# Patient Record
Sex: Male | Born: 1945 | Race: White | Hispanic: No | Marital: Single | Smoking: Former smoker
Health system: Southern US, Community
[De-identification: ages and names within clinical notes are randomized; demographics above are authoritative.]

## PROBLEM LIST (undated history)

## (undated) DIAGNOSIS — D649 Anemia, unspecified: Secondary | ICD-10-CM

## (undated) DIAGNOSIS — G629 Polyneuropathy, unspecified: Secondary | ICD-10-CM

## (undated) DIAGNOSIS — E785 Hyperlipidemia, unspecified: Secondary | ICD-10-CM

## (undated) DIAGNOSIS — C9000 Multiple myeloma not having achieved remission: Secondary | ICD-10-CM

## (undated) DIAGNOSIS — T07XXXA Unspecified multiple injuries, initial encounter: Secondary | ICD-10-CM

## (undated) DIAGNOSIS — K219 Gastro-esophageal reflux disease without esophagitis: Secondary | ICD-10-CM

## (undated) DIAGNOSIS — Z5189 Encounter for other specified aftercare: Secondary | ICD-10-CM

## (undated) DIAGNOSIS — Z9221 Personal history of antineoplastic chemotherapy: Secondary | ICD-10-CM

## (undated) DIAGNOSIS — IMO0002 Reserved for concepts with insufficient information to code with codable children: Secondary | ICD-10-CM

## (undated) DIAGNOSIS — C61 Malignant neoplasm of prostate: Secondary | ICD-10-CM

## (undated) DIAGNOSIS — I10 Essential (primary) hypertension: Secondary | ICD-10-CM

## (undated) DIAGNOSIS — C801 Malignant (primary) neoplasm, unspecified: Secondary | ICD-10-CM

## (undated) DIAGNOSIS — N08 Glomerular disorders in diseases classified elsewhere: Secondary | ICD-10-CM

## (undated) DIAGNOSIS — N289 Disorder of kidney and ureter, unspecified: Secondary | ICD-10-CM

## (undated) HISTORY — DX: Malignant (primary) neoplasm, unspecified: C80.1

## (undated) HISTORY — DX: Personal history of antineoplastic chemotherapy: Z92.21

## (undated) HISTORY — DX: Essential (primary) hypertension: I10

## (undated) HISTORY — DX: Polyneuropathy, unspecified: G62.9

## (undated) HISTORY — DX: Reserved for concepts with insufficient information to code with codable children: IMO0002

## (undated) HISTORY — DX: Anemia, unspecified: D64.9

## (undated) HISTORY — DX: Unspecified multiple injuries, initial encounter: T07.XXXA

## (undated) HISTORY — DX: Disorder of kidney and ureter, unspecified: N28.9

## (undated) HISTORY — DX: Encounter for other specified aftercare: Z51.89

## (undated) HISTORY — DX: Gastro-esophageal reflux disease without esophagitis: K21.9

## (undated) HISTORY — PX: CATARACT EXTRACTION W/ INTRAOCULAR LENS IMPLANT: SHX1309

## (undated) HISTORY — DX: Malignant neoplasm of prostate: C61

## (undated) HISTORY — PX: KIDNEY STONE SURGERY: SHX686

---

## 2002-03-27 DIAGNOSIS — C61 Malignant neoplasm of prostate: Secondary | ICD-10-CM

## 2002-03-27 HISTORY — DX: Malignant neoplasm of prostate: C61

## 2006-08-02 ENCOUNTER — Ambulatory Visit: Payer: Self-pay | Admitting: Family Medicine

## 2006-08-05 ENCOUNTER — Ambulatory Visit: Payer: Self-pay | Admitting: Family Medicine

## 2006-08-05 LAB — CONVERTED CEMR LAB
Chol/HDL Ratio, serum: 5.5
Cholesterol: 181 mg/dL (ref 0–200)
PSA: 4.62 ng/mL
PSA: 4.62 ng/mL — ABNORMAL HIGH (ref 0.10–4.00)
VLDL: 35 mg/dL (ref 0–40)
Vitamin B-12: 334 pg/mL (ref 211–911)

## 2006-08-19 ENCOUNTER — Ambulatory Visit: Payer: Self-pay | Admitting: Family Medicine

## 2006-09-07 ENCOUNTER — Ambulatory Visit: Payer: Self-pay | Admitting: Gastroenterology

## 2006-09-17 ENCOUNTER — Encounter (INDEPENDENT_AMBULATORY_CARE_PROVIDER_SITE_OTHER): Payer: Self-pay | Admitting: Specialist

## 2006-09-17 ENCOUNTER — Ambulatory Visit: Payer: Self-pay | Admitting: Gastroenterology

## 2006-11-18 ENCOUNTER — Encounter: Payer: Self-pay | Admitting: Family Medicine

## 2006-11-18 DIAGNOSIS — K219 Gastro-esophageal reflux disease without esophagitis: Secondary | ICD-10-CM | POA: Insufficient documentation

## 2006-11-18 DIAGNOSIS — I10 Essential (primary) hypertension: Secondary | ICD-10-CM | POA: Insufficient documentation

## 2006-11-18 DIAGNOSIS — E669 Obesity, unspecified: Secondary | ICD-10-CM

## 2006-11-22 ENCOUNTER — Ambulatory Visit: Payer: Self-pay | Admitting: Family Medicine

## 2006-11-24 ENCOUNTER — Encounter: Payer: Self-pay | Admitting: Family Medicine

## 2006-11-24 LAB — CONVERTED CEMR LAB: PSA, Free Pct: 16 — ABNORMAL LOW (ref >25)

## 2006-11-25 ENCOUNTER — Encounter: Payer: Self-pay | Admitting: Family Medicine

## 2006-11-26 ENCOUNTER — Encounter: Payer: Self-pay | Admitting: Family Medicine

## 2006-12-17 ENCOUNTER — Encounter: Payer: Self-pay | Admitting: Family Medicine

## 2007-01-04 ENCOUNTER — Encounter: Payer: Self-pay | Admitting: Family Medicine

## 2007-01-04 ENCOUNTER — Ambulatory Visit (HOSPITAL_COMMUNITY): Admission: RE | Admit: 2007-01-04 | Discharge: 2007-01-04 | Payer: Self-pay | Admitting: Urology

## 2007-01-10 ENCOUNTER — Encounter: Payer: Self-pay | Admitting: Family Medicine

## 2007-01-20 ENCOUNTER — Ambulatory Visit: Admission: RE | Admit: 2007-01-20 | Discharge: 2007-03-01 | Payer: Self-pay | Admitting: Radiation Oncology

## 2007-01-24 ENCOUNTER — Encounter: Payer: Self-pay | Admitting: Family Medicine

## 2007-02-11 ENCOUNTER — Encounter: Payer: Self-pay | Admitting: Family Medicine

## 2007-02-23 DIAGNOSIS — C61 Malignant neoplasm of prostate: Secondary | ICD-10-CM

## 2007-04-06 ENCOUNTER — Inpatient Hospital Stay (HOSPITAL_COMMUNITY): Admission: RE | Admit: 2007-04-06 | Discharge: 2007-04-07 | Payer: Self-pay | Admitting: Urology

## 2007-04-06 ENCOUNTER — Encounter (INDEPENDENT_AMBULATORY_CARE_PROVIDER_SITE_OTHER): Payer: Self-pay | Admitting: Urology

## 2007-04-06 HISTORY — PX: PROSTATECTOMY: SHX69

## 2007-04-12 ENCOUNTER — Encounter: Payer: Self-pay | Admitting: Family Medicine

## 2007-05-20 ENCOUNTER — Encounter: Payer: Self-pay | Admitting: Family Medicine

## 2007-05-26 ENCOUNTER — Ambulatory Visit: Admission: RE | Admit: 2007-05-26 | Discharge: 2007-07-04 | Payer: Self-pay | Admitting: Radiation Oncology

## 2007-05-30 ENCOUNTER — Encounter: Payer: Self-pay | Admitting: Family Medicine

## 2007-08-05 ENCOUNTER — Encounter: Payer: Self-pay | Admitting: Family Medicine

## 2007-08-15 ENCOUNTER — Ambulatory Visit: Payer: Self-pay | Admitting: Family Medicine

## 2007-08-16 ENCOUNTER — Ambulatory Visit: Payer: Self-pay | Admitting: Family Medicine

## 2007-08-18 DIAGNOSIS — E781 Pure hyperglyceridemia: Secondary | ICD-10-CM

## 2007-08-18 LAB — CONVERTED CEMR LAB
ALT: 30 units/L (ref 0–53)
AST: 28 units/L (ref 0–37)
BUN: 15 mg/dL (ref 6–23)
Bilirubin, Direct: 0.1 mg/dL (ref 0.0–0.3)
Cholesterol: 178 mg/dL (ref 0–200)
GFR calc non Af Amer: 104 mL/min
HDL: 29.5 mg/dL — ABNORMAL LOW (ref >39.0)
Potassium: 4.8 meq/L (ref 3.5–5.1)
Total Bilirubin: 1.1 mg/dL (ref 0.3–1.2)
Total CHOL/HDL Ratio: 6
Total Protein: 6.7 g/dL (ref 6.0–8.3)
Triglycerides: 240 mg/dL (ref 0–149)
VLDL: 48 mg/dL — ABNORMAL HIGH (ref 0–40)

## 2007-11-01 ENCOUNTER — Encounter: Payer: Self-pay | Admitting: Family Medicine

## 2007-11-16 ENCOUNTER — Ambulatory Visit: Payer: Self-pay | Admitting: Family Medicine

## 2007-11-18 LAB — CONVERTED CEMR LAB
Direct LDL: 124.3 mg/dL
Total CHOL/HDL Ratio: 6.2
VLDL: 44 mg/dL — ABNORMAL HIGH (ref 0–40)

## 2007-11-21 ENCOUNTER — Telehealth: Payer: Self-pay | Admitting: Family Medicine

## 2008-02-01 ENCOUNTER — Encounter: Payer: Self-pay | Admitting: Family Medicine

## 2008-02-15 ENCOUNTER — Ambulatory Visit: Payer: Self-pay | Admitting: Family Medicine

## 2008-02-16 LAB — CONVERTED CEMR LAB
HDL: 26.3 mg/dL — ABNORMAL LOW (ref >39.0)
LDL Cholesterol: 100 mg/dL — ABNORMAL HIGH (ref 0–99)
Triglycerides: 153 mg/dL — ABNORMAL HIGH (ref 0–149)
VLDL: 31 mg/dL (ref 0–40)

## 2008-03-23 ENCOUNTER — Encounter: Payer: Self-pay | Admitting: Family Medicine

## 2008-05-17 ENCOUNTER — Ambulatory Visit: Payer: Self-pay | Admitting: Family Medicine

## 2008-05-17 DIAGNOSIS — R209 Unspecified disturbances of skin sensation: Secondary | ICD-10-CM | POA: Insufficient documentation

## 2008-05-17 DIAGNOSIS — M79609 Pain in unspecified limb: Secondary | ICD-10-CM

## 2008-05-17 DIAGNOSIS — R109 Unspecified abdominal pain: Secondary | ICD-10-CM | POA: Insufficient documentation

## 2008-05-27 LAB — CONVERTED CEMR LAB
PSA: 0 ng/mL
PSA: 0 ng/mL

## 2008-06-08 ENCOUNTER — Encounter: Payer: Self-pay | Admitting: Family Medicine

## 2008-07-03 ENCOUNTER — Encounter: Payer: Self-pay | Admitting: Family Medicine

## 2008-07-03 ENCOUNTER — Encounter: Admission: RE | Admit: 2008-07-03 | Discharge: 2008-07-03 | Payer: Self-pay | Admitting: Neurology

## 2008-07-09 ENCOUNTER — Encounter: Payer: Self-pay | Admitting: Family Medicine

## 2008-08-10 ENCOUNTER — Ambulatory Visit: Payer: Self-pay | Admitting: Family Medicine

## 2008-08-14 LAB — CONVERTED CEMR LAB
ALT: 33 units/L (ref 0–53)
AST: 31 units/L (ref 0–37)
Cholesterol: 187 mg/dL (ref 0–200)
Total CHOL/HDL Ratio: 6.2

## 2008-08-15 ENCOUNTER — Ambulatory Visit: Payer: Self-pay | Admitting: Family Medicine

## 2008-09-11 ENCOUNTER — Encounter: Payer: Self-pay | Admitting: Family Medicine

## 2008-10-01 ENCOUNTER — Encounter: Payer: Self-pay | Admitting: Family Medicine

## 2008-10-18 ENCOUNTER — Encounter: Payer: Self-pay | Admitting: Family Medicine

## 2008-10-24 ENCOUNTER — Ambulatory Visit: Payer: Self-pay | Admitting: Cardiology

## 2008-12-14 ENCOUNTER — Encounter: Payer: Self-pay | Admitting: Family Medicine

## 2009-01-23 ENCOUNTER — Encounter: Payer: Self-pay | Admitting: Gastroenterology

## 2009-01-23 ENCOUNTER — Encounter: Payer: Self-pay | Admitting: Family Medicine

## 2009-01-29 ENCOUNTER — Encounter: Payer: Self-pay | Admitting: Gastroenterology

## 2009-01-29 DIAGNOSIS — R198 Other specified symptoms and signs involving the digestive system and abdomen: Secondary | ICD-10-CM

## 2009-01-31 ENCOUNTER — Ambulatory Visit: Payer: Self-pay | Admitting: Gastroenterology

## 2009-01-31 ENCOUNTER — Ambulatory Visit (HOSPITAL_COMMUNITY): Admission: RE | Admit: 2009-01-31 | Discharge: 2009-01-31 | Payer: Self-pay | Admitting: Gastroenterology

## 2009-03-04 ENCOUNTER — Encounter: Payer: Self-pay | Admitting: Family Medicine

## 2009-03-05 ENCOUNTER — Telehealth: Payer: Self-pay | Admitting: Family Medicine

## 2009-03-05 ENCOUNTER — Telehealth: Payer: Self-pay | Admitting: Gastroenterology

## 2009-03-07 ENCOUNTER — Encounter: Payer: Self-pay | Admitting: Family Medicine

## 2009-03-07 ENCOUNTER — Encounter: Admission: RE | Admit: 2009-03-07 | Discharge: 2009-03-07 | Payer: Self-pay | Admitting: General Surgery

## 2009-03-12 ENCOUNTER — Telehealth (INDEPENDENT_AMBULATORY_CARE_PROVIDER_SITE_OTHER): Payer: Self-pay | Admitting: *Deleted

## 2009-03-25 ENCOUNTER — Encounter: Payer: Self-pay | Admitting: Family Medicine

## 2009-05-08 ENCOUNTER — Encounter: Payer: Self-pay | Admitting: Family Medicine

## 2009-05-09 ENCOUNTER — Ambulatory Visit (HOSPITAL_COMMUNITY): Admission: RE | Admit: 2009-05-09 | Discharge: 2009-05-09 | Payer: Self-pay | Admitting: General Surgery

## 2009-05-13 ENCOUNTER — Telehealth: Payer: Self-pay | Admitting: Family Medicine

## 2009-05-22 ENCOUNTER — Ambulatory Visit: Payer: Self-pay | Admitting: Family Medicine

## 2009-05-22 DIAGNOSIS — J029 Acute pharyngitis, unspecified: Secondary | ICD-10-CM

## 2009-05-23 ENCOUNTER — Encounter: Payer: Self-pay | Admitting: Family Medicine

## 2009-06-24 ENCOUNTER — Encounter: Payer: Self-pay | Admitting: Family Medicine

## 2009-06-26 HISTORY — PX: KIDNEY STONE SURGERY: SHX686

## 2009-07-05 ENCOUNTER — Encounter: Payer: Self-pay | Admitting: Family Medicine

## 2009-11-18 ENCOUNTER — Encounter: Payer: Self-pay | Admitting: Family Medicine

## 2009-12-06 ENCOUNTER — Encounter: Payer: Self-pay | Admitting: Family Medicine

## 2009-12-19 ENCOUNTER — Encounter: Payer: Self-pay | Admitting: Family Medicine

## 2010-01-03 ENCOUNTER — Encounter: Payer: Self-pay | Admitting: Family Medicine

## 2010-02-02 ENCOUNTER — Encounter: Payer: Self-pay | Admitting: Family Medicine

## 2010-02-03 ENCOUNTER — Telehealth: Payer: Self-pay | Admitting: Family Medicine

## 2010-02-04 ENCOUNTER — Ambulatory Visit: Payer: Self-pay | Admitting: Family Medicine

## 2010-02-04 DIAGNOSIS — H8110 Benign paroxysmal vertigo, unspecified ear: Secondary | ICD-10-CM

## 2010-07-11 ENCOUNTER — Encounter: Payer: Self-pay | Admitting: Family Medicine

## 2010-07-15 ENCOUNTER — Ambulatory Visit: Payer: Self-pay | Admitting: Family Medicine

## 2010-07-15 DIAGNOSIS — J309 Allergic rhinitis, unspecified: Secondary | ICD-10-CM | POA: Insufficient documentation

## 2010-07-17 ENCOUNTER — Telehealth (INDEPENDENT_AMBULATORY_CARE_PROVIDER_SITE_OTHER): Payer: Self-pay | Admitting: *Deleted

## 2010-07-27 DIAGNOSIS — D649 Anemia, unspecified: Secondary | ICD-10-CM

## 2010-07-27 HISTORY — DX: Anemia, unspecified: D64.9

## 2010-08-26 NOTE — Assessment & Plan Note (Signed)
Summary: DIZZINESS/HMW   Vital Signs:  Patient profile:   65 year old male Height:      74 inches Weight:      269.25 pounds BMI:     34.69 Temp:     98.3 degrees F oral Pulse rate:   72 / minute Pulse rhythm:   regular BP sitting:   142 / 80  (left arm) Cuff size:   large  Vitals Entered By: Linde Gillis CMA Duncan Dull) (February 04, 2010 3:52 PM) CC: dizziness   History of Present Illness: In last week...surgery to remove foreign body in perineum.  Outpt surgery. Feels dramatically better since surgery. Surgeon was Dr. Pixie Casino, specialist in [place].  Given percocet for pain...took some...when woke up next day felt dizzy. Resolved after few hours. Returned again.. felt off balance when turning quickly.  Slight headache. Slight nausea , vomiting. No neuro symptoms. No vision change.  No falls. No hearing loss, no ringing in ear.           Problems Prior to Update: 1)  Exudative Pharyngitis  (ICD-462) 2)  Rectal Mass  (ICD-787.99) 3)  Well Adult  (ICD-V70.0) 4)  Paresthesia  (ICD-782.0) 5)  Foot Pain, Bilateral  (ICD-729.5) 6)  Inguinal Pain, Right  (ICD-789.09) 7)  Hypertriglyceridemia  (ICD-272.1) 8)  Screening For Lipoid Disorders  (ICD-V77.91) 9)  Neop, Malignant, Prostate  (ICD-185) 10)  Obesity  (ICD-278.00) 11)  Hypertension  (ICD-401.9) 12)  Gerd  (ICD-530.81)  Current Medications (verified): 1)  Aspirin Low Strength 81 Mg Chew (Aspirin) .... Take 1 Tablet By Mouth Once A Day 2)  Hydrochlorothiazide 12.5 Mg Tabs (Hydrochlorothiazide) .... Take 1 Tablet By Mouth Once A Day 3)  Multivitamins   Tabs (Multiple Vitamin) .... Take 1 Tablet By Mouth Once A Day 4)  Simvastatin 40 Mg Tabs (Simvastatin) .... ? Dosage   Takes 1/2 Tablet By Mouth At Bedtime 5)  Meclizine Hcl 25 Mg Tabs (Meclizine Hcl) .Marland Kitchen.. 1 Tab By Mouth Q6 Hours As Needed Vertigo  Allergies (verified): No Known Drug Allergies  Past History:  Past medical, surgical, family and social  histories (including risk factors) reviewed, and no changes noted (except as noted below).  Past Medical History: Reviewed history from 11/18/2006 and no changes required. GERD Hypertension  Past Surgical History: Reviewed history from 08/15/2007 and no changes required. Kidney stones Radical prostatectomy 04/06/07 Bone Scan neg  Family History: Reviewed history and no changes required.  Social History: Reviewed history from 08/15/2007 and no changes required. Regular exercise-yes, daily walking Diet: healthy  Review of Systems General:  Denies fatigue and fever. CV:  Denies chest pain or discomfort. Resp:  Denies shortness of breath. GI:  Denies abdominal pain. Neuro:  Complains of poor balance and sensation of room spinning; denies falling down, memory loss, and seizures.  Physical Exam  General:  Well-developed,well-nourished,in no acute distress; alert,appropriate and cooperative throughout examination Eyes:  No corneal or conjunctival inflammation noted. EOMI. Perrla. Funduscopic exam benign, without hemorrhages, exudates or papilledema. Vision grossly normal. Ears:  External ear exam shows no significant lesions or deformities.  Otoscopic examination reveals clear canals, tympanic membranes are intact bilaterally without bulging, retraction, inflammation or discharge. Hearing is grossly normal bilaterally. Nose:  External nasal examination shows no deformity or inflammation. Nasal mucosa are pink and moist without lesions or exudates. Mouth:  Oral mucosa and oropharynx without lesions or exudates.  Teeth in good repair. Neck:  no carotid bruit or thyromegaly no cervical or supraclavicular lymphadenopathy  Lungs:  Normal respiratory effort, chest expands symmetrically. Lungs are clear to auscultation, no crackles or wheezes. Heart:  Normal rate and regular rhythm. S1 and S2 normal without gallop, murmur, click, rub or other extra sounds. Neurologic:  No cranial nerve  deficits noted. Station and gait are normal. DTRs are symmetrical throughout. Sensory, motor and coordinative functions appear intact. Nystagmus on Dix-HAllpike exam.    Impression & Recommendations:  Problem # 1:  BENIGN POSITIONAL VERTIGO (ICD-386.11) Assessment Unchanged Nml neuro exam His updated medication list for this problem includes:    Meclizine Hcl 25 Mg Tabs (Meclizine hcl) .Marland Kitchen... 1 tab by mouth q6 hours as needed vertigo  Demonstrated maneuvers to self-treat vertigo. Patient to call to be seen if no improvement in 10-14 days, sooner if worse.   Problem # 2:  RECTAL MASS (ICD-787.99) Foreign body successfully removed.  Complete Medication List: 1)  Aspirin Low Strength 81 Mg Chew (Aspirin) .... Take 1 tablet by mouth once a day 2)  Hydrochlorothiazide 12.5 Mg Tabs (Hydrochlorothiazide) .... Take 1 tablet by mouth once a day 3)  Multivitamins Tabs (Multiple vitamin) .... Take 1 tablet by mouth once a day 4)  Simvastatin 40 Mg Tabs (Simvastatin) .... ? dosage   takes 1/2 tablet by mouth at bedtime 5)  Meclizine Hcl 25 Mg Tabs (Meclizine hcl) .Marland Kitchen.. 1 tab by mouth q6 hours as needed vertigo  Patient Instructions: 1)  [facility] [place] [facility]. 2)   Do home BPPV exercises.  3)   Use meclizine for severe vertigo. Call if not resolved in 2-3 weeks. Prescriptions: MECLIZINE HCL 25 MG TABS (MECLIZINE HCL) 1 tab by mouth q6 hours as needed vertigo  #20 x 0   Entered and Authorized by:   [name] MD   Signed by:   [name] MD on 02/04/2010   Method used:   Print then Give to Patient   RxID:   [id number]   Current Allergies (reviewed today): No known allergies   Hemoccult Result Date:  02/04/2010 Hemoccult Result:  normal at [facility] Hemoccult Next Due:  1 yr Last PSA Result:  0 (05/27/2008 3:30:38 PM) PSA Result Date:  11/24/2009 PSA Result:  per Dr. Laverle Patter PSA Next Due:  6 mo

## 2010-08-26 NOTE — Progress Notes (Signed)
Summary: Picture of Foreign Body Removed During Surgery  Picture of Foreign Body Removed During Surgery   Imported By: Lanelle Bal 02/14/2010 12:24:27  _____________________________________________________________________  External Attachment:    Type:   Image     Comment:   External Document

## 2010-08-26 NOTE — Progress Notes (Signed)
Summary: call a nurse  Phone Note Call from Patient   Caller: Patient Call For: [name] MD Summary of Call: Triage Record Num: [id number] Operator: Alphonsa Overall Patient Name: [name] Call Date & Time: 02/02/2010 2:22:34PM Patient Phone: [phone] PCP: [name] Patient Gender: Male PCP Fax : Patient DOB: [birth year] Practice Name: [facility] Reason for Call: S/P rectal surgery 01/30/10 to have staple removed from prostate surgery yrs ago. [name] calling about balance changes. Onset 01/31/10. Symptom worsens with quick movements or turning head quickly. No weaknesses or tingling. [name] will call MD office in am for assessment. Home care advice given. Protocol(s) Used: Dizziness / Vertigo Recommended Outcome per Protocol: See Provider within 24 hours Reason for Outcome: Symptoms worsen with movement of head or looking up AND no other neurologic symptoms Care Advice: Call EMS 911 if patient develops confusion, decreased level of consciousness, chest pain, shortness of breath, or focal neurologic abnormalities such as facial droop or weakness of one extremity.  ~  ~ DO NOT drive until condition evaluated.  ~ Avoid movements that cause symptoms.  ~ List, or take, all current prescription(s), OTC or alternative medication(s) to provider for evaluation. A temporary drop in blood pressure sometimes occurs with a quick change to an upright position (postural hypotension). Making a habit of rising slowly and sitting a minute before walking usually relieves the feeling of faintness. Quick position changes may cause lightheadedness.  ~ Avoid caffeine (coffee, tea, cola drinks, or chocolate), alcohol, and nicotine (use of tobacco), as use of these substances may worsen symptoms.  ~  ~ Call provider immediately if have difficulty walking, vision problems, or weakness. When feeling faint, find a place to lie down if possible, and elevate your legs 8 to 12 inches above the heart.  If unable to lie down, sit in a chair and lower head between the knees for 3 to 5 minutes. Slowly resume sitting position as faintness clears.  ~  ~ SYMPTOM / CONDITION MANAGEM Initial call taken by: Melody Comas,  February 03, 2010 9:37 AM  Follow-up for Phone Call        noted - needs to be addressed by surgeon. Follow-up by: Hannah Beat MD,  February 03, 2010 9:46 AM

## 2010-08-26 NOTE — Letter (Signed)
Summary: [facility]   Imported By: Lanelle Bal 02/17/2010 11:51:04  _____________________________________________________________________  External Attachment:    Type:   Image     Comment:   External Document

## 2010-08-26 NOTE — Letter (Signed)
Summary: [facility] Triage Call Report  [facility] Triage Call Report   Imported By: Beau Fanny 02/19/2010 10:07:00  _____________________________________________________________________  External Attachment:    Type:   Image     Comment:   External Document

## 2010-08-26 NOTE — Miscellaneous (Signed)
Summary: Health Care Power of [place] and Living Will  Health Care Power of Attorney and Living Will   Imported By: Beau Fanny 02/05/2010 09:19:29  _____________________________________________________________________  External Attachment:    Type:   Image     Comment:   External Document

## 2010-08-26 NOTE — Letter (Signed)
Summary: [facility]   Imported By: Lester Bethania 01/20/2010 07:39:46  _____________________________________________________________________  External Attachment:    Type:   Image     Comment:   External Document

## 2010-08-26 NOTE — Letter (Signed)
Summary: [facility]   Imported By: Lanelle Bal 01/01/2010 08:52:04  _____________________________________________________________________  External Attachment:    Type:   Image     Comment:   External Document

## 2010-08-28 NOTE — Progress Notes (Signed)
  Phone Note Call from Patient   Summary of Call: Symptoms were on the left side today but when he came in on Tuesday they were on the right side.  He is a little better but not alot.  He would like to know what to do now????  He felt better Tuesday night and is now feeling worse.  Phone:  [phone] Initial call taken by: Rosine Beat,  July 17, 2010 11:47 AM  Follow-up for Phone Call        Pt. was advised to continue the medication that he was prescribed and continue the instructions that Dr. Laural Benes had given him. Pt. was also advised that if he isn't any better in 4-5 days? He can return to the clinic or see his Primary Care for re-evaluation. Follow-up by: Levonne Spiller EMT-P,  July 17, 2010 12:22 PM

## 2010-08-28 NOTE — Letter (Signed)
Summary: [facility]   Imported By: Maryln Gottron 08/18/2010 13:47:11  _____________________________________________________________________  External Attachment:    Type:   Image     Comment:   External Document

## 2010-08-28 NOTE — Assessment & Plan Note (Signed)
Summary: COUGH AND CONGESTION/EVM   Vital Signs:  Patient Profile:   65 Years Old Male CC:      Cold & URI symptoms Height:     74.25 inches (187.96 cm) Weight:      273 pounds BMI:     34.94 O2 Sat:      97 % O2 treatment:    Room Air Temp:     97.0 degrees F oral Pulse rate:   87 / minute Pulse rhythm:   regular Resp:     18 per minute BP sitting:   150 / 92  (right arm)  Pt. in pain?   yes    Location:   head    Intensity:   6    Type:       aching  Vitals Entered By: Levonne Spiller EMT-P (July 15, 2010 1:40 PM)              Is Patient Diabetic? No  Does patient need assistance? Functional Status Self care Ambulation Normal      Current Allergies: No known allergies History of Present Illness History from: patient Reason for visit: see chief complaint Chief Complaint: Cold & URI symptoms History of Present Illness: The patient is presenting today because he has been experiencing pain in the sinuses and postnasal drainage and reporting that he is having some allergy symptoms of watery eyes and runny nose. He is having a clear runny nasal discharge and reports that he's having some postnasal sinus drainage and pressure in the maxillary and frontal sinuses. He reports that his symptoms have been present for the past week but has been worse in the past 2 days. The patient reports that he has been taking over-the-counter decongestants and reports that he believes that this is why his blood pressure is been elevated. He reports that he was told that he's not supposed to take decongestants but he did take some for the past 3 days. He is denying chest pain or shortness of breath and headache. He is having no fever or chills and no nausea vomiting or diarrhea. he reports no sneezing at this time. He denies cough.  Patient says that he did have his flu vaccine this season.  REVIEW OF SYSTEMS Constitutional Symptoms      Denies fever, chills, night sweats, weight loss, weight  gain, and fatigue.  Eyes       Complains of eye drainage.      Denies change in vision, eye pain, glasses, contact lenses, and eye surgery. Ear/Nose/Throat/Mouth       Complains of frequent runny nose and sinus problems.      Denies hearing loss/aids, change in hearing, ear pain, ear discharge, dizziness, frequent nose bleeds, sore throat, hoarseness, and tooth pain or bleeding.  Respiratory       Denies dry cough, productive cough, wheezing, shortness of breath, asthma, bronchitis, and emphysema/COPD.  Cardiovascular       Denies murmurs, chest pain, and tires easily with exhertion.    Gastrointestinal       Denies stomach pain, nausea/vomiting, diarrhea, constipation, blood in bowel movements, and indigestion. Genitourniary       Denies painful urination, kidney stones, and loss of urinary control. Neurological       Complains of headaches.      Denies paralysis, seizures, and fainting/blackouts. Musculoskeletal       Denies muscle pain, joint pain, joint stiffness, decreased range of motion, redness, swelling, muscle weakness, and gout.  Skin  Denies bruising, unusual mles/lumps or sores, and hair/skin or nail changes.  Psych       Denies mood changes, temper/anger issues, anxiety/stress, speech problems, depression, and sleep problems.  Past History:  Family History: Last updated: 08-13-2010 mother died of pneumonia Father suffered with a stroke died at the age of 37 Brother deceased from lung cancer at the age of 47 Sister had bladder cancer - alive at age 2  Social History: Last updated: 13-Aug-2010 Regular exercise-yes, daily walking Diet: healthy patient reports alcohol consumption of 5 beers per week. Patient denies tobacco and recreational drug use. 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[facility]  Risk Factors: Exercise: yes (08/15/2007)  Risk Factors: Smoking Status: quit (11/18/2006)  Past Medical  History: GERD Hypertension Allergic rhinitis  Past Surgical History: Kidney stones Radical prostatectomy 04/06/07 Bone Scan neg kidney stone removed from bladder 05-Aug-2009 Family History: mother died of pneumonia Father suffered with a stroke died at the age of 29 Brother deceased from lung cancer at the age of 34 Sister had bladder cancer - alive at age 64  Social History: Regular exercise-yes, daily walking Diet: healthy patient reports alcohol consumption of 5 beers per week. Patient denies tobacco and recreational drug use. 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[employer] Physical Exam General appearance: well developed, well nourished, no acute distress Head: normocephalic, atraumatic Eyes: conjunctivae and lids normal Pupils: equal, round, reactive to light Ears: normal, no lesions or deformities Nasal: swollen red turbinates with congestion, turbinates kissing Oral/Pharynx: tongue normal, posterior pharynx without erythema or exudate Neck: neck supple,  trachea midline, no masses Chest/Lungs: no rales, wheezes, or rhonchi bilateral, breath sounds equal without effort Heart: regular rate and  rhythm, no murmur Abdomen: soft, non-tender without obvious organomegaly Extremities: normal extremities Neurological: grossly intact and non-focal Skin: no obvious rashes or lesions MSE: oriented to time, place, and person Assessment New Problems: ? of ACUTE SINUSITIS, UNSPECIFIED (ICD-461.9) ALLERGIC RHINITIS (ICD-477.9)   Patient Education: Patient and/or caregiver instructed in the following: rest. The risks, benefits and possible side effects were clearly explained and discussed with the patient.  The patient verbalized clear understanding.  The patient was given instructions to return if symptoms don't improve, worsen or new changes develop.  If it is not during clinic hours and the patient cannot get back to this clinic then  the patient was told to seek medical care at an available urgent care or emergency department.  The patient verbalized understanding.   Demonstrates willingness to comply.  Plan New Medications/Changes: AZITHROMYCIN 250 MG TABS (AZITHROMYCIN) take 2 tabs by mouth on day 1 then 1 tab by mouth daily for 4 days  #6 x 0, Aug 13, 2010, Clanford Canning MD FLUTICASONE PROPIONATE 50 MCG/ACT SUSP (FLUTICASONE PROPIONATE) 2 sprays per nostril once daily  #1 x 1, 2010/08/13, Clanford Manolis MD FEXOFENADINE HCL 180 MG TABS (FEXOFENADINE HCL) take 1 by mouth daily as needed for allergies  #14 x 0, 2010-08-13, Clanford Wiebelhaus MD  Follow Up: Follow up in 2-3 days if no improvement, Follow up on an as needed basis, Follow up with Primary Physician  The patient and/or caregiver has been counseled thoroughly with regard to medications prescribed including dosage, schedule, interactions, rationale for use, and possible side effects and they verbalize understanding.  Diagnoses and expected course of recovery discussed and will return if not improved as expected or if the condition worsens. Patient and/or caregiver verbalized understanding.  Prescriptions: AZITHROMYCIN 250 MG TABS (AZITHROMYCIN) take 2 tabs by mouth on day 1 then 1 tab by mouth daily for 4 days  #6 x 0   Entered and Authorized by:   Standley Dakins MD   Signed by:   Standley Dakins MD on 07/15/2010   Method used:   Electronically to        [facility] [address] (retail)       [address], [address]       [place], [place]  [address]       Ph: [phone]       Fax: [phone]   RxID:   [id number] FLUTICASONE PROPIONATE 50 MCG/ACT SUSP (FLUTICASONE PROPIONATE) 2 sprays per nostril once daily  #1 x 1   Entered and Authorized by:   Standley Dakins MD   Signed by:   Standley Dakins MD on 07/15/2010   Method used:   Electronically to        [facility] [address] (retail)       [address], [address]       [place], [place]  [address]       Ph: [phone]       Fax: [phone]   RxID:   [id number] FEXOFENADINE HCL 180 MG TABS (FEXOFENADINE HCL) take 1 by mouth daily as needed for allergies  #14 x 0   Entered and Authorized by:   Standley Dakins MD   Signed by:   Standley Dakins MD on 07/15/2010   Method used:   Electronically to        [facility] [address] (retail)       [address], [address]       [place], [place]  [address]       Ph: [phone]       Fax: [phone]   RxID:   [id number]   Patient Instructions: 1)  As we discussed in clinic today, if he start developing a bad taste in her mouth or worsening sinus pressure or drainage and tooth pain please go ahead and start the Zithromax antibiotics that we prescribed today. 2)  Please take her medications as prescribed. Give the Flonase several days before it starts to work fully. Also I recommend that you discontinue the Afrin nasal spray because if you use it longer than 3-5 days you can experience rebound congestion. 3)  I recommend that she have your blood pressure checked again in one to 2 days. You can come up to have it checked in our office as we discussed at any time that were open. I recommend that your blood pressure be less than 130/80. If your blood pressure is persistently over one 140/90 please see her primary care Indigo Chaddock or come back to this office. Go ahead and double up on your blood pressure medication. Take the hydrochlorothiazide 12.5 mg capsules take 2 daily for the next week to get her blood pressure under better control because she had been taking the decongestants. I recommended she discontinue the decongestants at this time. 4)  The patient was informed that there is no on-call Esias Mory or services available at this clinic during off-hours (when the clinic is closed).  If the patient developed a problem or concern that required  immediate attention, the patient was advised to go the the nearest available  urgent care or emergency department for medical care.  The patient verbalized understanding.

## 2010-08-28 NOTE — Letter (Signed)
Summary: [facility]   Imported By: Lanelle Bal 07/23/2010 15:07:46  _____________________________________________________________________  External Attachment:    Type:   Image     Comment:   External Document

## 2010-08-28 NOTE — Letter (Signed)
Summary: [facility]   Imported By: Maryln Gottron 08/18/2010 13:45:47  _____________________________________________________________________  External Attachment:    Type:   Image     Comment:   External Document

## 2010-08-28 NOTE — Letter (Signed)
Summary: [facility]   Imported By: Maryln Gottron 08/18/2010 13:44:08  _____________________________________________________________________  External Attachment:    Type:   Image     Comment:   External Document

## 2010-09-08 ENCOUNTER — Other Ambulatory Visit: Payer: Self-pay | Admitting: Family Medicine

## 2010-09-12 ENCOUNTER — Encounter: Payer: Self-pay | Admitting: Family Medicine

## 2010-09-30 ENCOUNTER — Encounter: Payer: Self-pay | Admitting: Family Medicine

## 2010-10-30 LAB — COMPREHENSIVE METABOLIC PANEL
Albumin: 4.1 g/dL (ref 3.5–5.2)
Chloride: 104 mEq/L (ref 96–112)
GFR calc Af Amer: >60 mL/min (ref >60)
GFR calc non Af Amer: >60 mL/min (ref >60)
Sodium: 142 mEq/L (ref 135–145)
Total Bilirubin: 1 mg/dL (ref 0.3–1.2)
Total Protein: 7.4 g/dL (ref 6.0–8.3)

## 2010-10-30 LAB — DIFFERENTIAL
Eosinophils Relative: 3 % (ref 0–5)
Monocytes Absolute: 0.8 10*3/uL (ref 0.1–1.0)
Monocytes Relative: 9 % (ref 3–12)
Neutro Abs: 4.9 10*3/uL (ref 1.7–7.7)
Neutrophils Relative %: 60 % (ref 43–77)

## 2010-10-30 LAB — CBC: WBC: 8.2 10*3/uL (ref 4.0–10.5)

## 2010-11-07 ENCOUNTER — Other Ambulatory Visit: Payer: Self-pay

## 2010-11-18 ENCOUNTER — Encounter: Payer: Self-pay | Admitting: Family Medicine

## 2010-12-09 NOTE — Op Note (Signed)
NAMETOD, ABRAHAMSEN               ACCOUNT NO.:  [id number]   MEDICAL RECORD NO.:  [id number]          PATIENT TYPE:  INP   LOCATION:  1415                         FACILITY:  [facility]   PHYSICIAN:  [name], MD      DATE OF BIRTH:  1945/12/07   DATE OF PROCEDURE:  04/06/2007  DATE OF DISCHARGE:                               OPERATIVE REPORT   PREOPERATIVE DIAGNOSIS:  Clinically localized adenocarcinoma of  prostate.   POSTOPERATIVE DIAGNOSIS:  Clinically localized adenocarcinoma of  prostate.   PROCEDURE:  1. Robotic assisted laparoscopic radical prostatectomy (non nerve      sparing).  2. Bilateral laparoscopic pelvic lymphadenectomy.   SURGEON:  Dr. [name].   ASSISTANT:  Gaynelle Arabian, M.D.   ANESTHESIA:  General.   COMPLICATIONS:  None.   ESTIMATED BLOOD LOSS:  150 mL.   SPECIMENS:  1. Prostate and seminal vesicles.  2. Right pelvic lymph nodes.  3. Left pelvic lymph nodes.   DISPOSITION OF SPECIMEN:  To pathology.   DRAINS:  1. 20-French straight catheter.  2. #19 Blake pelvic drain.   INDICATION:  Mr. 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  He underwent a CT scan and bone scan preoperatively which  did not demonstrate any obvious metastatic disease.  After discussing  the various options for treatment, the patient elected to proceed with  surgical therapy and the above procedures.  Potential risks,  complications and alternative options were discussed with the patient  and informed consent was obtained.   DESCRIPTION OF PROCEDURE:  The patient was taken to the operating room  and a general anesthetic was administered.  He was given preoperative  antibiotics, placed in the dorsal lithotomy position, prepped and draped  in the usual sterile fashion.  Next a preoperative time-out was  performed.  A Foley catheter was inserted into the bladder.  A site  was  selected just to the left of the umbilicus for placement of the camera  port.  This was placed using a standard open Hasson technique.  This  allowed entry into the peritoneal cavity under direct vision without  difficulty.  A 12 mm port was then placed and a pneumoperitoneum was  established.  A 0 degrees lens was then used to inspect the abdomen and  there was no evidence of any intra-abdominal injuries or other  abnormalities.  The remaining ports were then placed.  Bilateral 8 mm  robotic ports were placed 10 cm lateral to the camera port and just  inferior to the camera port.  An additional 8 mm robotic port was placed  in the far left lateral abdominal wall.  A 5 mm port was placed between  the camera port and right robotic port.  An additional 12 mm port was  placed in the far right lateral abdominal wall for laparoscopic  assistance.  All ports placed under direct vision and without  difficulty.  The surgical cart was then docked.  With the aid of the  cautery scissors, the bladder was reflected posteriorly, allowing entry  in the space of Retzius and identification of the endopelvic fascia and  prostate.  The endopelvic fascia was then incised from the apex back to  the base of prostate bilaterally and the underlying levator muscle  fibers were swept laterally off the prostate.  This isolated the dorsal  venous complex which was then stapled and divided with a 45 mm flex ETS  stapler.  The bladder neck was then identified with the aid of Foley  catheter manipulation and divided anteriorly allowing the catheter to be  identified.  The catheter balloon was deflated and the catheter was  brought into the operative field and used to retract the prostate  anteriorly.  This exposed the posterior bladder neck which was divided  and dissection then continued between the bladder neck and prostate  until the vasa deferentia and seminal vesicles were identified.  The  vasa deferentia  were isolated, divided and lifted anteriorly.  The  seminal vesicles were then dissected down to their tips with care to  control seminal vesicle arterial blood supply.  The seminal vesicles  were then lifted anteriorly and the space between Denonvilliers fascia  and the anterior rectum was bluntly developed thereby isolating the  vascular pedicles of prostate.  The vascular pedicles of the prostate  were then ligated with Hem-o-lok clips and a wide non nerve sparing  dissection was performed allowing the periprostatic tissue to be removed  with the prostate.  The urethra was then identified and sharply divided  allowing the prostate specimen to be disarticulated.  The pelvis was  then copiously irrigated and hemostasis was ensured.  There was no  evidence of a rectal injury.  Attention then turned to the right pelvic  sidewall.  The fibrofatty tissue between the external iliac vein,  confluence of the iliac vessels, hypogastric artery, and Cooper's  ligament was dissected free from the pelvic sidewall with care to  preserve the obturator nerve.  Hemoclips were used for lymphostasis and  hemostasis.  This specimen was then passed off for permanent pathologic  analysis.  An identical procedure was then performed on the  contralateral side.  Attention then turned to the urethral anastomosis.  A 2-0 Vicryl suture was placed between the bladder neck and the urethra  to reapproximate these structures.  A double-armed 3-0 Monocryl suture  was then used to perform a 360 degrees running tension-free anastomosis  between the bladder neck and urethra.  A new 20-French coude catheter  was inserted into the bladder and irrigated.  There were no blood clots  within the bladder and the anastomosis appeared to be watertight.  A #19  Blake drain was then brought through the left robotic port and  appropriately positioned in the pelvis.  It was secured to skin with a  nylon suture.  The surgical cart was  then undocked.  The right lateral  12 mm port site was closed with a 0 Vicryl suture with the aid of suture  passer device.  The prostate specimen was removed intact within the  Endopouch retrieval bag via the periumbilical port site.  This fascial  opening was closed a running 0 Vicryl suture.  All remaining ports were  removed under direct vision.  All incision sites were then injected with  0.25% Marcaine and reapproximated at skin level with staples.  Sterile  dressings were applied.  The patient appeared to tolerate procedure well  without complications.  He was  able to be extubated and transferred to  recovery unit in satisfactory condition.      [name], MD  Electronically Signed     LB/MEDQ  D:  04/06/2007  T:  04/07/2007  Job:  [id number]

## 2010-12-09 NOTE — H&P (Signed)
[name], [name]               ACCOUNT NO.:  [id number]   MEDICAL RECORD NO.:  [id number]          PATIENT TYPE:  INP   LOCATION:  1415                         [facility]ACILITY:  [facility]   PHYSICIAN:  [name], MD      DATE OF BIRTH:  Dec 19, 1945   DATE OF ADMISSION:  04/06/2007  DATE OF DISCHARGE:                              HISTORY & PHYSICAL   CHIEF COMPLAINT:  Prostate cancer.   HISTORY:  [name] is a 65 year old gentleman with clinical stage T2C  versus T3 adenocarcinoma of the prostate with a PSA of 4.87 and Gleason  score of 4 + 3 equals 7.  He underwent a CT scan and bone scan and was  not noted to have any definite metastatic disease.  He underwent a  consultation with both myself and Dr. Kathrynn Running in radiation oncology and  after discussing options for his possibly locally advanced prostate  cancer, did elect to proceed with an aggressive surgical approach with  the understanding that he would be at high risk for needing adjuvant or  salvage therapy.   PAST MEDICAL HISTORY:  1. Hypertension.  2. Possible history of nephrolithiasis.   PAST SURGICAL HISTORY:  None.   MEDICATIONS:  1. Hydrochlorothiazide.  2. Aspirin which has been on hold.  3. Multivitamin which has been on hold.   ALLERGIES:  NO KNOWN DRUG ALLERGIES.   FAMILY HISTORY:  The patient's father does have a history of prostate  cancer, although died of other causes.   SOCIAL HISTORY:  The patient works as an Nature conservation officer.  He is  single but currently in a stable relationship.  He denies tobacco use  and drinks alcohol only occasionally.   REVIEW OF SYSTEMS:  A complete review of systems was performed and all  systems are reviewed and are negative.   PHYSICAL EXAMINATION:  CONSTITUTIONAL:  Well-nourished, well-developed,  age-appropriate male in no acute distress.  CARDIOVASCULAR:  Regular rate and rhythm without obvious murmurs.  LUNGS:  Clear bilaterally.  ABDOMEN:  Soft, nondistended,  nontender.  DRE:  The prostate does have significant nodularity along the lateral  aspect of the left side from the base to the apex with possible  extraprostatic extension.  He also has significant nodularity along the  lateral right aspect of the prostate, although this is significantly  less involved than the left-side of the prostate.   IMPRESSION:  High-risk clinically localized versus locally advanced  prostate cancer.   PLAN:  Mr. Keesling will undergo a wide non-nerve sparing robotic  prostatectomy and extended pelvic lymphadenectomy.      [name], MD  Electronically Signed     LB/MEDQ  D:  04/06/2007  T:  04/07/2007  Job:  [id number]

## 2010-12-12 NOTE — Assessment & Plan Note (Signed)
[facility]                           [place] OFFICE NOTE   NAME:JOHNSONChey, Cho                      MRN:          [id number]  DATE:08/02/2006                            DOB:          1946-05-25    New patient H&P.   CHIEF COMPLAINT:  A 65 year old white male here to establish new doctor.   HISTORY OF PRESENT ILLNESS:  Mr. Blasingame was seen recently at an Urgent  Care, approximately 1 week ago following a dizzy spell.  He was found to  have elevated blood pressure with diastolic in the 100s and systolic  around 409.  He was evaluated with an EKG and lab tests.  He was  initiated on hydrochlorothiazide.  He states that since that time, his  blood pressure has come down significantly and his symptoms have  improved.  He does also have the following concerns:  1. Rash:  There is an area that has been present next to his right eye      on the skin for the past 2 to 3 months.  It is a small lesion that      occasionally itches, appears red, sometimes flaky, but occasionally      has drained fluid.  He applies antibiotic ointment and it resolves.      His concern is because it continues to return off and on.  He does      have some history of severe sunburn in the past and sensitive skin.  2. Paresthesias, chronic:  Mr. Holness reports numbness and tingling      in his feet, on the bottom of his feet and in several of his toes.      He states that the sensation is as if his feet are very cold.  He      denies any chronic low back pain, numbness elsewhere, weakness in      any muscles.  He did have a diabetes evaluation at the Urgent Care      that showed no sign of diabetes.   REVIEW OF SYSTEMS:  Occasional shortness of breath with exertion, denies  chest pain, otherwise negative.   PAST MEDICAL HISTORY:  1. Hypertension.  2. GERD.   HOSPITALIZATIONS, SURGERIES, PROCEDURES:  Kidney stones passed on own.   ALLERGIES:  None.   MEDICATIONS:  1. Hydrochlorothiazide 12.5 mg daily.  2. Meclizine 25 mg 1 tablet p.o. q. 6 hr p.r.n. dizziness.  3. Omega-3 fatty acids 1 to 2 tablets daily.  4. Vitamin C 500 mg daily.  5. Multivitamin 1 tablet daily.  6. Aspirin 81 mg daily.  7. Heartburn relief medication 200 mg 1 p.o. q. a.m.   SOCIAL HISTORY:  A 50 pack year history, former smoker, quit 10 years  ago.  Light alcohol use with 2 to 4 drinks on weekends, but in past used  to drink 2 to 3 mixed drinks per day.  No past drug use.  He is an  Nature conservation officer, he is currently engaged to his girlfriend.  He has  not been previously married.  He has 1 son  and 1 daughter who are  healthy.  He gets daily exercise by walking.  He previously was eating  fatty foods, but since this high blood pressure diagnosis, he has tried  to eat fruits and vegetables more frequently.   FAMILY HISTORY:  Father deceased at age 63 with CVA and prostate cancer.  Mother deceased at age 16 with pneumonia.  No MI in the family before  age 41.  He has 1 brother who died at age 61 with stomach ulcer, 1  brother who died at age 38 with lung cancer.  He has a sister who is  living with kidney cancer and 1 of his brothers did also have diabetes.  There is no other family history of cancer.   PHYSICAL EXAMINATION:  VITAL SIGNS:  Height 74 inches, weight 253,  making BMI 30.  Blood pressure 142/78, pulse 84, temperature 97.9.  GENERAL:  A healthy appearing overweight male in no apparent distress.  HEENT:  PERRLA, extraocular muscles intact.  Oropharynx clear.  Tympanic  membranes clear.  Nares clear.  No thyromegaly, no lymphadenopathy.  Cervical or supraclavicular no  thyromegaly.  SKIN:  Dry, flaky, 0.25 mm erythematous dot on right side of face.  No  cyst palpated.  No increased warmth, no drainage.  CARDIOVASCULAR:  Regular rate and rhythm.  No murmurs, rubs or gallops.  Normal PMI.  2+ peripheral pulses, no peripheral edema.  PULMONARY:  Clear to  auscultation bilaterally.  No wheezes, rales or  rhonchi.  ABDOMEN:  Soft, nontender, normoactive bowel sounds.  No  hepatosplenomegaly.  MUSCULOSKELETAL:  Strength 5 out of 5 in upper and lower extremities.  NEURO:  Sensation intact in upper extremities.  Reflexes 2+ bilateral  patellar, decreased monofilament in bilateral second and third plantar  surface of toes on bilateral feet, otherwise sensation intact in  extremities.   ASSESSMENT AND PLAN:  1. Hypertension, moderate control:  Follow blood pressure at home and      purchase arm cuff.  Continue hydrochlorothiazide at 12.5 mg daily.      Contact me if blood pressure is above 140/80 more than 3      measurements in a row.  We will likely need to increase      hydrochlorothiazide.  Continue diet and exercise changes as well as      weight loss.  2. Rash, next to right eye:  This may be a small actinic keratosis      that heals frequently given the current appearance, but his      description does sound like a possible epidermoid cyst, but I do      not palpate anything right now.  He will return if the rash      returns.  3. Bilateral lower extremity paresthesias in second and third distal      digits.  Recommended stopping all alcohol use.  We will check a B12      and thyroid hormone.  He had a negative diabetes screen recently.      If his symptoms continue after stopping alcohol and evaluating with      these labs, he will return for an appointment.  4. Prevention:  He has not seen a doctor in many years, and is      therefore due for a cholesterol check as well as PSA screen.  We      will also set him up with a colonoscopy.  He was given a tetanus  vaccine today.  We did discuss evaluating his occasional shortness      of breath and      his 50 pack year history of smoking with pulmonary function test      and chest x-ray.  At this      point in time, we will hold off and will begin the other prevention     and then  we will follow up with these if his shortness of breath      does continue.     [name], MD  Electronically Signed    AB/MedQ  DD: 08/02/2006  DT: 08/03/2006  Job #: [id number]

## 2010-12-12 NOTE — Discharge Summary (Signed)
[name], [name]               ACCOUNT NO.:  [id number]   MEDICAL RECORD NO.:  [id number]          PATIENT TYPE:  INP   LOCATION:  1415                         FACILITY:  [facility]   PHYSICIAN:  [name], MD      DATE OF BIRTH:  05/10/1946   DATE OF ADMISSION:  04/06/2007  DATE OF DISCHARGE:  04/07/2007                               DISCHARGE SUMMARY   ADMISSION DIAGNOSIS:  Prostate cancer.   DISCHARGE DIAGNOSIS:  Prostate cancer.   HISTORY AND PHYSICAL:  For full details, please see admission history  and physical.   Briefly, [name] is a 65 year old gentleman with clinical stage T2c  versus T3 adenocarcinoma of the prostate with a PSA of 4.87 and a  Gleason score of 4+3 = 7.  His CT scan and bone scan were negative for  metastatic disease.  After discussing various management options of his  high risk disease, the patient elected to proceed with surgical therapy.   HOSPITAL COURSE:  On April 06, 2007, the patient was taken to the  operating room and underwent a robotic-assisted laparoscopic radical  prostatectomy and bilateral pelvic lymphadenectomy.  He tolerated this  procedure well, without complications.  Postoperatively, he was able to  be transferred to a regular hospital room following recovery from  anesthesia.  He was able to begin ambulating the night of surgery and  remained hemodynamically stable overnight.  He was evaluated on  postoperative day #1 and found to have excellent urine output with  minimal output from his pelvic drain.  His pelvic drain was, therefore,  removed.  He was advanced to a clear liquid diet and transitioned to  oral pain medication and was able to be discharged home in excellent  condition on the afternoon of postoperative day #1.   LABORATORY DATA:  Hematocrit postoperatively was 44.3 and on  postoperative day 1was 40.7.   DISPOSITION:  Home.   DISCHARGE MEDICATIONS:  The patient was instructed to resume his regular  home  medications except any aspirin, nonsteroidal anti-inflammatory  drugs, or herbal supplements.  He was given a prescription to take  Vicodin as needed for pain and to use Colace as a stool softener.  He  was also given a prescription to begin Cipro one day prior to his return  visit for Foley catheter removal.   DISCHARGE INSTRUCTIONS:  The patient was instructed to be ambulatory,  but specifically told to refrain from any heavy lifting, strenuous  activity, or driving.  He was instructed to gradually advance his diet  once passing flatus and instructed on routine Foley catheter care.   FOLLOWUP:  [name] will follow up in one week for removal of his  Foley catheter and to discuss his surgical pathology in detail.      [name], MD  Electronically Signed     LB/MEDQ  D:  04/08/2007  T:  04/10/2007  Job:  [id number]

## 2011-05-08 LAB — CBC
MCHC: 35.5
RDW: 12.5

## 2011-05-08 LAB — BASIC METABOLIC PANEL
CO2: 25
Calcium: 9.4
Creatinine, Ser: 0.77
Glucose, Bld: 133 — ABNORMAL HIGH

## 2011-05-08 LAB — TYPE AND SCREEN: ABO/RH(D): O POS

## 2011-05-08 LAB — HEMOGLOBIN AND HEMATOCRIT, BLOOD: Hemoglobin: 15.5

## 2011-05-08 LAB — ABO/RH: ABO/RH(D): O POS

## 2011-05-28 HISTORY — PX: OTHER SURGICAL HISTORY: SHX169

## 2011-06-08 ENCOUNTER — Ambulatory Visit (INDEPENDENT_AMBULATORY_CARE_PROVIDER_SITE_OTHER): Payer: BC Managed Care – PPO | Admitting: Family Medicine

## 2011-06-08 ENCOUNTER — Encounter: Payer: Self-pay | Admitting: Family Medicine

## 2011-06-08 VITALS — BP 148/80 | HR 88 | Temp 97.9°F | Wt 240.0 lb

## 2011-06-08 DIAGNOSIS — C61 Malignant neoplasm of prostate: Secondary | ICD-10-CM

## 2011-06-08 DIAGNOSIS — R109 Unspecified abdominal pain: Secondary | ICD-10-CM

## 2011-06-08 DIAGNOSIS — R1030 Lower abdominal pain, unspecified: Secondary | ICD-10-CM

## 2011-06-08 MED ORDER — CYCLOBENZAPRINE HCL 5 MG PO TABS: 5.0000 mg | ORAL_TABLET | Freq: Three times a day (TID) | ORAL | Status: DC | PRN

## 2011-06-08 NOTE — Patient Instructions (Signed)
I think this is a groin strain. Treat with flexeril as needed (may make you sleepy) and stretching exercises. PSA checked today to ensure not increasing. Update [place] if not improving as expected.

## 2011-06-08 NOTE — Assessment & Plan Note (Signed)
Given associated mild lumbar pain, and h/o prostatectomy, check PSA today.  Will forward to Dr. Laverle Patter. Anticipate groin strain, provided with stretching exercises from [facility] pt advisor.   Treat with flexeril for relaxant. Update [place] if not improving as expected.

## 2011-06-08 NOTE — Progress Notes (Signed)
  Subjective:    Patient ID: [name], male    DOB: June 11, 1946, 65 y.o.   MRN: [id number]  HPI CC: back and L leg pain  3wk h/o groin pain, now lower back is hurting as well as ribcage.  Has had cold, coughing bad.  Back pain started a few days after groin pain.  Relying on cane.  Denies inciting trauma, injury.  Progressively worsening.  Can't sleep in bed 2/2 back pain so sits in recliner.  Groin pain not present with sitting, standing, worse with steps.  Back pain stays present.  Used some bengay.  Recently active - played golf, built deck, cut trees in last few weekends.  More coughing recently as well.  Denies fevers/chills, radiculopathy, paresthesias, saddle anesthesia, bowel or bladder incontinence, LE weakness.  Seen by Dr. Venia Carbon pain specialist in [place], seen for bad knees, on pensad (topical).  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  Knees doing well.  Trial of celebrex last week, caused nausea.  Seen by [facility] as well.  Has CPE there.  Trying to lose weight.  UTD colonoscopy (2-22yrs ago).  S/p prostatectomy for prostate cancer, PSA has been climbing last checked 0.04.  Review of Systems Per HPI    Objective:   Physical Exam  Nursing note and vitals reviewed. Constitutional: He appears well-developed and well-nourished. No distress.  Musculoskeletal: He exhibits no edema.       Mild midline tenderness lumbar region. Minimal paraspinous mm tenderness. No SIJ or GTB pain. Neg SLR bilaterally. No pain with int/ext rotation at hip. No pain at sciatic notch. Pain with lifting knee when standing, pain with palpation of inner groin muscles  Neurological: He has normal strength. No sensory deficit. Coordination normal.  Reflex Scores:      Patellar reflexes are 2+ on the right side and 2+ on the left side.      Achilles reflexes are 2+ on the right side and 2+ on the left side.      Antalgic gait with cane  Skin: Skin is warm and dry. No rash noted.  Psychiatric: He has a normal mood and  affect.      Assessment & Plan:

## 2011-06-10 ENCOUNTER — Telehealth: Payer: Self-pay | Admitting: Family Medicine

## 2011-06-10 ENCOUNTER — Encounter (INDEPENDENT_AMBULATORY_CARE_PROVIDER_SITE_OTHER): Payer: BC Managed Care – PPO | Admitting: Family Medicine

## 2011-06-10 ENCOUNTER — Ambulatory Visit (INDEPENDENT_AMBULATORY_CARE_PROVIDER_SITE_OTHER)
Admission: RE | Admit: 2011-06-10 | Discharge: 2011-06-10 | Disposition: A | Discharge status: Home / Self Care | Payer: BC Managed Care – PPO | Source: Ambulatory Visit | Attending: Family Medicine | Admitting: Family Medicine

## 2011-06-10 DIAGNOSIS — R1030 Lower abdominal pain, unspecified: Secondary | ICD-10-CM

## 2011-06-10 DIAGNOSIS — S32000A Wedge compression fracture of unspecified lumbar vertebra, initial encounter for closed fracture: Secondary | ICD-10-CM | POA: Insufficient documentation

## 2011-06-10 DIAGNOSIS — R109 Unspecified abdominal pain: Secondary | ICD-10-CM

## 2011-06-10 NOTE — Telephone Encounter (Signed)
Patient notified. He said his pain is about the same. He will await to hear from [facility] in reference to DEXA. He prefers to go to [place]. Note forwarded to [facility] for appt.

## 2011-06-10 NOTE — Telephone Encounter (Signed)
Patient notified. Xray scheduled for today. Will fax OV, labs and xray results to Dr. Laverle Patter when xray is completed.

## 2011-06-10 NOTE — Telephone Encounter (Signed)
Please call for update on back and groin pain. Please notify PSA returned elevated at 0.19.  I would like him to return for back xray.  Placed order in chart. Notify I will fax results of blood work, xray and office visit to Dr. Laverle Patter as [facility] Childes.  Please fax to Dr. Laverle Patter [facility].

## 2011-06-10 NOTE — Telephone Encounter (Signed)
How is back and groin pain doing? Please notify xray returned showing mild lumbar compression fracture, new since 2010. Goal for this is pain control and preventing future fractures.  I would like to set him up for dexa scan to see if osteoporosis present and if would benefit from further medicine to help prevent this.  Order in chart. Forwarding note to urology. Will route to PCP as well to see if anything else she would like to do.

## 2011-06-11 NOTE — Telephone Encounter (Signed)
Agree wit Dr. Timoteo Expose plan of care.

## 2011-06-11 NOTE — Patient Instructions (Signed)
Patient was put on the schedule for an xray and arrived. X-rays should not be put on a schedule. Attempting to close this encounter.

## 2011-06-12 ENCOUNTER — Ambulatory Visit (INDEPENDENT_AMBULATORY_CARE_PROVIDER_SITE_OTHER)
Admission: RE | Admit: 2011-06-12 | Discharge: 2011-06-12 | Disposition: A | Discharge status: Home / Self Care | Payer: BC Managed Care – PPO | Source: Ambulatory Visit | Attending: Family Medicine | Admitting: Family Medicine

## 2011-06-12 ENCOUNTER — Other Ambulatory Visit: Payer: Self-pay | Admitting: Family Medicine

## 2011-06-12 ENCOUNTER — Telehealth: Payer: Self-pay | Admitting: *Deleted

## 2011-06-12 DIAGNOSIS — S32000A Wedge compression fracture of unspecified lumbar vertebra, initial encounter for closed fracture: Secondary | ICD-10-CM

## 2011-06-12 DIAGNOSIS — Z13820 Encounter for screening for osteoporosis: Secondary | ICD-10-CM

## 2011-06-12 DIAGNOSIS — S32009A Unspecified fracture of unspecified lumbar vertebra, initial encounter for closed fracture: Secondary | ICD-10-CM

## 2011-06-12 MED ORDER — POLYETHYLENE GLYCOL 3350 17 GM/SCOOP PO POWD: 17.0000 g | Freq: Every day | ORAL | Status: AC

## 2011-06-12 MED ORDER — HYDROCODONE-ACETAMINOPHEN 5-500 MG PO TABS: 1.0000 | ORAL_TABLET | Freq: Four times a day (QID) | ORAL | Status: DC | PRN

## 2011-06-12 NOTE — Telephone Encounter (Addendum)
nsaids cause gi upset (even celebrex). Where is pain more - groin or lower back?  Will do short course of vicodin please phone in (may make him itch like percocets).  please ensure we are allowed to prescribe narcotics and he is not under pain contract (has pain doctor). When was last stool?  Needs to push water and colace once or twice daily.  If no recent stool, let me know. May stop flexeril if didn't help groin strain. Awaiting DEXA results.

## 2011-06-12 NOTE — Telephone Encounter (Signed)
Spoke with patient. He said it's been 5-6 days with no BM. He said the back and groin pain were equal. He's not under a pain contract, but he has vicodin at home. I didn't call in anymore for that reason. I advised to increase water and take the miralax that you were sending in for him. I advised if he was still having problems on Monday to call me back. I also advised if things became painful to seek urgent care over the weekend. He verbalized understanding.

## 2011-06-12 NOTE — Telephone Encounter (Signed)
Patient called and said he has been constipated since before starting the flexeril. He wants to know what he should do about that. Advised OTC colace and increase water and fiber. He said he is in severe pain and needs something stronger. Advised that pain meds would cause more constipation but I would check with you. I explained that the flexeril is a muscle relaxer more than a pain med, so he probably could stop that for now unless you suggest that he continue it. He will await a return call.

## 2011-06-12 NOTE — Telephone Encounter (Signed)
Sent in.  Would want him to hold vicodin until has stool - as constipation can cause lower back pain.  Start miralax daily.

## 2011-06-12 NOTE — Telephone Encounter (Signed)
Patient notified

## 2011-06-15 ENCOUNTER — Telehealth: Payer: Self-pay | Admitting: *Deleted

## 2011-06-15 NOTE — Telephone Encounter (Signed)
Pt states he was told he has a cracked vertebrae but was not told what to do about it.  States he is in a lot of pain and needs the pain to go away so that he can go back to work.  Please advise on what he should do.  Uses [facility] [address].

## 2011-06-15 NOTE — Telephone Encounter (Signed)
Spoke with patient. He said he actually has had 2 BM's over the weekend. He only took the miralax on Saturday and hasn't had anymore since then, but hasn't had anymore BM's either. He says sitting still his pain is 1/10, when he stands it is 10/10. He wants to go back to work and needs to know what to do. He refuses to take the vicodin due to severe itching. He also said he has lost 7 pounds in 1 week according to his scale at home. I told him I would call him back with your suggestions.

## 2011-06-15 NOTE — Telephone Encounter (Signed)
Tried to call twice after 5pm, no answer. Left message.  will try again tomorrow.  Recommend daily miralax to prevent constipation as this can worsen back pain after compression fracture. If doesn't want to take vicodin, recommend scheduled tylenol ES 500mg  tid. Still awaiting dexa scan - can we check and see status of this? May also try nasal calcitonin (more expensive) for 3-4 wks for acute pain. If in pain like this - should not be going to work - will take time to heal.  I think groin pain and back pain are 2 separate issues.  Above treatment will help back pain, stretching should help groin pain.

## 2011-06-15 NOTE — Telephone Encounter (Signed)
Please call for update - how is constipation?  is he taking miralax regularly?   If has had a stool, has he tried vicodin for back pain and how much did it help? Still awaiting dexa scan results.

## 2011-06-16 NOTE — Telephone Encounter (Signed)
Spoke with pt. Recommend miralax daily as well as vicodin prn pain.  rec ES tylenol 500mg  tid. Pt will call back to schedule f/u appt tomorrow. Can we call and find out about dexa scan results?

## 2011-06-16 NOTE — Telephone Encounter (Signed)
I called [name]. They have not been signed off yet. I was advised to recheck for results in about 2 days.

## 2011-06-17 ENCOUNTER — Ambulatory Visit: Payer: BC Managed Care – PPO | Admitting: Family Medicine

## 2011-06-17 ENCOUNTER — Telehealth: Payer: Self-pay | Admitting: Family Medicine

## 2011-06-17 NOTE — Telephone Encounter (Signed)
Per patient he woke up and felt "decent" and voice is stronger, so he felt like he didn't need to be seen.

## 2011-06-17 NOTE — Telephone Encounter (Signed)
.  left message to have patient return my call.  

## 2011-06-17 NOTE — Telephone Encounter (Signed)
Patient called to cancel appt today and speech was slurred and hard to understand.  Please return patient's call.  According to scheduling, Dr. Sharen Hones had scheduled this appt yesterday and wanted to see patient today.  Call back needed as soon as possible.

## 2011-06-18 NOTE — Telephone Encounter (Signed)
Noted. Thanks.

## 2011-06-26 ENCOUNTER — Encounter: Payer: Self-pay | Admitting: Family Medicine

## 2011-06-26 ENCOUNTER — Ambulatory Visit (INDEPENDENT_AMBULATORY_CARE_PROVIDER_SITE_OTHER): Payer: BC Managed Care – PPO | Admitting: Family Medicine

## 2011-06-26 ENCOUNTER — Other Ambulatory Visit: Payer: Self-pay

## 2011-06-26 ENCOUNTER — Telehealth: Payer: Self-pay | Admitting: Radiology

## 2011-06-26 ENCOUNTER — Encounter (HOSPITAL_COMMUNITY): Payer: Self-pay | Admitting: Emergency Medicine

## 2011-06-26 ENCOUNTER — Inpatient Hospital Stay (HOSPITAL_COMMUNITY)
Admission: EM | Admit: 2011-06-26 | Discharge: 2011-06-29 | DRG: 566 | Disposition: A | Discharge status: Home / Self Care | Payer: BC Managed Care – PPO | Attending: Internal Medicine | Admitting: Internal Medicine

## 2011-06-26 DIAGNOSIS — E781 Pure hyperglyceridemia: Secondary | ICD-10-CM

## 2011-06-26 DIAGNOSIS — H8110 Benign paroxysmal vertigo, unspecified ear: Secondary | ICD-10-CM

## 2011-06-26 DIAGNOSIS — I10 Essential (primary) hypertension: Secondary | ICD-10-CM

## 2011-06-26 DIAGNOSIS — N039 Chronic nephritic syndrome with unspecified morphologic changes: Secondary | ICD-10-CM | POA: Diagnosis present

## 2011-06-26 DIAGNOSIS — C61 Malignant neoplasm of prostate: Secondary | ICD-10-CM | POA: Diagnosis present

## 2011-06-26 DIAGNOSIS — D631 Anemia in chronic kidney disease: Secondary | ICD-10-CM | POA: Diagnosis present

## 2011-06-26 DIAGNOSIS — E876 Hypokalemia: Secondary | ICD-10-CM | POA: Diagnosis present

## 2011-06-26 DIAGNOSIS — K219 Gastro-esophageal reflux disease without esophagitis: Secondary | ICD-10-CM | POA: Diagnosis present

## 2011-06-26 DIAGNOSIS — J309 Allergic rhinitis, unspecified: Secondary | ICD-10-CM

## 2011-06-26 DIAGNOSIS — R198 Other specified symptoms and signs involving the digestive system and abdomen: Secondary | ICD-10-CM

## 2011-06-26 DIAGNOSIS — D696 Thrombocytopenia, unspecified: Secondary | ICD-10-CM | POA: Diagnosis present

## 2011-06-26 DIAGNOSIS — E669 Obesity, unspecified: Secondary | ICD-10-CM

## 2011-06-26 DIAGNOSIS — S32000A Wedge compression fracture of unspecified lumbar vertebra, initial encounter for closed fracture: Secondary | ICD-10-CM

## 2011-06-26 DIAGNOSIS — D638 Anemia in other chronic diseases classified elsewhere: Secondary | ICD-10-CM

## 2011-06-26 DIAGNOSIS — C801 Malignant (primary) neoplasm, unspecified: Secondary | ICD-10-CM | POA: Diagnosis present

## 2011-06-26 DIAGNOSIS — D6959 Other secondary thrombocytopenia: Secondary | ICD-10-CM | POA: Diagnosis present

## 2011-06-26 DIAGNOSIS — M4850XA Collapsed vertebra, not elsewhere classified, site unspecified, initial encounter for fracture: Secondary | ICD-10-CM

## 2011-06-26 DIAGNOSIS — Z8546 Personal history of malignant neoplasm of prostate: Secondary | ICD-10-CM

## 2011-06-26 DIAGNOSIS — N179 Acute kidney failure, unspecified: Secondary | ICD-10-CM | POA: Diagnosis present

## 2011-06-26 DIAGNOSIS — S32009A Unspecified fracture of unspecified lumbar vertebra, initial encounter for closed fracture: Secondary | ICD-10-CM

## 2011-06-26 DIAGNOSIS — D649 Anemia, unspecified: Secondary | ICD-10-CM

## 2011-06-26 DIAGNOSIS — R634 Abnormal weight loss: Secondary | ICD-10-CM | POA: Diagnosis present

## 2011-06-26 DIAGNOSIS — R209 Unspecified disturbances of skin sensation: Secondary | ICD-10-CM

## 2011-06-26 DIAGNOSIS — R9431 Abnormal electrocardiogram [ECG] [EKG]: Secondary | ICD-10-CM

## 2011-06-26 DIAGNOSIS — M8440XA Pathological fracture, unspecified site, initial encounter for fracture: Secondary | ICD-10-CM | POA: Diagnosis present

## 2011-06-26 LAB — DIFFERENTIAL
Band Neutrophils: 0 % (ref 0–10)
Basophils Absolute: 0 K/uL (ref 0.0–0.1)
Basophils Relative: 0 % (ref 0–1)
Blasts: 0 %
Eosinophils Absolute: 0.4 K/uL (ref 0.0–0.7)
Eosinophils Relative: 2 % (ref 0–5)
Lymphocytes Relative: 42 % (ref 12–46)
Lymphs Abs: 8.1 10*3/uL — ABNORMAL HIGH (ref 0.7–4.0)
Metamyelocytes Relative: 0 %
Monocytes Absolute: 2.7 10*3/uL — ABNORMAL HIGH (ref 0.1–1.0)
Monocytes Relative: 14 % — ABNORMAL HIGH (ref 3–12)
Myelocytes: 0 %
Neutro Abs: 8.2 10*3/uL — ABNORMAL HIGH (ref 1.7–7.7)
Neutrophils Relative %: 42 % — ABNORMAL LOW (ref 43–77)
Promyelocytes Absolute: 0 %
nRBC: 4 /100 WBC — ABNORMAL HIGH

## 2011-06-26 LAB — POCT I-STAT, CHEM 8
BUN: 60 mg/dL — ABNORMAL HIGH (ref 6–23)
Calcium, Ion: 1.74 mmol/L (ref 1.12–1.32)
Chloride: 103 mEq/L (ref 96–112)
Creatinine, Ser: 5.1 mg/dL — ABNORMAL HIGH (ref 0.50–1.35)
Glucose, Bld: 102 mg/dL — ABNORMAL HIGH (ref 70–99)
HCT: 27 % — ABNORMAL LOW (ref 39.0–52.0)
Hemoglobin: 9.2 g/dL — ABNORMAL LOW (ref 13.0–17.0)
Potassium: 3.3 mEq/L — ABNORMAL LOW (ref 3.5–5.1)
Sodium: 137 meq/L (ref 135–145)
TCO2: 26 mmol/L (ref 0–100)

## 2011-06-26 LAB — CBC
HCT: 27 % — ABNORMAL LOW (ref 39.0–52.0)
Hemoglobin: 9.7 g/dL — ABNORMAL LOW (ref 13.0–17.0)
MCH: 31.9 pg (ref 26.0–34.0)
MCHC: 35.9 g/dL (ref 30.0–36.0)
MCV: 88.8 fL (ref 78.0–100.0)
Platelets: 22 10*3/uL — CL (ref 150–400)
RBC: 3.04 MIL/uL — ABNORMAL LOW (ref 4.22–5.81)
RDW: 12.6 % (ref 11.5–15.5)
WBC: 19.4 10*3/uL — ABNORMAL HIGH (ref 4.0–10.5)

## 2011-06-26 LAB — BASIC METABOLIC PANEL WITH GFR
BUN: 60 mg/dL — ABNORMAL HIGH (ref 6–23)
Calcium: 14.5 mg/dL (ref 8.4–10.5)
Chloride: 98 meq/L (ref 96–112)
Creatinine, Ser: 4.47 mg/dL — ABNORMAL HIGH (ref 0.50–1.35)
GFR calc Af Amer: 15 mL/min — ABNORMAL LOW (ref >90)

## 2011-06-26 LAB — URINALYSIS, ROUTINE W REFLEX MICROSCOPIC
Bilirubin Urine: NEGATIVE
Glucose, UA: NEGATIVE mg/dL
Ketones, ur: NEGATIVE mg/dL
Nitrite: NEGATIVE
Protein, ur: 100 mg/dL — AB
Specific Gravity, Urine: 1.02 (ref 1.005–1.030)
Urobilinogen, UA: 0.2 mg/dL (ref 0.0–1.0)
pH: 5.5 (ref 5.0–8.0)

## 2011-06-26 LAB — CK: Total CK: 81 U/L (ref 7–232)

## 2011-06-26 LAB — URINE MICROSCOPIC-ADD ON

## 2011-06-26 LAB — BASIC METABOLIC PANEL
CO2: 24 mEq/L (ref 19–32)
GFR calc non Af Amer: 13 mL/min — ABNORMAL LOW (ref >90)
Glucose, Bld: 104 mg/dL — ABNORMAL HIGH (ref 70–99)
Potassium: 3.2 mEq/L — ABNORMAL LOW (ref 3.5–5.1)
Sodium: 136 mEq/L (ref 135–145)

## 2011-06-26 LAB — BUN: BUN: 61 mg/dL — ABNORMAL HIGH (ref 6–23)

## 2011-06-26 MED ORDER — SIMVASTATIN 20 MG PO TABS: 20.0000 mg | ORAL_TABLET | Freq: Every day | ORAL | Status: DC

## 2011-06-26 MED ORDER — ASPIRIN 81 MG PO CHEW
81.0000 mg | CHEWABLE_TABLET | Freq: Every day | ORAL | Status: DC
  Administered 2011-06-27 – 2011-06-29 (×3): 81 mg via ORAL
  Filled 2011-06-26 (×3): qty 1

## 2011-06-26 MED ORDER — ONE-DAILY MULTI VITAMINS PO TABS: 1.0000 | ORAL_TABLET | Freq: Every day | ORAL | Status: DC

## 2011-06-26 MED ORDER — FENTANYL CITRATE 0.05 MG/ML IJ SOLN
50.0000 ug | Freq: Once | INTRAMUSCULAR | Status: AC
  Administered 2011-06-26: 50 ug via INTRAVENOUS
  Filled 2011-06-26: qty 2

## 2011-06-26 MED ORDER — ONDANSETRON HCL 4 MG/2ML IJ SOLN: 4.0000 mg | Freq: Four times a day (QID) | INTRAMUSCULAR | Status: DC | PRN

## 2011-06-26 MED ORDER — SODIUM CHLORIDE 0.9 % IV BOLUS (SEPSIS)
500.0000 mL | Freq: Once | INTRAVENOUS | Status: AC
  Administered 2011-06-26: 1000 mL via INTRAVENOUS

## 2011-06-26 MED ORDER — SODIUM CHLORIDE 0.9 % IV SOLN
90.0000 mg | Freq: Once | INTRAVENOUS | Status: AC
  Administered 2011-06-26: 90 mg via INTRAVENOUS
  Filled 2011-06-26: qty 500

## 2011-06-26 MED ORDER — ENOXAPARIN SODIUM 30 MG/0.3ML ~~LOC~~ SOLN: 30.0000 mg | SUBCUTANEOUS | Status: DC

## 2011-06-26 MED ORDER — ENOXAPARIN SODIUM 40 MG/0.4ML ~~LOC~~ SOLN: 40.0000 mg | SUBCUTANEOUS | Status: DC

## 2011-06-26 MED ORDER — SODIUM CHLORIDE 0.9 % IV SOLN
INTRAVENOUS | Status: DC
  Administered 2011-06-27: 09:00:00 via INTRAVENOUS

## 2011-06-26 MED ORDER — PANTOPRAZOLE SODIUM 40 MG IV SOLR
40.0000 mg | Freq: Two times a day (BID) | INTRAVENOUS | Status: DC
  Administered 2011-06-27 – 2011-06-28 (×3): 40 mg via INTRAVENOUS
  Filled 2011-06-26 (×4): qty 40

## 2011-06-26 MED ORDER — ACETAMINOPHEN 325 MG PO TABS: 650.0000 mg | ORAL_TABLET | Freq: Four times a day (QID) | ORAL | Status: DC | PRN

## 2011-06-26 MED ORDER — HYDROMORPHONE HCL PF 1 MG/ML IJ SOLN
0.5000 mg | INTRAMUSCULAR | Status: DC | PRN
  Administered 2011-06-28: 1 mg via INTRAVENOUS
  Filled 2011-06-26: qty 1

## 2011-06-26 MED ORDER — ACETAMINOPHEN 650 MG RE SUPP: 650.0000 mg | Freq: Four times a day (QID) | RECTAL | Status: DC | PRN

## 2011-06-26 MED ORDER — ONDANSETRON HCL 4 MG PO TABS: 4.0000 mg | ORAL_TABLET | Freq: Four times a day (QID) | ORAL | Status: DC | PRN

## 2011-06-26 MED ORDER — SODIUM CHLORIDE 0.9 % IV SOLN: Freq: Once | INTRAVENOUS | Status: DC

## 2011-06-26 NOTE — ED Notes (Signed)
Pt had imaging today and found out had 3 fractured vertebrae in back and then had preliminary blood work and had elevated creatinine and was told to come here for eval and rehydration; pt c/o lower back pain but denies obvious injury

## 2011-06-26 NOTE — H&P (Signed)
DATE OF ADMISSION:  06/26/2011  PCP:    [name], MD, MD   Chief Complaint: Abnormal labs  HPI: [name] is an 65 y.o. male with a history of prostate cancer diagnosed in 2008 and is S/P Prostatectomy who was sent to the Emergency Dpartment due to Abnormal labs which included an elevated BUN and Creatinine but also an elevated Calcium level.  Patient has had severe back pain for the past month and was found to have a compression fractures and was to have a kyphoplasty performed by Interventional Radiology.  As part of that workup he had labs performed and was to have an MRI, the labs returned as mentioned above.  Repeat of the laboratory studies in the Emergency Department revealed a Calcium level of 14.5, and a BUN = 60 and a Creatinine of 5.10.    Over the past month he reports having severe indigestion, and poor appetite and unintentional weight loss of 25 pounds. He has also had the back pain.  His significant other reports that he has also had increased confusion as well.   He denies having  fevers, or chills, or night sweats or cough or chest pain.    Past Medical History  Diagnosis Date  . GERD (gastroesophageal reflux disease)   . HTN (hypertension)   . Allergic rhinitis   . Prostate cancer 2008    s/p prostatectomy    Past Surgical History  Procedure Date  . Prostatectomy 04/06/07  . Kidney stone surgery   . Kidney stone surgery 06/2009    Stone removed from bladder    Medications:  HOME MEDS: Prior to Admission medications   Medication Sig Start Date End Date Taking? Authorizing Provider  aspirin 81 MG chewable tablet Chew 81 mg by mouth daily.     Yes Historical Provider, MD  Multiple Vitamin (MULTIVITAMIN) tablet Take 1 tablet by mouth daily.     Yes Historical Provider, MD  simvastatin (ZOCOR) 40 MG tablet Take 20 mg by mouth at bedtime.     Yes Historical Provider, MD    Allergies:  Allergies  Allergen Reactions  . Celebrex (Celecoxib) Nausea Only    . Meloxicam Itching  . Percocet (Oxycodone-Acetaminophen) Itching  . Vicodin (Hydrocodone-Acetaminophen)     Social History:   reports that he quit smoking about 14 years ago. He does not have any smokeless tobacco history on file. He reports that he drinks alcohol. He reports that he does not use illicit drugs.  Family History: Family History  Problem Relation Age of Onset  . Pneumonia Mother   . Stroke Father   . Cancer Brother     lung  . Cancer Sister     bladder    Review of Systems:  The patient denies anorexia, fever, weight loss, vision loss, decreased hearing, hoarseness, chest pain, syncope, dyspnea on exertion, peripheral edema, balance deficits, hemoptysis, abdominal pain, melena, hematochezia, hematuria, incontinence, genital sores, muscle weakness, suspicious skin lesions, transient blindness, difficulty walking, depression, unusual weight change, abnormal bleeding, enlarged lymph nodes, angioedema.   Physical Exam:  GEN:  Pleasant 65 year old male and in discomfort but no acute distress; cooperative with exam Filed Vitals:   06/26/11 1617 06/26/11 1823 06/26/11 2052 06/26/11 2120  BP: 166/78 154/84 173/72 171/74  Pulse: 108 92 100 97  Temp: 97.9 F (36.6 C)     TempSrc: Oral     Resp: 20  24 15   SpO2: 96% 96%  96%   Blood pressure 171/74,  pulse 97, temperature 97.9 F (36.6 C), temperature source Oral, resp. rate 15, SpO2 96.00%. PSYCH: He is alert and oriented x4; does not appear anxious does not appear depressed; affect is normal HEENT: Normocephalic and Atraumatic, Mucous membranes pink; PERRLA; EOM intact; Fundi:  Benign;  No scleral icterus, Nares: Patent, Oropharynx: Clear, Denture Upper palate, Partial on Lower palate, Neck:  FROM, no cervical lymphadenopathy nor thyromegaly or carotid bruit; no JVD; Breasts:: Not examined CHEST WALL: No tenderness CHEST: Normal respiration, clear to auscultation bilaterally HEART: Regular rate and rhythm; no murmurs  rubs or gallops BACK: No kyphosis or scoliosis; no CVA tenderness ABDOMEN: Positive Bowel Sounds, soft non-tender; no masses, no organomegaly. Rectal Exam: Not done EXTREMITIES: No bone or joint deformity; age-appropriate arthropathy of the hands and knees; no cyanosis, clubbing or edema; no ulcerations. Genitalia: not examined PULSES: 2+ and symmetric SKIN: Normal hydration no rash or ulceration CNS: Cranial nerves 2-12 grossly intact no focal neurologic deficit   Labs & Imaging Results for orders placed during the hospital encounter of 06/26/11 (from the past 48 hour(s))  BASIC METABOLIC PANEL     Status: Abnormal   Collection Time   06/26/11  6:58 PM      Component Value Range Comment   Sodium 136  135 - 145 (mEq/L)    Potassium 3.2 (*) 3.5 - 5.1 (mEq/L)    Chloride 98  96 - 112 (mEq/L)    CO2 24  19 - 32 (mEq/L)    Glucose, Bld 104 (*) 70 - 99 (mg/dL)    BUN 60 (*) 6 - 23 (mg/dL)    Creatinine, Ser 4.09 (*) 0.50 - 1.35 (mg/dL)    Calcium 81.1 (*) 8.4 - 10.5 (mg/dL)    GFR calc non Af Amer 13 (*) >90 (mL/min)    GFR calc Af Amer 15 (*) >90 (mL/min)   CK     Status: Normal   Collection Time   06/26/11  6:58 PM      Component Value Range Comment   Total CK 81  7 - 232 (U/L)   CBC     Status: Abnormal   Collection Time   06/26/11  7:41 PM      Component Value Range Comment   WBC 19.4 (*) 4.0 - 10.5 (K/uL) WHITE COUNT CONFIRMED ON SMEAR   RBC 3.04 (*) 4.22 - 5.81 (MIL/uL)    Hemoglobin 9.7 (*) 13.0 - 17.0 (g/dL)    HCT 91.4 (*) 78.2 - 52.0 (%)    MCV 88.8  78.0 - 100.0 (fL)    MCH 31.9  26.0 - 34.0 (pg)    MCHC 35.9  30.0 - 36.0 (g/dL)    RDW 95.6  21.3 - 08.6 (%)    Platelets 22 (*) 150 - 400 (K/uL)   DIFFERENTIAL     Status: Abnormal   Collection Time   06/26/11  7:41 PM      Component Value Range Comment   Neutrophils Relative 42 (*) 43 - 77 (%)    Lymphocytes Relative 42  12 - 46 (%)    Monocytes Relative 14 (*) 3 - 12 (%)    Eosinophils Relative 2  0 - 5 (%)     Basophils Relative 0  0 - 1 (%)    Band Neutrophils 0  0 - 10 (%)    Metamyelocytes Relative 0      Myelocytes 0      Promyelocytes Absolute 0      Blasts 0  nRBC 4 (*) 0 (/100 WBC)    Neutro Abs 8.2 (*) 1.7 - 7.7 (K/uL)    Lymphs Abs 8.1 (*) 0.7 - 4.0 (K/uL)    Monocytes Absolute 2.7 (*) 0.1 - 1.0 (K/uL)    Eosinophils Absolute 0.4  0.0 - 0.7 (K/uL)    Basophils Absolute 0.0  0.0 - 0.1 (K/uL)    RBC Morphology RARE NRBCs      WBC Morphology MILD LEFT SHIFT (1-5% METAS, OCC MYELO, OCC BANDS)   ATYPICAL LYMPHOCYTES  URINALYSIS, ROUTINE W REFLEX MICROSCOPIC     Status: Abnormal   Collection Time   06/26/11  7:59 PM      Component Value Range Comment   Color, Urine YELLOW  YELLOW     APPearance CLEAR  CLEAR     Specific Gravity, Urine 1.020  1.005 - 1.030     pH 5.5  5.0 - 8.0     Glucose, UA NEGATIVE  NEGATIVE (mg/dL)    Hgb urine dipstick MODERATE (*) NEGATIVE     Bilirubin Urine NEGATIVE  NEGATIVE     Ketones, ur NEGATIVE  NEGATIVE (mg/dL)    Protein, ur 161 (*) NEGATIVE (mg/dL)    Urobilinogen, UA 0.2  0.0 - 1.0 (mg/dL)    Nitrite NEGATIVE  NEGATIVE     Leukocytes, UA TRACE (*) NEGATIVE    URINE MICROSCOPIC-ADD ON     Status: Abnormal   Collection Time   06/26/11  7:59 PM      Component Value Range Comment   Squamous Epithelial / LPF RARE  RARE     WBC, UA 3-6  <3 (WBC/hpf)    RBC / HPF 0-2  <3 (RBC/hpf)    Bacteria, UA RARE  RARE     Casts GRANULAR CAST (*) NEGATIVE    POCT I-STAT, CHEM 8     Status: Abnormal   Collection Time   06/26/11  8:00 PM      Component Value Range Comment   Sodium 137  135 - 145 (mEq/L)    Potassium 3.3 (*) 3.5 - 5.1 (mEq/L)    Chloride 103  96 - 112 (mEq/L)    BUN 60 (*) 6 - 23 (mg/dL)    Creatinine, Ser 0.96 (*) 0.50 - 1.35 (mg/dL)    Glucose, Bld 045 (*) 70 - 99 (mg/dL)    Calcium, Ion 4.09 (*) 1.12 - 1.32 (mmol/L)    TCO2 26  0 - 100 (mmol/L)    Hemoglobin 9.2 (*) 13.0 - 17.0 (g/dL)    HCT 81.1 (*) 91.4 - 52.0 (%)    Comment  NOTIFIED PHYSICIAN      No results found.    Assessment/Plan: 1.  Hypercalcemia- Most likely due to Malignancy, has hx of Prostate Cancer.  IV Pamidronate has been ordered along with IVFs for Rehydration and Fluid Rescusitation.  His medications. 2.  ARF-IVFs. 3.  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 He is followed by Urologist  Dr. Laverle Patter.  Pain control therapy has been ordered.    4.  Medications reconciled. 5. DVT Prophylaxis ordered. 6.  Other plans as per orders.    CODE STATUS:      FULL CODE        Elai Vanwyk C 06/26/2011, 9:46 PM

## 2011-06-26 NOTE — Progress Notes (Signed)
  Subjective:    Patient ID: [name], male    DOB: [birth year], 65 y.o.   MRN: [id number]  HPI  Seen 11/16 for groin pain and new onset low back pain by Dr. Bonne Dolores returned showing mild lumbar compression fracture in L3, new since 2010.   Pain control and preventing future fractures was plan.Set him up for dexa scan to see if osteoporosis present and if would benefit from further medicine to help prevent this.  Pain very poorly controlled.  Percocet/Vicodin causes constipation...so stopped.  Muscle relaxant not helping much.  Pain started after severe coughing spells. Focal pain in vertrebrae around upper lumbar spine. No radiation of pain. No leg weakness, no  Celebrex cause SE. Has lost 25 lbs. Decreased appetite, nausea, not sleeping due to poor pain control.  DXA is pending.. Will try to obtain.  Has appt with URO (Dr. Laverle Patter) in 06/2011.Marland Kitchen PSA is starting to rise slightly. PSA at 0.19 last check. Has radiation oncologist.  Review of Systems  All other systems reviewed and are negative.       Objective:   Physical Exam  Constitutional: Vital signs are normal. He appears well-developed and well-nourished.  HENT:  Head: Normocephalic.  Right Ear: Hearing normal.  Left Ear: Hearing normal.  Nose: Nose normal.  Mouth/Throat: Oropharynx is clear and moist and mucous membranes are normal.  Neck: Trachea normal. Carotid bruit is not present. No mass and no thyromegaly present.  Cardiovascular: Normal rate, regular rhythm and normal pulses.  Exam reveals no gallop, no distant heart sounds and no friction rub.   No murmur heard.      No peripheral edema  Pulmonary/Chest: Effort normal and breath sounds normal. No respiratory distress.  Musculoskeletal:       Lumbar back: He exhibits decreased range of motion and bony tenderness.       Tenderness in lumbar spine No radiation  neg SLR, neg Faber's  Skin: Skin is warm, dry and intact. No rash noted.  Psychiatric: He has a  normal mood and affect. His speech is normal and behavior is normal. Thought content normal.          Assessment & Plan:

## 2011-06-26 NOTE — ED Notes (Signed)
Has stress fractures in back & is scheduled for MRI soon. Informed by PCP has elevated creatinine level. Sent here to receive IV fluids & hydration. No voiced complaints other than back pain.

## 2011-06-26 NOTE — Patient Instructions (Addendum)
Start ca and Vitr D supplement.  We will call with bone density results. Stop at front desk to schedule MRI of spine to rule out pathologic cause of fracture in spine... Follow with appt with interventional radiology.  Can try trial of vicodin/percocet for pain along with using mirilax OTC daily to prevent constipation.

## 2011-06-26 NOTE — ED Provider Notes (Addendum)
History     CSN: [id number] Arrival date & time: 06/26/2011  4:12 PM   First MD Initiated Contact with Patient 06/26/11 1838      Chief Complaint  Patient presents with  . Back Pain  . Dehydration    (Consider location/radiation/quality/duration/timing/severity/associated sxs/prior treatment) HPI Comments: Patient reports that he's had some intermittent back problems and has been seeing his regular physician Dr. Ermalene Searing with the [facility]. A couple of weeks ago he was found to have compression fractures of his lumbar spine by x-ray. He was referred to interventional radiology to obtain vertebral plasty procedure. As a routine he had blood test today in preparation for an MRI with contrast. He also has a significant history of prostate cancer in the past as well. Due to the low back pain he has been hesitant to drink a lot of water and has had difficulty walking around at home therefore his appetite has been down. He reports he had lost about 30 pounds on purpose for general health but in the last month he's lost probably an additional 20 pounds simply from not eating and drinking. He was called by the radiology staff that his blood tests were abnormal in that he was likely very dehydrated and instructed to come to the hospital for further testing. He denies any new numbness or weakness. He denies suffering from any injury or fall so as not sure where the compression fractures came from. He denies any fever or chills. No vomiting or diarrhea. Pain in his back is primarily across his low back it does not radiate. He has had some "indigestion". But he denies any specific chest pain, shortness of breath, nausea, sweating. He reports that his urine is dark in color, almost a greenish color but has not had hesitancy or dysuria.  Patient is a 65 y.o. male presenting with back pain. The history is provided by the patient and the spouse.  Back Pain  Pertinent negatives include no chest pain, no fever, no  numbness, no headaches, no abdominal pain, no dysuria and no weakness.    Past Medical History  Diagnosis Date  . GERD (gastroesophageal reflux disease)   . HTN (hypertension)   . Allergic rhinitis   . Prostate cancer 2008    s/p prostatectomy    Past Surgical History  Procedure Date  . Prostatectomy 04/06/07  . Kidney stone surgery   . Kidney stone surgery 06/2009    Stone removed from bladder    Family History  Problem Relation Age of Onset  . Pneumonia Mother   . Stroke Father   . Cancer Brother     lung  . Cancer Sister     bladder    History  Substance Use Topics  . Smoking status: Former Smoker    Quit date: 07/27/1996  . Smokeless tobacco: Not on file  . Alcohol Use: Yes     weekly      Review of Systems  Constitutional: Negative.  Negative for fever and chills.  HENT: Negative for congestion, rhinorrhea and postnasal drip.   Respiratory: Negative for chest tightness and shortness of breath.   Cardiovascular: Negative for chest pain and leg swelling.  Gastrointestinal: Negative for nausea, vomiting, abdominal pain, diarrhea and constipation.  Genitourinary: Positive for decreased urine volume. Negative for dysuria and urgency.  Musculoskeletal: Positive for back pain. Negative for joint swelling.  Skin: Negative for color change, rash and wound.  Neurological: Negative for weakness, numbness and headaches.  All other systems reviewed  and are negative.    Allergies  Celebrex; Meloxicam; Percocet; and Vicodin  Home Medications   Current Outpatient Rx  Name Route Sig Dispense Refill  . ASPIRIN 81 MG PO CHEW Oral Chew 81 mg by mouth daily.      . CELEBREX 200 MG PO CAPS Oral Take 1 capsule by mouth 2 (two) times daily.    . CYCLOBENZAPRINE HCL 5 MG PO TABS Oral Take 1 tablet by mouth Three times daily as needed. For muscle spasms.     Marland Kitchen HYDROCHLOROTHIAZIDE 12.5 MG PO CAPS Oral Take 12.5 mg by mouth daily.      Marland Kitchen HYDROCODONE-ACETAMINOPHEN 5-500 MG PO  TABS Oral Take 1 tablet by mouth every 6 (six) hours as needed for pain. 20 tablet 0  . ONE-DAILY MULTI VITAMINS PO TABS Oral Take 1 tablet by mouth daily.      Marland Kitchen SIMVASTATIN 40 MG PO TABS Oral Take 20 mg by mouth at bedtime.        BP 154/84  Pulse 92  Temp(Src) 97.9 F (36.6 C) (Oral)  Resp 20  SpO2 96%  Physical Exam  Nursing note and vitals reviewed. Constitutional: He appears well-developed and well-nourished. No distress.  HENT:  Head: Normocephalic and atraumatic.  Mouth/Throat: Uvula is midline. Mucous membranes are dry and not cyanotic.  Eyes: Pupils are equal, round, and reactive to light.  Neck: Neck supple.  Cardiovascular: Normal rate.   Abdominal: Soft.  Musculoskeletal:       Back:  Skin: No rash noted.    ED Course  Procedures (including critical care time)   Labs Reviewed  BASIC METABOLIC PANEL  URINALYSIS, ROUTINE W REFLEX MICROSCOPIC  CK  CBC  I-STAT, CHEM 8   No results found.   No diagnosis found.   Room air saturation is 96% which is interpreted as normal.    CRITICAL CARE Performed by: Lear Ng.   Total critical care time: 30 min  Critical care time was exclusive of separately billable procedures and treating other patients.  Critical care was necessary to treat or prevent imminent or life-threatening deterioration.  Critical care was time spent personally by me on the following activities: development of treatment plan with patient and/or surrogate as well as nursing, discussions with consultants, evaluation of patient's response to treatment, examination of patient, obtaining history from patient or surrogate, ordering and performing treatments and interventions, ordering and review of laboratory studies, ordering and review of radiographic studies, pulse oximetry and re-evaluation of patient's condition.   MDM    Will recheck basic metabolic panel to recheck creatinine and also look at his calcium level. We'll check  Urinalysis and Pl., Foley catheter. We'll also check a CK total. Will begin normal saline IV fluid rehydration. If indeed his BUN and creatinine are elevated significantly, he will need medical admission for IV fluid rehydration and monitoring for acute renal failure. Plan is to also give him some IV fentanyl for his low back pain. He has normal distal lower chimney strength and normal reflexes. I do not suspect any immediate acute spinal cord abnormality. I did review the patient's prior radiographs as well as primary care physician notes.       8:16 PM Pt's Cr is over 4, calcium is at 14, critically high, will need IVF's, and admission to [facility].  I am concerned for possibly bony destruction from recurrence of prostate cancer.  His family did note that PSA has been up recently per PCP. Also his weight loss  seems excessive compared to simply not eating and drinking.       Gavin Pound. Oletta Lamas, MD 06/26/11 2018  8:32 PM Dr. Lovell Sheehan will see and admit, likely will transfer to [facility].  Gavin Pound. Chairty Toman, MD 06/26/11 2032   ECG at 20:49, rate 99, NSR, LVH with QRS widening.  Non specific ST and T wave changes.  Prolonged QT.  No prior ECG's are immediately available.  Pt will be on cardiac monitor, IVF's being given.    Gavin Pound. Oletta Lamas, MD 06/26/11 2055

## 2011-06-26 NOTE — Telephone Encounter (Signed)
[name] Lab called a critical CRT - 5.04

## 2011-06-26 NOTE — Telephone Encounter (Signed)
Already noted

## 2011-06-27 DIAGNOSIS — E876 Hypokalemia: Secondary | ICD-10-CM | POA: Diagnosis present

## 2011-06-27 DIAGNOSIS — D696 Thrombocytopenia, unspecified: Secondary | ICD-10-CM

## 2011-06-27 DIAGNOSIS — D6959 Other secondary thrombocytopenia: Secondary | ICD-10-CM | POA: Diagnosis present

## 2011-06-27 DIAGNOSIS — D638 Anemia in other chronic diseases classified elsewhere: Secondary | ICD-10-CM | POA: Diagnosis present

## 2011-06-27 DIAGNOSIS — C61 Malignant neoplasm of prostate: Secondary | ICD-10-CM

## 2011-06-27 DIAGNOSIS — N179 Acute kidney failure, unspecified: Secondary | ICD-10-CM | POA: Diagnosis present

## 2011-06-27 DIAGNOSIS — D649 Anemia, unspecified: Secondary | ICD-10-CM

## 2011-06-27 LAB — LACTATE DEHYDROGENASE: LDH: 416 U/L — ABNORMAL HIGH (ref 94–250)

## 2011-06-27 LAB — DIC (DISSEMINATED INTRAVASCULAR COAGULATION)PANEL
Fibrinogen: 414 mg/dL (ref 204–475)
Platelets: 23 10*3/uL — CL (ref 150–400)
Smear Review: NONE SEEN
aPTT: 52 seconds — ABNORMAL HIGH (ref 24–37)

## 2011-06-27 LAB — CBC
HCT: 26.5 % — ABNORMAL LOW (ref 39.0–52.0)
MCH: 31.2 pg (ref 26.0–34.0)
MCV: 89.8 fL (ref 78.0–100.0)
RBC: 2.95 MIL/uL — ABNORMAL LOW (ref 4.22–5.81)
WBC: 17.7 10*3/uL — ABNORMAL HIGH (ref 4.0–10.5)

## 2011-06-27 LAB — RETICULOCYTES
Retic Count, Absolute: 23.2 10*3/uL (ref 19.0–186.0)
Retic Ct Pct: 0.8 % (ref 0.4–3.1)

## 2011-06-27 MED ORDER — ENOXAPARIN SODIUM 30 MG/0.3ML ~~LOC~~ SOLN
30.0000 mg | SUBCUTANEOUS | Status: DC
  Administered 2011-06-27 – 2011-06-29 (×3): 30 mg via SUBCUTANEOUS
  Filled 2011-06-27 (×3): qty 0.3

## 2011-06-27 MED ORDER — THERA M PLUS PO TABS
1.0000 | ORAL_TABLET | Freq: Every day | ORAL | Status: DC
  Administered 2011-06-27: 1 via ORAL
  Filled 2011-06-27: qty 1

## 2011-06-27 MED ORDER — POTASSIUM CHLORIDE CRYS ER 20 MEQ PO TBCR
40.0000 meq | EXTENDED_RELEASE_TABLET | Freq: Once | ORAL | Status: AC
  Administered 2011-06-28: 40 meq via ORAL
  Filled 2011-06-27: qty 2

## 2011-06-27 MED ORDER — THERA M PLUS PO TABS
1.0000 | ORAL_TABLET | Freq: Every day | ORAL | Status: DC
  Administered 2011-06-28: 1 via ORAL
  Filled 2011-06-27 (×2): qty 1

## 2011-06-27 MED ORDER — SIMVASTATIN 20 MG PO TABS
20.0000 mg | ORAL_TABLET | Freq: Every day | ORAL | Status: DC
  Administered 2011-06-27 – 2011-06-28 (×2): 20 mg via ORAL
  Filled 2011-06-27 (×3): qty 1

## 2011-06-27 NOTE — Progress Notes (Signed)
Patient ID: [name], male   DOB: 05/06/1946, 65 y.o.   MRN: [id number]  Subjective: No events overnight. Patient denies chest pain, shortness of breath, abdominal pain at this time.  Objective:  Vital signs in last 24 hours:  Filed Vitals:   06/27/11 1406 06/27/11 1653 06/27/11 1824 06/27/11 2000  BP: 140/69 159/70 154/72 143/73  Pulse:    90  Temp:   97.8 F (36.6 C) 98 F (36.7 C)  TempSrc:   Oral Oral  Resp: 20 20  21   Height:   6\' 3"  (1.905 m)   Weight:   99.8 kg (220 lb 0.3 oz)   SpO2: 94% 98% 95% 92%    Intake/Output from previous day:   Intake/Output Summary (Last 24 hours) at 06/27/11 2318 Last data filed at 06/27/11 2200  Gross per 24 hour  Intake    625 ml  Output    950 ml  Net   -325 ml    Physical Exam: GENERAL: He is alert and oriented x4; does not appear anxious does not appear depressed; affect is normal  HEENT: Normocephalic, atraumatic, mucous membranes pink; PERRLA; EOM intact, No scleral icterus,  CHEST: Normal respiration, clear to auscultation bilaterally  HEART: Regular rate and rhythm; no murmurs rubs or gallops  BACK: No kyphosis or scoliosis; no CVA tenderness  ABDOMEN: Positive Bowel Sounds, soft non-tender; no masses, no organomegaly.  Rectal Exam: Not done  EXTREMITIES: No bone or joint deformity; age-appropriate arthropathy of the hands and knees; no cyanosis, or edema; no ulcerations.  CNS: Cranial nerves 2-12 grossly intact no focal neurologic deficit  Lab Results:  Basic Metabolic Panel:    Component Value Date/Time   NA 137 06/26/2011 2000   K 3.3* 06/26/2011 2000   CL 103 06/26/2011 2000   CO2 24 06/26/2011 1858   BUN 60* 06/26/2011 2000   CREATININE 5.10* 06/26/2011 2000   GLUCOSE 102* 06/26/2011 2000   CALCIUM 14.5* 06/26/2011 1858   CBC:    Component Value Date/Time   WBC 17.7* 06/27/2011 0500   HGB 9.2* 06/27/2011 0500   HCT 26.5* 06/27/2011 0500   PLT 23* 06/27/2011 1627   MCV 89.8 06/27/2011 0500   NEUTROABS  8.2* 06/26/2011 1941   LYMPHSABS 8.1* 06/26/2011 1941   MONOABS 2.7* 06/26/2011 1941   EOSABS 0.4 06/26/2011 1941   BASOSABS 0.0 06/26/2011 1941      Lab 06/27/11 1627 06/27/11 0500 06/26/11 2000 06/26/11 1941  WBC -- 17.7* -- 19.4*  HGB -- 9.2* 9.2* 9.7*  HCT -- 26.5* 27.0* 27.0*  PLT 23* 21* -- 22*  MCV -- 89.8 -- 88.8  MCH -- 31.2 -- 31.9  MCHC -- 34.7 -- 35.9  RDW -- 12.7 -- 12.6  LYMPHSABS -- -- -- 8.1*  MONOABS -- -- -- 2.7*  EOSABS -- -- -- 0.4  BASOSABS -- -- -- 0.0  BANDABS -- -- -- --    Lab 06/26/11 2000 06/26/11 1858 06/26/11 1127  NA 137 136 --  K 3.3* 3.2* --  CL 103 98 --  CO2 -- 24 --  GLUCOSE 102* 104* --  BUN 60* 60* 61*  CREATININE 5.10* 4.47* 5.0*  CALCIUM -- 14.5* --  MG -- -- --    Lab 06/27/11 1627  INR 1.16  PROTIME --   Cardiac markers: No results found for this basename: CK:3,CKMB:3,TROPONINI:3,MYOGLOBIN:3 in the last 168 hours No results found for this basename: POCBNP:3 in the last 168 hours Recent Results (from the past 240  hour(s))  MRSA PCR SCREENING     Status: Normal   Collection Time   06/27/11  6:25 PM      Component Value Range Status Comment   MRSA by PCR NEGATIVE  NEGATIVE  Final     Studies/Results: No results found.  Medications: Scheduled Meds:   . aspirin  81 mg Oral Daily  . enoxaparin (LOVENOX) injection  30 mg Subcutaneous Q24H  . multivitamins ther. w/minerals  1 tablet Oral Daily  . pamidronate  90 mg Intravenous Once  . pantoprazole (PROTONIX) IV  40 mg Intravenous Q12H  . simvastatin  20 mg Oral q1800  . DISCONTD: enoxaparin  30 mg Subcutaneous Q24H  . DISCONTD: multivitamin  1 tablet Oral Daily  . DISCONTD: multivitamins ther. w/minerals  1 tablet Oral Daily  . DISCONTD: simvastatin  20 mg Oral QHS   Continuous Infusions:   . sodium chloride 125 mL/hr at 06/27/11 1800   PRN Meds:.acetaminophen, acetaminophen, HYDROmorphone, ondansetron (ZOFRAN) IV, ondansetron  Assessment/Plan:  Principal  Problem:  *Hypercalcemia - most likely secondary to malignancy - continue IVF and pamidronate for now - follow up on BMP in AM  Active Problems:  ARF (acute renal failure) - unclear what the exact etiology at this time - continue IVF - strict I's and O's - BMP in AM - renal US   Hx of NEOP, MALIGNANT, PROSTATE - will consult heme/onc and follow up on further recommendations   Lumbar compression fracture - continue pain control for now   Anemia due to chronic illness - Hg drop noted - follow up on heme/onc recommendations   Thrombocytopenia - no active signs of bleeding - follow up on CBC in Am   Hypokalemia - supplement and check Magnesium level   HYPERTENSION - well controlled    LOS: 1 day   [name] 06/27/2011, 11:18 PM

## 2011-06-27 NOTE — ED Notes (Signed)
Pt was sitting on side of bed urinating in a urinal.  He knelt down to put the urinal on the floor, got caught in the monitor wires and fell on the ground and called out.  Pt was sitting on floor when I arrived.  No loc and denies hitting head.  No bruising/deformities noted.  Pt denies pain.  Charge RN notified and Md to be notified.

## 2011-06-27 NOTE — Consult Note (Signed)
Reason for Referral: Thrombocytopenia, Prostate Cancer   Chief Complaint  Patient presents with  . Back Pain  . Dehydration    HPI: This is a 65 y.o. male with a history of prostate cancer diagnosed in 2008 and is S/P Prostatectomy. His tumor at that time showed Gleason score 4+3=7 adenocarcinoma. 6/6 lymph nodes are negative at that time.  Patient did  Not receive any adjuvant therapy at that time. Last PSA PSA have been rising mildly (0.19 on 11/12).   Patient was  Sent to the  Emergency Dpartment due to Abnormal labs which included an elevated BUN and Creatinine but also an elevated Calcium level. Patient has had severe back pain for the past month and was found to have a compression fractures and was to have a kyphoplasty performed by Interventional Radiology. As part of that workup he had labs performed and was to have an MRI, the labs returned as mentioned above. Repeat of the laboratory studies in the Emergency Department revealed a Calcium level of 14.5, and a BUN = 60 and a Creatinine of 5.10.  Over the past month he reports having severe indigestion, and poor appetite and unintentional weight loss of 25 pounds. He has also had the back pain. His significant other reports that he has also had increased confusion as well. He denies having fevers, or chills, or night sweats or cough or chest pain.   Upon my evaluation,  Patient report feeling better after hydration. He did not report LE weakness. No neuro deficits noted at this time.   Past Medical History  Diagnosis Date  . GERD (gastroesophageal reflux disease)   . HTN (hypertension)   . Allergic rhinitis   . Prostate cancer 2008    s/p prostatectomy  :  Past Surgical History  Procedure Date  . Prostatectomy 04/06/07  . Kidney stone surgery   . Kidney stone surgery 06/2009    Stone removed from bladder  :  Current facility-administered medications:0.9 %  sodium chloride infusion, , Intravenous, Continuous, Ron Parker, MD, Last Rate: 125 mL/hr at 06/27/11 0843;  acetaminophen (TYLENOL) suppository 650 mg, 650 mg, Rectal, Q6H PRN, Ron Parker, MD;  acetaminophen (TYLENOL) tablet 650 mg, 650 mg, Oral, Q6H PRN, Ron Parker, MD;  aspirin chewable tablet 81 mg, 81 mg, Oral, Daily, Ron Parker, MD, 81 mg at 06/27/11 1022 enoxaparin (LOVENOX) injection 30 mg, 30 mg, Subcutaneous, Q24H, Harvette C Jenkins, MD, 30 mg at 06/27/11 1018;  fentaNYL (SUBLIMAZE) injection 50 mcg, 50 mcg, Intravenous, Once, Gavin Pound. Ghim, MD, 50 mcg at 06/26/11 2033;  HYDROmorphone (DILAUDID) injection 0.5-1 mg, 0.5-1 mg, Intravenous, Q3H PRN, Ron Parker, MD;  multivitamins ther. w/minerals tablet 1 tablet, 1 tablet, Oral, Daily, Christian M Buettner, PHARMD ondansetron (ZOFRAN) injection 4 mg, 4 mg, Intravenous, Q6H PRN, Ron Parker, MD;  ondansetron (ZOFRAN) tablet 4 mg, 4 mg, Oral, Q6H PRN, Ron Parker, MD;  pamidronate (AREDIA) 90 mg in sodium chloride 0.9 % 500 mL IVPB, 90 mg, Intravenous, Once, Ron Parker, MD, 90 mg at 06/26/11 2225;  pantoprazole (PROTONIX) injection 40 mg, 40 mg, Intravenous, Q12H, Ron Parker, MD, 40 mg at 06/27/11 1027 simvastatin (ZOCOR) tablet 20 mg, 20 mg, Oral, q1800, Ron Parker, MD;  sodium chloride 0.9 % bolus 500 mL, 500 mL, Intravenous, Once, Gavin Pound. Ghim, MD, 1,000 mL at 06/26/11 2035;  DISCONTD: 0.9 %  sodium chloride infusion, , Intravenous, Once, Gavin Pound. Oletta Lamas, MD;  DISCONTD: enoxaparin (LOVENOX) injection 30 mg, 30 mg, Subcutaneous, Q24H, Harvette C Jenkins, MD DISCONTD: enoxaparin (LOVENOX) injection 40 mg, 40 mg, Subcutaneous, Q24H, Ron Parker, MD;  DISCONTD: multivitamin tablet 1 tablet, 1 tablet, Oral, Daily, Ron Parker, MD;  DISCONTD: multivitamins ther. w/minerals tablet 1 tablet, 1 tablet, Oral, Daily, Ron Parker, MD, 1 tablet at 06/27/11 1022;  DISCONTD: simvastatin (ZOCOR) tablet 20 mg, 20 mg, Oral, QHS,  Ron Parker, MD Current outpatient prescriptions:aspirin 81 MG chewable tablet, Chew 81 mg by mouth daily.  , Disp: , Rfl: ;  Multiple Vitamin (MULTIVITAMIN) tablet, Take 1 tablet by mouth daily.  , Disp: , Rfl: ;  simvastatin (ZOCOR) 40 MG tablet, Take 20 mg by mouth at bedtime.  , Disp: , Rfl: :     . aspirin  81 mg Oral Daily  . enoxaparin (LOVENOX) injection  30 mg Subcutaneous Q24H  . fentaNYL  50 mcg Intravenous Once  . multivitamins ther. w/minerals  1 tablet Oral Daily  . pamidronate  90 mg Intravenous Once  . pantoprazole (PROTONIX) IV  40 mg Intravenous Q12H  . simvastatin  20 mg Oral q1800  . sodium chloride  500 mL Intravenous Once  . DISCONTD: sodium chloride   Intravenous Once  . DISCONTD: enoxaparin  30 mg Subcutaneous Q24H  . DISCONTD: enoxaparin  40 mg Subcutaneous Q24H  . DISCONTD: multivitamin  1 tablet Oral Daily  . DISCONTD: multivitamins ther. w/minerals  1 tablet Oral Daily  . DISCONTD: simvastatin  20 mg Oral QHS  :  Allergies  Allergen Reactions  . Celebrex (Celecoxib) Nausea Only  . Meloxicam Itching  . Percocet (Oxycodone-Acetaminophen) Itching  . Vicodin (Hydrocodone-Acetaminophen)   :  Family History  Problem Relation Age of Onset  . Pneumonia Mother   . Stroke Father   . Cancer Brother     lung  . Cancer Sister     bladder  :  History   Social History  . Marital Status: Single    Spouse Name: N/A    Number of Children: N/A  . Years of Education: N/A   Occupational History  . 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 Smoking status: Former Smoker    Quit date: 07/27/1996  . Smokeless tobacco: Not on file  . Alcohol Use: Yes     weekly  . Drug Use: No  . Sexually Active: Not on file   Other Topics Concern  . Not on file   Social History Narrative  . No narrative on file  :  A comprehensive review of systems was negative.  Exam: Patient Vitals for the past 24 hrs:  BP Temp Temp src  Pulse Resp SpO2  06/27/11 1406 140/69 mmHg - - - 20  94 %  06/27/11 1141 152/71 mmHg - - 87  20  96 %  06/27/11 1032 155/70 mmHg - - 96  20  95 %  06/27/11 0837 147/67 mmHg - - 96  20  93 %  06/27/11 0531 136/94 mmHg 98.7 F (37.1 C) Oral 95  19  95 %  06/27/11 0100 153/67 mmHg - - 97  17  93 %  06/27/11 0030 156/71 mmHg - - 97  16  93 %  06/27/11 0000 - - - 96  18  91 %  06/26/11 2330 153/73 mmHg - - 103  18  93 %  06/26/11 2300 165/72 mmHg - - 98  17  94 %  06/26/11 2235 161/70 mmHg 98.1 F (36.7 C) Oral 90  17  95 %  06/26/11 2230 161/70 mmHg - - - 15  -  06/26/11 2200 157/72 mmHg - - - 14  -  06/26/11 2130 170/72 mmHg - - - 14  -  06/26/11 2120 171/74 mmHg - - 97  15  96 %  06/26/11 2100 156/69 mmHg - - - 21  -  06/26/11 2052 173/72 mmHg - - 100  24  -  06/26/11 1823 154/84 mmHg - - 92  - 96 %  06/26/11 1617 166/78 mmHg 97.9 F (36.6 C) Oral 108  20  96 %   General appearance: alert Head: Normocephalic, without obvious abnormality, atraumatic Eyes: conjunctivae/corneas clear. PERRL, EOM's intact. Fundi benign. Throat: lips, mucosa, and tongue normal; teeth and gums normal Neck: no adenopathy, no carotid bruit, no JVD, supple, symmetrical, trachea midline and thyroid not enlarged, symmetric, no tenderness/mass/nodules Resp: clear to auscultation bilaterally Cardio: regular rate and rhythm, S1, S2 normal, no murmur, click, rub or gallop GI: soft, non-tender; bowel sounds normal; no masses,  no organomegaly Extremities: extremities normal, atraumatic, no cyanosis or edema Skin: Skin color, texture, turgor normal. No rashes or lesions Lymph nodes: Cervical, supraclavicular, and axillary nodes normal.   Basename 06/27/11 0500 06/26/11 2000 06/26/11 1941  WBC 17.7* -- 19.4*  HGB 9.2* 9.2* --  HCT 26.5* 27.0* --  PLT 21* -- 22*    Basename 06/26/11 2000 06/26/11 1858  NA 137 136  K 3.3* 3.2*  CL 103 98  CO2 -- 24  GLUCOSE 102* 104*  BUN 60* 60*  CREATININE 5.10* 4.47*    CALCIUM -- 14.5*   Lab Results  Component Value Date   WBC 17.7* 06/27/2011   HGB 9.2* 06/27/2011   HCT 26.5* 06/27/2011   MCV 89.8 06/27/2011   PLT 21* 06/27/2011    Lab 06/26/11 2000 06/26/11 1858  NA 137 --  K 3.3* --  CL 103 --  CO2 -- 24  BUN 60* --  CREATININE 5.10* --  CALCIUM -- 14.5*  PROT -- --  BILITOT -- --  ALKPHOS -- --  ALT -- --  AST -- --  GLUCOSE 102* --     Blood smear review: nucleated RBC  Noted. No fragmentation or schistocytes. No platelet clumping noted.   Dg Lumbar Spine Complete  06/10/2011  *RADIOLOGY REPORT*  Clinical Data: Back pain and groin pain.  Elevated PSA.  LUMBAR SPINE - COMPLETE 4+ VIEW  Comparison: The scout image for CT scan of the abdomen dated 10/24/2008  Findings: There is a mild compression fracture of the superior endplate of L3, new since 10/24/2008.  The patient has moderate degenerative disc disease and facet arthritis at L4-5.  This has progressed slightly.  Benign bone island in the right ilium, unchanged.  IMPRESSION: New mild compression fracture of the superior plate of L3.  Progressive degenerative disc and joint disease at L4-5.  No evidence suggestive of blastic metastatic disease.  Original Report Authenticated By: Gwynn Burly, M.D.    Assessment and Plan:  65 year old with the following issues  1. Prostate cancer: diagnosed in 2008, S/P prostatectomy. No evidence to suggest relapse till this point. He does have a compression fracture which is unsual for a man his age without metastatic cancer:  I would repeat PSA I agree with obtaining MRI once feasible from renal stand point. If can't be done soon, bone scan then will appropriate.  2. Thrombocytopenia: unclear etiology. Nothing on the smear to suggest DIC, TTP, HUS. Likely due to infiltrative malignancy into the bone marrow (nucleated RBC suggest that)  I would obtain LDH, Haptoglobin, DIC panel, Retic count.   3. Hypercalcemia: I agree with Pamidronate and  IVF. The etiology unclear but likely related to metastatic prostate cancer (we need to prove that though)  4. Compression Fracture. Please see #1  5. Anemia: Likely due to acute illness and renal failure.  6. ARF: likely related to to hypercalcemia and ?  pre-renal etiology. Consider a renal consult.

## 2011-06-27 NOTE — ED Notes (Signed)
Pt lunch has been ordered.  ?

## 2011-06-27 NOTE — ED Notes (Signed)
Dr. Lelon Mast paged and answered call; will clarify admission orders and bed type needed.

## 2011-06-27 NOTE — ED Notes (Signed)
Pt was moved to a hospital bed. Pt is resting at this time

## 2011-06-27 NOTE — ED Notes (Signed)
Pt went to the bathroom, and is now back in bed and hooked to back up to the monitor, pulse ox and BP. Pt has family at bedside.

## 2011-06-27 NOTE — ED Notes (Signed)
Pt sleeping.  Called flo.  Stated that pt first on list to get a step-down bed.

## 2011-06-27 NOTE — ED Notes (Signed)
Pt is resting at this time. Hospital bed was ordered for pt

## 2011-06-27 NOTE — ED Notes (Signed)
Pt is resting at this time

## 2011-06-28 ENCOUNTER — Inpatient Hospital Stay (HOSPITAL_COMMUNITY): Payer: BC Managed Care – PPO

## 2011-06-28 LAB — BASIC METABOLIC PANEL
BUN: 47 mg/dL — ABNORMAL HIGH (ref 6–23)
CO2: 24 mEq/L (ref 19–32)
Calcium: 12.8 mg/dL — ABNORMAL HIGH (ref 8.4–10.5)
Chloride: 105 mEq/L (ref 96–112)
Creatinine, Ser: 4.48 mg/dL — ABNORMAL HIGH (ref 0.50–1.35)

## 2011-06-28 LAB — CBC
HCT: 22.9 % — ABNORMAL LOW (ref 39.0–52.0)
MCH: 31.6 pg (ref 26.0–34.0)
MCV: 90.5 fL (ref 78.0–100.0)
Platelets: 16 10*3/uL — CL (ref 150–400)
RBC: 2.53 MIL/uL — ABNORMAL LOW (ref 4.22–5.81)
RDW: 13 % (ref 11.5–15.5)

## 2011-06-28 LAB — MAGNESIUM: Magnesium: 2.7 mg/dL — ABNORMAL HIGH (ref 1.5–2.5)

## 2011-06-28 MED ORDER — POTASSIUM CHLORIDE CRYS ER 20 MEQ PO TBCR
40.0000 meq | EXTENDED_RELEASE_TABLET | Freq: Once | ORAL | Status: AC
  Administered 2011-06-28: 40 meq via ORAL
  Filled 2011-06-28: qty 2

## 2011-06-28 MED ORDER — PANTOPRAZOLE SODIUM 40 MG PO TBEC
40.0000 mg | DELAYED_RELEASE_TABLET | Freq: Every day | ORAL | Status: DC
  Administered 2011-06-29: 40 mg via ORAL
  Filled 2011-06-28: qty 1

## 2011-06-28 NOTE — Progress Notes (Addendum)
Patient ID: [name], male   DOB: Mar 25, 1946, 65 y.o.   MRN: [id number]  Subjective: No events overnight. Patient denies chest pain, shortness of breath, abdominal pain.   Objective:  Vital signs in last 24 hours:  Filed Vitals:   06/27/11 2000 06/28/11 0000 06/28/11 0500 06/28/11 0800  BP: 143/73 147/57 125/66 135/63  Pulse: 90 91  91  Temp: 98 F (36.7 C) 98.8 F (37.1 C) 98.3 F (36.8 C) 97.7 F (36.5 C)  TempSrc: Oral Oral Oral Oral  Resp: 21 15  19   Height:      Weight:      SpO2: 92% 98%  97%    Intake/Output from previous day:   Intake/Output Summary (Last 24 hours) at 06/28/11 9147 Last data filed at 06/28/11 0800  Gross per 24 hour  Intake   1875 ml  Output    350 ml  Net   1525 ml    Physical Exam: General: Alert, awake, oriented x3, in no acute distress. HEENT: No bruits, no goiter. Dry mucous membranes, no scleral icterus, no conjunctival pallor. Heart: Regular rate and rhythm, without murmurs, rubs, gallops. Lungs: Clear to auscultation bilaterally. No wheezing, no rhonchi, no rales. Non productive cough throughout the examination. Abdomen: Soft, nontender, nondistended, positive bowel sounds. Extremities: No clubbing cyanosis or edema,  positive pedal pulses. Neuro: Grossly intact, nonfocal.  Lab Results:  Basic Metabolic Panel:    Component Value Date/Time   NA 138 06/28/2011 0730   K 3.1* 06/28/2011 0730   CL 105 06/28/2011 0730   CO2 24 06/28/2011 0730   BUN 47* 06/28/2011 0730   CREATININE 4.48* 06/28/2011 0730   GLUCOSE 101* 06/28/2011 0730   CALCIUM 12.8* 06/28/2011 0730   CBC:    Component Value Date/Time   WBC 12.6* 06/28/2011 0730   HGB 8.0* 06/28/2011 0730   HCT 22.9* 06/28/2011 0730   PLT 16* 06/28/2011 0730   MCV 90.5 06/28/2011 0730   NEUTROABS 8.2* 06/26/2011 1941   LYMPHSABS 8.1* 06/26/2011 1941   MONOABS 2.7* 06/26/2011 1941   EOSABS 0.4 06/26/2011 1941   BASOSABS 0.0 06/26/2011 1941      Lab 06/28/11 0730 06/27/11 1627  06/27/11 0500 06/26/11 2000 06/26/11 1941  WBC 12.6* -- 17.7* -- 19.4*  HGB 8.0* -- 9.2* 9.2* 9.7*  HCT 22.9* -- 26.5* 27.0* 27.0*  PLT 16* 23* 21* -- 22*  MCV 90.5 -- 89.8 -- 88.8  MCH 31.6 -- 31.2 -- 31.9  MCHC 34.9 -- 34.7 -- 35.9  RDW 13.0 -- 12.7 -- 12.6  LYMPHSABS -- -- -- -- 8.1*  MONOABS -- -- -- -- 2.7*  EOSABS -- -- -- -- 0.4  BASOSABS -- -- -- -- 0.0  BANDABS -- -- -- -- --    Lab 06/28/11 0730 06/28/11 0016 06/26/11 2000 06/26/11 1858 06/26/11 1127  NA 138 -- 137 136 --  K 3.1* -- 3.3* 3.2* --  CL 105 -- 103 98 --  CO2 24 -- -- 24 --  GLUCOSE 101* -- 102* 104* --  BUN 47* -- 60* 60* 61*  CREATININE 4.48* -- 5.10* 4.47* 5.0*  CALCIUM 12.8* -- -- 14.5* --  MG -- 2.7* -- -- --    Lab 06/27/11 1627  INR 1.16  PROTIME --   Cardiac markers: No results found for this basename: CK:3,CKMB:3,TROPONINI:3,MYOGLOBIN:3 in the last 168 hours No results found for this basename: POCBNP:3 in the last 168 hours Recent Results (from the past 240 hour(s))  MRSA PCR  SCREENING     Status: Normal   Collection Time   06/27/11  6:25 PM      Component Value Range Status Comment   MRSA by PCR NEGATIVE  NEGATIVE  Final     Studies/Results: No results found.  Medications: Scheduled Meds:   . aspirin  81 mg Oral Daily  . enoxaparin (LOVENOX) injection  30 mg Subcutaneous Q24H  . multivitamins ther. w/minerals  1 tablet Oral Daily  . pantoprazole (PROTONIX) IV  40 mg Intravenous Q12H  . potassium chloride  40 mEq Oral Once  . simvastatin  20 mg Oral q1800  . DISCONTD: multivitamin  1 tablet Oral Daily  . DISCONTD: multivitamins ther. w/minerals  1 tablet Oral Daily  . DISCONTD: simvastatin  20 mg Oral QHS   Continuous Infusions:   . sodium chloride 125 mL/hr at 06/28/11 0800   PRN Meds:.acetaminophen, acetaminophen, HYDROmorphone, ondansetron (ZOFRAN) IV, ondansetron  Assessment/Plan:  Principal Problem:  *Hypercalcemia - calcium trending down - will continue IVF and  Pamidronate - obtain BMP in AM  Active Problems:  ARF (acute renal failure) - most likely secondary to prerenal etiology along with hypercalcemia - creatinine trending down - continue IVF and obtain BMP in AM, renal US pending - if creatinine worse in AM will consult nephrology - follow I's and O's, pt maintaining good urine output to this point   Lumbar compression fracture - continue analgesia for adequate pain control - per pt pain is well controlled   Anemia due to chronic illness - Hg dropping but may be also in the setting of IVF/dilutional - panel obtained yesterday not suggestive of hemolysis - follow up on CBC in AM and if Hg < 8.0 consider transfusion   Thrombocytopenia - platelet drop noted since yesterday - follow up on oncology recommendations, appreciate input   Hypokalemia - magnesium level is within normal limits - continue to supplement   HYPERTENSION - well controlled   GERD - continue protonix but switch to PO   Disposition - transfer to regular floor today   LOS: 2 days   MAGICK-Laketia Vicknair 06/28/2011, 9:29 AM

## 2011-06-28 NOTE — Progress Notes (Signed)
Pt. Transferred to 6700 per w/c accompanied by NT and wife. Alert and oriented . Oriented to unit set-up. Needs attended to. Konrad Dolores RN

## 2011-06-28 NOTE — Progress Notes (Signed)
Patient transferred to 6729. Report called to RN on 6700 and all questions answered and family aware of new room assignment. Patient transferred via wheelchair with NT.

## 2011-06-29 ENCOUNTER — Encounter: Payer: Self-pay | Admitting: Family Medicine

## 2011-06-29 ENCOUNTER — Inpatient Hospital Stay (HOSPITAL_COMMUNITY): Payer: BC Managed Care – PPO

## 2011-06-29 LAB — BASIC METABOLIC PANEL
BUN: 43 mg/dL — ABNORMAL HIGH (ref 6–23)
CO2: 21 mEq/L (ref 19–32)
Calcium: 12.2 mg/dL — ABNORMAL HIGH (ref 8.4–10.5)
Chloride: 108 mEq/L (ref 96–112)
Creatinine, Ser: 4.48 mg/dL — ABNORMAL HIGH (ref 0.50–1.35)
Glucose, Bld: 95 mg/dL (ref 70–99)

## 2011-06-29 LAB — CBC
HCT: 21.6 % — ABNORMAL LOW (ref 39.0–52.0)
Hemoglobin: 7.6 g/dL — ABNORMAL LOW (ref 13.0–17.0)
MCH: 31.9 pg (ref 26.0–34.0)
MCHC: 35.2 g/dL (ref 30.0–36.0)
MCV: 90.8 fL (ref 78.0–100.0)
Platelets: 15 10*3/uL — CL (ref 150–400)
RBC: 2.38 MIL/uL — ABNORMAL LOW (ref 4.22–5.81)
RDW: 13.1 % (ref 11.5–15.5)
WBC: 10.8 10*3/uL — ABNORMAL HIGH (ref 4.0–10.5)

## 2011-06-29 LAB — SODIUM, URINE, RANDOM: Sodium, Ur: 56 mEq/L

## 2011-06-29 LAB — CREATININE, URINE, RANDOM: Creatinine, Urine: 51.63 mg/dL

## 2011-06-29 MED ORDER — HYDROCODONE-ACETAMINOPHEN 5-500 MG PO TABS: 1.0000 | ORAL_TABLET | Freq: Four times a day (QID) | ORAL | Status: DC | PRN

## 2011-06-29 MED ORDER — POTASSIUM CHLORIDE CRYS ER 20 MEQ PO TBCR: 40.0000 meq | EXTENDED_RELEASE_TABLET | ORAL | Status: DC

## 2011-06-29 NOTE — Progress Notes (Signed)
Physical Therapy Evaluation Patient Details Name: [name] MRN: [id number] DOB: July 19, 1946 Today's Date: 06/29/2011  Problem List:  Patient Active Problem List  Diagnoses  . Hx of NEOP, MALIGNANT, PROSTATE  . HYPERTRIGLYCERIDEMIA  . OBESITY  . BENIGN POSITIONAL VERTIGO  . HYPERTENSION  . GERD  . PARESTHESIA  . RECTAL MASS  . ALLERGIC RHINITIS  . Lumbar compression fracture  . Hypercalcemia  . Anemia due to chronic illness  . ARF (acute renal failure)  . Thrombocytopenia  . Hypokalemia    Past Medical History:  Past Medical History  Diagnosis Date  . GERD (gastroesophageal reflux disease)   . HTN (hypertension)   . Allergic rhinitis   . Prostate cancer 2008    s/p prostatectomy   Past Surgical History:  Past Surgical History  Procedure Date  . Prostatectomy 04/06/07  . Kidney stone surgery   . Kidney stone surgery 06/2009    Stone removed from bladder    PT Assessment/Plan/Recommendation PT Assessment Clinical Impression Statement: Pt. with decreased mobility secondary to back pain.  Pt. independent with bed mobility and transfers requiring supervision for ambulation and navigation of stairs with straight cane.  Pt. noted to have mildly unsteady gait, and RW recommended to increase stability and indepedence at home, as well as a shower seat to be able to safely wash the lower extremities.  Instructed pt. on back precautions which he was able to follow when performing bed mobility, and also educated on proper adjustment of straight cane.  Pt. could benefit from  skilled PT in the acute setting to increase independence with mobility upon return to home.  PT Recommendation/Assessment: Patient will need skilled PT in the acute care venue PT Problem List: Decreased balance;Decreased knowledge of use of DME;Decreased knowledge of precautions Barriers to Discharge: None PT Therapy Diagnosis : Difficulty walking;Abnormality of gait PT Plan PT Frequency: Min 3X/week PT  Treatment/Interventions: DME instruction;Gait training;Stair training;Patient/family education PT Recommendation Follow Up Recommendations: 24 hour supervision/assistance Equipment Recommended: Rolling walker with 5" wheels;Tub/shower seat PT Goals  Acute Rehab PT Goals PT Goal Formulation: With patient Time For Goal Achievement: 7 days Pt will Ambulate: >150 feet;with modified independence;with rolling walker (500 feet for community ambulation) PT Goal: Ambulate - Progress: Other (comment) Pt will Go Up / Down Stairs: Flight;with modified independence;with rail(s);with cane PT Goal: Up/Down Stairs - Progress: Other (comment)  PT Evaluation Precautions/Restrictions  Precautions Precautions: Fall;Back Precaution Booklet Issued: No Prior Functioning  Home Living Lives With: Significant other Type of Home: House Home Layout: Two level Alternate Level Stairs-Rails: Right Alternate Level Stairs-Number of Steps: Flight Home Access: Stairs to enter Entrance Stairs-Rails: None Entrance Stairs-Number of Steps: 1 Bathroom Shower/Tub: Forensic scientist: Standard Bathroom Accessibility: Yes How Accessible: Accessible via walker Home Adaptive Equipment: Straight cane Prior Function Level of Independence: Independent with basic ADLs;Independent with homemaking with ambulation;Requires assistive device for independence;Independent with transfers Driving: Yes Vocation: Retired Producer, television/film/video: Awake/alert Overall Cognitive Status: No family/caregiver present to determine baseline cognitive functioning Memory: Appears impaired Memory Deficits: Appeared to be some minor short term memory deficits.  Orientation Level: Oriented X4 Sensation/Coordination   Extremity Assessment RLE Assessment RLE Assessment: Within Functional Limits LLE Assessment LLE Assessment: Within Functional Limits Mobility (including Balance) Bed Mobility Bed Mobility:  Yes Rolling Right: 7: Independent Right Sidelying to Sit: 7: Independent;HOB flat Sit to Supine - Right: 7: Independent;HOB flat Transfers Transfers: Yes Sit to Stand: 7: Independent;From bed Stand to Sit: 7: Independent;To bed Ambulation/Gait Ambulation/Gait: Yes  Ambulation/Gait Assistance: 5: Supervision Ambulation/Gait Assistance Details (indicate cue type and reason): Supervision for safety.  Ambulation Distance (Feet): 300 Feet Assistive device: Straight cane Gait Pattern: Step-through pattern (Mildly unsteady) Stairs: Yes Stairs Assistance: 5: Supervision Stairs Assistance Details (indicate cue type and reason): Supervision for safety.  Stair Management Technique: One rail Right;With cane;Forwards;Step to pattern Number of Stairs: 5  Height of Stairs: 8     Exercise    End of Session PT - End of Session Equipment Utilized During Treatment: Gait belt Activity Tolerance: Patient tolerated treatment well Patient left: in bed;with call bell in reach Nurse Communication: Mobility status for transfers;Mobility status for ambulation General Behavior During Session: [facility] for tasks performed Cognition: Impaired Cognitive Impairment: Short term memory deficits.  Laney Pastor, SPT  06/29/2011, 11:54 AM

## 2011-06-29 NOTE — Discharge Summary (Signed)
Patient ID: [name] MRN: [id number] DOB/AGE: 1946-02-15 65 y.o.  Admit date: 06/26/2011 Discharge date: 06/29/2011  Primary Care Physician:  [name], MD, MD  Discharge Diagnoses:    Present on Admission:  .Hx of NEOP, MALIGNANT, PROSTATE .HYPERTENSION .GERD .Lumbar compression fracture .Hypercalcemia .Anemia due to chronic illness .ARF (acute renal failure) .Thrombocytopenia .Hypokalemia  Principal Problem:  *Hypercalcemia Active Problems:  ARF (acute renal failure)  Hx of NEOP, MALIGNANT, PROSTATE  Lumbar compression fracture  Anemia due to chronic illness  Thrombocytopenia  Hypokalemia  HYPERTENSION  GERD   Current Discharge Medication List    CONTINUE these medications which have CHANGED   Details  HYDROcodone-acetaminophen (VICODIN) 5-500 MG per tablet Take 1 tablet by mouth every 6 (six) hours as needed for pain. Qty: 20 tablet, Refills: 0      CONTINUE these medications which have NOT CHANGED   Details  aspirin 81 MG chewable tablet Chew 81 mg by mouth daily.      Multiple Vitamin (MULTIVITAMIN) tablet Take 1 tablet by mouth daily.      simvastatin (ZOCOR) 40 MG tablet Take 20 mg by mouth at bedtime.        STOP taking these medications     CELEBREX 200 MG capsule      cyclobenzaprine (FLEXERIL) 5 MG tablet      hydrochlorothiazide (,MICROZIDE/HYDRODIURIL,) 12.5 MG capsule         Disposition and Follow-up: Pt will need to see PCP in approximately 2 - 4 weeks and have blood work obtained to follow up on calcium and creatinine as well as hemoglobin. I have discussed with pt that he also needs to see his urologist and if he does not see PCP he must see urologist so that these tests can be obtained for appropriate determination of further management and care. Pt did not want to stay in the hospital for further evaluation but Dr. Hyman Hopes was consulted (nephrology specialist) and has recommended that pt sees also urologist and follow up on  calcium and creatinine and if needed [facility] can be contacted for an appointment schedule and further evaluation. Appointment can be scheduled with Dr. Hyman Hopes or you can reach him over the phone for further questions.  Consults:  renal  Significant Diagnostic Studies:  Renal US - chronic medical renal disease  Brief H and P: [name] is an 65 y.o. male with a history of prostate cancer diagnosed in 2008 and is S/P Prostatectomy who was sent to the Emergency Dpartment due to Abnormal labs which included an elevated BUN and Creatinine but also an elevated Calcium level. Patient has had severe back pain for the past month and was found to have a compression fractures and was to have a kyphoplasty performed by Interventional Radiology. As part of that workup he had labs performed and was to have an MRI, the labs returned as mentioned above. Repeat of the laboratory studies in the Emergency Department revealed a Calcium level of 14.5, and a BUN = 60 and a Creatinine of 5.10.  Over the past month he reports having severe indigestion, and poor appetite and unintentional weight loss of 25 pounds. He has also had the back pain. His significant other reports that he has also had increased confusion as well. He denies having fevers, or chills, or night sweats or cough or chest pain.    Physical Exam on Discharge:  Filed Vitals:   06/28/11 1500 06/28/11 2119 06/29/11 0512 06/29/11 1019  BP: 138/62  158/68 134/66 162/61  Pulse: 86 88 90 91  Temp: 97.6 F (36.4 C) 97.2 F (36.2 C) 97.8 F (36.6 C) 97.9 F (36.6 C)  TempSrc: Oral Oral Oral Oral  Resp: 19 20 20 20   Height:  6\' 3"  (1.905 m)    Weight: 106.6 kg (235 lb 0.2 oz) 100.245 kg (221 lb)    SpO2: 94% 95% 94% 95%     Intake/Output Summary (Last 24 hours) at 06/29/11 1318 Last data filed at 06/29/11 0900  Gross per 24 hour  Intake   2600 ml  Output   1650 ml  Net    950 ml    General: Alert, awake, oriented x3, in  no acute distress. HEENT: No bruits, no goiter. Heart: Regular rate and rhythm, without murmurs, rubs, gallops. Lungs: Clear to auscultation bilaterally. Abdomen: Soft, nontender, nondistended, positive bowel sounds. Extremities: No clubbing cyanosis or edema with positive pedal pulses. Neuro: Grossly intact, nonfocal.  CBC:    Component Value Date/Time   WBC 10.8* 06/29/2011 0600   HGB 7.6* 06/29/2011 0600   HCT 21.6* 06/29/2011 0600   PLT 15* 06/29/2011 0600   MCV 90.8 06/29/2011 0600   NEUTROABS 8.2* 06/26/2011 1941   LYMPHSABS 8.1* 06/26/2011 1941   MONOABS 2.7* 06/26/2011 1941   EOSABS 0.4 06/26/2011 1941   BASOSABS 0.0 06/26/2011 1941    Basic Metabolic Panel:    Component Value Date/Time   NA 141 06/29/2011 0600   K 3.4* 06/29/2011 0600   CL 108 06/29/2011 0600   CO2 21 06/29/2011 0600   BUN 43* 06/29/2011 0600   CREATININE 4.48* 06/29/2011 0600   GLUCOSE 95 06/29/2011 0600   CALCIUM 12.2* 06/29/2011 0600    Hospital Course:  Principal Problem:  *Hypercalcemia - calcium level trending down - this needs to be followed up in an outpatient setting  Active Problems:  ARF (acute renal failure) - will need to have BMP in an outpatient setting for further evaluation since pt refused to stay longer in the hospital for full work up to be completed   Hx of NEOP, MALIGNANT, PROSTATE - will need to follow up with urologist   Anemia due to chronic illness - Hg/Hct remain stable and at pt's baseline   Thrombocytopenia - needs to be monitored closely with no transfusion recommended at this time   Hypokalemia - resolved   HYPERTENSION - well controlled    GERD - well controlled  Time spent on Discharge: Over 30 minutes  Signed: MAGICK-Brynnleigh Mcelwee 06/29/2011, 1:18 PM

## 2011-06-29 NOTE — Progress Notes (Signed)
Utilization Review Completed.[name] T12/09/2010   

## 2011-06-29 NOTE — Progress Notes (Signed)
Seen and agree with SPT note Enslee Bibbins Tabor, PT [phone]  

## 2011-06-29 NOTE — Progress Notes (Signed)
IP PROGRESS NOTE  Subjective:   Patient feels ok. No new complaints. No bleeding noted.  Objective:  Vital signs in last 24 hours: Temp:  [97.2 F (36.2 C)-98.4 F (36.9 C)] 97.9 F (36.6 C) (12/03 1019) Pulse Rate:  [86-95] 91  (12/03 1019) Resp:  [19-20] 20  (12/03 1019) BP: (134-162)/(61-68) 162/61 mmHg (12/03 1019) SpO2:  [94 %-96 %] 95 % (12/03 1019) Weight:  [221 lb (100.245 kg)-235 lb 0.2 oz (106.6 kg)] 221 lb (100.245 kg) (12/02 2119) Weight change: 14 lb 15.9 oz (6.8 kg) Last BM Date:  (06/27/11)  Intake/Output from previous day: 12/02 0701 - 12/03 0700 In: 3350 [P.O.:600; I.V.:2750] Out: 1650 [Urine:1650]  Mouth: mucous membranes moist, pharynx normal without lesions Resp: clear to auscultation bilaterally Cardio: regular rate and rhythm, S1, S2 normal, no murmur, click, rub or gallop GI: soft, non-tender; bowel sounds normal; no masses,  no organomegaly Extremities: extremities normal, atraumatic, no cyanosis or edema    Lab Results:  Basename 06/29/11 0600 06/28/11 0730  WBC 10.8* 12.6*  HGB 7.6* 8.0*  HCT 21.6* 22.9*  PLT 15* 16*    BMET  Basename 06/29/11 0600 06/28/11 0730  NA 141 138  K 3.4* 3.1*  CL 108 105  CO2 21 24  GLUCOSE 95 101*  BUN 43* 47*  CREATININE 4.48* 4.48*  CALCIUM 12.2* 12.8*    Studies/Results: Dg Chest 2 View  06/28/2011  *RADIOLOGY REPORT*  Clinical Data: Not prepped cough, fever.  CHEST - 2 VIEW  Comparison: 06/27/2009  Findings: Heart is normal size.  Mediastinal contours within normal size.  Diffuse interstitial prominence throughout the lungs.  Left base atelectasis.  No effusions.  No acute bony abnormality.  IMPRESSION: Mild peribronchial thickening and interstitial prominence, likely bronchitic changes.  Left base atelectasis.  Original Report Authenticated By: Cyndie Chime, M.D.   US Renal  06/29/2011  *RADIOLOGY REPORT*  Clinical Data: Acute renal failure.  RENAL/URINARY TRACT ULTRASOUND COMPLETE  Comparison:   CT abdomen dated 10/24/2008  Findings:  Right Kidney:  12.6 cm in length.  Small stone in the upper pole. No hydronephrosis.  Slight increase in the renal parenchyma echogenicity suggesting renal medical disease.  Left Kidney:  12.7 cm in length.  2.7 cm simple appearing cyst laterally in the midportion, slightly larger than on the prior study.  6 mm stone in the upper pole.  No hydronephrosis.  Slightly echogenic renal parenchyma suggesting renal medical disease.  Bladder:  Normal.  IMPRESSION: No obstruction.  Slight increased echogenicity of the renal parenchyma consistent with renal medical disease.  Bilateral renal stones.  Note is made of prominent sludge in the gallbladder.  Original Report Authenticated By: Gwynn Burly, M.D.    Medications: I have reviewed the patient's current medications.  Assessment/Plan:  65 year old with the following issues  1. Prostate cancer: diagnosed in 2008, S/P prostatectomy. No evidence to suggest relapse till this point. He does have a compression fracture which is unsual for a man his age without metastatic cancer:   PSA is very low at 0.22. This could mean no prostate cancer or a very poorly differentiated cancer that dose not produce PSA  I agree with obtaining MRI once feasible from renal stand point. If can't be done soon, bone scan then will appropriate.   2. Thrombocytopenia: unclear etiology. Nothing on the smear to suggest DIC, TTP, HUS. Likely due to infiltrative malignancy into the bone marrow (nucleated RBC suggest that)   No evidence to suggest  DIC to HUS (mild LDH elevation, no schistocytes)  He might need a bone marrow biopsy at some point.  I would Transfuse platelet only if he is bleeding or plat count <10,000.  3. Hypercalcemia: I agree with Pamidronate and IVF. The etiology unclear but likely related to metastatic prostate cancer (we need to prove that though)  4. Compression Fracture. Please see #1  5. Anemia: Likely due to acute  illness and renal failure.  6. ARF: likely related to to hypercalcemia and ? pre-renal etiology. Consider a renal consult. U/S shows no hydronephrosis.  Will follow with you.    LOS: 3 days   [name] 06/29/2011, 11:04 AM

## 2011-06-30 ENCOUNTER — Other Ambulatory Visit: Payer: BC Managed Care – PPO

## 2011-06-30 ENCOUNTER — Telehealth: Payer: Self-pay | Admitting: *Deleted

## 2011-06-30 LAB — HAPTOGLOBIN: Haptoglobin: 157 mg/dL (ref 30–200)

## 2011-06-30 NOTE — Telephone Encounter (Signed)
Herbert Seta, make sure this patient has hosp f/u with Dr. B set up this week to f/u on all these problems. Needs to have f/u check on kidney function and electrolytes.  Needs to make sure stable before kyphoplasty considered.  Cc: Dr. Ermalene Searing

## 2011-06-30 NOTE — Telephone Encounter (Signed)
[name] advised and patient has appt on Friday with dr Ermalene Searing

## 2011-06-30 NOTE — Telephone Encounter (Signed)
[name] [facility]) called stating that patient was discharged from [facility] yesterday. [name] wants to know when should patient be scheduled for his MRI and talk with radiologist about cryo-plasty procedure? [name] wants to know when patient's confusion should improve?   Patient is having a hard time operating the remote control and his telephone. [name] figures that they should hold off on the MRI until patient recovers from his hospital stay.  [name] is aware that Dr. Ermalene Searing is out of the office and is okay for this message to wait until she returns to the office.

## 2011-07-01 ENCOUNTER — Encounter: Payer: Self-pay | Admitting: Family Medicine

## 2011-07-02 ENCOUNTER — Telehealth: Payer: Self-pay | Admitting: Internal Medicine

## 2011-07-02 NOTE — Telephone Encounter (Signed)
Will check labs to eval nosebleeds at appt tommorow.. Please make sure it is a 30 min appt.   If any further nose bleeds that have bleeding lasting longer than 5 minutes.. Need to go to urgent care or ER for nose packing.  Other wise.. Do not blow nose, pcik rub etc. Do not apply nasal meds. Hold baby aspirin.

## 2011-07-02 NOTE — Telephone Encounter (Signed)
Patient advised.

## 2011-07-02 NOTE — Telephone Encounter (Signed)
[name] called and stated yesterday patient had intermittent nose bleeds sometimes heavy and sometimes light throughout the day.  Also stated she is having a hard time getting him to eat she has only been able to get him to eat one day this week and he is unbalanced.  He has an appointment tomorrow with you for a followup.

## 2011-07-03 ENCOUNTER — Ambulatory Visit (INDEPENDENT_AMBULATORY_CARE_PROVIDER_SITE_OTHER): Payer: BC Managed Care – PPO | Admitting: Family Medicine

## 2011-07-03 ENCOUNTER — Encounter (HOSPITAL_COMMUNITY): Payer: Self-pay | Admitting: Physical Medicine and Rehabilitation

## 2011-07-03 ENCOUNTER — Inpatient Hospital Stay (HOSPITAL_COMMUNITY)
Admission: EM | Admit: 2011-07-03 | Discharge: 2011-07-13 | DRG: 578 | Disposition: A | Discharge status: Home / Self Care | Payer: BC Managed Care – PPO | Attending: Internal Medicine | Admitting: Internal Medicine

## 2011-07-03 ENCOUNTER — Encounter: Payer: Self-pay | Admitting: Family Medicine

## 2011-07-03 DIAGNOSIS — N08 Glomerular disorders in diseases classified elsewhere: Secondary | ICD-10-CM

## 2011-07-03 DIAGNOSIS — E872 Acidosis, unspecified: Secondary | ICD-10-CM | POA: Diagnosis not present

## 2011-07-03 DIAGNOSIS — R198 Other specified symptoms and signs involving the digestive system and abdomen: Secondary | ICD-10-CM

## 2011-07-03 DIAGNOSIS — S32000A Wedge compression fracture of unspecified lumbar vertebra, initial encounter for closed fracture: Secondary | ICD-10-CM

## 2011-07-03 DIAGNOSIS — S32009A Unspecified fracture of unspecified lumbar vertebra, initial encounter for closed fracture: Secondary | ICD-10-CM

## 2011-07-03 DIAGNOSIS — D638 Anemia in other chronic diseases classified elsewhere: Secondary | ICD-10-CM | POA: Diagnosis present

## 2011-07-03 DIAGNOSIS — E781 Pure hyperglyceridemia: Secondary | ICD-10-CM

## 2011-07-03 DIAGNOSIS — D6959 Other secondary thrombocytopenia: Secondary | ICD-10-CM | POA: Diagnosis present

## 2011-07-03 DIAGNOSIS — K219 Gastro-esophageal reflux disease without esophagitis: Secondary | ICD-10-CM | POA: Diagnosis present

## 2011-07-03 DIAGNOSIS — I129 Hypertensive chronic kidney disease with stage 1 through stage 4 chronic kidney disease, or unspecified chronic kidney disease: Secondary | ICD-10-CM | POA: Diagnosis present

## 2011-07-03 DIAGNOSIS — E876 Hypokalemia: Secondary | ICD-10-CM

## 2011-07-03 DIAGNOSIS — D696 Thrombocytopenia, unspecified: Secondary | ICD-10-CM

## 2011-07-03 DIAGNOSIS — I10 Essential (primary) hypertension: Secondary | ICD-10-CM | POA: Diagnosis present

## 2011-07-03 DIAGNOSIS — N19 Unspecified kidney failure: Secondary | ICD-10-CM

## 2011-07-03 DIAGNOSIS — H8110 Benign paroxysmal vertigo, unspecified ear: Secondary | ICD-10-CM

## 2011-07-03 DIAGNOSIS — M8448XA Pathological fracture, other site, initial encounter for fracture: Secondary | ICD-10-CM | POA: Diagnosis present

## 2011-07-03 DIAGNOSIS — N179 Acute kidney failure, unspecified: Secondary | ICD-10-CM

## 2011-07-03 DIAGNOSIS — C61 Malignant neoplasm of prostate: Secondary | ICD-10-CM

## 2011-07-03 DIAGNOSIS — K921 Melena: Secondary | ICD-10-CM | POA: Diagnosis present

## 2011-07-03 DIAGNOSIS — E669 Obesity, unspecified: Secondary | ICD-10-CM

## 2011-07-03 DIAGNOSIS — D649 Anemia, unspecified: Secondary | ICD-10-CM

## 2011-07-03 DIAGNOSIS — C9000 Multiple myeloma not having achieved remission: Principal | ICD-10-CM | POA: Diagnosis present

## 2011-07-03 DIAGNOSIS — Z8546 Personal history of malignant neoplasm of prostate: Secondary | ICD-10-CM

## 2011-07-03 DIAGNOSIS — D62 Acute posthemorrhagic anemia: Secondary | ICD-10-CM | POA: Diagnosis present

## 2011-07-03 DIAGNOSIS — N189 Chronic kidney disease, unspecified: Secondary | ICD-10-CM | POA: Diagnosis present

## 2011-07-03 DIAGNOSIS — R040 Epistaxis: Secondary | ICD-10-CM | POA: Diagnosis present

## 2011-07-03 DIAGNOSIS — E785 Hyperlipidemia, unspecified: Secondary | ICD-10-CM | POA: Diagnosis present

## 2011-07-03 HISTORY — DX: Hyperlipidemia, unspecified: E78.5

## 2011-07-03 HISTORY — DX: Glomerular disorders in diseases classified elsewhere: C90.00

## 2011-07-03 HISTORY — DX: Glomerular disorders in diseases classified elsewhere: N08

## 2011-07-03 HISTORY — DX: Hypercalcemia: E83.52

## 2011-07-03 LAB — CBC
HCT: 18.3 % — ABNORMAL LOW (ref 39.0–52.0)
MCH: 32.1 pg (ref 26.0–34.0)
MCV: 87.6 fL (ref 78.0–100.0)
RDW: 13.3 % (ref 11.5–15.5)
WBC: 19.2 10*3/uL — ABNORMAL HIGH (ref 4.0–10.5)

## 2011-07-03 LAB — POCT I-STAT, CHEM 8
BUN: 58 mg/dL — ABNORMAL HIGH (ref 6–23)
Creatinine, Ser: 4.6 mg/dL — ABNORMAL HIGH (ref 0.50–1.35)
Potassium: 3.2 mEq/L — ABNORMAL LOW (ref 3.5–5.1)
Sodium: 139 mEq/L (ref 135–145)
TCO2: 21 mmol/L (ref 0–100)

## 2011-07-03 LAB — COMPREHENSIVE METABOLIC PANEL
AST: 29 U/L (ref 0–37)
Albumin: 3.5 g/dL (ref 3.5–5.2)
Alkaline Phosphatase: 201 U/L — ABNORMAL HIGH (ref 39–117)
BUN: 54 mg/dL — ABNORMAL HIGH (ref 6–23)
Potassium: 3.3 mEq/L — ABNORMAL LOW (ref 3.5–5.1)
Sodium: 138 mEq/L (ref 135–145)
Total Protein: 6.7 g/dL (ref 6.0–8.3)

## 2011-07-03 MED ORDER — SODIUM CHLORIDE 0.9 % IV SOLN
INTRAVENOUS | Status: DC
  Administered 2011-07-04: via INTRAVENOUS

## 2011-07-03 NOTE — ED Notes (Signed)
Pt presents to department for evaluation of lower back pain. Ongoing x2 months. Was recently admitted to hospital for hypercalcemia and renal failure. Pt went to PCP this morning and was told to come back to ED for further evaluation. Pt conscious alert and oriented x4. Walker to triage.

## 2011-07-03 NOTE — Patient Instructions (Signed)
Okay to stop HCTZ given may worsen calcium.

## 2011-07-03 NOTE — ED Provider Notes (Signed)
History     CSN: [id number] Arrival date & time: 07/03/2011  5:25 PM   First MD Initiated Contact with Patient 07/03/11 2331      No chief complaint on file. Chief complaint: Low platelets  (Consider location/radiation/quality/duration/timing/severity/associated sxs/prior treatment) HPI This is a 64 year old white male who was recently admitted for hypercalcemia and renal failure. He was discharged 4 days ago. He was seen in his PCPs office today for followup of back pain which he has had for several weeks. Lab work done in the office showed critically low platelets and he was told to come to the ED. His platelet count here is noted to be 20. He states he feels weak, had blood in his stool yesterday and has been having black stools, and has had nosebleeds earlier this week. He denies chest pain, dyspnea or abdominal pain. He does have a decreased appetite and decreased fluid intake. There are no known mitigating or exacerbating factors.  Past Medical History  Diagnosis Date  . HTN (hypertension)   . Allergic rhinitis   . Prostate cancer 2008    s/p prostatectomy  . Hyperlipemia     Past Surgical History  Procedure Date  . Kidney stone surgery   . Kidney stone surgery 06/2009    Stone removed from bladder  . Dexa 05/2011    spine 0.7, L femur 0.0, R femur -0.6, normal  . Prostatectomy 04/06/07    Family History  Problem Relation Age of Onset  . Pneumonia Mother   . Stroke Father   . Cancer Brother     lung  . Cancer Sister     bladder    History  Substance Use Topics  . Smoking status: Former Smoker    Quit date: 07/27/1996  . Smokeless tobacco: Not on file  . Alcohol Use: Yes     weekly      Review of Systems  All other systems reviewed and are negative.    Allergies  Celebrex; Meloxicam; Percocet; and Vicodin  Home Medications   Current Outpatient Rx  Name Route Sig Dispense Refill  . ONE-DAILY MULTI VITAMINS PO TABS Oral Take 1 tablet by mouth  daily.      Marland Kitchen SIMVASTATIN 40 MG PO TABS Oral Take 20 mg by mouth at bedtime.        BP 152/70  Pulse 88  Temp(Src) 98.5 F (36.9 C) (Oral)  Resp 18  SpO2 98%  Physical Exam General: Well-developed, well-nourished male in no acute distress; appearance consistent with age of record HENT: normocephalic, atraumatic; purpura of palate and under side of tongue Eyes: pupils equal round and reactive to light; extraocular muscles intact; pale conjunctivae; slight scleral icterus Neck: supple Heart: regular rate and rhythm Lungs: clear to auscultation bilaterally Abdomen: soft; nontender; nondistended Extremities: No deformity; full range of motion; pulses normal Neurologic: Awake, alert and oriented; motor function intact in all extremities and symmetric; no facial droop Skin: Warm and dry Psychiatric: Normal mood and affect    ED Course  Procedures (including critical care time)    MDM   Nursing notes and vitals signs, including pulse oximetry, reviewed.  Summary of this visit's results, reviewed by myself:  Labs:  Results for orders placed during the hospital encounter of 07/03/11  COMPREHENSIVE METABOLIC PANEL      Component Value Range   Sodium 138  135 - 145 (mEq/L)   Potassium 3.3 (*) 3.5 - 5.1 (mEq/L)   Chloride 101  96 - 112 (mEq/L)  CO2 22  19 - 32 (mEq/L)   Glucose, Bld 114 (*) 70 - 99 (mg/dL)   BUN 54 (*) 6 - 23 (mg/dL)   Creatinine, Ser 1.61 (*) 0.50 - 1.35 (mg/dL)   Calcium 09.6 (*) 8.4 - 10.5 (mg/dL)   Total Protein 6.7  6.0 - 8.3 (g/dL)   Albumin 3.5  3.5 - 5.2 (g/dL)   AST 29  0 - 37 (U/L)   ALT 16  0 - 53 (U/L)   Alkaline Phosphatase 201 (*) 39 - 117 (U/L)   Total Bilirubin 0.9  0.3 - 1.2 (mg/dL)   GFR calc non Af Amer 14 (*) >90 (mL/min)   GFR calc Af Amer 16 (*) >90 (mL/min)  CBC      Component Value Range   WBC 19.2 (*) 4.0 - 10.5 (K/uL)   RBC 2.09 (*) 4.22 - 5.81 (MIL/uL)   Hemoglobin 6.7 (*) 13.0 - 17.0 (g/dL)   HCT 04.5 (*) 40.9 - 52.0 (%)     MCV 87.6  78.0 - 100.0 (fL)   MCH 32.1  26.0 - 34.0 (pg)   MCHC 36.6 (*) 30.0 - 36.0 (g/dL)   RDW 81.1  91.4 - 78.2 (%)   Platelets 20 (*) 150 - 400 (K/uL)  PROTIME-INR      Component Value Range   Prothrombin Time 16.7 (*) 11.6 - 15.2 (seconds)   INR 1.33  0.00 - 1.49   POCT I-STAT, CHEM 8      Component Value Range   Sodium 139  135 - 145 (mEq/L)   Potassium 3.2 (*) 3.5 - 5.1 (mEq/L)   Chloride 107  96 - 112 (mEq/L)   BUN 58 (*) 6 - 23 (mg/dL)   Creatinine, Ser 9.56 (*) 0.50 - 1.35 (mg/dL)   Glucose, Bld 213 (*) 70 - 99 (mg/dL)   Calcium, Ion 0.86 (*) 1.12 - 1.32 (mmol/L)   TCO2 21  0 - 100 (mmol/L)   Hemoglobin 6.1 (*) 13.0 - 17.0 (g/dL)   HCT 57.8 (*) 46.9 - 52.0 (%)   Comment NOTIFIED PHYSICIAN    TYPE AND SCREEN      Component Value Range   ABO/RH(D) O POS     Antibody Screen NEG     Sample Expiration 07/06/2011     3:02 AM Hospitalist to admit.        Hanley Seamen, MD 07/04/11 [phone]

## 2011-07-03 NOTE — Progress Notes (Signed)
Subjective:    Patient ID: [name], male    DOB: 05-27-1946, 65 y.o.   MRN: [id number]  HPI  65 y.o. male with a history of prostate cancer diagnosed in 2008 and is S/P Prostatectomy who was sent to the Emergency Deartment  Last week after appt for follow up compression fracture and pain control due to Abnormal labs which included an elevated BUN and Creatinine but also an elevated Calcium level. Patient has had severe back pain for the past month and was found to have a compression fractures and  to have a kyphoplasty performed by Interventional Radiology. As part of that workup he had labs performed and was to have an MRI, the labs returned as mentioned above. Repeat of the laboratory studies in the Emergency Department revealed a Calcium level of 14.5, and a BUN = 60 and a Creatinine of 5.10.  Over the past month he reports having severe indigestion, and poor appetite and unintentional weight loss of 25 pounds. He has also had the back pain. His significant other reports that he has also had increased confusion as well. He denies having fevers, or chills, or night sweats or cough or chest pain.  In hospital multiple lab abnormalities found..See below. CXR NAD  Poor appetite, 25 lb weight loss in past few months. Nausea. Drinking liquids but eating minimally. Since last seen having confusion, intermittantly in last week..per girlfriend this is still ongoing (pt denies)  Thrombocytopenia: 15 last check 12/3.Marland Kitchen Nosebleeds since home but none in last day. ASA stopped. Pt does report has had dark stool in last 48 hours... Stool heme positive today in office.  In hospital nothing on the smear to suggest DIC, TTP, HUS. Likely due to infiltrative malignancy into the bone marrow (nucleated RBC suggest that)  No evidence to suggest DIC to HUS (mild LDH elevation, no schistocytes)  Hospitalist felt might need a bone marrow biopsy at some point.  Anemia: Hg 7.8 on 12/3.Marland Kitchen Down from baseline of  9.7  1 week ago.  ARF: Cr on 12/3 4.48 FENA: 3.38... Renal source Renal US: IMPRESSION: No obstruction. Slight increased echogenicity of the renal parenchyma consistent with renal medical disease. Bilateral renal stones. Note is made of prominent sludge in the gallbladder.    High calcium: 12/3  12.2   Back pain from lumbar compression fracture in setting of past prostate cancer (s/p prostatectomy):  Recent PSA trending up slightly .Marland Kitchen Last check 12/1 .psa 0.22 Concerning for pathologic fracture from cancer source.   Had planned MRi but unable to do so given renal status.     Review of Systems  Constitutional: Positive for fatigue and unexpected weight change. Negative for fever.  HENT: Negative for ear pain.   Eyes: Negative for pain.  Respiratory: Positive for shortness of breath. Negative for wheezing.   Cardiovascular: Negative for chest pain, palpitations and leg swelling.  Gastrointestinal: Positive for nausea, diarrhea and blood in stool. Negative for vomiting, abdominal pain, abdominal distention and rectal pain.  Genitourinary: Negative for hematuria and flank pain.  Musculoskeletal: Positive for back pain and gait problem.       Using walker  Neurological: Positive for weakness. Negative for syncope and light-headedness.       Objective:   Physical Exam  Constitutional: Vital signs are normal.  Non-toxic appearance. He has a sickly appearance. He appears ill.       Ill appearing     HENT:  Head: Normocephalic.  Eyes: Conjunctivae and EOM are normal.  Pupils are equal, round, and reactive to light.  Neck: Normal range of motion. Neck supple. No thyromegaly present.  Cardiovascular: Normal rate, regular rhythm, normal heart sounds and intact distal pulses.  Exam reveals no gallop and no friction rub.   No murmur heard. Pulmonary/Chest: Effort normal and breath sounds normal. No respiratory distress. He has no wheezes. He has no rales. He exhibits no tenderness.    Abdominal: Soft. Bowel sounds are normal. He exhibits no distension and no mass. There is no tenderness. There is no rebound and no guarding.  Lymphadenopathy:    He has no cervical adenopathy.  Neurological: He is alert. He has normal strength. He is disoriented.       Oriented to self and place not time and date  Skin: Skin is warm and dry.       Skin yellowish in appearance  multiple areas of bruising on arms and abdomen  Psychiatric: He has a normal mood and affect. His speech is normal and behavior is normal. Thought content is paranoid. He exhibits abnormal recent memory.       Confused about day of week, medicaitons          Assessment & Plan:  Very pleasant 65 year old male with history of prostate cancer s/p prostatectomy found recently to have ARF, thrombocytopenia, hypercalcemia, anemia as well as possible pathologic compression  fracture... Worrisome for malignant disease affecting bone marrow.  I recommended return to hospital today given development of nosebleeds and new blood in stool in setting of platelets of 15, Hg 7.8 at last check 12/3. Attempted direct admission to [facility].. Given lack of beds and back up of pts. It was recommended to send him to ER for immediate lab eval, and stabilization.  Pt agreeable and understands importance of evaluation and stabalization.   Expect reeval of creatinine, Hg, platelets, ca.. Likely platelet transfusion. Likely oncology, nephrology consult.  Discussed pt case with Dr. Laverle Patter, his longtime urologist... PSA only slightly increase since 02/2011.. Prior to that 0. Given only very low PSA... Issues may be from other source or possibly very poorly differentiated prosate cancer.  He will plan on seeing pt in hospital on Monday.

## 2011-07-03 NOTE — ED Notes (Signed)
Patient states was just released from the hosp on Monday with low platelets as well as elavated calcium. States today saw pcp and was sent to ER due to no bed available for direct admit. Patient denies pain but reports feeling tired

## 2011-07-04 ENCOUNTER — Encounter (HOSPITAL_COMMUNITY): Payer: Self-pay | Admitting: Internal Medicine

## 2011-07-04 ENCOUNTER — Inpatient Hospital Stay (HOSPITAL_COMMUNITY): Payer: BC Managed Care – PPO

## 2011-07-04 DIAGNOSIS — R634 Abnormal weight loss: Secondary | ICD-10-CM

## 2011-07-04 DIAGNOSIS — T148XXA Other injury of unspecified body region, initial encounter: Secondary | ICD-10-CM

## 2011-07-04 DIAGNOSIS — R040 Epistaxis: Secondary | ICD-10-CM | POA: Diagnosis present

## 2011-07-04 DIAGNOSIS — M549 Dorsalgia, unspecified: Secondary | ICD-10-CM

## 2011-07-04 DIAGNOSIS — D62 Acute posthemorrhagic anemia: Secondary | ICD-10-CM | POA: Diagnosis present

## 2011-07-04 LAB — HEMOGLOBIN AND HEMATOCRIT, BLOOD: Hemoglobin: 7.6 g/dL — ABNORMAL LOW (ref 13.0–17.0)

## 2011-07-04 LAB — DIC (DISSEMINATED INTRAVASCULAR COAGULATION)PANEL
D-Dimer, Quant: 2.81 ug/mL-FEU — ABNORMAL HIGH (ref 0.00–0.48)
Fibrinogen: 446 mg/dL (ref 204–475)
Prothrombin Time: 16.5 seconds — ABNORMAL HIGH (ref 11.6–15.2)

## 2011-07-04 LAB — HEPATIC FUNCTION PANEL
AST: 27 U/L (ref 0–37)
Albumin: 3.1 g/dL — ABNORMAL LOW (ref 3.5–5.2)
Total Bilirubin: 1 mg/dL (ref 0.3–1.2)

## 2011-07-04 LAB — RETICULOCYTES
RBC.: 2.48 MIL/uL — ABNORMAL LOW (ref 4.22–5.81)
Retic Count, Absolute: 34.7 10*3/uL (ref 19.0–186.0)

## 2011-07-04 LAB — DIRECT ANTIGLOBULIN TEST (NOT AT ARMC): DAT, complement: NEGATIVE

## 2011-07-04 MED ORDER — SODIUM CHLORIDE 0.9 % IV SOLN
INTRAVENOUS | Status: DC
  Administered 2011-07-04 – 2011-07-08 (×8): via INTRAVENOUS

## 2011-07-04 MED ORDER — ALUM & MAG HYDROXIDE-SIMETH 200-200-20 MG/5ML PO SUSP
30.0000 mL | Freq: Four times a day (QID) | ORAL | Status: DC | PRN
  Administered 2011-07-05: 30 mL via ORAL
  Filled 2011-07-04 (×2): qty 30

## 2011-07-04 MED ORDER — PHYTONADIONE 5 MG PO TABS
5.0000 mg | ORAL_TABLET | Freq: Every day | ORAL | Status: AC
  Administered 2011-07-04 – 2011-07-06 (×3): 5 mg via ORAL
  Filled 2011-07-04 (×4): qty 1

## 2011-07-04 MED ORDER — ZOLPIDEM TARTRATE 5 MG PO TABS
5.0000 mg | ORAL_TABLET | Freq: Every evening | ORAL | Status: DC | PRN
  Administered 2011-07-13: 5 mg via ORAL
  Filled 2011-07-04 (×2): qty 1

## 2011-07-04 MED ORDER — ONDANSETRON HCL 4 MG PO TABS: 4.0000 mg | ORAL_TABLET | Freq: Four times a day (QID) | ORAL | Status: DC | PRN

## 2011-07-04 MED ORDER — PANTOPRAZOLE SODIUM 40 MG IV SOLR
40.0000 mg | INTRAVENOUS | Status: DC
  Administered 2011-07-06: 40 mg via INTRAVENOUS
  Filled 2011-07-04 (×3): qty 40

## 2011-07-04 MED ORDER — ONDANSETRON HCL 4 MG/2ML IJ SOLN
4.0000 mg | Freq: Four times a day (QID) | INTRAMUSCULAR | Status: DC | PRN
  Administered 2011-07-10: 4 mg via INTRAVENOUS
  Filled 2011-07-04: qty 2

## 2011-07-04 MED ORDER — WHITE PETROLATUM GEL
Status: AC
  Filled 2011-07-04: qty 5

## 2011-07-04 MED ORDER — ACETAMINOPHEN 650 MG RE SUPP: 650.0000 mg | Freq: Four times a day (QID) | RECTAL | Status: DC | PRN

## 2011-07-04 MED ORDER — BIOTENE DRY MOUTH MT LIQD
15.0000 mL | Freq: Two times a day (BID) | OROMUCOSAL | Status: DC
  Administered 2011-07-04 – 2011-07-13 (×16): 15 mL via OROMUCOSAL

## 2011-07-04 MED ORDER — ACETAMINOPHEN 325 MG PO TABS: 650.0000 mg | ORAL_TABLET | Freq: Four times a day (QID) | ORAL | Status: DC | PRN

## 2011-07-04 NOTE — H&P (Signed)
DATE OF ADMISSION:  07/04/2011  PCP:    [name], MD, MD   Chief Complaint: Nosebleeds, black stools and weakness   HPI: [name] is an 65 y.o. male.who presents to Ed due to recurring nosebleeds, black stools , and increased weakness over the past 3 days since discharge from the hospiial on 12/03.  Marland Kitchen  He was admitted at that time for Hypercalcemia and ARF, and was also found to have anemia, and on his ED evaluation this evening he was found to have a hemoglobin level of 6.7, and was referred for medical admission.  He was seen by his PCP on 12/07 for follow up post hospitalization, and was sent to the ED due to his new symptoms.    Past Medical History  Diagnosis Date  . HTN (hypertension)   . Allergic rhinitis   . Prostate cancer 2008    s/p prostatectomy  . Hyperlipemia   . Hypercalcemia of malignancy     Past Surgical History  Procedure Date  . Kidney stone surgery   . Kidney stone surgery 06/2009    Stone removed from bladder  . Dexa 05/2011    spine 0.7, L femur 0.0, R femur -0.6, normal  . Prostatectomy 04/06/07    Medications:  HOME MEDS: Prior to Admission medications   Medication Sig Start Date End Date Taking? Authorizing Provider  Multiple Vitamin (MULTIVITAMIN) tablet Take 1 tablet by mouth daily.     Yes Historical Provider, MD  simvastatin (ZOCOR) 40 MG tablet Take 20 mg by mouth at bedtime.     Yes Historical Provider, MD    Allergies:  Allergies  Allergen Reactions  . Celebrex (Celecoxib) Nausea Only  . Meloxicam Itching  . Percocet (Oxycodone-Acetaminophen) Itching  . Vicodin (Hydrocodone-Acetaminophen) Itching    Social History:   reports that he quit smoking about 14 years ago. He does not have any smokeless tobacco history on file. He reports that he drinks alcohol. He reports that he does not use illicit drugs.  Family History: Family History  Problem Relation Age of Onset  . Pneumonia Mother   . Stroke Father   . Cancer Brother      lung  . Cancer Sister     bladder    Review of Systems:  The patient denies anorexia, fever, vision loss, decreased hearing, hoarseness, chest pain, syncope, peripheral edema, balance deficits, abdominal pain, severe indigestion/heartburn, hematuria, incontinence, genital sores, muscle weakness, suspicious skin lesions, transient blindness, difficulty walking, depression, unusual weight change, enlarged lymph nodes, angioedema.   Physical Exam:  GEN:  Pleasant 65 year old well nourished and well developed Caucasian male examined  and in no acute distress; cooperative with exam Filed Vitals:   07/04/11 0515 07/04/11 0613 07/04/11 0645 07/04/11 0700  BP: 164/73 148/67 156/74 155/72  Pulse: 89 83 84 84  Temp: 98.1 F (36.7 C) 98.4 F (36.9 C) 97.9 F (36.6 C) 98.4 F (36.9 C)  TempSrc: Oral Oral Oral Oral  Resp: 20 18 20 20   Weight:      SpO2:       Blood pressure 155/72, pulse 84, temperature 98.4 F (36.9 C), temperature source Oral, resp. rate 20, weight 96 kg (211 lb 10.3 oz), SpO2 97.00%. PSYCH: He is alert and oriented x4; does not appear anxious does not appear depressed; affect is normal HEENT: Normocephalic and Atraumatic, Mucous membranes pink; PERRLA; EOM intact; Fundi:  Benign;  No scleral icterus, Nares: Patent, Oropharynx: Clear,  Fair Dentition, Neck:  FROM, no cervical lymphadenopathy nor thyromegaly or carotid bruit; no JVD; Breasts:: Not examined CHEST WALL: No tenderness CHEST: Normal respiration, clear to auscultation bilaterally HEART: Regular rate and rhythm; no murmurs rubs or gallops BACK: No kyphosis or scoliosis; no CVA tenderness ABDOMEN: Positive Bowel Sounds, soft non-tender; no masses, no organomegaly. Rectal Exam: Not done EXTREMITIES: No bone or joint deformity; age-appropriate arthropathy of the hands and knees; no cyanosis, clubbing or edema; no ulcerations. Genitalia: not examined PULSES: 2+ and symmetric SKIN: Normal hydration no rash or  ulceration CNS: Cranial nerves 2-12 grossly intact no focal neurologic deficit   Labs & Imaging Results for orders placed during the hospital encounter of 07/03/11 (from the past 48 hour(s))  COMPREHENSIVE METABOLIC PANEL     Status: Abnormal   Collection Time   07/03/11  9:49 PM      Component Value Range Comment   Sodium 138  135 - 145 (mEq/L)    Potassium 3.3 (*) 3.5 - 5.1 (mEq/L)    Chloride 101  96 - 112 (mEq/L)    CO2 22  19 - 32 (mEq/L)    Glucose, Bld 114 (*) 70 - 99 (mg/dL)    BUN 54 (*) 6 - 23 (mg/dL)    Creatinine, Ser 0.45 (*) 0.50 - 1.35 (mg/dL)    Calcium 40.9 (*) 8.4 - 10.5 (mg/dL)    Total Protein 6.7  6.0 - 8.3 (g/dL)    Albumin 3.5  3.5 - 5.2 (g/dL)    AST 29  0 - 37 (U/L)    ALT 16  0 - 53 (U/L)    Alkaline Phosphatase 201 (*) 39 - 117 (U/L)    Total Bilirubin 0.9  0.3 - 1.2 (mg/dL)    GFR calc non Af Amer 14 (*) >90 (mL/min)    GFR calc Af Amer 16 (*) >90 (mL/min)   CBC     Status: Abnormal   Collection Time   07/03/11  9:49 PM      Component Value Range Comment   WBC 19.2 (*) 4.0 - 10.5 (K/uL)    RBC 2.09 (*) 4.22 - 5.81 (MIL/uL)    Hemoglobin 6.7 (*) 13.0 - 17.0 (g/dL)    HCT 81.1 (*) 91.4 - 52.0 (%)    MCV 87.6  78.0 - 100.0 (fL)    MCH 32.1  26.0 - 34.0 (pg)    MCHC 36.6 (*) 30.0 - 36.0 (g/dL)    RDW 78.2  95.6 - 21.3 (%)    Platelets 20 (*) 150 - 400 (K/uL)   PROTIME-INR     Status: Abnormal   Collection Time   07/03/11  9:49 PM      Component Value Range Comment   Prothrombin Time 16.7 (*) 11.6 - 15.2 (seconds)    INR 1.33  0.00 - 1.49    POCT I-STAT, CHEM 8     Status: Abnormal   Collection Time   07/03/11 10:10 PM      Component Value Range Comment   Sodium 139  135 - 145 (mEq/L)    Potassium 3.2 (*) 3.5 - 5.1 (mEq/L)    Chloride 107  96 - 112 (mEq/L)    BUN 58 (*) 6 - 23 (mg/dL)    Creatinine, Ser 0.86 (*) 0.50 - 1.35 (mg/dL)    Glucose, Bld 578 (*) 70 - 99 (mg/dL)    Calcium, Ion 4.69 (*) 1.12 - 1.32 (mmol/L)    TCO2 21  0 - 100  (mmol/L)    Hemoglobin  6.1 (*) 13.0 - 17.0 (g/dL)    HCT 16.1 (*) 09.6 - 52.0 (%)    Comment NOTIFIED PHYSICIAN     TYPE AND SCREEN     Status: Normal (Preliminary result)   Collection Time   07/03/11 10:35 PM      Component Value Range Comment   ABO/RH(D) O POS      Antibody Screen NEG      Sample Expiration 07/06/2011      Unit Number [id number]      Blood Component Type RED CELLS,LR      Unit division 00      Status of Unit ISSUED      Transfusion Status OK TO TRANSFUSE      Crossmatch Result Compatible      Unit Number [id number]      Blood Component Type RED CELLS,LR      Unit division 00      Status of Unit ISSUED      Transfusion Status OK TO TRANSFUSE      Crossmatch Result Compatible      Unit Number [id number]      Blood Component Type RED CELLS,LR      Unit division 00      Status of Unit ALLOCATED      Transfusion Status OK TO TRANSFUSE      Crossmatch Result Compatible      Unit Number [id number]      Blood Component Type RBC LR PHER1      Unit division 00      Status of Unit ALLOCATED      Transfusion Status OK TO TRANSFUSE      Crossmatch Result Compatible      Unit Number [id number]      Blood Component Type RBC LR PHER1      Unit division 00      Status of Unit ALLOCATED      Transfusion Status OK TO TRANSFUSE      Crossmatch Result Compatible      Unit Number [id number]      Blood Component Type RBC LR PHER1      Unit division 00      Status of Unit ALLOCATED      Transfusion Status OK TO TRANSFUSE      Crossmatch Result Compatible     ABO/RH     Status: Normal   Collection Time   07/03/11 10:35 PM      Component Value Range Comment   ABO/RH(D) O POS     PREPARE RBC (CROSSMATCH)     Status: Normal   Collection Time   07/04/11 12:00 AM      Component Value Range Comment   Order Confirmation ORDER PROCESSED BY BLOOD BANK     PREPARE PLATELET PHERESIS     Status: Normal (Preliminary result)   Collection Time   07/04/11  4:00 AM      Component Value  Range Comment   Unit Number [id number]      Blood Component Type PLTPHER LR2      Unit division 00      Status of Unit ISSUED      Transfusion Status OK TO TRANSFUSE     PREPARE RBC (CROSSMATCH)     Status: Normal   Collection Time   07/04/11  4:00 AM      Component Value Range Comment   Order Confirmation ORDER PROCESSED BY BLOOD BANK      No results found.  Assessment/Plan: Present on Admission:  .Anemia associated with acute blood loss .Epistaxis .Thrombocytopenia   Hypercalcemia   ARF with CKD   Hypokalemia   Compression Ffracture    Back Pain due to Compression Fracture  Plan:    Admission for further evaluation and treatment primarily for his symptomatic anemia.with bleeding , most likely his melena is from his epistaxis, and his epistaxis is from his thrombocytopenia.  Serial Hemoglobin checks have been ordered, and , he will be transfused a total of 4 units of packed red blood cells.  2 unit of platelets will also be transfused.  His transfusions will be given as 2 units now then in 8 hours the next 2 units will be transfused.  IVFs have also been ordered for maintenance therapy. His calcium level is mildly high but not as high as one previous admission.  IVFs should help improve his current calcium level.  His elevated BUN and Creatinine may also be due to the metabolism of blood from his Epistaxis, +/- a GI source which may be possible. Further consultations will be left up to the primary rounding team.  His regular medications will be further reconciled.  SCDs have been ordered for DVT prophylaxis. His electrolytes and BUN/cr will be monitored and corrected as needed.   Other plans as per orders.    CODE STATUS:      FULL CODE       Evertt Chouinard C 07/04/2011, 8:04 AM

## 2011-07-04 NOTE — Progress Notes (Signed)
Subjective: Awake following command . No more nose bleed. No melena today. Had nose bleed and melena 2 days prior to admission. Denies chest pain.  Objective: Filed Vitals:   07/04/11 0645 07/04/11 0700 07/04/11 0800 07/04/11 0845  BP: 156/74 155/72 153/69 170/76  Pulse: 84 84 86 84  Temp: 97.9 F (36.6 C) 98.4 F (36.9 C) 97.8 F (36.6 C) 98.1 F (36.7 C)  TempSrc: Oral Oral Oral Oral  Resp: 20 20 18 18   Weight:      SpO2:       Weight change:   Intake/Output Summary (Last 24 hours) at 07/04/11 0913 Last data filed at 07/04/11 0645  Gross per 24 hour  Intake   12.5 ml  Output      0 ml  Net   12.5 ml    General: Alert, awake, oriented x3, in no acute distress.  HEENT: No bruits, no goiter.  Heart: Regular rate and rhythm, without murmurs, rubs, gallops.  Lungs: Crackles left side, bilateral air movement.  Abdomen: Soft, nontender, nondistended, positive bowel sounds.  Neuro: Grossly intact, nonfocal.   Lab Results:  [facility] 07/03/11 2210 07/03/11 2149  NA 139 138  K 3.2* 3.3*  CL 107 101  CO2 -- 22  GLUCOSE 111* 114*  BUN 58* 54*  CREATININE 4.60* 4.12*  CALCIUM -- 11.5*  MG -- --  PHOS -- --    Basename 07/03/11 2149  AST 29  ALT 16  ALKPHOS 201*  BILITOT 0.9  PROT 6.7  ALBUMIN 3.5    Basename 07/03/11 2210 07/03/11 2149  WBC -- 19.2*  NEUTROABS -- --  HGB 6.1* 6.7*  HCT 18.0* 18.3*  MCV -- 87.6  PLT -- 20*    Micro Results: Recent Results (from the past 240 hour(s))  MRSA PCR SCREENING     Status: Normal   Collection Time   06/27/11  6:25 PM      Component Value Range Status Comment   MRSA by PCR NEGATIVE  NEGATIVE  Final     Studies/Results: No results found.  Medications: I have reviewed the patient's current medications.   Patient Active Hospital Problem List:  Anemia associated with acute blood loss (07/04/2011):` Likely secondary to epistaxis, melena in setting thrombocytopenia, plus bone marrow suppression.  Patient  received 2 units PRBC. HB after transfusion pending. Will continue to monitor HB trend. Will consider GI consult. Hematology consulted.   Thrombocytopenia (06/27/2011)/Anemia" Concern for BM involvement. Bilirubin NL 12-07. Less likely hemolysis, TTP. ITP possible, also differential multiple myeloma.  Workup during prior admission is as follow: PSA 0.22, reticulocyte count 0.8, haptoglobin: 157, D. Dimer 2.4, fibrinogen 414, INR 1.16, PT 15, PTT 52, no Schistocyte.   Epistaxis (07/04/2011)  Renal failure: Unclear etiology, I don't found a note from renal from last admission. [place] negative for hydronephrosis.  I will get UPE, SPEP. Check UA. I will order Bone survey. Will consult renal. Strict I&O .   Leukocytosis: WBC was increase during last admission. Probably related to hemaology process.                            Will check chest x ray. Monitor for fever.   LOS: 1 day   Rocky Gladden M.D.  [facility] 07/04/2011, 9:13 AM

## 2011-07-04 NOTE — Progress Notes (Signed)
This hospital consult note has been dictated.  #[id number]   Malignancy, especially light chain multiple myeloma, would be a unifying diagnosis in this patient who has been fairly healthy until recently.  He needs to have a bone marrow with flow studies, cytogenetics and FISH studies ASAP.  To have complete serum and urine protein studies.  Will order bone scan.  May need CT scans of abdomen and pelvis to look for carcinoma if above studies negative.  Continue to transfuse with packed RBCs and platelets as indicated.  Will give vitamin K to see if coags improve.  Renal consultation pending.  Thanks for this interesting consult.  Will follow.  I can be reached on pager [phone].   Samul Dada

## 2011-07-04 NOTE — Consult Note (Signed)
NAME:  NARADA, UZZLE NO.:  [id number]  MEDICAL RECORD NO.:  [id number]  LOCATION:  6731                         FACILITY:  [facility]  PHYSICIAN:  Samul Dada, M.D.DATE OF BIRTH:  17-Jul-1946  DATE OF CONSULTATION:  07/04/2011 DATE OF DISCHARGE:                                CONSULTATION   HISTORY:  [name] is a 65 year old white male whom I am asked to see in consultation by  Dr. Sunnie Nielsen, from [facility] for evaluation of marked thrombocytopenia and anemia in association with an abnormal peripheral blood smear.  [name] appears to have been in fairly good health up until about 4 weeks ago.  Part of this history was obtained from his significant other and health power-of-attorney, [name].  The patient was admitted to the hospital from June 26, 2011, through June 29, 2011, because of abnormal renal function and hyperkalemia that was found prior to a planned kyphoplasty.  In fact, his calcium was 14.5, BUN 60, creatinine 5.10 at the time of admission. There was also a history of weight loss approximately 30 pounds over the past 6 months.  At one point, the patient weighed 280 pounds and currently his weight is running about 210 pounds.  During that admission, the patient's hemoglobin was 7.6, hematocrit 21.6, and platelets were 15,000 at the time of discharge.  At the time of admission on June 26, 2011, his hemoglobin was 9.7, hematocrit 27.0, and platelets were 22,000.  By contrast if we go back to May 07, 2009, hemoglobin was 17.1, hematocrit 49.4, and platelets were 243,000. His white count was 8.2 at that time.  The patient apparently insisted on being discharged from the hospital even though his workup was incomplete.  He did have renal ultrasound that showed essentially normal size kidneys without evidence of obstruction.  I believe the plan was to continue his workup as an outpatient.  At the time of discharge,  his calcium had come down to 12.2, BUN was 43, creatinine 4.48.  An LDH on June 27, 2011, was 416.  The patient required readmission to the hospital.  Yesterday, July 03, 2011, when he presented to the emergency room with severe epistaxis that had been ongoing for a couple of days prior to admission.  The patient was also having melanotic stools.  He was extremely weak.  His current history is punctuated by weight loss, some confusion, difficulty using his cell phone, and the remote of the TV, foamy urine, bruising and bleeding.  In addition to the symptoms which started approximately a month ago, the patient started having back pain.  At that time, there was found to have a mild compression fracture of L3 along with degenerative disease.  Interestingly, his bone density scan was normal without osteopenia or osteoporosis, and there were no other significant findings on the x-ray.  He has had chest x-ray on June 28, 2011, that was unremarkable.  The plans I believe were for further investigation specifically an MRI and kyphoplasty when it was found that his calcium and renal function were markedly abnormal.  CURRENT LABORATORY DATA:  White count 19.2, hemoglobin 6.7, hematocrit 18.3, and platelets 20,000.  BUN  54, creatinine 4.12, calcium of 11.5. Alkaline phosphatase is 201.  Total protein is 6.7, albumin 3.5, and immunoglobulins are 3.2, essentially unremarkable.  As stated, we are asked to evaluate this patient from the standpoint of his abnormal hematologic picture,  PAST MEDICAL HISTORY:  Notable for history of prostate cancer dating back to 2008.  The patient underwent robotic assisted laparoscopic radical prostatectomy by Dr. [name] and was found to have pathologic T3a prostate cancer involving both lobes with capsular extension and positive margin focally.  I do not believe the patient received any postoperative radiation and has been followed with PSA. 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 Terrace Arabia.  He has undergone cystoscopy to remove stone from his bladder.  ALLERGIES:  No true allergies to any medicine.  According to the patient's chart, he has nausea from CELEBREX, pruritus from MELOXICAM, PERCOCET and VICODIN.  MEDICINES PRIOR TO ADMISSION:  Multivitamins and Zocor 20 mg at bedtime. The patient was also on hydrochlorothiazide at a time when his calcium was high, and this was recently discontinued.  FAMILY HISTORY:  Positive for lung cancer in a brother, bladder cancer in a sister.  He has a son and daughter who live in [place] and 2 grandchildren who are healthy.  SOCIAL HISTORY:  The patient stopped smoking cigarettes 14 years ago. No other use in tobacco products.  He has not had any alcohol in several months.  No use of drugs.  The patient was born in [place] state and currently lives in [place] in a townhouse.  He is not being able to drive in the past few weeks.  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  REVIEW OF SYSTEMS:  The patient has not felt well for the last several weeks, poor energy.  He has been fairly inactive, sleeping a lot.  No history of stroke.  Apparently, his balance has been poor, and he has had a couple of falls.  Vision and hearing are good.  He wears glasses. Appetite has been poor.  There has been significant degree of weight loss about 30 pounds in the last 6 months.  He has had some constipation secondary to pain medicines, the black stools as described above.  No cardiac or pulmonary problems.  He denies any urinary difficulties, swelling of the legs, blood clots, intermittent claudication.  There has been easy bruising and bleeding for the last several weeks.  No fever or night sweats.  Skin is dry.  He has some hay fever symptoms.  No  history of depression.  PHYSICAL EXAMINATION:  VITAL SIGNS:  The patient is a large gentleman weighing approximately 210 pounds,  height 63 inches.  He is afebrile. There has been no history of any fever.  Pulse 84 and regular, respirations regular and unlabored.  Blood pressure 170/76. HEENT:  He wears glasses.  No scleral icterus.  Pupillary and extraocular movements are normal.  Mouth and pharynx palate is dry. There are just a few purpuric lesions.  There were several missing lower teeth. NECK:  Without adenopathy, thyroid enlargement or bruit. HEART:  Normal. LUNGS:  Normal. BACK:  Tenderness in the low back region around L3-L5 with some tenderness. ABDOMEN:  Generally soft, nontender with no organomegaly or masses palpable.  No axillary or inguinal adenopathy. EXTREMITIES:  No peripheral edema, clubbing, palmar erythema, petechiae or purpura.  SKIN:  Generally dry without any significant lesions. NEUROLOGIC:  Grossly normal.  LABORATORY DATA:  As stated above, CBC from July 03, 2011 at 2149 hours white count 19.2, hemoglobin 6.7, hematocrit 18.3, platelets 20,000.  We do not have a formal differential, however, inspection of the peripheral smear did not show schistocytes.  There was marked myeloid and erythroid precursors particularly erythroid precursors that looked perhaps somewhat megaloblastic and also dysplastic.  Platelets were small to normal size certainly not large.  There was an early forms perhaps 1 or 2 blast.  Chemistries notable for sodium 138, potassium 3.3, BUN 54, creatinine 4.12, calcium 11.5.  Alkaline phosphatase 201, albumin 3.5, total protein 6.7, bilirubin 0.9, LDH was 641.  Fibrinogen 446.  Protime 16.5 with an INR of 1.31, PTT 51.  D-dimer 281 essentially stable as compared with similar studies on June 27, 2011.  PSA on June 27, 2011, was 0.22 as compared with 0.19 on June 08, 2011. Urine shows significant protein 100 mg/dL.  Chest x-ray  today shows no active pulmonary disease.  Metastatic bone survey also was reviewed with the radiologist shows no suspicious lytic lesions and no significant osteopenia.  There is mild superior endplate compression deformities of T12 and L3.  IMPRESSION AND PLAN:  At the present time, [name] is a diagnostic mystery although certainly the possibility of malignancy is highly suspect on the basis of his hypercalcemia, elevated LDH, and the abnormal CBC and peripheral blood findings, which would suggest a myelophthisic process.  Problems enumerated as follows, anemia, thrombocytopenia and abnormal peripheral smear certainly raise the concern about multiple myeloma or other hematologic malignancy such as lymphoma, leukemia or severe myelodysplastic syndrome.  Clearly, the patient is going to need a bone marrow aspirate biopsy with flow study, cytogenetics and FISH studies for myeloma, MDS and leukemia.  Hopefully, we can set this up for Monday, July 06, 2011.  Vitamin B12 level also needs to be checked. 1. Marked renal insufficiency.  Highly suspect here is the possibility     of multiple myeloma  specifically light chain disease since the     patient does not appear to have elevated protein levels in the     peripheral blood.  I believe he is going to be seen by a renal     specialist shortly. 2. Hypercalcemia again malignancy is suspect here.  The patient was on     thiazide, which I do not think adequately explain his     hypercalcemia.  We will order a bone scan and consider MRI of the     lumbar spine in view of the compression fractures at T12 and L11 in     the setting of a normal bone density scan. 3. Back pain and compression fractures, again concern for multiple     myeloma or other malignant process. 4. Markedly elevated LDH raising concern again for malignancy,     however, elevated LDHs can be seen in vitamin B12 deficiency and     hemolysis.  I do not think this  patient has thrombotic     thrombocytopenic purpura.  He does not appear to have a hemolytic     anemia in view of the fact that his retic count is not elevated     with an absolute retic count of 23.2, and haptoglobin is normal at     157. 5. Weight loss again raising concern about malignancy.  We will proceed with the above workup as described, and we will follow  with you.  If the above studies were inconclusive, then this patient may require CT scans of abdomen and pelvis.     Samul Dada, M.D.     DSM/MEDQ  D:  07/04/2011  T:  07/04/2011  Job:  [id number]  cc:   [name], MD [name], MD Maree Krabbe, M.D.

## 2011-07-05 LAB — CBC
MCH: 30.4 pg (ref 26.0–34.0)
MCHC: 36.1 g/dL — ABNORMAL HIGH (ref 30.0–36.0)
Platelets: 35 10*3/uL — ABNORMAL LOW (ref 150–400)
RDW: 14.8 % (ref 11.5–15.5)

## 2011-07-05 LAB — HEMOGLOBIN AND HEMATOCRIT, BLOOD
HCT: 26.9 % — ABNORMAL LOW (ref 39.0–52.0)
Hemoglobin: 9.7 g/dL — ABNORMAL LOW (ref 13.0–17.0)

## 2011-07-05 LAB — RENAL FUNCTION PANEL
Albumin: 3.3 g/dL — ABNORMAL LOW (ref 3.5–5.2)
CO2: 21 mEq/L (ref 19–32)
Chloride: 109 mEq/L (ref 96–112)
Creatinine, Ser: 3.59 mg/dL — ABNORMAL HIGH (ref 0.50–1.35)
GFR calc non Af Amer: 16 mL/min — ABNORMAL LOW (ref >90)
Potassium: 2.7 mEq/L — CL (ref 3.5–5.1)

## 2011-07-05 LAB — DIFFERENTIAL
Basophils Absolute: 1 K/uL — ABNORMAL HIGH (ref 0.0–0.1)
Basophils Relative: 7 % — ABNORMAL HIGH (ref 0–1)
Eosinophils Absolute: 0.3 K/uL (ref 0.0–0.7)
Eosinophils Relative: 2 % (ref 0–5)
Lymphocytes Relative: 31 % (ref 12–46)
Lymphs Abs: 4.5 K/uL — ABNORMAL HIGH (ref 0.7–4.0)
Monocytes Absolute: 4.7 K/uL — ABNORMAL HIGH (ref 0.1–1.0)
Monocytes Relative: 32 % — ABNORMAL HIGH (ref 3–12)
Neutro Abs: 4.1 K/uL (ref 1.7–7.7)
Neutrophils Relative %: 28 % — ABNORMAL LOW (ref 43–77)

## 2011-07-05 LAB — VITAMIN B12: Vitamin B-12: 332 pg/mL (ref 211–911)

## 2011-07-05 LAB — PREPARE PLATELET PHERESIS: Unit division: 0

## 2011-07-05 MED ORDER — POTASSIUM CHLORIDE CRYS ER 20 MEQ PO TBCR
40.0000 meq | EXTENDED_RELEASE_TABLET | Freq: Two times a day (BID) | ORAL | Status: AC
  Administered 2011-07-05 (×2): 40 meq via ORAL
  Filled 2011-07-05 (×2): qty 2

## 2011-07-05 MED ORDER — POTASSIUM CHLORIDE 10 MEQ/100ML IV SOLN
10.0000 meq | INTRAVENOUS | Status: AC
  Administered 2011-07-05 (×4): 10 meq via INTRAVENOUS
  Filled 2011-07-05 (×8): qty 100

## 2011-07-05 MED ORDER — MAGNESIUM SULFATE 50 % IJ SOLN
1.0000 g | Freq: Once | INTRAMUSCULAR | Status: DC
  Filled 2011-07-05: qty 2

## 2011-07-05 MED ORDER — MAGNESIUM SULFATE IN D5W 10-5 MG/ML-% IV SOLN
1.0000 g | Freq: Once | INTRAVENOUS | Status: AC
  Administered 2011-07-05: 1 g via INTRAVENOUS
  Filled 2011-07-05: qty 100

## 2011-07-05 NOTE — Consults (Signed)
[name] 07/05/2011 Javyon Fontan D Requesting Physician:  Dr. Della Goo  Reason for Consult:  New onset renal failure and hypercalcemia HPI: The patient is a 65 y.o. year-old WM. He has a 3 year hx of HTN, had prostate Ca treated in 2008 with prostatectomy, and hx of kidney stones treated within the past year by Dr. Laverle Patter.  He has had problems with back pain, poor appetite and wt loss for about a month.  Recently dx'd with lumbar compression fx and was to have vertebroplasty when lab work revealed hypercalcemia and renal failure.  Was admitted about a week ago, but woulnd't stay to complete the workup and was discharge with creat around 4 and Ca of 12.  Readmitted now with epistaxis, creat 5.10 and Ca 14.5.  Creat down to 4.12 with IVF's on 12/7,  Today's labs pending. Good UOP , over a liter/day   Past Medical History:  Past Medical History  Diagnosis Date  . HTN (hypertension)   . Allergic rhinitis   . Prostate cancer 2008    s/p prostatectomy  . Hyperlipemia   . Hypercalcemia of malignancy     Past Surgical History:  Past Surgical History  Procedure Date  . Kidney stone surgery   . Kidney stone surgery 06/2009    Stone removed from bladder  . Dexa 05/2011    spine 0.7, L femur 0.0, R femur -0.6, normal  . Prostatectomy 04/06/07    Family History:  Family History  Problem Relation Age of Onset  . Pneumonia Mother   . Stroke Father   . Cancer Brother     lung  . Cancer Sister     bladder   Social History:  reports that he quit smoking about 14 years ago. He does not have any smokeless tobacco history on file. He reports that he drinks alcohol. He reports that he does not use illicit drugs.  Allergies:  Allergies  Allergen Reactions  . Celebrex (Celecoxib) Nausea Only  . Meloxicam Itching  . Percocet (Oxycodone-Acetaminophen) Itching  . Vicodin (Hydrocodone-Acetaminophen) Itching    Home medications: Prior to Admission medications   Medication Sig  Start Date End Date Taking? Authorizing Provider  Multiple Vitamin (MULTIVITAMIN) tablet Take 1 tablet by mouth daily.     Yes Historical Provider, MD  simvastatin (ZOCOR) 40 MG tablet Take 20 mg by mouth at bedtime.     Yes Historical Provider, MD    Inpatient medications:    . antiseptic oral rinse  15 mL Mouth Rinse BID  . pantoprazole (PROTONIX) IV  40 mg Intravenous Q24H  . phytonadione  5 mg Oral Daily  . white petrolatum        Review of Systems Gen:  Denies headache, fever, chills, sweats.  No weight loss. HEENT:  No visual change, sore throat, difficulty swallowing. Resp:  No difficulty breathing, DOE.  No cough or hemoptysis. Cardiac:  No chest pain, orthopnea, PND.  Denies edema. GI:   Denies abdominal pain.   No nausea, vomiting, diarrhea.  No constipation. GU:  Denies difficulty or change in voiding.  No change in urine color.     MS:  Denies joint pain or swelling.   Derm:  Denies skin rash or itching.  No chronic skin conditions.  Neuro:   Denies focal weakness, memory problems, hx stroke or TIA.   Psych:  Denies symptoms of depression of anxiety.  No hallucination.    Physical Exam:  Blood pressure 153/75, pulse 84, temperature 97.6 F (  36.4 C), temperature source Oral, resp. rate 19, weight 96 kg (211 lb 10.3 oz), SpO2 95.00%.  Gen: alert, no distress, 0x3 Skin: no rash, no edema Neck: no jvd or bruits Chest: clear bilat Heart: regular without rub, gallop or murmur Abdomen: soft, NT no mass no HSM Ext: good pulses in LE's , no edema Neuro: alert and 0x3, nonfocal Heme/Lymph: no LAN or bruising Psych: calm and alert  Labs: Basic Metabolic Panel:  Lab 07/03/11 5621 07/03/11 2149 06/29/11 0600  NA 139 138 141  K 3.2* 3.3* 3.4*  CL 107 101 108  CO2 -- 22 21  GLUCOSE 111* 114* 95  BUN 58* 54* 43*  CREATININE 4.60* 4.12* 4.48*  ALB -- -- --  CALCIUM -- 11.5* 12.2*  PHOS -- -- --   Liver Function Tests:  Lab 07/04/11 0901 07/03/11 2149  AST 27 29    ALT 15 16  ALKPHOS 194* 201*  BILITOT 1.0 0.9  PROT 6.1 6.7  ALBUMIN 3.1* 3.5   No results found for this basename: LIPASE:3,AMYLASE:3 in the last 168 hours No results found for this basename: AMMONIA:3 in the last 168 hours CBC:  Lab 07/05/11 0924 07/05/11 0700 07/04/11 1757 07/04/11 0945 07/04/11 0901 07/03/11 2149 06/29/11 0600  WBC -- 14.6* -- -- -- 19.2* 10.8*  NEUTROABS -- 4.1 -- -- -- -- --  HGB 9.7* 8.8* 9.0* -- 7.6* -- --  HCT 26.9* 24.4* 25.2* -- 21.1* -- --  MCV -- 84.4 -- -- -- 87.6 90.8  PLT -- 35* -- 44* -- 20* 15*   PT/INR: @labrcntip (inr:5) Cardiac Enzymes: No results found for this basename: CKTOTAL:5,CKMB:5,CKMBINDEX:5,TROPONINI:5 in the last 168 hours CBG: No results found for this basename: GLUCAP:5 in the last 168 hours  Iron Studies: No results found for this basename: IRON:30,TIBC:30,TRANSFERRIN:30,FERRITIN:30 in the last 168 hours  Xrays/Other Studies: Dg Chest 2 View  07/04/2011  *RADIOLOGY REPORT*  Clinical Data: Leukocytosis, concern for multiple myeloma  CHEST - 2 VIEW  Comparison: 06/28/2011  Findings: Chronic interstitial markings. No pleural effusion or pneumothorax.  Cardiomediastinal silhouette is within normal limits.  Mild degenerative changes of the visualized thoracolumbar spine. Right posterolateral 8th rib fracture deformity, unchanged.  IMPRESSION: No evidence of acute cardiopulmonary disease.  Right posterolateral 8th rib fracture deformity.  Original Report Authenticated By: Charline Bills, M.D.   Dg Bone Survey Met  07/04/2011  *RADIOLOGY REPORT*  Clinical Data: Pain, history of prostate cancer, concern for multiple myeloma  METASTATIC BONE SURVEY  Comparison: None.  Findings: No lytic lesion is seen in the calvarium.  Moderate multilevel degenerative changes of the cervical spine. No fracture or dislocation is seen.  Mild multilevel degenerative changes of the visualized thoracic spine.  Mild superior endplate compression deformity at  T12.  Mild multilevel degenerative changes of the lumbar spine.  Mild superior endplate compression deformity at L3.  No lytic lesions are seen in the visualized appendicular skeleton and pelvis.  The visualized bony pelvis appears intact.  IMPRESSION: No lytic lesions are seen in the visualized axial or appendicular skeleton.  Mild superior endplate compression deformities at T12 and L3.  Per CMS PQRS reporting requirements (PQRS Measure 24): Given the patient's age of greater than 50 and the fracture site (spine), the patient should be tested for osteoporosis using DXA, and the appropriate treatment considered based on the DXA results.  Original Report Authenticated By: Charline Bills, M.D.    Assessment/Plan 1)  Renal failure associated with hypercalcemia, thrombocytopenia and compression fracture of the  spine. No old creatinines.  Worrisome for multiple myeloma.  Agree with management including SPEP, UPEP, IVF's, bone scan, BM bx as per oncology rec's.  No volume excess and nonoliguric at this time.  No indication for acute HD.  Will follow. 2)  HTN, 2-3 years duration 3)  Hx prostate Ca 2008 with prostatectomy 4)  Thrombocytopenia, severe 5)  Hypercalcemia -- s/p pamidronate.  Increase NS 150 cc/hr.  Avoid lasix, can worsen myeloma kidney.   Elaysha Bevard D 07/05/2011, 10:48 AM Cell #  [phone]

## 2011-07-05 NOTE — Progress Notes (Signed)
[name] [id number] is a 65 y.o. male,  Outpatient Primary MD for the patient is [name], MD, MD  Chief Complaint  Patient presents with  . Abnormal Lab        Subjective:   [name] today has, No headache, No chest pain, No abdominal pain - No Nausea, No new weakness tingling or numbness, No Cough - SOB.  No nose bleed.  Objective:   Filed Vitals:   07/04/11 2130 07/04/11 2215 07/05/11 0545 07/05/11 1013  BP: 153/74 142/71 125/63 153/75  Pulse: 79 85 75 84  Temp: 98 F (36.7 C) 97.9 F (36.6 C) 98.2 F (36.8 C) 97.6 F (36.4 C)  TempSrc: Oral Oral Oral Oral  Resp: 18 18 18 19   Weight:      SpO2:   97% 95%    Wt Readings from Last 3 Encounters:  07/04/11 96 kg (211 lb 10.3 oz)  07/03/11 94.856 kg (209 lb 1.9 oz)  06/28/11 100.245 kg (221 lb)     Intake/Output Summary (Last 24 hours) at 07/05/11 1115 Last data filed at 07/05/11 0900  Gross per 24 hour  Intake    891 ml  Output   1125 ml  Net   -234 ml    Exam Awake Alert, Oriented *3, No new F.N deficits, Normal affect [place].AT,PERRAL Supple Neck,No JVD, No cervical lymphadenopathy appriciated.  Symmetrical Chest wall movement, Good air movement bilaterally, CTAB RRR,No Gallops,Rubs or new Murmurs, No Parasternal Heave +ve B.Sounds, Abd Soft, Non tender, No organomegaly appriciated, No rebound -guarding or rigidity. No Cyanosis, Clubbing or edema, No new Rash or bruise     Data Review  CBC  Lab 07/05/11 0924 07/05/11 0700 07/04/11 1757 07/04/11 0945 07/04/11 0901 07/03/11 2210 07/03/11 2149 06/29/11 0600  WBC -- 14.6* -- -- -- -- 19.2* 10.8*  HGB 9.7* 8.8* 9.0* -- 7.6* 6.1* -- --  HCT 26.9* 24.4* 25.2* -- 21.1* 18.0* -- --  PLT -- 35* -- 44* -- -- 20* 15*  MCV -- 84.4 -- -- -- -- 87.6 90.8  MCH -- 30.4 -- -- -- -- 32.1 31.9  MCHC -- 36.1* -- -- -- -- 36.6* 35.2  RDW -- 14.8 -- -- -- -- 13.3 13.1  LYMPHSABS -- 4.5* -- -- -- -- -- --  MONOABS -- 4.7* -- -- -- -- -- --  EOSABS  -- 0.3 -- -- -- -- -- --  BASOSABS -- 1.0* -- -- -- -- -- --  BANDABS -- -- -- -- -- -- -- --    Chemistries   Lab 07/04/11 0901 07/03/11 2210 07/03/11 2149 06/29/11 0600  NA -- 139 138 141  K -- 3.2* 3.3* 3.4*  CL -- 107 101 108  CO2 -- -- 22 21  GLUCOSE -- 111* 114* 95  BUN -- 58* 54* 43*  CREATININE -- 4.60* 4.12* 4.48*  CALCIUM -- -- 11.5* 12.2*  MG -- -- -- --  AST 27 -- 29 --  ALT 15 -- 16 --  ALKPHOS 194* -- 201* --  BILITOT 1.0 -- 0.9 --   ------------------------------------------------------------------------------------------------------------------  Basename 07/05/11 0700 07/04/11 0901  VITAMINB12 332 --  FOLATE -- --  FERRITIN -- --  TIBC -- --  IRON -- --  RETICCTPCT -- 1.4    Coagulation profile  Lab 07/04/11 0945 07/03/11 2149  INR 1.31 1.33  PROTIME -- --     Basename 07/04/11 0945  DDIMER 2.81*    Micro Results Recent Results (from the past 240  hour(s))  MRSA PCR SCREENING     Status: Normal   Collection Time   06/27/11  6:25 PM      Component Value Range Status Comment   MRSA by PCR NEGATIVE  NEGATIVE  Final     Radiology Reports Dg Chest 2 View  07/04/2011  *RADIOLOGY REPORT*  Clinical Data: Leukocytosis, concern for multiple myeloma  CHEST - 2 VIEW  Comparison: 06/28/2011  Findings: Chronic interstitial markings. No pleural effusion or pneumothorax.  Cardiomediastinal silhouette is within normal limits.  Mild degenerative changes of the visualized thoracolumbar spine. Right posterolateral 8th rib fracture deformity, unchanged.  IMPRESSION: No evidence of acute cardiopulmonary disease.  Right posterolateral 8th rib fracture deformity.  Original Report Authenticated By: Charline Bills, M.D.    US Renal  06/29/2011  *RADIOLOGY REPORT*  Clinical Data: Acute renal failure.  RENAL/URINARY TRACT ULTRASOUND COMPLETE  Comparison:  CT abdomen dated 10/24/2008  Findings:  Right Kidney:  12.6 cm in length.  Small stone in the upper pole. No  hydronephrosis.  Slight increase in the renal parenchyma echogenicity suggesting renal medical disease.  Left Kidney:  12.7 cm in length.  2.7 cm simple appearing cyst laterally in the midportion, slightly larger than on the prior study.  6 mm stone in the upper pole.  No hydronephrosis.  Slightly echogenic renal parenchyma suggesting renal medical disease.  Bladder:  Normal.  IMPRESSION: No obstruction.  Slight increased echogenicity of the renal parenchyma consistent with renal medical disease.  Bilateral renal stones.  Note is made of prominent sludge in the gallbladder.  Original Report Authenticated By: Gwynn Burly, M.D.   Dg Bone Survey Met  07/04/2011  *RADIOLOGY REPORT*  Clinical Data: Pain, history of prostate cancer, concern for multiple myeloma  METASTATIC BONE SURVEY  Comparison: None.  Findings: No lytic lesion is seen in the calvarium.  Moderate multilevel degenerative changes of the cervical spine. No fracture or dislocation is seen.  Mild multilevel degenerative changes of the visualized thoracic spine.  Mild superior endplate compression deformity at T12.  Mild multilevel degenerative changes of the lumbar spine.  Mild superior endplate compression deformity at L3.  No lytic lesions are seen in the visualized appendicular skeleton and pelvis.  The visualized bony pelvis appears intact.  IMPRESSION: No lytic lesions are seen in the visualized axial or appendicular skeleton.  Mild superior endplate compression deformities at T12 and L3.  Per CMS PQRS reporting requirements (PQRS Measure 24): Given the patient's age of greater than 50 and the fracture site (spine), the patient should be tested for osteoporosis using DXA, and the appropriate treatment considered based on the DXA results.  Original Report Authenticated By: Charline Bills, M.D.    Scheduled Meds:   . antiseptic oral rinse  15 mL Mouth Rinse BID  . pantoprazole (PROTONIX) IV  40 mg Intravenous Q24H  . phytonadione  5 mg  Oral Daily  . white petrolatum       Continuous Infusions:   . sodium chloride 75 mL/hr at 07/05/11 0934   PRN Meds:.acetaminophen, acetaminophen, alum & mag hydroxide-simeth, ondansetron (ZOFRAN) IV, ondansetron, zolpidem  Assessment & Plan   Anemia associated with acute blood loss (07/04/2011):` Likely secondary to epistaxis, melena due to epistaxis(swallowed blood) in setting thrombocytopenia, plus bone marrow suppression.  Patient received 2 units PRBC. HB after transfusion stable. Will continue to monitor HB trend. Haem following, IV PPI continue for now.   Thrombocytopenia (06/27/2011)/Anemia"  Concern for BM involvement with multiple myeloma. Workup during prior admission  is as follow: PSA 0.22, reticulocyte count 0.8, haptoglobin: 157NML, D. Dimer 2.4, fibrinogen 414 NML, INR 1.16, PT 15, PTT 52, no Schistocytes. Haem following will transfuse if needed. Check CBC in am.   Epistaxis (07/04/2011) Resolved- monitor H&H and plts.   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 [place] stable.   Leukocytosis: Afebrile, CXR stable, no cough, Afebrile, check UA.   DVT Prophylaxis   SCDs  See all Orders from today for further details     Leroy Sea M.D 07/05/2011, 11:15 AM  [facility] Office  [phone]

## 2011-07-06 ENCOUNTER — Encounter (HOSPITAL_COMMUNITY): Payer: BC Managed Care – PPO

## 2011-07-06 ENCOUNTER — Inpatient Hospital Stay (HOSPITAL_COMMUNITY): Payer: BC Managed Care – PPO

## 2011-07-06 LAB — RENAL FUNCTION PANEL
Albumin: 3.1 g/dL — ABNORMAL LOW (ref 3.5–5.2)
Chloride: 113 mEq/L — ABNORMAL HIGH (ref 96–112)
GFR calc Af Amer: 19 mL/min — ABNORMAL LOW (ref >90)
GFR calc non Af Amer: 16 mL/min — ABNORMAL LOW (ref >90)
Phosphorus: 3.1 mg/dL (ref 2.3–4.6)
Potassium: 3.3 mEq/L — ABNORMAL LOW (ref 3.5–5.1)
Sodium: 143 mEq/L (ref 135–145)

## 2011-07-06 LAB — CBC
HCT: 24.7 % — ABNORMAL LOW (ref 39.0–52.0)
MCHC: 35.6 g/dL (ref 30.0–36.0)
Platelets: 31 10*3/uL — ABNORMAL LOW (ref 150–400)
RDW: 15.2 % (ref 11.5–15.5)
WBC: 13.2 10*3/uL — ABNORMAL HIGH (ref 4.0–10.5)

## 2011-07-06 LAB — URINALYSIS, ROUTINE W REFLEX MICROSCOPIC
Leukocytes, UA: NEGATIVE
Nitrite: NEGATIVE
Protein, ur: 30 mg/dL — AB
Specific Gravity, Urine: 1.011 (ref 1.005–1.030)
Urobilinogen, UA: 0.2 mg/dL (ref 0.0–1.0)

## 2011-07-06 LAB — HEPATITIS B SURFACE ANTIGEN: Hepatitis B Surface Ag: NEGATIVE

## 2011-07-06 LAB — URINE MICROSCOPIC-ADD ON

## 2011-07-06 MED ORDER — FUROSEMIDE 80 MG PO TABS
80.0000 mg | ORAL_TABLET | Freq: Two times a day (BID) | ORAL | Status: DC
  Administered 2011-07-06 – 2011-07-11 (×9): 80 mg via ORAL
  Filled 2011-07-06 (×12): qty 1

## 2011-07-06 MED ORDER — TECHNETIUM TC 99M MEDRONATE IV KIT
25.0000 | PACK | Freq: Once | INTRAVENOUS | Status: AC | PRN
  Administered 2011-07-06: 25 via INTRAVENOUS

## 2011-07-06 MED ORDER — PANTOPRAZOLE SODIUM 40 MG PO TBEC
40.0000 mg | DELAYED_RELEASE_TABLET | Freq: Every day | ORAL | Status: DC
  Administered 2011-07-07 – 2011-07-13 (×7): 40 mg via ORAL
  Filled 2011-07-06 (×6): qty 1

## 2011-07-06 MED ORDER — POTASSIUM CHLORIDE CRYS ER 20 MEQ PO TBCR
40.0000 meq | EXTENDED_RELEASE_TABLET | Freq: Once | ORAL | Status: AC
  Administered 2011-07-06: 40 meq via ORAL
  Filled 2011-07-06: qty 2

## 2011-07-06 NOTE — Progress Notes (Signed)
S:appetite improving  No sob O:BP 140/72  Pulse 85  Temp(Src) 98 F (36.7 C) (Oral)  Resp 18  Wt 97.9 kg (215 lb 13.3 oz)  SpO2 95%  Intake/Output Summary (Last 24 hours) at 07/06/11 1333 Last data filed at 07/06/11 0900  Gross per 24 hour  Intake 7544.17 ml  Output   1600 ml  Net 5944.17 ml   Weight change:  ZOX:WRUEA and alert CVS:rrr Resp:clear Abd:+BS NTND Ext:no edema NEURO:answers questions and follows commands  Ox3 but unable to tell me who the president is      . antiseptic oral rinse  15 mL Mouth Rinse BID  . magnesium sulfate IVPB  1 g Intravenous Once  . pantoprazole (PROTONIX) IV  40 mg Intravenous Q24H  . phytonadione  5 mg Oral Daily  . potassium chloride  10 mEq Intravenous Q1 Hr x 4  . potassium chloride  40 mEq Oral BID  . potassium chloride  40 mEq Oral Once  . DISCONTD: magnesium sulfate  1 g Intravenous Once   Dg Chest 2 View  07/04/2011  *RADIOLOGY REPORT*  Clinical Data: Leukocytosis, concern for multiple myeloma  CHEST - 2 VIEW  Comparison: 06/28/2011  Findings: Chronic interstitial markings. No pleural effusion or pneumothorax.  Cardiomediastinal silhouette is within normal limits.  Mild degenerative changes of the visualized thoracolumbar spine. Right posterolateral 8th rib fracture deformity, unchanged.  IMPRESSION: No evidence of acute cardiopulmonary disease.  Right posterolateral 8th rib fracture deformity.  Original Report Authenticated By: Charline Bills, M.D.   Dg Bone Survey Met  07/04/2011  *RADIOLOGY REPORT*  Clinical Data: Pain, history of prostate cancer, concern for multiple myeloma  METASTATIC BONE SURVEY  Comparison: None.  Findings: No lytic lesion is seen in the calvarium.  Moderate multilevel degenerative changes of the cervical spine. No fracture or dislocation is seen.  Mild multilevel degenerative changes of the visualized thoracic spine.  Mild superior endplate compression deformity at T12.  Mild multilevel degenerative changes  of the lumbar spine.  Mild superior endplate compression deformity at L3.  No lytic lesions are seen in the visualized appendicular skeleton and pelvis.  The visualized bony pelvis appears intact.  IMPRESSION: No lytic lesions are seen in the visualized axial or appendicular skeleton.  Mild superior endplate compression deformities at T12 and L3.  Per CMS PQRS reporting requirements (PQRS Measure 24): Given the patient's age of greater than 50 and the fracture site (spine), the patient should be tested for osteoporosis using DXA, and the appropriate treatment considered based on the DXA results.  Original Report Authenticated By: Charline Bills, M.D.   BMET    Component Value Date/Time   NA 143 07/06/2011 0640   K 3.3* 07/06/2011 0640   CL 113* 07/06/2011 0640   CO2 20 07/06/2011 0640   GLUCOSE 91 07/06/2011 0640   BUN 36* 07/06/2011 0640   CREATININE 3.61* 07/06/2011 0640   CALCIUM 9.8 07/06/2011 0640   GFRNONAA 16* 07/06/2011 0640   GFRAA 19* 07/06/2011 0640   CBC    Component Value Date/Time   WBC 13.2* 07/06/2011 0640   RBC 2.89* 07/06/2011 0640   HGB 8.8* 07/06/2011 0640   HCT 24.7* 07/06/2011 0640   PLT 31* 07/06/2011 0640   MCV 85.5 07/06/2011 0640   MCH 30.4 07/06/2011 0640   MCHC 35.6 07/06/2011 0640   RDW 15.2 07/06/2011 0640   LYMPHSABS 4.5* 07/05/2011 0700   MONOABS 4.7* 07/05/2011 0700   EOSABS 0.3 07/05/2011 0700   BASOSABS 1.0* 07/05/2011  0700     Assessment:  1. 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 Hypercalcemia, improved   Plan: Await studies Renal fx relatively stable and will cont to follow   [name] T

## 2011-07-06 NOTE — Progress Notes (Signed)
[name] [id number] is a 65 y.o. male,  Outpatient Primary MD for the patient is Jeremiah Nora, MD, MD  Chief Complaint  Patient presents with  . Abnormal Lab        Subjective:   [place] today has, No headache, No chest pain, No abdominal pain - No Nausea, No new weakness tingling or numbness, No Cough - SOB.  No nose bleed stool almost brown now.  Objective:   Filed Vitals:   07/05/11 1800 07/05/11 2149 07/06/11 0546 07/06/11 1000  BP: 147/73 160/67 136/67 140/72  Pulse: 81 82 76 85  Temp: 97.6 F (36.4 C) 98 F (36.7 C) 98.2 F (36.8 C) 98 F (36.7 C)  TempSrc: Oral Oral Oral Oral  Resp: 20 18 18 18   Weight:  97.9 kg (215 lb 13.3 oz)    SpO2: 96% 96% 96% 95%    Wt Readings from Last 3 Encounters:  07/05/11 97.9 kg (215 lb 13.3 oz)  07/03/11 94.856 kg (209 lb 1.9 oz)  06/28/11 100.245 kg (221 lb)     Intake/Output Summary (Last 24 hours) at 07/06/11 1432 Last data filed at 07/06/11 0900  Gross per 24 hour  Intake 7544.17 ml  Output   1600 ml  Net 5944.17 ml    Exam Awake Alert, Oriented *3, No new F.N deficits, Normal affect [place].AT,PERRAL Supple Neck,No JVD, No cervical lymphadenopathy appriciated.  Symmetrical Chest wall movement, Good air movement bilaterally, CTAB RRR,No Gallops,Rubs or new Murmurs, No Parasternal Heave +ve B.Sounds, Abd Soft, Non tender, No organomegaly appriciated, No rebound -guarding or rigidity. No Cyanosis, Clubbing or edema, No new Rash or bruise     Data Review  CBC  Lab 07/06/11 0640 07/05/11 0924 07/05/11 0700 07/04/11 1757 07/04/11 0945 07/04/11 0901 07/03/11 2149  WBC 13.2* -- 14.6* -- -- -- 19.2*  HGB 8.8* 9.7* 8.8* 9.0* -- 7.6* --  HCT 24.7* 26.9* 24.4* 25.2* -- 21.1* --  PLT 31* -- 35* -- 44* -- 20*  MCV 85.5 -- 84.4 -- -- -- 87.6  MCH 30.4 -- 30.4 -- -- -- 32.1  MCHC 35.6 -- 36.1* -- -- -- 36.6*  RDW 15.2 -- 14.8 -- -- -- 13.3  LYMPHSABS -- -- 4.5* -- -- -- --  MONOABS -- -- 4.7* -- -- --  --  EOSABS -- -- 0.3 -- -- -- --  BASOSABS -- -- 1.0* -- -- -- --  BANDABS -- -- -- -- -- -- --    Chemistries   Lab 07/06/11 0640 07/05/11 1255 07/04/11 0901 07/03/11 2210 07/03/11 2149  NA 143 142 -- 139 138  K 3.3* 2.7* -- 3.2* 3.3*  CL 113* 109 -- 107 101  CO2 20 21 -- -- 22  GLUCOSE 91 104* -- 111* 114*  BUN 36* 43* -- 58* 54*  CREATININE 3.61* 3.59* -- 4.60* 4.12*  CALCIUM 9.8 10.3 -- -- 11.5*  MG -- -- -- -- --  AST -- -- 27 -- 29  ALT -- -- 15 -- 16  ALKPHOS -- -- 194* -- 201*  BILITOT -- -- 1.0 -- 0.9   ------------------------------------------------------------------------------------------------------------------  Basename 07/05/11 0700 07/04/11 0901  VITAMINB12 332 --  FOLATE -- --  FERRITIN -- --  TIBC -- --  IRON -- --  RETICCTPCT -- 1.4    Coagulation profile  Lab 07/04/11 0945 07/03/11 2149  INR 1.31 1.33  PROTIME -- --     Basename 07/04/11 0945  DDIMER 2.81*    Micro Results Recent Results (  from the past 240 hour(s))  MRSA PCR SCREENING     Status: Normal   Collection Time   06/27/11  6:25 PM      Component Value Range Status Comment   MRSA by PCR NEGATIVE  NEGATIVE  Final   PATHOLOGIST SMEAR REVIEW     Status: Normal   Collection Time   07/05/11  7:00 AM      Component Value Range Status Comment   Tech Review MODERATE ANEMIA WITH NORMAL INDICES   Final     Radiology Reports Dg Chest 2 View  07/04/2011  *RADIOLOGY REPORT*  Clinical Data: Leukocytosis, concern for multiple myeloma  CHEST - 2 VIEW  Comparison: 06/28/2011  Findings: Chronic interstitial markings. No pleural effusion or pneumothorax.  Cardiomediastinal silhouette is within normal limits.  Mild degenerative changes of the visualized thoracolumbar spine. Right posterolateral 8th rib fracture deformity, unchanged.  IMPRESSION: No evidence of acute cardiopulmonary disease.  Right posterolateral 8th rib fracture deformity.  Original Report Authenticated By: Charline Bills, M.D.      US Renal  06/29/2011  *RADIOLOGY REPORT*  Clinical Data: Acute renal failure.  RENAL/URINARY TRACT ULTRASOUND COMPLETE  Comparison:  CT abdomen dated 10/24/2008  Findings:  Right Kidney:  12.6 cm in length.  Small stone in the upper pole. No hydronephrosis.  Slight increase in the renal parenchyma echogenicity suggesting renal medical disease.  Left Kidney:  12.7 cm in length.  2.7 cm simple appearing cyst laterally in the midportion, slightly larger than on the prior study.  6 mm stone in the upper pole.  No hydronephrosis.  Slightly echogenic renal parenchyma suggesting renal medical disease.  Bladder:  Normal.  IMPRESSION: No obstruction.  Slight increased echogenicity of the renal parenchyma consistent with renal medical disease.  Bilateral renal stones.  Note is made of prominent sludge in the gallbladder.  Original Report Authenticated By: Gwynn Burly, M.D.   Dg Bone Survey Met  07/04/2011  *RADIOLOGY REPORT*  Clinical Data: Pain, history of prostate cancer, concern for multiple myeloma  METASTATIC BONE SURVEY  Comparison: None.  Findings: No lytic lesion is seen in the calvarium.  Moderate multilevel degenerative changes of the cervical spine. No fracture or dislocation is seen.  Mild multilevel degenerative changes of the visualized thoracic spine.  Mild superior endplate compression deformity at T12.  Mild multilevel degenerative changes of the lumbar spine.  Mild superior endplate compression deformity at L3.  No lytic lesions are seen in the visualized appendicular skeleton and pelvis.  The visualized bony pelvis appears intact.  IMPRESSION: No lytic lesions are seen in the visualized axial or appendicular skeleton.  Mild superior endplate compression deformities at T12 and L3.  Per CMS PQRS reporting requirements (PQRS Measure 24): Given the patient's age of greater than 50 and the fracture site (spine), the patient should be tested for osteoporosis using DXA, and the appropriate treatment  considered based on the DXA results.  Original Report Authenticated By: Charline Bills, M.D.    Scheduled Meds:    . antiseptic oral rinse  15 mL Mouth Rinse BID  . magnesium sulfate IVPB  1 g Intravenous Once  . pantoprazole (PROTONIX) IV  40 mg Intravenous Q24H  . phytonadione  5 mg Oral Daily  . potassium chloride  10 mEq Intravenous Q1 Hr x 4  . potassium chloride  40 mEq Oral BID  . potassium chloride  40 mEq Oral Once  . DISCONTD: magnesium sulfate  1 g Intravenous Once   Continuous Infusions:    .  sodium chloride 100 mL/hr at 07/05/11 2300   PRN Meds:.acetaminophen, acetaminophen, alum & mag hydroxide-simeth, ondansetron (ZOFRAN) IV, ondansetron, technetium medronate, zolpidem  Assessment & Plan   Anemia associated with acute blood loss (07/04/2011):` Likely secondary to epistaxis, melena due to epistaxis(swallowed blood) in setting thrombocytopenia, plus bone marrow suppression.  Patient received 2 units PRBC. HB after transfusion stable. Will continue to monitor HB trend. Haem following,switch to PO  PPI   now.   Thrombocytopenia (06/27/2011)/Anemia"  Concern for BM involvement with multiple myeloma. Workup during prior admission is as follow: PSA 0.22, reticulocyte count 0.8, haptoglobin: 157NML, D. Dimer 2.4, fibrinogen 414 NML, INR 1.16, PT 15, PTT 52, no Schistocytes. Haem following will transfuse if needed. Check CBC again in am.   Epistaxis (07/04/2011) Resolved- monitor H&H and plts.   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 [place] stable. Renal on board.   Leukocytosis: Afebrile, CXR stable, no cough, Afebrile, stable UA. WBC trending lower.  Hypokalemia - replaced IV yesterday, PO today, monitor.   DVT Prophylaxis   SCDs  See all Orders from today for further details     Leroy Sea M.D 07/06/2011, 2:32 PM  [facility] Office  [phone]

## 2011-07-06 NOTE — Assessment & Plan Note (Signed)
Well controlled. Continue current medication.  

## 2011-07-06 NOTE — Progress Notes (Signed)
Utilization Review Completed.  Latoyna Hird, [place] T  07/06/2011

## 2011-07-06 NOTE — Assessment & Plan Note (Signed)
DEXA results nml. Get MRI spine to eval for pathologic cause of fracture.  Not tolerating oral pain meds... Refer to IR for consideration of kyphoplasty.  ? Pain causing weight loss or other ongoing issue.

## 2011-07-06 NOTE — Progress Notes (Signed)
Patient ID: [name], male   DOB: Mar 22, 1946, 65 y.o.   MRN: [id number]   Subjective: Mr. [name] is a 65 year old patient well known to me with a history of prostate cancer s/p surgical treatment with a radical prostatectomy in 2008. He was noted to have a rising PSA in September and a more recent PSA rise to 0.22 indicating likely biochemically recurrent disease.  He did have a stone removed at his urethral anastomosis last year but has no history of nephrolithiasis.  I was notified of his admission by Dr. [name] last Friday and have reviewed the recent events including his acute renal failure, new onset back pain with detected lumbar vertebral fracture, and weight loss of 30 lbs/anorexia.  Objective: Vital signs in last 24 hours: Temp:  [97.6 F (36.4 C)-98.2 F (36.8 C)] 97.8 F (36.6 C) (12/10 2123) Pulse Rate:  [76-90] 80  (12/10 2123) Resp:  [18-20] 18  (12/10 2123) BP: (123-159)/(67-78) 123/74 mmHg (12/10 2123) SpO2:  [95 %-97 %] 95 % (12/10 2123) Weight:  [96.5 kg (212 lb 11.9 oz)] 212 lb 11.9 oz (96.5 kg) (12/10 2123)  Intake/Output from previous day: 12/09 0701 - 12/10 0700 In: 7905.2 [P.O.:1200; I.V.:6304.2; IV Piggyback:400] Out: 2750 [Urine:2750] Intake/Output this shift: Total I/O In: -  Out: 1425 [Urine:1425]  Physical Exam:  General: Alert and oriented CV: RRR Abdomen: Soft, ND  Lab Results:  Basename 07/06/11 0640 07/05/11 0924 07/05/11 0700  HGB 8.8* 9.7* 8.8*  HCT 24.7* 26.9* 24.4*   BMET  Basename 07/06/11 0640 07/05/11 1255  NA 143 142  K 3.3* 2.7*  CL 113* 109  CO2 20 21  GLUCOSE 91 104*  BUN 36* 43*  CREATININE 3.61* 3.59*  CALCIUM 9.8 10.3     Studies/Results: Nm Bone Scan Whole Body  07/06/2011  *RADIOLOGY REPORT*  Clinical Data: History of prostate cancer.  Back pain and hypercalcemia.  Compression fractures of T12 and L3.  NUCLEAR MEDICINE WHOLE BODY BONE SCINTIGRAPHY  Technique:  Whole body anterior and posterior images  were obtained approximately 3 hours after intravenous injection of radiopharmaceutical.  Radiopharmaceutical: 25.3 mCi of technetium MDP  Comparison: Bone survey dated 07/04/2011  Findings: The scan demonstrates increased activity at the posterior lateral aspect of the right eighth rib which correlates with a healing or healed fracture visible on the chest x-ray of 07/04/2011.  There are no areas of abnormal activity in the spine. Specifically, no evidence of abnormal activity in the T12 L3 vertebra.  No findings suggestive of metastatic prostate cancer.  Free technetium is seen in the stomach due to imperfect tag of the agent.  IMPRESSION: Healing fracture of the posterior lateral aspect of the right eighth rib.  No other significant abnormalities.   Specifically, no abnormal activity in T12 or L3. Possibility of myeloma should be considered since the fractures are not visible on 10/24/2008.  Original Report Authenticated By: Gwynn Burly, M.D.   Renal ultrasound: No hydronephrosis.  Assessment/Plan: 1. Acute renal failure/back pain/weight loss: It would be extremely unlikely that his symptoms are related to his prostate cancer. While his PSA has risen, it remains very low and it would therefore be very unlikely that he would develop measurable metastatic disease much less symptomatic disease in this setting. I would strongly favor alternative explanations including myeloma as possibilities.  2. Biochemically recurrent prostate cancer: He will continue surveillance monitoring with a repeat PSA in 3-4 months.   LOS: 3 days   Kaneesha Constantino,LES 07/06/2011, 10:05  PM

## 2011-07-06 NOTE — Progress Notes (Signed)
   CARE MANAGEMENT NOTE 07/06/2011  Patient:  [name], [name]   Account Number:  [id number]  Date Initiated:  07/06/2011  Documentation initiated by:  Junius Creamer  Subjective/Objective Assessment:   adm w anemia,blood loss     Action/Plan:   lives alone, pcp dr [name], has supp friend that will help after disch   Anticipated DC Date:  07/08/2011   Anticipated DC Plan:  HOME/SELF CARE      DC Planning Services  CM consult      Choice offered to / List presented to:             Status of service:   Medicare Important Message given?   (If response is "NO", the following Medicare IM given date fields will be blank) Date Medicare IM given:   Date Additional Medicare IM given:    Discharge Disposition:    Per UR Regulation:  Reviewed for med. necessity/level of care/duration of stay  Comments:  12/10 for pt eval, may need hhc, debbie Ellwood Steidle rn,bsn [phone]

## 2011-07-07 ENCOUNTER — Inpatient Hospital Stay (HOSPITAL_COMMUNITY): Payer: BC Managed Care – PPO

## 2011-07-07 ENCOUNTER — Other Ambulatory Visit: Payer: Self-pay | Admitting: Oncology

## 2011-07-07 LAB — TYPE AND SCREEN
ABO/RH(D): O POS
Antibody Screen: NEGATIVE
Unit division: 0
Unit division: 0

## 2011-07-07 LAB — CBC
HCT: 25.6 % — ABNORMAL LOW (ref 39.0–52.0)
Hemoglobin: 9 g/dL — ABNORMAL LOW (ref 13.0–17.0)
MCH: 30.5 pg (ref 26.0–34.0)
MCHC: 35.2 g/dL (ref 30.0–36.0)
MCV: 86.8 fL (ref 78.0–100.0)
RBC: 2.95 MIL/uL — ABNORMAL LOW (ref 4.22–5.81)

## 2011-07-07 LAB — GLUCOSE, CAPILLARY: Glucose-Capillary: 107 mg/dL — ABNORMAL HIGH (ref 70–99)

## 2011-07-07 LAB — BASIC METABOLIC PANEL
BUN: 30 mg/dL — ABNORMAL HIGH (ref 6–23)
CO2: 20 mEq/L (ref 19–32)
Calcium: 10 mg/dL (ref 8.4–10.5)
Creatinine, Ser: 3.64 mg/dL — ABNORMAL HIGH (ref 0.50–1.35)
GFR calc non Af Amer: 16 mL/min — ABNORMAL LOW (ref >90)
Glucose, Bld: 101 mg/dL — ABNORMAL HIGH (ref 70–99)

## 2011-07-07 LAB — PROTEIN ELECTROPHORESIS, SERUM
Albumin ELP: 56.4 % (ref 55.8–66.1)
Alpha-1-Globulin: 7.6 % — ABNORMAL HIGH (ref 2.9–4.9)
Alpha-2-Globulin: 14 % — ABNORMAL HIGH (ref 7.1–11.8)
Beta 2: 3.5 % (ref 3.2–6.5)
Beta Globulin: 5.2 % (ref 4.7–7.2)

## 2011-07-07 LAB — PROTIME-INR: INR: 1.24 (ref 0.00–1.49)

## 2011-07-07 LAB — IGG, IGA, IGM
IgA: 35 mg/dL — ABNORMAL LOW (ref 68–379)
IgG (Immunoglobin G), Serum: 539 mg/dL — ABNORMAL LOW (ref 650–1600)

## 2011-07-07 MED ORDER — POTASSIUM CHLORIDE CRYS ER 20 MEQ PO TBCR
40.0000 meq | EXTENDED_RELEASE_TABLET | Freq: Once | ORAL | Status: AC
  Administered 2011-07-07: 40 meq via ORAL
  Filled 2011-07-07: qty 2

## 2011-07-07 NOTE — H&P (Signed)
[name] is an 65 y.o. male.   Chief Complaint: Leukocytosis; Thrombocytopenia; poss MM HPI: Hx prostate CA Scheduled now for Bone Marrow Biopsy 12/12 in Radiology  Past Medical History  Diagnosis Date  . HTN (hypertension)   . Allergic rhinitis   . Prostate cancer 2008    s/p prostatectomy  . Hyperlipemia   . Hypercalcemia of malignancy     Past Surgical History  Procedure Date  . Kidney stone surgery   . Kidney stone surgery 06/2009    Stone removed from bladder  . Dexa 05/2011    spine 0.7, L femur 0.0, R femur -0.6, normal  . Prostatectomy 04/06/07    Family History  Problem Relation Age of Onset  . Pneumonia Mother   . Stroke Father   . Cancer Brother     lung  . Cancer Sister     bladder   Social History:  reports that he quit smoking about 14 years ago. He does not have any smokeless tobacco history on file. He reports that he drinks alcohol. He reports that he does not use illicit drugs.  Allergies:  Allergies  Allergen Reactions  . Celebrex (Celecoxib) Nausea Only  . Meloxicam Itching  . Percocet (Oxycodone-Acetaminophen) Itching  . Vicodin (Hydrocodone-Acetaminophen) Itching    Medications Prior to Admission  Medication Dose Route Frequency Provider Last Rate Last Dose  . 0.9 %  sodium chloride infusion   Intravenous Continuous Barbette Hair Schertz 100 mL/hr at 07/06/11 1750    . acetaminophen (TYLENOL) tablet 650 mg  650 mg Oral Q6H PRN Ron Parker, MD       Or  . acetaminophen (TYLENOL) suppository 650 mg  650 mg Rectal Q6H PRN Ron Parker, MD      . alum & mag hydroxide-simeth (MAALOX/MYLANTA) 200-200-20 MG/5ML suspension 30 mL  30 mL Oral Q6H PRN Ron Parker, MD   30 mL at 07/05/11 1821  . antiseptic oral rinse (BIOTENE) solution 15 mL  15 mL Mouth Rinse BID Belkys Regalado, MD   15 mL at 07/06/11 2005  . furosemide (LASIX) tablet 80 mg  80 mg Oral BID [name], MD   80 mg at 07/07/11 0855  . magnesium sulfate IVPB  1 g 100 mL  1 g Intravenous Once Belkys Regalado, MD   1 g at 07/05/11 1534  . ondansetron (ZOFRAN) tablet 4 mg  4 mg Oral Q6H PRN Ron Parker, MD       Or  . ondansetron (ZOFRAN) injection 4 mg  4 mg Intravenous Q6H PRN Ron Parker, MD      . pantoprazole (PROTONIX) EC tablet 40 mg  40 mg Oral Q1200 Leroy Sea, MD      . phytonadione (VITAMIN K) tablet 5 mg  5 mg Oral Daily Samul Dada, MD   5 mg at 07/06/11 1027  . potassium chloride 10 mEq in 100 mL IVPB  10 mEq Intravenous Q1 Hr x 4 Leroy Sea, MD   10 mEq at 07/05/11 1933  . potassium chloride SA (K-DUR,KLOR-CON) CR tablet 40 mEq  40 mEq Oral BID Leroy Sea, MD   40 mEq at 07/05/11 2209  . potassium chloride SA (K-DUR,KLOR-CON) CR tablet 40 mEq  40 mEq Oral Once Leroy Sea, MD   40 mEq at 07/06/11 1417  . technetium medronate (TC-MDP) injection 25 milli Curie  25 milli Curie Intravenous Once PRN Medication Radiologist   25 [employer] at  07/06/11 0915  . white petrolatum (VASELINE) gel           . zolpidem (AMBIEN) tablet 5 mg  5 mg Oral QHS PRN Ron Parker, MD      . DISCONTD: 0.9 %  sodium chloride infusion   Intravenous Continuous Carlisle Beers Molpus, MD 125 mL/hr at 07/04/11 0015    . DISCONTD: magnesium sulfate 50 % injection 1 g  1 g Intravenous Once Leroy Sea, MD      . DISCONTD: pantoprazole (PROTONIX) injection 40 mg  40 mg Intravenous Q24H Ron Parker, MD   40 mg at 07/06/11 0335   Medications Prior to Admission  Medication Sig Dispense Refill  . Multiple Vitamin (MULTIVITAMIN) tablet Take 1 tablet by mouth daily.        . simvastatin (ZOCOR) 40 MG tablet Take 20 mg by mouth at bedtime.          Results for orders placed during the hospital encounter of 07/03/11 (from the past 48 hour(s))  RENAL FUNCTION PANEL     Status: Abnormal   Collection Time   07/05/11 12:55 PM      Component Value Range Comment   Sodium 142  135 - 145 (mEq/L)    Potassium 2.7 (*) 3.5 - 5.1  (mEq/L)    Chloride 109  96 - 112 (mEq/L)    CO2 21  19 - 32 (mEq/L)    Glucose, Bld 104 (*) 70 - 99 (mg/dL)    BUN 43 (*) 6 - 23 (mg/dL)    Creatinine, Ser 6.21 (*) 0.50 - 1.35 (mg/dL)    Calcium 30.8  8.4 - 10.5 (mg/dL)    Phosphorus 3.4  2.3 - 4.6 (mg/dL)    Albumin 3.3 (*) 3.5 - 5.2 (g/dL)    GFR calc non Af Amer 16 (*) >90 (mL/min)    GFR calc Af Amer 19 (*) >90 (mL/min)   HEPATITIS B SURFACE ANTIGEN     Status: Normal   Collection Time   07/06/11  6:40 AM      Component Value Range Comment   Hepatitis B Surface Ag NEGATIVE  NEGATIVE    CBC     Status: Abnormal   Collection Time   07/06/11  6:40 AM      Component Value Range Comment   WBC 13.2 (*) 4.0 - 10.5 (K/uL) ADJUSTED FOR NUCLEATED RBC'S   RBC 2.89 (*) 4.22 - 5.81 (MIL/uL)    Hemoglobin 8.8 (*) 13.0 - 17.0 (g/dL)    HCT 65.7 (*) 84.6 - 52.0 (%)    MCV 85.5  78.0 - 100.0 (fL)    MCH 30.4  26.0 - 34.0 (pg)    MCHC 35.6  30.0 - 36.0 (g/dL)    RDW 96.2  95.2 - 84.1 (%)    Platelets 31 (*) 150 - 400 (K/uL) CONSISTENT WITH PREVIOUS RESULT  RENAL FUNCTION PANEL     Status: Abnormal   Collection Time   07/06/11  6:40 AM      Component Value Range Comment   Sodium 143  135 - 145 (mEq/L)    Potassium 3.3 (*) 3.5 - 5.1 (mEq/L)    Chloride 113 (*) 96 - 112 (mEq/L)    CO2 20  19 - 32 (mEq/L)    Glucose, Bld 91  70 - 99 (mg/dL)    BUN 36 (*) 6 - 23 (mg/dL)    Creatinine, Ser 3.24 (*) 0.50 - 1.35 (mg/dL)    Calcium 9.8  8.4 - 10.5 (mg/dL)    Phosphorus 3.1  2.3 - 4.6 (mg/dL)    Albumin 3.1 (*) 3.5 - 5.2 (g/dL)    GFR calc non Af Amer 16 (*) >90 (mL/min)    GFR calc Af Amer 19 (*) >90 (mL/min)   URINALYSIS, ROUTINE W REFLEX MICROSCOPIC     Status: Abnormal   Collection Time   07/06/11  7:25 AM      Component Value Range Comment   Color, Urine YELLOW  YELLOW     APPearance CLEAR  CLEAR     Specific Gravity, Urine 1.011  1.005 - 1.030     pH 6.0  5.0 - 8.0     Glucose, UA NEGATIVE  NEGATIVE (mg/dL)    Hgb urine dipstick  MODERATE (*) NEGATIVE     Bilirubin Urine NEGATIVE  NEGATIVE     Ketones, ur NEGATIVE  NEGATIVE (mg/dL)    Protein, ur 30 (*) NEGATIVE (mg/dL)    Urobilinogen, UA 0.2  0.0 - 1.0 (mg/dL)    Nitrite NEGATIVE  NEGATIVE     Leukocytes, UA NEGATIVE  NEGATIVE    URINE MICROSCOPIC-ADD ON     Status: Normal   Collection Time   07/06/11  7:25 AM      Component Value Range Comment   WBC, UA 0-2  <3 (WBC/hpf)    RBC / HPF 0-2  <3 (RBC/hpf)   TYPE AND SCREEN     Status: Normal   Collection Time   07/06/11  9:12 PM      Component Value Range Comment   ABO/RH(D) O POS      Antibody Screen NEG      Sample Expiration 07/09/2011     CBC     Status: Abnormal   Collection Time   07/07/11  6:34 AM      Component Value Range Comment   WBC 15.3 (*) 4.0 - 10.5 (K/uL) ADJUSTED FOR NUCLEATED RBC'S   RBC 2.95 (*) 4.22 - 5.81 (MIL/uL)    Hemoglobin 9.0 (*) 13.0 - 17.0 (g/dL)    HCT 40.9 (*) 81.1 - 52.0 (%)    MCV 86.8  78.0 - 100.0 (fL)    MCH 30.5  26.0 - 34.0 (pg)    MCHC 35.2  30.0 - 36.0 (g/dL)    RDW 91.4  78.2 - 95.6 (%)    Platelets 27 (*) 150 - 400 (K/uL)   BASIC METABOLIC PANEL     Status: Abnormal   Collection Time   07/07/11  6:34 AM      Component Value Range Comment   Sodium 144  135 - 145 (mEq/L)    Potassium 3.1 (*) 3.5 - 5.1 (mEq/L)    Chloride 112  96 - 112 (mEq/L)    CO2 20  19 - 32 (mEq/L)    Glucose, Bld 101 (*) 70 - 99 (mg/dL)    BUN 30 (*) 6 - 23 (mg/dL)    Creatinine, Ser 2.13 (*) 0.50 - 1.35 (mg/dL)    Calcium 08.6  8.4 - 10.5 (mg/dL)    GFR calc non Af Amer 16 (*) >90 (mL/min)    GFR calc Af Amer 19 (*) >90 (mL/min)    Nm Bone Scan Whole Body  07/06/2011  *RADIOLOGY REPORT*  Clinical Data: History of prostate cancer.  Back pain and hypercalcemia.  Compression fractures of T12 and L3.  NUCLEAR MEDICINE WHOLE BODY BONE SCINTIGRAPHY  Technique:  Whole body anterior and posterior images were obtained approximately 3 hours  after intravenous injection of radiopharmaceutical.   Radiopharmaceutical: 25.3 mCi of technetium MDP  Comparison: Bone survey dated 07/04/2011  Findings: The scan demonstrates increased activity at the posterior lateral aspect of the right eighth rib which correlates with a healing or healed fracture visible on the chest x-ray of 07/04/2011.  There are no areas of abnormal activity in the spine. Specifically, no evidence of abnormal activity in the T12 L3 vertebra.  No findings suggestive of metastatic prostate cancer.  Free technetium is seen in the stomach due to imperfect tag of the agent.  IMPRESSION: Healing fracture of the posterior lateral aspect of the right eighth rib.  No other significant abnormalities.   Specifically, no abnormal activity in T12 or L3. Possibility of myeloma should be considered since the fractures are not visible on 10/24/2008.  Original Report Authenticated By: Gwynn Burly, M.D.    ROS  Blood pressure 157/73, pulse 79, temperature 98.2 F (36.8 C), temperature source Oral, resp. rate 20, height 6\' 4"  (1.93 m), weight 212 lb 11.9 oz (96.5 kg), SpO2 98.00%. Physical Exam   Assessment/Plan Hx prostate ca; leukocytosis and thrombocytopenia; scheduled now for Bone marrow biopsy. Pt aware of procedure benefits and risks and agreeable to proceed. Consent signed.  Vondell Babers A 07/07/2011, 9:24 AM

## 2011-07-07 NOTE — Progress Notes (Signed)
S:appetite good No sob O:BP 157/73  Pulse 79  Temp(Src) 98.2 F (36.8 C) (Oral)  Resp 20  Ht 6\' 4"  (1.93 m)  Wt 96.5 kg (212 lb 11.9 oz)  BMI 25.90 kg/m2  SpO2 98%  Intake/Output Summary (Last 24 hours) at 07/07/11 0952 Last data filed at 07/07/11 0857  Gross per 24 hour  Intake 3378.33 ml  Output   4775 ml  Net -1396.67 ml   Weight change: -1.4 kg (-3 lb 1.4 oz) WUJ:WJXBJ and alert CVS:rrr Resp:clear Abd:+BS NTND Ext:no edema NEURO:answers questions and follows commands  Ox3   . antiseptic oral rinse  15 mL Mouth Rinse BID  . furosemide  80 mg Oral BID  . pantoprazole  40 mg Oral Q1200  . phytonadione  5 mg Oral Daily  . potassium chloride  40 mEq Oral Once  . DISCONTD: pantoprazole (PROTONIX) IV  40 mg Intravenous Q24H   Nm Bone Scan Whole Body  07/06/2011  *RADIOLOGY REPORT*  Clinical Data: History of prostate cancer.  Back pain and hypercalcemia.  Compression fractures of T12 and L3.  NUCLEAR MEDICINE WHOLE BODY BONE SCINTIGRAPHY  Technique:  Whole body anterior and posterior images were obtained approximately 3 hours after intravenous injection of radiopharmaceutical.  Radiopharmaceutical: 25.3 mCi of technetium MDP  Comparison: Bone survey dated 07/04/2011  Findings: The scan demonstrates increased activity at the posterior lateral aspect of the right eighth rib which correlates with a healing or healed fracture visible on the chest x-ray of 07/04/2011.  There are no areas of abnormal activity in the spine. Specifically, no evidence of abnormal activity in the T12 L3 vertebra.  No findings suggestive of metastatic prostate cancer.  Free technetium is seen in the stomach due to imperfect tag of the agent.  IMPRESSION: Healing fracture of the posterior lateral aspect of the right eighth rib.  No other significant abnormalities.   Specifically, no abnormal activity in T12 or L3. Possibility of myeloma should be considered since the fractures are not visible on 10/24/2008.   Original Report Authenticated By: Gwynn Burly, M.D.   BMET    Component Value Date/Time   NA 144 07/07/2011 0634   K 3.1* 07/07/2011 0634   CL 112 07/07/2011 0634   CO2 20 07/07/2011 0634   GLUCOSE 101* 07/07/2011 0634   BUN 30* 07/07/2011 0634   CREATININE 3.64* 07/07/2011 0634   CALCIUM 10.0 07/07/2011 0634   GFRNONAA 16* 07/07/2011 0634   GFRAA 19* 07/07/2011 0634   CBC    Component Value Date/Time   WBC 15.3* 07/07/2011 0634   RBC 2.95* 07/07/2011 0634   HGB 9.0* 07/07/2011 0634   HCT 25.6* 07/07/2011 0634   PLT 27* 07/07/2011 0634   MCV 86.8 07/07/2011 0634   MCH 30.5 07/07/2011 0634   MCHC 35.2 07/07/2011 0634   RDW 15.2 07/07/2011 0634   LYMPHSABS 4.5* 07/05/2011 0700   MONOABS 4.7* 07/05/2011 0700   EOSABS 0.3 07/05/2011 0700   BASOSABS 1.0* 07/05/2011 0700     Assessment:  1. 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 Hypercalcemia, improved 3. Hypokalemia   Plan: Await studies Renal fx relatively stable and will cont to follow For BM BX Replace K   Diamantina Edinger T

## 2011-07-07 NOTE — Progress Notes (Signed)
[name] ZOX:[id number] is a 65 y.o. male,  Outpatient Primary MD for the patient is [name], MD, MD  Chief Complaint  Patient presents with  . Abnormal Lab        Subjective:   [name] today has, No headache, No chest pain, No abdominal pain - No Nausea, No new weakness tingling or numbness, No Cough - SOB.  No nose bleed stool almost brown now.  Objective:   Filed Vitals:   07/06/11 1400 07/06/11 1800 07/06/11 2123 07/07/11 0431  BP: 159/72 145/78 123/74 157/73  Pulse: 90 81 80 79  Temp: 97.6 F (36.4 C) 97.8 F (36.6 C) 97.8 F (36.6 C) 98.2 F (36.8 C)  TempSrc: Oral Oral Oral Oral  Resp: 20 20 18 20   Height:   6\' 4"  (1.93 m)   Weight:   96.5 kg (212 lb 11.9 oz)   SpO2: 97% 97% 95% 98%    Wt Readings from Last 3 Encounters:  07/06/11 96.5 kg (212 lb 11.9 oz)  07/03/11 94.856 kg (209 lb 1.9 oz)  06/28/11 100.245 kg (221 lb)     Intake/Output Summary (Last 24 hours) at 07/07/11 1152 Last data filed at 07/07/11 0857  Gross per 24 hour  Intake 3378.33 ml  Output   4775 ml  Net -1396.67 ml    Exam Awake Alert, Oriented *3, No new F.N deficits, Normal affect [place].AT,PERRAL Supple Neck,No JVD, No cervical lymphadenopathy appriciated.  Symmetrical Chest wall movement, Good air movement bilaterally, CTAB RRR,No Gallops,Rubs or new Murmurs, No Parasternal Heave +ve B.Sounds, Abd Soft, Non tender, No organomegaly appriciated, No rebound -guarding or rigidity. No Cyanosis, Clubbing or edema, No new Rash or bruise     Data Review  CBC  Lab 07/07/11 0634 07/06/11 0640 07/05/11 0924 07/05/11 0700 07/04/11 1757 07/04/11 0945 07/03/11 2149  WBC 15.3* 13.2* -- 14.6* -- -- 19.2*  HGB 9.0* 8.8* 9.7* 8.8* 9.0* -- --  HCT 25.6* 24.7* 26.9* 24.4* 25.2* -- --  PLT 27* 31* -- 35* -- 44* 20*  MCV 86.8 85.5 -- 84.4 -- -- 87.6  MCH 30.5 30.4 -- 30.4 -- -- 32.1  MCHC 35.2 35.6 -- 36.1* -- -- 36.6*  RDW 15.2 15.2 -- 14.8 -- -- 13.3  LYMPHSABS -- -- --  4.5* -- -- --  MONOABS -- -- -- 4.7* -- -- --  EOSABS -- -- -- 0.3 -- -- --  BASOSABS -- -- -- 1.0* -- -- --  BANDABS -- -- -- -- -- -- --    Chemistries   Lab 07/07/11 0634 07/06/11 0640 07/05/11 1255 07/04/11 0901 07/03/11 2210 07/03/11 2149  NA 144 143 142 -- 139 138  K 3.1* 3.3* 2.7* -- 3.2* 3.3*  CL 112 113* 109 -- 107 101  CO2 20 20 21  -- -- 22  GLUCOSE 101* 91 104* -- 111* 114*  BUN 30* 36* 43* -- 58* 54*  CREATININE 3.64* 3.61* 3.59* -- 4.60* 4.12*  CALCIUM 10.0 9.8 10.3 -- -- 11.5*  MG -- -- -- -- -- --  AST -- -- -- 27 -- 29  ALT -- -- -- 15 -- 16  ALKPHOS -- -- -- 194* -- 201*  BILITOT -- -- -- 1.0 -- 0.9   ------------------------------------------------------------------------------------------------------------------  Basename 07/05/11 0700  VITAMINB12 332  FOLATE --  FERRITIN --  TIBC --  IRON --  RETICCTPCT --    Coagulation profile  Lab 07/07/11 0904 07/04/11 0945 07/03/11 2149  INR 1.24 1.31 1.33  PROTIME -- -- --  No results found for this basename: DDIMER:2 in the last 72 hours  Micro Results Recent Results (from the past 240 hour(s))  MRSA PCR SCREENING     Status: Normal   Collection Time   06/27/11  6:25 PM      Component Value Range Status Comment   MRSA by PCR NEGATIVE  NEGATIVE  Final   PATHOLOGIST SMEAR REVIEW     Status: Normal   Collection Time   07/05/11  7:00 AM      Component Value Range Status Comment   Tech Review MODERATE ANEMIA WITH NORMAL INDICES   Final     Radiology Reports Dg Chest 2 View  07/04/2011  *RADIOLOGY REPORT*  Clinical Data: Leukocytosis, concern for multiple myeloma  CHEST - 2 VIEW  Comparison: 06/28/2011  Findings: Chronic interstitial markings. No pleural effusion or pneumothorax.  Cardiomediastinal silhouette is within normal limits.  Mild degenerative changes of the visualized thoracolumbar spine. Right posterolateral 8th rib fracture deformity, unchanged.  IMPRESSION: No evidence of acute  cardiopulmonary disease.  Right posterolateral 8th rib fracture deformity.  Original Report Authenticated By: Charline Bills, M.D.    US Renal  06/29/2011  *RADIOLOGY REPORT*  Clinical Data: Acute renal failure.  RENAL/URINARY TRACT ULTRASOUND COMPLETE  Comparison:  CT abdomen dated 10/24/2008  Findings:  Right Kidney:  12.6 cm in length.  Small stone in the upper pole. No hydronephrosis.  Slight increase in the renal parenchyma echogenicity suggesting renal medical disease.  Left Kidney:  12.7 cm in length.  2.7 cm simple appearing cyst laterally in the midportion, slightly larger than on the prior study.  6 mm stone in the upper pole.  No hydronephrosis.  Slightly echogenic renal parenchyma suggesting renal medical disease.  Bladder:  Normal.  IMPRESSION: No obstruction.  Slight increased echogenicity of the renal parenchyma consistent with renal medical disease.  Bilateral renal stones.  Note is made of prominent sludge in the gallbladder.  Original Report Authenticated By: Gwynn Burly, M.D.   Dg Bone Survey Met  07/04/2011  *RADIOLOGY REPORT*  Clinical Data: Pain, history of prostate cancer, concern for multiple myeloma  METASTATIC BONE SURVEY  Comparison: None.  Findings: No lytic lesion is seen in the calvarium.  Moderate multilevel degenerative changes of the cervical spine. No fracture or dislocation is seen.  Mild multilevel degenerative changes of the visualized thoracic spine.  Mild superior endplate compression deformity at T12.  Mild multilevel degenerative changes of the lumbar spine.  Mild superior endplate compression deformity at L3.  No lytic lesions are seen in the visualized appendicular skeleton and pelvis.  The visualized bony pelvis appears intact.  IMPRESSION: No lytic lesions are seen in the visualized axial or appendicular skeleton.  Mild superior endplate compression deformities at T12 and L3.  Per CMS PQRS reporting requirements (PQRS Measure 24): Given the patient's age of  greater than 50 and the fracture site (spine), the patient should be tested for osteoporosis using DXA, and the appropriate treatment considered based on the DXA results.  Original Report Authenticated By: Charline Bills, M.D.    Scheduled Meds:    . antiseptic oral rinse  15 mL Mouth Rinse BID  . furosemide  80 mg Oral BID  . pantoprazole  40 mg Oral Q1200  . potassium chloride  40 mEq Oral Once  . potassium chloride  40 mEq Oral Once  . DISCONTD: pantoprazole (PROTONIX) IV  40 mg Intravenous Q24H   Continuous Infusions:    . sodium chloride 100 mL/hr  at 07/06/11 1750   PRN Meds:.acetaminophen, acetaminophen, alum & mag hydroxide-simeth, ondansetron (ZOFRAN) IV, ondansetron, zolpidem  Assessment & Plan   Anemia associated with acute blood loss (07/04/2011): thrombocytopenia - ? MM -  Acute H&H drop Likely secondary to epistaxis, melena due to epistaxis (swallowed blood) in setting thrombocytopenia, plus bone marrow suppression.  Patient had received 2 units PRBC. HB after transfusion stable. Will continue to monitor HB trend. Switched to PO  PPI, stable H&H.   Concern for BM involvement with multiple myeloma. Workup during prior admission is as follow: PSA 0.22, reticulocyte count 0.8, haptoglobin: 157NML, D. Dimer 2.4, fibrinogen 414 NML, INR 1.16, PT 15, PTT 52, no Schistocytes. Haem following , D/W Dr Arline Asp he will see after BM Biopsy- I called IR this am, apparently order placed by [facility] for biopsy was wrong, I replaced the order BM Biopsy in am, monitor platelets, no bleed,  will transfuse if plts<20 or if bleeds, Dr Arline Asp agrees . Check CBC again in am.   Epistaxis (07/04/2011) Resolved- monitor H&H and plts.   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 [place] stable. Renal on board.IVF per renal.   Leukocytosis: Afebrile, CXR stable, no cough, stable UA. WBC little high, recheck 2 view CXR, afebrile.Likely from #1.  Hypokalemia -  replaced PO today, monitor K and Mag in am.   DVT Prophylaxis   SCDs  See all Orders from today for further details     [name] M.D 07/07/2011, 11:52 AM  [facility] Office  [phone]

## 2011-07-08 ENCOUNTER — Other Ambulatory Visit (HOSPITAL_COMMUNITY): Payer: BC Managed Care – PPO

## 2011-07-08 ENCOUNTER — Other Ambulatory Visit: Payer: Self-pay | Admitting: Interventional Radiology

## 2011-07-08 ENCOUNTER — Inpatient Hospital Stay (HOSPITAL_COMMUNITY): Payer: BC Managed Care – PPO

## 2011-07-08 LAB — CBC
HCT: 27.2 % — ABNORMAL LOW (ref 39.0–52.0)
Hemoglobin: 9.4 g/dL — ABNORMAL LOW (ref 13.0–17.0)
MCH: 30.1 pg (ref 26.0–34.0)
MCHC: 34.6 g/dL (ref 30.0–36.0)
MCV: 87.2 fL (ref 78.0–100.0)
RBC: 3.12 MIL/uL — ABNORMAL LOW (ref 4.22–5.81)

## 2011-07-08 LAB — BASIC METABOLIC PANEL
BUN: 28 mg/dL — ABNORMAL HIGH (ref 6–23)
CO2: 24 mEq/L (ref 19–32)
Calcium: 9.9 mg/dL (ref 8.4–10.5)
Glucose, Bld: 100 mg/dL — ABNORMAL HIGH (ref 70–99)
Sodium: 144 mEq/L (ref 135–145)

## 2011-07-08 LAB — IMMUNOFIXATION ELECTROPHORESIS
IgA: 38 mg/dL — ABNORMAL LOW (ref 68–379)
IgG (Immunoglobin G), Serum: 519 mg/dL — ABNORMAL LOW (ref 650–1600)
IgM, Serum: 12 mg/dL — ABNORMAL LOW (ref 41–251)

## 2011-07-08 MED ORDER — FENTANYL CITRATE 0.05 MG/ML IJ SOLN
INTRAMUSCULAR | Status: AC
  Filled 2011-07-08: qty 4

## 2011-07-08 MED ORDER — FENTANYL CITRATE 0.05 MG/ML IJ SOLN
INTRAMUSCULAR | Status: AC | PRN
  Administered 2011-07-08: 50 ug via INTRAVENOUS
  Administered 2011-07-08: 25 ug via INTRAVENOUS

## 2011-07-08 MED ORDER — MIDAZOLAM HCL 2 MG/2ML IJ SOLN
INTRAMUSCULAR | Status: AC
  Filled 2011-07-08: qty 4

## 2011-07-08 MED ORDER — MIDAZOLAM HCL 5 MG/5ML IJ SOLN
INTRAMUSCULAR | Status: AC | PRN
  Administered 2011-07-08: 1 mg via INTRAVENOUS

## 2011-07-08 NOTE — Progress Notes (Signed)
Patient ID: [name], male   DOB: Jun 17, 1946, 65 y.o.   MRN: [id number] Subjective: No events overnight. Patient denies chest pain, shortness of breath, abdominal pain. Had bowel movement and reports ambulating.  Objective:  Vital signs in last 24 hours:  Filed Vitals:   07/08/11 1058 07/08/11 1115 07/08/11 1145 07/08/11 2031  BP: 133/74 132/72 131/78 123/61  Pulse: 80 81 85 78  Temp: 97 F (36.1 C) 97 F (36.1 C) 97.7 F (36.5 C) 98.1 F (36.7 C)  TempSrc: Oral Oral Oral Oral  Resp: 20 18 18 18   Height:    6\' 4"  (1.93 m)  Weight:    92.352 kg (203 lb 9.6 oz)  SpO2: 97% 97% 97% 94%    Intake/Output from previous day:   Intake/Output Summary (Last 24 hours) at 07/08/11 2038 Last data filed at 07/08/11 0645  Gross per 24 hour  Intake 1843.33 ml  Output   2150 ml  Net -306.67 ml    Physical Exam: General: Alert, awake, oriented x3, in no acute distress. HEENT: No bruits, no goiter. Moist mucous membranes, no scleral icterus, no conjunctival pallor. Heart: Regular rate and rhythm, without murmurs, rubs, gallops. Lungs: Clear to auscultation bilaterally. No wheezing, no rhonchi, no rales.  Abdomen: Soft, nontender, nondistended, positive bowel sounds. Extremities: No clubbing cyanosis or edema,  positive pedal pulses. Neuro: Grossly intact, nonfocal.    Lab Results:  Basic Metabolic Panel:    Component Value Date/Time   NA 144 07/08/2011 0700   K 3.7 07/08/2011 0700   CL 109 07/08/2011 0700   CO2 24 07/08/2011 0700   BUN 28* 07/08/2011 0700   CREATININE 3.88* 07/08/2011 0700   GLUCOSE 100* 07/08/2011 0700   CALCIUM 9.9 07/08/2011 0700   CBC:    Component Value Date/Time   WBC 14.6* 07/08/2011 0700   HGB 9.4* 07/08/2011 0700   HCT 27.2* 07/08/2011 0700   PLT 24* 07/08/2011 0700   MCV 87.2 07/08/2011 0700   NEUTROABS 4.1 07/05/2011 0700   LYMPHSABS 4.5* 07/05/2011 0700   MONOABS 4.7* 07/05/2011 0700   EOSABS 0.3 07/05/2011 0700   BASOSABS 1.0*  07/05/2011 0700      Lab 07/08/11 0700 07/07/11 0634 07/06/11 0640 07/05/11 0924 07/05/11 0700 07/04/11 0945 07/03/11 2149  WBC 14.6* 15.3* 13.2* -- 14.6* -- 19.2*  HGB 9.4* 9.0* 8.8* 9.7* 8.8* -- --  HCT 27.2* 25.6* 24.7* 26.9* 24.4* -- --  PLT 24* 27* 31* -- 35* 44* --  MCV 87.2 86.8 85.5 -- 84.4 -- 87.6  MCH 30.1 30.5 30.4 -- 30.4 -- 32.1  MCHC 34.6 35.2 35.6 -- 36.1* -- 36.6*  RDW 15.3 15.2 15.2 -- 14.8 -- 13.3  LYMPHSABS -- -- -- -- 4.5* -- --  MONOABS -- -- -- -- 4.7* -- --  EOSABS -- -- -- -- 0.3 -- --  BASOSABS -- -- -- -- 1.0* -- --  BANDABS -- -- -- -- -- -- --    Lab 07/08/11 0700 07/07/11 0634 07/06/11 0640 07/05/11 1255 07/03/11 2210 07/03/11 2149  NA 144 144 143 142 139 --  K 3.7 3.1* 3.3* 2.7* 3.2* --  CL 109 112 113* 109 107 --  CO2 24 20 20 21  -- 22  GLUCOSE 100* 101* 91 104* 111* --  BUN 28* 30* 36* 43* 58* --  CREATININE 3.88* 3.64* 3.61* 3.59* 4.60* --  CALCIUM 9.9 10.0 9.8 10.3 -- 11.5*  MG 2.4 -- -- -- -- --    Lab 07/07/11  4098 07/04/11 0945 07/03/11 2149  INR 1.24 1.31 1.33  PROTIME -- -- --   Cardiac markers: No results found for this basename: CK:3,CKMB:3,TROPONINI:3,MYOGLOBIN:3 in the last 168 hours No components found with this basename: POCBNP:3 Recent Results (from the past 240 hour(s))  PATHOLOGIST SMEAR REVIEW     Status: Normal   Collection Time   07/05/11  7:00 AM      Component Value Range Status Comment   Tech Review MODERATE ANEMIA WITH NORMAL INDICES   Final     Studies/Results: Dg Chest 2 View  07/07/2011 IMPRESSION: Stable chronic lung disease.   Ct Biopsy  07/08/2011  .  IMPRESSION: CT guided left iliac bone marrow aspiration and core biopsy.   Medications: Scheduled Meds:   . antiseptic oral rinse  15 mL Mouth Rinse BID  . fentaNYL      . furosemide  80 mg Oral BID  . midazolam      . pantoprazole  40 mg Oral Q1200   Continuous Infusions:   . DISCONTD: sodium chloride 100 mL/hr at 07/08/11 0143   PRN  Meds:.acetaminophen, acetaminophen, alum & mag hydroxide-simeth, fentaNYL, midazolam, ondansetron (ZOFRAN) IV, ondansetron, zolpidem  Assessment/Plan:  Principal Problem:  *Anemia associated with acute blood loss - continue to monitor Hgb/Hct  Active Problems:  HYPERTENSION  Renal failure - likely secondary to multiple myeloma, awaiting results of bone marrow biopsy   GERD - continue protonix  Thrombocytopenia - likely due to bone marrow suppression; slowly trending down but no active bleed so we will defer transfusion for now   Epistaxis - resolved    LOS: 5 days   Tyleek Smick 07/08/2011, 8:38 PM

## 2011-07-08 NOTE — Procedures (Signed)
Successful lt iliac bm asp and core bx No comp Stable Path pending

## 2011-07-08 NOTE — Progress Notes (Signed)
S:appetite good No sob still with back pain O:BP 151/80  Pulse 92  Temp(Src) 98 F (36.7 C) (Oral)  Resp 15  Ht 6\' 4"  (1.93 m)  Wt 52.254 kg (115 lb 3.2 oz)  BMI 14.02 kg/m2  SpO2 100%  Intake/Output Summary (Last 24 hours) at 07/08/11 1032 Last data filed at 07/08/11 0645  Gross per 24 hour  Intake 3123.33 ml  Output   3175 ml  Net -51.67 ml   Weight change: -44.246 kg (-97 lb 8.7 oz) ZOX:WRUEA and alert [facility]S:rrr Resp:clear Abd:+BS NTND Ext:no edema NEURO:Ox3 no asterixis    . antiseptic oral rinse  15 mL Mouth Rinse BID  . fentaNYL      . furosemide  80 mg Oral BID  . midazolam      . pantoprazole  40 mg Oral Q1200  . potassium chloride  40 mEq Oral Once   Dg Chest 2 View  07/07/2011  *RADIOLOGY REPORT*  Clinical Data: Cough.  CHEST - 2 VIEW  Comparison: 07/04/2011  Findings: Stable chronic lung disease.  No infiltrate, edema or pleural fluid identified.  Stable appearance of the bony thorax. The heart size is normal.  IMPRESSION: Stable chronic lung disease.  Original Report Authenticated By: Reola Calkins, M.D.   Nm Bone Scan Whole Body  07/06/2011  *RADIOLOGY REPORT*  Clinical Data: History of prostate cancer.  Back pain and hypercalcemia.  Compression fractures of T12 and L3.  NUCLEAR MEDICINE WHOLE BODY BONE SCINTIGRAPHY  Technique:  Whole body anterior and posterior images were obtained approximately 3 hours after intravenous injection of radiopharmaceutical.  Radiopharmaceutical: 25.3 mCi of technetium MDP  Comparison: Bone survey dated 07/04/2011  Findings: The scan demonstrates increased activity at the posterior lateral aspect of the right eighth rib which correlates with a healing or healed fracture visible on the chest x-ray of 07/04/2011.  There are no areas of abnormal activity in the spine. Specifically, no evidence of abnormal activity in the T12 L3 vertebra.  No findings suggestive of metastatic prostate cancer.  Free technetium is seen in the stomach due  to imperfect tag of the agent.  IMPRESSION: Healing fracture of the posterior lateral aspect of the right eighth rib.  No other significant abnormalities.   Specifically, no abnormal activity in T12 or L3. Possibility of myeloma should be considered since the fractures are not visible on 10/24/2008.  Original Report Authenticated By: Gwynn Burly, M.D.   BMET    Component Value Date/Time   NA 144 07/08/2011 0700   K 3.7 07/08/2011 0700   CL 109 07/08/2011 0700   CO2 24 07/08/2011 0700   GLUCOSE 100* 07/08/2011 0700   BUN 28* 07/08/2011 0700   CREATININE 3.88* 07/08/2011 0700   CALCIUM 9.9 07/08/2011 0700   GFRNONAA 15* 07/08/2011 0700   GFRAA 17* 07/08/2011 0700   CBC    Component Value Date/Time   WBC 14.6* 07/08/2011 0700   RBC 3.12* 07/08/2011 0700   HGB 9.4* 07/08/2011 0700   HCT 27.2* 07/08/2011 0700   PLT 24* 07/08/2011 0700   MCV 87.2 07/08/2011 0700   MCH 30.1 07/08/2011 0700   MCHC 34.6 07/08/2011 0700   RDW 15.3 07/08/2011 0700   LYMPHSABS 4.5* 07/05/2011 0700   MONOABS 4.7* 07/05/2011 0700   EOSABS 0.3 07/05/2011 0700   BASOSABS 1.0* 07/05/2011 0700     Assessment:  1. CKD prob sec to MM, Spep shows two Mspikes 2. Hypercalcemia, improved 3. thrombocytopenia   Plan Await BM results, if  has MM the sooner treatment can start the better from the renal standpoint DC IV fluids and follow calcium Cont with lasix   Rosaleen Mazer T

## 2011-07-09 ENCOUNTER — Encounter (HOSPITAL_COMMUNITY): Payer: Self-pay | Admitting: Oncology

## 2011-07-09 DIAGNOSIS — C9001 Multiple myeloma in remission: Secondary | ICD-10-CM | POA: Insufficient documentation

## 2011-07-09 DIAGNOSIS — D649 Anemia, unspecified: Secondary | ICD-10-CM

## 2011-07-09 DIAGNOSIS — C61 Malignant neoplasm of prostate: Secondary | ICD-10-CM

## 2011-07-09 DIAGNOSIS — N179 Acute kidney failure, unspecified: Secondary | ICD-10-CM

## 2011-07-09 DIAGNOSIS — D696 Thrombocytopenia, unspecified: Secondary | ICD-10-CM

## 2011-07-09 DIAGNOSIS — C9002 Multiple myeloma in relapse: Secondary | ICD-10-CM | POA: Insufficient documentation

## 2011-07-09 LAB — RENAL FUNCTION PANEL
Albumin: 3.5 g/dL (ref 3.5–5.2)
BUN: 27 mg/dL — ABNORMAL HIGH (ref 6–23)
Calcium: 9.8 mg/dL (ref 8.4–10.5)
Creatinine, Ser: 3.7 mg/dL — ABNORMAL HIGH (ref 0.50–1.35)
Phosphorus: 4.2 mg/dL (ref 2.3–4.6)
Potassium: 3.1 mEq/L — ABNORMAL LOW (ref 3.5–5.1)

## 2011-07-09 LAB — CBC
HCT: 27.6 % — ABNORMAL LOW (ref 39.0–52.0)
MCHC: 34.4 g/dL (ref 30.0–36.0)
RDW: 15.1 % (ref 11.5–15.5)

## 2011-07-09 LAB — PARATHYROID HORMONE, INTACT (NO CA): PTH: 34.2 pg/mL (ref 14.0–72.0)

## 2011-07-09 MED ORDER — DEXAMETHASONE SODIUM PHOSPHATE 4 MG/ML IJ SOLN
40.0000 mg | INTRAMUSCULAR | Status: AC
  Administered 2011-07-10: 40 mg via INTRAVENOUS
  Filled 2011-07-09 (×3): qty 10

## 2011-07-09 MED ORDER — BORTEZOMIB CHEMO SQ INJECTION 3.5 MG (2.5MG/ML)
1.3000 mg/m2 | Freq: Once | INTRAMUSCULAR | Status: AC
  Administered 2011-07-10: 2.75 mg via SUBCUTANEOUS
  Filled 2011-07-09: qty 1.1

## 2011-07-09 MED ORDER — POTASSIUM CHLORIDE CRYS ER 20 MEQ PO TBCR
20.0000 meq | EXTENDED_RELEASE_TABLET | Freq: Two times a day (BID) | ORAL | Status: DC
  Administered 2011-07-09 – 2011-07-11 (×5): 20 meq via ORAL
  Filled 2011-07-09 (×6): qty 1

## 2011-07-09 MED ORDER — ACYCLOVIR 200 MG PO CAPS
800.0000 mg | ORAL_CAPSULE | Freq: Two times a day (BID) | ORAL | Status: DC
  Filled 2011-07-09: qty 4

## 2011-07-09 MED ORDER — ALLOPURINOL 100 MG PO TABS
100.0000 mg | ORAL_TABLET | Freq: Every day | ORAL | Status: DC
  Administered 2011-07-09 – 2011-07-13 (×5): 100 mg via ORAL
  Filled 2011-07-09 (×5): qty 1

## 2011-07-09 MED ORDER — SODIUM CHLORIDE 0.9 % IV SOLN
100.0000 mg/m2 | Freq: Once | INTRAVENOUS | Status: AC
  Administered 2011-07-10: 220 mg via INTRAVENOUS
  Filled 2011-07-09: qty 11

## 2011-07-09 MED ORDER — ACYCLOVIR 800 MG PO TABS
800.0000 mg | ORAL_TABLET | Freq: Two times a day (BID) | ORAL | Status: DC
  Administered 2011-07-09 – 2011-07-13 (×8): 800 mg via ORAL
  Filled 2011-07-09 (×9): qty 1

## 2011-07-09 MED ORDER — POTASSIUM CHLORIDE 20 MEQ PO PACK: 20.0000 meq | PACK | Freq: Two times a day (BID) | ORAL | Status: DC

## 2011-07-09 NOTE — Progress Notes (Signed)
S:appetite good No sob still with back pain  Wants to go home O:BP 156/83  Pulse 91  Temp(Src) 98 F (36.7 C) (Oral)  Resp 20  Ht 6\' 4"  (1.93 m)  Wt 92.352 kg (203 lb 9.6 oz)  BMI 24.78 kg/m2  SpO2 98%  Intake/Output Summary (Last 24 hours) at 07/09/11 0930 Last data filed at 07/09/11 0700  Gross per 24 hour  Intake    360 ml  Output   1100 ml  Net   -740 ml   Weight change: 40.098 kg (88 lb 6.4 oz) ZOX:WRUEA and alert [facility]S:rrr Resp:clear Abd:+BS NTND Ext:no edema NEURO:Ox3 no asterixis    . antiseptic oral rinse  15 mL Mouth Rinse BID  . fentaNYL      . furosemide  80 mg Oral BID  . midazolam      . pantoprazole  40 mg Oral Q1200   Dg Chest 2 View  07/07/2011  *RADIOLOGY REPORT*  Clinical Data: Cough.  CHEST - 2 VIEW  Comparison: 07/04/2011  Findings: Stable chronic lung disease.  No infiltrate, edema or pleural fluid identified.  Stable appearance of the bony thorax. The heart size is normal.  IMPRESSION: Stable chronic lung disease.  Original Report Authenticated By: Reola Calkins, M.D.   Ct Biopsy  07/08/2011  *RADIOLOGY REPORT*  Clinical Data:  Thrombocytopenia, anemia, history of prostate cancer, possible multiple myeloma  CT GUIDED LEFT ILIAC BONE MARROW ASPIRATION AND CORE BIOPSY  Date:  07/08/2011 09:19:00  Radiologist:  M. Ruel Favors, M.D.  Medications:  75 micrograms Fentanyl, 1 mg Versed  Guidance:  CT  Sedation time:  15 minutes  Contrast volume:   none.  Complications:  No immediate.  PROCEDURE/FINDINGS:  Informed consent was obtained from the patient following explanation of the procedure, risks, benefits and alternatives. The patient understands, agrees and consents for the procedure. All questions were addressed.  A time out was performed.  The patient was positioned prone and noncontrast localization CT was performed of the pelvis to demonstrate the iliac marrow spaces.  Maximal barrier sterile technique utilized including caps, mask, sterile gowns,  sterile gloves, large sterile drape, hand hygiene, and betadine prep.  Under sterile conditions and local anesthesia, an 11 gauge coaxial bone biopsy needle was advanced into the left iliac marrow space. Needle position was confirmed with CT imaging. Initially, bone marrow aspiration was performed. Next, the 11 gauge outer cannula was utilized to obtain a left iliac bone marrow core biopsy. Needle was removed. Hemostasis was obtained with compression. The patient tolerated the procedure well. Samples were prepared with the cytotechnologist. No immediate complications.  IMPRESSION: CT guided left iliac bone marrow aspiration and core biopsy.  Original Report Authenticated By: Judie Petit. Ruel Favors, M.D.   BMET    Component Value Date/Time   NA 140 07/09/2011 0608   K 3.1* 07/09/2011 0608   CL 104 07/09/2011 0608   CO2 22 07/09/2011 0608   GLUCOSE 105* 07/09/2011 0608   BUN 27* 07/09/2011 0608   CREATININE 3.70* 07/09/2011 0608   CALCIUM 9.8 07/09/2011 0608   GFRNONAA 16* 07/09/2011 0608   GFRAA 18* 07/09/2011 0608   CBC    Component Value Date/Time   WBC 13.3* 07/09/2011 0608   RBC 3.18* 07/09/2011 0608   HGB 9.5* 07/09/2011 0608   HCT 27.6* 07/09/2011 0608   PLT 20* 07/09/2011 0608   MCV 86.8 07/09/2011 0608   MCH 29.9 07/09/2011 0608   MCHC 34.4 07/09/2011 0608   RDW 15.1 07/09/2011  1610   LYMPHSABS 4.5* 07/05/2011 0700   MONOABS 4.7* 07/05/2011 0700   EOSABS 0.3 07/05/2011 0700   BASOSABS 1.0* 07/05/2011 0700     Assessment:  1. CKD prob sec to MM, Spep shows two Mspikes, renal fx stable 2. Hypercalcemia, improved 3. Thrombocytopenia 4. hypokalemia   Plan Await BM results, if has MM the sooner treatment can start the better from the renal standpoint Replace K Cont with lasix   [name] T

## 2011-07-09 NOTE — Progress Notes (Signed)
Utilization Review Completed.[name] T12/13/2012   

## 2011-07-09 NOTE — Progress Notes (Signed)
Subjective: Events since 12/9 noted. Pt s/p BM biopsy 12/11, results pending, including flow cytometry and FISH studies. Bone scan shows healing fracture of the posterior lateral aspect of the right 8th rib. No other significant abnormalities. Platelets continue to trend down despite no acute bleeding. No platelet transfusion to date. Anemia stable post transfusion 12/8. Objective: Vital signs in last 24 hours: BP 156/83  Pulse 91  Temp(Src) 98 F (36.7 C) (Oral)  Resp 20  Ht 6\' 4"  (1.93 m)  Wt 203 lb 9.6 oz (92.352 kg)  BMI 24.78 kg/m2  SpO2 98%  Physical Exam: 65 y.o.  in no acute distress  A. and O. x3 HEENT: Sclera anicteric. Oral cavity without thrush or lesions. Neck supple.no cervical or supraclavicular adenopathy  Lungs: CTA. No wheezing, rhonchi or rales. No axillary masses. CV regular rate and rhythm normal S1-S2, no murmur , rubs or gallops Abdomen soft nontender , bowel sounds x4  no hepatosplenomegaly GU/rectal: deferred. Extremities: no clubbing cyanosis . No edema. No petechial rash Neurologic: non focal    Lab Results: Labs:  CBC   Lab 07/09/11 0608 07/08/11 0700 07/07/11 0634 07/06/11 0640 07/05/11 0924 07/05/11 0700  WBC 13.3* 14.6* 15.3* 13.2* -- 14.6*  HGB 9.5* 9.4* 9.0* 8.8* 9.7* --  HCT 27.6* 27.2* 25.6* 24.7* 26.9* --  PLT 20* 24* 27* 31* -- 35*  MCV 86.8 87.2 86.8 85.5 -- 84.4  MCH 29.9 30.1 30.5 30.4 -- 30.4  MCHC 34.4 34.6 35.2 35.6 -- 36.1*  RDW 15.1 15.3 15.2 15.2 -- 14.8  LYMPHSABS -- -- -- -- -- 4.5*  MONOABS -- -- -- -- -- 4.7*  EOSABS -- -- -- -- -- 0.3  BASOSABS -- -- -- -- -- 1.0*  BANDABS -- -- -- -- -- --   Recent Results (from the past 240 hour(s))   PATHOLOGIST SMEAR REVIEW Status: Normal    Collection Time    07/05/11 7:00 AM   Component  Value  Range  Status  Comment    Tech Review  MODERATE ANEMIA WITH NORMAL INDICES   Final     Component Value Tech Review MODERATE ANEMIA WITH NORMAL INDICES Comments: AND OCCASIONAL NRBC's.  MILD ABSOLUTE LYMPHOCYTOSIS AND MONOCYTOSIS. MARKEDTHROMBOCYTOPENIA. Reviewed by Coralyn Pear, M.D. 07/06/11   QIg: G 539, A 35, M not available  CMP    Lab 07/08/11 0700 07/07/11 0634 07/06/11 0640 07/05/11 1255 07/04/11 0901 07/03/11 2210 07/03/11 2149  NA 144 144 143 142 -- 139 --  K 3.7 3.1* 3.3* 2.7* -- 3.2* --  CL 109 112 113* 109 -- 107 --  CO2 24 20 20 21  -- -- 22  GLUCOSE 100* 101* 91 104* -- 111* --  BUN 28* 30* 36* 43* -- 58* --  CREATININE 3.88* 3.64* 3.61* 3.59* -- 4.60* --  CALCIUM 9.9 10.0 9.8 10.3 -- -- 11.5*  MG 2.4 -- -- -- -- -- --  AST -- -- -- -- 27 -- 29  ALT -- -- -- -- 15 -- 16  ALKPHOS -- -- -- -- 194* -- 201*  BILITOT -- -- -- -- 1.0 -- 0.9        Component Value Date/Time   BILITOT 1.0 07/04/2011 0901   BILIDIR 0.2 07/04/2011 0901   IBILI 0.8 07/04/2011 0901     Anemia panel: No results found for this basename: VITAMINB12:2,FOLATE:2,FERRITIN:2,TIBC:2,IRON:2,RETICCTPCT:2 in the last 72 hours  No results found for this basename: TSH,T4TOTAL,FREET3,T3FREE,THYROIDAB in the last 72 hours   No results found for  this basename: esrsedrate      Lab 07/07/11 0904 07/04/11 0945 07/03/11 2149  INR 1.24 1.31 1.33  PROTIME -- -- --    No results found for this basename: DDIMER:2 in the last 72 hours  Imaging Studies: Study Result     *RADIOLOGY REPORT*  Clinical Data: History of prostate cancer. Back pain and hypercalcemia. Compression fractures of T12 and L3.  NUCLEAR MEDICINE WHOLE BODY BONE SCINTIGRAPHY  Technique: Whole body anterior and posterior images were obtained approximately 3 hours after intravenous injection of radiopharmaceutical. Radiopharmaceutical: 25.3 mCi of technetium MDP Comparison: Bone survey dated 07/04/2011  Findings: The scan demonstrates increased activity at the posterior lateral aspect of the right eighth rib which correlates with a healing or healed fracture visible on the chest x-ray of 07/04/2011. There are no  areas of abnormal activity in the spine. Specifically, no evidence of abnormal activity in the T12 L3 vertebra. No findings suggestive of metastatic prostate cancer. Free technetium is seen in the stomach due to imperfect tag of the agent.  IMPRESSION:  Healing fracture of the posterior lateral aspect of the right eighth rib. No other significant abnormalities. Specifically, no abnormal activity in T12 or L3. Possibility of myeloma should be considered since the fractures are not visible on 10/24/2008. Original Report Authenticated By: Gwynn Burly, M.D.      Dg Chest 2 View  07/07/2011  *RADIOLOGY REPORT*  Clinical Data: Cough.  CHEST - 2 VIEW  Comparison: 07/04/2011  Findings: Stable chronic lung disease.  No infiltrate, edema or pleural fluid identified.  Stable appearance of the bony thorax. The heart size is normal.  IMPRESSION: Stable chronic lung disease.  Original Report Authenticated By: Reola Calkins, M.D.   Ct Biopsy  07/08/2011  *RADIOLOGY REPORT*  Clinical Data:  Thrombocytopenia, anemia, history of prostate cancer, possible multiple myeloma  CT GUIDED LEFT ILIAC BONE MARROW ASPIRATION AND CORE BIOPSY  Date:  07/08/2011 09:19:00  Radiologist:  M. Ruel Favors, M.D.  Medications:  75 micrograms Fentanyl, 1 mg Versed  Guidance:  CT  Sedation time:  15 minutes  Contrast volume:   none.  Complications:  No immediate.  PROCEDURE/FINDINGS:  Informed consent was obtained from the patient following explanation of the procedure, risks, benefits and alternatives. The patient understands, agrees and consents for the procedure. All questions were addressed.  A time out was performed.  The patient was positioned prone and noncontrast localization CT was performed of the pelvis to demonstrate the iliac marrow spaces.  Maximal barrier sterile technique utilized including caps, mask, sterile gowns, sterile gloves, large sterile drape, hand hygiene, and betadine prep.  Under sterile conditions and  local anesthesia, an 11 gauge coaxial bone biopsy needle was advanced into the left iliac marrow space. Needle position was confirmed with CT imaging. Initially, bone marrow aspiration was performed. Next, the 11 gauge outer cannula was utilized to obtain a left iliac bone marrow core biopsy. Needle was removed. Hemostasis was obtained with compression. The patient tolerated the procedure well. Samples were prepared with the cytotechnologist. No immediate complications.  IMPRESSION: CT guided left iliac bone marrow aspiration and core biopsy.  Original Report Authenticated By: Judie Petit. Ruel Favors, M.D.   Assessment/Plan:  Principal Problem:  *Anemia associated with acute blood loss/Thrombocytopenia: Pt s/p Tx 2 units 12/8 Continue to transfuse with packed RBCs and platelets as indicated (if less than 20k or if active bleeding). vitamin K to see if coags do not improve (last dose 12/11) Dr. Arline Asp to follow  once results from [facility] Bx become available Pager # [phone] Prostate Cancer: as per Dr. Laverle Patter Epistaxis:resolved Renal Failure: as per Nephrology service. Other IM problems as per admitting team.   LOS: 6 days   [facility] E 07/09/2011, 6:46 AM   Events of the past several days reviewed.  Case discussed with Dr. Elisabeth Pigeon, hospitalist.   I have spoken to Dr. Guerry Bruin from pathology.  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  Special studies are pending and should be out by tomorrow.  Pt seems to be clinically stable and OOB with walker.  He has not required any additional transfusions since admission but may need platelets soon, certainly prior to discharge.  Renal function seems to have leveled off at a Cr of 3.7.  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  Serum immunoglobulins are depressed.  SIFE is still pending.  Lengthy discussion with pt and his significant other, Patty Simmons.  They understand the side  effects, toxicities and potential complications of chemotherapy in this setting but are in agreement will proceeding with treatment.  To check uric acid in am.  Will need to monitor CBCs and lytes daily after Rx.  Transfuse as indicated.  Have started pt on allopurinol and acyclovir prophylactically.  To proceed cautiously with modified doses of decadron x 3d IV, cytoxan IV and velcade subq tomorrow.  If pt does well and clinically stable over the weekend, he may be ready for d/c Monday.  He will need to be followed very closely, perhaps CBCs MWF, by me as an outpt with transfusions as indicated.  Call if any questions or concerns.  I can be reached on pager [phone].   Samul Dada

## 2011-07-09 NOTE — Progress Notes (Signed)
Patient ID: [name], male   DOB: 09-May-1946, 65 y.o.   MRN: [id number] Subjective: No events overnight. Patient denies chest pain, shortness of breath, abdominal pain. Patient reports ambulating.  Objective:  Vital signs in last 24 hours:  Filed Vitals:   07/08/11 2031 07/09/11 0215 07/09/11 0447 07/09/11 1000  BP: 123/61 124/67 156/83 147/76  Pulse: 78 79 91 87  Temp: 98.1 F (36.7 C) 98.7 F (37.1 C) 98 F (36.7 C) 98.7 F (37.1 C)  TempSrc: Oral Oral Oral Oral  Resp: 18 18 20 20   Height: 6\' 4"  (1.93 m)     Weight: 92.352 kg (203 lb 9.6 oz)     SpO2: 94% 97% 98% 96%    Intake/Output from previous day:   Intake/Output Summary (Last 24 hours) at 07/09/11 1554 Last data filed at 07/09/11 0900  Gross per 24 hour  Intake    600 ml  Output   1100 ml  Net   -500 ml    Physical Exam: General: Alert, awake, oriented x3, in no acute distress. HEENT: No bruits, no goiter. Moist mucous membranes, no scleral icterus, no conjunctival pallor. Heart: Regular rate and rhythm, without murmurs, rubs, gallops. Lungs: Clear to auscultation bilaterally. No wheezing, no rhonchi, no rales.  Abdomen: Soft, nontender, nondistended, positive bowel sounds. Extremities: No clubbing cyanosis or edema,  positive pedal pulses. Neuro: Grossly intact, nonfocal.    Lab Results:  Basic Metabolic Panel:    Component Value Date/Time   NA 140 07/09/2011 0608   K 3.1* 07/09/2011 0608   CL 104 07/09/2011 0608   CO2 22 07/09/2011 0608   BUN 27* 07/09/2011 0608   CREATININE 3.70* 07/09/2011 0608   GLUCOSE 105* 07/09/2011 0608   CALCIUM 9.8 07/09/2011 0608   CBC:    Component Value Date/Time   WBC 13.3* 07/09/2011 0608   HGB 9.5* 07/09/2011 0608   HCT 27.6* 07/09/2011 0608   PLT 20* 07/09/2011 0608   MCV 86.8 07/09/2011 0608   NEUTROABS 4.1 07/05/2011 0700   LYMPHSABS 4.5* 07/05/2011 0700   MONOABS 4.7* 07/05/2011 0700   EOSABS 0.3 07/05/2011 0700   BASOSABS 1.0* 07/05/2011 0700       Lab 07/09/11 0608 07/08/11 0700 07/07/11 0634 07/06/11 0640 07/05/11 0924 07/05/11 0700  WBC 13.3* 14.6* 15.3* 13.2* -- 14.6*  HGB 9.5* 9.4* 9.0* 8.8* 9.7* --  HCT 27.6* 27.2* 25.6* 24.7* 26.9* --  PLT 20* 24* 27* 31* -- 35*  MCV 86.8 87.2 86.8 85.5 -- 84.4  MCH 29.9 30.1 30.5 30.4 -- 30.4  MCHC 34.4 34.6 35.2 35.6 -- 36.1*  RDW 15.1 15.3 15.2 15.2 -- 14.8  LYMPHSABS -- -- -- -- -- 4.5*  MONOABS -- -- -- -- -- 4.7*  EOSABS -- -- -- -- -- 0.3  BASOSABS -- -- -- -- -- 1.0*  BANDABS -- -- -- -- -- --    Lab 07/09/11 0608 07/08/11 0700 07/07/11 0634 07/06/11 0640 07/05/11 1255  NA 140 144 144 143 142  K 3.1* 3.7 3.1* 3.3* 2.7*  CL 104 109 112 113* 109  CO2 22 24 20 20 21   GLUCOSE 105* 100* 101* 91 104*  BUN 27* 28* 30* 36* 43*  CREATININE 3.70* 3.88* 3.64* 3.61* 3.59*  CALCIUM 9.8 9.9 10.0 9.8 10.3  MG -- 2.4 -- -- --    Lab 07/07/11 0904 07/04/11 0945 07/03/11 2149  INR 1.24 1.31 1.33  PROTIME -- -- --   Cardiac markers: No results found for this  basename: CK:3,CKMB:3,TROPONINI:3,MYOGLOBIN:3 in the last 168 hours No components found with this basename: POCBNP:3 Recent Results (from the past 240 hour(s))  PATHOLOGIST SMEAR REVIEW     Status: Normal   Collection Time   07/05/11  7:00 AM      Component Value Range Status Comment   Tech Review MODERATE ANEMIA WITH NORMAL INDICES   Final     Studies/Results: Ct Biopsy  07/08/2011 IMPRESSION: CT guided left iliac bone marrow aspiration and core biopsy.     Medications: Scheduled Meds:   . antiseptic oral rinse  15 mL Mouth Rinse BID  . fentaNYL      . furosemide  80 mg Oral BID  . midazolam      . pantoprazole  40 mg Oral Q1200  . potassium chloride  20 mEq Oral BID  . DISCONTD: potassium chloride  20 mEq Oral BID   Continuous Infusions:  PRN Meds:.acetaminophen, acetaminophen, alum & mag hydroxide-simeth, ondansetron (ZOFRAN) IV, ondansetron, zolpidem  Assessment/Plan:  Principal Problem:  *Anemia  associated with acute blood loss - hemoglobin and hematocrit are stable; at present no need for transfusion  Active Problems: Renal failure - possible etiology includes multiple myeloma; awaiting result of bone marrow biopsy; as per renal continue lasix and metolazone for now    Thrombocytopenia - slowly trending down, 20 a at present; as per oncology we will transfuse if platelets<20 or if active bleed.   Epistaxis - resolved at present.  Hypokalemia - replete potassium   LOS: 6 days   DEVINE, ALMA 07/09/2011, 3:54 PM

## 2011-07-10 LAB — RENAL FUNCTION PANEL
BUN: 30 mg/dL — ABNORMAL HIGH (ref 6–23)
Calcium: 9.7 mg/dL (ref 8.4–10.5)
Creatinine, Ser: 4.03 mg/dL — ABNORMAL HIGH (ref 0.50–1.35)
Glucose, Bld: 106 mg/dL — ABNORMAL HIGH (ref 70–99)
Phosphorus: 4.5 mg/dL (ref 2.3–4.6)
Sodium: 141 mEq/L (ref 135–145)

## 2011-07-10 LAB — CBC
HCT: 28.7 % — ABNORMAL LOW (ref 39.0–52.0)
MCH: 30.2 pg (ref 26.0–34.0)
MCHC: 34.8 g/dL (ref 30.0–36.0)
MCV: 86.7 fL (ref 78.0–100.0)
RDW: 14.9 % (ref 11.5–15.5)

## 2011-07-10 MED ORDER — SODIUM CHLORIDE 0.9 % IV SOLN
16.0000 mg | Freq: Once | INTRAVENOUS | Status: AC
  Administered 2011-07-10: 16 mg via INTRAVENOUS
  Filled 2011-07-10 (×2): qty 8

## 2011-07-10 MED ORDER — DEXAMETHASONE SODIUM PHOSPHATE 4 MG/ML IJ SOLN
40.0000 mg | INTRAMUSCULAR | Status: AC
  Administered 2011-07-10 – 2011-07-12 (×3): 40 mg via INTRAVENOUS
  Filled 2011-07-10 (×3): qty 10

## 2011-07-10 MED ORDER — BORTEZOMIB CHEMO SQ INJECTION 3.5 MG (2.5MG/ML): 1.3000 mg/m2 | Freq: Once | INTRAMUSCULAR | Status: DC

## 2011-07-10 NOTE — Progress Notes (Signed)
S:appetite good No sob still with back pain  Wants to go home O:BP 151/77  Pulse 88  Temp(Src) 98.2 F (36.8 C) (Oral)  Resp 18  Ht 6\' 4"  (1.93 m)  Wt 89.9 kg (198 lb 3.1 oz)  BMI 24.12 kg/m2  SpO2 95%  Intake/Output Summary (Last 24 hours) at 07/10/11 1038 Last data filed at 07/10/11 0650  Gross per 24 hour  Intake    480 ml  Output    300 ml  Net    180 ml   Weight change: -2.452 kg (-5 lb 6.5 oz) ZOX:WRUEA and alert [facility]:rrr Resp:clear Abd:+BS NTND Ext:no edema NEURO:Ox3 no asterixis    . acyclovir  800 mg Oral BID  . allopurinol  100 mg Oral Daily  . antiseptic oral rinse  15 mL Mouth Rinse BID  . bortezomib SQ  1.3 mg/m2 (Ideal) Subcutaneous Once  . cyclophosphamide  100 mg/m2 Intravenous Once  . dexamethasone (DECADRON) IVPB  40 mg Intravenous Q24H  . dexamethasone  40 mg Intravenous Q24H  . furosemide  80 mg Oral BID  . ondansetron (ZOFRAN) IV  16 mg Intravenous Once  . pantoprazole  40 mg Oral Q1200  . potassium chloride  20 mEq Oral BID  . DISCONTD: acyclovir  800 mg Oral BID  . DISCONTD: bortezomib SQ  1.3 mg/m2 Subcutaneous Once   No results found. BMET    Component Value Date/Time   NA 141 07/10/2011 0617   K 3.3* 07/10/2011 0617   CL 104 07/10/2011 0617   CO2 22 07/10/2011 0617   GLUCOSE 106* 07/10/2011 0617   BUN 30* 07/10/2011 0617   CREATININE 4.03* 07/10/2011 0617   CALCIUM 9.7 07/10/2011 0617   GFRNONAA 14* 07/10/2011 0617   GFRAA 17* 07/10/2011 0617   CBC    Component Value Date/Time   WBC 15.5* 07/10/2011 0617   RBC 3.31* 07/10/2011 0617   HGB 10.0* 07/10/2011 0617   HCT 28.7* 07/10/2011 0617   PLT 23* 07/10/2011 0617   MCV 86.7 07/10/2011 0617   MCH 30.2 07/10/2011 0617   MCHC 34.8 07/10/2011 0617   RDW 14.9 07/10/2011 0617   LYMPHSABS 4.5* 07/05/2011 0700   MONOABS 4.7* 07/05/2011 0700   EOSABS 0.3 07/05/2011 0700   BASOSABS 1.0* 07/05/2011 0700     Assessment:  1. 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 Hypercalcemia,  improved 3. Thrombocytopenia 4. hypokalemia   Plan To get started on chemo today Discussed with pt that treatment of his MM is the treatment for his kidneys but kidney fx could still worsen and dialysis might be needed.  He understands.  Will show him the dialysis videos just to get some education for now   Ester Mabe T

## 2011-07-10 NOTE — Progress Notes (Signed)
Patient ID: [name], male   DOB: 07/09/1946, 65 y.o.   MRN: [id number] Subjective: No events overnight. Patient denies chest pain, shortness of breath, abdominal pain. Had bowel movement and reports ambulating.  Objective:  Vital signs in last 24 hours:  Filed Vitals:   07/09/11 2140 07/10/11 0500 07/10/11 1000 07/10/11 1400  BP: 123/80 151/77 125/79 145/70  Pulse: 90 88 105 92  Temp: 97.5 F (36.4 C) 98.2 F (36.8 C) 97.6 F (36.4 C) 97.2 F (36.2 C)  TempSrc: Oral Oral Oral Oral  Resp: 20 18 18 16   Height:      Weight: 89.9 kg (198 lb 3.1 oz)     SpO2: 96% 95% 94% 94%    Intake/Output from previous day:   Intake/Output Summary (Last 24 hours) at 07/10/11 1739 Last data filed at 07/10/11 1300  Gross per 24 hour  Intake    480 ml  Output      0 ml  Net    480 ml    Physical Exam: General: Alert, awake, oriented x3, in no acute distress. HEENT: No bruits, no goiter. Moist mucous membranes, no scleral icterus, no conjunctival pallor. Heart: Regular rate and rhythm, without murmurs, rubs, gallops. Lungs: Clear to auscultation bilaterally. No wheezing, no rhonchi, no rales.  Abdomen: Soft, nontender, nondistended, positive bowel sounds. Extremities: No clubbing cyanosis or edema,  positive pedal pulses. Neuro: Grossly intact, nonfocal.    Lab Results:  Basic Metabolic Panel:    Component Value Date/Time   NA 141 07/10/2011 0617   K 3.3* 07/10/2011 0617   CL 104 07/10/2011 0617   CO2 22 07/10/2011 0617   BUN 30* 07/10/2011 0617   CREATININE 4.03* 07/10/2011 0617   GLUCOSE 106* 07/10/2011 0617   CALCIUM 9.7 07/10/2011 0617   CBC:    Component Value Date/Time   WBC 15.5* 07/10/2011 0617   HGB 10.0* 07/10/2011 0617   HCT 28.7* 07/10/2011 0617   PLT 23* 07/10/2011 0617   MCV 86.7 07/10/2011 0617   NEUTROABS 4.1 07/05/2011 0700   LYMPHSABS 4.5* 07/05/2011 0700   MONOABS 4.7* 07/05/2011 0700   EOSABS 0.3 07/05/2011 0700   BASOSABS 1.0* 07/05/2011 0700       Lab 07/10/11 0617 07/09/11 0608 07/08/11 0700 07/07/11 0634 07/06/11 0640 07/05/11 0700  WBC 15.5* 13.3* 14.6* 15.3* 13.2* --  HGB 10.0* 9.5* 9.4* 9.0* 8.8* --  HCT 28.7* 27.6* 27.2* 25.6* 24.7* --  PLT 23* 20* 24* 27* 31* --  MCV 86.7 86.8 87.2 86.8 85.5 --  MCH 30.2 29.9 30.1 30.5 30.4 --  MCHC 34.8 34.4 34.6 35.2 35.6 --  RDW 14.9 15.1 15.3 15.2 15.2 --  LYMPHSABS -- -- -- -- -- 4.5*  MONOABS -- -- -- -- -- 4.7*  EOSABS -- -- -- -- -- 0.3  BASOSABS -- -- -- -- -- 1.0*  BANDABS -- -- -- -- -- --    Lab 07/10/11 0617 07/09/11 0608 07/08/11 0700 07/07/11 0634 07/06/11 0640  NA 141 140 144 144 143  K 3.3* 3.1* 3.7 3.1* 3.3*  CL 104 104 109 112 113*  CO2 22 22 24 20 20   GLUCOSE 106* 105* 100* 101* 91  BUN 30* 27* 28* 30* 36*  CREATININE 4.03* 3.70* 3.88* 3.64* 3.61*  CALCIUM 9.7 9.8 9.9 10.0 9.8  MG -- -- 2.4 -- --    Lab 07/07/11 0904 07/04/11 0945 07/03/11 2149  INR 1.24 1.31 1.33  PROTIME -- -- --   Cardiac markers: No  results found for this basename: CK:3,CKMB:3,TROPONINI:3,MYOGLOBIN:3 in the last 168 hours No components found with this basename: POCBNP:3 Recent Results (from the past 240 hour(s))  PATHOLOGIST SMEAR REVIEW     Status: Normal   Collection Time   07/05/11  7:00 AM      Component Value Range Status Comment   Tech Review MODERATE ANEMIA WITH NORMAL INDICES   Final     Studies/Results: No results found.  Medications: Scheduled Meds:   . acyclovir  800 mg Oral BID  . allopurinol  100 mg Oral Daily  . antiseptic oral rinse  15 mL Mouth Rinse BID  . bortezomib SQ  1.3 mg/m2 (Ideal) Subcutaneous Once  . cyclophosphamide  100 mg/m2 Intravenous Once  . dexamethasone (DECADRON) IVPB  40 mg Intravenous Q24H  . dexamethasone  40 mg Intravenous Q24H  . furosemide  80 mg Oral BID  . ondansetron (ZOFRAN) IV  16 mg Intravenous Once  . pantoprazole  40 mg Oral Q1200  . potassium chloride  20 mEq Oral BID  . DISCONTD: acyclovir  800 mg Oral BID  .  DISCONTD: bortezomib SQ  1.3 mg/m2 Subcutaneous Once   Continuous Infusions:  PRN Meds:.acetaminophen, acetaminophen, alum & mag hydroxide-simeth, ondansetron (ZOFRAN) IV, ondansetron, zolpidem  Assessment/Plan:  Principal Problem:  *Anemia associated with acute blood loss - hemoglobin and hematocrit are stable  Active Problems:  Acute renal failure - likely secondary to multiple myeloma, chemotherapy will be started today 07/10/2011; hematology and renal is following; monitor CBC, BMP  Thrombocytopenia - platelets stable around 10-23; as per oncology we will transfuse if platelets<20 or if active bleed.   Epistaxis - resolved at present.   Hypokalemia - replete potassium     LOS: 7 days   Korby Ratay 07/10/2011, 5:39 PM

## 2011-07-10 NOTE — Progress Notes (Signed)
Notified Netta Corrigan RN, IV team of chemotherapy orders written for the patient to begin at 10 am.

## 2011-07-10 NOTE — Progress Notes (Signed)
I saw this patient in consultation on Saturday, 07/04/2011.  It is my clinical impression at that time that the patient most likely had multiple myeloma.  Unfortunately, that has been confirmed by bone marrow aspirate and biopsy.  The patient has very immature plasma cells, which have replaced his marrow almost at 100% level.  He may even have some circulating plasma blasts in the periphery.  The orders for chemotherapy were written last night, 07/09/2011.  The patient continues to have renal dysfunction with a BUN of 30 and a creatinine today of 4.03.  He also remains with thrombocytopenia with platelets of 23,000, hemoglobin of 10, hematocrit 28.7, and white count of 15.5.  We wrote orders for allopurinol 100 mg daily.  We also checked a uric acid today, which came back 15.4.  The patient was also started on acyclovir 800 mg twice a day.  Chemotherapy will consist of the following:  Cytoxan 100 mg/m2 (equal to 220 mg) IV, Velcade 1.3 mg/m2 (equal to 2.75 mg) given subcutaneously, and Decadron 40 mg IV for 3 days.  The patient does have coverage with Protonix.  We are checking lytes on a daily basis.  His glucose today was 106.  Height is 64 inches.  Weight is 198 pounds.  The body surface area is 2.2 m2.  The patient will be in over the weekend and, hopefully, go home Monday or Tuesday of next week, which will be 07/13/2011 or 07/14/2011.  He will need close followup through the office, perhaps CBCs on Mondays, Wednesdays, and Fridays with transfusions as needed.  It is my intent to treat him with Velcade and Decadron at least weekly in the hopes of clearing out his marrow.  We will also give him Decadron, too.    ______________________________ Samul Dada, M.D. DSM/MEDQ  D:  07/10/2011  T:  07/10/2011  Job:  [id number]

## 2011-07-11 DIAGNOSIS — D638 Anemia in other chronic diseases classified elsewhere: Secondary | ICD-10-CM | POA: Diagnosis present

## 2011-07-11 DIAGNOSIS — N08 Glomerular disorders in diseases classified elsewhere: Secondary | ICD-10-CM | POA: Diagnosis present

## 2011-07-11 LAB — CBC
MCH: 30.2 pg (ref 26.0–34.0)
MCV: 86.6 fL (ref 78.0–100.0)
MCV: 86.7 fL (ref 78.0–100.0)
Platelets: 18 10*3/uL — CL (ref 150–400)
Platelets: 35 10*3/uL — ABNORMAL LOW (ref 150–400)
RDW: 14.9 % (ref 11.5–15.5)
RDW: 14.8 % (ref 11.5–15.5)
WBC: 12.1 10*3/uL — ABNORMAL HIGH (ref 4.0–10.5)

## 2011-07-11 LAB — RENAL FUNCTION PANEL
Albumin: 3.5 g/dL (ref 3.5–5.2)
BUN: 50 mg/dL — ABNORMAL HIGH (ref 6–23)
Calcium: 8.6 mg/dL (ref 8.4–10.5)
Creatinine, Ser: 4.67 mg/dL — ABNORMAL HIGH (ref 0.50–1.35)
GFR calc non Af Amer: 12 mL/min — ABNORMAL LOW (ref >90)

## 2011-07-11 MED ORDER — SEVELAMER CARBONATE 800 MG PO TABS
800.0000 mg | ORAL_TABLET | Freq: Three times a day (TID) | ORAL | Status: DC
  Administered 2011-07-11 – 2011-07-13 (×8): 800 mg via ORAL
  Filled 2011-07-11 (×9): qty 1

## 2011-07-11 NOTE — Progress Notes (Signed)
S:appetite good NO NV feels well O:BP 143/82  Pulse 96  Temp(Src) 97.3 F (36.3 C) (Oral)  Resp 18  Ht 6\' 4"  (1.93 m)  Wt 89.8 kg (197 lb 15.6 oz)  BMI 24.10 kg/m2  SpO2 96%  Intake/Output Summary (Last 24 hours) at 07/11/11 1042 Last data filed at 07/11/11 0900  Gross per 24 hour  Intake    480 ml  Output      0 ml  Net    480 ml   Weight change: -0.1 kg (-3.5 oz) GNF:AOZHY and alert [facility]:rrr Resp:clear Abd:+BS NTND Ext:no edema NEURO:Ox3 no asterixis    . acyclovir  800 mg Oral BID  . allopurinol  100 mg Oral Daily  . antiseptic oral rinse  15 mL Mouth Rinse BID  . bortezomib SQ  1.3 mg/m2 (Ideal) Subcutaneous Once  . cyclophosphamide  100 mg/m2 Intravenous Once  . dexamethasone (DECADRON) IVPB  40 mg Intravenous Q24H  . dexamethasone  40 mg Intravenous Q24H  . furosemide  80 mg Oral BID  . pantoprazole  40 mg Oral Q1200  . sevelamer  800 mg Oral TID WC  . DISCONTD: potassium chloride  20 mEq Oral BID   No results found. BMET    Component Value Date/Time   NA 136 07/11/2011 0630   K 4.1 07/11/2011 0630   CL 100 07/11/2011 0630   CO2 19 07/11/2011 0630   GLUCOSE 153* 07/11/2011 0630   BUN 50* 07/11/2011 0630   CREATININE 4.67* 07/11/2011 0630   CALCIUM 8.6 07/11/2011 0630   GFRNONAA 12* 07/11/2011 0630   GFRAA 14* 07/11/2011 0630   CBC    Component Value Date/Time   WBC 9.7 07/11/2011 0630   RBC 2.91* 07/11/2011 0630   HGB 8.8* 07/11/2011 0630   HCT 25.2* 07/11/2011 0630   PLT 18* 07/11/2011 0630   MCV 86.6 07/11/2011 0630   MCH 30.2 07/11/2011 0630   MCHC 34.9 07/11/2011 0630   RDW 14.9 07/11/2011 0630   LYMPHSABS 4.5* 07/05/2011 0700   MONOABS 4.7* 07/05/2011 0700   EOSABS 0.3 07/05/2011 0700   BASOSABS 1.0* 07/05/2011 0700     Assessment:  1. 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 If volume depletion playing a role so will stop lasix 2. Hypercalcemia, improved 3. Thrombocytopenia    Plan Dc lasix Dc KCL Daily Scr   Mazi Schuff T

## 2011-07-11 NOTE — Progress Notes (Signed)
Patient ID: [name], male   DOB: 22-Jun-1946, 65 y.o.   MRN: [id number] Subjective: No events overnight. Patient denies chest pain, shortness of breath, abdominal pain. Patient had first cycle of chemotherapy and has tolerated it well. No reports of nausea or vomiting.   Objective:  Vital signs in last 24 hours:  Filed Vitals:   07/10/11 1800 07/10/11 2110 07/11/11 0532 07/11/11 0900  BP: 122/67 132/77 113/67 143/82  Pulse: 97 86 77 96  Temp: 97.6 F (36.4 C) 98 F (36.7 C) 98.5 F (36.9 C) 97.3 F (36.3 C)  TempSrc: Oral Oral Oral Oral  Resp: 20 18 20 18   Height:      Weight:  89.8 kg (197 lb 15.6 oz)    SpO2: 97% 94% 97% 96%    Intake/Output from previous day:   Intake/Output Summary (Last 24 hours) at 07/11/11 1211 Last data filed at 07/11/11 0900  Gross per 24 hour  Intake    480 ml  Output      0 ml  Net    480 ml    Physical Exam: General: Alert, awake, oriented x3, in no acute distress. HEENT: No bruits, no goiter. Moist mucous membranes, no scleral icterus, no conjunctival pallor. Heart: Regular rate and rhythm, without murmurs, rubs, gallops. Lungs: Clear to auscultation bilaterally. No wheezing, no rhonchi, no rales.  Abdomen: Soft, nontender, nondistended, positive bowel sounds. Extremities: No clubbing cyanosis or edema,  positive pedal pulses. Neuro: Grossly intact, nonfocal.    Lab Results:  Basic Metabolic Panel:    Component Value Date/Time   NA 136 07/11/2011 0630   K 4.1 07/11/2011 0630   CL 100 07/11/2011 0630   CO2 19 07/11/2011 0630   BUN 50* 07/11/2011 0630   CREATININE 4.67* 07/11/2011 0630   GLUCOSE 153* 07/11/2011 0630   CALCIUM 8.6 07/11/2011 0630   CBC:    Component Value Date/Time   WBC 9.7 07/11/2011 0630   HGB 8.8* 07/11/2011 0630   HCT 25.2* 07/11/2011 0630   PLT 18* 07/11/2011 0630   MCV 86.6 07/11/2011 0630   NEUTROABS 4.1 07/05/2011 0700   LYMPHSABS 4.5* 07/05/2011 0700   MONOABS 4.7* 07/05/2011 0700   EOSABS  0.3 07/05/2011 0700   BASOSABS 1.0* 07/05/2011 0700      Lab 07/11/11 0630 07/10/11 0617 07/09/11 0608 07/08/11 0700 07/07/11 0634 07/05/11 0700  WBC 9.7 15.5* 13.3* 14.6* 15.3* --  HGB 8.8* 10.0* 9.5* 9.4* 9.0* --  HCT 25.2* 28.7* 27.6* 27.2* 25.6* --  PLT 18* 23* 20* 24* 27* --  MCV 86.6 86.7 86.8 87.2 86.8 --  MCH 30.2 30.2 29.9 30.1 30.5 --  MCHC 34.9 34.8 34.4 34.6 35.2 --  RDW 14.9 14.9 15.1 15.3 15.2 --  LYMPHSABS -- -- -- -- -- 4.5*  MONOABS -- -- -- -- -- 4.7*  EOSABS -- -- -- -- -- 0.3  BASOSABS -- -- -- -- -- 1.0*  BANDABS -- -- -- -- -- --    Lab 07/11/11 0630 07/10/11 0617 07/09/11 0608 07/08/11 0700 07/07/11 0634  NA 136 141 140 144 144  K 4.1 3.3* 3.1* 3.7 3.1*  CL 100 104 104 109 112  CO2 19 22 22 24 20   GLUCOSE 153* 106* 105* 100* 101*  BUN 50* 30* 27* 28* 30*  CREATININE 4.67* 4.03* 3.70* 3.88* 3.64*  CALCIUM 8.6 9.7 9.8 9.9 10.0  MG -- -- -- 2.4 --    Lab 07/07/11 0904  INR 1.24  PROTIME --  Cardiac markers: No results found for this basename: CK:3,CKMB:3,TROPONINI:3,MYOGLOBIN:3 in the last 168 hours No components found with this basename: POCBNP:3 Recent Results (from the past 240 hour(s))  PATHOLOGIST SMEAR REVIEW     Status: Normal   Collection Time   07/05/11  7:00 AM      Component Value Range Status Comment   Tech Review MODERATE ANEMIA WITH NORMAL INDICES   Final     Studies/Results: No results found.  Medications: Scheduled Meds:   . acyclovir  800 mg Oral BID  . allopurinol  100 mg Oral Daily  . antiseptic oral rinse  15 mL Mouth Rinse BID  . bortezomib SQ  1.3 mg/m2 (Ideal) Subcutaneous Once  . cyclophosphamide  100 mg/m2 Intravenous Once  . dexamethasone (DECADRON) IVPB  40 mg Intravenous Q24H  . dexamethasone  40 mg Intravenous Q24H  . pantoprazole  40 mg Oral Q1200  . sevelamer  800 mg Oral TID WC  . DISCONTD: furosemide  80 mg Oral BID  . DISCONTD: potassium chloride  20 mEq Oral BID   Continuous Infusions:  PRN  Meds:.acetaminophen, acetaminophen, alum & mag hydroxide-simeth, ondansetron (ZOFRAN) IV, ondansetron, zolpidem  Assessment/Plan:  Principal Problem:  *Myeloma kidney disease - patient has started chemotherapy and has reported tolerating it well; hematology is following  Active Problems:  Anemia of chronic disease - hemoglobin dropped from 10-8.8, and we'll continue to monitor for now and transfuse if there is a drop below 8   HYPERTENSION - BP 143/82; this is the first blood pressure management slightly above the goal, we'll continue to monitor for now   Hypercalcemia - likely secondary to a probable diagnosis of multiple myeloma; hypercalcemia resolved at present   Thrombocytopenia - patient's platelets drop to below 20 so will transfuse one unit of platelets today and followup platelet count in the morning.  Education - the patient is aware of plan of care and treatment  Disposition - the dissipated date of discharge is in 2-3 days    LOS: 8 days   Jasline Buskirk 07/11/2011, 12:11 PM

## 2011-07-12 ENCOUNTER — Encounter (HOSPITAL_COMMUNITY): Payer: Self-pay | Admitting: Internal Medicine

## 2011-07-12 LAB — RENAL FUNCTION PANEL
Albumin: 3.7 g/dL (ref 3.5–5.2)
BUN: 74 mg/dL — ABNORMAL HIGH (ref 6–23)
GFR calc non Af Amer: 12 mL/min — ABNORMAL LOW (ref >90)
Phosphorus: 8.3 mg/dL — ABNORMAL HIGH (ref 2.3–4.6)
Potassium: 4.1 mEq/L (ref 3.5–5.1)
Sodium: 136 mEq/L (ref 135–145)

## 2011-07-12 LAB — CBC
Hemoglobin: 8.2 g/dL — ABNORMAL LOW (ref 13.0–17.0)
MCHC: 34.9 g/dL (ref 30.0–36.0)
RDW: 14.9 % (ref 11.5–15.5)

## 2011-07-12 LAB — PREPARE PLATELET PHERESIS: Unit division: 0

## 2011-07-12 NOTE — Progress Notes (Signed)
Md paged with results of critical platelets of 18. Awaiting response.

## 2011-07-12 NOTE — Progress Notes (Signed)
Patient using urinal and patient girlfriend attempted to help him and got urine on her shirt and pants. Patient girlfriend stated she did not have gloves on. Instructed her to wash her hands twice and to change her clothing. Also alerted her to sign with precautions regarding chemotherapy patients. Instructed her to notify staff to help with patient, especially dealing with bodily fluids and disposal. Protective gloves were present in room and were visible. Patient and his girlfriend verbalized understanding.

## 2011-07-12 NOTE — Progress Notes (Signed)
S:appetite good NO NV feels well  Says he was told possibly home in am O:BP 127/73  Pulse 97  Temp(Src) 97.6 F (36.4 C) (Oral)  Resp 20  Ht 6\' 4"  (1.93 m)  Wt 91.4 kg (201 lb 8 oz)  BMI 24.53 kg/m2  SpO2 98%  Intake/Output Summary (Last 24 hours) at 07/12/11 1052 Last data filed at 07/12/11 0900  Gross per 24 hour  Intake  999.5 ml  Output      0 ml  Net  999.5 ml   Weight change: 1.6 kg (3 lb 8.4 oz) AVW:[id number] and alert [facility]:rrr Resp:clear Abd:+BS NTND Ext:no edema NEURO:Ox3 no asterixis    . acyclovir  800 mg Oral BID  . allopurinol  100 mg Oral Daily  . antiseptic oral rinse  15 mL Mouth Rinse BID  . dexamethasone (DECADRON) IVPB  40 mg Intravenous Q24H  . dexamethasone  40 mg Intravenous Q24H  . pantoprazole  40 mg Oral Q1200  . sevelamer  800 mg Oral TID WC   No results found. BMET    Component Value Date/Time   NA 136 07/12/2011 0540   K 4.1 07/12/2011 0540   CL 100 07/12/2011 0540   CO2 17* 07/12/2011 0540   GLUCOSE 143* 07/12/2011 0540   BUN 74* 07/12/2011 0540   CREATININE 4.69* 07/12/2011 0540   CALCIUM 7.6* 07/12/2011 0540   GFRNONAA 12* 07/12/2011 0540   GFRAA 14* 07/12/2011 0540   CBC    Component Value Date/Time   WBC 9.4 07/12/2011 0540   RBC 2.70* 07/12/2011 0540   HGB 8.2* 07/12/2011 0540   HCT 23.5* 07/12/2011 0540   PLT 18* 07/12/2011 0540   MCV 87.0 07/12/2011 0540   MCH 30.4 07/12/2011 0540   MCHC 34.9 07/12/2011 0540   RDW 14.9 07/12/2011 0540   LYMPHSABS 4.5* 07/05/2011 0700   MONOABS 4.7* 07/05/2011 0700   EOSABS 0.3 07/05/2011 0700   BASOSABS 1.0* 07/05/2011 0700     Assessment:  1. 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 Hypercalcemia, improved 3. Thrombocytopenia    Plan Recheck labs in am.  If renal fx stable then could go from my standpt   [name]

## 2011-07-12 NOTE — Progress Notes (Signed)
Patient nose bleeding drying up. States that he believes he thinks it started bleeding because he was emotional.

## 2011-07-12 NOTE — Progress Notes (Signed)
Patient wiping nose, small amount of bleeding noted. Patient very emotional/tearful at present due to visits from Rachel and friends throughout the day. Will monitor.

## 2011-07-12 NOTE — Progress Notes (Signed)
Patient ID: [name], male   DOB: August 03, 1945, 65 y.o.   MRN: [id number] Subjective: No events overnight. Patient denies chest pain, shortness of breath, abdominal pain. Patient reports feeling well today and ambulating.  Objective:  Vital signs in last 24 hours:  Filed Vitals:   07/11/11 1741 07/11/11 2053 07/12/11 0535 07/12/11 1000  BP: 146/75 145/82 130/74 127/73  Pulse: 88 73 82 97  Temp: 97.4 F (36.3 C) 97.8 F (36.6 C) 97.8 F (36.6 C) 97.6 F (36.4 C)  TempSrc: Oral Oral Oral Oral  Resp: 19 20 20 20   Height:      Weight:  91.4 kg (201 lb 8 oz)    SpO2:  98% 96% 98%    Intake/Output from previous day:   Intake/Output Summary (Last 24 hours) at 07/12/11 1159 Last data filed at 07/12/11 0900  Gross per 24 hour  Intake  999.5 ml  Output      0 ml  Net  999.5 ml    Physical Exam: General: Alert, awake, oriented x3, in no acute distress. HEENT: No bruits, no goiter. Moist mucous membranes, no scleral icterus, no conjunctival pallor. Heart: Regular rate and rhythm, without murmurs, rubs, gallops. Lungs: Clear to auscultation bilaterally. No wheezing, no rhonchi, no rales.  Abdomen: Soft, nontender, nondistended, positive bowel sounds. Extremities: No clubbing cyanosis or edema,  positive pedal pulses. Neuro: Grossly intact, nonfocal.    Lab Results:  Basic Metabolic Panel:    Component Value Date/Time   NA 136 07/12/2011 0540   K 4.1 07/12/2011 0540   CL 100 07/12/2011 0540   CO2 17* 07/12/2011 0540   BUN 74* 07/12/2011 0540   CREATININE 4.69* 07/12/2011 0540   GLUCOSE 143* 07/12/2011 0540   CALCIUM 7.6* 07/12/2011 0540   CBC:    Component Value Date/Time   WBC 9.4 07/12/2011 0540   HGB 8.2* 07/12/2011 0540   HCT 23.5* 07/12/2011 0540   PLT 18* 07/12/2011 0540   MCV 87.0 07/12/2011 0540   NEUTROABS 4.1 07/05/2011 0700   LYMPHSABS 4.5* 07/05/2011 0700   MONOABS 4.7* 07/05/2011 0700   EOSABS 0.3 07/05/2011 0700   BASOSABS 1.0* 07/05/2011 0700       Lab 07/12/11 0540 07/11/11 1834 07/11/11 0630 07/10/11 0617 07/09/11 0608  WBC 9.4 12.1* 9.7 15.5* 13.3*  HGB 8.2* 8.6* 8.8* 10.0* 9.5*  HCT 23.5* 24.7* 25.2* 28.7* 27.6*  PLT 18* 35* 18* 23* 20*  MCV 87.0 86.7 86.6 86.7 86.8  MCH 30.4 30.2 30.2 30.2 29.9  MCHC 34.9 34.8 34.9 34.8 34.4  RDW 14.9 14.8 14.9 14.9 15.1  LYMPHSABS -- -- -- -- --  MONOABS -- -- -- -- --  EOSABS -- -- -- -- --  BASOSABS -- -- -- -- --  BANDABS -- -- -- -- --    Lab 07/12/11 0540 07/11/11 0630 07/10/11 0617 07/09/11 0608 07/08/11 0700  NA 136 136 141 140 144  K 4.1 4.1 3.3* 3.1* 3.7  CL 100 100 104 104 109  CO2 17* 19 22 22 24   GLUCOSE 143* 153* 106* 105* 100*  BUN 74* 50* 30* 27* 28*  CREATININE 4.69* 4.67* 4.03* 3.70* 3.88*  CALCIUM 7.6* 8.6 9.7 9.8 9.9  MG -- -- -- -- 2.4    Lab 07/07/11 0904  INR 1.24  PROTIME --   Cardiac markers: No results found for this basename: CK:3,CKMB:3,TROPONINI:3,MYOGLOBIN:3 in the last 168 hours No components found with this basename: POCBNP:3 Recent Results (from the past 240 hour(s))  PATHOLOGIST SMEAR REVIEW     Status: Normal   Collection Time   07/05/11  7:00 AM      Component Value Range Status Comment   Tech Review MODERATE ANEMIA WITH NORMAL INDICES   Final     Studies/Results: No results found.  Medications: Scheduled Meds:   . acyclovir  800 mg Oral BID  . allopurinol  100 mg Oral Daily  . antiseptic oral rinse  15 mL Mouth Rinse BID  . dexamethasone (DECADRON) IVPB  40 mg Intravenous Q24H  . dexamethasone  40 mg Intravenous Q24H  . pantoprazole  40 mg Oral Q1200  . 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  Active Problems:   Anemia of chronic  disease - hemoglobin dropped from 10-8.2, we will transfuse 2 Units of packed RBCs   HYPERTENSION - BP 127/73   Hypercalcemia - likely secondary to a probable diagnosis of multiple myeloma; hypercalcemia resolved at present    Thrombocytopenia - patient is status post one unit of platelet transfusion, platelets trended upwards from 18-35 however the most recent lab from today shows a pacifier again to 18. We will transfuse one more unit of platelets and followup CBC afterwards.   Education - the patient is aware of plan of care and treatment    Disposition - the dissipated date of discharge Monday am.     LOS: 9 days   Keyontae Huckeby 07/12/2011, 11:59 AM

## 2011-07-13 DIAGNOSIS — N179 Acute kidney failure, unspecified: Secondary | ICD-10-CM | POA: Diagnosis present

## 2011-07-13 DIAGNOSIS — C9000 Multiple myeloma not having achieved remission: Principal | ICD-10-CM

## 2011-07-13 LAB — RENAL FUNCTION PANEL
CO2: 19 mEq/L (ref 19–32)
Chloride: 99 mEq/L (ref 96–112)
GFR calc Af Amer: 16 mL/min — ABNORMAL LOW (ref >90)
GFR calc non Af Amer: 14 mL/min — ABNORMAL LOW (ref >90)
Sodium: 134 mEq/L — ABNORMAL LOW (ref 135–145)

## 2011-07-13 LAB — UIFE/LIGHT CHAINS/TP QN, 24-HR UR
Albumin, U: DETECTED
Free Lambda Excretion/Day: 28.8 mg/d
Free Lambda Lt Chains,Ur: 0.72 mg/dL — ABNORMAL HIGH (ref 0.02–0.67)
Time: 24 hours
Total Protein, Urine-Ur/day: 7800 mg/d — ABNORMAL HIGH (ref 10–140)
Total Protein, Urine: 195 mg/dL
Volume, Urine: 4000 mL

## 2011-07-13 LAB — TYPE AND SCREEN: Unit division: 0

## 2011-07-13 LAB — CBC
HCT: 26.1 % — ABNORMAL LOW (ref 39.0–52.0)
HCT: 27 % — ABNORMAL LOW (ref 39.0–52.0)
Hemoglobin: 9.1 g/dL — ABNORMAL LOW (ref 13.0–17.0)
Hemoglobin: 9.5 g/dL — ABNORMAL LOW (ref 13.0–17.0)
MCH: 30 pg (ref 26.0–34.0)
MCH: 30.5 pg (ref 26.0–34.0)
MCHC: 34.9 g/dL (ref 30.0–36.0)
MCHC: 35.2 g/dL (ref 30.0–36.0)
MCV: 86.8 fL (ref 78.0–100.0)
Platelets: 52 10*3/uL — ABNORMAL LOW (ref 150–400)
RBC: 3.11 MIL/uL — ABNORMAL LOW (ref 4.22–5.81)
RDW: 14.8 % (ref 11.5–15.5)
WBC: 8.7 10*3/uL (ref 4.0–10.5)

## 2011-07-13 LAB — PREPARE PLATELET PHERESIS

## 2011-07-13 LAB — FERRITIN: Ferritin: 4730 ng/mL — ABNORMAL HIGH (ref 22–322)

## 2011-07-13 MED ORDER — SEVELAMER CARBONATE 800 MG PO TABS: 800.0000 mg | ORAL_TABLET | Freq: Three times a day (TID) | ORAL | Status: DC

## 2011-07-13 MED ORDER — ACYCLOVIR 800 MG PO TABS: 800.0000 mg | ORAL_TABLET | Freq: Two times a day (BID) | ORAL | Status: AC

## 2011-07-13 MED ORDER — ONE-DAILY MULTI VITAMINS PO TABS: 1.0000 | ORAL_TABLET | Freq: Every day | ORAL | Status: DC

## 2011-07-13 MED ORDER — ONDANSETRON HCL 4 MG PO TABS: 4.0000 mg | ORAL_TABLET | Freq: Four times a day (QID) | ORAL | Status: AC | PRN

## 2011-07-13 MED ORDER — ALLOPURINOL 100 MG PO TABS: 100.0000 mg | ORAL_TABLET | Freq: Every day | ORAL | Status: DC

## 2011-07-13 MED ORDER — PANTOPRAZOLE SODIUM 40 MG PO TBEC: 40.0000 mg | DELAYED_RELEASE_TABLET | Freq: Every day | ORAL | Status: DC

## 2011-07-13 MED ORDER — ZOLPIDEM TARTRATE 5 MG PO TABS: 5.0000 mg | ORAL_TABLET | Freq: Every evening | ORAL | Status: DC | PRN

## 2011-07-13 MED ORDER — SIMVASTATIN 40 MG PO TABS: 20.0000 mg | ORAL_TABLET | Freq: Every day | ORAL | Status: DC

## 2011-07-13 NOTE — Progress Notes (Signed)
Full assessment placed in chart. CSW provided patient with resources for transportation with [employer] and [employer]. Pt reported no further concerns. CSW is signing off but can be consulted if further needs arise. [place], [place] [phone]

## 2011-07-13 NOTE — Discharge Summary (Signed)
Patient ID: [name] MRN: [id number] DOB/AGE: 65-Sep-1947 65 y.o.  Admit date: 07/03/2011 Discharge date: 07/13/2011  Primary Care Physician:  [name], MD, MD  Discharge Diagnoses:    Present on Admission:  .Anemia associated with acute blood loss .Epistaxis .Thrombocytopenia .GERD .Hypercalcemia .HYPERTENSION .Myeloma kidney disease .Anemia of chronic disease .Acute renal failure  Principal Problem:  *Myeloma kidney disease Active Problems:  Anemia of chronic disease  HYPERTENSION  Hypercalcemia  Thrombocytopenia  Acute renal failure   Current Discharge Medication List    START taking these medications   Details  acyclovir (ZOVIRAX) 800 MG tablet Take 1 tablet (800 mg total) by mouth 2 (two) times daily. Qty: 30 tablet, Refills: 0    allopurinol (ZYLOPRIM) 100 MG tablet Take 1 tablet (100 mg total) by mouth daily. Qty: 30 tablet, Refills: 0   Associated Diagnoses: Renal failure    ondansetron (ZOFRAN) 4 MG tablet Take 1 tablet (4 mg total) by mouth every 6 (six) hours as needed for nausea. Qty: 20 tablet, Refills: 0    pantoprazole (PROTONIX) 40 MG tablet Take 1 tablet (40 mg total) by mouth daily at 12 noon. Qty: 30 tablet, Refills: 0    sevelamer (RENVELA) 800 MG tablet Take 1 tablet (800 mg total) by mouth 3 (three) times daily with meals. Qty: 90 tablet, Refills: 0    zolpidem (AMBIEN) 5 MG tablet Take 1 tablet (5 mg total) by mouth at bedtime as needed for sleep (insomnia). Qty: 30 tablet, Refills: 0      CONTINUE these medications which have CHANGED   Details  Multiple Vitamin (MULTIVITAMIN) tablet Take 1 tablet by mouth daily. Qty: 30 tablet, Refills: 0    simvastatin (ZOCOR) 40 MG tablet Take 0.5 tablets (20 mg total) by mouth at bedtime. Qty: 30 tablet, Refills: 0        Disposition and Follow-up: follow up with renal and hematology/oncology   Consults:   1. Renal 2. Hematology/Oncology  Significant Diagnostic Studies:  Dg  Chest 2 View  07/04/2011    IMPRESSION: No evidence of acute cardiopulmonary disease.  Right posterolateral 8th rib fracture deformity.   Dg Bone Survey Met  07/04/2011   IMPRESSION: No lytic lesions are seen in the visualized axial or appendicular skeleton.  Mild superior endplate compression deformities at T12 and L3.  Per CMS PQRS reporting requirements (PQRS Measure 24): Given the patient's age of greater than 50 and the fracture site (spine), the patient should be tested for osteoporosis using DXA, and the appropriate treatment considered based on the DXA results.     Brief H and P: 65 y.o. male with a history of prostate cancer diagnosed in 2008 and is S/P Prostatectomy who was sent to the Emergency Dpartment due to Abnormal labs which included an elevated BUN and Creatinine but also an elevated Calcium level. Patient has had severe back pain for the past month and was found to have a compression fractures and was to have a kyphoplasty performed by Interventional Radiology. As part of that workup he had labs performed and was to have an MRI, the labs returned as mentioned above. Repeat of the laboratory studies in the Emergency Department revealed a Calcium level of 14.5, and a BUN = 60 and a Creatinine of 5.10.  Over the past month prior to admission patient reported having severe indigestion, and poor appetite and unintentional weight loss of 25 pounds. He has also had the back pain. His significant other reports that he has also had  increased confusion as well. He denies having fevers, or chills, or night sweats or cough or chest pain.    Physical Exam on Discharge:  Filed Vitals:   07/13/11 1435 07/13/11 1535 07/13/11 1615 07/13/11 1630  BP: 137/79 135/80 128/81 128/81  Pulse: 81 83 84 84  Temp: 97.7 F (36.5 C) 97.7 F (36.5 C) 98 F (36.7 C) 98 F (36.7 C)  TempSrc: Oral Oral Oral Oral  Resp: 18 18 20 20   Height:      Weight:      SpO2:         Intake/Output Summary (Last 24  hours) at 07/13/11 1638 Last data filed at 07/13/11 1630  Gross per 24 hour  Intake 2225.5 ml  Output      0 ml  Net 2225.5 ml    General: Alert, awake, oriented x3, in no acute distress. HEENT: No bruits, no goiter. Heart: Regular rate and rhythm, without murmurs, rubs, gallops. Lungs: Clear to auscultation bilaterally. Abdomen: Soft, nontender, nondistended, positive bowel sounds. Extremities: No clubbing cyanosis or edema with positive pedal pulses. Neuro: Grossly intact, nonfocal.  CBC:    Component Value Date/Time   WBC 6.7 07/13/2011 0408   HGB 9.1* 07/13/2011 0408   HCT 26.1* 07/13/2011 0408   PLT 16* 07/13/2011 0408   MCV 86.1 07/13/2011 0408   NEUTROABS 4.1 07/05/2011 0700   LYMPHSABS 4.5* 07/05/2011 0700   MONOABS 4.7* 07/05/2011 0700   EOSABS 0.3 07/05/2011 0700   BASOSABS 1.0* 07/05/2011 0700    Basic Metabolic Panel:    Component Value Date/Time   NA 134* 07/13/2011 0400   K 4.8 07/13/2011 0400   CL 99 07/13/2011 0400   CO2 19 07/13/2011 0400   BUN 79* 07/13/2011 0400   CREATININE 4.17* 07/13/2011 0400   GLUCOSE 152* 07/13/2011 0400   CALCIUM 7.3* 07/13/2011 0400    Assessment/Plan:  Principal Problem:  *Myeloma kidney disease - patient started chemotherapy and has reported tolerating it well; pt needs to follow up with an outpatient oncology for further treatment   Active Problems:  Anemia of chronic disease -Patient is status post 2 units of packed RBC's transfusion; patient needs to follow up in outpatient clinic CBC   HYPERTENSION - at goal  Hypercalcemia - likely secondary to a probable diagnosis of multiple myeloma; hypercalcemia resolved at present ; monitor electrolytes closely in outpatient clinic.  Thrombocytopenia - patient is status post 5 unit of platelet transfusion; patient needs to follow up on an outpatient basis and closely monitor CBC  Education - the patient is aware of plan of care and treatment   Disposition -patient is  clinically well to be discharged home today   Time spent on Discharge: Greater than 30 minutes  Signed: Bell Carbo 07/13/2011, 4:38 PM

## 2011-07-13 NOTE — Progress Notes (Signed)
Subjective:   Events since 12/13 observed. Receiving platelets transfusion. Pt wishes to go home. Tolerated chemo well, no significant nausea. No vomiting. No diarrhea or constipation. No fever.   Objective: Vital signs in last 24 hours: BP 128/74  Pulse 88  Temp(Src) 98 F (36.7 C) (Oral)  Resp 20  Ht 6\' 4"  (1.93 m)  Wt 201 lb 8 oz (91.4 kg)  BMI 24.53 kg/m2  SpO2 98%   Physical Exam: 65 y.o.  in no acute distress  A. and O. x3 HEENT: Sclera anicteric. Oral cavity without thrush or lesions. Neck supple.no cervical or supraclavicular adenopathy  Lungs: CTA. No wheezing, rhonchi or rales. No axillary masses. CV regular rate and rhythm normal S1-S2, no murmur , rubs or gallops Abdomen soft nontender , bowel sounds x4  no hepatosplenomegaly GU/rectal: deferred. Extremities: no clubbing cyanosis . No edema. Some areas of echymmosis at the area of biopsy Neurologic: non focal    Lab Results:  CBC   Lab 07/13/11 0408 07/12/11 0540 07/11/11 1834 07/11/11 0630 07/10/11 0617  WBC 6.7 9.4 12.1* 9.7 15.5*  HGB 9.1* 8.2* 8.6* 8.8* 10.0*  HCT 26.1* 23.5* 24.7* 25.2* 28.7*  PLT 16* 18* 35* 18* 23*  MCV 86.1 87.0 86.7 86.6 86.7  MCH 30.0 30.4 30.2 30.2 30.2  MCHC 34.9 34.9 34.8 34.9 34.8  RDW 14.6 14.9 14.8 14.9 14.9  LYMPHSABS -- -- -- -- --  MONOABS -- -- -- -- --  EOSABS -- -- -- -- --  BASOSABS -- -- -- -- --  BANDABS -- -- -- -- --    CMP    Lab 07/13/11 0400 07/12/11 0540 07/11/11 0630 07/10/11 0617 07/09/11 0608 07/08/11 0700  NA 134* 136 136 141 140 --  K 4.8 4.1 4.1 3.3* 3.1* --  CL 99 100 100 104 104 --  CO2 19 17* 19 22 22  --  GLUCOSE 152* 143* 153* 106* 105* --  BUN 79* 74* 50* 30* 27* --  CREATININE 4.17* 4.69* 4.67* 4.03* 3.70* --  CALCIUM 7.3* 7.6* 8.6 9.7 9.8 --  MG -- -- -- -- -- 2.4  AST -- -- -- -- -- --  ALT -- -- -- -- -- --  ALKPHOS -- -- -- -- -- --  BILITOT -- -- -- -- -- --        Component Value Date/Time   BILITOT 1.0 07/04/2011 0901     BILIDIR 0.2 07/04/2011 0901   IBILI 0.8 07/04/2011 0901     Anemia panel:  Basename 07/13/11 0408  VITAMINB12 --  FOLATE --  FERRITIN 4730*  TIBC NOT CALC  IRON 228*  RETICCTPCT --    No results found for this basename: TSH,T4TOTAL,FREET3,T3FREE,THYROIDAB in the last 72 hours   No results found for this basename: esrsedrate     Total Protein, Urine-Ur/day 7800 (H) 10 - 140 mg/day Comments: (NOTE) Total urinary protein is determined by adding the albumin and Kappa and/or Lambda light chains. This value may not agree with the total protein as determined by chemical methods, which characteristically underestimate urinary light chains . Albumin, U DETECTED DETECTED  Alpha 1, Urine DETECTED (A) NONE DETECTED  Alpha 2, Urine DETECTED (A) NONE DETECTED  Beta, Urine DETECTED (A) NONE DETECTED  Gamma Globulin, Urine DETECTED (A) NONE DETECTED  Free Kappa Lt Chains,ur PENDING mg/dL  Free Lt Chn Excr Rate PENDING mg/d  Free Lambda Lt Chains,Ur 0.72 (H) 0.02 - 0.67 mg/dL  Free Lambda Excretion/Day 28.80 mg/d  Free Kappa/Lambda Ratio  PENDING ratio Immunofixation,Urine (NOTE) Comments: Positive for monoclonal free Kappa light chains (Bence Jones protein). Reviewed by Dallas Breeding, MD, PhD, FCAP (Electronic Signature on File)  SPEP Mspike 0.19  Uric acid 15.4 (12/14)   Lab 07/07/11 0904  INR 1.24  PROTIME --    No results found for this basename: DDIMER:2 in the last 72 hours  Imaging Studies:  No results found.  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 - SEE COMMENT. PERIPHERAL BLOOD: - NORMOCYTIC-NORMOCHROMIC ANEMIA. - LEUKOCYTOSIS WITH LEUKOERYTHROBLASTIC REACTION. - ATYPICAL CIRCULATING PLASMACYTOID CELLS - THROMBOCYTOPENIA.  Diagnosis Note The bone marrow is extensively involved by an atypical mononuclear cell infiltrate with  plasmacytoid features. The cells display immature features with vesicular/fine chromatin and prominent nucleoli.Immunohistochemical stains show that the cells are strongly positive for CD138 and cytoplasmic kappa light chain. Flow cytometric analysis also confirms the immunohistochemical findings with positivity for CD38 and cytoplasmic kappa light chain. There is no expression of T, B or myeloid antigens in the atypical cells. The findings are most consistent with plasma cell myeloma which appears to have plasmablastic features. The  peripheral blood shows similar circulating cells but the level is less than pathologically necessary for the designation of plasma cell leukemia. Clinical correlation is recommended. (BNS:kh 07-10-11) Guerry Bruin MD Pathologist, Electronic Signature (Case signed 07/10/2011)  GROSS AND MICROSCOPIC INFORMATION Specimen Clinical Information Anemia; ? Multiple myeloma, ALS (isv) Source Bone Marrow, Aspirate,Biopsy, and Clot, left iliac 1 of 3 FINAL for LENIX, KIDD ([id number]) Microscopic LAB DATA: CBC performed on 07/08/2011 shows: WBC 14.6 K/ul Neutrophils 33% HB 9.4 g/dl Lymphocytes 81% HCT 19.1 % Monocytes 3% MCV 87.2 fL Eosinophils 2% RDW 15.3 % Basophils 1% PLT 24 K/ul PERIPHERAL BLOOD SMEAR: The red blood cells display mild anisopoikilocytosis with minimal  polychromasia. Obvious rouleaux formation is not identified. Occasional nucleated red blood cells are seen  on scan The neutrophilic cells display left shifted maturation with scattered myelocytes. There are many  atypical mononuclar cells some of which show plasmacytoid features. The latter are estimated at 15% of all  cells. Occasional blastic cells are seen on scan. The platelets are decreased in number. BONE MARROW ASPIRATE: Erythroid precursors: Markedly decreased in number Granulocytic precursors: Markedly decreased in number Megakaryocytes: Decreased in number with scattered forms present.  Lymphocytes/plasma cells: There is extensive involvement by an atypical mononuclear cell infiltrate characterized by medium to large sized cells with fine chromatin, prominent nucleoli and scanty to moderately abundant agranular cytoplasm. Many of the cells display plasmacytoid features. TOUCH PREPARATIONS: Abundance of atypical mononuclar cells with features similar to aspirate. CLOT and BIOPSY: The sections show 90-100% cellularity. There is extensive replacement of the medullary space by a relatively monomorphic atypical mononuclear cell infiltrate characterized by cells with vesicular chromatin, prominent nucleoli and relatively abundant cytoplasm. Many of the cells display plasmacytoid features. A battery of immunohistochemical stains were performed including CD10, CD117, CD138, CD20, CD3, CD34, CD43, LCA, CD45RO, CD56, CD79a, kappa, lambda and myeloperoxidase. The atypical mononuclear cells are positive for CD138 (plasma cell marker), and cytoplasmic kappa light chain. There is patchy weak positivity for LCA and CD43. The atypical mononuclear cells are negative for all other stains.  IRON STAIN: Iron stains are performed on a bone marrow aspirate smear and section of clot. The controls stained appropriately. Storage Iron: Present Ringed Sideroblasts: Absent. ADDITIONAL DATA / TESTING: Flow cytometric analysis ([id number]) show a population of plasma cells  displaying positivity for CD38 and cytoplasmic kappa. The specimen was sent for cytogenetic analysis and a separate report will follow.  Assessment/Plan:  Principal Problem:  * Multiple Myeloma: s/p Chemotherapy  12/13 with  Cytoxan 100 mg/m2 (equal to 220 mg) IV, Velcade 1.3 mg/m2 (equal to 2.75 mg) given subcutaneously, and Decadron 40 mg IV for 3 days. The patient does have coverage with Protonix.  FISH studies pending. Thrombocytopenia: receiving  Transfusion now and  as needed  Anemia of chronic disease: pt to receive transfusion  today Renal disease  HYPERTENSION  Hypercalcemia  Elevated uric acid: on allopurinol since 12/13 Disposition: likely home today. Pt needs to be f/u as OP  with labs. Office to call patient for apt.   LOS: 10 days   [facility] E 07/13/2011, 2:27 PM    Pt did well with chemotherapy on 07/10/11.  No apparent side effects.  He needed platelet Tx yesterday and today.  He apparently will be going home this evening.  I have spoken to pt about the importance of close f/u.  We will need to check CBCs every day or 2 and transfuse as indicated.  We will also follow BMET and uric acid. Renal function remains precarious and unfortunately, he is at high risk to go into complete renal failure and to need hemodialysis.  It is important that the renal doctors follow him closely as well.  We will continue to treat him with weekly chemo as tolerated.    Samul Dada, MD 07/13/11

## 2011-07-13 NOTE — Progress Notes (Signed)
Subjective: Interval History: none.  Objective: Vital signs in last 24 hours: Temp:  [97.5 F (36.4 C)-98.2 F (36.8 C)] 98.2 F (36.8 C) (12/17 1000) Pulse Rate:  [77-99] 99  (12/17 1000) Resp:  [16-20] 20  (12/17 1000) BP: (106-147)/(67-84) 147/78 mmHg (12/17 1000) SpO2:  [95 %-99 %] 98 % (12/17 1000) Weight change:   Intake/Output from previous day: 12/16 0701 - 12/17 0700 In: 1524 [P.O.:720; Blood:804] Out: 0  Intake/Output this shift: Total I/O In: 240 [P.O.:240] Out: -   General appearance: alert, cooperative, mildly obese and pale Resp: wheezes bibasilar Cardio: S1, S2 normal and systolic murmur: holosystolic 2/6, blowing at apex GI: normal findings: liver down 4 cm Skin: dry,scaling  Lab Results:  Basename 07/13/11 0408 07/12/11 0540  WBC 6.7 9.4  HGB 9.1* 8.2*  HCT 26.1* 23.5*  PLT 16* 18*   BMET:  Basename 07/13/11 0400 07/12/11 0540  NA 134* 136  K 4.8 4.1  CL 99 100  CO2 19 17*  GLUCOSE 152* 143*  BUN 79* 74*  CREATININE 4.17* 4.69*  CALCIUM 7.3* 7.6*   No results found for this basename: PTH:2 in the last 72 hours Iron Studies:  Basename 07/13/11 0408  IRON 228*  TIBC NOT CALC  TRANSFERRIN --  FERRITIN 4730*    Studies/Results: No results found.  I have reviewed the patient's current medications.  Assessment/Plan: 1 Arf, ? CKD.   Cr lower, but ? Where it will plateau.Marland KitchenMarland KitchenMild acidemia,getting better. Vol ok. 2 MM per Heme 3 Anemia follow  p can d/c and f/u with Dr Briant Cedar.    LOS: 10 days   [name] L 07/13/2011,1:35 PM

## 2011-07-14 ENCOUNTER — Other Ambulatory Visit: Payer: Self-pay

## 2011-07-14 ENCOUNTER — Telehealth: Payer: Self-pay | Admitting: Oncology

## 2011-07-14 LAB — PREPARE PLATELET PHERESIS: Unit division: 0

## 2011-07-14 NOTE — Telephone Encounter (Signed)
lmonvm adviisng the pt of his lab appt on 07/15/2011@12 :30pm

## 2011-07-15 ENCOUNTER — Other Ambulatory Visit: Payer: Self-pay | Admitting: *Deleted

## 2011-07-15 ENCOUNTER — Telehealth: Payer: Self-pay | Admitting: Oncology

## 2011-07-15 ENCOUNTER — Other Ambulatory Visit (HOSPITAL_COMMUNITY): Payer: Self-pay | Admitting: Oncology

## 2011-07-15 ENCOUNTER — Other Ambulatory Visit (HOSPITAL_BASED_OUTPATIENT_CLINIC_OR_DEPARTMENT_OTHER): Payer: BC Managed Care – PPO | Admitting: Lab

## 2011-07-15 ENCOUNTER — Other Ambulatory Visit: Payer: Self-pay | Admitting: Oncology

## 2011-07-15 DIAGNOSIS — N179 Acute kidney failure, unspecified: Secondary | ICD-10-CM

## 2011-07-15 DIAGNOSIS — C9000 Multiple myeloma not having achieved remission: Secondary | ICD-10-CM

## 2011-07-15 LAB — CBC WITH DIFFERENTIAL/PLATELET
MCHC: 34.1 g/dL (ref 32.0–36.0)
MCV: 87.9 fL (ref 79.3–98.0)
Platelets: 26 10*3/uL — ABNORMAL LOW (ref 140–400)
RBC: 3.3 10*6/uL — ABNORMAL LOW (ref 4.20–5.82)
RDW: 14.7 % — ABNORMAL HIGH (ref 11.0–14.6)
WBC: 10.4 10*3/uL — ABNORMAL HIGH (ref 4.0–10.3)

## 2011-07-15 LAB — BASIC METABOLIC PANEL
BUN: 58 mg/dL — ABNORMAL HIGH (ref 6–23)
Chloride: 101 mEq/L (ref 96–112)
Creatinine, Ser: 3.2 mg/dL — ABNORMAL HIGH (ref 0.50–1.35)

## 2011-07-15 LAB — MANUAL DIFFERENTIAL
ALC: 3.4 10*3/uL — ABNORMAL HIGH (ref 0.9–3.3)
ANC (CHCC manual diff): 5.6 10*3/uL (ref 1.5–6.5)
Basophil: 1 % (ref 0–2)
Blasts: 0 % (ref 0–0)
Metamyelocytes: 6 % — ABNORMAL HIGH (ref 0–0)
Myelocytes: 5 % — ABNORMAL HIGH (ref 0–0)
PROMYELO: 0 % (ref 0–0)
Variant Lymph: 9 % — ABNORMAL HIGH (ref 0–0)

## 2011-07-15 LAB — URIC ACID: Uric Acid, Serum: 11 mg/dL — ABNORMAL HIGH (ref 4.0–7.8)

## 2011-07-15 NOTE — Progress Notes (Signed)
Spoke with pt in the lobby today.  Per pt, he has been taking Allopurinol 100 mg daily as instructed by md. Pt to obtain new appt calendar with a scheduler.

## 2011-07-15 NOTE — Telephone Encounter (Signed)
gve the pt's caregiver the dec 2012 appt calendar

## 2011-07-16 ENCOUNTER — Other Ambulatory Visit: Payer: BC Managed Care – PPO

## 2011-07-16 ENCOUNTER — Encounter: Payer: Self-pay | Admitting: Oncology

## 2011-07-16 ENCOUNTER — Encounter: Payer: Self-pay | Admitting: *Deleted

## 2011-07-16 ENCOUNTER — Other Ambulatory Visit: Payer: Self-pay | Admitting: Oncology

## 2011-07-17 ENCOUNTER — Ambulatory Visit (HOSPITAL_BASED_OUTPATIENT_CLINIC_OR_DEPARTMENT_OTHER): Payer: BC Managed Care – PPO | Admitting: Oncology

## 2011-07-17 ENCOUNTER — Ambulatory Visit (HOSPITAL_BASED_OUTPATIENT_CLINIC_OR_DEPARTMENT_OTHER): Payer: BC Managed Care – PPO

## 2011-07-17 ENCOUNTER — Other Ambulatory Visit: Payer: Self-pay | Admitting: Oncology

## 2011-07-17 ENCOUNTER — Telehealth: Payer: Self-pay | Admitting: Medical Oncology

## 2011-07-17 ENCOUNTER — Other Ambulatory Visit (HOSPITAL_BASED_OUTPATIENT_CLINIC_OR_DEPARTMENT_OTHER): Payer: BC Managed Care – PPO | Admitting: Lab

## 2011-07-17 ENCOUNTER — Other Ambulatory Visit (HOSPITAL_COMMUNITY): Payer: Self-pay | Admitting: Oncology

## 2011-07-17 ENCOUNTER — Encounter (HOSPITAL_COMMUNITY)
Admission: RE | Admit: 2011-07-17 | Discharge: 2011-07-17 | Disposition: A | Discharge status: Home / Self Care | Payer: BC Managed Care – PPO | Source: Ambulatory Visit | Attending: Oncology | Admitting: Oncology

## 2011-07-17 DIAGNOSIS — G609 Hereditary and idiopathic neuropathy, unspecified: Secondary | ICD-10-CM | POA: Insufficient documentation

## 2011-07-17 DIAGNOSIS — C9000 Multiple myeloma not having achieved remission: Secondary | ICD-10-CM

## 2011-07-17 DIAGNOSIS — D649 Anemia, unspecified: Secondary | ICD-10-CM | POA: Insufficient documentation

## 2011-07-17 DIAGNOSIS — Z8546 Personal history of malignant neoplasm of prostate: Secondary | ICD-10-CM | POA: Insufficient documentation

## 2011-07-17 DIAGNOSIS — N179 Acute kidney failure, unspecified: Secondary | ICD-10-CM

## 2011-07-17 DIAGNOSIS — D696 Thrombocytopenia, unspecified: Secondary | ICD-10-CM

## 2011-07-17 DIAGNOSIS — E785 Hyperlipidemia, unspecified: Secondary | ICD-10-CM | POA: Insufficient documentation

## 2011-07-17 DIAGNOSIS — N289 Disorder of kidney and ureter, unspecified: Secondary | ICD-10-CM | POA: Insufficient documentation

## 2011-07-17 DIAGNOSIS — I10 Essential (primary) hypertension: Secondary | ICD-10-CM | POA: Insufficient documentation

## 2011-07-17 LAB — CBC WITH DIFFERENTIAL/PLATELET
HCT: 26.3 % — ABNORMAL LOW (ref 38.4–49.9)
HGB: 8.8 g/dL — ABNORMAL LOW (ref 13.0–17.1)
MCH: 29.7 pg (ref 27.2–33.4)
MCHC: 33.5 g/dL (ref 32.0–36.0)
MCV: 88.9 fL (ref 79.3–98.0)
Platelets: 22 10*3/uL — ABNORMAL LOW (ref 140–400)

## 2011-07-17 LAB — MANUAL DIFFERENTIAL
Basophil: 0 % (ref 0–2)
EOS: 3 % (ref 0–7)
MONO: 17 % — ABNORMAL HIGH (ref 0–14)
Myelocytes: 2 % — ABNORMAL HIGH (ref 0–0)
Other Cell: 13 % — ABNORMAL HIGH (ref 0–0)
PLT EST: DECREASED
SEG: 35 % — ABNORMAL LOW (ref 38–77)
nRBC: 2 % — ABNORMAL HIGH (ref 0–0)

## 2011-07-17 LAB — BASIC METABOLIC PANEL
BUN: 38 mg/dL — ABNORMAL HIGH (ref 6–23)
CO2: 23 mEq/L (ref 19–32)
Calcium: 9.6 mg/dL (ref 8.4–10.5)
Creatinine, Ser: 2.94 mg/dL — ABNORMAL HIGH (ref 0.50–1.35)
Glucose, Bld: 126 mg/dL — ABNORMAL HIGH (ref 70–99)
Sodium: 136 mEq/L (ref 135–145)

## 2011-07-17 MED ORDER — DEXAMETHASONE SODIUM PHOSPHATE 10 MG/ML IJ SOLN
40.0000 mg | Freq: Once | INTRAMUSCULAR | Status: AC
  Administered 2011-07-17: 40 mg via INTRAVENOUS

## 2011-07-17 MED ORDER — SODIUM CHLORIDE 0.9 % IV SOLN: Freq: Once | INTRAVENOUS | Status: DC

## 2011-07-17 MED ORDER — SODIUM CHLORIDE 0.9 % IV SOLN
100.0000 mg/m2 | Freq: Once | INTRAVENOUS | Status: AC
  Administered 2011-07-17: 220 mg via INTRAVENOUS
  Filled 2011-07-17: qty 11

## 2011-07-17 MED ORDER — SODIUM CHLORIDE 0.9 % IV SOLN: 8.0000 mg | Freq: Once | INTRAVENOUS | Status: DC

## 2011-07-17 MED ORDER — BORTEZOMIB CHEMO IV INJECTION 3.5 MG
1.3000 mg/m2 | Freq: Once | INTRAMUSCULAR | Status: AC
  Administered 2011-07-17: 2.9 mg via INTRAVENOUS
  Filled 2011-07-17: qty 2.9

## 2011-07-17 MED ORDER — ONDANSETRON 8 MG/50ML IVPB (CHCC)
8.0000 mg | Freq: Once | INTRAVENOUS | Status: AC
  Administered 2011-07-17: 8 mg via INTRAVENOUS

## 2011-07-17 NOTE — Telephone Encounter (Signed)
I called pt and left a message concerning his missed appointments. I asked him to call us to discuss. He missed his chemo class yesterday and lab/MD appts today.

## 2011-07-17 NOTE — Progress Notes (Signed)
CC:   Jeremiah Nora, MD [name], M.D. Jeremiah Purpura, MD  HISTORY:  I am seeing [name] today at the [facility] for the 1st time since his recent admission to the hospital from 07/03/2011 through 07/13/2011, during which he was diagnosed with kappa light chain multiple myeloma.  That admission was precipitated by a rather massive epistaxis and melena.  The patient was found to have severe anemia, thrombocytopenia, hypercalcemia and renal insufficiency. The patient had been in excellent health up until about 2 months ago. He started having back pain and was found to have a couple of mild compression fractures.  Toward the end of November in preparation for kyphoplasty, he was found to have abnormal labs.  The patient was admitted briefly to the hospital for primarily hypercalcemia and renal insufficiency, but requested an early discharge with workup to be continued as an outpatient.  In any event, he was readmitted to the hospital on 07/03/2011, and I saw him on 07/04/2011.  The patient was also seen by renal consultants.  It was our opinion that the patient most likely had light chain disease, and that turned out to be the case. A bone marrow exam carried out on 07/08/2011 showed an extensively replaced bone marrow with atypical immature mononuclear cells with plasmacytoid features.  Immunohistochemical stains showed that the cells were strongly positive for CD38 and cytoplasmic kappa light chains. Flow cytometry analysis confirmed the immunohistochemical findings with positivity for CD38 and cytoplasmic kappa light chains.  The plasma cells had plasma blastic features.  Circulating immature plasma cells were seen on the peripheral smear.  The bone marrow showed 90% to 100% cellularity.  Storage iron was present.  Additional features showed heavy kappa light chain excretion.  By one assay, there was 7800 mg of protein.  By another assay, free kappa  light chains were measured at 32 g per 24 hours.  There was a very small M spike in the serum 0.19%.  IgG level was 519, which is low, IgA 38, which is low, and IgM 12, which is low.  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  Additional findings were an LDH that was 641 on December 8th, a normal vitamin B12 level of 332.  Chest x-ray from 07/07/2011 showed stable chronic lung disease.  Metastatic bone survey from 07/04/2011 showed no lytic lesions.  The patient had mild superior endplate compression deformity of T12 and a mild superior endplate compression deformity of L3.  The patient was having moderately severe back pain for the few weeks leading up to admission.  A nuclear bone scan carried out on 07/06/2011 showed increased activity at the posterolateral aspect of the right 8th rib correlating with a healed or healing fracture that was seen on the chest x-ray of 07/04/2011.  There were no areas of abnormal activity in the spine, specifically, no abnormal activity at T12 or L3.  A renal ultrasound on 06/29/2011 showed no evidence of obstruction.  There was increased echogenicity of the renal parenchyma consistent with renal medical disease and bilateral renal stones.  There was prominent sludge in the gallbladder.  [name] received his 1st course of chemotherapy in the hospital on Friday, December 14th, consisting of Velcade 1.3 mg/m squared equal to the 2.75 mg subcutaneously, Cytoxan 100 mg/m squared equal to 220 mg IV and Decadron 40 mg given IV for 3 days.  The patient has been having blood work since his discharge from the hospital on Monday the 17th. His  CBC on 12/19 showed a white count of 10.4, hemoglobin 9.9, hematocrit 29.0, platelets 26,000.  I might add that the patient received multiple transfusions of platelets and red cells during his hospitalization.  Chemistries on 12/19 were notable for a BUN of 58, creatinine 3.20.  These have improved.  Calcium also  had improved and was 8.8 after being approximately 15 or 16 on admission.  I should also mention that uric acid was 11.0 on the 19th whereas it had been 15.4 on 12/14.  The patient is here with his significant other with whom he is now residing, [name].  The patient is here today for reassessment and chemotherapy.  We are hoping to treat him on a weekly basis.  Over the past several days since his discharge from the hospital on Monday the 17th, [name] seems to be doing fairly well.  His back pain is improved.  He is not taking any narcotics for this.  Although he is here today in a wheelchair,  I think he is getting around without any aids.  He walks slowly.  He has not been falling.  He is eating well without any GI problems.  He does have some mild constipation.  We talked about that.  The patient will use laxatives.  He denies any respiratory problems or swelling of his legs.  No history of confusion. He feels generally well.  There have been no bleeding or bruising problems.  No fever.  He has had some shaking, but these have not been febrile episodes.  The patient may need a Port-A-Cath because there have been some problems with venous access.  PROBLEM LIST: 1. Kappa light chain multiple myeloma, ISS III with diagnosis made in     December 2012.  The patient presents with poor prognosis disease,     specifically, anemia, thrombocytopenia, extensive bone marrow     replacement, immature plasma cells/plasma blasts and circulating     tumor cells, severe renal insufficiency, hypercalcemia and     apparently rather limited bone disease although he did have mild     compression fractures at T12 and L3.  I believe the patient's bone     density scan was normal.  Treatment with modified doses of Velcade,     Cytoxan and Decadron were started on 12/14.  It is our intention to     treat weekly and transfuse as needed. 2. History of prostate cancer dating back to September 2003  for which     he underwent robotic-assisted laparoscopic radical prostatectomy by     Dr. Heloise Boyer.  Tumor was found to be pathologic T3a involving     both lobes with capsular extension and positive margin focally.     The patient did not receive postoperative radiation. 3. Hypertension. 4. Dyslipidemia. 5. Peripheral sensory neuropathy. 6. Poor venous access.  MEDICINES:  Reviewed and recorded. 1. Acyclovir 800 mg twice a day. 2. Allopurinol 100 mg daily 3. Multivitamin. 4. Zofran as needed. 5. Protonix 40 mg daily. 6. Renvela 800 mg 3 times a day with meals. 7. Zocor 20 mg at bedtime. 8. Ambien 5 mg at bedtime.  ALLERGIES:  The patient does not appear to have true allergies, however, he has itching from Meloxicam, Percocet and Vicodin and nausea or Celebrex.  PHYSICAL EXAM:  General:  The patient is in good spirits in a wheelchair.  He is in no acute distress at the present time.  His weight is 201 pounds, height 6  feet, 4 inches, body surface area 2.21 m squared.  Vital Signs:  Blood pressure 143/69.  Temperature is 97.1, pulse 95 and regular, respirations regular and unlabored.  HEENT:  There is no scalp alopecia.  No scleral icterus.  Mouth and pharynx are benign.  He has an upper plate and a partial lower plate.  Lymphatics: No adenopathy.  Lungs:  Clear to percussion auscultation.  Cardiac: Regular rhythm without murmur or rub.  Back:  No skeletal tenderness. Abdomen:  With the patient sitting, it is benign with no obvious organomegaly or masses palpable.  There is some resistance in the right upper quadrant.  Extremities:  No peripheral edema, clubbing, palmar erythema, petechiae, or Boyer.  Skin:  Dry.  Neurologic:  Exam is grossly normal.  LABORATORY DATA:  Today white count 9.5, ANC 8.8, hemoglobin 26.3, platelets 22,000.  ANC is 3.7.  Differential is quite abnormal with  35% segs, 28% lymphocytes 17% monocytes, 2% metamyelocytes, 2% myelocytes, 13%  abnormal mononuclear cells, some of which looked plasma blastoid and 2% nucleated red cells.  Chemistries today:  Sodium 136, potassium 3.7, BUN 38, creatinine 2.94, calcium 9.6, glucose 126 and uric acid 9.6.  In general, the BUN, creatinine and uric acid have all improved.  The calcium, after being initially elevated at about 15-16, is now in the normal range.  IMPRESSION AND PLAN:  Clearly Mr. Ingham has poor prognosis multiple myeloma, [employer] III.  As stated above, he has extensive replacement of the bone marrow with immature plasma cells, some of which are circulating, transfusion dependent anemia and thrombocytopenia, and severe renal insufficiency.  Fortunately, his renal function seems to be improving.  He also has considerable protein excretion in the urine.  Today the patient will receive a 2nd dose of chemotherapy as follows Velcade 2.9 mg subcutaneously, Cytoxan 220 mg IV and Decadron 40 mg IV.  In addition, tomorrow, given the weekend and Christmas on Tuesday, we are going to go ahead and give the patient 1 unit of packed red cells and 1 bag of pheresed platelets.  We are hoping that at some point, with improvement of his myeloma, that the patient may not require transfusions, but for now, we are supporting him.  It is our plan to continue with the current treatment program.  If the patient shows some response, we will probably try to refer him to either [facility] or [facility] for evaluation and consideration of possible high-dose therapy and stem cell rescue.  The patient is 65 years old.  We gave Mr. Vigo a prescription for Ultram 50 mg to take 3-4 times a day as needed.  He is having some pain, but his pain seems to be markedly improved.  The patient was cautioned about bleeding, bruising and infection.  The patient and his significant other, [name], understand that should any questions or concerns arise, especially if any symptoms that suggest  infection or bleeding problems arise, that the patient needs to notify us immediately or proceed directly to the emergency room.  We will continue to monitor him very carefully Mondays, Wednesdays and Fridays with a CBC, chemistries and a uric acid.  We will transfuse as Necessary.  We will check him for IgD and IgE.  He has been set up for chemotherapy every Friday,specifically December 28th, January 4th, and on January 11th.  I wouldlike to see the patient again on January 11th.  We     will be checking the urine protein to creatinine ratio and  probably another 24-hour urine protein in the weeks ahead.    ______________________________ Samul Dada, M.D. DSM/MEDQ  D:  07/17/2011  T:  07/17/2011  Job:  [id number]

## 2011-07-17 NOTE — Progress Notes (Signed)
This office note has been dictated.  #[id number]

## 2011-07-17 NOTE — Patient Instructions (Signed)
Pt out of dept with significant other via wheelchair.  Pt has appt for tomorrow for blood transfusion. dph

## 2011-07-18 ENCOUNTER — Encounter (HOSPITAL_COMMUNITY): Payer: BC Managed Care – PPO

## 2011-07-18 ENCOUNTER — Ambulatory Visit (HOSPITAL_BASED_OUTPATIENT_CLINIC_OR_DEPARTMENT_OTHER): Payer: BC Managed Care – PPO

## 2011-07-18 DIAGNOSIS — D696 Thrombocytopenia, unspecified: Secondary | ICD-10-CM

## 2011-07-18 DIAGNOSIS — C9000 Multiple myeloma not having achieved remission: Secondary | ICD-10-CM

## 2011-07-18 MED ORDER — ACETAMINOPHEN 325 MG PO TABS
650.0000 mg | ORAL_TABLET | Freq: Once | ORAL | Status: AC
  Administered 2011-07-18: 650 mg via ORAL

## 2011-07-18 MED ORDER — DIPHENHYDRAMINE HCL 50 MG/ML IJ SOLN
25.0000 mg | Freq: Once | INTRAMUSCULAR | Status: AC
  Administered 2011-07-18: 25 mg via INTRAVENOUS

## 2011-07-18 NOTE — Progress Notes (Signed)
Platelets donor number [id number]  0 positive double checked by Raliegh Ip, RN and started at [phone] at 50 ml/hr .vol 12.5 x 15 minutes VSS  1013-VSS rate increased to 700 ml hr for duration .  1040 -platelets completed without problems -Tolerated platelets well.VSS  1 UPRBC donor number -[id number] 0 positive and pt 0 positive& Leucocyte reduced double checked by Raliegh Ip, RN   1055 blood started  at rate 50 ml and vol 12.5 ml x 15 minutes via patent iv in r hand.  1110-vss rate increased to 185 ml/hr ,vol 185 l x 1 hour 1210-VSS rate continues at 185 ml/hr for duration  1240 VSS blood completed without problems Pt instructed to call for any concerns and voices understanding

## 2011-07-19 LAB — PREPARE PLATELET PHERESIS

## 2011-07-20 ENCOUNTER — Other Ambulatory Visit (HOSPITAL_COMMUNITY): Payer: Self-pay | Admitting: Oncology

## 2011-07-20 ENCOUNTER — Telehealth: Payer: Self-pay | Admitting: Oncology

## 2011-07-20 ENCOUNTER — Other Ambulatory Visit (HOSPITAL_BASED_OUTPATIENT_CLINIC_OR_DEPARTMENT_OTHER): Payer: BC Managed Care – PPO | Admitting: Lab

## 2011-07-20 ENCOUNTER — Telehealth: Payer: Self-pay | Admitting: *Deleted

## 2011-07-20 DIAGNOSIS — N179 Acute kidney failure, unspecified: Secondary | ICD-10-CM

## 2011-07-20 DIAGNOSIS — C9000 Multiple myeloma not having achieved remission: Secondary | ICD-10-CM

## 2011-07-20 LAB — COMPREHENSIVE METABOLIC PANEL
ALT: 46 U/L (ref 0–53)
BUN: 52 mg/dL — ABNORMAL HIGH (ref 6–23)
CO2: 23 mEq/L (ref 19–32)
Calcium: 9.1 mg/dL (ref 8.4–10.5)
Chloride: 103 mEq/L (ref 96–112)
Creatinine, Ser: 2.67 mg/dL — ABNORMAL HIGH (ref 0.50–1.35)
Glucose, Bld: 85 mg/dL (ref 70–99)
Total Bilirubin: 0.4 mg/dL (ref 0.3–1.2)

## 2011-07-20 LAB — PROTEIN / CREATININE RATIO, URINE
Creatinine, Urine: 62 mg/dL
Protein Creatinine Ratio: 1.18 — ABNORMAL HIGH (ref <0.15)
Total Protein, Urine: 73 mg/dL

## 2011-07-20 LAB — CBC WITH DIFFERENTIAL/PLATELET
BASO%: 0.4 % (ref 0.0–2.0)
EOS%: 0.8 % (ref 0.0–7.0)
HGB: 9.7 g/dL — ABNORMAL LOW (ref 13.0–17.1)
MCH: 31.3 pg (ref 27.2–33.4)
MCHC: 34.9 g/dL (ref 32.0–36.0)
MONO#: 0.7 10*3/uL (ref 0.1–0.9)
RDW: 16 % — ABNORMAL HIGH (ref 11.0–14.6)
WBC: 6.1 10*3/uL (ref 4.0–10.3)
lymph#: 1 10*3/uL (ref 0.9–3.3)

## 2011-07-20 NOTE — Telephone Encounter (Signed)
Was working on pt from Friday.  Marcelino Duster completed task by setting up [place] appt and rx for 1/11                          aom

## 2011-07-20 NOTE — Telephone Encounter (Signed)
Message left requesting a return call from patient.  Encouraged to drink lots of water.

## 2011-07-20 NOTE — Telephone Encounter (Signed)
Message copied by Augusto Garbe on Mon Jul 20, 2011 11:02 AM ------      Message from: [place], [place] P      Created: Fri Jul 17, 2011  6:03 PM      Regarding: chemo f/u       Dr Arline Asp - 1st IV Velcade & Cytoxan on Friday dph

## 2011-07-21 LAB — TYPE AND SCREEN
Antibody Screen: NEGATIVE
Unit division: 0

## 2011-07-22 ENCOUNTER — Other Ambulatory Visit: Payer: Self-pay | Admitting: Oncology

## 2011-07-22 ENCOUNTER — Telehealth: Payer: Self-pay | Admitting: Medical Oncology

## 2011-07-22 ENCOUNTER — Other Ambulatory Visit (HOSPITAL_BASED_OUTPATIENT_CLINIC_OR_DEPARTMENT_OTHER): Payer: BC Managed Care – PPO | Admitting: Lab

## 2011-07-22 DIAGNOSIS — N179 Acute kidney failure, unspecified: Secondary | ICD-10-CM

## 2011-07-22 DIAGNOSIS — C9000 Multiple myeloma not having achieved remission: Secondary | ICD-10-CM

## 2011-07-22 LAB — CBC WITH DIFFERENTIAL/PLATELET
BASO%: 0.3 % (ref 0.0–2.0)
Eosinophils Absolute: 0.2 10*3/uL (ref 0.0–0.5)
HCT: 27.1 % — ABNORMAL LOW (ref 38.4–49.9)
LYMPH%: 15.8 % (ref 14.0–49.0)
MCHC: 34.6 g/dL (ref 32.0–36.0)
MONO#: 0.7 10*3/uL (ref 0.1–0.9)
NEUT#: 3.3 10*3/uL (ref 1.5–6.5)
NEUT%: 66.1 % (ref 39.0–75.0)
Platelets: 57 10*3/uL — ABNORMAL LOW (ref 140–400)
WBC: 4.9 10*3/uL (ref 4.0–10.3)
lymph#: 0.8 10*3/uL — ABNORMAL LOW (ref 0.9–3.3)

## 2011-07-22 LAB — BASIC METABOLIC PANEL
CO2: 25 mEq/L (ref 19–32)
Chloride: 101 mEq/L (ref 96–112)
Creatinine, Ser: 2.64 mg/dL — ABNORMAL HIGH (ref 0.50–1.35)
Glucose, Bld: 100 mg/dL — ABNORMAL HIGH (ref 70–99)
Sodium: 134 mEq/L — ABNORMAL LOW (ref 135–145)

## 2011-07-22 LAB — URIC ACID: Uric Acid, Serum: 7.6 mg/dL (ref 4.0–7.8)

## 2011-07-22 NOTE — Telephone Encounter (Signed)
I called and left pt a message per Dr. Arline Asp that his uric acid was 9.4 07/20/11. He would like for him to increase his allopurinol from 100mg  daily to 150mg  daily. I asked him to call me back to verify he did receive this message.

## 2011-07-24 ENCOUNTER — Ambulatory Visit (HOSPITAL_BASED_OUTPATIENT_CLINIC_OR_DEPARTMENT_OTHER): Payer: BC Managed Care – PPO

## 2011-07-24 ENCOUNTER — Other Ambulatory Visit (HOSPITAL_BASED_OUTPATIENT_CLINIC_OR_DEPARTMENT_OTHER): Payer: BC Managed Care – PPO | Admitting: Lab

## 2011-07-24 DIAGNOSIS — C9000 Multiple myeloma not having achieved remission: Secondary | ICD-10-CM

## 2011-07-24 DIAGNOSIS — N179 Acute kidney failure, unspecified: Secondary | ICD-10-CM

## 2011-07-24 LAB — CBC WITH DIFFERENTIAL/PLATELET
BASO%: 1.5 % (ref 0.0–2.0)
Basophils Absolute: 0.1 10*3/uL (ref 0.0–0.1)
EOS%: 4.4 % (ref 0.0–7.0)
HCT: 27.3 % — ABNORMAL LOW (ref 38.4–49.9)
HGB: 8.9 g/dL — ABNORMAL LOW (ref 13.0–17.1)
LYMPH%: 16.2 % (ref 14.0–49.0)
MCH: 29.3 pg (ref 27.2–33.4)
MCHC: 32.6 g/dL (ref 32.0–36.0)
MCV: 89.8 fL (ref 79.3–98.0)
NEUT%: 64.2 % (ref 39.0–75.0)
Platelets: 77 10*3/uL — ABNORMAL LOW (ref 140–400)
lymph#: 0.8 10*3/uL — ABNORMAL LOW (ref 0.9–3.3)

## 2011-07-24 LAB — BASIC METABOLIC PANEL
BUN: 35 mg/dL — ABNORMAL HIGH (ref 6–23)
Calcium: 9.8 mg/dL (ref 8.4–10.5)
Chloride: 103 mEq/L (ref 96–112)
Creatinine, Ser: 2.39 mg/dL — ABNORMAL HIGH (ref 0.50–1.35)

## 2011-07-24 LAB — KAPPA/LAMBDA LIGHT CHAINS
Kappa free light chain: 397 mg/dL — ABNORMAL HIGH (ref 0.33–1.94)
Lambda Free Lght Chn: 0.21 mg/dL — ABNORMAL LOW (ref 0.57–2.63)

## 2011-07-24 LAB — IMMUNOFIXATION ELECTROPHORESIS
IgA: 43 mg/dL — ABNORMAL LOW (ref 68–379)
IgG (Immunoglobin G), Serum: 508 mg/dL — ABNORMAL LOW (ref 650–1600)
IgM, Serum: 25 mg/dL — ABNORMAL LOW (ref 41–251)

## 2011-07-24 LAB — TECHNOLOGIST REVIEW

## 2011-07-24 MED ORDER — ONDANSETRON 8 MG/50ML IVPB (CHCC)
8.0000 mg | Freq: Once | INTRAVENOUS | Status: AC
  Administered 2011-07-24: 8 mg via INTRAVENOUS

## 2011-07-24 MED ORDER — SODIUM CHLORIDE 0.9 % IV SOLN
Freq: Once | INTRAVENOUS | Status: AC
  Administered 2011-07-24: 11:00:00 via INTRAVENOUS

## 2011-07-24 MED ORDER — CYCLOPHOSPHAMIDE CHEMO INJECTION 1 GM
100.0000 mg/m2 | Freq: Once | INTRAMUSCULAR | Status: AC
  Administered 2011-07-24: 220 mg via INTRAVENOUS
  Filled 2011-07-24: qty 11

## 2011-07-24 MED ORDER — BORTEZOMIB CHEMO IV INJECTION 3.5 MG
1.3000 mg/m2 | Freq: Once | INTRAMUSCULAR | Status: AC
  Administered 2011-07-24: 2.9 mg via INTRAVENOUS
  Filled 2011-07-24: qty 2.9

## 2011-07-24 MED ORDER — SODIUM CHLORIDE 0.9 % IV SOLN: 8.0000 mg | Freq: Once | INTRAVENOUS | Status: DC

## 2011-07-24 MED ORDER — DEXAMETHASONE SODIUM PHOSPHATE 10 MG/ML IJ SOLN
40.0000 mg | Freq: Once | INTRAMUSCULAR | Status: AC
  Administered 2011-07-24: 40 mg via INTRAVENOUS

## 2011-07-24 NOTE — Patient Instructions (Signed)
Pt out of dept via w/c with friend. Aware of appts for next week. dph

## 2011-07-27 ENCOUNTER — Other Ambulatory Visit (HOSPITAL_BASED_OUTPATIENT_CLINIC_OR_DEPARTMENT_OTHER): Payer: BC Managed Care – PPO

## 2011-07-27 ENCOUNTER — Other Ambulatory Visit: Payer: Self-pay

## 2011-07-27 DIAGNOSIS — C9000 Multiple myeloma not having achieved remission: Secondary | ICD-10-CM

## 2011-07-27 DIAGNOSIS — N179 Acute kidney failure, unspecified: Secondary | ICD-10-CM

## 2011-07-27 LAB — UIFE/LIGHT CHAINS/TP QN, 24-HR UR
Alpha 1, Urine: DETECTED — AB
Alpha 2, Urine: DETECTED — AB
Beta, Urine: DETECTED — AB
Free Kappa Lt Chains,Ur: 2240 mg/dL — ABNORMAL HIGH (ref 0.14–2.42)
Free Kappa/Lambda Ratio: 11200 ratio — ABNORMAL HIGH (ref 2.04–10.37)
Free Lt Chn Excr Rate: 56000 mg/d
Gamma Globulin, Urine: DETECTED — AB
Total Protein, Urine: 2241 mg/dL

## 2011-07-27 LAB — CBC WITH DIFFERENTIAL/PLATELET
BASO%: 1.4 % (ref 0.0–2.0)
Basophils Absolute: 0.1 10*3/uL (ref 0.0–0.1)
EOS%: 2.3 % (ref 0.0–7.0)
HGB: 9.2 g/dL — ABNORMAL LOW (ref 13.0–17.1)
MCH: 31.3 pg (ref 27.2–33.4)
MCHC: 34.8 g/dL (ref 32.0–36.0)
MCV: 89.7 fL (ref 79.3–98.0)
MONO%: 11 % (ref 0.0–14.0)
NEUT%: 71.1 % (ref 39.0–75.0)
RDW: 16.1 % — ABNORMAL HIGH (ref 11.0–14.6)

## 2011-07-27 LAB — BASIC METABOLIC PANEL
BUN: 42 mg/dL — ABNORMAL HIGH (ref 6–23)
Creatinine, Ser: 2.31 mg/dL — ABNORMAL HIGH (ref 0.50–1.35)
Potassium: 3.8 mEq/L (ref 3.5–5.3)

## 2011-07-27 LAB — URIC ACID: Uric Acid, Serum: 7.1 mg/dL (ref 4.0–7.8)

## 2011-07-29 ENCOUNTER — Telehealth: Payer: Self-pay

## 2011-07-29 ENCOUNTER — Other Ambulatory Visit: Payer: BC Managed Care – PPO | Admitting: Lab

## 2011-07-29 ENCOUNTER — Other Ambulatory Visit: Payer: Self-pay

## 2011-07-29 ENCOUNTER — Other Ambulatory Visit: Payer: Self-pay | Admitting: Oncology

## 2011-07-29 DIAGNOSIS — C61 Malignant neoplasm of prostate: Secondary | ICD-10-CM

## 2011-07-29 MED ORDER — TRAMADOL HCL 50 MG PO TABS: 50.0000 mg | ORAL_TABLET | ORAL | Status: DC | PRN

## 2011-07-29 NOTE — Telephone Encounter (Signed)
Called pt to get info about his headaches. He states he feels they are sinus HA. Pressure on his eyeballs and forehead, no fever, minimal drainage. He wakes up w/headache and it eases off during the day. No HA at time of this call.

## 2011-07-29 NOTE — Telephone Encounter (Signed)
Replying to earlier call from [facility]: per DSM it is OK for pt to take OTC sinus meds and if the sinus headaches do not clear up for pt to see his PCP. Confirmed that to prior call was from IR for port placement. Penny expressed understanding.

## 2011-07-30 ENCOUNTER — Telehealth: Payer: Self-pay | Admitting: Oncology

## 2011-07-30 NOTE — Telephone Encounter (Signed)
S/w the pt regarding his cancelled laB  appt on 08/03/2011

## 2011-07-31 ENCOUNTER — Ambulatory Visit (HOSPITAL_BASED_OUTPATIENT_CLINIC_OR_DEPARTMENT_OTHER): Payer: Medicare Other

## 2011-07-31 ENCOUNTER — Ambulatory Visit: Payer: Medicare Other | Admitting: Nutrition

## 2011-07-31 ENCOUNTER — Other Ambulatory Visit: Payer: Self-pay | Admitting: Medical Oncology

## 2011-07-31 ENCOUNTER — Encounter: Payer: Self-pay | Admitting: Medical Oncology

## 2011-07-31 ENCOUNTER — Other Ambulatory Visit (HOSPITAL_BASED_OUTPATIENT_CLINIC_OR_DEPARTMENT_OTHER): Payer: Medicare Other | Admitting: Lab

## 2011-07-31 DIAGNOSIS — C9000 Multiple myeloma not having achieved remission: Secondary | ICD-10-CM

## 2011-07-31 DIAGNOSIS — Z5111 Encounter for antineoplastic chemotherapy: Secondary | ICD-10-CM

## 2011-07-31 DIAGNOSIS — N19 Unspecified kidney failure: Secondary | ICD-10-CM

## 2011-07-31 LAB — CBC WITH DIFFERENTIAL/PLATELET
BASO%: 1.6 % (ref 0.0–2.0)
MCHC: 33.2 g/dL (ref 32.0–36.0)
MONO#: 1 10*3/uL — ABNORMAL HIGH (ref 0.1–0.9)
RBC: 3.06 10*6/uL — ABNORMAL LOW (ref 4.20–5.82)
WBC: 6.2 10*3/uL (ref 4.0–10.3)
lymph#: 0.7 10*3/uL — ABNORMAL LOW (ref 0.9–3.3)
nRBC: 0 % (ref 0–0)

## 2011-07-31 LAB — BASIC METABOLIC PANEL
Chloride: 103 mEq/L (ref 96–112)
Potassium: 4.2 mEq/L (ref 3.5–5.3)
Sodium: 138 mEq/L (ref 135–145)

## 2011-07-31 MED ORDER — HEPARIN SOD (PORK) LOCK FLUSH 100 UNIT/ML IV SOLN
500.0000 [IU] | Freq: Once | INTRAVENOUS | Status: DC | PRN
  Filled 2011-07-31: qty 5

## 2011-07-31 MED ORDER — ALTEPLASE 2 MG IJ SOLR
2.0000 mg | Freq: Once | INTRAMUSCULAR | Status: DC | PRN
  Filled 2011-07-31: qty 2

## 2011-07-31 MED ORDER — SODIUM CHLORIDE 0.9 % IJ SOLN
3.0000 mL | INTRAMUSCULAR | Status: DC | PRN
  Filled 2011-07-31: qty 10

## 2011-07-31 MED ORDER — HEPARIN SOD (PORK) LOCK FLUSH 100 UNIT/ML IV SOLN
250.0000 [IU] | Freq: Once | INTRAVENOUS | Status: DC | PRN
  Filled 2011-07-31: qty 5

## 2011-07-31 MED ORDER — SODIUM CHLORIDE 0.9 % IJ SOLN
10.0000 mL | INTRAMUSCULAR | Status: DC | PRN
  Filled 2011-07-31: qty 10

## 2011-07-31 MED ORDER — SODIUM CHLORIDE 0.9 % IV SOLN
Freq: Once | INTRAVENOUS | Status: AC
  Administered 2011-07-31: 12:00:00 via INTRAVENOUS

## 2011-07-31 MED ORDER — ONDANSETRON 8 MG/50ML IVPB (CHCC): 8.0000 mg | Freq: Once | INTRAVENOUS | Status: DC

## 2011-07-31 MED ORDER — DEXAMETHASONE SODIUM PHOSPHATE 10 MG/ML IJ SOLN
40.0000 mg | Freq: Once | INTRAMUSCULAR | Status: AC
  Administered 2011-07-31: 40 mg via INTRAVENOUS

## 2011-07-31 MED ORDER — BORTEZOMIB CHEMO IV INJECTION 3.5 MG
1.3000 mg/m2 | Freq: Once | INTRAMUSCULAR | Status: AC
  Administered 2011-07-31: 2.9 mg via INTRAVENOUS
  Filled 2011-07-31: qty 2.9

## 2011-07-31 MED ORDER — ALLOPURINOL 100 MG PO TABS: 100.0000 mg | ORAL_TABLET | Freq: Every day | ORAL | Status: DC

## 2011-07-31 MED ORDER — SODIUM CHLORIDE 0.9 % IV SOLN
8.0000 mg | Freq: Once | INTRAVENOUS | Status: DC
  Administered 2011-07-31: 8 mg via INTRAVENOUS

## 2011-07-31 MED ORDER — SODIUM CHLORIDE 0.9 % IV SOLN
100.0000 mg/m2 | Freq: Once | INTRAVENOUS | Status: AC
  Administered 2011-07-31: 220 mg via INTRAVENOUS
  Filled 2011-07-31: qty 11

## 2011-07-31 NOTE — Assessment & Plan Note (Signed)
[name] stopped me while I was in the chemotherapy area and had some questions.  This is a 66 year old male patient of Dr. Arline Asp who has been diagnosed with kappa light chain multiple myeloma.  HISTORY:  Includes hypertriglyceridemia, obesity, vertigo, hypertension, rectal mass, anemia, acute renal failure, and malignant neoplasm of the prostate.  MEDICATIONS:  Include multivitamin, Protonix, and Zocor.  LABORATORIES:  Include glucose of 109, BUN of 42, creatinine of 2.31.  HEIGHT:  64 inches. WEIGHT:  201.4 pounds, documented December 21st. BMI:  24.5.  The patient reports weight loss over time.  He reports he had a hospital admission where he had high blood levels of calcium.  He was concerned that he should be avoiding calcium foods.  He also complains of constipation.  NUTRITION DIAGNOSIS:  Food and nutrition-related knowledge deficit related to diagnosis of multiple myeloma and associated treatments as evidenced by no prior need for nutrition-related information.  INTERVENTION:  I have educated the patient on strategies for dealing with constipation as well as small frequent meals with high-calorie, high-protein foods to avoid further weight loss.  We have discussed calcium-rich foods.  I have educated him that it is not imperative that he consume a diet without calcium foods.  I have recommended that he does not consume calcium supplements unless advised by his physician, however, it would be fine for him to include dairy products in his diet.  He confirms that his physician has not told him to avoid dairy products for any reason, but he was interested in information.  I have provided him with fact sheets and my contact information for further questions.  MONITORING/EVALUATION (GOALS):  That patient will tolerate oral intake to minimize weight loss throughout treatment.  NEXT VISIT:  I have not scheduled a followup.  The patient will call me with questions or  concerns.    ______________________________ Zenovia Jarred, RD, LDN Clinical Nutrition Specialist BN/MEDQ  D:  07/31/2011  T:  07/31/2011  Job:  [id number]

## 2011-08-03 ENCOUNTER — Other Ambulatory Visit: Payer: BC Managed Care – PPO | Admitting: Lab

## 2011-08-03 ENCOUNTER — Other Ambulatory Visit: Payer: Self-pay | Admitting: Radiology

## 2011-08-04 ENCOUNTER — Other Ambulatory Visit: Payer: Self-pay | Admitting: Medical Oncology

## 2011-08-04 ENCOUNTER — Telehealth: Payer: Self-pay | Admitting: Medical Oncology

## 2011-08-04 NOTE — Telephone Encounter (Signed)
[name] called stating that they would like to get his medicine refills sent to [facility]. She had to set up an account with them and she wanted me to know it is done. Will check on the refills and call these in.

## 2011-08-05 ENCOUNTER — Other Ambulatory Visit (HOSPITAL_COMMUNITY): Payer: Self-pay | Admitting: Oncology

## 2011-08-05 ENCOUNTER — Other Ambulatory Visit: Payer: Self-pay | Admitting: Medical Oncology

## 2011-08-05 ENCOUNTER — Ambulatory Visit (HOSPITAL_COMMUNITY)
Admission: RE | Admit: 2011-08-05 | Discharge: 2011-08-05 | Disposition: A | Discharge status: Home / Self Care | Payer: Medicare Other | Source: Ambulatory Visit | Attending: Oncology | Admitting: Oncology

## 2011-08-05 ENCOUNTER — Ambulatory Visit (HOSPITAL_BASED_OUTPATIENT_CLINIC_OR_DEPARTMENT_OTHER): Payer: Medicare Other | Admitting: Oncology

## 2011-08-05 ENCOUNTER — Other Ambulatory Visit (HOSPITAL_BASED_OUTPATIENT_CLINIC_OR_DEPARTMENT_OTHER): Payer: Medicare Other | Admitting: Lab

## 2011-08-05 ENCOUNTER — Other Ambulatory Visit: Payer: Self-pay | Admitting: Oncology

## 2011-08-05 ENCOUNTER — Inpatient Hospital Stay (HOSPITAL_COMMUNITY): Admission: RE | Admit: 2011-08-05 | Payer: BC Managed Care – PPO | Source: Ambulatory Visit

## 2011-08-05 ENCOUNTER — Encounter: Payer: Self-pay | Admitting: Oncology

## 2011-08-05 DIAGNOSIS — Z79899 Other long term (current) drug therapy: Secondary | ICD-10-CM | POA: Insufficient documentation

## 2011-08-05 DIAGNOSIS — C9000 Multiple myeloma not having achieved remission: Secondary | ICD-10-CM

## 2011-08-05 DIAGNOSIS — I10 Essential (primary) hypertension: Secondary | ICD-10-CM | POA: Insufficient documentation

## 2011-08-05 DIAGNOSIS — Z95828 Presence of other vascular implants and grafts: Secondary | ICD-10-CM

## 2011-08-05 DIAGNOSIS — G608 Other hereditary and idiopathic neuropathies: Secondary | ICD-10-CM | POA: Insufficient documentation

## 2011-08-05 DIAGNOSIS — E785 Hyperlipidemia, unspecified: Secondary | ICD-10-CM | POA: Insufficient documentation

## 2011-08-05 HISTORY — PX: PORTACATH PLACEMENT: SHX2246

## 2011-08-05 LAB — CBC WITH DIFFERENTIAL/PLATELET
BASO%: 0.6 % (ref 0.0–2.0)
Basophils Absolute: 0 10*3/uL (ref 0.0–0.1)
EOS%: 4.1 % (ref 0.0–7.0)
HCT: 27.4 % — ABNORMAL LOW (ref 38.4–49.9)
HGB: 9.4 g/dL — ABNORMAL LOW (ref 13.0–17.1)
MCH: 31.3 pg (ref 27.2–33.4)
MCHC: 34.3 g/dL (ref 32.0–36.0)
MCV: 91.4 fL (ref 79.3–98.0)
MONO%: 7.4 % (ref 0.0–14.0)
NEUT%: 76.5 % — ABNORMAL HIGH (ref 39.0–75.0)
RDW: 16.4 % — ABNORMAL HIGH (ref 11.0–14.6)

## 2011-08-05 LAB — COMPREHENSIVE METABOLIC PANEL
ALT: 20 U/L (ref 0–53)
AST: 16 U/L (ref 0–37)
Alkaline Phosphatase: 222 U/L — ABNORMAL HIGH (ref 39–117)
BUN: 22 mg/dL (ref 6–23)
Creatinine, Ser: 1.77 mg/dL — ABNORMAL HIGH (ref 0.50–1.35)

## 2011-08-05 LAB — CBC
MCH: 30.1 pg (ref 26.0–34.0)
MCHC: 32.9 g/dL (ref 30.0–36.0)
Platelets: 200 10*3/uL (ref 150–400)
RDW: 16.4 % — ABNORMAL HIGH (ref 11.5–15.5)

## 2011-08-05 LAB — LACTATE DEHYDROGENASE: LDH: 157 U/L (ref 94–250)

## 2011-08-05 LAB — HOLD TUBE, BLOOD BANK

## 2011-08-05 MED ORDER — HEPARIN (PORCINE) LOCK FLUSH 10 UNIT/ML IV SOLN
5.0000 [IU] | Freq: Once | INTRAVENOUS | Status: AC
  Administered 2011-08-05: 5 [IU]

## 2011-08-05 MED ORDER — MIDAZOLAM HCL 5 MG/5ML IJ SOLN
INTRAMUSCULAR | Status: AC | PRN
  Administered 2011-08-05: 2 mg via INTRAVENOUS

## 2011-08-05 MED ORDER — SODIUM CHLORIDE 0.9 % IV SOLN
INTRAVENOUS | Status: DC
  Administered 2011-08-05: 12:00:00 via INTRAVENOUS

## 2011-08-05 MED ORDER — CEFAZOLIN SODIUM 1-5 GM-% IV SOLN
1.0000 g | INTRAVENOUS | Status: AC
  Administered 2011-08-05: 1 g via INTRAVENOUS
  Filled 2011-08-05: qty 50

## 2011-08-05 MED ORDER — LIDOCAINE HCL 1 % IJ SOLN
INTRAMUSCULAR | Status: AC
  Filled 2011-08-05: qty 20

## 2011-08-05 MED ORDER — FENTANYL CITRATE 0.05 MG/ML IJ SOLN
INTRAMUSCULAR | Status: AC | PRN
  Administered 2011-08-05 (×2): 100 ug via INTRAVENOUS

## 2011-08-05 MED ORDER — LIDOCAINE-PRILOCAINE 2.5-2.5 % EX CREA: TOPICAL_CREAM | CUTANEOUS | Status: DC | PRN

## 2011-08-05 NOTE — Progress Notes (Signed)
This office note has been dictated.  #[id number]

## 2011-08-05 NOTE — Procedures (Signed)
[place] and Fluoro RT IJ power PAC Tip svc/ra No comp Ready to use Full rad report in PACS

## 2011-08-05 NOTE — Progress Notes (Signed)
CC:   [name], MD [name], M.D. [name], MD  HISTORY:  [name] was seen today for followup of his multiple myeloma, previous pancytopenia, renal insufficiency, and hypercalcemia. The patient was last seen by [place] on 07/17/2011.  He has light chain multiple myeloma.  His bone marrow exam carried out on 07/08/2011 showed extensive replacement of his bone marrow with plasma blasts.  He had circulating plasma cells.  [name] is here today with his significant other, Romeo Rabon. Since his last visit here, [name] has done quite well.  It will be recalled that we had started his chemotherapy consisting of Velcade, Cytoxan, and Decadron while he was hospitalized.  The first treatment was given on 07/10/2011.  He tolerated his treatment well.  Since then, he has received weekly doses of chemotherapy, most recently on 07/31/2011, 07/24/2011, and 07/17/2011 at the [facility]. [name] had poor venous access and required placement of a Port-A- Cath, which was carried out today.  That Port-A-Cath is on the right side.  The patient has tolerated his chemotherapy well.  We have seen a gradual but significant improvement in his blood counts and also his renal function.  Clinically, the patient is feeling well with good appetite.  His only real complaint is low back discomfort and also some pain in his groin areas when he attempts to walk.  This is more prominent first thing in the morning.  He uses a walker without  wheels.  He is getting by with Ultram and Tylenol.  We have not had any bleeding or bruising problems.  We have not had any problems with infection or fever.  The patient has not required any blood transfusions since his hospitalization.  As stated, he has had a dramatic improvement, particularly in his platelet count, which today is 200,000 (up from below 20,000 when he was hospitalized with epistaxis and melena).  PROBLEM  LIST: 1. Kappa light chain multiple myeloma, ISS III, with diagnosis made in     December 2012.  The patient presented with a poor prognosis,     anemia, severe thrombocytopenia, and extensive bone marrow (07/08/11)     replacement with immature plasma cells/plasma blasts and     circulating plasma cells in the peripheral blood.  In addition, he     had severe renal insufficiency and hypercalcemia and was requiring     transfusions of platelets and red cells.  He has limited bone     disease, specifically mild compression fractures at T12 and L3.  A     bone density scan done in the past couple months was normal.  The     is receiving Velcade, Cytoxan, and Decadron on a weekly basis.     These treatments were started on 07/10/2011. 2. Pancytopenia. 3. Renal insufficiency. 4. Hypercalcemia. 5. History of prostate cancer dating back to September 2003 treated     with robotic-assisted laparoscopic radical prostatectomy by Dr.     [name].  The tumor was a pathologic T3a, involving both     lobes of the prostate with capsular extension and positive margin     focally.  The patient did not receive postoperative radiation. 6. Hypertension. 7. Dyslipidemia. 8. Peripheral sensory neuropathy. 9. Right-sided Port-A-Cath placement on 08/05/2011.  MEDICATIONS: 1. Acyclovir 800 mg twice a day. 2. Allopurinol 150 mg daily. 3. EMLA cream. 4. Multivitamins. 5. Protonix. 6. Renvela 800 mg three times a day with meals. 7. Zocor 20  mg at bedtime. 8. Ultram 50 mg every 4 hours as needed for pain. 9. Ambien 5 mg at bedtime as needed. 10.Tylenol 650 mg twice a day.  PHYSICAL EXAMINATION:  General Appearance:  [name] looks well.  He is in good spirits.  Vital Signs:  Weight is stable at 101 pounds and 6.4 ounces.  Height is 6 feet and 3 inches.  Body surface area 2.2 sq m. Blood pressure 139/80.  Pulse 110 and regular.  Respirations are regular and unlabored.  He is afebrile.  On his  previous visit, O2 saturation on room air was 100%.  HEENT:  There is no scleral icterus.  Mouth and pharynx are benign.  There is no peripheral adenopathy palpable.  Heart: Normal.  Lungs:  Normal.  Chest:  There is a right-sided Port-A-Cath that was placed today.  Abdomen:  With the patient sitting is benign. Musculoskeletal:  No skeletal tenderness to palpation.  Extremities: Trace right ankle edema, otherwise benign.  Neurologic Exam:  Grossly normal.  LABORATORY DATA:  Today, white count 6.4, ANC 4.9, hemoglobin 9.4, hematocrit 27.4, platelets 200,000.  Chemistries, LDH, uric acid, and urine protein-to-creatinine ratio are pending.  The patient is also going to have a 24-hour urine collection.  It will be recalled that when he was hospitalized one urine determination gave [place] 7,800 mg of protein per 24 hours.  In another assay, the free kappa light chain excretion was estimated to be 32 g per 24 hours.  The patient has hypogammaglobulinemia.  Chemistries from 07/31/2011 are notable for a BUN of 24 and creatinine of 2.09.  This has dramatically improved from previous values.  Calcium was 8.8 and uric acid was 5.3, also dramatically improved.  On 07/11/2011, the BUN was 50 and creatinine was 4.67.  The calcium at one point had been 14.5 on 06/26/2011.  On 06/26/2011, the BUN had been 60 and creatinine 5.1.  On 07/10/2011, uric acid had been 15.4.  IMPRESSION AND PLAN:  [name], at this point, seems to be doing quite well in terms of his blood counts and improving renal function. His calcium and uric acid are now normal.  He has had a dramatic improvement in his platelet count, which was below 20,000 at one point back 4-6 weeks ago.  Our plan is to continue with the same treatment with one modification.  I am going to reduce the Decadron dose from 40 mg down to 20 mg weekly.  The Velcade and Cytoxan doses will remain unchanged.  If the patient's renal function continues to  improve, we may want to consider increasing the Cytoxan.  The dose was reduced initially because of renal dysfunction and the fact that metabolites are excreted via the kidney.  We are going to continue with weekly treatments scheduled for 08/07/2011, 08/14/2011, 08/21/2011, 08/28/2011, and 09/04/2011.  On 09/04/2011, I will plan to see [name].  We will check CBC, chemistries, LDH, uric acid, and a urine protein-to- creatinine ratio.  [name] has an appointment to see Dr. Briant Cedar around 08/25/2011.  I am delighted with the patient's response to treatment.  At some point in the months ahead, we may want to repeat the bone marrow and consider referring the patient to one of our regional medical centers for consideration of high-dose chemotherapy and stem cell rescue.  We will look into whether the patient had cytogenetics and FISH studies on his bone marrow.  We also need to establish his immunization record. He may need Pneumovax and  flu shot.    ______________________________ Samul Dada, M.D. DSM/MEDQ  D:  08/05/2011  T:  08/05/2011  Job:  [id number]

## 2011-08-05 NOTE — H&P (Signed)
[name] is an 66 y.o. male.    Samul Dada, MD  07/19/2011  8:26 PM  Signed  CC:  [name], MD [name], M.D. [name], MD  HISTORY:  I am seeing [name] today at the [facility] for the 1st time since his recent admission to the hospital from 07/03/2011 through 07/13/2011, during which he was diagnosed with kappa light chain multiple myeloma.  That admission was precipitated by a rather massive epistaxis and melena.  The patient was found to have severe anemia, thrombocytopenia, hypercalcemia and renal insufficiency. The patient had been in excellent health up until about 2 months ago. He started having back pain and was found to have a couple of mild compression fractures.  Toward the end of November in preparation for kyphoplasty, he was found to have abnormal labs.  The patient was admitted briefly to the hospital for primarily hypercalcemia and renal insufficiency, but requested an early discharge with workup to be continued as an outpatient.  In any event, he was readmitted to the hospital on 07/03/2011, and I saw him on 07/04/2011.  The patient was also seen by renal consultants.  It was our opinion that the patient most likely had light chain disease, and that turned out to be the case. A bone marrow exam carried out on 07/08/2011 showed an extensively replaced bone marrow with atypical immature mononuclear cells with plasmacytoid features.  Immunohistochemical stains showed that the cells were strongly positive for CD38 and cytoplasmic kappa light chains. Flow cytometry analysis confirmed the immunohistochemical findings with positivity for CD38 and cytoplasmic kappa light chains.  The plasma cells had plasma blastic features.  Circulating immature plasma cells were seen on the peripheral smear.  The bone marrow showed 90% to 100% cellularity.  Storage iron was present.  Additional features showed heavy kappa light  chain excretion.  By one assay, there was 7800 mg of protein.  By another assay, free kappa light chains were measured at 32 g per 24 hours.  There was a very small M spike in the serum 0.19%.  IgG level was 519, which is low, IgA 38, which is low, and IgM 12, which is low.  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 Additional findings were an LDH that was 641 on December 8th, a normal vitamin B12 level of 332.  Chest x-ray from 07/07/2011 showed stable chronic lung disease.  Metastatic bone survey from 07/04/2011 showed no lytic lesions.  The patient had mild superior endplate compression deformity of T12 and a mild superior endplate compression deformity of L3.  The patient was having moderately severe back pain for the few weeks leading up to admission.  A nuclear bone scan carried out on 07/06/2011 showed increased activity at the posterolateral aspect of the right 8th rib correlating with a healed or healing fracture that was seen on the chest x-ray of 07/04/2011.  There were no areas of abnormal activity in the spine, specifically, no abnormal activity at T12 or L3.  A renal ultrasound on 06/29/2011 showed no evidence of obstruction.  There was increased echogenicity of the renal parenchyma consistent with renal medical disease and bilateral renal stones.  There was prominent sludge in the gallbladder.  [name] received his 1st course of chemotherapy in the hospital on Friday, December 14th, consisting of Velcade 1.3 mg/m squared equal to the 2.75 mg subcutaneously, Cytoxan 100 mg/m squared equal to 220 mg IV and Decadron 40 mg given IV for 3  days.  The patient has been having blood work since his discharge from the hospital on Monday the 17th. His CBC on 12/19 showed a white count of 10.4, hemoglobin 9.9, hematocrit 29.0, platelets 26,000.  I might add that the patient received multiple transfusions of platelets and red cells during his hospitalization.  Chemistries  on 12/19 were notable for a BUN of 58, creatinine 3.20.  These have improved.  Calcium also had improved and was 8.8 after being approximately 15 or 16 on admission. I should also mention that uric acid was 11.0 on the 19th whereas it had been 15.4 on 12/14.  The patient is here with his significant other with whom he is now residing, [employer].  The patient is here today for reassessment and chemotherapy.  We are hoping to treat him on a weekly basis.  Over the past several days since his discharge from the hospital on Monday the 17th, [name] seems to be doing fairly well.  His back pain is improved.  He is not taking any narcotics for this.  Although he is here today in a wheelchair,  I think he is getting around without any aids.  He walks slowly.  He has not been falling.  He is eating well without any GI problems.  He does have some mild constipation.  We talked about that.  The patient will use laxatives.  He denies any respiratory problems or swelling of his legs.  No history of confusion. He feels generally well.  There have been no bleeding or bruising problems.  No fever.  He has had some shaking, but these have not been febrile episodes.  The patient may need a Port-A-Cath because there have been some problems with venous access.  PROBLEM LIST: 1. Kappa light chain multiple myeloma, ISS III with diagnosis made in     December 2012.  The patient presents with poor prognosis disease,     specifically, anemia, thrombocytopenia, extensive bone marrow     replacement, immature plasma cells/plasma blasts and circulating     tumor cells, severe renal insufficiency, hypercalcemia and     apparently rather limited bone disease although he did have mild     compression fractures at T12 and L3.  I believe the patient's bone     density scan was normal.  Treatment with modified doses of Velcade,     Cytoxan and Decadron were started on 12/14.  It is our intention to     treat  weekly and transfuse as needed. 2. History of prostate cancer dating back to September 2003 for which     he underwent robotic-assisted laparoscopic radical prostatectomy by     Dr. [name].  Tumor was found to be pathologic T3a involving     both lobes with capsular extension and positive margin focally.     The patient did not receive postoperative radiation. 3. Hypertension. 4. Dyslipidemia. 5. Peripheral sensory neuropathy. 6. Poor venous access.  MEDICINES:  Reviewed and recorded. 1. Acyclovir 800 mg twice a day. 2. Allopurinol 100 mg daily 3. Multivitamin. 4. Zofran as needed. 5. Protonix 40 mg daily. 6. Renvela 800 mg 3 times a day with meals. 7. Zocor 20 mg at bedtime. 8. Ambien 5 mg at bedtime.  ALLERGIES:  The patient does not appear to have true allergies, however, he has itching from Meloxicam, Percocet and Vicodin and nausea or Celebrex.  PHYSICAL EXAM:  General:  The patient is in good spirits in a wheelchair.  He is in no acute distress at the present time.  His weight is 201 pounds, height 6 feet, 4 inches, body surface area 2.21 m squared.  Vital Signs:  Blood pressure 143/69.  Temperature is 97.1, pulse 95 and regular, respirations regular and unlabored.  HEENT:  There is no scalp alopecia.  No scleral icterus.  Mouth and pharynx are benign.  He has an upper plate and a partial lower plate.  Lymphatics: No adenopathy.  Lungs:  Clear to percussion auscultation.  Cardiac: Regular rhythm without murmur or rub.  Back:  No skeletal tenderness. Abdomen:  With the patient sitting, it is benign with no obvious organomegaly or masses palpable.  There is some resistance in the right upper quadrant.  Extremities:  No peripheral edema, clubbing, palmar erythema, petechiae, or purpura.  Skin:  Dry.  Neurologic:  Exam is grossly normal.  LABORATORY DATA:  Today white count 9.5, ANC 8.8, hemoglobin 26.3, platelets 22,000.  ANC is 3.7.  Differential is quite abnormal  with  35% segs, 28% lymphocytes 17% monocytes, 2% metamyelocytes, 2% myelocytes, 13% abnormal mononuclear cells, some of which looked plasma blastoid and 2% nucleated red cells.  Chemistries today:  Sodium 136, potassium 3.7, BUN 38, creatinine 2.94, calcium 9.6, glucose 126 and uric acid 9.6.  In general, the BUN, creatinine and uric acid have all improved.  The calcium, after being initially elevated at about 15-16, is now in the normal range.  IMPRESSION AND PLAN:  Clearly [name] has poor prognosis multiple myeloma, [employer] III.  As stated above, he has extensive replacement of the bone marrow with immature plasma cells, some of which are circulating, transfusion dependent anemia and thrombocytopenia, and severe renal insufficiency.  Fortunately, his renal function seems to be improving.  He also has considerable protein excretion in the urine.  Today the patient will receive a 2nd dose of chemotherapy as follows Velcade 2.9 mg subcutaneously, Cytoxan 220 mg IV and Decadron 40 mg IV.  In addition, tomorrow, given the weekend and Christmas on Tuesday, we are going to go ahead and give the patient 1 unit of packed red cells and 1 bag of pheresed platelets.  We are hoping that at some point, with improvement of his myeloma, that the patient may not require transfusions, but for now, we are supporting him.  It is our plan to continue with the current treatment program. If the patient shows some response, we will probably try to refer him to either [facility] or [facility] for evaluation and consideration of possible high-dose therapy and stem cell rescue.  The patient is 66 years old.  We gave [name] a prescription for Ultram 50 mg to take 3-4 times a day as needed.  He is having some pain, but his pain seems to be markedly improved.  The patient was cautioned about bleeding, bruising and infection.  The patient and his significant other, Romeo Rabon, understand that  should any questions or concerns arise, especially if any symptoms that suggest infection or bleeding problems arise, that the patient needs to notify us immediately or proceed directly to the emergency room.  We will continue to monitor him very carefully Mondays, Wednesdays and Fridays with a CBC, chemistries and a uric acid.  We will transfuse as Necessary.  We will check him for IgD and IgE.  He has been set up for chemotherapy every Friday,specifically December 28th, January 4th, and on January 11th.  I wouldlike to see the patient again on January  11th.  We     will be checking the urine protein to creatinine ratio and probably another 24-hour urine protein in the weeks ahead.    ______________________________ Samul Dada, M.D. DSM/MEDQ  D:  07/17/2011  T:  07/17/2011  Job:  [id number]    08/05/2011 Chief Complaint: multiple myeloma with poor IV access.  Presents today for port placement in IR. HPI: See H&P from oncology note.  Past Medical History  Diagnosis Date  . HTN (hypertension)   . Allergic rhinitis   . Prostate cancer 2008    s/p prostatectomy  . Hyperlipemia   . Hypercalcemia of malignancy   . Multiple myeloma 07/09/2011  . Myeloma kidney   . Peripheral neuropathy     toes, sees Dr Terrace Arabia    Past Surgical History  Procedure Date  . Kidney stone surgery   . Kidney stone surgery 06/2009    Stone removed from bladder  . Dexa 05/2011    spine 0.7, L femur 0.0, R femur -0.6, normal  . Prostatectomy 04/06/07    Family History  Problem Relation Age of Onset  . Pneumonia Mother   . Stroke Father   . Cancer Brother     lung  . Cancer Sister     bladder   Social History:  reports that he quit smoking about 15 years ago. He does not have any smokeless tobacco history on file. He reports that he drinks alcohol. He reports that he does not use illicit drugs.  Allergies:  Allergies  Allergen Reactions  . Celebrex (Celecoxib) Nausea Only  . Meloxicam Itching    . Percocet (Oxycodone-Acetaminophen) Itching  . Vicodin (Hydrocodone-Acetaminophen) Itching    Medications Prior to Admission  Medication Sig Dispense Refill  . acyclovir (ZOVIRAX) 800 MG tablet Take 800 mg by mouth 2 (two) times daily.        Marland Kitchen allopurinol (ZYLOPRIM) 100 MG tablet Take 1 tablet (100 mg total) by mouth daily. Take  1 and 1/2 tab = 150 mg daily or as directed  60 tablet  3  . Multiple Vitamin (MULTIVITAMIN) tablet Take 1 tablet by mouth daily.  30 tablet  0  . pantoprazole (PROTONIX) 40 MG tablet Take 1 tablet (40 mg total) by mouth daily at 12 noon.  30 tablet  0  . sevelamer (RENVELA) 800 MG tablet Take 1 tablet (800 mg total) by mouth 3 (three) times daily with meals.  90 tablet  0  . simvastatin (ZOCOR) 40 MG tablet Take 0.5 tablets (20 mg total) by mouth at bedtime.  30 tablet  0  . traMADol (ULTRAM) 50 MG tablet Take 1 tablet (50 mg total) by mouth every 4 (four) hours as needed for pain.  30 tablet  3  . zolpidem (AMBIEN) 5 MG tablet Take 1 tablet (5 mg total) by mouth at bedtime as needed for sleep (insomnia).  30 tablet  0   No current facility-administered medications on file as of 08/05/2011.    Review of Systems  Constitutional: Positive for chills and malaise/fatigue. Negative for fever.  HENT: Negative.   Respiratory: Negative.  Negative for cough, hemoptysis and shortness of breath.   Cardiovascular: Negative.  Negative for chest pain and leg swelling.  Gastrointestinal: Positive for heartburn and constipation. Negative for nausea, abdominal pain and melena.  Genitourinary: Negative.   Musculoskeletal: Positive for back pain. Negative for falls.  Skin: Negative.   Neurological: Positive for sensory change and weakness.  Psychiatric/Behavioral: The patient has insomnia.  Physical Exam  Constitutional: He is oriented to person, place, and time. He appears well-developed and well-nourished. No distress.  HENT:  Head: Normocephalic and atraumatic.   Neck: Normal range of motion.  Cardiovascular: Normal rate and regular rhythm.  Exam reveals no gallop and no friction rub.   Murmur heard. Respiratory: Effort normal and breath sounds normal. No respiratory distress. He has no wheezes. He exhibits no tenderness.  GI: Soft. Bowel sounds are normal.  Musculoskeletal: Normal range of motion. He exhibits no edema.  Neurological: He is alert and oriented to person, place, and time.  Skin: Skin is warm and dry.  Psychiatric: Judgment and thought content normal.     Assessment/Plan Patient with multiple myeloma with poor IV access in need of Port placement.  Procedure details discussed with patient in detail.  Potential complications including but not limited to infection, bleeding, vessel damage, malpositioned or malfunctioning port and possible complications with moderate sedation.  Patient verbalized his understanding of this information and written consent obtained.  Plan to proceed with port placement if labs drawn today are WNL.   Kalissa Grays D 08/05/2011, 9:51 AM

## 2011-08-05 NOTE — H&P (Signed)
agree

## 2011-08-06 ENCOUNTER — Telehealth: Payer: Self-pay | Admitting: Medical Oncology

## 2011-08-06 ENCOUNTER — Encounter (HOSPITAL_COMMUNITY): Payer: Self-pay | Admitting: Oncology

## 2011-08-06 ENCOUNTER — Other Ambulatory Visit: Payer: Self-pay | Admitting: Medical Oncology

## 2011-08-06 DIAGNOSIS — C9000 Multiple myeloma not having achieved remission: Secondary | ICD-10-CM

## 2011-08-06 MED ORDER — ACYCLOVIR 800 MG PO TABS: 800.0000 mg | ORAL_TABLET | Freq: Two times a day (BID) | ORAL | Status: DC

## 2011-08-06 MED ORDER — PANTOPRAZOLE SODIUM 40 MG PO TBEC: 40.0000 mg | DELAYED_RELEASE_TABLET | Freq: Every day | ORAL | Status: DC

## 2011-08-06 MED ORDER — ALLOPURINOL 100 MG PO TABS: 100.0000 mg | ORAL_TABLET | Freq: Every day | ORAL | Status: DC

## 2011-08-06 MED FILL — Ondansetron HCl Inj 40 MG/20ML (2 MG/ML): INTRAVENOUS | Qty: 8 | Status: AC

## 2011-08-06 NOTE — Telephone Encounter (Signed)
I spoke with pt regarding flu vaccine and pneumonia vaccine. He states that he got the flu vaccine in Oct at [facility] and the pneumonia vaccine at the [place] in April. I also let him know that I called in the protonix, allopurinol, and acyclovir to [facility]. Since Dr. Arline Asp did not prescribe the renvela he will need to call his kidney specialist Dr. Briant Cedar and discuss. Per Dr. Arline Asp since his renal function has improved they may discontinue. He states he call Dr. Briant Cedar regarding his renvela.

## 2011-08-06 NOTE — Telephone Encounter (Signed)
I called pt and left a message to ask if he had the flu shot this year and if he has ever had the pneumonia vaccine. Per hospital records he did not receive but not sure if he had at another facility. I asked him to call me back with the above information.

## 2011-08-07 ENCOUNTER — Encounter (HOSPITAL_COMMUNITY): Payer: Self-pay | Admitting: Oncology

## 2011-08-07 ENCOUNTER — Other Ambulatory Visit (HOSPITAL_BASED_OUTPATIENT_CLINIC_OR_DEPARTMENT_OTHER): Payer: Medicare Other | Admitting: Lab

## 2011-08-07 ENCOUNTER — Ambulatory Visit (HOSPITAL_BASED_OUTPATIENT_CLINIC_OR_DEPARTMENT_OTHER): Payer: Medicare Other

## 2011-08-07 DIAGNOSIS — Z5111 Encounter for antineoplastic chemotherapy: Secondary | ICD-10-CM

## 2011-08-07 DIAGNOSIS — C9000 Multiple myeloma not having achieved remission: Secondary | ICD-10-CM

## 2011-08-07 LAB — CBC WITH DIFFERENTIAL/PLATELET
BASO%: 1.7 % (ref 0.0–2.0)
EOS%: 4.6 % (ref 0.0–7.0)
HCT: 26.9 % — ABNORMAL LOW (ref 38.4–49.9)
LYMPH%: 12.7 % — ABNORMAL LOW (ref 14.0–49.0)
MCH: 29.9 pg (ref 27.2–33.4)
MCHC: 32.3 g/dL (ref 32.0–36.0)
MCV: 92.4 fL (ref 79.3–98.0)
MONO#: 0.8 10*3/uL (ref 0.1–0.9)
MONO%: 12.6 % (ref 0.0–14.0)
NEUT%: 68.4 % (ref 39.0–75.0)
Platelets: 169 10*3/uL (ref 140–400)

## 2011-08-07 LAB — BASIC METABOLIC PANEL
BUN: 25 mg/dL — ABNORMAL HIGH (ref 6–23)
Calcium: 8.6 mg/dL (ref 8.4–10.5)
Chloride: 104 mEq/L (ref 96–112)
Creatinine, Ser: 1.72 mg/dL — ABNORMAL HIGH (ref 0.50–1.35)

## 2011-08-07 LAB — PROTEIN, URINE, 24 HOUR
Collection Interval-UPROT: 24 hours
Protein, ur: <3 mg/dL

## 2011-08-07 MED ORDER — DEXAMETHASONE SODIUM PHOSPHATE 4 MG/ML IJ SOLN: 20.0000 mg | Freq: Once | INTRAMUSCULAR | Status: DC

## 2011-08-07 MED ORDER — SODIUM CHLORIDE 0.9 % IV SOLN
100.0000 mg/m2 | Freq: Once | INTRAVENOUS | Status: AC
  Administered 2011-08-07: 220 mg via INTRAVENOUS
  Filled 2011-08-07: qty 11

## 2011-08-07 MED ORDER — ONDANSETRON 8 MG/50ML IVPB (CHCC): 8.0000 mg | Freq: Once | INTRAVENOUS | Status: DC

## 2011-08-07 MED ORDER — SODIUM CHLORIDE 0.9 % IV SOLN: Freq: Once | INTRAVENOUS | Status: DC

## 2011-08-07 MED ORDER — SODIUM CHLORIDE 0.9 % IJ SOLN
10.0000 mL | INTRAMUSCULAR | Status: DC | PRN
  Administered 2011-08-07: 10 mL
  Filled 2011-08-07: qty 10

## 2011-08-07 MED ORDER — HEPARIN SOD (PORK) LOCK FLUSH 100 UNIT/ML IV SOLN
500.0000 [IU] | Freq: Once | INTRAVENOUS | Status: AC | PRN
  Administered 2011-08-07: 500 [IU]
  Filled 2011-08-07: qty 5

## 2011-08-07 MED ORDER — BORTEZOMIB CHEMO IV INJECTION 3.5 MG
1.3000 mg/m2 | Freq: Once | INTRAMUSCULAR | Status: AC
  Administered 2011-08-07: 2.9 mg via INTRAVENOUS
  Filled 2011-08-07: qty 2.9

## 2011-08-07 NOTE — Patient Instructions (Signed)
Call md for problems.  D Lachrista Heslin rn 

## 2011-08-07 NOTE — Progress Notes (Signed)
Pneumonia vaccine received at [facility] in April 2012.   Flu shot received in October 2012.

## 2011-08-13 ENCOUNTER — Other Ambulatory Visit: Payer: Self-pay | Admitting: Oncology

## 2011-08-14 ENCOUNTER — Other Ambulatory Visit: Payer: Self-pay

## 2011-08-14 ENCOUNTER — Encounter: Payer: Self-pay | Admitting: Medical Oncology

## 2011-08-14 ENCOUNTER — Encounter: Payer: Self-pay | Admitting: Oncology

## 2011-08-14 ENCOUNTER — Ambulatory Visit (HOSPITAL_BASED_OUTPATIENT_CLINIC_OR_DEPARTMENT_OTHER): Payer: Medicare Other

## 2011-08-14 ENCOUNTER — Other Ambulatory Visit: Payer: Self-pay | Admitting: Oncology

## 2011-08-14 ENCOUNTER — Other Ambulatory Visit (HOSPITAL_BASED_OUTPATIENT_CLINIC_OR_DEPARTMENT_OTHER): Payer: Medicare Other | Admitting: Lab

## 2011-08-14 DIAGNOSIS — C9000 Multiple myeloma not having achieved remission: Secondary | ICD-10-CM

## 2011-08-14 DIAGNOSIS — D649 Anemia, unspecified: Secondary | ICD-10-CM

## 2011-08-14 DIAGNOSIS — D631 Anemia in chronic kidney disease: Secondary | ICD-10-CM

## 2011-08-14 DIAGNOSIS — N189 Chronic kidney disease, unspecified: Secondary | ICD-10-CM | POA: Insufficient documentation

## 2011-08-14 DIAGNOSIS — Z5111 Encounter for antineoplastic chemotherapy: Secondary | ICD-10-CM

## 2011-08-14 DIAGNOSIS — N289 Disorder of kidney and ureter, unspecified: Secondary | ICD-10-CM

## 2011-08-14 LAB — CBC WITH DIFFERENTIAL/PLATELET
BASO%: 0.1 % (ref 0.0–2.0)
EOS%: 2.6 % (ref 0.0–7.0)
HCT: 24.2 % — ABNORMAL LOW (ref 38.4–49.9)
LYMPH%: 14 % (ref 14.0–49.0)
MCH: 32 pg (ref 27.2–33.4)
MCHC: 34.5 g/dL (ref 32.0–36.0)
MONO%: 10.7 % (ref 0.0–14.0)
NEUT%: 72.6 % (ref 39.0–75.0)
Platelets: 175 10*3/uL (ref 140–400)
lymph#: 0.8 10*3/uL — ABNORMAL LOW (ref 0.9–3.3)

## 2011-08-14 LAB — BASIC METABOLIC PANEL
Calcium: 8.7 mg/dL (ref 8.4–10.5)
Creatinine, Ser: 1.82 mg/dL — ABNORMAL HIGH (ref 0.50–1.35)

## 2011-08-14 MED ORDER — DEXAMETHASONE SODIUM PHOSPHATE 4 MG/ML IJ SOLN
20.0000 mg | Freq: Once | INTRAMUSCULAR | Status: AC
  Administered 2011-08-14: 20 mg via INTRAVENOUS

## 2011-08-14 MED ORDER — DARBEPOETIN ALFA-POLYSORBATE 300 MCG/0.6ML IJ SOLN
300.0000 ug | Freq: Once | INTRAMUSCULAR | Status: AC
  Administered 2011-08-14: 300 ug via SUBCUTANEOUS
  Filled 2011-08-14: qty 0.6

## 2011-08-14 MED ORDER — ONDANSETRON 8 MG/50ML IVPB (CHCC)
8.0000 mg | Freq: Once | INTRAVENOUS | Status: AC
  Administered 2011-08-14: 8 mg via INTRAVENOUS

## 2011-08-14 MED ORDER — SODIUM CHLORIDE 0.9 % IV SOLN
Freq: Once | INTRAVENOUS | Status: AC
  Administered 2011-08-14: 13:00:00 via INTRAVENOUS

## 2011-08-14 MED ORDER — SODIUM CHLORIDE 0.9 % IJ SOLN
10.0000 mL | INTRAMUSCULAR | Status: DC | PRN
  Administered 2011-08-14: 10 mL
  Filled 2011-08-14: qty 10

## 2011-08-14 MED ORDER — BORTEZOMIB CHEMO IV INJECTION 3.5 MG
1.3000 mg/m2 | Freq: Once | INTRAMUSCULAR | Status: AC
  Administered 2011-08-14: 2.9 mg via INTRAVENOUS
  Filled 2011-08-14: qty 2.9

## 2011-08-14 MED ORDER — HEPARIN SOD (PORK) LOCK FLUSH 100 UNIT/ML IV SOLN
500.0000 [IU] | Freq: Once | INTRAVENOUS | Status: AC | PRN
  Administered 2011-08-14: 500 [IU]
  Filled 2011-08-14: qty 5

## 2011-08-14 MED ORDER — SODIUM CHLORIDE 0.9 % IV SOLN
100.0000 mg/m2 | Freq: Once | INTRAVENOUS | Status: AC
  Administered 2011-08-14: 220 mg via INTRAVENOUS
  Filled 2011-08-14: qty 11

## 2011-08-14 NOTE — Progress Notes (Signed)
Patient started on Aranesp q 2 weeks for HGB </= 11.0.  Signed informed consent obtained.  To check CBC on 1/22.

## 2011-08-21 ENCOUNTER — Ambulatory Visit: Payer: Medicare Other

## 2011-08-21 ENCOUNTER — Telehealth: Payer: Self-pay | Admitting: Oncology

## 2011-08-21 ENCOUNTER — Other Ambulatory Visit: Payer: Medicare Other

## 2011-08-21 ENCOUNTER — Telehealth: Payer: Self-pay

## 2011-08-21 NOTE — Telephone Encounter (Signed)
Received call from pt wishing to cancel his appt for lab/infusion today d/t bad weather. Pt questions if Dr Arline Asp would agree.  After speaking with Dr Arline Asp, notified pt that MD is comfortable with pt missing today's appt.  Pt is scheduled for lab and infusion next week and understands that he is to keep these appts as scheduled. dph

## 2011-08-21 NOTE — Telephone Encounter (Signed)
pt called to cx 1/25 appt due to weather but was concerned about missing tx,l/vm for donnica aom

## 2011-08-24 ENCOUNTER — Other Ambulatory Visit: Payer: Self-pay | Admitting: Oncology

## 2011-08-26 ENCOUNTER — Other Ambulatory Visit: Payer: Self-pay | Admitting: Certified Registered Nurse Anesthetist

## 2011-08-28 ENCOUNTER — Telehealth: Payer: Self-pay | Admitting: Family Medicine

## 2011-08-28 ENCOUNTER — Other Ambulatory Visit (HOSPITAL_BASED_OUTPATIENT_CLINIC_OR_DEPARTMENT_OTHER): Payer: Medicare Other | Admitting: Lab

## 2011-08-28 ENCOUNTER — Other Ambulatory Visit: Payer: Self-pay | Admitting: Medical Oncology

## 2011-08-28 ENCOUNTER — Ambulatory Visit (HOSPITAL_BASED_OUTPATIENT_CLINIC_OR_DEPARTMENT_OTHER): Payer: Medicare Other

## 2011-08-28 DIAGNOSIS — C9000 Multiple myeloma not having achieved remission: Secondary | ICD-10-CM

## 2011-08-28 DIAGNOSIS — Z5112 Encounter for antineoplastic immunotherapy: Secondary | ICD-10-CM

## 2011-08-28 LAB — BASIC METABOLIC PANEL
Calcium: 8.8 mg/dL (ref 8.4–10.5)
Creatinine, Ser: 1.2 mg/dL (ref 0.50–1.35)
Sodium: 140 mEq/L (ref 135–145)

## 2011-08-28 LAB — CBC WITH DIFFERENTIAL/PLATELET
BASO%: 1.1 % (ref 0.0–2.0)
EOS%: 3.1 % (ref 0.0–7.0)
HCT: 33.2 % — ABNORMAL LOW (ref 38.4–49.9)
LYMPH%: 14.4 % (ref 14.0–49.0)
MCH: 32 pg (ref 27.2–33.4)
MCHC: 32.8 g/dL (ref 32.0–36.0)
MCV: 97.4 fL (ref 79.3–98.0)
MONO#: 0.7 10*3/uL (ref 0.1–0.9)
MONO%: 10.4 % (ref 0.0–14.0)
NEUT%: 71 % (ref 39.0–75.0)
Platelets: 268 10*3/uL (ref 140–400)
RBC: 3.41 10*6/uL — ABNORMAL LOW (ref 4.20–5.82)
WBC: 6.6 10*3/uL (ref 4.0–10.3)

## 2011-08-28 MED ORDER — HEPARIN SOD (PORK) LOCK FLUSH 100 UNIT/ML IV SOLN
500.0000 [IU] | Freq: Once | INTRAVENOUS | Status: AC | PRN
  Administered 2011-08-28: 500 [IU]
  Filled 2011-08-28: qty 5

## 2011-08-28 MED ORDER — DEXAMETHASONE SODIUM PHOSPHATE 10 MG/ML IJ SOLN
20.0000 mg | Freq: Once | INTRAMUSCULAR | Status: AC
  Administered 2011-08-28: 20 mg via INTRAVENOUS

## 2011-08-28 MED ORDER — SODIUM CHLORIDE 0.9 % IV SOLN
100.0000 mg/m2 | Freq: Once | INTRAVENOUS | Status: AC
  Administered 2011-08-28: 220 mg via INTRAVENOUS
  Filled 2011-08-28: qty 11

## 2011-08-28 MED ORDER — ONDANSETRON 8 MG/50ML IVPB (CHCC)
8.0000 mg | Freq: Once | INTRAVENOUS | Status: AC
  Administered 2011-08-28: 8 mg via INTRAVENOUS

## 2011-08-28 MED ORDER — SIMVASTATIN 40 MG PO TABS: 20.0000 mg | ORAL_TABLET | Freq: Every day | ORAL | Status: DC

## 2011-08-28 MED ORDER — BORTEZOMIB CHEMO IV INJECTION 3.5 MG
1.3000 mg/m2 | Freq: Once | INTRAMUSCULAR | Status: AC
  Administered 2011-08-28: 2.9 mg via INTRAVENOUS
  Filled 2011-08-28: qty 2.9

## 2011-08-28 MED ORDER — SODIUM CHLORIDE 0.9 % IV SOLN
Freq: Once | INTRAVENOUS | Status: AC
  Administered 2011-08-28: 12:00:00 via INTRAVENOUS

## 2011-08-28 MED ORDER — SODIUM CHLORIDE 0.9 % IJ SOLN
10.0000 mL | INTRAMUSCULAR | Status: DC | PRN
  Administered 2011-08-28: 10 mL
  Filled 2011-08-28: qty 10

## 2011-08-28 NOTE — Telephone Encounter (Signed)
rx sent to pharmacy and patient advised. 

## 2011-08-28 NOTE — Telephone Encounter (Signed)
Pt called and asked for a 90 day supply of tramadol thru his mail order pharmacy. I called him to let him know we can not do 90 days worth of any  pain meds thru regular pharmacy or the mail order. He does have refills at the [facility] so he will get it refilled there.

## 2011-08-28 NOTE — Telephone Encounter (Signed)
Pt's girlfriend Romeo Rabon called the office in regards to Mr. Schueler prescription. She stated that Mr. Tracz is running out of Simbastain (20mg  a day) and only has medication left for 9 days. Since he is a member of [employer] his prescription is no longer mailed in, therefore it now has to be called in to [facility] at [phone]. Best number to reach them is [phone].

## 2011-09-02 ENCOUNTER — Ambulatory Visit (HOSPITAL_BASED_OUTPATIENT_CLINIC_OR_DEPARTMENT_OTHER): Payer: Medicare Other | Admitting: Oncology

## 2011-09-02 ENCOUNTER — Other Ambulatory Visit: Payer: Medicare Other | Admitting: Lab

## 2011-09-02 ENCOUNTER — Telehealth: Payer: Self-pay | Admitting: Oncology

## 2011-09-02 DIAGNOSIS — C9000 Multiple myeloma not having achieved remission: Secondary | ICD-10-CM

## 2011-09-02 LAB — CBC WITH DIFFERENTIAL/PLATELET
Basophils Absolute: 0 10*3/uL (ref 0.0–0.1)
Eosinophils Absolute: 0.1 10*3/uL (ref 0.0–0.5)
HCT: 33.1 % — ABNORMAL LOW (ref 38.4–49.9)
HGB: 11.3 g/dL — ABNORMAL LOW (ref 13.0–17.1)
LYMPH%: 16 % (ref 14.0–49.0)
MCV: 98.2 fL — ABNORMAL HIGH (ref 79.3–98.0)
MONO#: 0.8 10*3/uL (ref 0.1–0.9)
MONO%: 14.9 % — ABNORMAL HIGH (ref 0.0–14.0)
NEUT#: 3.4 10*3/uL (ref 1.5–6.5)
NEUT%: 65.8 % (ref 39.0–75.0)
Platelets: 205 10*3/uL (ref 140–400)
RBC: 3.37 10*6/uL — ABNORMAL LOW (ref 4.20–5.82)
WBC: 5.2 10*3/uL (ref 4.0–10.3)

## 2011-09-02 LAB — LACTATE DEHYDROGENASE: LDH: 122 U/L (ref 94–250)

## 2011-09-02 LAB — COMPREHENSIVE METABOLIC PANEL
Alkaline Phosphatase: 153 U/L — ABNORMAL HIGH (ref 39–117)
BUN: 22 mg/dL (ref 6–23)
CO2: 26 mEq/L (ref 19–32)
Creatinine, Ser: 1.39 mg/dL — ABNORMAL HIGH (ref 0.50–1.35)
Glucose, Bld: 83 mg/dL (ref 70–99)
Total Bilirubin: 0.6 mg/dL (ref 0.3–1.2)

## 2011-09-02 LAB — PROTEIN / CREATININE RATIO, URINE
Creatinine, Urine: 118.6 mg/dL
Protein Creatinine Ratio: 0.08 (ref <0.15)

## 2011-09-02 LAB — URIC ACID: Uric Acid, Serum: 6.8 mg/dL (ref 4.0–7.8)

## 2011-09-02 NOTE — Progress Notes (Signed)
This office note has been dictated.  #[id number]
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
## 2011-09-02 NOTE — Telephone Encounter (Signed)
appts made and printed for 2/15 2/22 aware i will get w/ them on 3/1 appt aom

## 2011-09-03 ENCOUNTER — Telehealth: Payer: Self-pay | Admitting: Oncology

## 2011-09-03 NOTE — Telephone Encounter (Signed)
called pt with 3/1 appts   aom

## 2011-09-04 ENCOUNTER — Other Ambulatory Visit: Payer: Medicare Other | Admitting: Lab

## 2011-09-04 ENCOUNTER — Ambulatory Visit (HOSPITAL_BASED_OUTPATIENT_CLINIC_OR_DEPARTMENT_OTHER): Payer: Medicare Other

## 2011-09-04 DIAGNOSIS — C9000 Multiple myeloma not having achieved remission: Secondary | ICD-10-CM

## 2011-09-04 DIAGNOSIS — Z5111 Encounter for antineoplastic chemotherapy: Secondary | ICD-10-CM

## 2011-09-04 MED ORDER — ONDANSETRON 8 MG/50ML IVPB (CHCC)
8.0000 mg | Freq: Once | INTRAVENOUS | Status: AC
  Administered 2011-09-04: 8 mg via INTRAVENOUS

## 2011-09-04 MED ORDER — DEXAMETHASONE SODIUM PHOSPHATE 10 MG/ML IJ SOLN
20.0000 mg | Freq: Once | INTRAMUSCULAR | Status: AC
  Administered 2011-09-04: 20 mg via INTRAVENOUS

## 2011-09-04 MED ORDER — SODIUM CHLORIDE 0.9 % IJ SOLN
10.0000 mL | INTRAMUSCULAR | Status: DC | PRN
  Administered 2011-09-04: 10 mL
  Filled 2011-09-04: qty 10

## 2011-09-04 MED ORDER — HEPARIN SOD (PORK) LOCK FLUSH 100 UNIT/ML IV SOLN
500.0000 [IU] | Freq: Once | INTRAVENOUS | Status: AC | PRN
  Administered 2011-09-04: 500 [IU]
  Filled 2011-09-04: qty 5

## 2011-09-04 MED ORDER — SODIUM CHLORIDE 0.9 % IV SOLN
140.0000 mg/m2 | Freq: Once | INTRAVENOUS | Status: AC
  Administered 2011-09-04: 300 mg via INTRAVENOUS
  Filled 2011-09-04: qty 15

## 2011-09-04 MED ORDER — BORTEZOMIB CHEMO IV INJECTION 3.5 MG
1.3000 mg/m2 | Freq: Once | INTRAMUSCULAR | Status: AC
  Administered 2011-09-04: 2.9 mg via INTRAVENOUS
  Filled 2011-09-04: qty 2.9

## 2011-09-04 MED ORDER — SODIUM CHLORIDE 0.9 % IV SOLN
Freq: Once | INTRAVENOUS | Status: AC
  Administered 2011-09-04: 11:00:00 via INTRAVENOUS

## 2011-09-11 ENCOUNTER — Ambulatory Visit (HOSPITAL_BASED_OUTPATIENT_CLINIC_OR_DEPARTMENT_OTHER): Payer: Medicare Other

## 2011-09-11 ENCOUNTER — Other Ambulatory Visit: Payer: Medicare Other | Admitting: Lab

## 2011-09-11 DIAGNOSIS — C9000 Multiple myeloma not having achieved remission: Secondary | ICD-10-CM

## 2011-09-11 DIAGNOSIS — Z5111 Encounter for antineoplastic chemotherapy: Secondary | ICD-10-CM

## 2011-09-11 DIAGNOSIS — Z5112 Encounter for antineoplastic immunotherapy: Secondary | ICD-10-CM

## 2011-09-11 LAB — CBC WITH DIFFERENTIAL/PLATELET
BASO%: 0.5 % (ref 0.0–2.0)
EOS%: 2.8 % (ref 0.0–7.0)
HCT: 33.9 % — ABNORMAL LOW (ref 38.4–49.9)
LYMPH%: 16.7 % (ref 14.0–49.0)
MCH: 32.5 pg (ref 27.2–33.4)
MCHC: 33.9 g/dL (ref 32.0–36.0)
MCV: 95.8 fL (ref 79.3–98.0)
NEUT%: 67.3 % (ref 39.0–75.0)
Platelets: 225 10*3/uL (ref 140–400)

## 2011-09-11 MED ORDER — SODIUM CHLORIDE 0.9 % IJ SOLN
10.0000 mL | INTRAMUSCULAR | Status: DC | PRN
  Administered 2011-09-11: 10 mL
  Filled 2011-09-11: qty 10

## 2011-09-11 MED ORDER — SODIUM CHLORIDE 0.9 % IV SOLN
Freq: Once | INTRAVENOUS | Status: AC
  Administered 2011-09-11: 09:00:00 via INTRAVENOUS

## 2011-09-11 MED ORDER — ONDANSETRON 8 MG/50ML IVPB (CHCC)
8.0000 mg | Freq: Once | INTRAVENOUS | Status: AC
  Administered 2011-09-11: 8 mg via INTRAVENOUS

## 2011-09-11 MED ORDER — BORTEZOMIB CHEMO IV INJECTION 3.5 MG
1.3000 mg/m2 | Freq: Once | INTRAMUSCULAR | Status: AC
  Administered 2011-09-11: 2.9 mg via INTRAVENOUS
  Filled 2011-09-11: qty 2.9

## 2011-09-11 MED ORDER — DEXAMETHASONE SODIUM PHOSPHATE 10 MG/ML IJ SOLN
20.0000 mg | Freq: Once | INTRAMUSCULAR | Status: AC
  Administered 2011-09-11: 20 mg via INTRAVENOUS

## 2011-09-11 MED ORDER — HEPARIN SOD (PORK) LOCK FLUSH 100 UNIT/ML IV SOLN
500.0000 [IU] | Freq: Once | INTRAVENOUS | Status: AC | PRN
  Administered 2011-09-11: 500 [IU]
  Filled 2011-09-11: qty 5

## 2011-09-11 MED ORDER — SODIUM CHLORIDE 0.9 % IV SOLN
140.0000 mg/m2 | Freq: Once | INTRAVENOUS | Status: AC
  Administered 2011-09-11: 300 mg via INTRAVENOUS
  Filled 2011-09-11: qty 15

## 2011-09-11 NOTE — Patient Instructions (Signed)
Call if any problems.  Pt aware of next appt. 

## 2011-09-15 ENCOUNTER — Other Ambulatory Visit: Payer: Self-pay | Admitting: Certified Registered Nurse Anesthetist

## 2011-09-18 ENCOUNTER — Other Ambulatory Visit: Payer: Medicare Other | Admitting: Lab

## 2011-09-18 ENCOUNTER — Ambulatory Visit (HOSPITAL_BASED_OUTPATIENT_CLINIC_OR_DEPARTMENT_OTHER): Payer: Medicare Other

## 2011-09-18 ENCOUNTER — Other Ambulatory Visit: Payer: Self-pay

## 2011-09-18 DIAGNOSIS — C61 Malignant neoplasm of prostate: Secondary | ICD-10-CM

## 2011-09-18 DIAGNOSIS — M549 Dorsalgia, unspecified: Secondary | ICD-10-CM

## 2011-09-18 DIAGNOSIS — Z5111 Encounter for antineoplastic chemotherapy: Secondary | ICD-10-CM

## 2011-09-18 DIAGNOSIS — C9000 Multiple myeloma not having achieved remission: Secondary | ICD-10-CM

## 2011-09-18 DIAGNOSIS — D631 Anemia in chronic kidney disease: Secondary | ICD-10-CM

## 2011-09-18 DIAGNOSIS — Z5112 Encounter for antineoplastic immunotherapy: Secondary | ICD-10-CM

## 2011-09-18 DIAGNOSIS — N189 Chronic kidney disease, unspecified: Secondary | ICD-10-CM

## 2011-09-18 DIAGNOSIS — N039 Chronic nephritic syndrome with unspecified morphologic changes: Secondary | ICD-10-CM

## 2011-09-18 DIAGNOSIS — G8929 Other chronic pain: Secondary | ICD-10-CM

## 2011-09-18 LAB — CBC WITH DIFFERENTIAL/PLATELET
Eosinophils Absolute: 0.2 10*3/uL (ref 0.0–0.5)
HGB: 12.3 g/dL — ABNORMAL LOW (ref 13.0–17.1)
MONO#: 0.8 10*3/uL (ref 0.1–0.9)
NEUT#: 5.1 10*3/uL (ref 1.5–6.5)
RBC: 3.75 10*6/uL — ABNORMAL LOW (ref 4.20–5.82)
RDW: 15.5 % — ABNORMAL HIGH (ref 11.0–14.6)
WBC: 7.2 10*3/uL (ref 4.0–10.3)
lymph#: 1.1 10*3/uL (ref 0.9–3.3)
nRBC: 0 % (ref 0–0)

## 2011-09-18 MED ORDER — SODIUM CHLORIDE 0.9 % IV SOLN
Freq: Once | INTRAVENOUS | Status: AC
  Administered 2011-09-18: 10:00:00 via INTRAVENOUS

## 2011-09-18 MED ORDER — TRAMADOL HCL 50 MG PO TABS: 50.0000 mg | ORAL_TABLET | ORAL | Status: DC | PRN

## 2011-09-18 MED ORDER — DEXAMETHASONE SODIUM PHOSPHATE 10 MG/ML IJ SOLN
20.0000 mg | Freq: Once | INTRAMUSCULAR | Status: AC
  Administered 2011-09-18: 20 mg via INTRAVENOUS

## 2011-09-18 MED ORDER — ONDANSETRON 8 MG/50ML IVPB (CHCC)
8.0000 mg | Freq: Once | INTRAVENOUS | Status: AC
  Administered 2011-09-18: 8 mg via INTRAVENOUS

## 2011-09-18 MED ORDER — BORTEZOMIB CHEMO IV INJECTION 3.5 MG
1.3000 mg/m2 | Freq: Once | INTRAMUSCULAR | Status: AC
  Administered 2011-09-18: 2.9 mg via INTRAVENOUS
  Filled 2011-09-18: qty 2.9

## 2011-09-18 MED ORDER — SODIUM CHLORIDE 0.9 % IV SOLN
140.0000 mg/m2 | Freq: Once | INTRAVENOUS | Status: AC
  Administered 2011-09-18: 300 mg via INTRAVENOUS
  Filled 2011-09-18: qty 15

## 2011-09-24 ENCOUNTER — Other Ambulatory Visit: Payer: Self-pay | Admitting: Oncology

## 2011-09-24 NOTE — Progress Notes (Signed)
This encounter was created in error - please disregard.

## 2011-09-25 ENCOUNTER — Telehealth: Payer: Self-pay | Admitting: *Deleted

## 2011-09-25 ENCOUNTER — Encounter: Payer: Self-pay | Admitting: Oncology

## 2011-09-25 ENCOUNTER — Ambulatory Visit: Payer: Medicare Other | Admitting: Oncology

## 2011-09-25 ENCOUNTER — Other Ambulatory Visit: Payer: Medicare Other | Admitting: Lab

## 2011-09-25 ENCOUNTER — Ambulatory Visit (HOSPITAL_BASED_OUTPATIENT_CLINIC_OR_DEPARTMENT_OTHER): Payer: Medicare Other

## 2011-09-25 DIAGNOSIS — Z5112 Encounter for antineoplastic immunotherapy: Secondary | ICD-10-CM

## 2011-09-25 DIAGNOSIS — Z5111 Encounter for antineoplastic chemotherapy: Secondary | ICD-10-CM

## 2011-09-25 DIAGNOSIS — C9000 Multiple myeloma not having achieved remission: Secondary | ICD-10-CM

## 2011-09-25 LAB — COMPREHENSIVE METABOLIC PANEL
ALT: 12 U/L (ref 0–53)
CO2: 26 mEq/L (ref 19–32)
Calcium: 9.2 mg/dL (ref 8.4–10.5)
Chloride: 103 mEq/L (ref 96–112)
Creatinine, Ser: 1.18 mg/dL (ref 0.50–1.35)
Glucose, Bld: 82 mg/dL (ref 70–99)
Total Bilirubin: 0.4 mg/dL (ref 0.3–1.2)

## 2011-09-25 LAB — CBC WITH DIFFERENTIAL/PLATELET
Basophils Absolute: 0 10*3/uL (ref 0.0–0.1)
Eosinophils Absolute: 0.3 10*3/uL (ref 0.0–0.5)
HCT: 34.2 % — ABNORMAL LOW (ref 38.4–49.9)
HGB: 11.7 g/dL — ABNORMAL LOW (ref 13.0–17.1)
LYMPH%: 12.4 % — ABNORMAL LOW (ref 14.0–49.0)
MCV: 95.3 fL (ref 79.3–98.0)
MONO#: 1 10*3/uL — ABNORMAL HIGH (ref 0.1–0.9)
MONO%: 14.8 % — ABNORMAL HIGH (ref 0.0–14.0)
NEUT#: 4.5 10*3/uL (ref 1.5–6.5)
NEUT%: 68.4 % (ref 39.0–75.0)
Platelets: 185 10*3/uL (ref 140–400)
RBC: 3.59 10*6/uL — ABNORMAL LOW (ref 4.20–5.82)
WBC: 6.6 10*3/uL (ref 4.0–10.3)

## 2011-09-25 LAB — PROTEIN / CREATININE RATIO, URINE
Creatinine, Urine: 117 mg/dL
Protein Creatinine Ratio: 0.03 (ref <0.15)

## 2011-09-25 LAB — URIC ACID: Uric Acid, Serum: 5.3 mg/dL (ref 4.0–7.8)

## 2011-09-25 LAB — LACTATE DEHYDROGENASE: LDH: 130 U/L (ref 94–250)

## 2011-09-25 MED ORDER — DEXAMETHASONE SODIUM PHOSPHATE 10 MG/ML IJ SOLN
20.0000 mg | Freq: Once | INTRAMUSCULAR | Status: AC
  Administered 2011-09-25: 20 mg via INTRAVENOUS

## 2011-09-25 MED ORDER — HEPARIN SOD (PORK) LOCK FLUSH 100 UNIT/ML IV SOLN
500.0000 [IU] | Freq: Once | INTRAVENOUS | Status: AC | PRN
  Administered 2011-09-25: 500 [IU]
  Filled 2011-09-25: qty 5

## 2011-09-25 MED ORDER — ONDANSETRON 8 MG/50ML IVPB (CHCC)
8.0000 mg | Freq: Once | INTRAVENOUS | Status: AC
  Administered 2011-09-25: 8 mg via INTRAVENOUS

## 2011-09-25 MED ORDER — SODIUM CHLORIDE 0.9 % IV SOLN
Freq: Once | INTRAVENOUS | Status: AC
  Administered 2011-09-25: 11:00:00 via INTRAVENOUS

## 2011-09-25 MED ORDER — SODIUM CHLORIDE 0.9 % IJ SOLN
10.0000 mL | INTRAMUSCULAR | Status: DC | PRN
  Administered 2011-09-25: 10 mL
  Filled 2011-09-25: qty 10

## 2011-09-25 MED ORDER — BORTEZOMIB CHEMO IV INJECTION 3.5 MG
1.3000 mg/m2 | Freq: Once | INTRAMUSCULAR | Status: AC
  Administered 2011-09-25: 2.9 mg via INTRAVENOUS
  Filled 2011-09-25: qty 2.9

## 2011-09-25 MED ORDER — SODIUM CHLORIDE 0.9 % IV SOLN
140.0000 mg/m2 | Freq: Once | INTRAVENOUS | Status: AC
  Administered 2011-09-25: 300 mg via INTRAVENOUS
  Filled 2011-09-25: qty 15

## 2011-09-25 NOTE — Progress Notes (Signed)
This office note has been dictated.  #[id number]

## 2011-09-25 NOTE — Progress Notes (Signed)
CC:   [name], MD [name], M.D. [name], MD  PROBLEM LIST:  1. Kappa light chain multiple myeloma, ISS III with diagnosis  established in December 2012 when the patient presented with marked  anemia, thrombocytopenia and extensive bone marrow replacement with  immature plasma cells/plasma blasts and circulating plasma cells in  the peripheral blood. Bone marrow was carried out on 07/08/2011.  In addition, the patient had severe renal insufficiency and  hypercalcemia. He was requiring transfusions of platelets and red  cells. He appeared to have limited bone disease characterized by  mild compression fractures of T12 and L3. A bone density scan done  a couple months preceding the diagnosis had been normal. Results  of cytogenetic analysis revealed the presence of 2 clonal cell  lines. The 1st cell line was chromosomally normal (20% of cells).  The 2nd cell line was cytogenetically abnormal and was hypodiploid  in nature with loss of chromosomes 4, 13, 14, 16, 17, 20 and 22.  There was a gain of chromosome 7 and a marker chromosome.  Chromosome 2 appeared to be in a rearrangement involving both its  short and long arms. FISH studies showed loss of chromosome 4, loss  of chromosome 14, loss of chromosome 13, and loss of chromosome 17,  i.e. 4 P-, 14 Q-, 13 Q- and 17 P-. These findings would be  consistent with abnormalities associated with multiple myeloma.  The patient has been receiving Velcade, Cytoxan and Decadron on a  weekly basis since these treatments were started on 07/10/2011.  2. Pancytopenia at diagnosis secondary to extensive marrow replacement  by multiple myeloma.  3. Renal insufficiency, markedly improved since diagnosis.  4. Hypercalcemia at presentation, now resolved following treatment.  5. Anemia which developed in mid January 2012 secondary to treatment  responsive to Aranesp.  6. History of prostate cancer dating back to September 2003  treated  with robotic assisted laparoscopic radical prostatectomy by Dr.  [name]. The tumor was pathologic T3a, involving both lobes  of the prostate with capsular extension and positive margin  focally. The patient did not receive postoperative radiation.  7. Hypertension.  8. Dyslipidemia.  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  10.Right-sided Port-A-Cath placement on 08/05/2011.    MEDICATIONS:  1. Acyclovir 800 mg twice a day.  2. Allopurinol 150 mg daily.  3. EMLA cream.  4. Multivitamins.  5. Protonix.  6. Zocor 20 mg at bedtime.  7. Ultram 50 mg every 4 hours as needed for pain.  8. Ambien 5 mg at bedtime as needed.  9. Tylenol 650 mg twice a day.   Pneumovax was administered in April 2012 at the [facility].  Flu shot was administered October 2012.    HISTORY:  I saw Yianni Skilling today for followup of his of his kappa light chain multiple myeloma, ISS III, with diagnosis established in December 2012.  Mr. Holst is here with his significant other.  He was last seen by [place] on 09/02/2011.  He has continued to receive weekly chemotherapy with Velcade, Cytoxan and Decadron without any problems. He denies any worsening of the peripheral neuropathy that he had prior to chemotherapy in his feet.  He has not had any nausea or vomiting, diarrhea, mucositis.  He has had no significant hair loss.  His major problem has been persistent low back pain, which actually seems to have improved from a few months ago but is still bothersome.  He  takes about 3 Ultram for benefit.  He is certainly more active and is thinking about going back to work.  Previously he did work at the [employer]. He is driving.  He also notes some discomfort in the region of both temporomandibular joints.  If this continues, he may need to see his dentist or an ENT specialist.  PHYSICAL EXAMINATION:  Mr. Brockmann looks well.  Weight today is  202 pounds, which is stable.  Height 6 feet 3 inches.  Body surface area 2.2 sq. m.  Blood pressure today 151/88.  Other vital signs are normal.  Mr. Goodlin is currently not on any antihypertensives medicine.  In the past he did have a history of hypertension.   HEENT: There is no scleral icterus. Mouth and  pharynx are benign. There is no peripheral adenopathy palpable. Heart:  Normal. Lungs: Normal. Chest: There is a right-sided Port-A-Cath  that was placed on 09/05/11. Abdomen: With the patient sitting is benign.  Musculoskeletal: No skeletal tenderness to palpation. Extremities:  No edema. Neurologic Exam: Grossly normal.   LABORATORY DATA:  Today, white count 6.6, ANC 4.5, hemoglobin 11.7, hematocrit 34.2, platelets 185,000.  Blood counts over the past several weeks have been stable without any cytopenias.  Chemistries from 09/02/2011 notable for a BUN of 22, creatinine 1.39.  Quantitative immunoglobulin on 07/20/2011:  IgG was low at 508, IgA was low at 43, IgM low at 25, IgD was less than 2 and IgE was 4.9, which is normal. Urine protein to creatinine ratio on 09/02/2011 was 0.08 with normal being less than 0.15.  IMAGING STUDIES:  1. Two-view chest x-ray on 06/28/2011 shows some mild bronchitic  changes.  2. Metastatic bone survey on 07/04/2011 showed no lytic lesions in  the visualized axial or appendicular skeleton. There was mild  superior end-plate compression deformities at T12 and L3.  3. CT-guided left iliac bone marrow aspirate and core biopsy was  carried out on 07/08/2011.   IMPRESSION AND PLAN:  In general, Mr. Denomme is doing extremely well. He has had an outstanding response as evidenced by dramatic improvement in his renal function, blood counts and normalization of his calcium. His urine protein to creatinine ratio was normal.  The urinary protein determination is confusing, as was noted in my note of 09/22/2011.  Mr. Uher has been receiving weekly  chemotherapy as described above.  We will continue with weekly chemotherapy as follows:  Velcade 2.90 mg, Cytoxan 300 mg, and Decadron 20 mg IV, continued on a weekly basis, preceded by a CBC.  Because of his ongoing back pain requiring Ultram, we are going to go ahead with an MRI of the lumbar spine, where he localizes his pain. Because of his renal dysfunction, we will omit IV contrast.  Depending on the results of that study, we can decide whether Mr. Ranganathan would benefit from some localized radiation to the lumbar spine versus consideration of kyphoplasty since he did have some mild compression fractures a few months ago at the time of diagnosis.  We had tried to arrange for Mr. Bathgate to see Dr. Barbaraann Boys at [facility]; however, that appointment did not get arranged for some reason.  We will persist.  Mr. Jent and his significant other know to call us if they do not he obtain a date for that appointment within the next week.  Will plan to see Mr. Marsalis again in about 5 weeks, which will be a Friday, April 5th.  At that time we will check  CBC, chemistries, LDH, uric acid and a urine protein to creatinine ratio.  The CBCs were ordered under the [employer] ordering option.  All in all, I am very pleased with how Mr. Maland has done.    ______________________________ Samul Dada, M.D. DSM/MEDQ  D:  09/25/2011  T:  09/25/2011  Job:  [id number]

## 2011-09-25 NOTE — Progress Notes (Unsigned)
Put aflac forms on nurse's desk. °

## 2011-09-25 NOTE — Patient Instructions (Signed)
[facility] Discharge Instructions for Patients Receiving Chemotherapy  Today you received the following chemotherapy agents Cytoxan, Velcade.  To help prevent nausea and vomiting after your treatment, we encourage you to take your nausea medication as prescribed by your physician.  If you develop nausea and vomiting that is not controlled by your nausea medication, call the clinic. If it is after clinic hours your family physician or the after hours number for the clinic or go to the Emergency Department.   BELOW ARE SYMPTOMS THAT SHOULD BE REPORTED IMMEDIATELY:  *FEVER GREATER THAN 100.5 F  *CHILLS WITH OR WITHOUT FEVER  NAUSEA AND VOMITING THAT IS NOT CONTROLLED WITH YOUR NAUSEA MEDICATION  *UNUSUAL SHORTNESS OF BREATH  *UNUSUAL BRUISING OR BLEEDING  TENDERNESS IN MOUTH AND THROAT WITH OR WITHOUT PRESENCE OF ULCERS  *URINARY PROBLEMS  *BOWEL PROBLEMS  UNUSUAL RASH Items with * indicate a potential emergency and should be followed up as soon as possible.  Feel free to call the clinic you have any questions or concerns. The clinic phone number is [phone].   I have been informed and understand all the instructions given to me. I know to contact the clinic, my physician, or go to the Emergency Department if any problems should occur. I do not have any questions at this time, but understand that I may call the clinic during office hours   should I have any questions or need assistance in obtaining follow up care.    __________________________________________  _____________  __________ Signature of Patient or Authorized Representative            Date                   Time    __________________________________________ Nurse's Signature

## 2011-09-28 ENCOUNTER — Encounter: Payer: Self-pay | Admitting: Oncology

## 2011-09-28 NOTE — Progress Notes (Signed)
Put patient's aflac forms in registration desk.

## 2011-09-30 ENCOUNTER — Ambulatory Visit (HOSPITAL_COMMUNITY)
Admission: RE | Admit: 2011-09-30 | Discharge: 2011-09-30 | Disposition: A | Discharge status: Home / Self Care | Payer: Medicare Other | Source: Ambulatory Visit | Attending: Oncology | Admitting: Oncology

## 2011-09-30 ENCOUNTER — Telehealth: Payer: Self-pay | Admitting: Oncology

## 2011-09-30 DIAGNOSIS — C9000 Multiple myeloma not having achieved remission: Secondary | ICD-10-CM | POA: Insufficient documentation

## 2011-09-30 DIAGNOSIS — R209 Unspecified disturbances of skin sensation: Secondary | ICD-10-CM | POA: Insufficient documentation

## 2011-09-30 DIAGNOSIS — M51379 Other intervertebral disc degeneration, lumbosacral region without mention of lumbar back pain or lower extremity pain: Secondary | ICD-10-CM | POA: Insufficient documentation

## 2011-09-30 DIAGNOSIS — Z8546 Personal history of malignant neoplasm of prostate: Secondary | ICD-10-CM | POA: Insufficient documentation

## 2011-09-30 DIAGNOSIS — M129 Arthropathy, unspecified: Secondary | ICD-10-CM | POA: Insufficient documentation

## 2011-09-30 DIAGNOSIS — D1809 Hemangioma of other sites: Secondary | ICD-10-CM | POA: Insufficient documentation

## 2011-09-30 DIAGNOSIS — N289 Disorder of kidney and ureter, unspecified: Secondary | ICD-10-CM | POA: Insufficient documentation

## 2011-09-30 DIAGNOSIS — M545 Low back pain, unspecified: Secondary | ICD-10-CM | POA: Insufficient documentation

## 2011-09-30 DIAGNOSIS — M47817 Spondylosis without myelopathy or radiculopathy, lumbosacral region: Secondary | ICD-10-CM | POA: Insufficient documentation

## 2011-09-30 DIAGNOSIS — S32009A Unspecified fracture of unspecified lumbar vertebra, initial encounter for closed fracture: Secondary | ICD-10-CM | POA: Insufficient documentation

## 2011-09-30 DIAGNOSIS — M5137 Other intervertebral disc degeneration, lumbosacral region: Secondary | ICD-10-CM | POA: Insufficient documentation

## 2011-09-30 DIAGNOSIS — X58XXXA Exposure to other specified factors, initial encounter: Secondary | ICD-10-CM | POA: Insufficient documentation

## 2011-09-30 NOTE — Telephone Encounter (Signed)
Pt. Appt. With Dr. Barbaraann Boys @ [facility] is 10/08/11 @ 10:00. Medical records faxed. Pt is aware.

## 2011-10-01 ENCOUNTER — Telehealth: Payer: Self-pay | Admitting: Medical Oncology

## 2011-10-01 ENCOUNTER — Other Ambulatory Visit: Payer: Self-pay | Admitting: Oncology

## 2011-10-01 NOTE — Telephone Encounter (Signed)
I called pt per Dr. Arline Asp to inform his MIR shows progression of compressions fractures. He has made referrals to be evaluated for kyphoplasty and radiation oncology. He is  aware that our office should call him with these appointments.

## 2011-10-02 ENCOUNTER — Encounter: Payer: Self-pay | Admitting: *Deleted

## 2011-10-02 ENCOUNTER — Other Ambulatory Visit (HOSPITAL_BASED_OUTPATIENT_CLINIC_OR_DEPARTMENT_OTHER): Payer: Medicare Other

## 2011-10-02 ENCOUNTER — Ambulatory Visit (HOSPITAL_BASED_OUTPATIENT_CLINIC_OR_DEPARTMENT_OTHER): Payer: Medicare Other

## 2011-10-02 DIAGNOSIS — Z5111 Encounter for antineoplastic chemotherapy: Secondary | ICD-10-CM

## 2011-10-02 DIAGNOSIS — C9000 Multiple myeloma not having achieved remission: Secondary | ICD-10-CM

## 2011-10-02 DIAGNOSIS — Z5112 Encounter for antineoplastic immunotherapy: Secondary | ICD-10-CM

## 2011-10-02 LAB — CBC WITH DIFFERENTIAL/PLATELET
BASO%: 0.3 % (ref 0.0–2.0)
Basophils Absolute: 0 10*3/uL (ref 0.0–0.1)
EOS%: 3.7 % (ref 0.0–7.0)
HCT: 33.1 % — ABNORMAL LOW (ref 38.4–49.9)
HGB: 11.4 g/dL — ABNORMAL LOW (ref 13.0–17.1)
MCH: 33.5 pg — ABNORMAL HIGH (ref 27.2–33.4)
MCHC: 34.3 g/dL (ref 32.0–36.0)
MCV: 97.6 fL (ref 79.3–98.0)
MONO%: 11.1 % (ref 0.0–14.0)
NEUT%: 69.8 % (ref 39.0–75.0)

## 2011-10-02 LAB — BASIC METABOLIC PANEL
BUN: 19 mg/dL (ref 6–23)
Calcium: 9.1 mg/dL (ref 8.4–10.5)
Creatinine, Ser: 1.3 mg/dL (ref 0.50–1.35)

## 2011-10-02 MED ORDER — SODIUM CHLORIDE 0.9 % IJ SOLN
10.0000 mL | INTRAMUSCULAR | Status: DC | PRN
  Administered 2011-10-02: 10 mL
  Filled 2011-10-02: qty 10

## 2011-10-02 MED ORDER — ONDANSETRON 8 MG/50ML IVPB (CHCC)
8.0000 mg | Freq: Once | INTRAVENOUS | Status: AC
  Administered 2011-10-02: 8 mg via INTRAVENOUS

## 2011-10-02 MED ORDER — HEPARIN SOD (PORK) LOCK FLUSH 100 UNIT/ML IV SOLN
500.0000 [IU] | Freq: Once | INTRAVENOUS | Status: AC | PRN
  Administered 2011-10-02: 500 [IU]
  Filled 2011-10-02: qty 5

## 2011-10-02 MED ORDER — SODIUM CHLORIDE 0.9 % IV SOLN
140.0000 mg/m2 | Freq: Once | INTRAVENOUS | Status: AC
  Administered 2011-10-02: 300 mg via INTRAVENOUS
  Filled 2011-10-02: qty 15

## 2011-10-02 MED ORDER — DEXAMETHASONE SODIUM PHOSPHATE 10 MG/ML IJ SOLN
20.0000 mg | Freq: Once | INTRAMUSCULAR | Status: AC
  Administered 2011-10-02: 20 mg via INTRAVENOUS

## 2011-10-02 MED ORDER — BORTEZOMIB CHEMO IV INJECTION 3.5 MG
1.3000 mg/m2 | Freq: Once | INTRAMUSCULAR | Status: AC
  Administered 2011-10-02: 2.9 mg via INTRAVENOUS
  Filled 2011-10-02: qty 2.9

## 2011-10-02 MED ORDER — SODIUM CHLORIDE 0.9 % IV SOLN
Freq: Once | INTRAVENOUS | Status: AC
  Administered 2011-10-02: 12:00:00 via INTRAVENOUS

## 2011-10-02 NOTE — Patient Instructions (Signed)
[facility] Discharge Instructions for Patients Receiving Chemotherapy  Today you received the following chemotherapy agents  CYTOXAN,VELCADE To help prevent nausea and vomiting after your treatment, we encourage you to take your nausea medication  Begin taking it at  as often as prescribed    If you develop nausea and vomiting that is not controlled by your nausea medication, call the clinic. If it is after clinic hours your family physician or the after hours number for the clinic or go to the Emergency Department.   BELOW ARE SYMPTOMS THAT SHOULD BE REPORTED IMMEDIATELY:  *FEVER GREATER THAN 100.5 F  *CHILLS WITH OR WITHOUT FEVER  NAUSEA AND VOMITING THAT IS NOT CONTROLLED WITH YOUR NAUSEA MEDICATION  *UNUSUAL SHORTNESS OF BREATH  *UNUSUAL BRUISING OR BLEEDING  TENDERNESS IN MOUTH AND THROAT WITH OR WITHOUT PRESENCE OF ULCERS  *URINARY PROBLEMS  *BOWEL PROBLEMS  UNUSUAL RASH Items with * indicate a potential emergency and should be followed up as soon as possible.  One of the nurses will contact you 24 hours after your treatment. Please let the nurse know about any problems that you may have experienced. Feel free to call the clinic you have any questions or concerns. The clinic phone number is [phone].   I have been informed and understand all the instructions given to me. I know to contact the clinic, my physician, or go to the Emergency Department if any problems should occur. I do not have any questions at this time, but understand that I may call the clinic during office hours   should I have any questions or need assistance in obtaining follow up care.    __________________________________________  _____________  __________ Signature of Patient or Authorized Representative            Date                   Time    __________________________________________ Nurse's Signature

## 2011-10-05 ENCOUNTER — Encounter: Payer: Self-pay | Admitting: Medical Oncology

## 2011-10-05 ENCOUNTER — Encounter: Payer: Self-pay | Admitting: *Deleted

## 2011-10-05 ENCOUNTER — Ambulatory Visit
Admission: RE | Admit: 2011-10-05 | Discharge: 2011-10-05 | Disposition: A | Discharge status: Home / Self Care | Payer: Medicare Other | Source: Ambulatory Visit | Attending: Radiation Oncology | Admitting: Radiation Oncology

## 2011-10-05 ENCOUNTER — Encounter: Payer: Self-pay | Admitting: Radiation Oncology

## 2011-10-05 VITALS — BP 132/85 | HR 91 | Temp 98.0°F | Resp 20 | Ht 73.5 in | Wt 201.4 lb

## 2011-10-05 DIAGNOSIS — E785 Hyperlipidemia, unspecified: Secondary | ICD-10-CM | POA: Insufficient documentation

## 2011-10-05 DIAGNOSIS — C61 Malignant neoplasm of prostate: Secondary | ICD-10-CM

## 2011-10-05 DIAGNOSIS — I10 Essential (primary) hypertension: Secondary | ICD-10-CM | POA: Insufficient documentation

## 2011-10-05 DIAGNOSIS — S22009A Unspecified fracture of unspecified thoracic vertebra, initial encounter for closed fracture: Secondary | ICD-10-CM | POA: Insufficient documentation

## 2011-10-05 DIAGNOSIS — C801 Malignant (primary) neoplasm, unspecified: Secondary | ICD-10-CM

## 2011-10-05 DIAGNOSIS — Z8546 Personal history of malignant neoplasm of prostate: Secondary | ICD-10-CM | POA: Insufficient documentation

## 2011-10-05 DIAGNOSIS — Z87891 Personal history of nicotine dependence: Secondary | ICD-10-CM | POA: Insufficient documentation

## 2011-10-05 DIAGNOSIS — S32000A Wedge compression fracture of unspecified lumbar vertebra, initial encounter for closed fracture: Secondary | ICD-10-CM

## 2011-10-05 DIAGNOSIS — Z9079 Acquired absence of other genital organ(s): Secondary | ICD-10-CM | POA: Insufficient documentation

## 2011-10-05 DIAGNOSIS — S32009A Unspecified fracture of unspecified lumbar vertebra, initial encounter for closed fracture: Secondary | ICD-10-CM | POA: Insufficient documentation

## 2011-10-05 DIAGNOSIS — X58XXXA Exposure to other specified factors, initial encounter: Secondary | ICD-10-CM | POA: Insufficient documentation

## 2011-10-05 DIAGNOSIS — Z79899 Other long term (current) drug therapy: Secondary | ICD-10-CM | POA: Insufficient documentation

## 2011-10-05 DIAGNOSIS — Z8051 Family history of malignant neoplasm of kidney: Secondary | ICD-10-CM | POA: Insufficient documentation

## 2011-10-05 DIAGNOSIS — C9000 Multiple myeloma not having achieved remission: Secondary | ICD-10-CM | POA: Insufficient documentation

## 2011-10-05 NOTE — Progress Notes (Signed)
Radiation Oncology         [phone]) [phone] ________________________________  Initial outpatient Consultation  Name: [name] MRN: [id number]  Date: 10/12/2011  DOB: 05/30/1946  CC:[name], MD, MD  Murinson, Gerarda Fraction, MD   REFERRING PHYSICIAN: Murinson, Gerarda Fraction, MD  DIAGNOSIS: 66 year old gentleman with lumbar compression fractures in the setting of multiple myeloma  HISTORY OF PRESENT ILLNESS::[name] is a 66 y.o. male with whom I met in 2008 prior to and following prostatectomy for prostate cancer which was notable for a positive surgical margin. We talked about the possibility of radiation treatment that time and the patient elected to pursue close surveillance. He has maintained biochemical control and never shown any evidence of biochemical or clinical prostate cancer recurrence. However, he did develop multiple myeloma and has been under the care of Dr. Kimberlee Nearing for this.   Recently he presented with increased low back pain and lumbar MRI performed on March 6 demonstrated progressive collapse of the L3 vertebral body with 75% loss of height and 6 mm of bony retropulsion into the spinal canal. There is also progressive collapse of the L2 vertebral body with a 50% loss of height in 2-3 mm of bony retropulsion. At the T12 level, patient was noted to have a 50-60% loss of vertebral height with minimal 2 mm bony retropulsion. Patient is currently been referred today for discussion of possible radiation treatment for palliation of pain and prevent cord compression area however, is not clear whether his compression fractures are directly caused by multiple myeloma or rather related to osteoporotic fractures. In addition today's visit for consultation, the patient plans to meet with the [facility] transplant team later this week to discuss marrow transplant in the treatment of his myeloma. He is also scheduled to meet with interventional radiology for possible treatment approaches using  verteboplasty as well as biopsy.  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    PAST SURGICAL HISTORY: Past Surgical History  Procedure Date  . Kidney stone surgery   . Kidney stone surgery 06/2009    Stone removed from bladder  . Dexa 05/2011    spine 0.7, L femur 0.0, R femur -0.6, normal  . Prostatectomy 04/06/07  . Portacath placement 08/05/11    right sided portacath    FAMILY HISTORY: family history includes Cancer in his brother; Kidney cancer in his sister; Pneumonia in his mother; and Stroke in his father.  SOCIAL HISTORY:  reports that he quit smoking about 15 years ago. He does not have any smokeless tobacco history on file. He reports that he drinks alcohol. He reports that he does not use illicit drugs.  ALLERGIES: Celebrex; Meloxicam; Percocet; and Vicodin  MEDICATIONS:  Current Outpatient Prescriptions  Medication Sig Dispense Refill  . acyclovir (ZOVIRAX) 800 MG tablet Take 1 tablet (800 mg total) by mouth 2 (two) times daily.  180 tablet  3  . allopurinol (ZYLOPRIM) 100 MG tablet Take 1 tablet (100 mg total) by mouth daily. Take  1 and 1/2 tab = 150 mg daily or as directed  135 tablet  3  . lidocaine-prilocaine (EMLA) cream Apply topically as needed.  30 g  3  . Multiple Vitamin (MULTIVITAMIN)  tablet Take 1 tablet by mouth daily.  30 tablet  0  . pantoprazole (PROTONIX) 40 MG tablet Take 1 tablet (40 mg total) by mouth daily at 12 noon.  90 tablet  3  . Probiotic Product (PROBIOTIC PO) Take by mouth.      . simvastatin (ZOCOR) 40 MG tablet Take 0.5 tablets (20 mg total)  by mouth at bedtime.  90 tablet  3  . traMADol (ULTRAM) 50 MG tablet Take 1 tablet (50 mg total) by mouth every 4 (four) hours as needed for pain.  90 tablet  3  . zolpidem (AMBIEN) 5 MG tablet Take 1 tablet (5 mg total) by mouth at bedtime as needed for sleep (insomnia).  30 tablet  0  . acetaminophen (TYLENOL) 650 MG CR tablet Take 650 mg by mouth 2 (two) times daily.        REVIEW OF SYSTEMS:  A 15 point review of systems is documented in the electronic medical record. This was obtained by the nursing staff. However, I reviewed this with the patient to discuss relevant findings and make appropriate changes.  A comprehensive review of systems was negative. except for progressive back pain as well as tingling involving the thighs.   PHYSICAL EXAM:  height is 6' 1.5" (1.867 m) and weight is 201 lb 6.4 oz (91.354 kg). His oral temperature is 98 F (36.7 C). His blood pressure is 132/85 and his pulse is 91. His respiration is 20.   Motor strength and light touch sensation are intact in the lower extremities. Musculoskeletal exam reveals no areas of focal bony tenderness. Patient does localize back pain to the mid lumbar spine.  LABORATORY DATA:  Lab Results  Component Value Date   WBC 5.6 10/09/2011   HGB 11.7* 10/09/2011   HCT 33.1* 10/09/2011   MCV 93.8 10/09/2011   PLT 208 10/09/2011   Lab Results  Component Value Date   NA 140 10/02/2011   K 4.1 10/02/2011   CL 105 10/02/2011   CO2 28 10/02/2011   Lab Results  Component Value Date   ALT 12 09/25/2011   AST 17 09/25/2011   ALKPHOS 108 09/25/2011   BILITOT 0.4 09/25/2011     RADIOGRAPHY: Mr Lumbar Spine Wo Contrast  10/01/2011  *RADIOLOGY REPORT*  Clinical Data: Low back pain with tingling in both legs.  History prostate cancer in multiple myeloma.  History of compression fractures.  Renal insufficiency.  MRI LUMBAR SPINE WITHOUT CONTRAST  Technique:  Multiplanar and multiecho pulse sequences of the lumbar spine were obtained without intravenous  contrast.  Comparison: Multiple exams, including 07/06/2011 and 07/04/2011  Findings: The lowest full intervertebral disk space is labeled L5- S1.  If procedural intervention is to be performed, careful correlation with this numbering strategy is recommended.  The conus medullaris appears unremarkable.  Conus level:  L1.  Progressive collapse of the L3 vertebral body noted, with 75% loss of vertebral body height (previously approximately 20% loss of height on 07/04/2011).  There is 6 mm of bony retropulsion.  There also progressive collapse of the L2 vertebral body noted, with 50% loss of height (formerly approximately 20% loss).  There is 2-3 mm of bony retropulsion.  50-60% loss of vertebral body heights noted at T12, minimally increased from prior, with 2 mm of bony retropulsion.  A 2.3 cm hemangioma is present in the L1 vertebral body.  Scattered heterogeneous lesions of varying size are scanned are present throughout the lumbosacral spine, compatible with malignancy.  Several  T2 hyperintense, T1 hypointense renal lesions noted; the findings are statistically likely to represent cysts but are not fully evaluated on today's lumbar spine MRI.  Additional findings at individual levels are as follows:  L1-2:  Mild facet arthropathy.  Minimal disc bulge.  No impingement.  L2-3:  The posterior retropulsion from L3 narrows the AP diameter of the thecal sac to 6 mm compatible with prominent central stenosis.  Facet arthropathy causes mild bilateral foraminal stenosis.  L3-4:  Facet arthropathy is observed, causing mild bilateral foraminal stenosis.  L4-5:  Bilateral facet arthropathy and posterior osseous ridging causing mild right and borderline left foraminal stenosis in addition to mild to moderate bilateral subarticular lateral recess stenosis. The AP diameter of the thecal sac is 10 mm, borderline for central stenosis.  L5-S1:  Bilateral facet arthropathy noted with mild disc bulge. There is borderline bilateral  foraminal stenosis and borderline bilateral subarticular lateral recess stenosis. The AP diameter of the thecal sac is 9 mm, compatible with mild central stenosis.  IMPRESSION:  1.  Progressive compression fractures at L2, L3, and T12.  Diffuse scattered osseous metastatic disease. Although these fractures may at least be partially due to malignancy, CMS compliance mandates the following statement:  Per CMS PQRS reporting requirements (PQRS Measure 24): Given the patient's age of greater than 50 and the fracture site (hip, distal radius, or spine), the patient should be tested for osteoporosis using DXA, and the appropriate treatment considered based on the DXA results.  2. Lumbar spondylosis and degenerative disc disease, causing varying degrees of impingement at all levels between L2 and S1.  Original Report Authenticated By: Dellia Cloud, M.D.      IMPRESSION: Mr. Corvin is a very nice 66 year-old gentleman with a history of multiple myeloma. He has progressive compression fractures involving the lumbar spine at L2, and L3. He also has involvement of the T12 vertebral level. His scans do demonstrate compression fractures but no overt tumor involvement per se. The patient may benefit from biopsy of the vertebral bodies with possible vertebroplasty. We will await the consultation with interventional radiology prior to providing treatment recommendations. It is not clear to me whether the patient is a candidate for vertebroplasty given the extent of retropulsion of bone into the spinal canal. The patient is not felt to be an ideal candidate for interventional stabilization, we could certainly consider a brief course of radiotherapy to the involved levels of disease following tissue confirmation.  PLAN: Today, I spent time talking with the patient about his findings and workup thus far. Talked about the role of radiation treatment management of multiple myeloma. We talked about the indications for  spinal radiotherapy as well as the anticipated goals. Talked about the possibility of interventional radiology procedures and compared and contrasted vertebroplasty with radiation therapy. We talked briefly about his upcoming bone marrow transplant consult. At this time, patient will proceed to visits described above. We'll follow up on his progress and potentially offer radiation treatment if clinically indicated. I spent 60 minutes minutes face to face with the patient and more than 50% of that time was spent in counseling and/or coordination of care.   ------------------------------------------------  Artist Pais. Kathrynn Running, M.D.

## 2011-10-05 NOTE — Progress Notes (Signed)
Multiple Myeloma ISS  III  dx  06/2011  Weekly Chemotherapy Velcade,Cytoxan,Decadron Hx Prostatectomy  Dx 2008 Progressive compression fractures L@,L#,T12 ,diffuse scattered osseous metastatic disease   Retired, 2 children, Penny,significant other Allergies  Vicodin   Percocet     ;Melixicam,= Itching,  celberex, =nausea  Pain a 2 lower  Lumbar/sacrum

## 2011-10-05 NOTE — Progress Notes (Signed)
Please see the Nurse Progress Note in the MD Initial Consult Encounter for this patient. 

## 2011-10-08 ENCOUNTER — Other Ambulatory Visit (HOSPITAL_COMMUNITY): Payer: Medicare Other

## 2011-10-09 ENCOUNTER — Ambulatory Visit (HOSPITAL_COMMUNITY): Payer: Medicare Other

## 2011-10-09 ENCOUNTER — Ambulatory Visit (HOSPITAL_BASED_OUTPATIENT_CLINIC_OR_DEPARTMENT_OTHER): Payer: Medicare Other

## 2011-10-09 ENCOUNTER — Other Ambulatory Visit (HOSPITAL_COMMUNITY): Payer: Self-pay | Admitting: Interventional Radiology

## 2011-10-09 ENCOUNTER — Other Ambulatory Visit (HOSPITAL_BASED_OUTPATIENT_CLINIC_OR_DEPARTMENT_OTHER): Payer: Medicare Other | Admitting: Lab

## 2011-10-09 ENCOUNTER — Ambulatory Visit (HOSPITAL_COMMUNITY)
Admission: RE | Admit: 2011-10-09 | Discharge: 2011-10-09 | Disposition: A | Discharge status: Home / Self Care | Payer: Medicare Other | Source: Ambulatory Visit | Attending: Oncology | Admitting: Oncology

## 2011-10-09 ENCOUNTER — Encounter: Payer: Self-pay | Admitting: Oncology

## 2011-10-09 DIAGNOSIS — Z5112 Encounter for antineoplastic immunotherapy: Secondary | ICD-10-CM

## 2011-10-09 DIAGNOSIS — C9000 Multiple myeloma not having achieved remission: Secondary | ICD-10-CM

## 2011-10-09 DIAGNOSIS — IMO0002 Reserved for concepts with insufficient information to code with codable children: Secondary | ICD-10-CM

## 2011-10-09 LAB — CBC WITH DIFFERENTIAL/PLATELET
BASO%: 1.1 % (ref 0.0–2.0)
LYMPH%: 23.8 % (ref 14.0–49.0)
MCHC: 35.3 g/dL (ref 32.0–36.0)
MONO#: 0.6 10*3/uL (ref 0.1–0.9)
RBC: 3.53 10*6/uL — ABNORMAL LOW (ref 4.20–5.82)
RDW: 14.6 % (ref 11.0–14.6)
WBC: 5.6 10*3/uL (ref 4.0–10.3)
lymph#: 1.3 10*3/uL (ref 0.9–3.3)
nRBC: 0 % (ref 0–0)

## 2011-10-09 MED ORDER — DEXAMETHASONE SODIUM PHOSPHATE 10 MG/ML IJ SOLN
20.0000 mg | Freq: Once | INTRAMUSCULAR | Status: AC
  Administered 2011-10-09: 20 mg via INTRAVENOUS

## 2011-10-09 MED ORDER — HEPARIN SOD (PORK) LOCK FLUSH 100 UNIT/ML IV SOLN
500.0000 [IU] | Freq: Once | INTRAVENOUS | Status: AC | PRN
  Administered 2011-10-09: 500 [IU]
  Filled 2011-10-09: qty 5

## 2011-10-09 MED ORDER — SODIUM CHLORIDE 0.9 % IV SOLN
Freq: Once | INTRAVENOUS | Status: AC
  Administered 2011-10-09: 10:00:00 via INTRAVENOUS

## 2011-10-09 MED ORDER — ONDANSETRON 8 MG/50ML IVPB (CHCC)
8.0000 mg | Freq: Once | INTRAVENOUS | Status: AC
  Administered 2011-10-09: 8 mg via INTRAVENOUS

## 2011-10-09 MED ORDER — BORTEZOMIB CHEMO IV INJECTION 3.5 MG
1.3000 mg/m2 | Freq: Once | INTRAMUSCULAR | Status: AC
  Administered 2011-10-09: 2.9 mg via INTRAVENOUS
  Filled 2011-10-09: qty 2.9

## 2011-10-09 MED ORDER — SODIUM CHLORIDE 0.9 % IJ SOLN
10.0000 mL | INTRAMUSCULAR | Status: DC | PRN
Start: 2011-10-09 — End: 2011-10-09
  Administered 2011-10-09: 10 mL
  Filled 2011-10-09: qty 10

## 2011-10-09 MED ORDER — SODIUM CHLORIDE 0.9 % IV SOLN
140.0000 mg/m2 | Freq: Once | INTRAVENOUS | Status: AC
  Administered 2011-10-09: 300 mg via INTRAVENOUS
  Filled 2011-10-09: qty 15

## 2011-10-12 ENCOUNTER — Encounter (HOSPITAL_COMMUNITY): Payer: Self-pay | Admitting: Pharmacy Technician

## 2011-10-12 ENCOUNTER — Other Ambulatory Visit: Payer: Self-pay | Admitting: Radiology

## 2011-10-12 ENCOUNTER — Encounter: Payer: Self-pay | Admitting: Radiation Oncology

## 2011-10-13 ENCOUNTER — Encounter: Payer: Self-pay | Admitting: *Deleted

## 2011-10-13 ENCOUNTER — Other Ambulatory Visit (HOSPITAL_COMMUNITY): Payer: Self-pay | Admitting: Interventional Radiology

## 2011-10-13 ENCOUNTER — Ambulatory Visit (HOSPITAL_COMMUNITY)
Admission: RE | Admit: 2011-10-13 | Discharge: 2011-10-13 | Disposition: A | Discharge status: Home / Self Care | Payer: Medicare Other | Source: Ambulatory Visit | Attending: Interventional Radiology | Admitting: Interventional Radiology

## 2011-10-13 ENCOUNTER — Telehealth (HOSPITAL_COMMUNITY): Payer: Self-pay

## 2011-10-13 DIAGNOSIS — IMO0002 Reserved for concepts with insufficient information to code with codable children: Secondary | ICD-10-CM

## 2011-10-13 DIAGNOSIS — I10 Essential (primary) hypertension: Secondary | ICD-10-CM | POA: Insufficient documentation

## 2011-10-13 DIAGNOSIS — M8448XA Pathological fracture, other site, initial encounter for fracture: Secondary | ICD-10-CM | POA: Insufficient documentation

## 2011-10-13 DIAGNOSIS — C9000 Multiple myeloma not having achieved remission: Secondary | ICD-10-CM | POA: Insufficient documentation

## 2011-10-13 DIAGNOSIS — Z8546 Personal history of malignant neoplasm of prostate: Secondary | ICD-10-CM | POA: Insufficient documentation

## 2011-10-13 LAB — COMPREHENSIVE METABOLIC PANEL
ALT: 18 U/L (ref 0–53)
Alkaline Phosphatase: 94 U/L (ref 39–117)
CO2: 29 mEq/L (ref 19–32)
GFR calc Af Amer: 64 mL/min — ABNORMAL LOW (ref >90)
GFR calc non Af Amer: 55 mL/min — ABNORMAL LOW (ref >90)
Glucose, Bld: 87 mg/dL (ref 70–99)
Potassium: 3.6 mEq/L (ref 3.5–5.1)
Sodium: 141 mEq/L (ref 135–145)

## 2011-10-13 LAB — CBC
Hemoglobin: 12.4 g/dL — ABNORMAL LOW (ref 13.0–17.0)
RBC: 3.74 MIL/uL — ABNORMAL LOW (ref 4.22–5.81)
WBC: 6.5 10*3/uL (ref 4.0–10.5)

## 2011-10-13 LAB — APTT: aPTT: 35 seconds (ref 24–37)

## 2011-10-13 MED ORDER — SODIUM CHLORIDE 0.9 % IV SOLN: INTRAVENOUS | Status: AC

## 2011-10-13 MED ORDER — SODIUM CHLORIDE 0.9 % IV SOLN: Freq: Once | INTRAVENOUS | Status: DC

## 2011-10-13 MED ORDER — CEFAZOLIN SODIUM 1-5 GM-% IV SOLN
1.0000 g | Freq: Once | INTRAVENOUS | Status: AC
  Administered 2011-10-13: 1 g via INTRAVENOUS

## 2011-10-13 MED ORDER — TOBRAMYCIN SULFATE 1.2 G IJ SOLR
INTRAMUSCULAR | Status: AC
  Filled 2011-10-13: qty 1.2

## 2011-10-13 MED ORDER — FENTANYL CITRATE 0.05 MG/ML IJ SOLN
INTRAMUSCULAR | Status: DC | PRN
  Administered 2011-10-13 (×3): 25 ug via INTRAVENOUS

## 2011-10-13 MED ORDER — HYDROMORPHONE HCL PF 1 MG/ML IJ SOLN
INTRAMUSCULAR | Status: DC | PRN
  Administered 2011-10-13: 1 mg

## 2011-10-13 MED ORDER — MIDAZOLAM HCL 2 MG/2ML IJ SOLN
INTRAMUSCULAR | Status: AC
  Filled 2011-10-13: qty 6

## 2011-10-13 MED ORDER — HYDROMORPHONE HCL PF 1 MG/ML IJ SOLN
INTRAMUSCULAR | Status: AC
  Filled 2011-10-13: qty 3

## 2011-10-13 MED ORDER — FENTANYL CITRATE 0.05 MG/ML IJ SOLN
INTRAMUSCULAR | Status: AC
  Filled 2011-10-13: qty 4

## 2011-10-13 MED ORDER — CEFAZOLIN SODIUM 1-5 GM-% IV SOLN
INTRAVENOUS | Status: AC
  Filled 2011-10-13: qty 50

## 2011-10-13 MED ORDER — MIDAZOLAM HCL 5 MG/5ML IJ SOLN
INTRAMUSCULAR | Status: DC | PRN
  Administered 2011-10-13 (×3): 1 mg via INTRAVENOUS

## 2011-10-13 NOTE — H&P (Signed)
[name] is an 66 y.o. male.   Chief Complaint: Multiple Myeloma; fractures of T12; L2; L3 Scheduled today for T12/L2/L3 Vertebroplasty vs Kyphoplasty with ablation at each level HPI: prostate ca; MM; htn  Past Medical History  Diagnosis Date  . HTN (hypertension)   . Allergic rhinitis   . Hyperlipemia   . Hypercalcemia of malignancy   . Multiple myeloma 07/09/2011  . Myeloma kidney   . Peripheral neuropathy     toes, sees Dr Terrace Arabia  . Multiple myeloma   . Fractures     compression T12,L3  . Anemia 07/2010    secondary to tx rsponsive to [facility]  . Prostate cancer 2008    s/p prostatectomy  . Prostate cancer 03/2002  . Renal insufficiency   . History of chemotherapy     weekly Velcade,Cytoxan  . 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 DDD (degenerative disc disease)     Past Surgical History  Procedure Date  . Kidney stone surgery   . Kidney stone surgery 06/2009    Stone removed from bladder  . Dexa 05/2011    spine 0.7, L femur 0.0, R femur -0.6, normal  . Prostatectomy 04/06/07  . Portacath placement 08/05/11    right sided portacath    Family History  Problem Relation Age of Onset  . Pneumonia Mother   . Stroke Father   . Cancer Brother     lung  . Kidney cancer Sister    Social History:  reports that he quit smoking about 15 years ago. He does not have any smokeless tobacco history on file. He reports that he drinks alcohol. He reports that he does not use illicit drugs.  Allergies:  Allergies  Allergen Reactions  . Celebrex (Celecoxib) Nausea Only  . Meloxicam Itching  . Percocet (Oxycodone-Acetaminophen) Itching  . Vicodin (Hydrocodone-Acetaminophen) Itching    Medications Prior to Admission  Medication Sig Dispense Refill  . acyclovir (ZOVIRAX) 800 MG tablet Take 800 mg by mouth 2 (two) times daily.      Marland Kitchen allopurinol (ZYLOPRIM) 100 MG tablet Take 150 mg by mouth daily.      . Lactobacillus (ACIDOPHILUS  PO) Take 1 tablet by mouth daily.      . Multiple Vitamin (MULITIVITAMIN WITH MINERALS) TABS Take 1 tablet by mouth daily.      . pantoprazole (PROTONIX) 40 MG tablet Take 40 mg by mouth daily at 12 noon.      . simvastatin (ZOCOR) 40 MG tablet Take 20 mg by mouth at bedtime.      . traMADol (ULTRAM) 50 MG tablet Take 50 mg by mouth every 4 (four) hours as needed. For pain      . acetaminophen (TYLENOL) 325 MG tablet Take 650 mg by mouth every 6 (six) hours as needed. For pain      . zolpidem (AMBIEN) 5 MG tablet Take 5 mg by mouth at bedtime as needed. For sleep       Medications Prior to Admission  Medication Dose Route Frequency Provider Last Rate Last Dose  . 0.9 %  sodium chloride infusion   Intravenous Once Robet Leu, PA      . ceFAZolin (ANCEF) 1-5 GM-% IVPB           . ceFAZolin (ANCEF) IVPB 1 g/50 mL premix  1 g Intravenous Once Robet Leu, PA        Results for orders placed during the  hospital encounter of 10/13/11 (from the past 48 hour(s))  CBC     Status: Abnormal   Collection Time   10/13/11  8:17 AM      Component Value Range Comment   WBC 6.5  4.0 - 10.5 (K/uL)    RBC 3.74 (*) 4.22 - 5.81 (MIL/uL)    Hemoglobin 12.4 (*) 13.0 - 17.0 (g/dL)    HCT 40.9 (*) 81.1 - 52.0 (%)    MCV 95.2  78.0 - 100.0 (fL)    MCH 33.2  26.0 - 34.0 (pg)    MCHC 34.8  30.0 - 36.0 (g/dL)    RDW 91.4  78.2 - 95.6 (%)    Platelets 203  150 - 400 (K/uL)    No results found.  Review of Systems  Constitutional: Negative for fever.  Respiratory: Negative for cough.   Cardiovascular: Negative for chest pain.  Gastrointestinal: Negative for nausea and vomiting.  Musculoskeletal: Positive for back pain.  Neurological: Negative for headaches.    Blood pressure 151/93, pulse 92, temperature 96.7 F (35.9 C), temperature source Oral, resp. rate 18, height 6' (1.829 m), weight 200 lb (90.719 kg), SpO2 95.00%. Physical Exam  Constitutional: He is oriented to person, place, and time. He  appears well-developed and well-nourished.  HENT:  Head: Normocephalic.  Eyes: EOM are normal.  Neck: Normal range of motion.  Cardiovascular: Normal rate, regular rhythm and normal heart sounds.   No murmur heard. Respiratory: Effort normal and breath sounds normal. He has no wheezes.  GI: Soft. Bowel sounds are normal. There is no tenderness.  Musculoskeletal: Normal range of motion.       Uses cane  Neurological: He is alert and oriented to person, place, and time.  Skin: Skin is warm.     Assessment/Plan Fractures at Thoracic 12; Lumbar 2 and lumbar 3 Scheduled for VP vs KP and ablation at each level with Dr Corliss [name] Pt aware of procedure benefits and risks and agreeable to proceed. Consent signed.  Kalima Saylor A 10/13/2011, 8:57 AM

## 2011-10-13 NOTE — Procedures (Signed)
S/P ablation with KP augmentation at T 12 ,L2 and L3

## 2011-10-13 NOTE — Discharge Instructions (Signed)
Vertebroplasty Vertebroplasty is a procedure used to treat collapsed bones (compression fractures) of the spine. Spine (vertebral) fractures can be painful and limit movement. Vertebroplasty stabilizes the fracture by injecting bone cement into the collapsed bone. This restores the vertebra and helps prevent further collapse.  LET YOUR CAREGIVER KNOW ABOUT:  Any allergies you have to medicines, tape, or other products.   All prescription medicines you are taking, as well as:   Herbs.   Over-the-counter medicines and creams.   Eyedrops.   Steroid medicine.   Blood thinners (anticoagulants), aspirin, or other drugs that affect blood clotting.   Problems with anesthesia, including local anesthetics.   Any history of bleeding problems or blood clots.   Breathing problems and smoking history.   Previous surgery.   Any recent colds or infections.   RISKS AND COMPLICATIONS All procedures have risks. General complications of any procedure can include bleeding, infections, blood clots, and reactions to anesthesia. Problems with vertebroplasty do not occur often, but they can include:  Bone cement leakage.   Nerve damage.   Infection.   Need for another surgery.    Paralysis. This is very rare. Some people are at higher risk than others for this complication to occur. Your particular risks should be discussed with your caregiver.  BEFORE THE PROCEDURE  Discuss your risk for more compression fractures in the future with your caregiver.   Ask your caregiver about the difference between vertebroplasty and kyphoplasty and which is more appropriate for you.   You may need to take medicine to help make your bones stronger. This will help prepare you for the procedure.   Ask your caregiver about changing or stopping any medicines you are on before the procedure.   If you take blood thinners, ask your caregiver when you should stop taking them.   Do not eat or drink for 8 hours  before your procedure or as told by your caregiver.   You might be asked to shower or wash at home with a soap that kills skin bacteria.   Arrive at least 1 hour before the procedure or as directed.   Vertebroplasty is often an outpatient procedure. This means you will be able to go home the same day.Make arrangements in advance for someone to drive you home and stay with you for 24 hours.  PROCEDURE  You will lie face down for the procedure.   You will be given an intravenous (IV) line. Medicine to help you relax will be given through the IV.   Medicine that numbs the area (local anesthetic) will be injected into the skin where the fractured vertebra is. A small cut (incision) is then made where the fractured vertebra is.   A hollow needle is inserted through the incision. An X-ray machine (fluoroscope) is used to guide the needle to the fractured vertebrae.   Bone cement is put through the hollow needle into the fractured vertebra. It hardens in about 20 minutes.   A bandage (dressing) is put over the incision site.  AFTER THE PROCEDURE  You will stay in a recovery area until you are awake enough to eat and drink.   Most people go home the same day.  Document Released: 03/11/2011 Document Revised: 07/02/2011 Document Reviewed: 03/11/2011 [facility] Patient Information 2012 [employer], [place]. 1. Use walker for 2 weeks  2. No stooping ,bending or lifting more than 10 lbs for 2 weeks  3. Avoid driving for 2 weeks . 4. RTC in 2 weeks

## 2011-10-15 ENCOUNTER — Other Ambulatory Visit (HOSPITAL_COMMUNITY): Payer: Self-pay | Admitting: Interventional Radiology

## 2011-10-15 ENCOUNTER — Telehealth (HOSPITAL_COMMUNITY): Payer: Self-pay

## 2011-10-15 DIAGNOSIS — IMO0002 Reserved for concepts with insufficient information to code with codable children: Secondary | ICD-10-CM

## 2011-10-15 NOTE — Telephone Encounter (Signed)
Spoke w/ pt.  His f/u appointment is set for 10-27-11 @ 1pm.

## 2011-10-16 ENCOUNTER — Ambulatory Visit (HOSPITAL_BASED_OUTPATIENT_CLINIC_OR_DEPARTMENT_OTHER): Payer: Medicare Other

## 2011-10-16 ENCOUNTER — Other Ambulatory Visit (HOSPITAL_BASED_OUTPATIENT_CLINIC_OR_DEPARTMENT_OTHER): Payer: Medicare Other | Admitting: Lab

## 2011-10-16 DIAGNOSIS — C9000 Multiple myeloma not having achieved remission: Secondary | ICD-10-CM

## 2011-10-16 DIAGNOSIS — Z5112 Encounter for antineoplastic immunotherapy: Secondary | ICD-10-CM

## 2011-10-16 LAB — CBC WITH DIFFERENTIAL/PLATELET
Basophils Absolute: 0 10*3/uL (ref 0.0–0.1)
Eosinophils Absolute: 0.2 10*3/uL (ref 0.0–0.5)
HCT: 34.3 % — ABNORMAL LOW (ref 38.4–49.9)
HGB: 11.7 g/dL — ABNORMAL LOW (ref 13.0–17.1)
LYMPH%: 12.3 % — ABNORMAL LOW (ref 14.0–49.0)
MCV: 96.1 fL (ref 79.3–98.0)
MONO#: 1.3 10*3/uL — ABNORMAL HIGH (ref 0.1–0.9)
MONO%: 18.1 % — ABNORMAL HIGH (ref 0.0–14.0)
NEUT#: 4.5 10*3/uL (ref 1.5–6.5)
Platelets: 199 10*3/uL (ref 140–400)
RBC: 3.57 10*6/uL — ABNORMAL LOW (ref 4.20–5.82)
WBC: 6.9 10*3/uL (ref 4.0–10.3)
nRBC: 0 % (ref 0–0)

## 2011-10-16 LAB — BASIC METABOLIC PANEL
CO2: 25 mEq/L (ref 19–32)
Calcium: 9.3 mg/dL (ref 8.4–10.5)
Chloride: 102 mEq/L (ref 96–112)
Creatinine, Ser: 1.22 mg/dL (ref 0.50–1.35)
Glucose, Bld: 133 mg/dL — ABNORMAL HIGH (ref 70–99)

## 2011-10-16 MED ORDER — SODIUM CHLORIDE 0.9 % IV SOLN
140.0000 mg/m2 | Freq: Once | INTRAVENOUS | Status: AC
  Administered 2011-10-16: 300 mg via INTRAVENOUS
  Filled 2011-10-16: qty 15

## 2011-10-16 MED ORDER — HEPARIN SOD (PORK) LOCK FLUSH 100 UNIT/ML IV SOLN
500.0000 [IU] | Freq: Once | INTRAVENOUS | Status: AC | PRN
  Administered 2011-10-16: 500 [IU]
  Filled 2011-10-16: qty 5

## 2011-10-16 MED ORDER — BORTEZOMIB CHEMO IV INJECTION 3.5 MG
1.3000 mg/m2 | Freq: Once | INTRAMUSCULAR | Status: AC
  Administered 2011-10-16: 2.9 mg via INTRAVENOUS
  Filled 2011-10-16: qty 2.9

## 2011-10-16 MED ORDER — DEXAMETHASONE SODIUM PHOSPHATE 10 MG/ML IJ SOLN
20.0000 mg | Freq: Once | INTRAMUSCULAR | Status: AC
  Administered 2011-10-16: 20 mg via INTRAVENOUS

## 2011-10-16 MED ORDER — ONDANSETRON 8 MG/50ML IVPB (CHCC)
8.0000 mg | Freq: Once | INTRAVENOUS | Status: AC
  Administered 2011-10-16: 8 mg via INTRAVENOUS

## 2011-10-16 MED ORDER — SODIUM CHLORIDE 0.9 % IV SOLN
Freq: Once | INTRAVENOUS | Status: AC
  Administered 2011-10-16: 09:00:00 via INTRAVENOUS

## 2011-10-16 MED ORDER — SODIUM CHLORIDE 0.9 % IJ SOLN
10.0000 mL | INTRAMUSCULAR | Status: DC | PRN
  Administered 2011-10-16: 10 mL
  Filled 2011-10-16: qty 10

## 2011-10-16 NOTE — Patient Instructions (Signed)
[facility] Discharge Instructions for Patients Receiving Chemotherapy  Today you received the following chemotherapy agents Cytoxan and Velcade.  To help prevent nausea and vomiting after your treatment, we encourage you to take your nausea medication as ordered per MD.    If you develop nausea and vomiting that is not controlled by your nausea medication, call the clinic. If it is after clinic hours your family physician or the after hours number for the clinic or go to the Emergency Department.   BELOW ARE SYMPTOMS THAT SHOULD BE REPORTED IMMEDIATELY:  *FEVER GREATER THAN 100.5 F  *CHILLS WITH OR WITHOUT FEVER  NAUSEA AND VOMITING THAT IS NOT CONTROLLED WITH YOUR NAUSEA MEDICATION  *UNUSUAL SHORTNESS OF BREATH  *UNUSUAL BRUISING OR BLEEDING  TENDERNESS IN MOUTH AND THROAT WITH OR WITHOUT PRESENCE OF ULCERS  *URINARY PROBLEMS  *BOWEL PROBLEMS  UNUSUAL RASH Items with * indicate a potential emergency and should be followed up as soon as possible.  . Please let the nurse know about any problems that you may have experienced. Feel free to call the clinic you have any questions or concerns. The clinic phone number is [phone].   I have been informed and understand all the instructions given to me. I know to contact the clinic, my physician, or go to the Emergency Department if any problems should occur. I do not have any questions at this time, but understand that I may call the clinic during office hours   should I have any questions or need assistance in obtaining follow up care.    __________________________________________  _____________  __________ Signature of Patient or Authorized Representative            Date                   Time    __________________________________________ Nurse's Signature

## 2011-10-23 ENCOUNTER — Ambulatory Visit (HOSPITAL_BASED_OUTPATIENT_CLINIC_OR_DEPARTMENT_OTHER): Payer: Medicare Other

## 2011-10-23 ENCOUNTER — Other Ambulatory Visit: Payer: Self-pay | Admitting: Medical Oncology

## 2011-10-23 ENCOUNTER — Encounter: Payer: Self-pay | Admitting: *Deleted

## 2011-10-23 ENCOUNTER — Telehealth: Payer: Self-pay | Admitting: Oncology

## 2011-10-23 ENCOUNTER — Other Ambulatory Visit (HOSPITAL_BASED_OUTPATIENT_CLINIC_OR_DEPARTMENT_OTHER): Payer: Medicare Other | Admitting: Lab

## 2011-10-23 ENCOUNTER — Encounter: Payer: Self-pay | Admitting: Oncology

## 2011-10-23 ENCOUNTER — Ambulatory Visit: Payer: Medicare Other | Admitting: Oncology

## 2011-10-23 VITALS — BP 148/82 | HR 93 | Temp 97.2°F | Ht 72.0 in | Wt 199.7 lb

## 2011-10-23 DIAGNOSIS — D631 Anemia in chronic kidney disease: Secondary | ICD-10-CM

## 2011-10-23 DIAGNOSIS — C801 Malignant (primary) neoplasm, unspecified: Secondary | ICD-10-CM

## 2011-10-23 DIAGNOSIS — G609 Hereditary and idiopathic neuropathy, unspecified: Secondary | ICD-10-CM

## 2011-10-23 DIAGNOSIS — C9000 Multiple myeloma not having achieved remission: Secondary | ICD-10-CM

## 2011-10-23 DIAGNOSIS — C61 Malignant neoplasm of prostate: Secondary | ICD-10-CM

## 2011-10-23 DIAGNOSIS — Z5111 Encounter for antineoplastic chemotherapy: Secondary | ICD-10-CM

## 2011-10-23 DIAGNOSIS — Z8546 Personal history of malignant neoplasm of prostate: Secondary | ICD-10-CM

## 2011-10-23 DIAGNOSIS — N189 Chronic kidney disease, unspecified: Secondary | ICD-10-CM

## 2011-10-23 DIAGNOSIS — N039 Chronic nephritic syndrome with unspecified morphologic changes: Secondary | ICD-10-CM

## 2011-10-23 LAB — PSA: PSA: 0.27 ng/mL (ref <=4.00)

## 2011-10-23 LAB — CBC WITH DIFFERENTIAL/PLATELET
Basophils Absolute: 0 10*3/uL (ref 0.0–0.1)
Eosinophils Absolute: 0.2 10*3/uL (ref 0.0–0.5)
HCT: 34.6 % — ABNORMAL LOW (ref 38.4–49.9)
HGB: 12 g/dL — ABNORMAL LOW (ref 13.0–17.1)
MCV: 94.3 fL (ref 79.3–98.0)
MONO%: 16.5 % — ABNORMAL HIGH (ref 0.0–14.0)
NEUT#: 5.3 10*3/uL (ref 1.5–6.5)
NEUT%: 68.3 % (ref 39.0–75.0)
RDW: 13.9 % (ref 11.0–14.6)

## 2011-10-23 LAB — COMPREHENSIVE METABOLIC PANEL
ALT: 10 U/L (ref 0–53)
AST: 16 U/L (ref 0–37)
Alkaline Phosphatase: 70 U/L (ref 39–117)
Calcium: 9.1 mg/dL (ref 8.4–10.5)
Chloride: 102 mEq/L (ref 96–112)
Creatinine, Ser: 1.29 mg/dL (ref 0.50–1.35)

## 2011-10-23 LAB — URIC ACID: Uric Acid, Serum: 6.1 mg/dL (ref 4.0–7.8)

## 2011-10-23 LAB — PROTEIN / CREATININE RATIO, URINE

## 2011-10-23 MED ORDER — BORTEZOMIB CHEMO SQ INJECTION 3.5 MG (2.5MG/ML)
1.3000 mg/m2 | Freq: Once | INTRAMUSCULAR | Status: AC
  Administered 2011-10-23: 2.75 mg via SUBCUTANEOUS
  Filled 2011-10-23: qty 2.75

## 2011-10-23 MED ORDER — SODIUM CHLORIDE 0.9 % IV SOLN
Freq: Once | INTRAVENOUS | Status: AC
  Administered 2011-10-23: 11:00:00 via INTRAVENOUS

## 2011-10-23 MED ORDER — HEPARIN SOD (PORK) LOCK FLUSH 100 UNIT/ML IV SOLN
500.0000 [IU] | Freq: Once | INTRAVENOUS | Status: AC | PRN
  Administered 2011-10-23: 500 [IU]
  Filled 2011-10-23: qty 5

## 2011-10-23 MED ORDER — DEXAMETHASONE SODIUM PHOSPHATE 10 MG/ML IJ SOLN
20.0000 mg | Freq: Once | INTRAMUSCULAR | Status: AC
  Administered 2011-10-23: 20 mg via INTRAVENOUS

## 2011-10-23 MED ORDER — SODIUM CHLORIDE 0.9 % IJ SOLN
10.0000 mL | INTRAMUSCULAR | Status: DC | PRN
  Administered 2011-10-23: 10 mL
  Filled 2011-10-23: qty 10

## 2011-10-23 MED ORDER — SODIUM CHLORIDE 0.9 % IV SOLN
140.0000 mg/m2 | Freq: Once | INTRAVENOUS | Status: AC
  Administered 2011-10-23: 300 mg via INTRAVENOUS
  Filled 2011-10-23: qty 15

## 2011-10-23 MED ORDER — ONDANSETRON 8 MG/50ML IVPB (CHCC)
8.0000 mg | Freq: Once | INTRAVENOUS | Status: AC
  Administered 2011-10-23: 8 mg via INTRAVENOUS

## 2011-10-23 NOTE — Telephone Encounter (Signed)
Gv pts mother appts for [phone]

## 2011-10-23 NOTE — Progress Notes (Signed)
[facility] Psychosocial Distress Screening Clinical Social Work  Clinical Social Work was referred by distress screening protocol.  The patient scored a 7 on the Psychosocial Distress Thermometer which indicates moderate distress. Clinical Social Worker attempted to contact patient to assess for distress and other psychosocial needs. CSW left patient a VM to return call at earliest convenience.  Clinical Social Worker follow up needed: no  If yes, follow up plan:  Kathrin Penner, MSW, [facility] Clinical Social Worker [place] [phone]

## 2011-10-23 NOTE — Progress Notes (Signed)
This office note has been dictated.  #[id number]

## 2011-10-23 NOTE — Progress Notes (Signed)
CC:   [name], MD [name], M.D. [name], MD [name], M.D. [name], M.D. [name], MD    PROBLEM LIST:  1. Kappa light chain multiple myeloma, ISS III with diagnosis  established in December 2012 when the patient presented with marked  anemia, thrombocytopenia and extensive bone marrow replacement with  immature plasma cells/plasma blasts and circulating plasma cells in  the peripheral blood. Bone marrow was carried out on 07/08/2011.  In addition, the patient had severe renal insufficiency and  hypercalcemia. He was requiring transfusions of platelets and red  cells. He appeared to have limited bone disease characterized by  mild compression fractures of T12 and L3. A bone density scan done  a couple months preceding the diagnosis had been normal. Results  of cytogenetic analysis revealed the presence of 2 clonal cell  lines. The 1st cell line was chromosomally normal (20% of cells).  The 2nd cell line was cytogenetically abnormal and was hypodiploid  in nature with loss of chromosomes 4, 13, 14, 16, 17, 20 and 22.  There was a gain of chromosome 7 and a marker chromosome.  Chromosome 2 appeared to be in a rearrangement involving both its  short and long arms. FISH studies showed loss of chromosome 4, loss  of chromosome 14, loss of chromosome 13, and loss of chromosome 17,  i.e. 4 P-, 14 Q-, 13 Q- and 17 P-. These findings would be  consistent with abnormalities associated with multiple myeloma.  The patient has been receiving Velcade, Cytoxan and Decadron on a  weekly basis since these treatments were started on 07/10/2011.  2. Pancytopenia at diagnosis secondary to extensive marrow replacement  by multiple myeloma.  3. Renal insufficiency, markedly improved since diagnosis.  4. Hypercalcemia at presentation, now resolved following treatment.  5. Anemia which developed in mid January 2012 secondary to treatment    responsive to Aranesp.  6. History of prostate cancer dating back to September 2003 treated  with robotic assisted laparoscopic radical prostatectomy by Dr.  [name]. The tumor was pathologic T3a, involving both lobes  of the prostate with capsular extension and positive margin  focally. The patient did not receive postoperative radiation.  7. Hypertension.  8. Dyslipidemia.  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  10.Right-sided Port-A-Cath placement on 08/05/2011.  11. Kyphoplasty carried out by Dr. [name] on 10/13/2011     involving vertebral bodies  T12, L2 and L3.  MEDICATIONS:  1. Acyclovir 800 mg twice a day.  2. Allopurinol 150 mg daily.  3. EMLA cream.  4. Multivitamins.  5. Protonix.  6. Zocor 20 mg at bedtime.  7. Ultram 50 mg every 4 hours as needed for pain.  8. Ambien 5 mg at bedtime as needed.  9. Tylenol 650 mg twice a day.   Pneumovax was administered in April 2012 at the [facility].  Flu shot was administered October 2012.    HISTORY:  [name] was seen today for followup of his kappa light chain multiple myeloma, ISS III with diagnosis established in December 2012.  Royce is accompanied by Romeo Rabon.  He was last seen by [place] on 09/25/2011.  He has been quite busy over the past several weeks.  He underwent an MRI of the lumbar spine without IV contrast on 09/30/2011. This showed progressive compression fractures at L2, L3 and T12 along with diffuse scattered bone disease from his myeloma.  Bode had a  consultation with Dr. Margaretmary Bayley who decided to defer any radiation treatment.  He also saw Dr. [name] who ultimately carried out kyphoplasty on 10/13/2011 involving vertebral bodies T12, L2 and L3. The patient thinks that his pain may be a little better but he is still taking Ultram every 4-6 hours.  Levon has also seen Dr. Barbaraann Boys who is making arrangements for  high- dose chemotherapy and autologous stem cell rescue.  Arvind will be collecting a 24 hour urine.  He has an appointment with Dr. Barbaraann Boys on April 4.  Also April 17th and April 29.  He has an appointment with Dr. Fidela Salisbury on April 9.  Yostin has been tolerating his chemotherapy extremely well.  He has an underlying peripheral neuropathy and may be having a little more symptoms in the toes of his left foot.  For that reason, we will be making some modifications in his treatment program.  Aside from this, Monica is doing extremely well, increasingly active and without any new problems.  PHYSICAL EXAM:  He certainly looks well.  Weight is 199.7 pounds, height 6 feet even, body surface area 2.15 sq/m.  Blood pressure today 148/82. Other vital signs are normal.  Physical exam is really unchanged from prior exams.  No scleral icterus.  Mouth and pharynx benign.  No adenopathy.  Heart and lungs are normal.  There is a right-sided Port-A- Cath that was placed on 08/05/2011.  Abdomen with the patient sitting is benign.  No musculoskeletal tenderness.  No peripheral edema or clubbing.  Neurologic exam is grossly normal without obvious focal findings.  LABORATORY DATA:  Today white count 7.7, ANC.  5.3, hemoglobin 12.0, hematocrit 34.6, platelets 208,000.  Chemistries and urine protein to creatinine ratio are pending.  Chemistries from 10/13/2011 were notable for a BUN of 20, creatinine 1.32.  Uric acid on 03/01 was 5.3, albumin 3.8.  Liver function tests including LDH were normal.  The urine protein to creatinine ratio on 09/25/2011 was 0.03.   IMAGING STUDIES:  1. Two-view chest x-ray on 06/28/2011 shows some mild bronchitic  changes.  2. Metastatic bone survey on 07/04/2011 showed no lytic lesions in  the visualized axial or appendicular skeleton. There was mild  superior end-plate compression deformities at T12 and L3.  3. CT-guided left iliac bone marrow aspirate and core  biopsy was  carried out on 07/08/2011. 4. MRI of the lumbar spine without IV contrast on 09/30/2011 showed     progressive compression fractures at L2, L3 and T12.  There is     diffuse scattered osseous metastatic disease.  There was lumbar     spondylosis and degenerative disk disease causing various degrees     of impingement at all levels between L2 and S1.  Progressive     collapse of L3 was noted with 75% loss of vertebral body height,     whereas on the prior study of 07/04/2011 there was 20% loss of     height.  There was 6 mm of bony retropulsion.  There was     progressive loss of L2 vertebral body noted with 50% loss of height     whereas formerly it was at 20% loss.  There is a 50-60% loss of     vertebral body height noted at T12 minimally increased from the     prior study with 2 mm of bony retropulsion.  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  IMPRESSION AND PLAN:  Mr. Mathey continues to do well.  Hopefully he will derive some decrease in pain from the kyphoplasties that he underwent on 10/13/2011.  Will go ahead with the chemotherapy that has been scheduled today and scheduled weekly.  In fact, Mr. Doucet has continued with weekly treatments that were most recently given on 03/0, 03/08, 03/15 and 03/22.  I had thought that Jaleal was getting his Velcade subcu. Apparently he has been getting it IV.  We are making that change so that today he will receive Velcade 2.75 mg subcu, Cytoxan 300 mg IV, Decadron 20 mg IV.  Because of the multitude of appointments, the fact that he is doing so well, and the fact that he may be having some increase in neuropathy we are going to change treatments from every week to every 2 weeks.  The next scheduled chemotherapy will be on April 12th with a preceding CBC.  We will plan to see Skylan again on April 26 at which time we will check CBC, chemistries including a uric acid and urine protein to creatinine  ratio.  Ediel will be scheduled for chemotherapy at that time.  It will also give [place] a chance to update him and see where he is with regard to the treatments that are planned at [facility].  Initially he will need peripheral stem cell mobilization probably with high-dose Cytoxan and then ultimately high-dose melphalan with stem cell rescue.    ______________________________ Samul Dada, M.D. DSM/MEDQ  D:  10/23/2011  T:  10/23/2011  Job:  [id number]

## 2011-10-23 NOTE — Patient Instructions (Signed)
Cancer Center Discharge Instructions for Patients Receiving Chemotherapy  Today you received the following chemotherapy agents cytoxan/velcade  To help prevent nausea and vomiting after your treatment, we encourage you to take your nausea medication  and take it as often as prescribed   If you develop nausea and vomiting that is not controlled by your nausea medication, call the clinic. If it is after clinic hours your family physician or the after hours number for the clinic or go to the Emergency Department.   BELOW ARE SYMPTOMS THAT SHOULD BE REPORTED IMMEDIATELY:  *FEVER GREATER THAN 100.5 F  *CHILLS WITH OR WITHOUT FEVER  NAUSEA AND VOMITING THAT IS NOT CONTROLLED WITH YOUR NAUSEA MEDICATION  *UNUSUAL SHORTNESS OF BREATH  *UNUSUAL BRUISING OR BLEEDING  TENDERNESS IN MOUTH AND THROAT WITH OR WITHOUT PRESENCE OF ULCERS  *URINARY PROBLEMS  *BOWEL PROBLEMS  UNUSUAL RASH Items with * indicate a potential emergency and should be followed up as soon as possible.  One of the nurses will contact you 24 hours after your treatment. Please let the nurse know about any problems that you may have experienced. Feel free to call the clinic you have any questions or concerns. The clinic phone number is [phone].   I have been informed and understand all the instructions given to me. I know to contact the clinic, my physician, or go to the Emergency Department if any problems should occur. I do not have any questions at this time, but understand that I may call the clinic during office hours   should I have any questions or need assistance in obtaining follow up care.    __________________________________________  _____________  __________ Signature of Patient or Authorized Representative            Date                   Time    __________________________________________ Nurse's Signature    

## 2011-10-26 ENCOUNTER — Telehealth: Payer: Self-pay | Admitting: Medical Oncology

## 2011-10-26 NOTE — Telephone Encounter (Signed)
Pt called stating that his thighs and his shins are sensitive. He addressed his neuropathy in his feet with Dr. Arline Asp 10/23/11. He states that he got his velcade injection 10/23/11 and this week-end had some pain and fullness in his right lower quadrant where he got his velcade injection.  He has had issues with constipation and he did take medications for this and the pain in his right lower abdomen has gotten better.  He also states that his back pain is not much better following his kyphoplasty and he follow up tomorrow with Dr. Corliss [name]. Pt also states he has some tenderness in his testicles when he sits. I will discuss with Dr. Arline Asp and call him back.

## 2011-10-27 ENCOUNTER — Ambulatory Visit (HOSPITAL_COMMUNITY)
Admission: RE | Admit: 2011-10-27 | Discharge: 2011-10-27 | Disposition: A | Discharge status: Home / Self Care | Payer: Medicare Other | Source: Ambulatory Visit | Attending: Interventional Radiology | Admitting: Interventional Radiology

## 2011-10-27 DIAGNOSIS — IMO0002 Reserved for concepts with insufficient information to code with codable children: Secondary | ICD-10-CM

## 2011-10-28 ENCOUNTER — Encounter: Payer: Self-pay | Admitting: Oncology

## 2011-10-28 NOTE — Progress Notes (Signed)
Review of bone biopsy pathology from kyphoplasties done on 10/13/11 showed scattered plasma cells with no kappa or lambda light chain restriction.

## 2011-10-30 ENCOUNTER — Other Ambulatory Visit: Payer: Medicare Other

## 2011-10-30 ENCOUNTER — Ambulatory Visit: Payer: Medicare Other

## 2011-11-05 ENCOUNTER — Encounter: Payer: Self-pay | Admitting: Medical Oncology

## 2011-11-06 ENCOUNTER — Telehealth: Payer: Self-pay | Admitting: Medical Oncology

## 2011-11-06 ENCOUNTER — Telehealth: Payer: Self-pay | Admitting: Oncology

## 2011-11-06 ENCOUNTER — Other Ambulatory Visit: Payer: Medicare Other | Admitting: Lab

## 2011-11-06 ENCOUNTER — Ambulatory Visit: Payer: Medicare Other

## 2011-11-06 ENCOUNTER — Other Ambulatory Visit: Payer: Self-pay | Admitting: Medical Oncology

## 2011-11-06 NOTE — Telephone Encounter (Signed)
Pt called to let us know he will having his hickman placed at [facility] 11/11/11. They asked that we make him an appointment to have it checked on Thurs. They are aware that the schedulers will call them with appointment.

## 2011-11-06 NOTE — Telephone Encounter (Signed)
S/w pt re flush appt 4/18.

## 2011-11-10 ENCOUNTER — Other Ambulatory Visit: Payer: Self-pay | Admitting: Oncology

## 2011-11-11 ENCOUNTER — Telehealth: Payer: Self-pay | Admitting: Oncology

## 2011-11-11 NOTE — Telephone Encounter (Signed)
l/m with injk appts     aom

## 2011-11-12 ENCOUNTER — Telehealth: Payer: Self-pay | Admitting: Medical Oncology

## 2011-11-12 ENCOUNTER — Ambulatory Visit (HOSPITAL_BASED_OUTPATIENT_CLINIC_OR_DEPARTMENT_OTHER): Payer: Medicare Other

## 2011-11-12 DIAGNOSIS — C9000 Multiple myeloma not having achieved remission: Secondary | ICD-10-CM

## 2011-11-12 MED ORDER — FILGRASTIM 480 MCG/0.8ML IJ SOLN
960.0000 ug | Freq: Once | INTRAMUSCULAR | Status: AC
  Administered 2011-11-12: 960 ug via SUBCUTANEOUS
  Filled 2011-11-12: qty 1.6

## 2011-11-12 NOTE — Telephone Encounter (Signed)
I spoke with [name] to clarify Hickman flush. Pt had double lumen hickman placed at [facility] yesterday. Pt here today for dressing change and neupogen. On dressing there is a sticker that says" 5000 "units of heparin and do not flush. I called to clarify heparin flushes. She states they withdraw 10 ml of blood, flush with 10 ml of saline and then 250 units of heparin flush to each lumen. They do not use the 5000 units of heparin. I told her that  Is our policy also but just needed to clarify. Marylene Land 's Pager [phone]

## 2011-11-13 ENCOUNTER — Other Ambulatory Visit: Payer: Self-pay | Admitting: *Deleted

## 2011-11-13 ENCOUNTER — Ambulatory Visit (HOSPITAL_BASED_OUTPATIENT_CLINIC_OR_DEPARTMENT_OTHER): Payer: Medicare Other

## 2011-11-13 ENCOUNTER — Other Ambulatory Visit: Payer: Medicare Other

## 2011-11-13 ENCOUNTER — Ambulatory Visit: Payer: Medicare Other

## 2011-11-13 DIAGNOSIS — C9000 Multiple myeloma not having achieved remission: Secondary | ICD-10-CM

## 2011-11-13 MED ORDER — FILGRASTIM 480 MCG/0.8ML IJ SOLN
960.0000 ug | Freq: Once | INTRAMUSCULAR | Status: AC
  Administered 2011-11-13: 960 ug via SUBCUTANEOUS
  Filled 2011-11-13: qty 1.6

## 2011-11-13 MED ORDER — HEPARIN SOD (PORK) LOCK FLUSH 100 UNIT/ML IV SOLN
500.0000 [IU] | Freq: Once | INTRAVENOUS | Status: AC
  Administered 2011-11-13: 500 [IU] via INTRAVENOUS
  Filled 2011-11-13: qty 5

## 2011-11-13 MED ORDER — SODIUM CHLORIDE 0.9 % IJ SOLN
10.0000 mL | INTRAMUSCULAR | Status: DC | PRN
  Administered 2011-11-13: 10 mL via INTRAVENOUS
  Filled 2011-11-13: qty 10

## 2011-11-13 NOTE — Progress Notes (Signed)
Hickman dressing changes, old drainage noted at insertion site. withdrawn from each lumen, flushed with NS anf 250 units Heparin per protocol.

## 2011-11-14 ENCOUNTER — Ambulatory Visit (HOSPITAL_BASED_OUTPATIENT_CLINIC_OR_DEPARTMENT_OTHER): Payer: Medicare Other

## 2011-11-14 DIAGNOSIS — C9000 Multiple myeloma not having achieved remission: Secondary | ICD-10-CM

## 2011-11-14 MED ORDER — FILGRASTIM 480 MCG/0.8ML IJ SOLN
960.0000 ug | Freq: Once | INTRAMUSCULAR | Status: AC
  Administered 2011-11-14: 960 ug via SUBCUTANEOUS

## 2011-11-18 ENCOUNTER — Encounter: Payer: Self-pay | Admitting: Oncology

## 2011-11-18 NOTE — Progress Notes (Signed)
Put aflac forms on nurse's desk. °

## 2011-11-19 ENCOUNTER — Telehealth: Payer: Self-pay | Admitting: Medical Oncology

## 2011-11-19 ENCOUNTER — Encounter: Payer: Self-pay | Admitting: Oncology

## 2011-11-19 ENCOUNTER — Telehealth: Payer: Self-pay | Admitting: Oncology

## 2011-11-19 NOTE — Telephone Encounter (Signed)
Pt called and states that he needs to cancel his appointment tomorrow. He has already had his stem cells harvested. He is going to [facility] today for a MRI of his hip. He has developed terrible pain and they are not sure what his is from. He will be going back Tuesday to be admitted for his chemo then to receive his stem cells I will cancel his appointment for tomorrow. I asked the pt to keep [place] informed and he states that he will.Marland Kitchen

## 2011-11-19 NOTE — Progress Notes (Signed)
Faxed aflac forms to [phone]; put originals in the registration desk.

## 2011-11-19 NOTE — Telephone Encounter (Signed)
per robin cx the  4/26 appts as pt is as [facility] for a transplant.

## 2011-11-20 ENCOUNTER — Other Ambulatory Visit: Payer: Medicare Other | Admitting: Lab

## 2011-11-20 ENCOUNTER — Other Ambulatory Visit: Payer: Medicare Other

## 2011-11-20 ENCOUNTER — Ambulatory Visit: Payer: Medicare Other

## 2011-11-20 ENCOUNTER — Ambulatory Visit: Payer: Medicare Other | Admitting: Oncology

## 2011-11-27 ENCOUNTER — Other Ambulatory Visit: Payer: Medicare Other

## 2011-11-27 ENCOUNTER — Ambulatory Visit: Payer: Medicare Other

## 2011-11-30 ENCOUNTER — Telehealth: Payer: Self-pay

## 2011-11-30 NOTE — Telephone Encounter (Signed)
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 Pt is receiving 8 XRT tx to hip started 5/2. F/U appt w/Dr Barbaraann Boys is 5/23

## 2011-11-30 NOTE — Telephone Encounter (Signed)
lvm for pt notes from dr Ferol Luz concerning the plasma cytoma and radiation therapy that pt's girlfriend [name] informed us about

## 2011-12-01 ENCOUNTER — Telehealth: Payer: Self-pay

## 2011-12-01 NOTE — Telephone Encounter (Signed)
Rad onc consult note from [facility] received and forwarded to DSM. Dot Lanes RN said transplant is on hold until 2 weeks after pt completes XRT so approximately May 23rd. Krista's phone [phone]

## 2011-12-07 ENCOUNTER — Other Ambulatory Visit: Payer: Self-pay | Admitting: Nurse Practitioner

## 2011-12-07 ENCOUNTER — Encounter: Payer: Self-pay | Admitting: Oncology

## 2011-12-07 ENCOUNTER — Telehealth: Payer: Self-pay | Admitting: Oncology

## 2011-12-07 NOTE — Progress Notes (Signed)
We received copies of MRI reports carried out on this patient at [facility]. An MRI of the right hip on 11/19/2011 showed a destructive lesion of the right pubic body and adjacent rami with associated large incompletely imaged soft tissue component. There was also markedly heterogeneous abnormal marrow signal throughout the visualized marrow.  Also on 11/19/2011 patient underwent an MRI of the lumbar spine with and without IV contrast. There were multiple compression deformities noted, most severely L3 where there was slight retropulsion resulting in moderate canal stenosis.  An MRI of the right knee on 12/01/2011 because the patient was having pain showed no evidence of multiple myeloma. There was a horizontal medial meniscus tear involving the body and posterior horn. There was possible degenerative tearing/fraying of the medial aspect of the posterior horn lateral meniscus. There was mild to moderate severity tricarpartmentall cartilage loss.

## 2011-12-07 NOTE — Telephone Encounter (Signed)
l/m with all appts and will print for pt  at 5/15 appt

## 2011-12-08 NOTE — Telephone Encounter (Signed)
Pt called to let us know that he will be getting his hickman flushed at [facility] tomorrow and we need to cancel his appointment. Michelle notified.

## 2011-12-15 NOTE — Progress Notes (Signed)
Encounter addended by: Lowella Petties, RN on: 12/15/2011 10:23 AM<BR>     Documentation filed: Charges VN

## 2011-12-16 ENCOUNTER — Ambulatory Visit (HOSPITAL_BASED_OUTPATIENT_CLINIC_OR_DEPARTMENT_OTHER): Payer: Medicare Other

## 2011-12-16 DIAGNOSIS — Z452 Encounter for adjustment and management of vascular access device: Secondary | ICD-10-CM

## 2011-12-16 DIAGNOSIS — C9000 Multiple myeloma not having achieved remission: Secondary | ICD-10-CM

## 2011-12-16 MED ORDER — HEPARIN SOD (PORK) LOCK FLUSH 100 UNIT/ML IV SOLN
500.0000 [IU] | Freq: Once | INTRAVENOUS | Status: AC
  Administered 2011-12-16: 250 [IU] via INTRAVENOUS
  Filled 2011-12-16: qty 5

## 2011-12-16 MED ORDER — SODIUM CHLORIDE 0.9 % IJ SOLN
10.0000 mL | INTRAMUSCULAR | Status: DC | PRN
  Administered 2011-12-16: 10 mL via INTRAVENOUS
  Filled 2011-12-16: qty 10

## 2011-12-16 NOTE — Patient Instructions (Signed)
Call MD for problems 

## 2011-12-16 NOTE — Progress Notes (Signed)
12/16/11 CVC line double lumen- 10ml blood withdrawn, flushed with 10ml saline and heparin

## 2011-12-18 ENCOUNTER — Encounter: Payer: Self-pay | Admitting: Medical Oncology

## 2011-12-18 NOTE — Progress Notes (Signed)
Received a call from [facility] Transplant team to let us know they saw pt yesterday. They have ordered x-rays for pt's legs due to pain and if there are no fractures he will be admitted Wed for chemo. Dr. Arline Asp notified. They will fax office notes when ready.

## 2011-12-31 ENCOUNTER — Encounter: Payer: Self-pay | Admitting: Oncology

## 2011-12-31 NOTE — Progress Notes (Signed)
We received notes from [facility] regarding this patient's high-dose chemotherapy. His treatment was as follows:  Carmustine 600 mg IV on 12/23/2011. Cytarabine 400 mg IV on 5/30, 5/31, 6/1, and 12/27/2011. Etoposide 300 mg IV on 5/30, 5/31, 6/1 and 12/27/2011. Melphalan 280 mg IV on 12/28/2011.    The patient is on the following antimicrobial agents:  Acyclovir 400 mg twice a day. Fluconazole 400 mg daily starting 6/4. Cipro 500 mg twice daily starting 6/4.    The patient was scheduled to receive his autologous stem cell reinfusion on 12/29/2011.  The patient will be receiving daily Neupogen beginning on day +5.    Recommendations for transfusions are as follows:   Hemoglobin less than or equal to 9 g per deciliter; Platelets less than or equal to 10,000.

## 2012-01-07 ENCOUNTER — Telehealth: Payer: Self-pay

## 2012-01-07 NOTE — Telephone Encounter (Signed)
[name] called to let us know that [name] had stem cell transplant Tues 6/4. He was readmitted on 6/12 with fever that is down with antibiotics, weakness, vomiting and diarrhea, low blood counts. This is FYI.

## 2012-01-11 ENCOUNTER — Encounter: Payer: Self-pay | Admitting: Oncology

## 2012-01-11 NOTE — Progress Notes (Signed)
This patient received high-dose chemotherapy with BEAM followed by autologous stem cell reinfusion on 12/29/2011. He required intravenous fluids as an outpatient for nausea or vomiting following these treatments. On 01/06/2012 patient became febrile to 102 with shaking chills, nonstop diarrhea and weakness. He was lethargic and slightly hypotensive and was admitted to do from 01/06/2012 through 01/10/2012. Blood cultures were negative. The patient was treated empirically with ceftazidime and vancomycin. He improved and was discharged home on 01/10/2012 on the following medicines: Acyclovir 400 mg twice a day Fluconazole 400 mg daily Vancomycin 2 g IV every 24 hours, to be completed on 01/15/2012.  Ciprofloxacin was discontinued.  The plan was for the patient to continue with daily BMT clinic visits for labs/assessments and IV vancomycin through 01/15/2012.

## 2012-01-13 ENCOUNTER — Encounter: Payer: Self-pay | Admitting: *Deleted

## 2012-01-14 ENCOUNTER — Encounter: Payer: Self-pay | Admitting: Oncology

## 2012-01-14 NOTE — Progress Notes (Signed)
It was stated in an earlier documentation note that the blood cultures were negative. Apparently one of 2 blood cultures was positive for gram-positive cocci resembling Staphylococcus. The plan is for vancomycin to be continued for a 10 day course, to end on 01/15/2012.

## 2012-01-15 ENCOUNTER — Telehealth: Payer: Self-pay | Admitting: Oncology

## 2012-01-15 ENCOUNTER — Other Ambulatory Visit: Payer: Self-pay

## 2012-01-15 ENCOUNTER — Encounter: Payer: Self-pay | Admitting: Oncology

## 2012-01-15 DIAGNOSIS — N08 Glomerular disorders in diseases classified elsewhere: Secondary | ICD-10-CM

## 2012-01-15 DIAGNOSIS — E878 Other disorders of electrolyte and fluid balance, not elsewhere classified: Secondary | ICD-10-CM

## 2012-01-15 DIAGNOSIS — Z9481 Bone marrow transplant status: Secondary | ICD-10-CM

## 2012-01-15 NOTE — Telephone Encounter (Signed)
Talked to [place] PA from [facility] requesting an appt within a week of D/C which is today. Orders are being faxed to nurse

## 2012-01-15 NOTE — Progress Notes (Signed)
The patient was discharged from daily BMT clinic on 01/15/2012, Friday. His Hickman catheter was removed. He is seeing me on 01/22/2012. He has an appointment at [facility] on 02/17/2012.  Today the white count was 2.8 with the patient off Neupogen. Platelets were 44,000.  The patient is to be transfused with red cells for hemoglobin less than or equal to 9 and with platelets for platelet count less than or equal to 10,000.

## 2012-01-15 NOTE — Telephone Encounter (Signed)
called pts home s/w penny and scheduled appts for 06/28-07/19

## 2012-01-21 ENCOUNTER — Telehealth: Payer: Self-pay

## 2012-01-21 ENCOUNTER — Other Ambulatory Visit: Payer: Self-pay

## 2012-01-21 DIAGNOSIS — E669 Obesity, unspecified: Secondary | ICD-10-CM

## 2012-01-21 DIAGNOSIS — E781 Pure hyperglyceridemia: Secondary | ICD-10-CM

## 2012-01-21 NOTE — Telephone Encounter (Signed)
[name] called requesting cholesterol labs, discussed w/DSM and added liver profile to labs. LVM w/Penny that pt should be fasting for this test, take am meds and OK for black coffee.

## 2012-01-22 ENCOUNTER — Encounter: Payer: Self-pay | Admitting: Oncology

## 2012-01-22 ENCOUNTER — Other Ambulatory Visit (HOSPITAL_BASED_OUTPATIENT_CLINIC_OR_DEPARTMENT_OTHER): Payer: Medicare Other | Admitting: Lab

## 2012-01-22 ENCOUNTER — Telehealth: Payer: Self-pay

## 2012-01-22 ENCOUNTER — Other Ambulatory Visit: Payer: Self-pay

## 2012-01-22 ENCOUNTER — Telehealth: Payer: Self-pay | Admitting: Oncology

## 2012-01-22 ENCOUNTER — Ambulatory Visit (HOSPITAL_BASED_OUTPATIENT_CLINIC_OR_DEPARTMENT_OTHER): Payer: Medicare Other | Admitting: Oncology

## 2012-01-22 ENCOUNTER — Other Ambulatory Visit: Payer: Self-pay | Admitting: Oncology

## 2012-01-22 VITALS — BP 117/73 | HR 107 | Temp 97.2°F | Ht 72.0 in | Wt 165.7 lb

## 2012-01-22 DIAGNOSIS — C9000 Multiple myeloma not having achieved remission: Secondary | ICD-10-CM

## 2012-01-22 DIAGNOSIS — E669 Obesity, unspecified: Secondary | ICD-10-CM

## 2012-01-22 DIAGNOSIS — R634 Abnormal weight loss: Secondary | ICD-10-CM

## 2012-01-22 DIAGNOSIS — Z9481 Bone marrow transplant status: Secondary | ICD-10-CM

## 2012-01-22 DIAGNOSIS — E781 Pure hyperglyceridemia: Secondary | ICD-10-CM

## 2012-01-22 DIAGNOSIS — E878 Other disorders of electrolyte and fluid balance, not elsewhere classified: Secondary | ICD-10-CM

## 2012-01-22 LAB — CMP AND LIVER
AST: 20 U/L (ref 0–37)
Albumin: 3.3 g/dL — ABNORMAL LOW (ref 3.5–5.2)
BUN: 10 mg/dL (ref 6–23)
Calcium: 8.5 mg/dL (ref 8.4–10.5)
Chloride: 103 mEq/L (ref 96–112)
Creatinine, Ser: 0.98 mg/dL (ref 0.50–1.35)
Glucose, Bld: 89 mg/dL (ref 70–99)
Indirect Bilirubin: 0.4 mg/dL (ref 0.0–0.9)

## 2012-01-22 LAB — CBC WITH DIFFERENTIAL/PLATELET
Basophils Absolute: 0 10*3/uL (ref 0.0–0.1)
Eosinophils Absolute: 0 10*3/uL (ref 0.0–0.5)
HGB: 11.3 g/dL — ABNORMAL LOW (ref 13.0–17.1)
MCV: 97 fL (ref 79.3–98.0)
MONO#: 0.4 10*3/uL (ref 0.1–0.9)
MONO%: 19 % — ABNORMAL HIGH (ref 0.0–14.0)
NEUT#: 0.5 10*3/uL — ABNORMAL LOW (ref 1.5–6.5)
RDW: 15.7 % — ABNORMAL HIGH (ref 11.0–14.6)
WBC: 2.2 10*3/uL — ABNORMAL LOW (ref 4.0–10.3)

## 2012-01-22 MED ORDER — FILGRASTIM 300 MCG/0.5ML IJ SOLN: 300.0000 ug | Freq: Once | INTRAMUSCULAR | Status: DC

## 2012-01-22 NOTE — Progress Notes (Signed)
CC:   [name], MD [name], M.D. [name], MD [name], M.D. [name], M.D. [name], MD  PROBLEM LIST:  1. Kappa light chain multiple myeloma, ISS III with diagnosis  established in December 2012 when the patient presented with marked  anemia, thrombocytopenia and extensive bone marrow replacement with  immature plasma cells/plasma blasts and circulating plasma cells in  the peripheral blood. Bone marrow was carried out on 07/08/2011.  In addition, the patient had severe renal insufficiency and  hypercalcemia. He was requiring transfusions of platelets and red  cells. He appeared to have limited bone disease characterized by  mild compression fractures of T12 and L3. A bone density scan done  a couple months preceding the diagnosis had been normal. Results  of cytogenetic analysis revealed the presence of 2 clonal cell  lines. The 1st cell line was chromosomally normal (20% of cells).  The 2nd cell line was cytogenetically abnormal and was hypodiploid  in nature with loss of chromosomes 4, 13, 14, 16, 17, 20 and 22.  There was a gain of chromosome 7 and a marker chromosome.  Chromosome 2 appeared to be in a rearrangement involving both its  short and long arms. FISH studies showed loss of chromosome 4, loss  of chromosome 14, loss of chromosome 13, and loss of chromosome 17,  i.e. 4 P-, 14 Q-, 13 Q- and 17 P-. These findings would be  consistent with abnormalities associated with multiple myeloma.  The patient received Velcade, Cytoxan, and Decadron on a weekly basis from 07/10/2011 through 10/23/2011.    Care since early April 2013 has been carried out at [facility]. The patient developed pain in the region of his right hip during the month of April.  An MRI of the right hip on 11/19/2011 showed a destructive lesion of the right pubic body and adjacent rami with associated soft tissue component.  There was marked  heterogeneous abnormal marrow signal throughout the visualized marrow.  The patient also underwent an MRI of the lumbar spine with and without IV contrast on 11/19/2011.  Multiple compression deformities were noted, most severely at L3, where there was slight retropulsion resulting in moderate canal stenosis.  An MRI of the right knee on 12/01/2011 showed no evidence of multiple myeloma; however, there was a horizontal medial meniscus tear involving the body and posterior horn and degenerative changes.  These studies were carried out at [facility].  The patient underwent radiation treatments to the right hemipelvis.  He received apparently 10 treatments with some improvement in pain.  Mr. Siefker then underwent high-dose chemotherapy on 11/23/2011, consisting of carmustine 600 mg IV.  He received cytarabine 400 mg IV on 12/24/2011, 12/25/2011, 12/26/2011, and 12/27/2011.  He received etoposide 300 mg IV daily from 12/24/2011 through 12/27/2011, four doses.  He received melphalan 280 mg IV on 12/28/2011.  He was scheduled to receive autologous stem cell reinfusion on 12/29/2011.  The patient's initial hospitalization was from 12/23/2011 through 12/28/2011.  He required readmission at [facility] from 01/06/2012 through 01/10/2012.  He had developed fever, shaking chills, diarrhea, weakness, and hypotension. One out of 2 blood cultures grew out gram-positive cocci, felt to be staphylococcus from his Hickman catheter.  He was treated with ceftazidime and vancomycin.  Vancomycin was discontinued after a 10-day course on 01/15/2012.  Instructions at this time are to transfuse with red cells for a hemoglobin less than or equal to 9 and with platelet transfusions for platelet count less than or  equal to 10,000 or if the patient is bleeding.  2. Pancytopenia at diagnosis secondary to extensive marrow replacement  by multiple myeloma.  3. Renal insufficiency, markedly improved since diagnosis.  4.  Hypercalcemia at presentation, now resolved following treatment.  5. Anemia which developed in mid January 2012 secondary to treatment  responsive to Aranesp.  6. History of prostate cancer dating back to September 2003 treated  with robotic assisted laparoscopic radical prostatectomy by Dr.  [name]. The tumor was pathologic T3a, involving both lobes  of the prostate with capsular extension and positive margin  focally. The patient did not receive postoperative radiation.  7. Hypertension.  8. Dyslipidemia.  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  10.Right-sided Port-A-Cath placement on 08/05/2011.  11.Kyphoplasty carried out by Dr. [name] on 10/13/2011  involving vertebral bodies T12, L2 and L3. Review of bone biopsy pathology from kyphoplasties done on 10/13/2011 showed scattered plasma cells with no kappa or lambda light chain restriction. 12. Destructive lesion of the right pubic body and adjacent rami with associated soft tissue component detected on MRI of the right hip on 11/19/2011 at [facility].  The patient received 10 radiation treatments at [facility] with improvement in pain. 13.Weight loss of approximately 35 pounds from May through June 2013.   MEDICATIONS: 1. Acyclovir 400 mg twice daily. 2. Allopurinol 100 mg daily. 3. Flovent inhaled 2 puffs twice daily. 4. Neurontin 300 mg daily at present.  The patient is planning to     discontinue this medicine since it does not seem to be helping with     his neuropathy. 5. Ativan 1 mg every 4 hours as needed. 6. OxyIR 5 mg every 6 hours as needed. 7. OxyContin 10 mg every 12 hours as needed. 8. Protonix 40 mg daily. 9. K-Dur 20 mEq daily. 10.Compazine 10 mg every 6 hours as needed for nausea and vomiting. 11.Actigall 300 mg twice daily, to be discontinued within the next     couple of weeks. 12.Ambien 5 mg as needed.  HISTORY:  I saw Corneilus Heggie today for the 1st  time since his last appointment here on 10/23/2011.  He was accompanied by Romeo Rabon. Mr. Katich has had quite a rough time of it over the past couple of months as outlined above.  He had been doing well on 03/29.  However, in April he developed pain in the region of his right hip and was found to have a destructive lesion in the right pubic body, requiring radiation treatments.  He is still having some discomfort, especially when he does weightbearing and when he flexes his hip.  He is getting around with a walker.  Denies any falls.  He is really not taking any pain medicine nor is he having any problems with sleeping.  The patient was having nausea and vomiting up until earlier this week.  Appetite is depressed and his taste for food is altered.  He has lost approximately 35 pounds over the past few months.  He denies any diarrhea or constipation.  He has not had any significant fever or chills, although he did have a staph infection related to his Hickman catheter back a few weeks ago as described above.  The patient has had some low-grade fever.  He has some postnasal drip with some cough, but denies any respiratory problems, shortness of breath, chills, or high spiking fever.  No bleeding or bruising problems.  He was having some swelling of his  legs, but that seems to have resolved.  The patient was discharged from [facility] day supervision on Friday, June 21st and he has been back in [place] since then.  He did have some red cell transfusions within the past couple of weeks, but apparently no platelet transfusions.  He is no longer on Neupogen.  PHYSICAL EXAMINATION:  Vital Signs:  The patient has had a major weight loss of about 35 pounds, which is quite evident.  Weight is 165.7 pounds, as compared with 200 pounds back on 03/29.  Height 6 feet even, body surface area 1.95 sq m.  Blood pressure 117/73.  Other vital signs are normal except for a pulse of 107.  Pulse is  regular.  Temperature 97.2.  HEENT:  There is no scleral icterus.  Mouth and pharynx are benign.  No peripheral adenopathy palpable.  Heart/Lungs:  Normal. Back:  No skeletal tenderness.  No tenderness to palpation around the hip area, although there is pain on flexing the hip and weightbearing. Pain seems to be under relatively good control.  Abdomen:  Benign with no organomegaly or masses palpable.  There is no Hickman catheter or port.  Extremities:  No peripheral edema, clubbing, petechiae, or purpura.  Skin:  Dry and flaking.  Neurologic Exam:  Grossly normal.  LABORATORY DATA:  Today white count 2.2, ANC 0.5, hemoglobin 11.3, hematocrit 33.5, platelets were 68,000.  On 12/27/2011 at [facility], the white count was 2.8 with the patient off of Neupogen.  Platelets were 44,000.  We do not have an ANC from that date.  That apparently were his last labs.  Chemistries, magnesium, and lipid studies are pending.  From 10/23/2011, chemistries were normal except for glucose of 132 and a total protein of 5.5.  Uric acid was 6.1, albumin 3.6, and LDH 123 on 10/23/2011.  IMAGING STUDIES:  1. Two-view chest x-ray on 06/28/2011 shows some mild bronchitic  changes.  2. Metastatic bone survey on 07/04/2011 showed no lytic lesions in  the visualized axial or appendicular skeleton. There was mild  superior end-plate compression deformities at T12 and L3.  3. CT-guided left iliac bone marrow aspirate and core biopsy was  carried out on 07/08/2011.  4. MRI of the lumbar spine without IV contrast on 09/30/2011 showed  progressive compression fractures at L2, L3 and T12. There is  diffuse scattered osseous metastatic disease. There was lumbar  spondylosis and degenerative disk disease causing various degrees  of impingement at all levels between L2 and S1. Progressive  collapse of L3 was noted with 75% loss of vertebral body height,  whereas on the prior study of 07/04/2011 there was 20% loss of  height.  There was 6 mm of bony retropulsion. There was  progressive loss of L2 vertebral body noted with 50% loss of height  whereas formerly it was at 20% loss. There is a 50-60% loss of  vertebral body height noted at T12 minimally increased from the  prior study with 2 mm of bony retropulsion. 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   IMPRESSION AND PLAN:  Clearly Mr. Batrez has had a rough time of it, but seems to be on the mend.  We focused a lot on his weight loss and I stressed nutritional supplementation.  The patient knows to call us should there be any significant change in his condition and we reviewed all of the changes which would warrant an immediate phone call.  I think the patient and  [name] understand these issues quite well.  Starting on July 3rd and weekly, we will be checking CBC and diff, CMET, LDH, and magnesium.  Mr. Budai has an appointment to be seen at [facility] by Dr. Barbaraann Boys on July 24th.  We will plan to see the patient again around August 1st, at which time we will check CBC and chemistries.  At some point, the patient will probably be a candidate for maintenance therapy with Revlimid.  Today's session was rather extended, well over an hour.  [name] had some questions about prognosis and we talked about them in general terms. The patient and [name] understand that the patient has very high-risk cytogenetics with many mutations and other adverse factors, such as his extensive marrow involvement and renal insufficiency at the time of presentation.    ______________________________ Samul Dada, M.D. DSM/MEDQ  D:  01/22/2012  T:  01/22/2012  Job:  [id number]

## 2012-01-22 NOTE — Telephone Encounter (Signed)
called pt with 7/1 appts.they were already aware  aom  aom

## 2012-01-22 NOTE — Progress Notes (Signed)
This office note has been dictated.  #[id number]

## 2012-01-22 NOTE — Telephone Encounter (Signed)
Gave pt appt for July 2013 labs and see MD in August 2013 with labs

## 2012-01-22 NOTE — Telephone Encounter (Signed)
Labs faxed to [facility] w/Dr Barbaraann Boys, DSM is requesting instructions on neupogen/neulasta.

## 2012-01-23 ENCOUNTER — Ambulatory Visit (HOSPITAL_BASED_OUTPATIENT_CLINIC_OR_DEPARTMENT_OTHER): Payer: Medicare Other

## 2012-01-23 VITALS — BP 112/74 | HR 106 | Temp 97.4°F

## 2012-01-23 DIAGNOSIS — D709 Neutropenia, unspecified: Secondary | ICD-10-CM

## 2012-01-23 DIAGNOSIS — C9000 Multiple myeloma not having achieved remission: Secondary | ICD-10-CM

## 2012-01-23 MED ORDER — FILGRASTIM 480 MCG/0.8ML IJ SOLN
300.0000 ug | Freq: Once | INTRAMUSCULAR | Status: AC
  Administered 2012-01-23: 300 ug via SUBCUTANEOUS

## 2012-01-25 ENCOUNTER — Telehealth: Payer: Self-pay

## 2012-01-25 ENCOUNTER — Other Ambulatory Visit: Payer: Medicare Other

## 2012-01-25 ENCOUNTER — Encounter: Payer: Self-pay | Admitting: Oncology

## 2012-01-25 ENCOUNTER — Other Ambulatory Visit: Payer: Self-pay

## 2012-01-25 DIAGNOSIS — C9000 Multiple myeloma not having achieved remission: Secondary | ICD-10-CM

## 2012-01-25 LAB — COMPREHENSIVE METABOLIC PANEL
AST: 22 U/L (ref 0–37)
Albumin: 3.2 g/dL — ABNORMAL LOW (ref 3.5–5.2)
Alkaline Phosphatase: 67 U/L (ref 39–117)
BUN: 11 mg/dL (ref 6–23)
Creatinine, Ser: 0.91 mg/dL (ref 0.50–1.35)
Glucose, Bld: 101 mg/dL — ABNORMAL HIGH (ref 70–99)
Potassium: 4.2 mEq/L (ref 3.5–5.3)

## 2012-01-25 LAB — CBC WITH DIFFERENTIAL/PLATELET
Basophils Absolute: 0 10*3/uL (ref 0.0–0.1)
EOS%: 0.7 % (ref 0.0–7.0)
Eosinophils Absolute: 0.1 10*3/uL (ref 0.0–0.5)
HCT: 32.5 % — ABNORMAL LOW (ref 38.4–49.9)
HGB: 11 g/dL — ABNORMAL LOW (ref 13.0–17.1)
LYMPH%: 16.4 % (ref 14.0–49.0)
MCH: 33.1 pg (ref 27.2–33.4)
MCV: 98 fL (ref 79.3–98.0)
MONO%: 11.5 % (ref 0.0–14.0)
NEUT#: 6.3 10*3/uL (ref 1.5–6.5)
NEUT%: 70.9 % (ref 39.0–75.0)
Platelets: 67 10*3/uL — ABNORMAL LOW (ref 140–400)
RDW: 16.5 % — ABNORMAL HIGH (ref 11.0–14.6)

## 2012-01-25 LAB — MAGNESIUM: Magnesium: 1.4 mg/dL — ABNORMAL LOW (ref 1.5–2.5)

## 2012-01-25 MED ORDER — MAGNESIUM OXIDE 400 (241.3 MG) MG PO TABS: 400.0000 mg | ORAL_TABLET | Freq: Every day | ORAL | Status: DC

## 2012-01-25 NOTE — Telephone Encounter (Signed)
Pt asked that we send records from [facility] to Dr Briant Cedar and the PA [place] at [facility]. I relayed this message to Dr Dietrich Pates business office

## 2012-01-25 NOTE — Telephone Encounter (Signed)
Labs faxed to [facility] at [place]. [phone]

## 2012-01-25 NOTE — Telephone Encounter (Signed)
S/w pt that DSM wants him to take magnesium oxide 400 mg daily. Sent Rx to pharmacy but this is an OTC med.

## 2012-01-25 NOTE — Progress Notes (Signed)
At the recommendation of [facility], the patient received Neupogen 300 mcg subcutaneous on Saturday, 01/23/2012.  Today the white count was 8.9. ANC 6.3 as compared with 2.2 and 0.5 respectively on 01/22/2012.  Patient is scheduled for weekly CBCs beginning July 3.

## 2012-01-26 NOTE — Telephone Encounter (Signed)
Opened by error.

## 2012-01-27 ENCOUNTER — Telehealth: Payer: Self-pay

## 2012-01-27 ENCOUNTER — Other Ambulatory Visit (HOSPITAL_BASED_OUTPATIENT_CLINIC_OR_DEPARTMENT_OTHER): Payer: Medicare Other | Admitting: Lab

## 2012-01-27 DIAGNOSIS — C9000 Multiple myeloma not having achieved remission: Secondary | ICD-10-CM

## 2012-01-27 DIAGNOSIS — Z9481 Bone marrow transplant status: Secondary | ICD-10-CM

## 2012-01-27 DIAGNOSIS — E878 Other disorders of electrolyte and fluid balance, not elsewhere classified: Secondary | ICD-10-CM

## 2012-01-27 LAB — COMPREHENSIVE METABOLIC PANEL
ALT: 14 U/L (ref 0–53)
AST: 19 U/L (ref 0–37)
CO2: 21 mEq/L (ref 19–32)
Creatinine, Ser: 0.96 mg/dL (ref 0.50–1.35)
Total Bilirubin: 0.4 mg/dL (ref 0.3–1.2)

## 2012-01-27 LAB — CBC WITH DIFFERENTIAL/PLATELET
BASO%: 0.5 % (ref 0.0–2.0)
EOS%: 0.9 % (ref 0.0–7.0)
HCT: 33.2 % — ABNORMAL LOW (ref 38.4–49.9)
LYMPH%: 22 % (ref 14.0–49.0)
MCH: 33 pg (ref 27.2–33.4)
MCHC: 33.6 g/dL (ref 32.0–36.0)
MCV: 98.1 fL — ABNORMAL HIGH (ref 79.3–98.0)
NEUT%: 57.5 % (ref 39.0–75.0)
Platelets: 104 10*3/uL — ABNORMAL LOW (ref 140–400)

## 2012-01-27 LAB — MAGNESIUM: Magnesium: 1.4 mg/dL — ABNORMAL LOW (ref 1.5–2.5)

## 2012-01-27 NOTE — Telephone Encounter (Signed)
Faxed labs from 01/27/12 to Peter Minium PA

## 2012-01-29 ENCOUNTER — Other Ambulatory Visit: Payer: Medicare Other | Admitting: Lab

## 2012-02-01 ENCOUNTER — Telehealth: Payer: Self-pay | Admitting: Nurse Practitioner

## 2012-02-01 NOTE — Telephone Encounter (Signed)
Spoke with patient.  Ensured he started Magnesium last week.  He verified that he had.

## 2012-02-03 ENCOUNTER — Other Ambulatory Visit (HOSPITAL_BASED_OUTPATIENT_CLINIC_OR_DEPARTMENT_OTHER): Payer: Medicare Other | Admitting: Lab

## 2012-02-03 DIAGNOSIS — C9000 Multiple myeloma not having achieved remission: Secondary | ICD-10-CM

## 2012-02-03 LAB — MAGNESIUM: Magnesium: 1.5 mg/dL (ref 1.5–2.5)

## 2012-02-03 LAB — LACTATE DEHYDROGENASE: LDH: 126 U/L (ref 94–250)

## 2012-02-03 LAB — COMPREHENSIVE METABOLIC PANEL
AST: 13 U/L (ref 0–37)
BUN: 15 mg/dL (ref 6–23)
CO2: 26 mEq/L (ref 19–32)
Calcium: 9 mg/dL (ref 8.4–10.5)
Chloride: 103 mEq/L (ref 96–112)
Creatinine, Ser: 1.12 mg/dL (ref 0.50–1.35)
Glucose, Bld: 88 mg/dL (ref 70–99)

## 2012-02-03 LAB — CBC WITH DIFFERENTIAL/PLATELET
Basophils Absolute: 0.1 10*3/uL (ref 0.0–0.1)
EOS%: 3.4 % (ref 0.0–7.0)
Eosinophils Absolute: 0.2 10*3/uL (ref 0.0–0.5)
HCT: 33.5 % — ABNORMAL LOW (ref 38.4–49.9)
HGB: 11.4 g/dL — ABNORMAL LOW (ref 13.0–17.1)
MCH: 33.8 pg — ABNORMAL HIGH (ref 27.2–33.4)
NEUT#: 4.3 10*3/uL (ref 1.5–6.5)
NEUT%: 62.6 % (ref 39.0–75.0)
lymph#: 1.4 10*3/uL (ref 0.9–3.3)

## 2012-02-04 ENCOUNTER — Other Ambulatory Visit: Payer: Self-pay | Admitting: Nurse Practitioner

## 2012-02-04 MED ORDER — ACYCLOVIR 800 MG PO TABS: 800.0000 mg | ORAL_TABLET | Freq: Two times a day (BID) | ORAL | Status: DC

## 2012-02-05 ENCOUNTER — Other Ambulatory Visit: Payer: Medicare Other | Admitting: Lab

## 2012-02-10 ENCOUNTER — Other Ambulatory Visit (HOSPITAL_BASED_OUTPATIENT_CLINIC_OR_DEPARTMENT_OTHER): Payer: Medicare Other | Admitting: Lab

## 2012-02-10 DIAGNOSIS — C9000 Multiple myeloma not having achieved remission: Secondary | ICD-10-CM

## 2012-02-10 LAB — CBC WITH DIFFERENTIAL/PLATELET
BASO%: 1.1 % (ref 0.0–2.0)
Eosinophils Absolute: 0.2 10*3/uL (ref 0.0–0.5)
MCHC: 34 g/dL (ref 32.0–36.0)
MONO#: 0.7 10*3/uL (ref 0.1–0.9)
NEUT#: 3.5 10*3/uL (ref 1.5–6.5)
RBC: 3.09 10*6/uL — ABNORMAL LOW (ref 4.20–5.82)
RDW: 17.3 % — ABNORMAL HIGH (ref 11.0–14.6)
WBC: 5.4 10*3/uL (ref 4.0–10.3)
lymph#: 1 10*3/uL (ref 0.9–3.3)

## 2012-02-10 LAB — COMPREHENSIVE METABOLIC PANEL
ALT: 11 U/L (ref 0–53)
Albumin: 3.5 g/dL (ref 3.5–5.2)
CO2: 27 mEq/L (ref 19–32)
Glucose, Bld: 92 mg/dL (ref 70–99)
Potassium: 3.8 mEq/L (ref 3.5–5.3)
Sodium: 139 mEq/L (ref 135–145)
Total Protein: 6.1 g/dL (ref 6.0–8.3)

## 2012-02-12 ENCOUNTER — Other Ambulatory Visit: Payer: Medicare Other | Admitting: Lab

## 2012-02-25 ENCOUNTER — Telehealth: Payer: Self-pay | Admitting: Oncology

## 2012-02-25 ENCOUNTER — Encounter: Payer: Self-pay | Admitting: Medical Oncology

## 2012-02-25 ENCOUNTER — Other Ambulatory Visit (HOSPITAL_BASED_OUTPATIENT_CLINIC_OR_DEPARTMENT_OTHER): Payer: Medicare Other | Admitting: Lab

## 2012-02-25 ENCOUNTER — Encounter: Payer: Self-pay | Admitting: Oncology

## 2012-02-25 ENCOUNTER — Ambulatory Visit (HOSPITAL_BASED_OUTPATIENT_CLINIC_OR_DEPARTMENT_OTHER): Payer: Medicare Other | Admitting: Oncology

## 2012-02-25 VITALS — BP 128/79 | HR 95 | Temp 97.4°F | Ht 72.0 in | Wt 170.8 lb

## 2012-02-25 DIAGNOSIS — C9000 Multiple myeloma not having achieved remission: Secondary | ICD-10-CM

## 2012-02-25 DIAGNOSIS — Z9481 Bone marrow transplant status: Secondary | ICD-10-CM

## 2012-02-25 DIAGNOSIS — E878 Other disorders of electrolyte and fluid balance, not elsewhere classified: Secondary | ICD-10-CM

## 2012-02-25 LAB — CBC WITH DIFFERENTIAL/PLATELET
BASO%: 1 % (ref 0.0–2.0)
EOS%: 6.6 % (ref 0.0–7.0)
HCT: 31.5 % — ABNORMAL LOW (ref 38.4–49.9)
LYMPH%: 20 % (ref 14.0–49.0)
MCH: 34 pg — ABNORMAL HIGH (ref 27.2–33.4)
MCHC: 33 g/dL (ref 32.0–36.0)
MCV: 103 fL — ABNORMAL HIGH (ref 79.3–98.0)
MONO#: 0.7 10*3/uL (ref 0.1–0.9)
MONO%: 12.2 % (ref 0.0–14.0)
NEUT%: 60.2 % (ref 39.0–75.0)
Platelets: 142 10*3/uL (ref 140–400)
RBC: 3.05 10*6/uL — ABNORMAL LOW (ref 4.20–5.82)
WBC: 6 10*3/uL (ref 4.0–10.3)

## 2012-02-25 LAB — COMPREHENSIVE METABOLIC PANEL
ALT: 13 U/L (ref 0–53)
AST: 18 U/L (ref 0–37)
Alkaline Phosphatase: 78 U/L (ref 39–117)
Creatinine, Ser: 1.1 mg/dL (ref 0.50–1.35)
Sodium: 136 mEq/L (ref 135–145)
Total Bilirubin: 0.4 mg/dL (ref 0.3–1.2)
Total Protein: 7.4 g/dL (ref 6.0–8.3)

## 2012-02-25 LAB — LACTATE DEHYDROGENASE: LDH: 153 U/L (ref 94–250)

## 2012-02-25 NOTE — Telephone Encounter (Signed)
Gave pt appt for 03/23/12 lab and MD

## 2012-02-25 NOTE — Progress Notes (Signed)
This office note has been dictated.  #[id number]

## 2012-02-25 NOTE — Progress Notes (Signed)
CC:   Jeremiah Nora, MD [name], M.D. Jeremiah Purpura, MD [name], M.D. [name], M.D. Jeremiah Candle, MD  PROBLEM LIST:  1. Kappa light chain multiple myeloma, ISS III with diagnosis  established in December 2012 when the patient presented with marked  anemia, thrombocytopenia and extensive bone marrow replacement with  immature plasma cells/plasma blasts and circulating plasma cells in  the peripheral blood. Bone marrow was carried out on 07/08/2011.  In addition, the patient had severe renal insufficiency and  hypercalcemia. He was requiring transfusions of platelets and red  cells. He appeared to have limited bone disease characterized by  mild compression fractures of T12 and L3. A bone density scan done  a couple months preceding the diagnosis had been normal. Results  of cytogenetic analysis revealed the presence of 2 clonal cell  lines. The 1st cell line was chromosomally normal (20% of cells).  The 2nd cell line was cytogenetically abnormal and was hypodiploid  in nature with loss of chromosomes 4, 13, 14, 16, 17, 20 and 22.  There was a gain of chromosome 7 and a marker chromosome.  Chromosome 2 appeared to be in a rearrangement involving both its  short and long arms. FISH studies showed loss of chromosome 4, loss  of chromosome 14, loss of chromosome 13, and loss of chromosome 17,  i.e. 4 P-, 14 Q-, 13 Q- and 17 P-. These findings would be  consistent with abnormalities associated with multiple myeloma.  The patient received Velcade, Cytoxan, and Decadron on a weekly basis from 07/10/2011 through 10/23/2011.  Care since early April 2013 has been carried out at [facility].  The patient developed pain in the region of his right hip during the  month of April. An MRI of the right hip on 11/19/2011 showed a  destructive lesion of the right pubic body and adjacent rami with  associated soft tissue component. There was marked heterogeneous    abnormal marrow signal throughout the visualized marrow. The patient  also underwent an MRI of the lumbar spine with and without IV contrast  on 11/19/2011. Multiple compression deformities were noted, most  severely at L3, where there was slight retropulsion resulting in  moderate canal stenosis. An MRI of the right knee on 12/01/2011 showed  no evidence of multiple myeloma; however, there was a horizontal medial  meniscus tear involving the body and posterior horn and degenerative  changes. These studies were carried out at [facility]. The patient underwent  radiation treatments to the right hemipelvis. He received apparently 10  treatments with some improvement in pain.  [name] then underwent high-dose chemotherapy on 11/23/2011,  consisting of carmustine 600 mg IV. He received cytarabine 400 mg IV on  12/24/2011, 12/25/2011, 12/26/2011, and 12/27/2011. He received  etoposide 300 mg IV daily from 12/24/2011 through 12/27/2011, four  doses. He received melphalan 280 mg IV on 12/28/2011. He was scheduled  to receive autologous stem cell reinfusion on 12/29/2011. The patient's  initial hospitalization was from 12/23/2011 through 12/28/2011. He  required readmission at [facility] from 01/06/2012 through 01/10/2012. He had  developed fever, shaking chills, diarrhea, weakness, and hypotension.  One out of 2 blood cultures grew out gram-positive cocci, felt to be  staphylococcus from his Hickman catheter. He was treated with  ceftazidime and vancomycin. Vancomycin was discontinued after a 10-day  course on 01/15/2012. Instructions at this time are to transfuse with  red cells for a hemoglobin less than or equal to 9 and with  platelet  transfusions for platelet count less than or equal to 10,000 or if the  patient is bleeding.  2. Pancytopenia at diagnosis secondary to extensive marrow replacement  by multiple myeloma.  3. Renal insufficiency, markedly improved since diagnosis.  4. Hypercalcemia  at presentation, now resolved following treatment.  5. Anemia which developed in mid January 2012 secondary to treatment  responsive to Aranesp.  6. History of prostate cancer dating back to September 2003 treated  with robotic assisted laparoscopic radical prostatectomy by Dr.  Heloise Boyer. The tumor was pathologic T3a, involving both lobes  of the prostate with capsular extension and positive margin  focally. The patient did not receive postoperative radiation.  7. Hypertension.  8. Dyslipidemia.  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  10.Right-sided Port-A-Cath placement on 08/05/2011.  11.Kyphoplasty carried out by Dr. [name] on 10/13/2011  involving vertebral bodies T12, L2 and L3. Review of bone biopsy pathology from  kyphoplasties done on 10/13/2011 showed scattered plasma cells with no  kappa or lambda light chain restriction.  12. Destructive lesion of the right pubic body and adjacent rami with associated soft tissue component detected on MRI of the right hip on 11/19/2011 at [facility]. The patient received 10 radiation treatments at [facility] with improvement in pain.  13.Weight loss of approximately 35 pounds from May through June 2013.   MEDICATIONS: 1. Acyclovir 800 mg twice daily. 2. Allopurinol 100 mg daily. 3. Ativan 1 mg every 4 hours as needed. 4. Oxy IR 5 mg every 6 hours as needed.  Currently the patient is     taking 1-2 a day. 5. OxyContin 10 mg every 12 hours.  Currently the patient is taking 10     mg a day. 6. Protonix 40 mg daily. 7. Compazine 10 mg as needed for nausea every 6 hours. 8. Ambien 5 mg as needed for sleep.   SMOKING HISTORY:  The patient has smoked 2 packs of cigarettes a day for approximately 25 years but stopped smoking in 1998.   HISTORY:  [name] was seen today for followup of his kappa light chain multiple myeloma diagnosed in December 2012.  The patient is accompanied by  his significant other, [name].  He was last seen here on 01/22/2012.  Since that time, his condition has been slowly improving.  He continues to have some discomfort in his right hip.  He says this is at 2-3 level out of 10.  He has noticed a bulge in his right lower quadrant were he had a hernia repair.  This could be possibly due to his weight loss.  He denies any fever, chills, night sweats, any swelling of his legs, nausea, vomiting, diarrhea.  He denies any bleeding problems but he does bruise easily.  He is eating better. He gained a few pounds.  Energy is slowly improving.  He uses a walker some of the time.  He was seen at [facility] by Dr. Barbaraann Boys on July 24th and has another appointment to see her on September 12th.  He will be having apparently an MRI of the right hip at that time and possibly starting maintenance therapy with Revlimid.  PHYSICAL EXAM:  Mr. Waas still looks thin and chronically ill.  He is in no acute distress.  Weight is 170.8 pounds, height 6 feet even.  Body surface area 1.98 meters squared.  Blood pressure 128/79.  Other vital signs are normal.  There is no scleral icterus.  Mouth and pharynx are benign.  There is no peripheral adenopathy palpable.  Heart and lungs: Normal.  No areas of skeletal tenderness.  No Port-A-Cath or central catheter.  Abdomen:  Benign.  I tried to feel for a hernia in his right lower quadrant.  He has had surgery there, probably has a mash, but I could not determine whether a hernia was present.  Extremities:  No peripheral edema or clubbing.  He has petechial lesions over the extensor surfaces of both arms.  Neurologic:  Exam was normal.  LABORATORY DATA:  Today, white count 6.0, ANC 3.6, hemoglobin 10.4, hematocrit 31.5, platelets 142,000.  Chemistries from today were normal except for an albumin of 3.4.  BUN was 20, creatinine 1.10.  Liver function tests were normal including an LDH of 153.  Magnesium was  1.7.  IMAGING STUDIES:  1. Two-view chest x-ray on 06/28/2011 shows some mild bronchitic  changes.  2. Metastatic bone survey on 07/04/2011 showed no lytic lesions in  the visualized axial or appendicular skeleton. There was mild  superior end-plate compression deformities at T12 and L3.  3. CT-guided left iliac bone marrow aspirate and core biopsy was  carried out on 07/08/2011.  4. MRI of the lumbar spine without IV contrast on 09/30/2011 showed  progressive compression fractures at L2, L3 and T12. There is  diffuse scattered osseous metastatic disease. There was lumbar  spondylosis and degenerative disk disease causing various degrees  of impingement at all levels between L2 and S1. Progressive  collapse of L3 was noted with 75% loss of vertebral body height,  whereas on the prior study of 07/04/2011 there was 20% loss of  height. There was 6 mm of bony retropulsion. There was  progressive loss of L2 vertebral body noted with 50% loss of height  whereas formerly it was at 20% loss. There is a 50-60% loss of  vertebral body height noted at T12 minimally increased from the  prior study with 2 mm of bony retropulsion. 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 5. Chest x-ray, 2 view, from 01/06/2012 carried out at [facility] showed increased interstitial opacities, possibly representing edema or infection.   IMPRESSION AND PLAN:  Mr. Gang condition over the past several weeks has been more or less stable with some slight improvement in his performance status.  He has gained about 5 pounds during that time.  We had been watching his blood counts on a weekly basis.  He has not needed any transfusions during that time.  I would like to go ahead and get a 24-hour urine collection with a urine immunofixation electrophoresis and a urine protein to creatinine ratio. Hopefully, the urine collection can be done sometime next week.  I have asked Mr. Fullen to return  in 4 weeks which will be August 29th at which time he will have CBC and chemistries.  As stated, he will be seeing Dr. Barbaraann Boys on September 12.  I have not ordered any interim labs between now and the next visit.    ______________________________ Samul Dada, M.D. DSM/MEDQ  D:  02/25/2012  T:  02/25/2012  Job:  [id number]

## 2012-03-04 ENCOUNTER — Encounter: Payer: Self-pay | Admitting: Oncology

## 2012-03-04 LAB — UIFE/LIGHT CHAINS/TP QN, 24-HR UR
Albumin, U: DETECTED
Free Lambda Excretion/Day: 2.66 mg/d
Free Lambda Lt Chains,Ur: 0.19 mg/dL (ref 0.02–0.67)
Free Lt Chn Excr Rate: 333.2 mg/d
Time: 24 hours
Total Protein, Urine-Ur/day: 343 mg/d — ABNORMAL HIGH (ref 10–140)
Volume, Urine: 1400 mL

## 2012-03-04 NOTE — Progress Notes (Signed)
24-hour urine protein from 03/02/2012 was 343 mg as compared with 56 g on 07/22/2011. Free Kappa:Lambda ratio was 125.26 as compared with 11,200 on 07/22/2011. Urine immunofixation electrophoresis was positive for monoclonal free kappa light chains.

## 2012-03-24 ENCOUNTER — Ambulatory Visit (HOSPITAL_BASED_OUTPATIENT_CLINIC_OR_DEPARTMENT_OTHER): Payer: Medicare Other | Admitting: Oncology

## 2012-03-24 ENCOUNTER — Other Ambulatory Visit: Payer: Self-pay

## 2012-03-24 ENCOUNTER — Encounter: Payer: Self-pay | Admitting: Oncology

## 2012-03-24 ENCOUNTER — Other Ambulatory Visit (HOSPITAL_BASED_OUTPATIENT_CLINIC_OR_DEPARTMENT_OTHER): Payer: Medicare Other

## 2012-03-24 VITALS — BP 133/79 | HR 94 | Temp 97.6°F | Resp 18 | Ht 72.0 in | Wt 172.4 lb

## 2012-03-24 DIAGNOSIS — C9000 Multiple myeloma not having achieved remission: Secondary | ICD-10-CM

## 2012-03-24 DIAGNOSIS — D61818 Other pancytopenia: Secondary | ICD-10-CM

## 2012-03-24 DIAGNOSIS — N289 Disorder of kidney and ureter, unspecified: Secondary | ICD-10-CM

## 2012-03-24 DIAGNOSIS — D649 Anemia, unspecified: Secondary | ICD-10-CM

## 2012-03-24 DIAGNOSIS — E878 Other disorders of electrolyte and fluid balance, not elsewhere classified: Secondary | ICD-10-CM

## 2012-03-24 DIAGNOSIS — Z9481 Bone marrow transplant status: Secondary | ICD-10-CM

## 2012-03-24 LAB — CBC WITH DIFFERENTIAL/PLATELET
BASO%: 0.6 % (ref 0.0–2.0)
EOS%: 5 % (ref 0.0–7.0)
HCT: 29.3 % — ABNORMAL LOW (ref 38.4–49.9)
LYMPH%: 25.3 % (ref 14.0–49.0)
MCH: 34.9 pg — ABNORMAL HIGH (ref 27.2–33.4)
MCHC: 35.2 g/dL (ref 32.0–36.0)
MONO#: 0.7 10*3/uL (ref 0.1–0.9)
NEUT%: 53.3 % (ref 39.0–75.0)
Platelets: 148 10*3/uL (ref 140–400)
RBC: 2.95 10*6/uL — ABNORMAL LOW (ref 4.20–5.82)
WBC: 4.6 10*3/uL (ref 4.0–10.3)
lymph#: 1.2 10*3/uL (ref 0.9–3.3)
nRBC: 0 % (ref 0–0)

## 2012-03-24 LAB — MAGNESIUM (CC13): Magnesium: 2.1 mg/dl (ref 1.5–2.5)

## 2012-03-24 LAB — COMPREHENSIVE METABOLIC PANEL (CC13)
ALT: 14 U/L (ref 0–55)
AST: 20 U/L (ref 5–34)
Alkaline Phosphatase: 73 U/L (ref 40–150)
BUN: 22 mg/dL (ref 7.0–26.0)
Chloride: 102 mEq/L (ref 98–107)
Creatinine: 1.4 mg/dL — ABNORMAL HIGH (ref 0.7–1.3)

## 2012-03-24 MED ORDER — OXYCODONE HCL 10 MG PO TB12: 10.0000 mg | ORAL_TABLET | Freq: Two times a day (BID) | ORAL | Status: DC | PRN

## 2012-03-24 MED ORDER — OXYCODONE HCL 5 MG PO CAPS: 5.0000 mg | ORAL_CAPSULE | Freq: Four times a day (QID) | ORAL | Status: DC | PRN

## 2012-03-24 NOTE — Progress Notes (Signed)
CC:   Jeremiah Nora, MD [name], M.D. Jeremiah Purpura, MD [name], M.D. [name], M.D. Jeremiah Candle, MD  PROBLEM LIST:  1. Kappa light chain multiple myeloma, ISS III with diagnosis established in December 2012 when the patient presented with marked anemia, thrombocytopenia and extensive bone marrow replacement with immature plasma cells/plasma blasts and circulating plasma cells in  the peripheral blood. Bone marrow was carried out on 07/08/2011. In addition, the patient had severe renal insufficiency and hypercalcemia. He was requiring transfusions of platelets and red  cells. He appeared to have limited bone disease characterized by mild compression fractures of T12 and L3. A bone density scan done a couple months preceding the diagnosis had been normal. Results  of cytogenetic analysis revealed the presence of 2 clonal cell lines. The 1st cell line was chromosomally normal (20% of cells).  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 There was a gain of chromosome 7 and a marker chromosome. 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 FISH studies showed loss of chromosome 4, loss of chromosome 14, loss of chromosome 13, and loss of chromosome 17,  i.e. 4 P-, 14 Q-, 13 Q- and 17 P-. These findings would be consistent with abnormalities associated with multiple myeloma. The patient received Velcade, Cytoxan, and Decadron on a weekly basis from 07/10/2011 through 10/23/2011.    The patient developed pain in the region of his right hip during the  month of April. An MRI of the right hip at [facility] on 11/19/2011 showed a destructive lesion of the right pubic body and adjacent rami with associated soft tissue component. There was marked heterogeneous  abnormal marrow signal throughout the visualized marrow. The patient also underwent an  MRI of the lumbar spine with and without IV contrast  on 11/19/2011. Multiple compression deformities were noted, most severely at L3, where there was slight retropulsion resulting in moderate canal stenosis. An MRI of the right knee on 12/01/2011 showed no evidence of multiple myeloma; however, there was a horizontal medial meniscus tear involving the body and posterior horn and degenerative changes. These studies were carried out at [facility]. The patient underwent radiation treatments to the right hemipelvis. He received apparently 10 treatments at [facility] with some improvement in pain.   [name] then underwent high-dose chemotherapy on 11/23/2011, consisting of carmustine 600 mg IV. He received cytarabine 400 mg IV on  12/24/2011, 12/25/2011, 12/26/2011, and 12/27/2011. He received etoposide 300 mg IV daily from 12/24/2011 through 12/27/2011, four  doses. He received melphalan 280 mg IV on 12/28/2011. He was scheduled to receive autologous stem cell reinfusion on 12/29/2011. The patient's  initial hospitalization was from 12/23/2011 through 12/28/2011. He required readmission at [facility] from 01/06/2012 through 01/10/2012. He had  developed fever, shaking chills, diarrhea, weakness, and hypotension. One out of 2 blood cultures grew out gram-positive cocci, felt to be  staphylococcus from his Hickman catheter. He was treated with ceftazidime and vancomycin. Vancomycin was discontinued after a 10-day  course on 01/15/2012. Instructions at this time are to transfuse with red cells for a hemoglobin less than or equal to 9 and with platelet  transfusions for platelet count less than or equal to 10,000 or if the patient is bleeding.   2. Pancytopenia at diagnosis secondary to extensive marrow replacement by multiple myeloma.   3. Renal insufficiency, markedly  improved since diagnosis.  4. Hypercalcemia at presentation, now resolved following treatment.  5. Anemia which developed in mid January 2012 secondary  to treatment  responsive to Aranesp.  6. History of prostate cancer dating back to September 2003 treated  with robotic assisted laparoscopic radical prostatectomy by Dr.  Heloise Boyer. The tumor was pathologic T3a, involving both lobes  of the prostate with capsular extension and positive margin  focally. The patient did not receive postoperative radiation.  7. Hypertension.  8. Dyslipidemia.  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  10.Right-sided Port-A-Cath placement on 08/05/2011.  11.Kyphoplasty carried out by Dr. Julieanne Boyer on 10/13/2011  involving vertebral bodies T12, L2 and L3. Review of bone biopsy pathology from  kyphoplasties done on 10/13/2011 showed scattered plasma cells with no  kappa or lambda light chain restriction.  12. Destructive lesion of the right pubic body and adjacent rami with associated soft tissue component detected on MRI of the right hip on 11/19/2011 at [facility]. The patient received 10 radiation treatments at [facility] with improvement in pain.  13.Weight loss of approximately 35 pounds from May through June 2013.   MEDICATIONS: 1. Acyclovir 800 mg twice daily. 2. Allopurinol 100 mg daily. 3. Ativan 1 mg every 4 hours as needed. 4. Oxy IR 5 mg every 6 hours as needed.  Currently the patient is taking 10 mg once daily. 5. OxyContin 10 mg every 12 hours.  6. Protonix 40 mg daily. 7. Compazine 10 mg as needed for nausea every 6 hours. 8. Ambien 5 mg as needed for sleep.   SMOKING HISTORY:  The patient has smoked 2 packs of cigarettes a day for approximately 25 years but stopped smoking in 1998.   HISTORY:  [name] was seen today for followup of his kappa light chain multiple myeloma diagnosed in December 2012. [name] was last seen by [place] on 02/25/2012. Over the past month he has done quite well. His energy and appetite have improved and he is getting around better. He is here today by himself  and is driving. He still has pain in the region of his right groin and low back. This is stable and being well managed with OxyContin and a single dose of OxyIR daily. His hair is regrowing with some scalp discomfort. There were no new pains or complaints.   He has an appointment at [facility] with Dr. Barbaraann Boys  on September 12th.  He will also be having  an MRI of the right hip at that time and possibly starting maintenance therapy with Revlimid.  PHYSICAL EXAM:  [name] looks stronger today. Weight is 172.4 pounds as compared with 170.8 pounds on 02/25/2012. Height 6 feet even, body surface area 1.99 m, blood pressure 133/79.  Other vital signs are normal.  There is no scleral icterus.  Mouth and pharynx are benign.  There is no peripheral adenopathy palpable.  Heart and lungs: Normal. No Port-A-Cath or central catheter.  Abdomen:  Benign.  Extremities:  No peripheral edema or clubbing.  There is tenderness to palpation in the right groin area  Neurologic:  Exam was normal.  LABORATORY DATA:  Today, white count 4.6, ANC 2.5, hemoglobin 10.3, hematocrit 29.3, and platelet count 148,000. Chemistries notable for a BUN of 22, creatinine 1.4, albumin 3.4 and calcium of 10.7. Magnesium was 2.1. LDH is pending. A 24-hour urine from 03/02/2012 yielded 343 mg of protein. Urine immunofixation electrophoresis was positive for monoclonal free kappa light chains.  IMAGING STUDIES:  1. Two-view chest x-ray on 06/28/2011 shows some mild bronchitic  changes.  2. Metastatic bone survey on 07/04/2011 showed no lytic lesions in  the visualized axial or appendicular skeleton. There was mild  superior end-plate compression deformities at T12 and L3.  3. CT-guided left iliac bone marrow aspirate and core biopsy was  carried out on 07/08/2011.  4. MRI of the lumbar spine without IV contrast on 09/30/2011 showed  progressive compression fractures at L2, L3 and T12. There is  diffuse scattered osseous metastatic  disease. There was lumbar  spondylosis and degenerative disk disease causing various degrees  of impingement at all levels between L2 and S1. Progressive  collapse of L3 was noted with 75% loss of vertebral body height,  whereas on the prior study of 07/04/2011 there was 20% loss of  height. There was 6 mm of bony retropulsion. There was  progressive loss of L2 vertebral body noted with 50% loss of height  whereas formerly it was at 20% loss. There is a 50-60% loss of  vertebral body height noted at T12 minimally increased from the  prior study with 2 mm of bony retropulsion. 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 5. Chest x-ray, 2 view, from 01/06/2012 carried out at [facility] showed increased interstitial opacities, possibly representing edema or infection.   IMPRESSION AND PLAN:  [name] condition over the past month continues to show improvement. He looks stronger and is slowly gaining some weight. He continues to have discomfort in his right groin and low back. As noted above he will be seeing Dr. Barbaraann Boys at [facility] on September 12 and will be having an MRI at that time. I suspect that he will be placed on maintenance Revlimid as well.  I have asked [name] to return in about 4 weeks, at which time he will have a CBC, chemistries, LDH and a spot urine for protein to creatinine ratio.  I have also asked him to bring me a copy of the MRI report.    ______________________________ Samul Dada, M.D. DSM/MEDQ  D:  02/25/2012  T:  02/25/2012  Job:  [id number]

## 2012-03-25 ENCOUNTER — Other Ambulatory Visit: Payer: Self-pay

## 2012-03-26 ENCOUNTER — Telehealth: Payer: Self-pay | Admitting: Oncology

## 2012-03-26 NOTE — Telephone Encounter (Signed)
lmonvm adviisng the pt of his oct appts

## 2012-03-30 ENCOUNTER — Encounter: Payer: Self-pay | Admitting: Dietician

## 2012-03-30 NOTE — Progress Notes (Signed)
Brief Out-patient Oncology Nutrition Note  Reason: Patient Screened Positive For Nutrition Risk For Unintentional Weight Loss and Decreased Appetite.  Mr. Kaluzny is a 66 year old male patient of Dr. Arline Asp, diagnosed with multiple myeloma.  Contacted patient via telephone for positive nutrition risk. Patient reported his appetite and intake are very well. He is eating 75% of his meals. He reported he gained 4 lb at his last MD appointment. He dislikes nutrition supplements.   Wt Readings from Last 10 Encounters:  03/24/12 172 lb 6.4 oz (78.2 kg)  02/25/12 170 lb 12.8 oz (77.474 kg)  01/22/12 165 lb 11.2 oz (75.161 kg)  10/23/11 199 lb 11.2 oz (90.583 kg)  10/13/11 200 lb (90.719 kg)  10/05/11 201 lb 6.4 oz (91.354 kg)  09/25/11 202 lb (91.627 kg)  09/02/11 203 lb 11.2 oz (92.398 kg)  08/05/11 201 lb 6.4 oz (91.354 kg)  07/17/11 201 lb 6.4 oz (91.354 kg)     I have encouraged the patient to continue to eat regular meals daily. I have suggested the patient try carnation instant breakfast or protein powder if intake at meals becomes poor.  The patient was without any further nutrition related questions. Provided RD contact information and instructed patient to contact RD for future questions.   RD available for nutrition needs.   Iven Finn, MS, RD, LDN [phone]

## 2012-04-26 ENCOUNTER — Ambulatory Visit (HOSPITAL_BASED_OUTPATIENT_CLINIC_OR_DEPARTMENT_OTHER): Payer: Medicare Other | Admitting: Oncology

## 2012-04-26 ENCOUNTER — Telehealth: Payer: Self-pay | Admitting: Medical Oncology

## 2012-04-26 ENCOUNTER — Other Ambulatory Visit (HOSPITAL_BASED_OUTPATIENT_CLINIC_OR_DEPARTMENT_OTHER): Payer: Medicare Other

## 2012-04-26 ENCOUNTER — Telehealth: Payer: Self-pay | Admitting: Oncology

## 2012-04-26 ENCOUNTER — Encounter: Payer: Self-pay | Admitting: Oncology

## 2012-04-26 ENCOUNTER — Other Ambulatory Visit: Payer: Medicare Other | Admitting: Lab

## 2012-04-26 VITALS — BP 137/77 | HR 96 | Temp 97.4°F | Resp 20 | Ht 72.0 in | Wt 181.1 lb

## 2012-04-26 DIAGNOSIS — C9000 Multiple myeloma not having achieved remission: Secondary | ICD-10-CM

## 2012-04-26 DIAGNOSIS — Z9481 Bone marrow transplant status: Secondary | ICD-10-CM

## 2012-04-26 DIAGNOSIS — D61818 Other pancytopenia: Secondary | ICD-10-CM

## 2012-04-26 DIAGNOSIS — E878 Other disorders of electrolyte and fluid balance, not elsewhere classified: Secondary | ICD-10-CM

## 2012-04-26 DIAGNOSIS — N289 Disorder of kidney and ureter, unspecified: Secondary | ICD-10-CM

## 2012-04-26 LAB — COMPREHENSIVE METABOLIC PANEL (CC13)
AST: 15 U/L (ref 5–34)
Alkaline Phosphatase: 94 U/L (ref 40–150)
BUN: 23 mg/dL (ref 7.0–26.0)
Glucose: 85 mg/dl (ref 70–99)
Sodium: 139 mEq/L (ref 136–145)
Total Bilirubin: 0.4 mg/dL (ref 0.20–1.20)
Total Protein: 6.8 g/dL (ref 6.4–8.3)

## 2012-04-26 LAB — CBC WITH DIFFERENTIAL/PLATELET
BASO%: 0.6 % (ref 0.0–2.0)
Eosinophils Absolute: 0.4 10*3/uL (ref 0.0–0.5)
MONO#: 0.6 10*3/uL (ref 0.1–0.9)
MONO%: 12.8 % (ref 0.0–14.0)
NEUT#: 3.3 10*3/uL (ref 1.5–6.5)
RBC: 3.05 10*6/uL — ABNORMAL LOW (ref 4.20–5.82)
RDW: 13 % (ref 11.0–14.6)
WBC: 5 10*3/uL (ref 4.0–10.3)

## 2012-04-26 LAB — PROTEIN / CREATININE RATIO, URINE
Creatinine, Urine: 95.9 mg/dL
Protein Creatinine Ratio: 0.1 (ref <0.15)
Total Protein, Urine: 10 mg/dL

## 2012-04-26 NOTE — Telephone Encounter (Signed)
Gave patient appt for November 2013 lab and MD

## 2012-04-26 NOTE — Progress Notes (Signed)
CC:   [name], MD [name], M.D. [name], MD [name], M.D. [name], M.D. [name], MD  PROBLEM LIST:  1. Kappa light chain multiple myeloma, ISS III with diagnosis established in December 2012 when the patient presented with marked anemia, thrombocytopenia and extensive bone marrow replacement with immature plasma cells/plasma blasts and circulating plasma cells in  the peripheral blood. Bone marrow was carried out on 07/08/2011. In addition, the patient had severe renal insufficiency and hypercalcemia. He was requiring transfusions of platelets and red  cells. He appeared to have limited bone disease characterized by mild compression fractures of T12 and L3. A bone density scan done a couple months preceding the diagnosis had been normal. Results  of cytogenetic analysis revealed the presence of 2 clonal cell lines. The 1st cell line was chromosomally normal (20% of cells).  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 There was a gain of chromosome 7 and a marker chromosome. 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 FISH studies showed loss of chromosome 4, loss of chromosome 14, loss of chromosome 13, and loss of chromosome 17,  i.e. 4 P-, 14 Q-, 13 Q- and 17 P-. These findings would be consistent with abnormalities associated with multiple myeloma. The patient received Velcade, Cytoxan, and Decadron on a weekly basis from 07/10/2011 through 10/23/2011.  The patient developed pain in the region of his right hip during the  month of April. An MRI of the right hip at [facility] on 11/19/2011 showed a destructive lesion of the right pubic body and adjacent rami with associated soft tissue component. There was marked heterogeneous  abnormal marrow signal throughout the visualized marrow. The patient also underwent an MRI  of the lumbar spine with and without IV contrast  on 11/19/2011. Multiple compression deformities were noted, most severely at L3, where there was slight retropulsion resulting in moderate canal stenosis. An MRI of the right knee on 12/01/2011 showed no evidence of multiple myeloma; however, there was a horizontal medial meniscus tear involving the body and posterior horn and degenerative changes. These studies were carried out at [facility]. The patient underwent radiation treatments to the right hemipelvis. He received apparently 10 treatments at [facility] with some improvement in pain.  Mr. Otting then underwent high-dose chemotherapy on 11/23/2011, consisting of carmustine 600 mg IV. He received cytarabine 400 mg IV on  12/24/2011, 12/25/2011, 12/26/2011, and 12/27/2011. He received etoposide 300 mg IV daily from 12/24/2011 through 12/27/2011, four  doses. He received melphalan 280 mg IV on 12/28/2011. He was scheduled to receive autologous stem cell reinfusion on 12/29/2011. The patient's  initial hospitalization was from 12/23/2011 through 12/28/2011. He required readmission at [facility] from 01/06/2012 through 01/10/2012. He had  developed fever, shaking chills, diarrhea, weakness, and hypotension. One out of 2 blood cultures grew out gram-positive cocci, felt to be  staphylococcus from his Hickman catheter. He was treated with ceftazidime and vancomycin. Vancomycin was discontinued after a 10-day  course on 01/15/2012. Instructions at this time are to transfuse with red cells for a hemoglobin less than or equal to 9 and with platelet  transfusions for platelet count less than or equal to 10,000 or if the patient is bleeding.  2. Pancytopenia at diagnosis secondary to extensive marrow replacement by multiple myeloma.  3. Renal insufficiency, markedly improved since diagnosis.  4.  Hypercalcemia at presentation, now resolved following treatment.  5. Anemia which developed in mid January 2012 secondary to  treatment  responsive to Aranesp.  6. History of prostate cancer dating back to September 2003 treated  with robotic assisted laparoscopic radical prostatectomy by Dr.  [name]. The tumor was pathologic T3a, involving both lobes  of the prostate with capsular extension and positive margin  focally. The patient did not receive postoperative radiation.  7. Hypertension.  8. Dyslipidemia.  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  10.Right-sided Port-A-Cath placement on 08/05/2011. This was subsequently removed when the patient was at [facility] in June 2013. 11.Kyphoplasty carried out by Dr. [name] on 10/13/2011  involving vertebral bodies T12, L2 and L3. Review of bone biopsy pathology from  kyphoplasties done on 10/13/2011 showed scattered plasma cells with no  kappa or lambda light chain restriction.  12. Destructive lesion of the right pubic body and adjacent rami with associated soft tissue component detected on MRI of the right hip on 11/19/2011 at [facility]. The patient received 10 radiation treatments at [facility] with improvement in pain.  13.Weight loss of approximately 35 pounds from May through June 2013.    MEDICATIONS:  1. Acyclovir 800 mg twice daily.  2. Allopurinol 100 mg daily.  3. Ativan 1 mg every 4 hours as needed.  4. Oxy IR 5 mg every 6 hours as needed. Currently the patient is taking 10 mg once daily.  5. OxyContin 10 mg every 12 hours.  6. Protonix 40 mg daily.  7. Compazine 10 mg as needed for nausea every 6 hours.  8. Ambien 5 mg as needed for sleep.  9. Neurontin 300 mg 3 times daily started in mid September 2013 for numbness in the patient's feet.  The patient will check with Dr. Barbaraann Boys at [facility] regarding Pneumovax. He had a flu shot on 04/13/2012.   SMOKING HISTORY: The patient had smoked 2 packs of cigarettes a day for  approximately 25 years but stopped smoking in 1998.      HISTORY:  I saw  [name] today for followup of his kappa light chain multiple myeloma diagnosed in December 2012.  Mr. Forton was last seen by [place] on 03/24/2012.  He is here today by himself.  His clinical status seems to be improving in general.  He is stronger and gaining weight.  He continues to have pain in his right hip.  He developed some worsening of neuropathy in his feet, left greater than right, and when he was seen at [facility] on 04/07/2012, he was started on Neurontin.  The patient has been seen at [facility] on 04/07/2012 and again on 04/21/2012. He said he had an MRI at [facility] that showed possible fracture of his right hip area.  He is going to be seeing an orthopedic surgeon there.  In addition, apparently his urinary protein is increasing or there are other signs that his myeloma is still present or possibly progressing. He is being considered for a phase I-II study sponsored by Dr. Barbaraann Boys at [facility] utilizing bendamustine IV day 1, pomalidomide orally days 1 through 21, and Decadron either orally or IV days 1, 8, 15, and 22, all on a 28 day cycle.  More than likely, he will receive treatment at [facility].  The patient otherwise feels well.  He does use a cane.  PHYSICAL EXAMINATION:  General:  He certainly looks well and stronger.  Vital Signs:  Weight is 181.1 pounds, height  6 feet even, body surface area 2.04 sq m.  Blood pressure 137/77.  Other vital signs are normal.  HEENT:  There is no scleral icterus.  Mouth and pharynx are benign.  No thrush.  No adenopathy.  Heart and Lungs:  Normal.  No Port- A-Cath or central catheter is present.  Abdomen:  Benign.  I thought I could feel the liver descend with inspiration at the right costal margin.  The patient may have a hernia in the right lower abdomen or groin.  I could not be sure about this.  The abdomen was otherwise unremarkable.  Neurologic Exam:  Normal.  LABORATORY DATA:  Today white count 5.0, ANC 3.3, hemoglobin 11.1, hematocrit 32.1,  platelets 163,000.  Chemistries today are normal except for an albumin of 3.4.  Liver function tests were normal.  BUN 23.0, creatinine 1.3, magnesium 2.0.  A urine protein to creatinine ratio is pending.  It will be recalled that a 24-hour urine from 03/02/2012 yielded 343 mg of protein.  Urine immunofixation electrophoresis was positive for monoclonal free kappa light chains.  IMAGING STUDIES:  1. Two-view chest x-ray on 06/28/2011 shows some mild bronchitic  changes.  2. Metastatic bone survey on 07/04/2011 showed no lytic lesions in  the visualized axial or appendicular skeleton. There was mild  superior end-plate compression deformities at T12 and L3.  3. CT-guided left iliac bone marrow aspirate and core biopsy was  carried out on 07/08/2011.  4. MRI of the lumbar spine without IV contrast on 09/30/2011 showed  progressive compression fractures at L2, L3 and T12. There is  diffuse scattered osseous metastatic disease. There was lumbar  spondylosis and degenerative disk disease causing various degrees  of impingement at all levels between L2 and S1. Progressive  collapse of L3 was noted with 75% loss of vertebral body height,  whereas on the prior study of 07/04/2011 there was 20% loss of  height. There was 6 mm of bony retropulsion. There was  progressive loss of L2 vertebral body noted with 50% loss of height  whereas formerly it was at 20% loss. There is a 50-60% loss of  vertebral body height noted at T12 minimally increased from the  prior study with 2 mm of bony retropulsion. 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  5. Chest x-ray, 2 view, from 01/06/2012 carried out at [facility] showed  increased interstitial opacities, possibly representing edema or  infection.   IMPRESSION AND PLAN:  Mr. Umstead condition continues to improve.  He looks stronger and is gaining weight.  As stated above, he will probably be enrolling in a study utilizing  bendamustine, pomalidomide, and Decadron to be carried out at [facility].  He had an MRI of his right hip and will be seeing an orthopedic surgeon there.  I should mention that we do not have any of the notes from [facility] nor do we have a report of the MRI. I have asked the patient to try to obtain that information for [place].  I asked the patient to inquire about Pneumovax.  As stated, he did receive a flu shot on 04/13/2012.  We will plan to see Mr. Nylen again around November 5th, five weeks from now, at which time we will check CBC, chemistries, and a urine protein to creatinine ratio.    ______________________________ Samul Dada, M.D. DSM/MEDQ  D:  04/26/2012  T:  04/26/2012  Job:  [id number]

## 2012-04-26 NOTE — Telephone Encounter (Signed)
I called pt and left him a message to inquire if he still has his port. When he was in [place] in June he had fever,chills and had positive blood cultures and we are not sure if they took it out or just the hickman. We just need to verify.

## 2012-04-26 NOTE — Progress Notes (Signed)
This office note has been dictated.  #[id number]

## 2012-04-27 ENCOUNTER — Telehealth: Payer: Self-pay | Admitting: Medical Oncology

## 2012-04-27 ENCOUNTER — Encounter: Payer: Self-pay | Admitting: Oncology

## 2012-04-27 NOTE — Progress Notes (Signed)
Today, 04/27/2012, I received a call from the PA who works with Dr. Barbaraann Boys at [facility]. [name] is showing signs of progression with an increase in his serum kappa light chains and I believe his M spike that they are measuring on serum protein electrophoresis. An MRI that was carried out at [facility] showed a pathologic fracture of the right pubic ramus. An orthopedic surgeon had no specific recommendations other than limited weight-bearing.  It is recommended that we get the patient started on Revlimid 15 mg daily, 3 weeks on, 1 week off. In addition, patient will need to take Decadron 40 mg per week by mouth. He needs to be on aspirin 81 mg daily. It is recommended that we decrease the dose of acyclovir to 400 mg twice a day. I believe the patient is currently on 800 mg twice a day.  We should also check serum light chains, serum protein electrophoresis and a 24-hour urine protein as well as the usual labs before we start the patient on treatment. We will need to get him in for an appointment probably within the next couple weeks.  He should have an appointment at [facility] in about 6 weeks.

## 2012-04-27 NOTE — Telephone Encounter (Signed)
Pt called back to inform us that he had his port removed when he was hospitalized at [facility] in June. He had fever and positive blood cultures so they removed it. Dr. Arline Asp updated.

## 2012-04-27 NOTE — Telephone Encounter (Signed)
I left a message with pt to let him know that Dr. Arline Asp spoke with Dr. Lillia Abed PA this am regarding the study he is going to do. He is going to need the drug Revlimid. I explained there is paper work that he will need to read and sign in order for [place] to register him. I asked him to call me and left me know when he can come by and sign. Once the forms are signed and he is enrolled we can prescribe the drug. Per Dr. Arline Asp he is going set him up an appt to come in so he can discuss the revlimid before he starts. We will call him with this appt.

## 2012-04-28 ENCOUNTER — Other Ambulatory Visit: Payer: Self-pay | Admitting: Oncology

## 2012-04-28 ENCOUNTER — Telehealth: Payer: Self-pay | Admitting: Oncology

## 2012-04-28 DIAGNOSIS — C9000 Multiple myeloma not having achieved remission: Secondary | ICD-10-CM

## 2012-04-28 NOTE — Telephone Encounter (Signed)
s/w pt and he is aware of his 10/15 appt  aom

## 2012-05-04 ENCOUNTER — Telehealth: Payer: Self-pay | Admitting: Medical Oncology

## 2012-05-04 ENCOUNTER — Encounter: Payer: Self-pay | Admitting: Medical Oncology

## 2012-05-04 MED ORDER — ACYCLOVIR 400 MG PO TABS: 400.0000 mg | ORAL_TABLET | Freq: Two times a day (BID) | ORAL | Status: DC

## 2012-05-04 MED ORDER — DEXAMETHASONE 4 MG PO TABS: ORAL_TABLET | ORAL | Status: DC

## 2012-05-04 NOTE — Telephone Encounter (Signed)
I spoke with pt to let him know to call [employer] to do his survey for Revlimid. I informed him that per Dr. Barbaraann Boys he needs to decrease his Acyclovir to 400 mg BID. He also will be starting Decadron 40 mg weekly when he starts his Revlimid. He has an appt with Dr. Arline Asp Tues 10/15 to discuss new treatment. Prescription faxed to [facility] for Revlimid.

## 2012-05-05 ENCOUNTER — Encounter: Payer: Self-pay | Admitting: Oncology

## 2012-05-05 NOTE — Progress Notes (Signed)
[facility] [id number] approved revlimid 15mg  until 11/03/12.

## 2012-05-06 ENCOUNTER — Telehealth: Payer: Self-pay

## 2012-05-06 NOTE — Telephone Encounter (Signed)
[name] called for pt. He is having scratchy throat and some post nasal drip. No chest congestion, no fever. He is asking what OTC to take. He has fexofenadine at home, which I said was OK to take. Also tylenol for pain. Pt has appt 10/15 w/Dr Murinson. He is getting his revlimid and decadron, but will not start until after his appt at [facility]. Pt encouraged to call if we can assist further.

## 2012-05-09 ENCOUNTER — Encounter: Payer: Self-pay | Admitting: *Deleted

## 2012-05-09 NOTE — Progress Notes (Signed)
RECEIVED A FAX FROM [facility] CONCERNING A CONFIRMATION OF PRESCRIPTION SHIPMENT FOR REVLIMID ON 05/06/12.

## 2012-05-10 ENCOUNTER — Encounter: Payer: Self-pay | Admitting: Oncology

## 2012-05-10 ENCOUNTER — Ambulatory Visit (HOSPITAL_BASED_OUTPATIENT_CLINIC_OR_DEPARTMENT_OTHER): Payer: Medicare Other | Admitting: Oncology

## 2012-05-10 ENCOUNTER — Encounter: Payer: Self-pay | Admitting: Medical Oncology

## 2012-05-10 ENCOUNTER — Telehealth: Payer: Self-pay | Admitting: Oncology

## 2012-05-10 ENCOUNTER — Other Ambulatory Visit (HOSPITAL_BASED_OUTPATIENT_CLINIC_OR_DEPARTMENT_OTHER): Payer: Medicare Other | Admitting: Lab

## 2012-05-10 VITALS — BP 124/72 | HR 85 | Temp 97.6°F | Resp 20 | Ht 72.0 in | Wt 179.5 lb

## 2012-05-10 DIAGNOSIS — C9000 Multiple myeloma not having achieved remission: Secondary | ICD-10-CM

## 2012-05-10 LAB — CBC WITH DIFFERENTIAL/PLATELET
Basophils Absolute: 0 10*3/uL (ref 0.0–0.1)
EOS%: 5.2 % (ref 0.0–7.0)
HCT: 34.1 % — ABNORMAL LOW (ref 38.4–49.9)
HGB: 11.9 g/dL — ABNORMAL LOW (ref 13.0–17.1)
MCH: 36.3 pg — ABNORMAL HIGH (ref 27.2–33.4)
MCHC: 34.9 g/dL (ref 32.0–36.0)
MCV: 104 fL — ABNORMAL HIGH (ref 79.3–98.0)
MONO%: 13.9 % (ref 0.0–14.0)
NEUT%: 60.4 % (ref 39.0–75.0)

## 2012-05-10 LAB — COMPREHENSIVE METABOLIC PANEL (CC13)
AST: 17 U/L (ref 5–34)
Alkaline Phosphatase: 93 U/L (ref 40–150)
BUN: 24 mg/dL (ref 7.0–26.0)
Creatinine: 1.6 mg/dL — ABNORMAL HIGH (ref 0.7–1.3)
Total Bilirubin: 0.4 mg/dL (ref 0.20–1.20)

## 2012-05-10 NOTE — Progress Notes (Signed)
This office note has been dictated.  #[id number]

## 2012-05-10 NOTE — Patient Instructions (Addendum)
Revlimid 15 mg daily for 3 weeks, off 1 week. Decadron 40 mg weekly. Aspirin 81 mg daily. We will check CBCs weekly.

## 2012-05-10 NOTE — Telephone Encounter (Signed)
Gave pt appt for October and November 2013 lab and MD, appt for 11/5 with MD cancelled per Felicita Gage

## 2012-05-11 NOTE — Progress Notes (Signed)
CC:   Jeremiah Nora, MD [name], M.D. Jeremiah Purpura, MD [name], M.D. [name], M.D. Jeremiah Candle, MD    PROBLEM LIST:  1. Kappa light chain multiple myeloma, ISS III with diagnosis established in December 2012 when the patient presented with marked anemia, thrombocytopenia and extensive bone marrow replacement with immature plasma cells/plasma blasts and circulating plasma cells in  the peripheral blood. Bone marrow was carried out on 07/08/2011. In addition, the patient had severe renal insufficiency and hypercalcemia. He was requiring transfusions of platelets and red  cells. He appeared to have limited bone disease characterized by mild compression fractures of T12 and L3. A bone density scan done a couple months preceding the diagnosis had been normal. Results  of cytogenetic analysis revealed the presence of 2 clonal cell lines. The 1st cell line was chromosomally normal (20% of cells).  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 There was a gain of chromosome 7 and a marker chromosome. 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 FISH studies showed loss of chromosome 4, loss of chromosome 14, loss of chromosome 13, and loss of chromosome 17,  i.e. 4 P-, 14 Q-, 13 Q- and 17 P-. These findings would be consistent with abnormalities associated with multiple myeloma. The patient received Velcade, Cytoxan, and Decadron on a weekly basis from 07/10/2011 through 10/23/2011.  The patient developed pain in the region of his right hip during the  month of April. An MRI of the right hip at [facility] on 11/19/2011 showed a destructive lesion of the right pubic body and adjacent rami with associated soft tissue component. There was marked heterogeneous  abnormal marrow signal throughout the visualized marrow. The patient also underwent an  MRI of the lumbar spine with and without IV contrast  on 11/19/2011. Multiple compression deformities were noted, most severely at L3, where there was slight retropulsion resulting in moderate canal stenosis. An MRI of the right knee on 12/01/2011 showed no evidence of multiple myeloma; however, there was a horizontal medial meniscus tear involving the body and posterior horn and degenerative changes. These studies were carried out at [facility]. The patient underwent radiation treatments to the right hemipelvis. He received apparently 10 treatments at [facility] with some improvement in pain.  [name] then underwent high-dose chemotherapy on 11/23/2011, consisting of carmustine 600 mg IV. He received cytarabine 400 mg IV on  12/24/2011, 12/25/2011, 12/26/2011, and 12/27/2011. He received etoposide 300 mg IV daily from 12/24/2011 through 12/27/2011, four  doses. He received melphalan 280 mg IV on 12/28/2011. He was scheduled to receive autologous stem cell reinfusion on 12/29/2011. The patient's  initial hospitalization was from 12/23/2011 through 12/28/2011. He required readmission at [facility] from 01/06/2012 through 01/10/2012. He had  developed fever, shaking chills, diarrhea, weakness, and hypotension. One out of 2 blood cultures grew out gram-positive cocci, felt to be  staphylococcus from his Hickman catheter. He was treated with ceftazidime and vancomycin. Vancomycin was discontinued after a 10-day  course on 01/15/2012.  The patient was found to have progressive disease in late September 2013 as evidenced by an increase in his serum kappa light chains and serum protein M spike.  It was recommended that the patient start Revlimid 15 mg daily, 3 weeks on, 1 week off, and Decadron 40 mg weekly, in conjunction with aspirin 81 mg daily.  Those treatments will start on 05/10/2012.  2. Pancytopenia at diagnosis secondary to extensive marrow replacement by multiple myeloma.  3. Renal insufficiency, markedly  improved since diagnosis.  4. Hypercalcemia at presentation, now resolved following treatment.  5. Anemia which developed in mid January 2012 secondary to treatment  responsive to Aranesp.  6. History of prostate cancer dating back to September 2003 treated  with robotic assisted laparoscopic radical prostatectomy by Dr.  Heloise Boyer. The tumor was pathologic T3a, involving both lobes  of the prostate with capsular extension and positive margin  focally. The patient did not receive postoperative radiation.  7. Hypertension.  8. Dyslipidemia.  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  10.Right-sided Port-A-Cath placement on 08/05/2011. This was subsequently removed when the patient was at [facility] in June 2013.  11.Kyphoplasty carried out by Dr. [name] on 10/13/2011  involving vertebral bodies T12, L2 and L3. Review of bone biopsy pathology from  kyphoplasties done on 10/13/2011 showed scattered plasma cells with no  kappa or lambda light chain restriction.  12. Destructive lesion of the right pubic body and adjacent rami with associated soft tissue component detected on MRI of the right hip on 11/19/2011 at [facility]. The patient received 10 radiation treatments at [facility] with improvement in pain.  13.Weight loss of approximately 35 pounds from May through June 2013.   MEDICATIONS:  1. Acyclovir 400 mg twice daily.  2. Allopurinol 100 mg daily.  3. Ativan 1 mg every 4 hours as needed.  4. Oxy IR 5 mg every 6 hours as needed. Currently the patient is taking 10 mg once daily.  5. OxyContin 10 mg every 12 hours.  6. Protonix 40 mg daily.  7. Compazine 10 mg as needed for nausea every 6 hours.  8. Ambien 5 mg as needed for sleep.  9. Neurontin 300 mg 3 times daily started in mid September 2013 for numbness in the patient's feet.  10. Aspirin 81 mg daily. 11. Revlimid 15 mg daily, 3 weeks on, 1 week off, starting 05/10/2012. 12. Decadron  40 mg weekly, starting 05/10/2012.  Pneumovax was given on 04/28/2011 at the [facility] He had a flu shot on 04/13/2012.    SMOKING HISTORY: The patient had smoked 2 packs of cigarettes a day for  approximately 25 years but stopped smoking in 1998.    HISTORY:  Cartrell Filardi was seen today for followup of his kappa light chain multiple myeloma diagnosed in December 2012.  Mr. Lavery was accompanied by Romeo Rabon.  Mr. Siegert was last seen by [place] on 04/26/2012.  It will be recalled that he was seen at [facility] in mid September.  Apparently he was noted to have progressive disease with a rising kappa light chain and M spike in his serum.  I received a call from Dr. Lillia Abed PA, [place].  He advised [place] to start the patient on Revlimid and Decadron, as noted above, in conjunction with aspirin as prophylaxis for venous thrombosis, and to decrease the acyclovir from 800 down to 400 twice daily.  The patient is here today to begin those treatments.  As stated, he was last seen by [place] on 04/26/2012.  He denies any changes in his condition. He still has some pain in his right hip that is being well controlled with the current program of OxyContin and OxyIR.  The patient says his neuropathy involving his feet may be somewhat better.  He is without any new pains.  Energy and appetite are good.  PHYSICAL EXAM:  He looks well.  Weight is basically stable at 179.5 pounds.  Height 6 feet even.  Body surface area 2.03 meters squared. Blood pressure 124/72.  Other vital signs are normal.  There is no scleral icterus.  Mouth and pharynx are benign.  There is no peripheral adenopathy palpable.  No central catheter or port.  Heart and lungs are normal.  Abdomen is benign.  I could not appreciate his liver today. Extremities without pitting edema.  Neurologic exam was normal.  LABORATORY DATA:  Today white count is 4.3, ANC 2.6, hemoglobin 11.9, hematocrit 34.1, platelets 174,000.   Chemistries today notable for a BUN of 24, creatinine 1.6, albumin 3.5, LDH 173.  Pending are serum light chains, serum protein electrophoresis, serum immunofixation electrophoresis, quantitative immunoglobulins.  24-hour urine collection for protein, and a urine protein-to-creatinine ratio.  IMAGING STUDIES:  1. Two-view chest x-ray on 06/28/2011 shows some mild bronchitic  changes.  2. Metastatic bone survey on 07/04/2011 showed no lytic lesions in  the visualized axial or appendicular skeleton. There was mild  superior end-plate compression deformities at T12 and L3.  3. CT-guided left iliac bone marrow aspirate and core biopsy was  carried out on 07/08/2011.  4. MRI of the lumbar spine without IV contrast on 09/30/2011 showed  progressive compression fractures at L2, L3 and T12. There is  diffuse scattered osseous metastatic disease. There was lumbar  spondylosis and degenerative disk disease causing various degrees  of impingement at all levels between L2 and S1. Progressive  collapse of L3 was noted with 75% loss of vertebral body height,  whereas on the prior study of 07/04/2011 there was 20% loss of  height. There was 6 mm of bony retropulsion. There was  progressive loss of L2 vertebral body noted with 50% loss of height  whereas formerly it was at 20% loss. There is a 50-60% loss of  vertebral body height noted at T12 minimally increased from the  prior study with 2 mm of bony retropulsion. 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  5. Chest x-ray, 2 view, from 01/06/2012 carried out at [facility] showed  increased interstitial opacities, possibly representing edema or  infection.   IMPRESSION AND PLAN:  Apparently Mr. Clarkin is showing evidence for progression of disease occurring a very short time after he underwent high-dose therapy on 11/23/2011.  We are going ahead with recommendations, specifically, Revlimid 15 mg daily, 3 weeks on, 1 week off,  and Decadron 40 mg weekly.  Those treatments are starting today. Mr. Fogelman has an appointment to be seen at [facility] on 05/26/2012.  We will check CBCs weekly, specifically, 05/17/2012, 05/24/2012, and 05/31/2012.  I will plan to see Mr. Nazareno again in 4 weeks, which will be around 06/07/2012, at which time we will check CBC, chemistries, LDH, serum light chains, and urine protein-to-creatinine ratio.    ______________________________ Samul Dada, M.D. DSM/MEDQ  D:  05/10/2012  T:  05/11/2012  Job:  [id number]

## 2012-05-12 LAB — KAPPA/LAMBDA LIGHT CHAINS
Kappa free light chain: 11.2 mg/dL — ABNORMAL HIGH (ref 0.33–1.94)
Lambda Free Lght Chn: 4.83 mg/dL — ABNORMAL HIGH (ref 0.57–2.63)

## 2012-05-16 LAB — SPEP & IFE WITH QIG
Albumin ELP: 55.1 % — ABNORMAL LOW (ref 55.8–66.1)
Alpha-1-Globulin: 4.2 % (ref 2.9–4.9)
Beta 2: 4.5 % (ref 3.2–6.5)
IgM, Serum: 108 mg/dL (ref 41–251)

## 2012-05-17 ENCOUNTER — Encounter: Payer: Self-pay | Admitting: Oncology

## 2012-05-17 NOTE — Progress Notes (Signed)
Lab work from 05/10/2012 was as follows:  Serum protein electrophoresis showed an M spike of 0.28. Immunofixation electrophoresis showed a monoclonal IgG lambda protein and a monoclonal IgM kappa protein.  IgG level was 1770, whereas on 07/20/2011, the IgG level was 508. IgA level was 41 which is low and IgM level was 108 which is normal.

## 2012-05-20 ENCOUNTER — Other Ambulatory Visit (HOSPITAL_BASED_OUTPATIENT_CLINIC_OR_DEPARTMENT_OTHER): Payer: Medicare Other | Admitting: Lab

## 2012-05-20 DIAGNOSIS — Z9481 Bone marrow transplant status: Secondary | ICD-10-CM

## 2012-05-20 DIAGNOSIS — C9000 Multiple myeloma not having achieved remission: Secondary | ICD-10-CM

## 2012-05-20 DIAGNOSIS — E878 Other disorders of electrolyte and fluid balance, not elsewhere classified: Secondary | ICD-10-CM

## 2012-05-20 LAB — COMPREHENSIVE METABOLIC PANEL (CC13)
CO2: 25 mEq/L (ref 22–29)
Calcium: 9.6 mg/dL (ref 8.4–10.4)
Chloride: 109 mEq/L — ABNORMAL HIGH (ref 98–107)
Creatinine: 1.1 mg/dL (ref 0.7–1.3)
Glucose: 100 mg/dl — ABNORMAL HIGH (ref 70–99)
Total Bilirubin: 0.4 mg/dL (ref 0.20–1.20)
Total Protein: 6.8 g/dL (ref 6.4–8.3)

## 2012-05-20 LAB — CBC WITH DIFFERENTIAL/PLATELET
Eosinophils Absolute: 0 10*3/uL (ref 0.0–0.5)
HCT: 35.6 % — ABNORMAL LOW (ref 38.4–49.9)
HGB: 11.8 g/dL — ABNORMAL LOW (ref 13.0–17.1)
LYMPH%: 6 % — ABNORMAL LOW (ref 14.0–49.0)
MONO#: 1.4 10*3/uL — ABNORMAL HIGH (ref 0.1–0.9)
NEUT#: 10.8 10*3/uL — ABNORMAL HIGH (ref 1.5–6.5)
NEUT%: 82.8 % — ABNORMAL HIGH (ref 39.0–75.0)
Platelets: 169 10*3/uL (ref 140–400)
WBC: 13 10*3/uL — ABNORMAL HIGH (ref 4.0–10.3)
lymph#: 0.8 10*3/uL — ABNORMAL LOW (ref 0.9–3.3)

## 2012-05-25 ENCOUNTER — Telehealth: Payer: Self-pay | Admitting: Oncology

## 2012-05-25 NOTE — Telephone Encounter (Signed)
Changed time of 11/15 appt time per desk nurse. Also adjusted lb time. lmonvm re new time for 11/15 appt @ 1:45 pm. Also added note to 11/1 appt to send pt for new schedule.

## 2012-05-26 ENCOUNTER — Telehealth: Payer: Self-pay | Admitting: *Deleted

## 2012-05-26 NOTE — Telephone Encounter (Signed)
Patient calling in to inform us that he has appt today at [facility] with Dr Donnie Coffin and will be having labs done there too. Would like to cancel weekly CBC tomorrow to prevent duplication. Called and left voicemail for Vinnie Langton, assistant to Dr Donnie Coffin to please fax [place] the results and call back with any questions.

## 2012-05-27 ENCOUNTER — Other Ambulatory Visit: Payer: Medicare Other | Admitting: Lab

## 2012-05-30 ENCOUNTER — Telehealth: Payer: Self-pay | Admitting: Medical Oncology

## 2012-05-30 ENCOUNTER — Other Ambulatory Visit: Payer: Self-pay | Admitting: *Deleted

## 2012-05-30 ENCOUNTER — Other Ambulatory Visit: Payer: Self-pay | Admitting: Medical Oncology

## 2012-05-30 DIAGNOSIS — C9000 Multiple myeloma not having achieved remission: Secondary | ICD-10-CM

## 2012-05-30 MED ORDER — OXYCODONE HCL 10 MG PO TB12: 10.0000 mg | ORAL_TABLET | Freq: Two times a day (BID) | ORAL | Status: DC | PRN

## 2012-05-30 MED ORDER — OXYCODONE HCL 5 MG PO CAPS: 5.0000 mg | ORAL_CAPSULE | Freq: Four times a day (QID) | ORAL | Status: DC | PRN

## 2012-05-30 MED ORDER — LENALIDOMIDE 15 MG PO CAPS: 15.0000 mg | ORAL_CAPSULE | Freq: Every day | ORAL | Status: DC

## 2012-05-30 NOTE — Telephone Encounter (Signed)
I called Dr. Lillia Abed PA [place]et to see if pt will be continuing the Revlimid. He states the pt 's labs have really improved so he will continue with Revlimid and decadron. Dr. Barbaraann Boys is out of the office today but he will discuss the pt and then call us to let us know if she is going to add Velcade. I called the pt to let him know. He will complete his revlimid tomorrow then go on a 7 day break. He voiced understanding.

## 2012-05-30 NOTE — Addendum Note (Signed)
Addended by: Arvilla Meres on: 05/30/2012 04:10 PM   Modules accepted: Orders

## 2012-05-30 NOTE — Telephone Encounter (Signed)
THIS REFILL REQUEST FOR REVLIMID WAS GIVEN TO DR.MURINSON'S NURSE, ROBIN BASS,RN. 

## 2012-05-30 NOTE — Telephone Encounter (Signed)
Pt called this am asking for a refill on his pain medication. He states that he can come to [place] to pickup. He asked if Dr. Arline Asp has spoken with Dr. Barbaraann Boys. He was suppose to get a call today to let him know if he is to continue Revlimid or to start a new therapy. Per Dr. Arline Asp he has not spoken with Dr. Barbaraann Boys. I told pt that I can mail his prescriptions so he will not have to drive to [place].

## 2012-05-31 ENCOUNTER — Other Ambulatory Visit: Payer: Medicare Other | Admitting: Lab

## 2012-05-31 ENCOUNTER — Ambulatory Visit: Payer: Medicare Other | Admitting: Oncology

## 2012-06-02 NOTE — Telephone Encounter (Signed)
RECEIVED A FAX FROM [facility] CONCERNING A CONFIRMATION OF PRESCRIPTION SHIPMENT FOR REVLIMID ON 06/01/12.

## 2012-06-03 ENCOUNTER — Other Ambulatory Visit (HOSPITAL_BASED_OUTPATIENT_CLINIC_OR_DEPARTMENT_OTHER): Payer: Medicare Other

## 2012-06-03 DIAGNOSIS — C9000 Multiple myeloma not having achieved remission: Secondary | ICD-10-CM

## 2012-06-03 LAB — CBC WITH DIFFERENTIAL/PLATELET
Basophils Absolute: 0 10*3/uL (ref 0.0–0.1)
EOS%: 0.3 % (ref 0.0–7.0)
Eosinophils Absolute: 0 10*3/uL (ref 0.0–0.5)
HGB: 11.5 g/dL — ABNORMAL LOW (ref 13.0–17.1)
LYMPH%: 6.6 % — ABNORMAL LOW (ref 14.0–49.0)
MCH: 36.1 pg — ABNORMAL HIGH (ref 27.2–33.4)
MCV: 103.6 fL — ABNORMAL HIGH (ref 79.3–98.0)
MONO%: 20.1 % — ABNORMAL HIGH (ref 0.0–14.0)
NEUT#: 7.1 10*3/uL — ABNORMAL HIGH (ref 1.5–6.5)
Platelets: 181 10*3/uL (ref 140–400)
RBC: 3.2 10*6/uL — ABNORMAL LOW (ref 4.20–5.82)
RDW: 13.3 % (ref 11.0–14.6)

## 2012-06-03 LAB — COMPREHENSIVE METABOLIC PANEL (CC13)
AST: 13 U/L (ref 5–34)
Albumin: 3.2 g/dL — ABNORMAL LOW (ref 3.5–5.0)
Alkaline Phosphatase: 112 U/L (ref 40–150)
BUN: 29 mg/dL — ABNORMAL HIGH (ref 7.0–26.0)
Glucose: 106 mg/dl — ABNORMAL HIGH (ref 70–99)
Potassium: 4.2 mEq/L (ref 3.5–5.1)
Total Bilirubin: 0.3 mg/dL (ref 0.20–1.20)

## 2012-06-04 LAB — PROTEIN / CREATININE RATIO, URINE
Creatinine, Urine: 136.4 mg/dL
Protein Creatinine Ratio: 0.04 (ref <0.15)
Total Protein, Urine: 5 mg/dL

## 2012-06-06 ENCOUNTER — Other Ambulatory Visit: Payer: Self-pay | Admitting: Medical Oncology

## 2012-06-06 MED ORDER — ACYCLOVIR 400 MG PO TABS: 400.0000 mg | ORAL_TABLET | Freq: Two times a day (BID) | ORAL | Status: DC

## 2012-06-06 NOTE — Telephone Encounter (Signed)
Pt called and asked if we could send a new prescription for a 90 day supply for his acylovir. New prescription faxed and pt notified.Marland Kitchen

## 2012-06-10 ENCOUNTER — Other Ambulatory Visit (HOSPITAL_BASED_OUTPATIENT_CLINIC_OR_DEPARTMENT_OTHER): Payer: Medicare Other | Admitting: Lab

## 2012-06-10 ENCOUNTER — Telehealth: Payer: Self-pay | Admitting: Oncology

## 2012-06-10 ENCOUNTER — Encounter: Payer: Self-pay | Admitting: Oncology

## 2012-06-10 ENCOUNTER — Ambulatory Visit (HOSPITAL_BASED_OUTPATIENT_CLINIC_OR_DEPARTMENT_OTHER): Payer: Medicare Other | Admitting: Oncology

## 2012-06-10 VITALS — BP 121/66 | HR 70 | Temp 97.0°F | Resp 20 | Ht 72.0 in | Wt 186.7 lb

## 2012-06-10 DIAGNOSIS — C9000 Multiple myeloma not having achieved remission: Secondary | ICD-10-CM

## 2012-06-10 LAB — CBC WITH DIFFERENTIAL/PLATELET
Basophils Absolute: 0.1 10*3/uL (ref 0.0–0.1)
Eosinophils Absolute: 0 10*3/uL (ref 0.0–0.5)
HGB: 11.3 g/dL — ABNORMAL LOW (ref 13.0–17.1)
MONO#: 1.6 10*3/uL — ABNORMAL HIGH (ref 0.1–0.9)
MONO%: 13.5 % (ref 0.0–14.0)
NEUT#: 9.3 10*3/uL — ABNORMAL HIGH (ref 1.5–6.5)
RBC: 3.14 10*6/uL — ABNORMAL LOW (ref 4.20–5.82)
RDW: 13.7 % (ref 11.0–14.6)
WBC: 11.7 10*3/uL — ABNORMAL HIGH (ref 4.0–10.3)
lymph#: 0.8 10*3/uL — ABNORMAL LOW (ref 0.9–3.3)

## 2012-06-10 LAB — COMPREHENSIVE METABOLIC PANEL (CC13)
Albumin: 3.2 g/dL — ABNORMAL LOW (ref 3.5–5.0)
Alkaline Phosphatase: 121 U/L (ref 40–150)
BUN: 35 mg/dL — ABNORMAL HIGH (ref 7.0–26.0)
CO2: 24 mEq/L (ref 22–29)
Calcium: 8.8 mg/dL (ref 8.4–10.4)
Chloride: 109 mEq/L — ABNORMAL HIGH (ref 98–107)
Glucose: 136 mg/dl — ABNORMAL HIGH (ref 70–99)
Potassium: 3.7 mEq/L (ref 3.5–5.1)
Sodium: 140 mEq/L (ref 136–145)
Total Protein: 6.1 g/dL — ABNORMAL LOW (ref 6.4–8.3)

## 2012-06-10 LAB — PROTEIN / CREATININE RATIO, URINE: Total Protein, Urine: 4 mg/dL

## 2012-06-10 NOTE — Progress Notes (Signed)
CC:   [name], MD [name], M.D. [name], MD [name], MD [name], M.D. [name], M.D.  PROBLEM LIST:  1. Kappa light chain multiple myeloma, ISS III with diagnosis established in December 2012 when the patient presented with marked anemia, thrombocytopenia and extensive bone marrow replacement with immature plasma cells/plasma blasts and circulating plasma cells in the peripheral blood. Bone marrow was carried out on 07/08/2011. In addition, the patient had severe renal insufficiency and hypercalcemia. He was requiring transfusions of platelets and red  cells. He appeared to have limited bone disease characterized by mild compression fractures of T12 and L3. A bone density scan done a couple months preceding the diagnosis had been normal. Results of cytogenetic analysis revealed the presence of 2 clonal cell lines. The 1st cell line was chromosomally normal (20% of cells). 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 There was a gain of chromosome 7 and a marker chromosome. 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 FISH studies showed loss of chromosome 4, loss of chromosome 14, loss of chromosome 13, and loss of chromosome 17, i.e. 4 P-, 14 Q-, 13 Q- and 17 P-. These findings would be consistent with abnormalities associated with multiple myeloma. The patient received Velcade, Cytoxan, and Decadron on a weekly basis from 07/10/2011 through 10/23/2011. The patient developed pain in the region of his right hip during the month of April. An MRI of the right hip at [facility] on 11/19/2011 showed a destructive lesion of the right pubic body and adjacent rami with associated soft tissue component. There was marked heterogeneous abnormal marrow signal throughout the visualized marrow. The patient also underwent an MRI of the lumbar  spine with and without IV contrast on 11/19/2011. Multiple compression deformities were noted, most severely at L3, where there was slight retropulsion resulting in moderate canal stenosis. An MRI of the right knee on 12/01/2011 showed no evidence of multiple myeloma; however, there was a horizontal medial meniscus tear involving the body and posterior horn and degenerative changes. These studies were carried out at [facility]. The patient underwent radiation treatments to the right hemipelvis. He received apparently 10 treatments at [facility] with some improvement in pain.  Mr. Brix then underwent high-dose chemotherapy on 11/23/2011, consisting of carmustine 600 mg IV. He received cytarabine 400 mg IV on  12/24/2011, 12/25/2011, 12/26/2011, and 12/27/2011. He received etoposide 300 mg IV daily from 12/24/2011 through 12/27/2011, four doses. He received melphalan 280 mg IV on 12/28/2011. He was scheduled to receive autologous stem cell reinfusion on 12/29/2011. The patient's initial hospitalization was from 12/23/2011 through 12/28/2011. He required readmission at [facility] from 01/06/2012 through 01/10/2012. He had developed fever, shaking chills, diarrhea, weakness, and hypotension. One out of 2 blood cultures grew out gram-positive cocci, felt to be staphylococcus from his Hickman catheter. He was treated with ceftazidime and vancomycin. Vancomycin was discontinued after a 10-day course on 01/15/2012.  The patient was found to have progressive disease in late September 2013 as  evidenced by an increase in his serum kappa light chains and serum  protein M spike. It was recommended that the patient start Revlimid 15  mg daily, 3 weeks on, 1 week off, and Decadron 40 mg weekly, in  conjunction with aspirin 81 mg daily. Those treatments started on  05/11/2012.    2. Pancytopenia at diagnosis  secondary to extensive marrow replacement by multiple myeloma.  3. Renal insufficiency, markedly improved since diagnosis.  4.  Hypercalcemia at presentation, now resolved following treatment.  5. Anemia which developed in mid January 2012 secondary to treatment  responsive to Aranesp.  6. History of prostate cancer dating back to September 2003 treated  with robotic assisted laparoscopic radical prostatectomy by Dr.  [name]. The tumor was pathologic T3a, involving both lobes  of the prostate with capsular extension and positive margin  focally. The patient did not receive postoperative radiation.  7. Hypertension.  8. Dyslipidemia.  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  10.Right-sided Port-A-Cath placement on 08/05/2011. This was subsequently removed when the patient was at [facility] in June 2013.  11.Kyphoplasty carried out by Dr. [name] on 10/13/2011  involving vertebral bodies T12, L2 and L3. Review of bone biopsy pathology from  kyphoplasties done on 10/13/2011 showed scattered plasma cells with no  kappa or lambda light chain restriction.  12. Destructive lesion of the right pubic body and adjacent rami with associated soft tissue component detected on MRI of the right hip on 11/19/2011 at [facility]. The patient received 10 radiation treatments at [facility] with improvement in pain.  13.Weight loss of approximately 35 pounds from May through June 2013.    MEDICATIONS:  1. Acyclovir 400 mg twice daily.  2. Allopurinol 100 mg daily.  3. Ativan 1 mg every 4 hours as needed.  4. OxyIR 5 mg every 6 hours as needed. Currently the patient is taking 10 mg once daily.  5. OxyContin 10 mg every 12 hours.  6. Protonix 40 mg daily.  7. Compazine 10 mg as needed for nausea every 6 hours.  8. Ambien 5 mg as needed for sleep.  9. Neurontin 300 mg 3 times daily started in mid September 2013 for numbness in the patient's feet.  10. Aspirin 81 mg daily.  11. Revlimid 15 mg daily, 3 weeks on, 1 week off, starting 05/11/2012.  12. Decadron 40 mg weekly, starting  05/11/2012.   Pneumovax was given on 04/28/2011 at the [facility].  He had a flu shot on 04/13/2012.   SMOKING HISTORY: The patient had smoked 2 packs of cigarettes a day for  approximately 25 years but stopped smoking in 1998.     HISTORY:  I saw Haiden Rawlinson today for followup of his kappa light chain multiple myeloma diagnosed in December 2012.  Mr. Clausing was accompanied by Romeo Rabon.  He was last seen by [place] on 05/10/2012. Mr. Ishida was seen at [facility] on 10/31.  Apparently he got a good report saying that his serum protein levels had decreased after he had been started on Revlimid and Decadron on 10/16.  It will be recalled that the patient takes 15 mg of Revlimid daily.  He took this for 3 weeks through 11/05 and just started taking Revlimid on 11/13.  It is a 3 week on 1 week off schedule.  In addition, he takes Decadron 40 mg weekly every Wednesday.  The patient does notice some shaking for a few days, also hiccoughs after he has taken the Decadron.  He also noted that the pain he was having in his right groin area went away after he had taken the Revlimid.  He also noted some slight recurrence of the pain by the end of the week that he was off the Revlimid.  The patient has some discomfort in his legs which he  says started about a year ago when he started the chemotherapy.  This almost sounds like a neuropathy.  In addition, he is having some problems with his veins. The lab is having to use veins in his hands.  It will be recalled that he previously did have a Port-A-Cath which was removed at [facility] when he underwent the high dose therapy and stem cell transplant.  The patient's pain seems to be under reasonable control.  Other issues that he raised have to do with Zometa, immunizations and perhaps getting a Port-A-Cath after the holidays have passed.  All things considered Mr. Murdaugh seems to be doing quite well.  PHYSICAL EXAMINATION:  He looks well.  He  has gained approximately 7 or 8 pounds.  His weight now is 186.7 pounds, height 6 feet even.  Body surface area 2.07 m2.  Blood pressure 121/66.  Other vital signs are normal.  There is no scleral icterus.  Mouth and pharynx benign.  No thrush.  There is no peripheral adenopathy palpable.  Heart and lungs are normal.  There is no central catheter or port.  Abdomen is benign. Extremities:  No peripheral edema.  Neurologic exam is normal.  No areas of musculoskeletal tenderness.  LABORATORY DATA:  White count 11.7, ANC 9.3, hemoglobin 11.3, hematocrit 32.2, platelets 173,000.  Chemistries today were notable for a BUN of 35.0, creatinine 1.2, albumin 3.2.  Total protein 6.1, LDH 140.  On 05/10/2012 IgG level was 1770 which is slightly elevated with normal being 650 to 1600.  IgA was 41 which is low, IgM 108.  Serum protein electrophoresis showed an M spike of 0.28.  Gammaglobulin was 21.2%. Kappa free light chains were 11.2 and the kappa lambda ratio was 2.32 with normal being 0.26 to 1.65.  On 06/03/2012 urine protein to creatinine ratio was 0.04 which is normal.  On 04/26/2012 urine protein to creatinine ratio was 0.10 which also is normal.  A 24 hour urine collection on 03/02/2012 yielded 343 mg of protein with almost all of it being kappa light chain, specifically 333 mg being kappa light chain.  IMAGING STUDIES:  1. Two-view chest x-ray on 06/28/2011 shows some mild bronchitic  changes.  2. Metastatic bone survey on 07/04/2011 showed no lytic lesions in  the visualized axial or appendicular skeleton. There was mild  superior end-plate compression deformities at T12 and L3.  3. CT-guided left iliac bone marrow aspirate and core biopsy was  carried out on 07/08/2011.  4. MRI of the lumbar spine without IV contrast on 09/30/2011 showed  progressive compression fractures at L2, L3 and T12. There is  diffuse scattered osseous metastatic disease. There was lumbar  spondylosis and  degenerative disk disease causing various degrees  of impingement at all levels between L2 and S1. Progressive  collapse of L3 was noted with 75% loss of vertebral body height,  whereas on the prior study of 07/04/2011 there was 20% loss of  height. There was 6 mm of bony retropulsion. There was  progressive loss of L2 vertebral body noted with 50% loss of height  whereas formerly it was at 20% loss. There is a 50-60% loss of  vertebral body height noted at T12 minimally increased from the  prior study with 2 mm of bony retropulsion. 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  5. Chest x-ray, 2 view, from 01/06/2012 carried out at [facility] showed  increased interstitial opacities, possibly representing edema or  infection. 6. MRI  of the right hip carried out at [facility] on 04/07/2012 and compared with the prior MRI of the right hip on 11/19/2011 showed interval development of a linear area of abnormal signal seen through the site of previous soft tissue mass within the right superior pubic rami.  Findings were felt to represent a pathologic fracture most likely due to the underlying tumor involvement and/or an element of underlying radiation change.  There was also re-demonstration of myelomatous metastatic lesions seen within the right hemipelvis and right proximal femur. Findings appear to be similar to the previous study.  There was interval improvement of the previously visualized soft tissue mass involving the superior and inferior pubic rami on the right.  There continued to be mild cortical expansion and destruction within this region.   IMPRESSION AND PLAN:  Mr. Torok seems to be doing fairly well.  He has just started his second course of Revlimid.  As stated, he takes Decadron 40 mg weekly. He had seen Dr. Barbaraann Boys on 10/31.  She wanted him to continue with the current Decadron dose perhaps even consider increasing it.  The patient will continue as we are  doing.  We had been checking weekly CBCs.  We will check CBCs approximately every 2 weeks.  These are scheduled for 11/27, 12/11 and I will plan to see Mr. Tolson again somewhere around 12/26.  Mr. Vicuna tells me that the lab is having trouble finding veins.  He may need a Port-A-Cath for blood drawings.  We will also need to check with Dr. Barbaraann Boys about immunizations and Zometa.  Apparently the issue of Zometa was mentioned to the patient.  In the past we have held off the Zometa because of the previous severe renal insufficiency that the patient had at the time of presentation.  In looking over his chart Mr. Villwock underwent high-dose chemotherapy around 11/23/2011 thus he is approaching 7 months from the time of that treatment.  As stated, we will see him again around 12/26.  At that time we will check CBC, chemistries, serum protein electrophoresis and serum for light chains.    ______________________________ Samul Dada, M.D. DSM/MEDQ  D:  06/10/2012  T:  06/10/2012  Job:  [id number]

## 2012-06-10 NOTE — Telephone Encounter (Signed)
Gave pt appts for 11/21, 12/11, + 07/21/12.

## 2012-06-10 NOTE — Progress Notes (Signed)
This office note has been dictated.  #[id number]

## 2012-06-13 ENCOUNTER — Telehealth: Payer: Self-pay

## 2012-06-13 NOTE — Telephone Encounter (Signed)
[name] returned my call from Dr Lillia Abed office. She stated that immunizations would not be considered for at least a year and that blood works would probably be done before any immunizations. She did not answer the question about if Dr Arline Asp needs to treat the pt with Zometa. A voice mail was left with Vinnie Langton to see if pt needs zometa.

## 2012-06-14 ENCOUNTER — Telehealth: Payer: Self-pay

## 2012-06-14 LAB — PROTEIN ELECTROPHORESIS, SERUM
Alpha-1-Globulin: 5 % — ABNORMAL HIGH (ref 2.9–4.9)
Alpha-2-Globulin: 12 % — ABNORMAL HIGH (ref 7.1–11.8)
Beta Globulin: 4.9 % (ref 4.7–7.2)
Gamma Globulin: 16.2 % (ref 11.1–18.8)

## 2012-06-14 NOTE — Telephone Encounter (Signed)
Micah Flesher coordinator with Dr Barbaraann Boys states that Dr Barbaraann Boys said it was safe to restart Zometa

## 2012-06-16 ENCOUNTER — Other Ambulatory Visit (HOSPITAL_BASED_OUTPATIENT_CLINIC_OR_DEPARTMENT_OTHER): Payer: Medicare Other | Admitting: Lab

## 2012-06-16 DIAGNOSIS — C9000 Multiple myeloma not having achieved remission: Secondary | ICD-10-CM

## 2012-06-16 LAB — CBC WITH DIFFERENTIAL/PLATELET
Basophils Absolute: 0 10*3/uL (ref 0.0–0.1)
Eosinophils Absolute: 0 10*3/uL (ref 0.0–0.5)
HGB: 12.2 g/dL — ABNORMAL LOW (ref 13.0–17.1)
MCV: 103.4 fL — ABNORMAL HIGH (ref 79.3–98.0)
MONO#: 0.3 10*3/uL (ref 0.1–0.9)
MONO%: 2.6 % (ref 0.0–14.0)
NEUT#: 10 10*3/uL — ABNORMAL HIGH (ref 1.5–6.5)
Platelets: 147 10*3/uL (ref 140–400)
RBC: 3.4 10*6/uL — ABNORMAL LOW (ref 4.20–5.82)
RDW: 14.4 % (ref 11.0–14.6)
WBC: 10.9 10*3/uL — ABNORMAL HIGH (ref 4.0–10.3)

## 2012-06-17 ENCOUNTER — Other Ambulatory Visit: Payer: Medicare Other | Admitting: Lab

## 2012-06-20 ENCOUNTER — Other Ambulatory Visit: Payer: Self-pay | Admitting: Medical Oncology

## 2012-06-20 ENCOUNTER — Telehealth: Payer: Self-pay | Admitting: Medical Oncology

## 2012-06-20 ENCOUNTER — Encounter: Payer: Self-pay | Admitting: Medical Oncology

## 2012-06-20 MED ORDER — GABAPENTIN 300 MG PO CAPS: 300.0000 mg | ORAL_CAPSULE | Freq: Four times a day (QID) | ORAL | Status: DC

## 2012-06-20 NOTE — Telephone Encounter (Signed)
Pt called stating that when he was here to see Dr. Arline Asp at his last visit he had mentioned that he could increase his gabapentin for his neuropathy. He is currently taking 300 mg three times per day. He states he has 3 refills on his prescription. I informed him that Dr. Arline Asp is not in the office today but I will ask him when he returns tomorrow. I also informed him we will need to call in a new prescription if he increases the dose. He voiced understanding.

## 2012-06-20 NOTE — Telephone Encounter (Signed)
I called pt to let him know per Dr. Arline Asp he can increase his gabapentin 300 mg from three times a day to four times a day. He voiced understanding. New prescription sent to his pharmacy.

## 2012-06-22 ENCOUNTER — Encounter: Payer: Self-pay | Admitting: Oncology

## 2012-06-22 NOTE — Progress Notes (Signed)
Patient needs dental clearance before we start Zometa. I am considering treating him every 2 months for approximately 2 years.

## 2012-06-24 ENCOUNTER — Other Ambulatory Visit: Payer: Medicare Other | Admitting: Lab

## 2012-06-27 ENCOUNTER — Telehealth: Payer: Self-pay | Admitting: Medical Oncology

## 2012-06-27 ENCOUNTER — Other Ambulatory Visit: Payer: Self-pay | Admitting: Medical Oncology

## 2012-06-27 ENCOUNTER — Encounter: Payer: Self-pay | Admitting: Medical Oncology

## 2012-06-27 NOTE — Telephone Encounter (Signed)
Pt called and states that he did not have a lab last week. I looked at the schedule and it was not scheduled. I spoke with Melissa and she will schedule for the am. POF sent and pt is aware he will be called with a time for the am.

## 2012-06-28 ENCOUNTER — Other Ambulatory Visit: Payer: Self-pay | Admitting: *Deleted

## 2012-06-28 ENCOUNTER — Other Ambulatory Visit (HOSPITAL_BASED_OUTPATIENT_CLINIC_OR_DEPARTMENT_OTHER): Payer: Medicare Other | Admitting: Lab

## 2012-06-28 ENCOUNTER — Telehealth: Payer: Self-pay | Admitting: *Deleted

## 2012-06-28 DIAGNOSIS — E878 Other disorders of electrolyte and fluid balance, not elsewhere classified: Secondary | ICD-10-CM

## 2012-06-28 DIAGNOSIS — Z9481 Bone marrow transplant status: Secondary | ICD-10-CM

## 2012-06-28 DIAGNOSIS — C9000 Multiple myeloma not having achieved remission: Secondary | ICD-10-CM

## 2012-06-28 LAB — CBC WITH DIFFERENTIAL/PLATELET
BASO%: 0.3 % (ref 0.0–2.0)
EOS%: 38.5 % — ABNORMAL HIGH (ref 0.0–7.0)
HCT: 32.4 % — ABNORMAL LOW (ref 38.4–49.9)
LYMPH%: 9 % — ABNORMAL LOW (ref 14.0–49.0)
MCH: 35.5 pg — ABNORMAL HIGH (ref 27.2–33.4)
MCHC: 34.1 g/dL (ref 32.0–36.0)
MONO#: 1 10*3/uL — ABNORMAL HIGH (ref 0.1–0.9)
NEUT%: 36 % — ABNORMAL LOW (ref 39.0–75.0)
Platelets: 99 10*3/uL — ABNORMAL LOW (ref 140–400)

## 2012-06-28 NOTE — Telephone Encounter (Signed)
THIS REFILL REQUEST FOR REVLIMID WAS GIVEN TO DR.MURINSON'S NURSE, ROBIN BASS,RN. 

## 2012-06-28 NOTE — Telephone Encounter (Signed)
Patient confirmed over the phone the new date and time for lab only for 06-28-2012

## 2012-06-29 ENCOUNTER — Other Ambulatory Visit: Payer: Self-pay | Admitting: *Deleted

## 2012-06-29 DIAGNOSIS — C9000 Multiple myeloma not having achieved remission: Secondary | ICD-10-CM

## 2012-06-29 MED ORDER — LENALIDOMIDE 15 MG PO CAPS: 15.0000 mg | ORAL_CAPSULE | Freq: Every day | ORAL | Status: DC

## 2012-07-01 NOTE — Telephone Encounter (Signed)
RECEIVED A FAX FROM [employer] CONCERNING A CONFIRMATION OF PRESCRIPTION SHIPMENT FOR REVLIMID ON 12 05/13. 

## 2012-07-04 ENCOUNTER — Telehealth: Payer: Self-pay | Admitting: Medical Oncology

## 2012-07-04 NOTE — Telephone Encounter (Signed)
Pt called stating that he has a cold. He just wanted to make sure he can take his decadron on Wed. He is on his week break off revlimid. Per Dr. Arline Asp he can take it. Pt voiced understanding.

## 2012-07-06 ENCOUNTER — Ambulatory Visit (INDEPENDENT_AMBULATORY_CARE_PROVIDER_SITE_OTHER): Payer: Self-pay | Admitting: General Surgery

## 2012-07-06 ENCOUNTER — Other Ambulatory Visit (HOSPITAL_BASED_OUTPATIENT_CLINIC_OR_DEPARTMENT_OTHER): Payer: Medicare Other

## 2012-07-06 DIAGNOSIS — C9000 Multiple myeloma not having achieved remission: Secondary | ICD-10-CM

## 2012-07-06 LAB — CBC WITH DIFFERENTIAL/PLATELET
BASO%: 1.2 % (ref 0.0–2.0)
EOS%: 10.7 % — ABNORMAL HIGH (ref 0.0–7.0)
HCT: 33.4 % — ABNORMAL LOW (ref 38.4–49.9)
MCH: 34.2 pg — ABNORMAL HIGH (ref 27.2–33.4)
MCHC: 34.1 g/dL (ref 32.0–36.0)
NEUT%: 51.7 % (ref 39.0–75.0)
lymph#: 0.7 10*3/uL — ABNORMAL LOW (ref 0.9–3.3)

## 2012-07-20 ENCOUNTER — Other Ambulatory Visit: Payer: Self-pay | Admitting: Oncology

## 2012-07-20 DIAGNOSIS — C9000 Multiple myeloma not having achieved remission: Secondary | ICD-10-CM

## 2012-07-21 ENCOUNTER — Telehealth: Payer: Self-pay | Admitting: Oncology

## 2012-07-21 ENCOUNTER — Ambulatory Visit (HOSPITAL_BASED_OUTPATIENT_CLINIC_OR_DEPARTMENT_OTHER): Payer: Medicare Other | Admitting: Oncology

## 2012-07-21 ENCOUNTER — Other Ambulatory Visit: Payer: Self-pay

## 2012-07-21 ENCOUNTER — Encounter: Payer: Self-pay | Admitting: Oncology

## 2012-07-21 ENCOUNTER — Other Ambulatory Visit (HOSPITAL_BASED_OUTPATIENT_CLINIC_OR_DEPARTMENT_OTHER): Payer: Medicare Other | Admitting: Lab

## 2012-07-21 VITALS — BP 138/74 | HR 98 | Temp 97.1°F | Resp 20 | Ht 72.0 in | Wt 199.7 lb

## 2012-07-21 DIAGNOSIS — C9000 Multiple myeloma not having achieved remission: Secondary | ICD-10-CM

## 2012-07-21 DIAGNOSIS — C61 Malignant neoplasm of prostate: Secondary | ICD-10-CM

## 2012-07-21 DIAGNOSIS — Z9484 Stem cells transplant status: Secondary | ICD-10-CM

## 2012-07-21 DIAGNOSIS — K219 Gastro-esophageal reflux disease without esophagitis: Secondary | ICD-10-CM

## 2012-07-21 DIAGNOSIS — C801 Malignant (primary) neoplasm, unspecified: Secondary | ICD-10-CM

## 2012-07-21 LAB — COMPREHENSIVE METABOLIC PANEL (CC13)
ALT: 23 U/L (ref 0–55)
Albumin: 3.4 g/dL — ABNORMAL LOW (ref 3.5–5.0)
CO2: 22 mEq/L (ref 22–29)
Glucose: 141 mg/dl — ABNORMAL HIGH (ref 70–99)
Potassium: 3.7 mEq/L (ref 3.5–5.1)
Sodium: 140 mEq/L (ref 136–145)
Total Protein: 6.1 g/dL — ABNORMAL LOW (ref 6.4–8.3)

## 2012-07-21 LAB — LACTATE DEHYDROGENASE (CC13): LDH: 148 U/L (ref 125–245)

## 2012-07-21 LAB — CBC WITH DIFFERENTIAL/PLATELET
BASO%: 0.5 % (ref 0.0–2.0)
EOS%: 0 % (ref 0.0–7.0)
MCH: 36.2 pg — ABNORMAL HIGH (ref 27.2–33.4)
MCHC: 34.6 g/dL (ref 32.0–36.0)
MCV: 104.6 fL — ABNORMAL HIGH (ref 79.3–98.0)
MONO%: 7.6 % (ref 0.0–14.0)
NEUT#: 6.2 10*3/uL (ref 1.5–6.5)
RBC: 3.32 10*6/uL — ABNORMAL LOW (ref 4.20–5.82)
RDW: 17 % — ABNORMAL HIGH (ref 11.0–14.6)

## 2012-07-21 MED ORDER — OXYCODONE HCL 5 MG PO CAPS: 5.0000 mg | ORAL_CAPSULE | Freq: Four times a day (QID) | ORAL | Status: DC | PRN

## 2012-07-21 MED ORDER — OXYCODONE HCL 10 MG PO TB12: 10.0000 mg | ORAL_TABLET | Freq: Two times a day (BID) | ORAL | Status: DC | PRN

## 2012-07-21 MED ORDER — PANTOPRAZOLE SODIUM 40 MG PO TBEC: 40.0000 mg | DELAYED_RELEASE_TABLET | Freq: Every day | ORAL | Status: DC

## 2012-07-21 MED ORDER — OXYCODONE HCL 5 MG PO TABS: 5.0000 mg | ORAL_TABLET | Freq: Four times a day (QID) | ORAL | Status: DC | PRN

## 2012-07-21 NOTE — Progress Notes (Signed)
This office note has been dictated.  #[id number]

## 2012-07-21 NOTE — Patient Instructions (Signed)
Please check with your dentist about Zometa to strengthen your bones.

## 2012-07-21 NOTE — Telephone Encounter (Signed)
appts made and printed for pt,pt aware that i will get back to him with the 2/6 appt time

## 2012-07-22 ENCOUNTER — Other Ambulatory Visit: Payer: Self-pay | Admitting: *Deleted

## 2012-07-22 ENCOUNTER — Telehealth: Payer: Self-pay | Admitting: Oncology

## 2012-07-22 NOTE — Telephone Encounter (Signed)
THIS REFILL REQUEST FOR REVLIMID WAS PLACED IN DR.MURINSON'S NURSE'S BOX.

## 2012-07-22 NOTE — Telephone Encounter (Signed)
lm on cell nu,ber with appt as the home phone has a vm that has not been set up       [employer]

## 2012-07-22 NOTE — Progress Notes (Signed)
CC:   Jeremiah Candle, MD [name], M.D. Jeremiah Nora, MD Jeremiah Purpura, MD [name], M.D. [name], M.D.   PROBLEM LIST:  1. Kappa light chain multiple myeloma, ISS III with diagnosis established in December 2012 when the patient presented with marked anemia, thrombocytopenia and extensive bone marrow replacement with immature plasma cells/plasma blasts and circulating plasma cells in the peripheral blood. Bone marrow was carried out on 07/08/2011. In addition, the patient had severe renal insufficiency and hypercalcemia. He was requiring transfusions of platelets and red  cells. He appeared to have limited bone disease characterized by mild compression fractures of T12 and L3. A bone density scan done a couple months preceding the diagnosis had been normal. Results of cytogenetic analysis revealed the presence of 2 clonal cell lines. The 1st cell line was chromosomally normal (20% of cells). 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 There was a gain of chromosome 7 and a marker chromosome. 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 FISH studies showed loss of chromosome 4, loss of chromosome 14, loss of chromosome 13, and loss of chromosome 17, i.e. 4 P-, 14 Q-, 13 Q- and 17 P-. These findings would be consistent with abnormalities associated with multiple myeloma. The patient received Velcade, Cytoxan, and Decadron on a weekly basis from 07/10/2011 through 10/23/2011. The patient developed pain in the region of his right hip during the month of Nov 10, 2022. An MRI of the right hip at [facility] on 11/19/2011 showed a destructive lesion of the right pubic body and adjacent rami with associated soft tissue component. There was marked heterogeneous abnormal marrow signal throughout the visualized marrow. The patient also underwent an MRI of the lumbar  spine with and without IV contrast on 11/19/2011. Multiple compression deformities were noted, most severely at L3, where there was slight retropulsion resulting in moderate canal stenosis. An MRI of the right knee on 12/01/2011 showed no evidence of multiple myeloma; however, there was a horizontal medial meniscus tear involving the body and posterior horn and degenerative changes. These studies were carried out at [facility]. The patient underwent radiation treatments to the right hemipelvis. He received apparently 10 treatments at [facility] with some improvement in pain.  Mr. Somera then underwent high-dose chemotherapy on 11/23/2011, consisting of carmustine 600 mg IV. He received cytarabine 400 mg IV on  12/24/2011, 12/25/2011, 12/26/2011, and 12/27/2011. He received etoposide 300 mg IV daily from 12/24/2011 through 12/27/2011, four doses. He received melphalan 280 mg IV on 12/28/2011. He was scheduled to receive autologous stem cell reinfusion on 12/29/2011. The patient's initial hospitalization was from 12/23/2011 through 12/28/2011. He required readmission at [facility] from 01/06/2012 through 01/10/2012. He had developed fever, shaking chills, diarrhea, weakness, and hypotension. One out of 2 blood cultures grew out gram-positive cocci, felt to be staphylococcus from his Hickman catheter. He was treated with ceftazidime and vancomycin. Vancomycin was discontinued after a 10-day course on 01/15/2012.  The patient was found to have progressive disease in late September 2013 as  evidenced by an increase in his serum kappa light chains and serum  protein M spike. It was recommended that the patient start Revlimid 15  mg daily, 3 weeks on, 1 week off, and Decadron 40 mg weekly, in  conjunction with aspirin 81 mg daily. Those treatments started on  05/11/2012.  2. Pancytopenia at diagnosis secondary  to extensive marrow replacement by multiple myeloma.  3. Renal insufficiency, markedly improved since diagnosis.  4.  Hypercalcemia at presentation, now resolved following treatment.  5. Anemia which developed in mid January 2012 secondary to treatment  responsive to Aranesp.  6. History of prostate cancer dating back to September 2003 treated  with robotic assisted laparoscopic radical prostatectomy by Dr.  Heloise Boyer. The tumor was pathologic T3a, involving both lobes  of the prostate with capsular extension and positive margin  focally. The patient did not receive postoperative radiation.  7. Hypertension.  8. Dyslipidemia.  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  10.Right-sided Port-A-Cath placement on 08/05/2011. This was subsequently removed when the patient was at [facility] in June 2013.  11.Kyphoplasty carried out by Dr. [name] on 10/13/2011  involving vertebral bodies T12, L2 and L3. Review of bone biopsy pathology from  kyphoplasties done on 10/13/2011 showed scattered plasma cells with no  kappa or lambda light chain restriction.  12. Destructive lesion of the right pubic body and adjacent rami with associated soft tissue component detected on MRI of the right hip on 11/19/2011 at [facility]. The patient received 10 radiation treatments at [facility] with improvement in pain.  13.Weight loss of approximately 35 pounds from May through June 2013.      MEDICATIONS: Were reviewed and recorded.  Current Outpatient Prescriptions  Medication Sig Dispense Refill  . acyclovir (ZOVIRAX) 400 MG tablet Take 1 tablet (400 mg total) by mouth 2 (two) times daily.  180 tablet  3  . allopurinol (ZYLOPRIM) 100 MG tablet Take 100 mg by mouth daily.       Marland Kitchen aspirin 81 MG tablet Take 81 mg by mouth daily.      Marland Kitchen dexamethasone (DECADRON) 4 MG tablet Take 10 tablets = 40 mg once a week  40 tablet  3  . gabapentin (NEURONTIN) 300 MG capsule Take 1 capsule (300 mg total) by mouth 4 (four) times daily.  360 capsule  2  . lenalidomide (REVLIMID) 15 MG capsule Take 1  capsule (15 mg total) by mouth daily. Take for 21 days then rest for 7 days  21 capsule  0  . prochlorperazine (COMPAZINE) 10 MG tablet Take 10 mg by mouth every 6 (six) hours as needed.      Marland Kitchen oxyCODONE (OXY IR/ROXICODONE) 5 MG immediate release tablet Take 1 tablet (5 mg total) by mouth every 6 (six) hours as needed for pain.  90 tablet  0  . oxyCODONE (OXYCONTIN) 10 MG 12 hr tablet Take 1 tablet (10 mg total) by mouth every 12 (twelve) hours as needed.  120 tablet  0  . pantoprazole (PROTONIX) 40 MG tablet Take 1 tablet (40 mg total) by mouth daily at 12 noon.  90 tablet  3  . zolpidem (AMBIEN) 5 MG tablet Take 5 mg by mouth at bedtime as needed. For sleep      Revlimid 15 mg daily, 3 weeks on, 1 week off.  Revlimid was started     on 05/11/2012. Decadron 40 mg weekly.  This was started on 05/11/2012. OxyIR 10 mg once daily.     Pneumovax was given on 04/28/2011 at the [facility].  He had a flu shot on 04/13/2012.   SMOKING HISTORY: The patient had smoked 2 packs of cigarettes a day for  approximately 25 years but stopped smoking in 1998.   HISTORY:  Elster Corbello was seen today for followup of his kappa  light chain multiple myeloma diagnosed in December 2012.  The patient was accompanied by his partner Romeo Rabon.  He was last seen by [place] on 06/10/2012.  Over the past 6 weeks Mr. Mclees has done fairly well.  He continues on Revlimid 15 mg daily, 3 weeks on, 1 week off.  He also continues on Decadron 40 mg weekly.  Mr. Chatmon tells me that he started the Revlimid 2 weeks ago and will be done with this course on December 31st. He has had an excellent appetite, probably helped by the Decadron.  Over the past 6 weeks he has gained approximately 13 pounds and is now back to his same weight of approximately a year ago.  At one point, the patient's weight had gotten down to about 166 pounds back in late June 2013 following his high-dose therapy and autologous stem cell  transplant in late April 2013.  The patient has noted some hesitancy over the past month or so.  I have suggested he see his urologist.  It will be recalled that he did undergo a robotic-assisted laparoscopic radical prostatectomy back in September 2003 for prostate cancer.  The patient denies any other urinary symptoms.  He continues to have pain in the region of his right hip. This occurs intermittently, it is worse with activity.  Overall there is little change in this.  The patient's symptoms seem to be fairly well controlled on OxyContin 10 mg twice daily, also OxyIR 10 mg which he takes in mid day.  The peripheral neuropathy that he was experiencing seems to have improved with a slightly higher dose of Neurontin.  He is now taking 300 mg 4 times daily.  Aside from this, he feels quite well. He was telling me that he belongs to a support group for multiple myeloma patient's both here in [place] and through [place].  He has attended some meetings.  He tells me that he and [name] will be going to [place] in late February for a national meeting for myeloma patients. Several prominent physicians in the field of multiple myeloma will be participating in that symposium.  The patient denies any apparent problems from his treatment.  He no longer has hiccups with the Decadron.  He does have some shaking for a couple of days but does not seem to be causing any major problems.  PHYSICAL EXAMINATION:  General:  Mr. Petropoulos certainly looks well. There is evidence of weight gain approximately 13 pounds since his last visit here 6 weeks ago.  Weight is 199.7 pounds, height 6 feet even. Body surface area 2.15 square meters.  Blood pressure 138/74.  Other vital signs are normal.  HEENT:  There is no scleral icterus.  Mouth and pharynx are benign.  The patient has an upper plate.  He apparently has 6 or 7 lower teeth.  I have suggested he see a dentist to get clearance for Zometa.  There was no  peripheral adenopathy palpable.  Heart and lungs:  Are normal.  Abdomen:  Benign with no organomegaly or masses palpable.  The patient does not have a central catheter or port but would like a Port-A-Cath placed because of some trouble finding his veins.  The patient does have some apparent tenderness more in the right lower quadrant than actually in the groin.  Neurologic exam:  Normal. No musculoskeletal tenderness.  LABORATORY DATA:  White count 7.2, ANC 6.2, hemoglobin 12.0, hematocrit 34.7, platelets 107,000.  We had been checking CBCs every 2 weeks. Blood counts  have been acceptable.  Chemistries from today are notable for a glucose of 141, albumin 3.4.  LDH 148, BUN 25.0, creatinine 1.2. Serum protein electrophoresis on 06/10/2012 showed a possible faint restricted band.  The serum light chains were normal.  Kappa free light chain was 1.48 and kappa lambda ratio was 0.74, both of which are normal.  The urine protein to creatinine ratio was 0.04 which is normal. That was on 06/10/2012.  IMAGING STUDIES:  1. Two-view chest x-ray on 06/28/2011 shows some mild bronchitic  changes.  2. Metastatic bone survey on 07/04/2011 showed no lytic lesions in  the visualized axial or appendicular skeleton. There was mild  superior end-plate compression deformities at T12 and L3.  3. CT-guided left iliac bone marrow aspirate and core biopsy was  carried out on 07/08/2011.  4. MRI of the lumbar spine without IV contrast on 09/30/2011 showed  progressive compression fractures at L2, L3 and T12. There is  diffuse scattered osseous metastatic disease. There was lumbar  spondylosis and degenerative disk disease causing various degrees  of impingement at all levels between L2 and S1. Progressive  collapse of L3 was noted with 75% loss of vertebral body height,  whereas on the prior study of 07/04/2011 there was 20% loss of  height. There was 6 mm of bony retropulsion. There was  progressive loss of  L2 vertebral body noted with 50% loss of height  whereas formerly it was at 20% loss. There is a 50-60% loss of  vertebral body height noted at T12 minimally increased from the  prior study with 2 mm of bony retropulsion. 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  5. Chest x-ray, 2 view, from 01/06/2012 carried out at [facility] showed  increased interstitial opacities, possibly representing edema or  infection.  6. MRI of the right hip carried out at [facility] on 04/07/2012 and compared with  the prior MRI of the right hip on 11/19/2011 showed interval development  of a linear area of abnormal signal seen through the site of previous  soft tissue mass within the right superior pubic rami. Findings were  felt to represent a pathologic fracture most likely due to the  underlying tumor involvement and/or an element of underlying radiation  change. There was also re-demonstration of myelomatous metastatic  lesions seen within the right hemipelvis and right proximal femur.  Findings appear to be similar to the previous study. There was interval  improvement of the previously visualized soft tissue mass involving the  superior and inferior pubic rami on the right. There continued to be  mild cortical expansion and destruction within this region.    IMPRESSION AND PLAN:  Mr. Schwandt seems to be doing well.  I believe that he previously had some elevation of his kappa light chains.  On 06/10/2012 the light chains seemed to be normal.  The serum protein electrophoresis did not detect a monoclonal protein.  We are checking serum immunofixation electrophoresis today.  This should be more sensitive.  Clinically, the patient is doing well.  He continues to have some discomfort in his right lower abdomen, possibly related to his hip disease.  The patient tells me that he will be seeing Dr. Barbaraann Boys on 08/04/2012.  I have asked him to see a dentist and get dental clearance for Zometa.   It would be my plan to treat him with Zometa 4 mg IV about every 2 months for the next 2 years.  We will  need some direction regarding immunizations.  So far, the patient is out from his transplant 8 months. I believe he will need immunizations at around the 1 year mark.  He did have a flu shot in mid September 2013.  Mr. Michael will continue on the same program of Revlimid 15 mg daily, 3 weeks out of every 4.  He has 1 more week of treatment on the current cycle.  He will continue with Decadron 40 mg p.o. weekly.  We will plan to check a CBC in about 3 weeks which should be around January 16th.  We will plan to see Mr. Marrs again in 6 weeks which should be around February 6th at which time we will check CBC, chemistries, including LDH, another serum protein electrophoresis, quantitative immunoglobulins and serum immunofixation electrophoresis along with serum light chains.  At some point, we may want to repeat a 24-hour urine collection.  It looks like we had done a 24-hour urine collection back on 03/02/2012.  There were 343 mg of protein at that time.  Mr. Mayabb wishes to have a Port-A-Cath because of difficulties drawing his blood. This will be arranged with interventional radiology.  ______________________________ Samul Dada, M.D. DSM/MEDQ  D:  07/21/2012  T:  07/22/2012  Job:  [id number]

## 2012-07-25 ENCOUNTER — Other Ambulatory Visit: Payer: Self-pay | Admitting: Oncology

## 2012-07-25 ENCOUNTER — Other Ambulatory Visit: Payer: Self-pay

## 2012-07-25 DIAGNOSIS — C9000 Multiple myeloma not having achieved remission: Secondary | ICD-10-CM

## 2012-07-25 LAB — SPEP & IFE WITH QIG
Albumin ELP: 61.3 % (ref 55.8–66.1)
Alpha-2-Globulin: 11.8 % (ref 7.1–11.8)
Beta 2: 4.3 % (ref 3.2–6.5)
Beta Globulin: 6.4 % (ref 4.7–7.2)
IgA: 68 mg/dL (ref 68–379)
IgG (Immunoglobin G), Serum: 863 mg/dL (ref 650–1600)

## 2012-07-25 MED ORDER — LENALIDOMIDE 15 MG PO CAPS: 15.0000 mg | ORAL_CAPSULE | Freq: Every day | ORAL | Status: DC

## 2012-07-25 NOTE — Telephone Encounter (Signed)
Pt called that [facility] did not receive the protonix order, noted Amy Rn in triage is working on the Rx. Told amy to make sure it goes through since I tried at last pt visit. Informed pt of the same

## 2012-07-26 ENCOUNTER — Other Ambulatory Visit: Payer: Self-pay | Admitting: Radiology

## 2012-07-29 ENCOUNTER — Other Ambulatory Visit: Payer: Self-pay | Admitting: Oncology

## 2012-07-29 ENCOUNTER — Ambulatory Visit (HOSPITAL_COMMUNITY)
Admission: RE | Admit: 2012-07-29 | Discharge: 2012-07-29 | Disposition: A | Discharge status: Home / Self Care | Payer: Medicare Other | Source: Ambulatory Visit | Attending: Oncology | Admitting: Oncology

## 2012-07-29 DIAGNOSIS — C9000 Multiple myeloma not having achieved remission: Secondary | ICD-10-CM | POA: Insufficient documentation

## 2012-07-29 DIAGNOSIS — C61 Malignant neoplasm of prostate: Secondary | ICD-10-CM

## 2012-07-29 LAB — CBC
Hemoglobin: 11.8 g/dL — ABNORMAL LOW (ref 13.0–17.0)
MCV: 100.3 fL — ABNORMAL HIGH (ref 78.0–100.0)
Platelets: 166 10*3/uL (ref 150–400)
RBC: 3.43 MIL/uL — ABNORMAL LOW (ref 4.22–5.81)
WBC: 7.6 10*3/uL (ref 4.0–10.5)

## 2012-07-29 LAB — PROTIME-INR: Prothrombin Time: 12.4 seconds (ref 11.6–15.2)

## 2012-07-29 MED ORDER — MIDAZOLAM HCL 2 MG/2ML IJ SOLN
INTRAMUSCULAR | Status: AC | PRN
  Administered 2012-07-29: 2 mg via INTRAVENOUS

## 2012-07-29 MED ORDER — FENTANYL CITRATE 0.05 MG/ML IJ SOLN
INTRAMUSCULAR | Status: AC | PRN
  Administered 2012-07-29: 50 ug via INTRAVENOUS
  Administered 2012-07-29: 100 ug via INTRAVENOUS

## 2012-07-29 MED ORDER — HEPARIN SOD (PORK) LOCK FLUSH 100 UNIT/ML IV SOLN
500.0000 [IU] | Freq: Once | INTRAVENOUS | Status: AC
  Administered 2012-07-29: 500 [IU] via INTRAVENOUS

## 2012-07-29 MED ORDER — LIDOCAINE HCL 1 % IJ SOLN
INTRAMUSCULAR | Status: AC
  Filled 2012-07-29: qty 20

## 2012-07-29 MED ORDER — CEFAZOLIN SODIUM-DEXTROSE 2-3 GM-% IV SOLR
2.0000 g | INTRAVENOUS | Status: AC
  Administered 2012-07-29: 2 g via INTRAVENOUS

## 2012-07-29 MED ORDER — SODIUM CHLORIDE 0.9 % IV SOLN: INTRAVENOUS | Status: DC

## 2012-07-29 MED ORDER — CEFAZOLIN SODIUM-DEXTROSE 2-3 GM-% IV SOLR
INTRAVENOUS | Status: AC
  Filled 2012-07-29: qty 50

## 2012-07-29 MED ORDER — MIDAZOLAM HCL 2 MG/2ML IJ SOLN
INTRAMUSCULAR | Status: AC
  Filled 2012-07-29: qty 4

## 2012-07-29 MED ORDER — FENTANYL CITRATE 0.05 MG/ML IJ SOLN
INTRAMUSCULAR | Status: AC
  Filled 2012-07-29: qty 4

## 2012-07-29 NOTE — H&P (Signed)
Agree 

## 2012-07-29 NOTE — H&P (Signed)
[name] is an 67 y.o. male.   Chief Complaint:" I'm here for a port a cath" HPI: Patient with multiple myeloma and remote history of prostate cancer presents today for port a cath placement for chemotherapy/poor venous access.  Past Medical History  Diagnosis Date  . HTN (hypertension)   . Allergic rhinitis   . Hyperlipemia   . Hypercalcemia of malignancy   . Multiple myeloma(203.0) 07/09/2011  . Myeloma kidney   . Peripheral neuropathy     toes, sees Dr Terrace Arabia  . Multiple myeloma(203.0)   . Fractures     compression T12,L3  . Anemia 07/2010    secondary to tx rsponsive to Aranesp  . Prostate cancer 2008    s/p prostatectomy  . Prostate cancer 03/2002  . Renal insufficiency   . History of chemotherapy     weekly Velcade,Cytoxan  . 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 DDD (degenerative disc disease)     Past Surgical History  Procedure Date  . Kidney stone surgery   . Kidney stone surgery 06/2009    Stone removed from bladder  . Dexa 05/2011    spine 0.7, L femur 0.0, R femur -0.6, normal  . Prostatectomy 04/06/07  . Portacath placement 08/05/11    right sided portacath    Family History  Problem Relation Age of Onset  . Pneumonia Mother   . Stroke Father   . Cancer Brother     lung  . Kidney cancer Sister    Social History:  reports that he quit smoking about 16 years ago. He does not have any smokeless tobacco history on file. He reports that he drinks alcohol. He reports that he does not use illicit drugs.  Allergies:  Allergies  Allergen Reactions  . Celebrex (Celecoxib) Nausea Only  . Meloxicam Itching  . Percocet (Oxycodone-Acetaminophen) Itching  . Vicodin (Hydrocodone-Acetaminophen) Itching   Current outpatient prescriptions:acyclovir (ZOVIRAX) 400 MG tablet, Take 1 tablet (400 mg total) by mouth 2 (two) times daily., Disp: 180 tablet, Rfl: 3;  allopurinol (ZYLOPRIM) 100 MG tablet, Take 100 mg by mouth  daily. , Disp: , Rfl: ;  aspirin 81 MG tablet, Take 81 mg by mouth daily., Disp: , Rfl: ;  dexamethasone (DECADRON) 4 MG tablet, Take 10 tablets = 40 mg once a week, Disp: 40 tablet, Rfl: 3 gabapentin (NEURONTIN) 300 MG capsule, Take 1 capsule (300 mg total) by mouth 4 (four) times daily., Disp: 360 capsule, Rfl: 2;  lenalidomide (REVLIMID) 15 MG capsule, Take 1 capsule (15 mg total) by mouth daily. Take for 21 days then rest for 7 days, Disp: 21 capsule, Rfl: 0;  oxyCODONE (OXY IR/ROXICODONE) 5 MG immediate release tablet, Take 1 tablet (5 mg total) by mouth every 6 (six) hours as needed for pain., Disp: 90 tablet, Rfl: 0 oxyCODONE (OXYCONTIN) 10 MG 12 hr tablet, Take 1 tablet (10 mg total) by mouth every 12 (twelve) hours as needed., Disp: 120 tablet, Rfl: 0;  pantoprazole (PROTONIX) 40 MG tablet, Take 1 tablet by mouth  daily, Disp: 90 tablet, Rfl: 1;  prochlorperazine (COMPAZINE) 10 MG tablet, Take 10 mg by mouth every 6 (six) hours as needed., Disp: , Rfl: ;  zolpidem (AMBIEN) 5 MG tablet, Take 5 mg by mouth at bedtime as needed. For sleep, Disp: , Rfl:  Current facility-administered medications:0.9 %  sodium chloride infusion, , Intravenous, Continuous, D Kevin Kingslee Dowse, PA;  ceFAZolin (ANCEF) 2-3 GM-% IVPB SOLR, , , , ;  ceFAZolin (ANCEF) IVPB 2 g/50 mL premix, 2 g, Intravenous, On Call, D Kevin Mercedes Valeriano, PA;  fentaNYL (SUBLIMAZE) 0.05 MG/ML injection, , , , ;  midazolam (VERSED) 2 MG/2ML injection, , , ,   Results for orders placed in visit on 07/21/12  CBC WITH DIFFERENTIAL      Component Value Range   WBC 7.2  4.0 - 10.3 10e3/uL   NEUT# 6.2  1.5 - 6.5 10e3/uL   HGB 12.0 (*) 13.0 - 17.1 g/dL   HCT 16.1 (*) 09.6 - 04.5 %   Platelets 107 (*) 140 - 400 10e3/uL   MCV 104.6 (*) 79.3 - 98.0 fL   MCH 36.2 (*) 27.2 - 33.4 pg   MCHC 34.6  32.0 - 36.0 g/dL   RBC 4.09 (*) 8.11 - 9.14 10e6/uL   RDW 17.0 (*) 11.0 - 14.6 %   lymph# 0.4 (*) 0.9 - 3.3 10e3/uL   MONO# 0.5  0.1 - 0.9 10e3/uL   Eosinophils  Absolute 0.0  0.0 - 0.5 10e3/uL   Basophils Absolute 0.0  0.0 - 0.1 10e3/uL   NEUT% 86.1 (*) 39.0 - 75.0 %   LYMPH% 5.8 (*) 14.0 - 49.0 %   MONO% 7.6  0.0 - 14.0 %   EOS% 0.0  0.0 - 7.0 %   BASO% 0.5  0.0 - 2.0 %  KAPPA/LAMBDA LIGHT CHAINS      Component Value Range   Kappa free light chain 1.51  0.33 - 1.94 mg/dL   Lambda Free Lght Chn 2.06  0.57 - 2.63 mg/dL   Kappa:Lambda Ratio 7.82  0.26 - 1.65  COMPREHENSIVE METABOLIC PANEL (CC13)      Component Value Range   Sodium 140  136 - 145 mEq/L   Potassium 3.7  3.5 - 5.1 mEq/L   Chloride 106  98 - 107 mEq/L   CO2 22  22 - 29 mEq/L   Glucose 141 (*) 70 - 99 mg/dl   BUN 95.6  7.0 - 21.3 mg/dL   Creatinine 1.2  0.7 - 1.3 mg/dL   Total Bilirubin 0.86  0.20 - 1.20 mg/dL   Alkaline Phosphatase 127  40 - 150 U/L   AST 13  5 - 34 U/L   ALT 23  0 - 55 U/L   Total Protein 6.1 (*) 6.4 - 8.3 g/dL   Albumin 3.4 (*) 3.5 - 5.0 g/dL   Calcium 9.2  8.4 - 57.8 mg/dL  SPEP & IFE WITH QIG      Component Value Range   IgG (Immunoglobin G), Serum 863  650 - 1600 mg/dL   IgA 68  68 - 469 mg/dL   IgM, Serum 37 (*) 41 - 251 mg/dL   Immunofix Electr Int .     Total Protein, serum electrophor 6.1  6.0 - 8.3 g/dL   Albumin ELP 62.9  52.8 - 66.1 %   Alpha-1-Globulin 4.8  2.9 - 4.9 %   Alpha-2-Globulin 11.8  7.1 - 11.8 %   Beta Globulin 6.4  4.7 - 7.2 %   Beta 2 4.3  3.2 - 6.5 %   Gamma Globulin 11.4  11.1 - 18.8 %   M-Spike, % NOT DET     SPE Interp. *     COMMENT (PROTEIN ELECTROPHOR) *    LACTATE DEHYDROGENASE (CC13)      Component Value Range   LDH 148  125 - 245 U/L   07/29/12 labs pending Review of Systems  Constitutional: Negative for fever and chills.  Respiratory: Negative for cough and shortness of breath.   Cardiovascular: Negative for chest pain.  Gastrointestinal: Negative for nausea, vomiting and abdominal pain.  Musculoskeletal: Positive for back pain.  Neurological: Negative for headaches.  Endo/Heme/Allergies: Does not  bruise/bleed easily.   Filed Vitals:   07/29/12 0757  BP: 134/80  Pulse: 69  Temp: 97.5 F (36.4 C)  TempSrc: Oral  Resp: 21  SpO2: 95%    Physical Exam  Constitutional: He is oriented to person, place, and time. He appears well-developed and well-nourished.  Cardiovascular: Normal rate and regular rhythm.   Respiratory: Effort normal and breath sounds normal.  GI: Soft. Bowel sounds are normal.  Musculoskeletal: Normal range of motion. He exhibits no edema.  Neurological: He is alert and oriented to person, place, and time.     Assessment/Plan Pt with hx multiple myeloma. Plan is for port a cath placement for chemotherapy/poor venous access. Details/risks of procedure d/w pt with his understanding and consent.  Azlee Monforte,D KEVIN 07/29/2012, 7:56 AM

## 2012-07-29 NOTE — Procedures (Signed)
Procedure:  Left IJ port Access:  Left IJ vein SL PAC placed with cath tip at cavoatrial junction.  No PTX.  OK to use.

## 2012-08-08 ENCOUNTER — Telehealth: Payer: Self-pay

## 2012-08-08 NOTE — Telephone Encounter (Signed)
Pt called stating he is getting assistance to pay for his revlimid. He wants to get a part time job. He is wondering if there is a ceiling of how much money he can make and the assistance will be reduced or eliminated. I s/w Lenise in financial and she said to call Lauren SW. I left a voice message for lauren.

## 2012-08-09 ENCOUNTER — Encounter: Payer: Self-pay | Admitting: Oncology

## 2012-08-09 NOTE — Progress Notes (Signed)
The patient was seen at [facility] on 08/04/2012. Recommendations were for monthly Zometa and to decrease the Decadron from 40 mg to 20 mg weekly.  Return appointment will be for 3 months.

## 2012-08-10 ENCOUNTER — Encounter: Payer: Self-pay | Admitting: Oncology

## 2012-08-11 ENCOUNTER — Ambulatory Visit (HOSPITAL_BASED_OUTPATIENT_CLINIC_OR_DEPARTMENT_OTHER): Payer: Medicare Other

## 2012-08-11 ENCOUNTER — Other Ambulatory Visit (HOSPITAL_BASED_OUTPATIENT_CLINIC_OR_DEPARTMENT_OTHER): Payer: Medicare Other | Admitting: Lab

## 2012-08-11 VITALS — BP 133/76 | HR 84 | Temp 96.9°F

## 2012-08-11 DIAGNOSIS — D631 Anemia in chronic kidney disease: Secondary | ICD-10-CM

## 2012-08-11 DIAGNOSIS — C9000 Multiple myeloma not having achieved remission: Secondary | ICD-10-CM

## 2012-08-11 LAB — CBC WITH DIFFERENTIAL/PLATELET
Eosinophils Absolute: 0 10*3/uL (ref 0.0–0.5)
HGB: 11.5 g/dL — ABNORMAL LOW (ref 13.0–17.1)
MONO#: 0.3 10*3/uL (ref 0.1–0.9)
NEUT#: 7.1 10*3/uL — ABNORMAL HIGH (ref 1.5–6.5)
Platelets: 119 10*3/uL — ABNORMAL LOW (ref 140–400)
RBC: 3.13 10*6/uL — ABNORMAL LOW (ref 4.20–5.82)
RDW: 18.1 % — ABNORMAL HIGH (ref 11.0–14.6)
WBC: 8 10*3/uL (ref 4.0–10.3)

## 2012-08-11 MED ORDER — HEPARIN SOD (PORK) LOCK FLUSH 100 UNIT/ML IV SOLN
500.0000 [IU] | Freq: Once | INTRAVENOUS | Status: AC
  Administered 2012-08-11: 500 [IU] via INTRAVENOUS
  Filled 2012-08-11: qty 5

## 2012-08-11 MED ORDER — SODIUM CHLORIDE 0.9 % IJ SOLN
10.0000 mL | INTRAMUSCULAR | Status: DC | PRN
  Administered 2012-08-11: 10 mL via INTRAVENOUS
  Filled 2012-08-11: qty 10

## 2012-08-11 NOTE — Patient Instructions (Signed)
Call MD for problems 

## 2012-08-15 ENCOUNTER — Other Ambulatory Visit: Payer: Self-pay

## 2012-08-15 DIAGNOSIS — C9000 Multiple myeloma not having achieved remission: Secondary | ICD-10-CM

## 2012-08-15 MED ORDER — ALLOPURINOL 100 MG PO TABS: 100.0000 mg | ORAL_TABLET | Freq: Every day | ORAL | Status: DC

## 2012-08-23 ENCOUNTER — Other Ambulatory Visit: Payer: Self-pay | Admitting: *Deleted

## 2012-08-23 DIAGNOSIS — C9000 Multiple myeloma not having achieved remission: Secondary | ICD-10-CM

## 2012-08-23 NOTE — Telephone Encounter (Signed)
THIS REFILL REQUEST FOR REVLIMID WAS GIVEN TO DR.MURINSON'S NURSE, ROBIN BASS,RN. 

## 2012-08-26 IMAGING — CT CT BIOPSY
1 of 6 series · 13 of 32 positions shown, 19 images · non-contrast
Comparison: none

CLINICAL DATA: Thrombocytopenia, anemia, history of prostate
cancer, possible multiple myeloma

[Series 2: pelvis 5.0 b31f · axial · 0.88mm/px · z∈[-188,-68]mm · 13 of 29 slices shown, 19 images]
[im 3/29  soft-tissue]
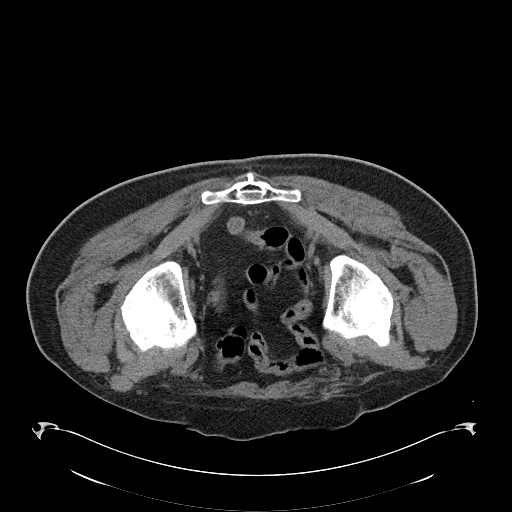
[im 3/29  bone]
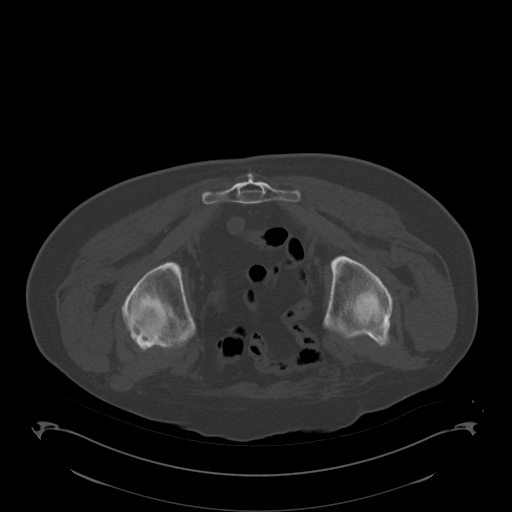
[im 5/29  soft-tissue]
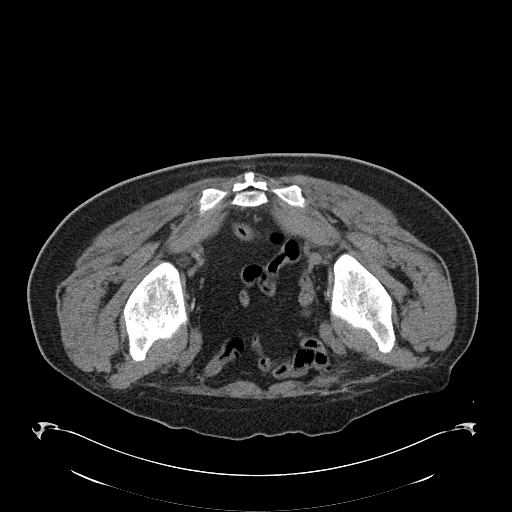
[im 7/29  soft-tissue]
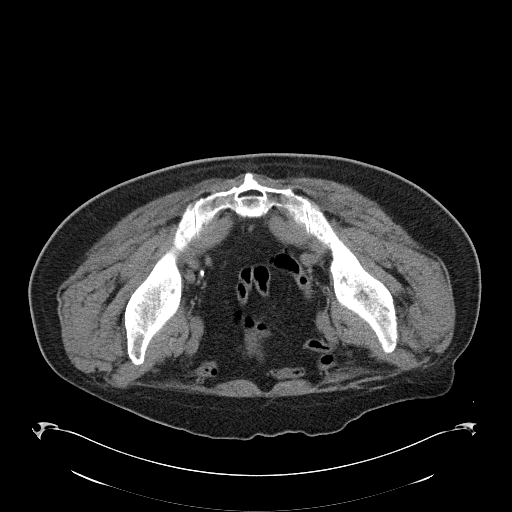
[im 9/29  soft-tissue]
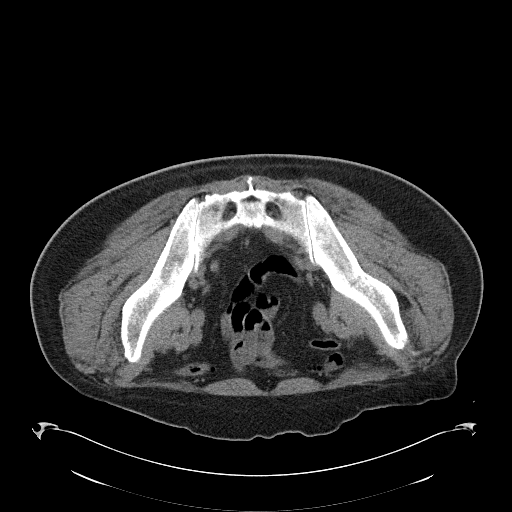
[im 11/29  soft-tissue]
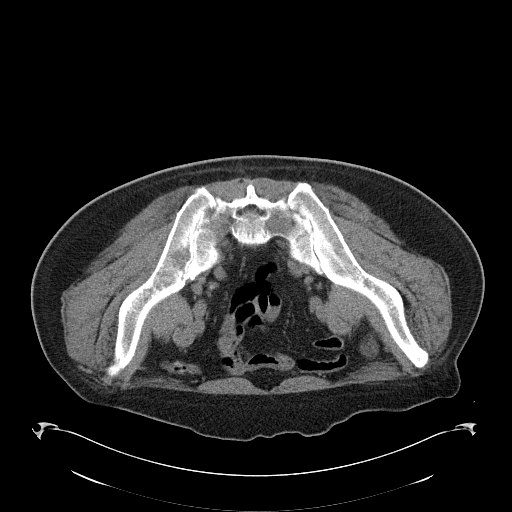
[im 13/29  soft-tissue]
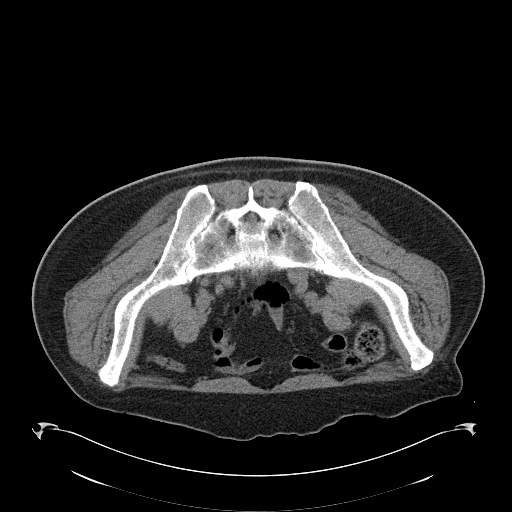
[im 15/29  soft-tissue]
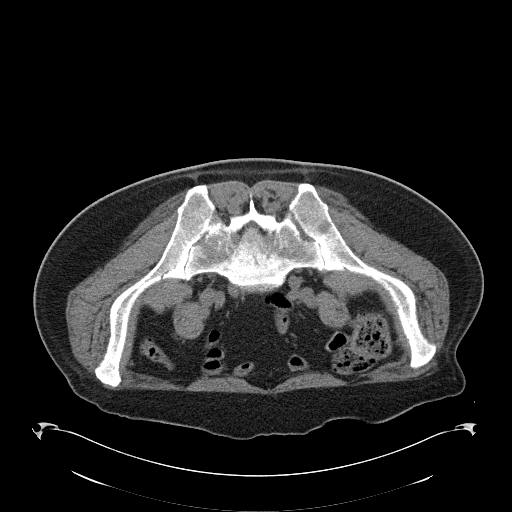
[im 17/29  soft-tissue]
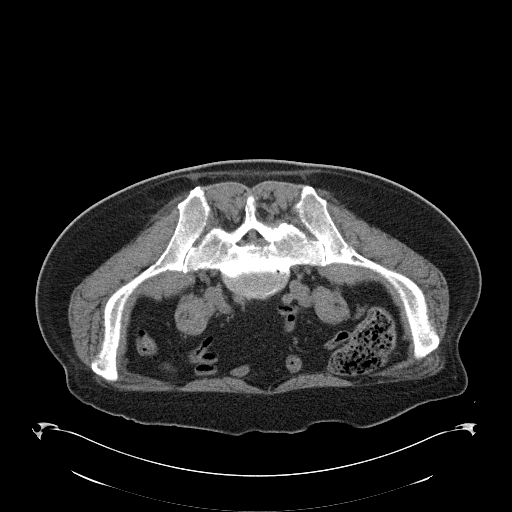
[im 19/29  soft-tissue]
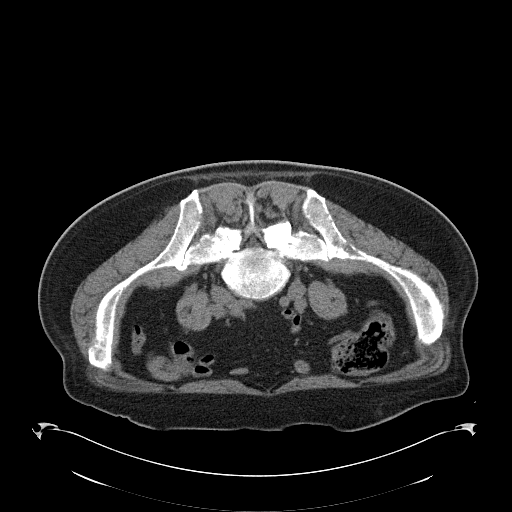
[im 19/29  bone]
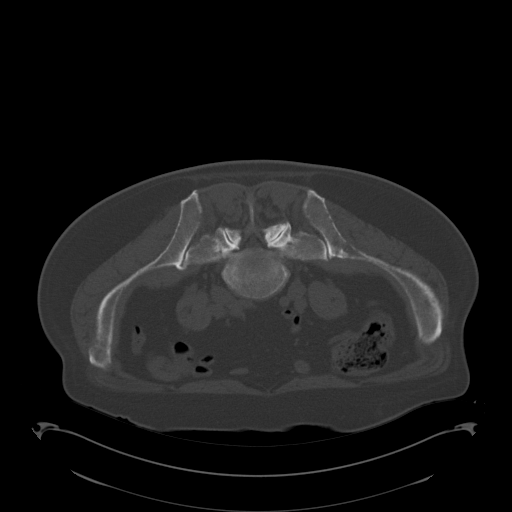
[im 21/29  soft-tissue]
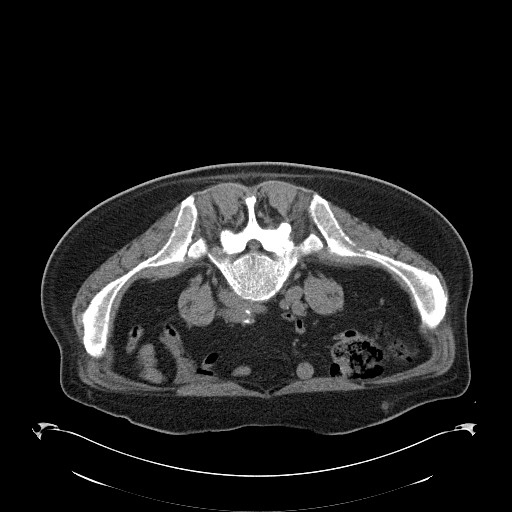
[im 21/29  lung]
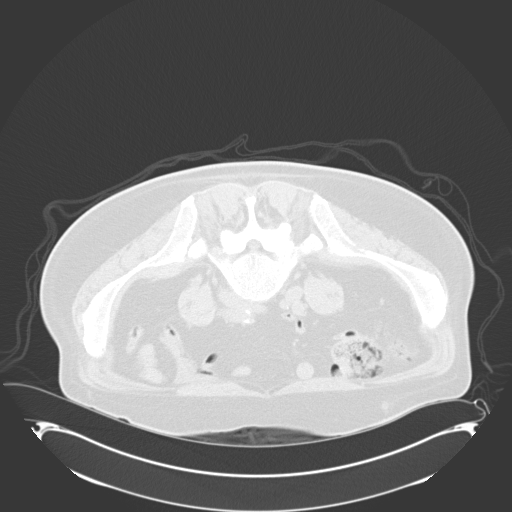
[im 23/29  soft-tissue]
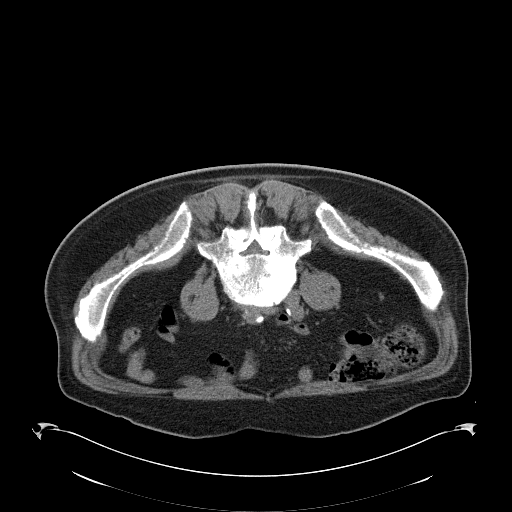
[im 23/29  lung]
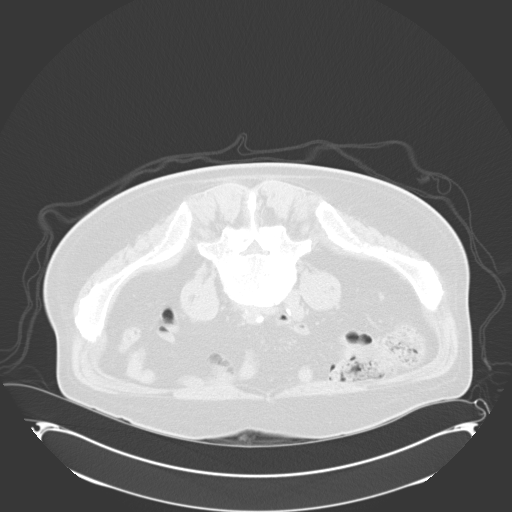
[im 25/29  soft-tissue]
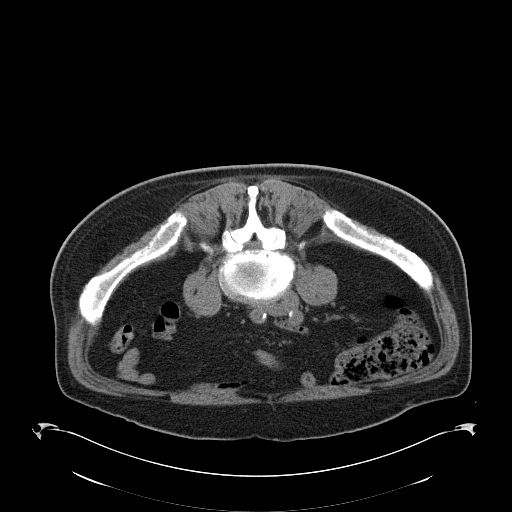
[im 25/29  lung]
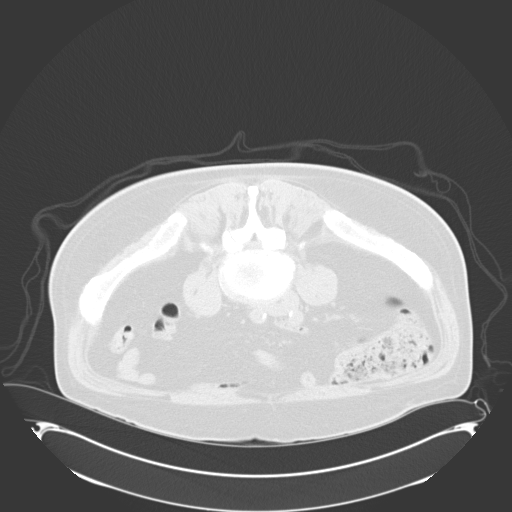
[im 27/29  soft-tissue]
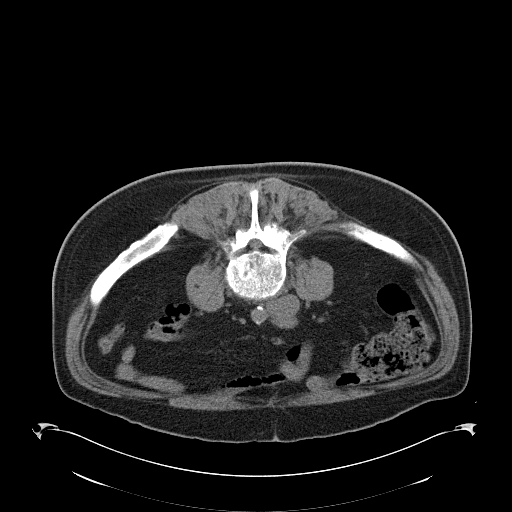
[im 27/29  lung]
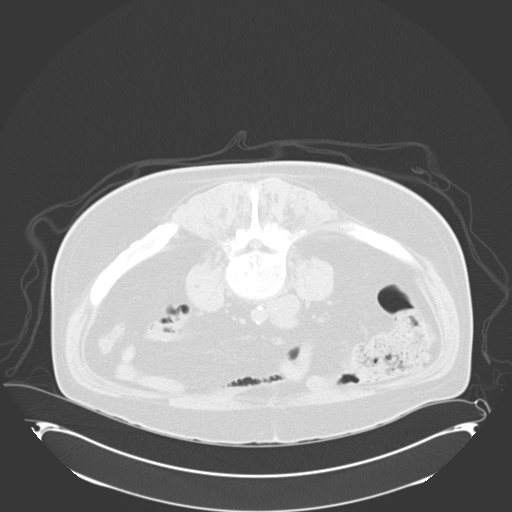

[13 of 32 positions shown; findings below may reference images not displayed]

CT GUIDED LEFT ILIAC BONE MARROW ASPIRATION AND CORE BIOPSY

Date:  07/08/2011 [DATE]

Radiologist:  Gemini Zatarain, M.D.

Medications:  75 micrograms Fentanyl, 1 mg Versed

Guidance:  CT

Sedation time:  15 minutes

Contrast volume:   none.

Complications:  No immediate.

PROCEDURE/FINDINGS:

Informed consent was obtained from the patient following
explanation of the procedure, risks, benefits and alternatives.
The patient understands, agrees and consents for the procedure.
All questions were addressed.  A time out was performed.

The patient was positioned prone and noncontrast localization CT
was performed of the pelvis to demonstrate the iliac marrow spaces.

Maximal barrier sterile technique utilized including caps, mask,
sterile gowns, sterile gloves, large sterile drape, hand hygiene,
and betadine prep.

Under sterile conditions and local anesthesia, an 11 gauge coaxial
bone biopsy needle was advanced into the left iliac marrow space.
Needle position was confirmed with CT imaging. Initially, bone
marrow aspiration was performed. Next, the 11 gauge outer cannula
was utilized to obtain a left iliac bone marrow core biopsy. Needle
was removed. Hemostasis was obtained with compression. The patient
tolerated the procedure well. Samples were prepared with the
cytotechnologist. No immediate complications.
IMPRESSION: CT guided left iliac bone marrow aspiration and core biopsy.

## 2012-08-26 MED ORDER — LENALIDOMIDE 15 MG PO CAPS: 15.0000 mg | ORAL_CAPSULE | Freq: Every day | ORAL | Status: DC

## 2012-08-26 NOTE — Addendum Note (Signed)
Addended by: Arvilla Meres on: 08/26/2012 01:56 PM   Modules accepted: Orders

## 2012-08-30 NOTE — Telephone Encounter (Signed)
RECEIVED A FAX FROM [facility] CONCERNING A CONFIRMATION OF PRESCRIPTION SHIPMENT FOR REVLIMID ON 08/29/12. 

## 2012-09-01 ENCOUNTER — Other Ambulatory Visit (HOSPITAL_BASED_OUTPATIENT_CLINIC_OR_DEPARTMENT_OTHER): Payer: Medicare Other | Admitting: Lab

## 2012-09-01 ENCOUNTER — Other Ambulatory Visit: Payer: Self-pay | Admitting: Medical Oncology

## 2012-09-01 ENCOUNTER — Telehealth: Payer: Self-pay | Admitting: Oncology

## 2012-09-01 ENCOUNTER — Ambulatory Visit: Payer: Medicare Other | Admitting: Lab

## 2012-09-01 ENCOUNTER — Ambulatory Visit (HOSPITAL_BASED_OUTPATIENT_CLINIC_OR_DEPARTMENT_OTHER): Payer: Medicare Other | Admitting: Oncology

## 2012-09-01 ENCOUNTER — Ambulatory Visit: Payer: Medicare Other

## 2012-09-01 ENCOUNTER — Encounter: Payer: Self-pay | Admitting: Oncology

## 2012-09-01 VITALS — BP 140/79 | HR 52 | Temp 96.8°F | Resp 18 | Ht 72.0 in | Wt 199.6 lb

## 2012-09-01 DIAGNOSIS — N289 Disorder of kidney and ureter, unspecified: Secondary | ICD-10-CM

## 2012-09-01 DIAGNOSIS — C9000 Multiple myeloma not having achieved remission: Secondary | ICD-10-CM

## 2012-09-01 DIAGNOSIS — I10 Essential (primary) hypertension: Secondary | ICD-10-CM

## 2012-09-01 DIAGNOSIS — D649 Anemia, unspecified: Secondary | ICD-10-CM

## 2012-09-01 DIAGNOSIS — C801 Malignant (primary) neoplasm, unspecified: Secondary | ICD-10-CM

## 2012-09-01 LAB — CBC WITH DIFFERENTIAL/PLATELET
Basophils Absolute: 0 10*3/uL (ref 0.0–0.1)
Eosinophils Absolute: 0 10*3/uL (ref 0.0–0.5)
HCT: 33 % — ABNORMAL LOW (ref 38.4–49.9)
LYMPH%: 10 % — ABNORMAL LOW (ref 14.0–49.0)
MONO#: 0.5 10*3/uL (ref 0.1–0.9)
NEUT#: 4.4 10*3/uL (ref 1.5–6.5)
NEUT%: 80.1 % — ABNORMAL HIGH (ref 39.0–75.0)
Platelets: 137 10*3/uL — ABNORMAL LOW (ref 140–400)
WBC: 5.4 10*3/uL (ref 4.0–10.3)

## 2012-09-01 LAB — COMPREHENSIVE METABOLIC PANEL (CC13)
Alkaline Phosphatase: 100 U/L (ref 40–150)
Glucose: 163 mg/dl — ABNORMAL HIGH (ref 70–99)
Sodium: 137 mEq/L (ref 136–145)
Total Bilirubin: 0.5 mg/dL (ref 0.20–1.20)
Total Protein: 6.7 g/dL (ref 6.4–8.3)

## 2012-09-01 LAB — LACTATE DEHYDROGENASE (CC13): LDH: 204 U/L (ref 125–245)

## 2012-09-01 MED ORDER — SODIUM CHLORIDE 0.9 % IJ SOLN
10.0000 mL | INTRAMUSCULAR | Status: DC | PRN
  Administered 2012-09-01: 10 mL via INTRAVENOUS
  Filled 2012-09-01: qty 10

## 2012-09-01 MED ORDER — HEPARIN SOD (PORK) LOCK FLUSH 100 UNIT/ML IV SOLN
500.0000 [IU] | Freq: Once | INTRAVENOUS | Status: AC
  Administered 2012-09-01: 500 [IU] via INTRAVENOUS
  Filled 2012-09-01: qty 5

## 2012-09-01 MED ORDER — OXYCODONE HCL 5 MG PO TABS: 5.0000 mg | ORAL_TABLET | Freq: Four times a day (QID) | ORAL | Status: DC | PRN

## 2012-09-01 NOTE — Telephone Encounter (Signed)
per Mrs. [name] pt needed flush appt.Marland KitchenMarland KitchenDone

## 2012-09-01 NOTE — Progress Notes (Signed)
CC:   [name], MD [name], M.D. [name], MD [name], MD [name], M.D. [name], M.D.   PROBLEM LIST:  1. Kappa light chain multiple myeloma, ISS III with diagnosis established in December 2012 when the patient presented with marked anemia, thrombocytopenia and extensive bone marrow replacement with immature plasma cells/plasma blasts and circulating plasma cells in the peripheral blood. Bone marrow was carried out on 07/08/2011. In addition, the patient had severe renal insufficiency and hypercalcemia. He was requiring transfusions of platelets and red  cells. He appeared to have limited bone disease characterized by mild compression fractures of T12 and L3. A bone density scan done a couple months preceding the diagnosis had been normal. Results of cytogenetic analysis revealed the presence of 2 clonal cell lines. The 1st cell line was chromosomally normal (20% of cells). 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 There was a gain of chromosome 7 and a marker chromosome. 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 FISH studies showed loss of chromosome 4, loss of chromosome 14, loss of chromosome 13, and loss of chromosome 17, i.e. 4 P-, 14 Q-, 13 Q- and 17 P-. These findings would be consistent with abnormalities associated with multiple myeloma. The patient received Velcade, Cytoxan, and Decadron on a weekly basis from 07/10/2011 through 10/23/2011. The patient developed pain in the region of his right hip during the month of 12-03-22. An MRI of the right hip at [facility] on 11/19/2011 showed a destructive lesion of the right pubic body and adjacent rami with associated soft tissue component. There was marked heterogeneous abnormal marrow signal throughout the visualized marrow. The patient also underwent an MRI of the lumbar  spine with and without IV contrast on 11/19/2011. Multiple compression deformities were noted, most severely at L3, where there was slight retropulsion resulting in moderate canal stenosis. An MRI of the right knee on 12/01/2011 showed no evidence of multiple myeloma; however, there was a horizontal medial meniscus tear involving the body and posterior horn and degenerative changes. These studies were carried out at [facility]. The patient underwent radiation treatments to the right hemipelvis. He received apparently 10 treatments at [facility] with some improvement in pain.  Mr. Grizzell then underwent high-dose chemotherapy on 11/23/2011, consisting of carmustine 600 mg IV. He received cytarabine 400 mg IV on  12/24/2011, 12/25/2011, 12/26/2011, and 12/27/2011. He received etoposide 300 mg IV daily from 12/24/2011 through 12/27/2011, four doses. He received melphalan 280 mg IV on 12/28/2011. He was scheduled to receive autologous stem cell reinfusion on 12/29/2011. The patient's initial hospitalization was from 12/23/2011 through 12/28/2011. He required readmission at [facility] from 01/06/2012 through 01/10/2012. He had developed fever, shaking chills, diarrhea, weakness, and hypotension. One out of 2 blood cultures grew out gram-positive cocci, felt to be staphylococcus from his Hickman catheter. He was treated with ceftazidime and vancomycin. Vancomycin was discontinued after a 10-day course on 01/15/2012.  The patient was found to have progressive disease in late September 2013 as  evidenced by an increase in his serum kappa light chains and serum  protein M spike. It was recommended that the patient start Revlimid 15  mg daily, 3 weeks on, 1 week off, and Decadron 40 mg weekly, in  conjunction with aspirin 81 mg daily. Those treatments started on  05/11/2012.  2. Pancytopenia at diagnosis secondary  to extensive marrow replacement by multiple myeloma.  3. Renal insufficiency, markedly improved since diagnosis.  4.  Hypercalcemia at presentation, now resolved following treatment.  5. Anemia which developed in mid January 2012 secondary to treatment  responsive to Aranesp.  6. History of prostate cancer dating back to September 2003 treated  with robotic assisted laparoscopic radical prostatectomy by Dr.  [name]. The tumor was pathologic T3a, involving both lobes  of the prostate with capsular extension and positive margin  focally. The patient did not receive postoperative radiation.  7. Hypertension.  8. Dyslipidemia.  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  10. Right-sided Port-A-Cath placement on 08/05/2011. This was subsequently removed when the patient was at [facility] in June 2013.  11. Kyphoplasty carried out by Dr. [name] on 10/13/2011  involving vertebral bodies T12, L2 and L3. Review of bone biopsy pathology from  kyphoplasties done on 10/13/2011 showed scattered plasma cells with no  kappa or lambda light chain restriction.  12. Destructive lesion of the right pubic body and adjacent rami with associated soft tissue component detected on MRI of the right hip on 11/19/2011 at [facility]. The patient received 10 radiation treatments at [facility] with improvement in pain.  13. Weight loss of approximately 35 pounds from May through June 2013. The patient had regained all his weight by December 2013. 14. Left-sided Port-A-Cath was placed by Interventional Radiology on 07/29/2012.     MEDICATIONS:  Reviewed and recorded.   Current Outpatient Prescriptions  Medication Sig Dispense Refill  . acyclovir (ZOVIRAX) 400 MG tablet Take 1 tablet (400 mg total) by mouth 2 (two) times daily.  180 tablet  3  . allopurinol (ZYLOPRIM) 100 MG tablet Take 1 tablet (100 mg total) by mouth daily.  90 tablet  3  . aspirin 81 MG tablet Take 81 mg by mouth daily.      Marland Kitchen dexamethasone (DECADRON) 4 MG tablet Take 20 mg by mouth daily.      Marland Kitchen gabapentin  (NEURONTIN) 300 MG capsule Take 1 capsule (300 mg total) by mouth 4 (four) times daily.  360 capsule  2  . lenalidomide (REVLIMID) 15 MG capsule Take 1 capsule (15 mg total) by mouth daily. Take for 21 days then rest for 7 days  21 capsule  0  . oxyCODONE (OXYCONTIN) 10 MG 12 hr tablet Take 1 tablet (10 mg total) by mouth every 12 (twelve) hours as needed.  120 tablet  0  . pantoprazole (PROTONIX) 40 MG tablet Take 1 tablet by mouth  daily  90 tablet  1  . prochlorperazine (COMPAZINE) 10 MG tablet Take 10 mg by mouth every 6 (six) hours as needed.      Marland Kitchen oxyCODONE (OXY IR/ROXICODONE) 5 MG immediate release tablet Take 1 tablet (5 mg total) by mouth every 6 (six) hours as needed for pain.  90 tablet  0  . zolpidem (AMBIEN) 5 MG tablet Take 5 mg by mouth at bedtime as needed. For sleep       -Decadron, currently 20 mg weekly as of 08/10/2012.  The patient had been started on Decadron 40 mg weekly on 05/11/2012.  -Revlimid 15 mg daily, 3 weeks on, 1 week off. Revlimid was started  on 05/11/2012.  -Zometa will be started soon, to be given every 2 months.  Dental clearance has been obtained.    IMMUNIZATIONS: Pneumovax was given on 04/28/2011 at the [facility].  He had a flu shot on  04/13/2012.    SMOKING HISTORY: The patient had smoked 2 packs of cigarettes a day for  approximately 25 years but stopped smoking in 1998.     HISTORY:  I saw Manasseh Pittsley today for followup of his kappa light chain multiple myeloma diagnosed in December 2012.  Mr. Ohm was last seen by [place] on 07/21/2012.  On 07/29/2012 he had a Port-A-Cath placed because of difficulties with venous access.  On 08/04/2012 the patient was seen at [facility].  The patient was having problems with shaking and weight gain.  The dosage of the Decadron was decreased from 40 mg weekly to 20 mg weekly.  The patient takes this on Wednesdays.  The rest of his status seems to be unchanged.  He did see his dentist and  received dental clearance for Zometa.  The patient will start that sometime in the next few days, and continue that every 2 months.  His main problem is low back pain which remains stable.  He takes OxyContin 10 mg every 12 hours, also OxyIR two 5 mg tablets usually about mid day.  He is not having any right hip pain.  Rest of his review of systems is negative.  PHYSICAL EXAMINATION:  He looks well.  Weight is 205.8 pounds, up about 6 pounds from 6 weeks ago.  Height 6 feet even.  Body surface area 2.18 sq m.  Blood pressure 143/96.  Other vital signs are normal.  There is no scleral icterus.  Mouth and pharynx are benign.  He has an upper plate.  No peripheral adenopathy palpable.  Heart and lungs are normal. There is a new Port-A-Cath on the left side.  The heart and lungs are normal.  Abdomen is benign with no organomegaly or masses palpable. Extremities no peripheral edema.  Neurologic exam is normal.  LABORATORY DATA:  Today, white count 5.4, ANC 4.4, hemoglobin 11.6, hematocrit 33.0, platelets 137,000.  Chemistries from today notable for an albumin of 3.1, glucose 163, BUN 29, creatinine 1.2.  LDH was 204. Total protein was 6.7.  Pending from today are serum protein electrophoresis, serum immunofixation electrophoresis, quantitative immunoglobulins, and light chains.  On 07/21/2012 serum immunofixation electrophoresis was somewhat equivocal, suggested an IgG heavy chain and a kappa light chain.  IgG level was 863, IgA was 68 and IgM level was 37, all on 07/21/2012.  The kappa free light chain was 1.51 and the kappa lambda ratio was 0.73.  IMAGING STUDIES:  1. Two-view chest x-ray on 06/28/2011 shows some mild bronchitic  changes.  2. Metastatic bone survey on 07/04/2011 showed no lytic lesions in  the visualized axial or appendicular skeleton. There was mild  superior end-plate compression deformities at T12 and L3.  3. CT-guided left iliac bone marrow aspirate and core biopsy  was  carried out on 07/08/2011.  4. MRI of the lumbar spine without IV contrast on 09/30/2011 showed  progressive compression fractures at L2, L3 and T12. There is  diffuse scattered osseous metastatic disease. There was lumbar  spondylosis and degenerative disk disease causing various degrees  of impingement at all levels between L2 and S1. Progressive  collapse of L3 was noted with 75% loss of vertebral body height,  whereas on the prior study of 07/04/2011 there was 20% loss of  height. There was 6 mm of bony retropulsion. There was  progressive loss of L2 vertebral body noted with 50% loss of height  whereas formerly it was at 20% loss. There is a 50-60% loss of  vertebral body height noted at T12 minimally increased from the  prior study with 2 mm of bony retropulsion. 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  5. Chest x-ray, 2 view, from 01/06/2012 carried out at [facility] showed  increased interstitial opacities, possibly representing edema or  infection.   6. MRI of the right hip carried out at [facility] on 04/07/2012 and compared with  the prior MRI of the right hip on 11/19/2011 showed interval development  of a linear area of abnormal signal seen through the site of previous  soft tissue mass within the right superior pubic rami. Findings were  felt to represent a pathologic fracture most likely due to the  underlying tumor involvement and/or an element of underlying radiation  change. There was also re-demonstration of myelomatous metastatic  lesions seen within the right hemipelvis and right proximal femur.  Findings appear to be similar to the previous study. There was interval  improvement of the previously visualized soft tissue mass involving the  superior and inferior pubic rami on the right. There continued to be  mild cortical expansion and destruction within this region.   7. Port-A-Cath placement with ultrasound and fluoroscopic guidance  was carried out on 07/29/2012 by Interventional Radiology.  The report indicates that the catheter was placed on the right side through the right internal jugular vein.  However, I believe, the Port-A-Cath is actually situated on the left side.   IMPRESSION AND PLAN:  Mr. Hobbins continues to do well.  As stated, he was seen at [facility] on 08/04/2012.  We need to obtain that report.  The Decadron dose has been changed from 40 mg weekly to 20 mg weekly.  The patient now has a Port-A-Cath.  He has been given clearance by his dentist so we can start Zometa.  This will be arranged in the next few days.  He will receive Zometa 4 mg IV every 2 months.  Will plan to collect a 24-hour urine.  The last urine collection we have was from 03/02/2012.  We will plan to check a CBC in 1 month which will be around March 3rd and we will plan to see Mr. Hing again in 2 months, which will be around April 3rd, at which time we will check CBC, chemistries, serum immunofixation electrophoresis and quantitative immunoglobulins.  As stated, we are collecting a 24-hour urine.  The patient will also be due for Zometa at that time.  He will continue with taking Revlimid 15 mg daily 3 weeks out of every 4, i.e. 3 weeks on, 1 week off.  He will continue with Decadron 20 mg weekly.  He usually takes this on Wednesdays.    ______________________________ Samul Dada, M.D. DSM/MEDQ  D:  09/01/2012  T:  09/01/2012  Job:  [id number]

## 2012-09-01 NOTE — Progress Notes (Signed)
This office note has been dictated.  #[id number]

## 2012-09-01 NOTE — Telephone Encounter (Signed)
Per staff message and POF I have scheduled appts.  JMW  

## 2012-09-01 NOTE — Telephone Encounter (Signed)
Gave pt appt for lab, MD and chemo for February, March and APril 2014

## 2012-09-01 NOTE — Telephone Encounter (Signed)
Gave pt appt for lab, chemo and MD for February and April 2014

## 2012-09-02 ENCOUNTER — Ambulatory Visit (HOSPITAL_BASED_OUTPATIENT_CLINIC_OR_DEPARTMENT_OTHER): Payer: Medicare Other

## 2012-09-02 ENCOUNTER — Other Ambulatory Visit: Payer: Self-pay | Admitting: Medical Oncology

## 2012-09-02 ENCOUNTER — Telehealth: Payer: Self-pay | Admitting: Medical Oncology

## 2012-09-02 DIAGNOSIS — E669 Obesity, unspecified: Secondary | ICD-10-CM

## 2012-09-02 DIAGNOSIS — I10 Essential (primary) hypertension: Secondary | ICD-10-CM

## 2012-09-02 DIAGNOSIS — C61 Malignant neoplasm of prostate: Secondary | ICD-10-CM

## 2012-09-02 DIAGNOSIS — C9000 Multiple myeloma not having achieved remission: Secondary | ICD-10-CM

## 2012-09-02 DIAGNOSIS — N179 Acute kidney failure, unspecified: Secondary | ICD-10-CM

## 2012-09-02 DIAGNOSIS — H8110 Benign paroxysmal vertigo, unspecified ear: Secondary | ICD-10-CM

## 2012-09-02 DIAGNOSIS — R198 Other specified symptoms and signs involving the digestive system and abdomen: Secondary | ICD-10-CM

## 2012-09-02 DIAGNOSIS — S32000A Wedge compression fracture of unspecified lumbar vertebra, initial encounter for closed fracture: Secondary | ICD-10-CM

## 2012-09-02 DIAGNOSIS — D631 Anemia in chronic kidney disease: Secondary | ICD-10-CM

## 2012-09-02 DIAGNOSIS — C801 Malignant (primary) neoplasm, unspecified: Secondary | ICD-10-CM

## 2012-09-02 DIAGNOSIS — E781 Pure hyperglyceridemia: Secondary | ICD-10-CM

## 2012-09-02 DIAGNOSIS — D696 Thrombocytopenia, unspecified: Secondary | ICD-10-CM

## 2012-09-02 MED ORDER — ZOLEDRONIC ACID 4 MG/100ML IV SOLN
4.0000 mg | Freq: Once | INTRAVENOUS | Status: AC
  Administered 2012-09-02: 4 mg via INTRAVENOUS
  Filled 2012-09-02: qty 100

## 2012-09-02 NOTE — Patient Instructions (Addendum)
Zoledronic Acid injection (Hypercalcemia, Oncology) What is this medicine? ZOLEDRONIC ACID (ZOE le dron ik AS id) lowers the amount of calcium loss from bone. It is used to treat too much calcium in your blood from cancer. It is also used to prevent complications of cancer that has spread to the bone. This medicine may be used for other purposes; ask your health care provider or pharmacist if you have questions. What should I tell my health care provider before I take this medicine? They need to know if you have any of these conditions: -aspirin-sensitive asthma -dental disease -kidney disease -an unusual or allergic reaction to zoledronic acid, other medicines, foods, dyes, or preservatives -pregnant or trying to get pregnant -breast-feeding How should I use this medicine? This medicine is for infusion into a vein. It is given by a health care professional in a hospital or clinic setting. Talk to your pediatrician regarding the use of this medicine in children. Special care may be needed. Overdosage: If you think you have taken too much of this medicine contact a poison control center or emergency room at once. NOTE: This medicine is only for you. Do not share this medicine with others. What if I miss a dose? It is important not to miss your dose. Call your doctor or health care professional if you are unable to keep an appointment. What may interact with this medicine? -certain antibiotics given by injection -NSAIDs, medicines for pain and inflammation, like ibuprofen or naproxen -some diuretics like bumetanide, furosemide -teriparatide -thalidomide This list may not describe all possible interactions. Give your health care provider a list of all the medicines, herbs, non-prescription drugs, or dietary supplements you use. Also tell them if you smoke, drink alcohol, or use illegal drugs. Some items may interact with your medicine. What should I watch for while using this medicine? Visit  your doctor or health care professional for regular checkups. It may be some time before you see the benefit from this medicine. Do not stop taking your medicine unless your doctor tells you to. Your doctor may order blood tests or other tests to see how you are doing. Women should inform their doctor if they wish to become pregnant or think they might be pregnant. There is a potential for serious side effects to an unborn child. Talk to your health care professional or pharmacist for more information. You should make sure that you get enough calcium and vitamin D while you are taking this medicine. Discuss the foods you eat and the vitamins you take with your health care professional. Some people who take this medicine have severe bone, joint, and/or muscle pain. This medicine may also increase your risk for a broken thigh bone. Tell your doctor right away if you have pain in your upper leg or groin. Tell your doctor if you have any pain that does not go away or that gets worse. What side effects may I notice from receiving this medicine? Side effects that you should report to your doctor or health care professional as soon as possible: -allergic reactions like skin rash, itching or hives, swelling of the face, lips, or tongue -anxiety, confusion, or depression -breathing problems -changes in vision -feeling faint or lightheaded, falls -jaw burning, cramping, pain -muscle cramps, stiffness, or weakness -trouble passing urine or change in the amount of urine Side effects that usually do not require medical attention (report to your doctor or health care professional if they continue or are bothersome): -bone, joint, or muscle pain -  fever -hair loss -irritation at site where injected -loss of appetite -nausea, vomiting -stomach upset -tired This list may not describe all possible side effects. Call your doctor for medical advice about side effects. You may report side effects to FDA at  1-800-FDA-1088. Where should I keep my medicine? This drug is given in a hospital or clinic and will not be stored at home. NOTE: This sheet is a summary. It may not cover all possible information. If you have questions about this medicine, talk to your doctor, pharmacist, or health care provider.  2013, [employer]. (01/09/2011 9:06:58 AM)  

## 2012-09-02 NOTE — Telephone Encounter (Signed)
I called [facility] in radiology per Dr. Mamie Levers request to notify them that in the procedure note the port site states it is right. The pt's port is located on there left. Rebecka Apley will ask Dr. Fredia Sorrow to correct this. Dr. Arline Asp notified.

## 2012-09-05 ENCOUNTER — Other Ambulatory Visit: Payer: Self-pay | Admitting: Medical Oncology

## 2012-09-05 ENCOUNTER — Encounter: Payer: Self-pay | Admitting: Medical Oncology

## 2012-09-05 ENCOUNTER — Telehealth: Payer: Self-pay | Admitting: *Deleted

## 2012-09-05 LAB — SPEP & IFE WITH QIG
Alpha-1-Globulin: 5.7 % — ABNORMAL HIGH (ref 2.9–4.9)
Alpha-2-Globulin: 15.6 % — ABNORMAL HIGH (ref 7.1–11.8)
Total Protein, Serum Electrophoresis: 6.3 g/dL (ref 6.0–8.3)

## 2012-09-05 LAB — KAPPA/LAMBDA LIGHT CHAINS
Kappa free light chain: 1.01 mg/dL (ref 0.33–1.94)
Lambda Free Lght Chn: 1.8 mg/dL (ref 0.57–2.63)

## 2012-09-05 MED ORDER — DEXAMETHASONE 4 MG PO TABS: ORAL_TABLET | ORAL | Status: DC

## 2012-09-05 MED ORDER — LIDOCAINE-PRILOCAINE 2.5-2.5 % EX CREA: TOPICAL_CREAM | CUTANEOUS | Status: DC | PRN

## 2012-09-05 NOTE — Telephone Encounter (Signed)
Pt called and stated that after her got the zometa he has been dizzy in the mornings. He states that when he gets up he has to hold to the furniture. As the day wears on the dizziness goes away. Today it is beginning to go away but Sat and Sun it was pretty bad. I verified with pt he is not taking any blood pressure medications. I spoke with Dr. Arline Asp and he is not sure if this is from the zometa. We did look at drug information and low blood pressure can be a side effect. I asked pt if he has a blood pressure cuff and he does. He is going to check his blood pressure sitting and then after he gets up. I asked him to try and sit on the side of the bed for a few minutes and then get up. He will check his pressure and keep [place] updated. He also asked for some refills which we will handle.

## 2012-09-05 NOTE — Telephone Encounter (Signed)
Called patient for chemo follow up, he states the only issue he has had different is some dizziness upon standing this morning. However, patient does have known issues with vertigo as well as blood pressure. Denies any other problems. Encouraged patient to call if this dizziness worsens.

## 2012-09-05 NOTE — Telephone Encounter (Signed)
Message copied by Kathlynn Grate on Mon Sep 05, 2012  5:14 PM ------      Message from: Barbara Cower      Created: Fri Sep 02, 2012  3:19 PM      Regarding: pt callback f/u       1st Zometa            Dr. Arline Asp ------

## 2012-09-15 ENCOUNTER — Other Ambulatory Visit: Payer: Self-pay | Admitting: Medical Oncology

## 2012-09-15 ENCOUNTER — Other Ambulatory Visit: Payer: Self-pay | Admitting: *Deleted

## 2012-09-15 DIAGNOSIS — C9000 Multiple myeloma not having achieved remission: Secondary | ICD-10-CM

## 2012-09-15 MED ORDER — LENALIDOMIDE 15 MG PO CAPS: 15.0000 mg | ORAL_CAPSULE | Freq: Every day | ORAL | Status: DC

## 2012-09-15 NOTE — Telephone Encounter (Signed)
Trace called and states that he and [name] are going to [place] for a vacation. He will out of town and will need to get his revlimid filled before leaving. He currently has 6 pills. He will end this cycle Tuesday 09/20/12. I called [employer] and they state as long as he has less than 7 pills they can send the refill authorization. They will fax to our office today.

## 2012-09-19 ENCOUNTER — Other Ambulatory Visit: Payer: Self-pay | Admitting: Medical Oncology

## 2012-09-19 MED ORDER — ACYCLOVIR 400 MG PO TABS: 400.0000 mg | ORAL_TABLET | Freq: Two times a day (BID) | ORAL | Status: DC

## 2012-09-19 NOTE — Telephone Encounter (Signed)
RECEIVED A FAX FROM [facility] CONCERNING A CONFIRMATION OF PRESCRIPTION SHIPMENT FOR REVLIMID ON 09/16/12. 

## 2012-09-20 ENCOUNTER — Other Ambulatory Visit: Payer: Self-pay | Admitting: Medical Oncology

## 2012-09-20 NOTE — Telephone Encounter (Signed)
Pt called stating that he went to [facility] last evening and they did not have                                                                                                                Pt called asking if I called in his presription for the acylovir. I told him that I did. I called [facility] and they said they did receive it. They did not fill because it is early. I explained that pt is going out of town and will need. She said pt will have to pay $13.90. I asked her to fill and pt notified he can pick it up this afternoon. He voiced understanding.

## 2012-09-27 ENCOUNTER — Telehealth: Payer: Self-pay | Admitting: *Deleted

## 2012-09-27 NOTE — Telephone Encounter (Signed)
[facility] faxed Revlimid prescription refill request.  Request to MD for review.

## 2012-09-29 ENCOUNTER — Other Ambulatory Visit: Payer: Self-pay | Admitting: Medical Oncology

## 2012-09-29 ENCOUNTER — Encounter: Payer: Self-pay | Admitting: Medical Oncology

## 2012-09-30 ENCOUNTER — Other Ambulatory Visit (HOSPITAL_BASED_OUTPATIENT_CLINIC_OR_DEPARTMENT_OTHER): Payer: Medicare Other | Admitting: Lab

## 2012-09-30 ENCOUNTER — Other Ambulatory Visit: Payer: Self-pay | Admitting: Medical Oncology

## 2012-09-30 ENCOUNTER — Telehealth: Payer: Self-pay | Admitting: Medical Oncology

## 2012-09-30 ENCOUNTER — Ambulatory Visit: Payer: Medicare Other

## 2012-09-30 ENCOUNTER — Telehealth: Payer: Self-pay | Admitting: Oncology

## 2012-09-30 DIAGNOSIS — C9000 Multiple myeloma not having achieved remission: Secondary | ICD-10-CM

## 2012-09-30 LAB — CBC WITH DIFFERENTIAL/PLATELET
Basophils Absolute: 0.1 10*3/uL (ref 0.0–0.1)
EOS%: 3.4 % (ref 0.0–7.0)
Eosinophils Absolute: 0.2 10*3/uL (ref 0.0–0.5)
HGB: 11 g/dL — ABNORMAL LOW (ref 13.0–17.1)
LYMPH%: 8.4 % — ABNORMAL LOW (ref 14.0–49.0)
MCH: 35.9 pg — ABNORMAL HIGH (ref 27.2–33.4)
MCV: 104.9 fL — ABNORMAL HIGH (ref 79.3–98.0)
MONO%: 30.6 % — ABNORMAL HIGH (ref 0.0–14.0)
NEUT#: 3.3 10*3/uL (ref 1.5–6.5)
NEUT%: 55.6 % (ref 39.0–75.0)
Platelets: 124 10*3/uL — ABNORMAL LOW (ref 140–400)
RDW: 15.4 % — ABNORMAL HIGH (ref 11.0–14.6)

## 2012-09-30 MED ORDER — OXYCODONE HCL 10 MG PO TB12: 10.0000 mg | ORAL_TABLET | Freq: Two times a day (BID) | ORAL | Status: DC | PRN

## 2012-09-30 NOTE — Telephone Encounter (Signed)
Pt called and stated that he will try and get his labs done this afternoon.( Ice and snow today) He state that he met a lady at the myeloma seminar that told him she wears a fentanyl patch for pain. He is taking oxycontin BID and asked if this would be an option for him. I told him I will be happy to ask Dr. Arline Asp and I will get back with him. He needs a refill on his oxycontin and he will pick up this afternoon.

## 2012-09-30 NOTE — Telephone Encounter (Signed)
lmonvm for pt re appt for 4/4 and mailed schedule.

## 2012-10-07 ENCOUNTER — Other Ambulatory Visit: Payer: Self-pay | Admitting: Medical Oncology

## 2012-10-11 ENCOUNTER — Encounter: Payer: Self-pay | Admitting: Family Medicine

## 2012-10-11 ENCOUNTER — Ambulatory Visit (INDEPENDENT_AMBULATORY_CARE_PROVIDER_SITE_OTHER)
Admission: RE | Admit: 2012-10-11 | Discharge: 2012-10-11 | Disposition: A | Discharge status: Home / Self Care | Payer: Medicare Other | Source: Ambulatory Visit | Attending: Family Medicine | Admitting: Family Medicine

## 2012-10-11 ENCOUNTER — Ambulatory Visit (INDEPENDENT_AMBULATORY_CARE_PROVIDER_SITE_OTHER): Payer: Medicare Other | Admitting: Family Medicine

## 2012-10-11 ENCOUNTER — Telehealth: Payer: Self-pay

## 2012-10-11 VITALS — BP 130/74 | HR 88 | Temp 98.2°F | Ht 72.0 in | Wt 208.0 lb

## 2012-10-11 DIAGNOSIS — C9000 Multiple myeloma not having achieved remission: Secondary | ICD-10-CM

## 2012-10-11 DIAGNOSIS — N08 Glomerular disorders in diseases classified elsewhere: Secondary | ICD-10-CM

## 2012-10-11 DIAGNOSIS — R05 Cough: Secondary | ICD-10-CM

## 2012-10-11 DIAGNOSIS — J029 Acute pharyngitis, unspecified: Secondary | ICD-10-CM

## 2012-10-11 DIAGNOSIS — R059 Cough, unspecified: Secondary | ICD-10-CM

## 2012-10-11 DIAGNOSIS — K1370 Unspecified lesions of oral mucosa: Secondary | ICD-10-CM

## 2012-10-11 DIAGNOSIS — N2889 Other specified disorders of kidney and ureter: Secondary | ICD-10-CM

## 2012-10-11 NOTE — Addendum Note (Signed)
Addended by: Alvina Chou on: 10/11/2012 11:50 AM   Modules accepted: Orders

## 2012-10-11 NOTE — Patient Instructions (Addendum)
Most likely viral infection. We will let you know results of CXR and throat culture. Supportive care. mucinex DM for cough, tyelnol for throat pain.  Call if symptoms worsening or shortness of breath.

## 2012-10-11 NOTE — Assessment & Plan Note (Signed)
No throat exudate, but exposure to strep... Rapid strep negative... Send for throat culture for verification.   More likely due to viral infection such as coxsackie etc.  Symptomatic care.

## 2012-10-11 NOTE — Progress Notes (Signed)
  Subjective:    Patient ID: [name], male    DOB: [birth year], 67 y.o.   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 Appeared black/blood raised blister, opened next day, but is sore. Throat very sore, fatigued, some cough and congestion.  No SOB, no wheeze, but some rattling with deep breaths. Very low grade temp 99.0 at home.  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   Onc is Dr. Arline Asp.  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  Recent cbc showed no neutropenia, but otherwise pancytopenia.   Remote smoking history.  Review of Systems  Constitutional: Positive for fever and fatigue.  HENT: Positive for ear pain.   Eyes: Negative for pain.  Respiratory: Positive for shortness of breath. Negative for wheezing.   Cardiovascular: Negative for chest pain, palpitations and leg swelling.  Gastrointestinal: Negative for abdominal pain.       Objective:   Physical Exam  Constitutional: Vital signs are normal. He appears well-developed and well-nourished.  Non-toxic appearance. He does not appear ill. No distress.  HENT:  Head: Normocephalic and atraumatic.  Right Ear: Hearing, tympanic membrane, external ear and ear canal normal. No tenderness. No foreign bodies. Tympanic membrane is not retracted and not bulging.  Left Ear: Hearing, tympanic membrane, external ear and ear canal normal. No tenderness. No foreign bodies. Tympanic membrane is not retracted and not bulging.  Nose: Nose normal. No mucosal edema or rhinorrhea. Right sinus exhibits no maxillary sinus tenderness and no frontal sinus tenderness. Left sinus exhibits no maxillary sinus tenderness and no frontal sinus tenderness.  Mouth/Throat: Uvula is midline and mucous membranes are normal. Normal dentition. No edematous or dental  caries. Posterior oropharyngeal edema and posterior oropharyngeal erythema present. No oropharyngeal exudate or tonsillar abscesses.  Ulcer / pooped blister on left lateral tounge, possible small blister on right posterior oropharynx.  Eyes: Conjunctivae, EOM and lids are normal. Pupils are equal, round, and reactive to light. No foreign bodies found.  Neck: Trachea normal, normal range of motion and phonation normal. Neck supple. Carotid bruit is not present. No mass and no thyromegaly present.  Cardiovascular: Normal rate, regular rhythm, S1 normal, S2 normal, normal heart sounds, intact distal pulses and normal pulses.  Exam reveals no gallop.   No murmur heard. Pulmonary/Chest: Effort normal. No respiratory distress. He has no decreased breath sounds. He has no wheezes. He has rhonchi. He has no rales.  Abdominal: Soft. Normal appearance and bowel sounds are normal. There is no hepatosplenomegaly. There is no tenderness. There is no rebound, no guarding and no CVA tenderness. No hernia.  Neurological: He is alert. He has normal reflexes.  Skin: Skin is warm, dry and intact. No rash noted.  Psychiatric: He has a normal mood and affect. His speech is normal and behavior is normal. Judgment normal.          Assessment & Plan:

## 2012-10-11 NOTE — Telephone Encounter (Signed)
[name] called stating that Rawlins had a "blood blister" on the bottom of his tongue that appears to have popped. The area is now red. Pt is getting a cold and has a sore throat. He has a low grade fever. He has a history of strep throat. He is on Revlimid and decadron. I conferred with Dr Arline Asp and told [name] to have Dasean go to his primary MD today.

## 2012-10-11 NOTE — Assessment & Plan Note (Signed)
Will eval with CXR given immunocompromised and new rhonchi on lung exam.

## 2012-10-12 LAB — PROTEIN, URINE, 24 HOUR
Collection Interval-UPROT: 24 hours
Protein, 24H Urine: 112 mg/d — ABNORMAL HIGH (ref 50–100)
Protein, Urine: 7 mg/dL

## 2012-10-14 LAB — CULTURE, GROUP A STREP: Organism ID, Bacteria: NORMAL

## 2012-10-17 ENCOUNTER — Other Ambulatory Visit: Payer: Self-pay | Admitting: Medical Oncology

## 2012-10-17 DIAGNOSIS — C801 Malignant (primary) neoplasm, unspecified: Secondary | ICD-10-CM

## 2012-10-17 MED ORDER — OXYCODONE HCL 5 MG PO TABS: 5.0000 mg | ORAL_TABLET | Freq: Four times a day (QID) | ORAL | Status: DC | PRN

## 2012-10-18 ENCOUNTER — Other Ambulatory Visit: Payer: Self-pay | Admitting: *Deleted

## 2012-10-18 DIAGNOSIS — C9000 Multiple myeloma not having achieved remission: Secondary | ICD-10-CM

## 2012-10-18 MED ORDER — LENALIDOMIDE 15 MG PO CAPS: 15.0000 mg | ORAL_CAPSULE | Freq: Every day | ORAL | Status: DC

## 2012-10-18 NOTE — Telephone Encounter (Signed)
RECEIVED A FAX FROM [facility]ERNING A CONFIRMATION OF FACSIMILE RECEIPT FOR PT. REFERRAL. 

## 2012-10-18 NOTE — Addendum Note (Signed)
Addended by: Arvilla Meres on: 10/18/2012 03:03 PM   Modules accepted: Orders

## 2012-10-18 NOTE — Telephone Encounter (Signed)
THIS REFILL REQUEST FOR REVLIMID WAS GIVEN TO DR.MURINSON'S NURSE, ROBIN BASS,RN. 

## 2012-10-24 NOTE — Telephone Encounter (Signed)
RECEIVED A FAX FROM [facility]ERNING A CONFIRMATION OF PRESCRIPTION SHIPMENT FOR REVLIMID ON 10/21/12.

## 2012-10-28 ENCOUNTER — Ambulatory Visit (HOSPITAL_BASED_OUTPATIENT_CLINIC_OR_DEPARTMENT_OTHER): Payer: Medicare Other

## 2012-10-28 ENCOUNTER — Other Ambulatory Visit: Payer: Self-pay | Admitting: Oncology

## 2012-10-28 ENCOUNTER — Ambulatory Visit (HOSPITAL_BASED_OUTPATIENT_CLINIC_OR_DEPARTMENT_OTHER): Payer: Medicare Other | Admitting: Physician Assistant

## 2012-10-28 ENCOUNTER — Other Ambulatory Visit (HOSPITAL_BASED_OUTPATIENT_CLINIC_OR_DEPARTMENT_OTHER): Payer: Medicare Other | Admitting: Lab

## 2012-10-28 ENCOUNTER — Telehealth: Payer: Self-pay | Admitting: *Deleted

## 2012-10-28 VITALS — BP 150/95 | HR 89 | Resp 16

## 2012-10-28 VITALS — BP 144/76 | HR 71 | Temp 97.4°F | Resp 18 | Ht 72.0 in | Wt 209.6 lb

## 2012-10-28 DIAGNOSIS — C9000 Multiple myeloma not having achieved remission: Secondary | ICD-10-CM

## 2012-10-28 DIAGNOSIS — D696 Thrombocytopenia, unspecified: Secondary | ICD-10-CM

## 2012-10-28 DIAGNOSIS — N289 Disorder of kidney and ureter, unspecified: Secondary | ICD-10-CM

## 2012-10-28 DIAGNOSIS — M810 Age-related osteoporosis without current pathological fracture: Secondary | ICD-10-CM

## 2012-10-28 DIAGNOSIS — N189 Chronic kidney disease, unspecified: Secondary | ICD-10-CM

## 2012-10-28 DIAGNOSIS — C61 Malignant neoplasm of prostate: Secondary | ICD-10-CM

## 2012-10-28 DIAGNOSIS — N08 Glomerular disorders in diseases classified elsewhere: Secondary | ICD-10-CM

## 2012-10-28 LAB — COMPREHENSIVE METABOLIC PANEL (CC13)
ALT: 20 U/L (ref 0–55)
AST: 12 U/L (ref 5–34)
Alkaline Phosphatase: 73 U/L (ref 40–150)
Glucose: 109 mg/dl — ABNORMAL HIGH (ref 70–99)
Potassium: 3.8 mEq/L (ref 3.5–5.1)
Sodium: 140 mEq/L (ref 136–145)
Total Bilirubin: 0.47 mg/dL (ref 0.20–1.20)
Total Protein: 6.2 g/dL — ABNORMAL LOW (ref 6.4–8.3)

## 2012-10-28 LAB — CBC WITH DIFFERENTIAL/PLATELET
BASO%: 0.2 % (ref 0.0–2.0)
EOS%: 0 % (ref 0.0–7.0)
MCH: 35.7 pg — ABNORMAL HIGH (ref 27.2–33.4)
MCHC: 34 g/dL (ref 32.0–36.0)
MCV: 105.2 fL — ABNORMAL HIGH (ref 79.3–98.0)
MONO%: 18.9 % — ABNORMAL HIGH (ref 0.0–14.0)
RBC: 3.08 10*6/uL — ABNORMAL LOW (ref 4.20–5.82)
RDW: 14.7 % — ABNORMAL HIGH (ref 11.0–14.6)
lymph#: 0.7 10*3/uL — ABNORMAL LOW (ref 0.9–3.3)

## 2012-10-28 MED ORDER — ZOLEDRONIC ACID 4 MG/100ML IV SOLN
4.0000 mg | Freq: Once | INTRAVENOUS | Status: AC
  Administered 2012-10-28: 4 mg via INTRAVENOUS
  Filled 2012-10-28: qty 100

## 2012-10-28 MED ORDER — FENTANYL 25 MCG/HR TD PT72: 1.0000 | MEDICATED_PATCH | TRANSDERMAL | Status: DC

## 2012-10-28 NOTE — Progress Notes (Signed)
Quick Note:  Please notify patient and call/fax these results to patient's doctors. ______ 

## 2012-10-28 NOTE — Telephone Encounter (Signed)
gv and printed appt schedule for pt for may and June

## 2012-10-28 NOTE — Patient Instructions (Addendum)
Zoledronic Acid injection (Hypercalcemia, Oncology) What is this medicine? ZOLEDRONIC ACID (ZOE le dron ik AS id) lowers the amount of calcium loss from bone. It is used to treat too much calcium in your blood from cancer. It is also used to prevent complications of cancer that has spread to the bone. This medicine may be used for other purposes; ask your health care provider or pharmacist if you have questions. What should I tell my health care provider before I take this medicine? They need to know if you have any of these conditions: -aspirin-sensitive asthma -dental disease -kidney disease -an unusual or allergic reaction to zoledronic acid, other medicines, foods, dyes, or preservatives -pregnant or trying to get pregnant -breast-feeding How should I use this medicine? This medicine is for infusion into a vein. It is given by a health care professional in a hospital or clinic setting. Talk to your pediatrician regarding the use of this medicine in children. Special care may be needed. Overdosage: If you think you have taken too much of this medicine contact a poison control center or emergency room at once. NOTE: This medicine is only for you. Do not share this medicine with others. What if I miss a dose? It is important not to miss your dose. Call your doctor or health care professional if you are unable to keep an appointment. What may interact with this medicine? -certain antibiotics given by injection -NSAIDs, medicines for pain and inflammation, like ibuprofen or naproxen -some diuretics like bumetanide, furosemide -teriparatide -thalidomide This list may not describe all possible interactions. Give your health care provider a list of all the medicines, herbs, non-prescription drugs, or dietary supplements you use. Also tell them if you smoke, drink alcohol, or use illegal drugs. Some items may interact with your medicine. What should I watch for while using this medicine? Visit  your doctor or health care professional for regular checkups. It may be some time before you see the benefit from this medicine. Do not stop taking your medicine unless your doctor tells you to. Your doctor may order blood tests or other tests to see how you are doing. Women should inform their doctor if they wish to become pregnant or think they might be pregnant. There is a potential for serious side effects to an unborn child. Talk to your health care professional or pharmacist for more information. You should make sure that you get enough calcium and vitamin D while you are taking this medicine. Discuss the foods you eat and the vitamins you take with your health care professional. Some people who take this medicine have severe bone, joint, and/or muscle pain. This medicine may also increase your risk for a broken thigh bone. Tell your doctor right away if you have pain in your upper leg or groin. Tell your doctor if you have any pain that does not go away or that gets worse. What side effects may I notice from receiving this medicine? Side effects that you should report to your doctor or health care professional as soon as possible: -allergic reactions like skin rash, itching or hives, swelling of the face, lips, or tongue -anxiety, confusion, or depression -breathing problems -changes in vision -feeling faint or lightheaded, falls -jaw burning, cramping, pain -muscle cramps, stiffness, or weakness -trouble passing urine or change in the amount of urine Side effects that usually do not require medical attention (report to your doctor or health care professional if they continue or are bothersome): -bone, joint, or muscle pain -  fever -hair loss -irritation at site where injected -loss of appetite -nausea, vomiting -stomach upset -tired This list may not describe all possible side effects. Call your doctor for medical advice about side effects. You may report side effects to FDA at  1-800-FDA-1088. Where should I keep my medicine? This drug is given in a hospital or clinic and will not be stored at home. NOTE: This sheet is a summary. It may not cover all possible information. If you have questions about this medicine, talk to your doctor, pharmacist, or health care provider.  2013, [employer]. (01/09/2011 9:06:58 AM)  

## 2012-10-28 NOTE — Telephone Encounter (Signed)
Per staff phone call and POF I have schedueld appts.  JMW  

## 2012-10-28 NOTE — Progress Notes (Signed)
[facility]  [phone]    OFFICE PROGRESS NOTE CC: Jeremiah Candle, MD  [name], M.D.  Jeremiah Nora, MD  Jeremiah Purpura, MD  [name], M.D.  [name], M.D.   PROBLEM LIST:  1. Kappa light chain multiple myeloma, ISS III with diagnosis established in December 2012 when the patient presented with marked anemia, thrombocytopenia and extensive bone marrow replacement with immature plasma cells/plasma blasts and circulating plasma cells in the peripheral blood. Bone marrow was carried out on 07/08/2011. In addition, the patient had severe renal insufficiency and hypercalcemia. He was requiring transfusions of platelets and red cells. He appeared to have limited bone disease characterized by mild compression fractures of T12 and L3. A bone density scan done a couple months preceding the diagnosis had been normal. Results of cytogenetic analysis revealed the presence of 2 clonal cell lines. The 1st cell line was chromosomally normal (20% of cells). 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 There was a gain of chromosome 7 and a marker chromosome. 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 FISH studies showed loss of chromosome 4, loss of chromosome 14, loss of chromosome 13, and loss of chromosome 17, i.e. 4 P-, 14 Q-, 13 Q- and 17 P-. These findings would be consistent with abnormalities associated with multiple myeloma. The patient received Velcade, Cytoxan, and Decadron on a weekly basis from 07/10/2011 through 10/23/2011. The patient developed pain in the region of his right hip during the month of Dec 01, 2022. An MRI of the right hip at [name] on 11/19/2011 showed a destructive lesion of the right pubic body and adjacent rami with associated soft tissue component. There was marked heterogeneous abnormal marrow  signal throughout the visualized marrow. The patient also underwent an MRI of the lumbar spine with and without IV contrast on 11/19/2011. Multiple compression deformities were noted, most severely at L3, where there was slight retropulsion resulting in moderate canal stenosis. An MRI of the right knee on 12/01/2011 showed no evidence of multiple myeloma; however, there was a horizontal medial meniscus tear involving the body and posterior horn and degenerative changes. These studies were carried out at [name]. The patient underwent radiation treatments to the right hemipelvis. He received apparently 10 treatments at [name] with some improvement in pain. [name] then underwent high-dose chemotherapy on 11/23/2011, consisting of carmustine 600 mg IV. He received cytarabine 400 mg IV on 12/24/2011, 12/25/2011, 12/26/2011, and 12/27/2011. He received etoposide 300 mg IV daily from 12/24/2011 through 12/27/2011, four doses. He received melphalan 280 mg IV on 12/28/2011. He was scheduled to receive autologous stem cell reinfusion on 12/29/2011. The patient's initial hospitalization was from 12/23/2011 through 12/28/2011. He required readmission at [name] from 01/06/2012 through 01/10/2012. He had developed fever, shaking chills, diarrhea, weakness, and hypotension. One out of 2 blood cultures grew out gram-positive cocci, felt to be [name] from his [name] catheter. He was treated with ceftazidime and vancomycin. Vancomycin was discontinued after a 10-day course on 01/15/2012. The patient was found to have progressive disease in late September 2013 as evidenced by an increase in his serum kappa light chains and serum protein M spike. It was recommended that the patient start Revlimid 15 mg daily, 3 weeks on, 1 week off, and Decadron 40 mg weekly, in conjunction with aspirin 81 mg daily. Those treatments started on  05/11/2012. Due to significant amount of tremors, his Decadron was eventually decreased to 20 mg  weekly, but otherwise continuing with current therapy, as documented by [name]. 2. Pancytopenia at diagnosis secondary to extensive marrow replacement by multiple myeloma.  3. Renal insufficiency, markedly improved since diagnosis.  4. Hypercalcemia at presentation, now resolved following treatment.  5. Anemia which developed in mid January 2012 secondary to treatment responsive to Aranesp.  6. History of prostate cancer dating back to September 2003 treated with robotic assisted laparoscopic radical prostatectomy by Dr. Hel[facility]. The tumor was pathologic T3a, involving both lobes of the prostate with capsular extension and positive margin focally. The patient did not receive postoperative radiation.  7. Hypertension.  8. Dyslipidemia.  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  10. Right-sided Port-A-Cath placement on 08/05/2011. This was subsequently removed when the patient was at [name] in June 2013.  11. Kyphoplasty carried out by Dr. [name] on 10/13/2011 involving vertebral bodies T12, L2 and L3. Review of bone biopsy pathology from kyphoplasties done on 10/13/2011 showed scattered plasma cells with no kappa or lambda light chain restriction.  12. Destructive lesion of the right pubic body and adjacent rami with associated soft tissue component detected on MRI of the right hip on 11/19/2011 at [facility]. The patient received 10 radiation treatments at [facility] with improvement in pain.  13. Weight loss of approximately 35 pounds from May through June 2013. The patient had regained all his weight by December 2013.  14. Left-sided Port-A-Cath was placed by Interventional Radiology on 07/29/2012.     MEDICATIONS:  Current Outpatient Prescriptions  Medication Sig Dispense Refill  . acyclovir (ZOVIRAX) 400 MG tablet Take 1 tablet (400 mg total) by mouth 2 (two) times daily.  14 tablet  0  . allopurinol (ZYLOPRIM) 100 MG tablet Take 1  tablet (100 mg total) by mouth daily.  90 tablet  3  . aspirin 81 MG tablet Take 81 mg by mouth daily.      Marland Kitchen dexamethasone (DECADRON) 4 MG tablet Take 10 tabs = 40 mg weekly  40 tablet  3  . gabapentin (NEURONTIN) 300 MG capsule Take 1 capsule (300 mg total) by mouth 4 (four) times daily.  360 capsule  2  . lenalidomide (REVLIMID) 15 MG capsule Take 1 capsule (15 mg total) by mouth daily. Take for 21 days then rest for 7 days  21 capsule  0  . lidocaine-prilocaine (EMLA) cream Apply topically as needed. Apply to port site one hour before treatment and cover with plastic wrap  30 g  2  . oxyCODONE (OXY IR/ROXICODONE) 5 MG immediate release tablet Take 1 tablet (5 mg total) by mouth every 6 (six) hours as needed for pain.  90 tablet  0  . oxyCODONE (OXYCONTIN) 10 MG 12 hr tablet Take 1 tablet (10 mg total) by mouth every 12 (twelve) hours as needed.  120 tablet  0  . pantoprazole (PROTONIX) 40 MG tablet Take 1 tablet by mouth  daily  90 tablet  1  . prochlorperazine (COMPAZINE) 10 MG tablet Take 10 mg by mouth every 6 (six) hours as needed.      . fentaNYL (DURAGESIC - DOSED MCG/HR) 25 MCG/HR Place 1 patch (25 mcg total) onto the skin every 3 (three) days.  5 patch  0  . zolpidem (AMBIEN) 5 MG tablet Take 5 mg by mouth at bedtime as needed. For sleep       No current  facility-administered medications for this visit.   -Decadron, currently 20 mg weekly as of 08/10/2012. The patient had been started on Decadron 40 mg weekly on 05/11/2012.  -Revlimid 15 mg daily, 3 weeks on, 1 week off. Revlimid was started  on 05/11/2012.  -Zometa given every 2 months, first dose on 09/02/2012, 4 mg. Dental clearance has been obtained.   ALLERGIES:   Allergies  Allergen Reactions  . Celebrex (Celecoxib) Nausea Only  . Meloxicam Itching  . Percocet (Oxycodone-Acetaminophen) Itching  . Vicodin (Hydrocodone-Acetaminophen) Itching   IMMUNIZATIONS:  Pneumovax was given on 04/28/2011 at the [facility].  He had a flu shot on 04/13/2012.  SMOKING HISTORY: The patient had smoked 2 packs of cigarettes a day for approximately 25 years but stopped smoking in 1998.  [facility] today for followup of his kappa light chain multiple myeloma diagnosed in December 2012. [name] was last seen by [place] on 09/01/2012. On 07/29/2012 he had a Port-A-Cath placed because of difficulties with venous access.  He is overall doing well, his tremors have significantly reduced after a decrease in the dose of Decadron to 20 mg weekly.he tolerated his first dose of Zometa on 09/02/2012 without any complications His main problem is low back pain which remains stable. He takes OxyContin 10 mg every 12 hours, also OxyIR two 5 mg tablets usually about mid day.however, he has attended a patient information session in [place], at which time, he had learned that pain control could be obtained with fentanyl patch to replace oral pain medications. He verbalizes interest in initiating therapy with fentanyl patch for control of his symptoms. He is not having any right hip pain at this time.in addition, he has learned during the meeting, that vitamin D may be beneficial for patients with multiple myeloma. He has never had a vitamin Dchecked this facility.Rest of his review of systems is negative.     PHYSICAL EXAMINATION:   Filed Vitals:   10/28/12 0853  BP: 144/76  Pulse: 71  Temp: 97.4 F (36.3 C)  Resp: 18   Filed Weights   10/28/12 0853  Weight: 209 lb 9.6 oz (95.074 kg)   He looks well.There is no scleral icterus. Mouth and pharynx are benign. He has an upper plate. No peripheral adenopathy palpable. Heart and lungs are normal. There is a new Port-A-Cath on the left side. The heart and lungs are normal. Abdomen is benign with no organomegaly or masses palpable. Extremities no peripheral edema. Neurologic exam is normal.    LABORATORY/RADIOLOGY DATA:   Recent Labs Lab 10/28/12 0836  WBC 6.2  HGB 11.0*  HCT 32.4*   PLT 107*  MCV 105.2*  MCH 35.7*  MCHC 34.0  RDW 14.7*  LYMPHSABS 0.7*  MONOABS 1.2*  EOSABS 0.0  BASOSABS 0.0    CMP   No results found for this basename: NA, K, CL, CO2, GLUCOSE, BUN, CREATININE, GFRCGP, CALCIUM, MG, AST, ALT, ALKPHOS, BILITOT,  in the last 168 hours  LABORATORY DATA: on 09/01/2012 white count 5.4, ANC 4.4, hemoglobin 11.6, hematocrit 33.0, platelets 137,000. Chemistries from today notable for an albumin of 3.1, glucose 163, BUN 29, creatinine 1.2. LDH was 204. Total protein was 6.7. Kappa  light chains were 1.01, with lambda free light chain of 1.8 and kappa lambda ratio of 0.5.SPEP with immunofixation shows IgG of 869, IgA 82, IgM 29. No monoclonal protein was identified. no M spike detected .  On 09/30/2012 his CBC showed a white count of 5.9 ANC 3.3 hemoglobin  11hematocrit 32.1 platelets 124 and MCV of 104.9 Total volume of urine protein on 03/18/2014was 1600, with 24 hour urine protein of 112. This are currently pending. No immufixation in urine was performed at the time.  Radiology Studies:     IMAGING STUDIES:  1. Two-view chest x-ray on 06/28/2011 shows some mild bronchitic changes.  2. Metastatic bone survey on 07/04/2011 showed no lytic lesions in the visualized axial or appendicular skeleton. There was mild superior end-plate compression deformities at T12 and L3.  3. CT-guided left iliac bone marrow aspirate and core biopsy was carried out on 07/08/2011.  4. MRI of the lumbar spine without IV contrast on 09/30/2011 showed progressive compression fractures at L2, L3 and T12. There is diffuse scattered osseous metastatic disease. There was lumbar spondylosis and degenerative disk disease causing various degrees of impingement at all levels between L2 and S1. Progressive collapse of L3 was noted with 75% loss of vertebral body height, whereas on the prior study of 07/04/2011 there was 20% loss of height. Tere was 6 mm of bony retropulsion. There was  progressive loss of L2 vertebral body noted with 50% loss of height whereas formerly it was at 20% loss. There is a 50-60% loss of vertebral body height noted at T12 minimally increased from the prior study with 2 mm of bony retropulsion. There was a 2.3 cm hemangioma present at the L1 vertebral body.  5. Chest x-ray, 2 view, from 01/06/2012 carried out at [facility] showed increased interstitial opacities, possibly representing edema or infection.  6. MRI of the right hip carried out at [facility] on 04/07/2012 and compared with the prior MRI of the right hip on 11/19/2011 showed interval development of a linear area of abnormal signal seen through the site of previous soft tissue mass within the right superior pubic rami. Findings were felt to represent a pathologic fracture most likely due to the underlying tumor involvement and/or an element of underlying radiation change. There was also re-demonstration of myelomatous metastatic  lesions seen within the right hemipelvis and right proximal femur. Findings appear to be similar to the previous study. There was interval improvement of the previously visualized soft tissue mass involving the superior and inferior pubic rami on the right. There continued to be mild cortical expansion and destruction within this region.  7. Port-A-Cath placement with ultrasound and fluoroscopic guidance was carried out on 07/29/2012 by Interventional Radiology. The report indicates that the catheter was placed on the right side through the right internal jugular vein. However, I believe, the Port-A-Cath is actually situated on the left side. 8. 10/11/2012 x ray chest The heart, mediastinal and hilar contours are normal.  There are no infiltrates, edema or masses.  There are no effusions or pneumothoraces.  There are no acute bony changes.  There is an old fracture of the right eighth rib.  There is a 40% anterior wedge compression of one of the mid  thoracic vertebrae probably T5 or T6.  This  has progressed slightly since the previous study.    ASSESSMENT AND PLAN:   [name] continues to do well. The Decadron dose has been changed from 40 mg weekly to 20 mg weekly. The patient now has a Port-A-Cath, which is due to be flushed today, and every 8 weeks.he has also received his first dose of Zometa 4 mg IV every 2 months. We will plan to check a CBC in 1 month and will plan to see [name] again in 2 months, which will  be around, at which time we will check CBC, chemistries, serum immunofixation electrophoresis and quantitative immunoglobulins. Urine immunofixation will be checked as well.  He will continue with taking Revlimid 15 mg daily 3 weeks out of every 4, i.e. 3 weeks on, 1 week off.currently, but in the cycle he began on April 2 and is due to finish this cycle on 11/16/2012, with one week off before resuming the next cycle.tenths to check his vitamin D levels were unsuccessful. It would be prudent to check them in the setting of his osteoporosis status, and his history of multiple myeloma. Will try to resolve this issue, do to Medicare limitations. Moreover, a prescription for fentanyl patch 25 mcg/h TDQ 72 hrs #10 has been given for pain. He understands that these pain medication will be replacing his oral narcotic medications.  He verbalized  Understanding.all his questions have been answered. Dr. Shirlee Limerick son has seen and evaluated the patient, and review of his extensive chart. Greater than 60 minutes have been spent face-to-face with patient.    [facility] E, PA-C 10/28/2012, 11:37 AM

## 2012-10-31 LAB — KAPPA/LAMBDA LIGHT CHAINS
Kappa free light chain: 0.97 mg/dL (ref 0.33–1.94)
Lambda Free Lght Chn: 1.64 mg/dL (ref 0.57–2.63)

## 2012-10-31 LAB — IGG, IGA, IGM
IgA: 58 mg/dL — ABNORMAL LOW (ref 68–379)
IgG (Immunoglobin G), Serum: 758 mg/dL (ref 650–1600)
IgM, Serum: 31 mg/dL — ABNORMAL LOW (ref 41–251)

## 2012-10-31 LAB — PSA: PSA: 0.32 ng/mL (ref <=4.00)

## 2012-11-04 ENCOUNTER — Other Ambulatory Visit: Payer: Medicare Other | Admitting: Lab

## 2012-11-04 ENCOUNTER — Ambulatory Visit: Payer: Medicare Other | Admitting: Oncology

## 2012-11-07 ENCOUNTER — Other Ambulatory Visit: Payer: Self-pay | Admitting: Medical Oncology

## 2012-11-07 MED ORDER — FENTANYL 25 MCG/HR TD PT72: 1.0000 | MEDICATED_PATCH | TRANSDERMAL | Status: DC

## 2012-11-09 ENCOUNTER — Telehealth: Payer: Self-pay

## 2012-11-09 NOTE — Telephone Encounter (Signed)
Pt stated he asked Zella Ball rn to mail fentanyl Rx to him on Monday and he has not received it yet. S/w Zella Ball and she concurs she mailed it Mon. Told pt to call again tomorrow if he does not receive it by then. Pt agreed.

## 2012-11-10 ENCOUNTER — Other Ambulatory Visit: Payer: Self-pay | Admitting: Oncology

## 2012-11-14 ENCOUNTER — Other Ambulatory Visit: Payer: Self-pay | Admitting: *Deleted

## 2012-11-14 DIAGNOSIS — C9000 Multiple myeloma not having achieved remission: Secondary | ICD-10-CM

## 2012-11-14 MED ORDER — LENALIDOMIDE 15 MG PO CAPS: 15.0000 mg | ORAL_CAPSULE | Freq: Every day | ORAL | Status: DC

## 2012-11-17 ENCOUNTER — Telehealth: Payer: Self-pay | Admitting: Oncology

## 2012-11-17 NOTE — Telephone Encounter (Signed)
Faxed pt medical records to [facility].

## 2012-11-18 ENCOUNTER — Ambulatory Visit: Payer: Medicare Other

## 2012-11-18 ENCOUNTER — Ambulatory Visit: Payer: Medicare Other | Admitting: Oncology

## 2012-11-18 ENCOUNTER — Other Ambulatory Visit: Payer: Medicare Other | Admitting: Lab

## 2012-11-25 ENCOUNTER — Telehealth: Payer: Self-pay | Admitting: Medical Oncology

## 2012-11-25 NOTE — Telephone Encounter (Signed)
I called pt to inform him that we received his co-pay assistance form. Dr. Arline Asp signed and form faxed back to [phone]

## 2012-11-29 ENCOUNTER — Telehealth: Payer: Self-pay

## 2012-11-29 NOTE — Telephone Encounter (Signed)
Pt called at 812 am stating he missed his PM revlimid yesterday. Asking for advice what to do. At 1030 AM called back to tell him to skip last nights dose/not to make it up. He stated he called [facility] in the interim and they told him the same thing. He destroyed last nights tablet.

## 2012-11-29 NOTE — Telephone Encounter (Signed)
[employer]uk[employer]oma [employer] co-pay assistance program sent [place] back pt's application for name of diagnosis to be filled in. I filled in "multiple myeloma" and faxed back the form.

## 2012-12-01 ENCOUNTER — Telehealth: Payer: Self-pay

## 2012-12-01 ENCOUNTER — Other Ambulatory Visit: Payer: Self-pay | Admitting: Oncology

## 2012-12-01 NOTE — Telephone Encounter (Signed)
Pt had dropped off labs drawn 11/23/12 at [facility]. S/w pt that he had labs on 4/30 and Dr Arline Asp says he does not need labs tomorrow. Confirmed 12/29/12 appt.

## 2012-12-02 ENCOUNTER — Other Ambulatory Visit: Payer: Medicare Other | Admitting: Lab

## 2012-12-06 ENCOUNTER — Other Ambulatory Visit: Payer: Self-pay | Admitting: Medical Oncology

## 2012-12-06 MED ORDER — FENTANYL 25 MCG/HR TD PT72: 1.0000 | MEDICATED_PATCH | TRANSDERMAL | Status: DC

## 2012-12-08 ENCOUNTER — Encounter: Payer: Self-pay | Admitting: Oncology

## 2012-12-08 NOTE — Progress Notes (Signed)
I received an office note dated 12/01/2012 from Dr. [name].  PSA had increased to 0.22 in November 2012 indicating biochemical recurrence. The PSA was ordered 4 12/01/2012.  I had spoken to Dr. Laverle Patter about a week ago. The plan for now is to carry out PSA surveillance with another PSA in 3-4 months. It is felt that the patient most likely has a local recurrence rather than systemic recurrence. Ideally, salvage radiation therapy to the prostate bed should be given earlier rather than later in order to achieve the best opportunity for cure. The decision making here is complicated by the patient's prognosis regarding his multiple myeloma. Dr. Laverle Patter felt that if the patient had a 10 year life expectancy, then he would advocate for salvage radiation treatments at this time.   The characteristics of this patient's prostate cancer were as follows:  TNM stage:pT3 N0 MX Gleason score: 4+3 = 7 (tertiary pattern 5) Surgical margins: Focally positive at left apex Pre-treatment PSA: 4.87

## 2012-12-13 ENCOUNTER — Other Ambulatory Visit: Payer: Self-pay | Admitting: *Deleted

## 2012-12-13 DIAGNOSIS — C9000 Multiple myeloma not having achieved remission: Secondary | ICD-10-CM

## 2012-12-13 MED ORDER — LENALIDOMIDE 15 MG PO CAPS: 15.0000 mg | ORAL_CAPSULE | Freq: Every day | ORAL | Status: DC

## 2012-12-13 NOTE — Addendum Note (Signed)
Addended by: Arvilla Meres on: 12/13/2012 02:37 PM   Modules accepted: Orders

## 2012-12-13 NOTE — Telephone Encounter (Signed)
THIS REFILL REQUEST FOR REVLIMID WAS GIVEN TO DR.MURINSON'S NURSE, ROBIN BASS,RN. 

## 2012-12-13 NOTE — Telephone Encounter (Signed)
RECEIVED A FAX FROM [facility]ERNING A CONFIRMATION OF FACSIMILE RECEIPT FOR PT. REFERRAL. 

## 2012-12-29 ENCOUNTER — Other Ambulatory Visit: Payer: Self-pay

## 2012-12-29 ENCOUNTER — Telehealth: Payer: Self-pay | Admitting: *Deleted

## 2012-12-29 ENCOUNTER — Encounter: Payer: Self-pay | Admitting: Oncology

## 2012-12-29 ENCOUNTER — Ambulatory Visit (HOSPITAL_BASED_OUTPATIENT_CLINIC_OR_DEPARTMENT_OTHER): Payer: Medicare Other

## 2012-12-29 ENCOUNTER — Telehealth: Payer: Self-pay | Admitting: Oncology

## 2012-12-29 ENCOUNTER — Ambulatory Visit (HOSPITAL_BASED_OUTPATIENT_CLINIC_OR_DEPARTMENT_OTHER): Payer: Medicare Other | Admitting: Oncology

## 2012-12-29 ENCOUNTER — Other Ambulatory Visit (HOSPITAL_BASED_OUTPATIENT_CLINIC_OR_DEPARTMENT_OTHER): Payer: Medicare Other | Admitting: Lab

## 2012-12-29 ENCOUNTER — Other Ambulatory Visit: Payer: Self-pay | Admitting: Oncology

## 2012-12-29 VITALS — BP 150/89 | HR 96 | Temp 97.7°F | Resp 19 | Ht 72.0 in | Wt 219.4 lb

## 2012-12-29 DIAGNOSIS — D631 Anemia in chronic kidney disease: Secondary | ICD-10-CM

## 2012-12-29 DIAGNOSIS — D696 Thrombocytopenia, unspecified: Secondary | ICD-10-CM

## 2012-12-29 DIAGNOSIS — N289 Disorder of kidney and ureter, unspecified: Secondary | ICD-10-CM

## 2012-12-29 DIAGNOSIS — C9000 Multiple myeloma not having achieved remission: Secondary | ICD-10-CM

## 2012-12-29 DIAGNOSIS — I10 Essential (primary) hypertension: Secondary | ICD-10-CM

## 2012-12-29 DIAGNOSIS — M810 Age-related osteoporosis without current pathological fracture: Secondary | ICD-10-CM

## 2012-12-29 DIAGNOSIS — N189 Chronic kidney disease, unspecified: Secondary | ICD-10-CM

## 2012-12-29 DIAGNOSIS — G609 Hereditary and idiopathic neuropathy, unspecified: Secondary | ICD-10-CM

## 2012-12-29 LAB — COMPREHENSIVE METABOLIC PANEL (CC13)
AST: 15 U/L (ref 5–34)
Albumin: 3.5 g/dL (ref 3.5–5.0)
BUN: 26 mg/dL (ref 7.0–26.0)
Calcium: 9.4 mg/dL (ref 8.4–10.4)
Chloride: 110 mEq/L — ABNORMAL HIGH (ref 98–107)
Potassium: 4.3 mEq/L (ref 3.5–5.1)
Total Protein: 6.5 g/dL (ref 6.4–8.3)

## 2012-12-29 LAB — CBC WITH DIFFERENTIAL/PLATELET
Eosinophils Absolute: 0 10*3/uL (ref 0.0–0.5)
HCT: 36.5 % — ABNORMAL LOW (ref 38.4–49.9)
HGB: 12.6 g/dL — ABNORMAL LOW (ref 13.0–17.1)
LYMPH%: 9.1 % — ABNORMAL LOW (ref 14.0–49.0)
MONO#: 0.3 10*3/uL (ref 0.1–0.9)
NEUT#: 5.7 10*3/uL (ref 1.5–6.5)
NEUT%: 86.2 % — ABNORMAL HIGH (ref 39.0–75.0)
Platelets: 100 10*3/uL — ABNORMAL LOW (ref 140–400)
WBC: 6.6 10*3/uL (ref 4.0–10.3)
lymph#: 0.6 10*3/uL — ABNORMAL LOW (ref 0.9–3.3)

## 2012-12-29 MED ORDER — ZOLEDRONIC ACID 4 MG/100ML IV SOLN
4.0000 mg | Freq: Once | INTRAVENOUS | Status: AC
  Administered 2012-12-29: 4 mg via INTRAVENOUS
  Filled 2012-12-29: qty 100

## 2012-12-29 MED ORDER — FENTANYL 25 MCG/HR TD PT72: 1.0000 | MEDICATED_PATCH | TRANSDERMAL | Status: DC

## 2012-12-29 NOTE — Telephone Encounter (Signed)
gv and printed appt sched and avs for pt....MW added tx....pt ok anda ware °

## 2012-12-29 NOTE — Telephone Encounter (Signed)
Per staff message and POF I have scheduled appts.  JMW  

## 2012-12-29 NOTE — Telephone Encounter (Signed)
gv and printed appt sched and avs for pt...MW added tx...pt ok and awre

## 2012-12-29 NOTE — Progress Notes (Signed)
This office note has been dictated.  #[id number]

## 2012-12-30 NOTE — Progress Notes (Signed)
CC:   [name], MD [name], M.D. [name], MD [name], MD [name], M.D. [name], M.D.  PROBLEM LIST:  1. Kappa light chain multiple myeloma, ISS III with diagnosis established in December 2012 when the patient presented with marked anemia, thrombocytopenia and extensive bone marrow replacement with immature plasma cells/plasma blasts and circulating plasma cells in the peripheral blood. Bone marrow was carried out on 07/08/2011. In addition, the patient had severe renal insufficiency and hypercalcemia. He was requiring transfusions of platelets and red  cells. He appeared to have limited bone disease characterized by mild compression fractures of T12 and L3. A bone density scan done a couple months preceding the diagnosis had been normal. Results of cytogenetic analysis revealed the presence of 2 clonal cell lines. The 1st cell line was chromosomally normal (20% of cells). 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 There was a gain of chromosome 7 and a marker chromosome. 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 FISH studies showed loss of chromosome 4, loss of chromosome 14, loss of chromosome 13, and loss of chromosome 17, i.e. 4 P-, 14 Q-, 13 Q- and 17 P-. These findings would be consistent with abnormalities associated with multiple myeloma. The patient received Velcade, Cytoxan, and Decadron on a weekly basis from 07/10/2011 through 10/23/2011. The patient developed pain in the region of his right hip during the month of 12-01-2022. An MRI of the right hip at [facility] on 11/19/2011 showed a destructive lesion of the right pubic body and adjacent rami with associated soft tissue component. There was marked heterogeneous abnormal marrow signal throughout the visualized marrow. The patient also underwent an MRI of the lumbar  spine with and without IV contrast on 11/19/2011. Multiple compression deformities were noted, most severely at L3, where there was slight retropulsion resulting in moderate canal stenosis. An MRI of the right knee on 12/01/2011 showed no evidence of multiple myeloma; however, there was a horizontal medial meniscus tear involving the body and posterior horn and degenerative changes. These studies were carried out at [facility]. The patient underwent radiation treatments to the right hemipelvis. He received apparently 10 treatments at [facility] with some improvement in pain.  Mr. Scibilia then underwent high-dose chemotherapy on 11/23/2011, consisting of carmustine 600 mg IV. He received cytarabine 400 mg IV on  12/24/2011, 12/25/2011, 12/26/2011, and 12/27/2011. He received etoposide 300 mg IV daily from 12/24/2011 through 12/27/2011, four doses. He received melphalan 280 mg IV on 12/28/2011. He was scheduled to receive autologous stem cell reinfusion on 12/29/2011. The patient's initial hospitalization was from 12/23/2011 through 12/28/2011. He required readmission at [facility] from 01/06/2012 through 01/10/2012. He had developed fever, shaking chills, diarrhea, weakness, and hypotension. One out of 2 blood cultures grew out gram-positive cocci, felt to be staphylococcus from his Hickman catheter. He was treated with ceftazidime and vancomycin. Vancomycin was discontinued after a 10-day course on 01/15/2012.  The patient was found to have progressive disease in late September 2013 as  evidenced by an increase in his serum kappa light chains and serum  protein M spike. It was recommended that the patient start Revlimid 15  mg daily, 3 weeks on, 1 week off, and Decadron 40 mg weekly, in  conjunction with aspirin 81 mg daily. Those treatments started on  05/11/2012.  2. Pancytopenia at diagnosis secondary to  extensive marrow replacement by multiple myeloma.  3. Renal insufficiency, markedly improved since diagnosis.  4.  Hypercalcemia at presentation, now resolved following treatment.  5. Anemia which developed in mid January 2012 secondary to treatment  responsive to Aranesp.  6. History of prostate cancer dating back to September 2003, treated with robotic-assisted laparoscopic radical prostatectomy by Dr. [name].  The tumor was pathologic T3a N0 Mx.  Gleason score was 4+3=7 Tumor involved both lobes of the prostate with capsular extension and a positive margin focally at the left apex.  Pretreatment PSA was 4.87.  The patient did not receive postoperative radiation.  Recently, his PSA has been slowly rising.  PSA on 10/28/2012 was 0.32.  7. Hypertension.  8. Dyslipidemia.  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  10. Right-sided Port-A-Cath placement on 08/05/2011. This was subsequently removed when the patient was at [facility] in June 2013.  11. Kyphoplasty carried out by Dr. [name] on 10/13/2011  involving vertebral bodies T12, L2 and L3. Review of bone biopsy pathology from  kyphoplasties done on 10/13/2011 showed scattered plasma cells with no  kappa or lambda light chain restriction.  12. Destructive lesion of the right pubic body and adjacent rami with associated soft tissue component detected on MRI of the right hip on 11/19/2011 at [facility]. The patient received 10 radiation treatments at [facility] with improvement in pain.  13. Weight loss of approximately 35 pounds from May through June 2013.  The patient had regained all his weight by December 2013.  14. Left-sided Port-A-Cath was placed by Interventional Radiology on  07/29/2012.     MEDICATIONS:  Reviewed and recorded. Current Outpatient Prescriptions  Medication Sig Dispense Refill  . acyclovir (ZOVIRAX) 400 MG tablet Take 1 tablet (400 mg total) by mouth 2 (two) times daily.  14 tablet  0  . allopurinol (ZYLOPRIM) 100 MG tablet Take 1 tablet (100 mg total) by mouth daily.  90 tablet   3  . aspirin 81 MG tablet Take 81 mg by mouth daily.      Marland Kitchen dexamethasone (DECADRON) 4 MG tablet Take 10 tabs = 40 mg weekly  40 tablet  3  . gabapentin (NEURONTIN) 300 MG capsule Take 1 capsule (300 mg total) by mouth 4 (four) times daily.  360 capsule  2  . lenalidomide (REVLIMID) 15 MG capsule Take 1 capsule (15 mg total) by mouth daily. Take for 21 days then rest for 7 days  21 capsule  0  . lidocaine-prilocaine (EMLA) cream Apply topically as needed. Apply to port site one hour before treatment and cover with plastic wrap  30 g  2  . NONFORMULARY OR COMPOUNDED ITEM Baclofn/amitrp/ketamn cream topically to feet TID prn for neuropathy      . pantoprazole (PROTONIX) 40 MG tablet Take 1 tablet by mouth  daily  90 tablet  1  . prochlorperazine (COMPAZINE) 10 MG tablet Take 10 mg by mouth every 6 (six) hours as needed.      . fentaNYL (DURAGESIC - DOSED MCG/HR) 25 MCG/HR Place 1 patch (25 mcg total) onto the skin every 3 (three) days.  10 patch  0  . oxyCODONE (OXY IR/ROXICODONE) 5 MG immediate release tablet Take 1 tablet (5 mg total) by mouth every 6 (six) hours as needed for pain.  90 tablet  0  . oxyCODONE (OXYCONTIN) 10 MG 12 hr tablet Take 1 tablet (10 mg total) by mouth every 12 (twelve) hours as needed.  120  tablet  0  . zolpidem (AMBIEN) 5 MG tablet Take 5 mg by mouth at bedtime as needed. For sleep       No current facility-administered medications for this visit.     TREATMENT PROGRAM: -Revlimid 15 mg daily, 3 weeks on, 1 week off. Revlimid was started  on 05/11/2012.   -Decadron dose is currently 20 mg weekly as of 08/10/2012.  Because of cushingoid effects, the Decadron dose will be decreased to 12 mg weekly starting on Wednesday, June 11th. Decadron was started at 40 mg weekly on 05/11/2012.  -Zometa 4 mg IV is being given every 2 months.  Zometa was started on 09/02/2012.  Dental clearance was obtained.    IMMUNIZATIONS:  Pneumovax was given on 04/28/2011 at the [facility].  He had a flu shot on 04/13/2012.    SMOKING HISTORY: The patient had smoked 2 packs of cigarettes a day for  approximately 25 years but stopped smoking in 1998.     HISTORY:  [name] was seen today for followup of his kappa light chain multiple myeloma diagnosed in December 2012.  Domenique was last seen by [place] on 10/28/2012 and prior to that on 09/01/2012.  He has continued to do extremely well.  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 He sometimes has to travel to [place] and [place] to pick up cars which he is able to drive back to [place].  He does have some lower back discomfort for which he takes Duragesic patch 25 mcg/hour.  He has some easy bruising, probably related to aspirin and the Decadron that he takes.  He has had some neuropathy in his toes.  He is trying out a new cream with baclofen and amitriptyline and ketamine.  Neurontin has not been very helpful.  The patient really denies any pain in his pelvis. He does have some tingling which he says gets better after he takes the Decadron.  He notices the day after he takes his Decadron that his voice is somewhat raspy.  I suggested that he may want to take some over-the- counter proton pump inhibitors such as Protonix or Prilosec.  The patient is functioning well with performance status 0 to 1.  Artavis is taking Revlimid 3 weeks out of every 4.  He is currently taking Revlimid 50 mg daily.  This was started on 12/21/2012 and will conclude on 01/10/2013.  He takes Decadron currently 20 mg every Wednesday; however, I am going to decrease the dose to 12 mg because of cushingoid effects.  Orey is also on Zometa 4 mg every 2 months.  His first treatment was on 09/02/2012 and again on 10/28/2012.  He is due for Zometa today.  His Port-A-Cath was flushed at that time.  He did receive dental clearance.  Luverne was last seen at [facility] by Dr. Barbaraann Boys in 11-09-2022.  He will have another appointment 6 months  later in October.  He is also being followed by Dr. Laverle Patter for a rising PSA.  Dr. Laverle Patter and I talked about this, and for now, I believe the patient is simply under observation. He is contemplating whether the patient should undergo salvage radiation treatments to the pelvis in the hopes that this is a local recurrence, which Dr. Laverle Patter favors.  I believe the PSA will be repeated in September.  PHYSICAL EXAM:  General:  Jushua looks well.  He has been steadily gaining weight.  His weight today is 219 pounds 6.4 ounces.  Height  6 feet even.  Body surface area 2.25 m2.  Blood pressure today 150/89. Other vital signs are normal.  There is no scleral icterus.  Mouth and pharynx are notable for a small lesion on the left buccal mucosa that was pointed out to me.  Apparently Akeel has had intermittent sores in his mouth.  He does not have thrush.  He does look a little cushingoid. There is no peripheral adenopathy palpable.  Heart and lungs are normal. There is a left-sided Port-A-Cath which was placed by Interventional Radiology on 07/29/2012.  Abdomen is benign with no organomegaly or masses palpable.  Extremities:  No peripheral edema or clubbing.  He does have some bruising on his left hand.  He bruises with minimal trauma.  Neurologic exam is grossly normal.  Voice is a little raspy.  LABORATORY DATA:  Today, white count 6.6, ANC 5.7, hemoglobin 12.6, hematocrit 36.5, platelets 100,000.  Chemistries today are notable only for glucose of 127.  BUN 26, creatinine 1.3, albumin 3.5, LDH 189. Quantitative immunoglobulins on 10/28/2012 notable for an IgA and IgM levels that were low, 58 and 31, respectively.  Serum light chains were normal with the kappa free light chain of 0.97.  The patient did have a 24-hour urine collection on 09/30/2012 yielding 112 mg of protein.  PSA on 10/28/2012 was 0.32.  Serum immunofixation electrophoresis on 09/01/2012 was negative.  The 24-hour urine collection on  03/02/2012 yielded 343 mg of protein.  The kappa lambda ratio was 125.26, and the urine immunofixation electrophoresis was positive for kappa light chains.  The patient is having a 24-hour urine collected for protein and urine immunofixation electrophoresis.  IMAGING STUDIES:  1. Two-view chest x-ray on 06/28/2011 shows some mild bronchitic  changes.  2. Metastatic bone survey on 07/04/2011 showed no lytic lesions in  the visualized axial or appendicular skeleton. There was mild  superior end-plate compression deformities at T12 and L3.  3. CT-guided left iliac bone marrow aspirate and core biopsy was  carried out on 07/08/2011.  4. MRI of the lumbar spine without IV contrast on 09/30/2011 showed  progressive compression fractures at L2, L3 and T12. There is  diffuse scattered osseous metastatic disease. There was lumbar  spondylosis and degenerative disk disease causing various degrees  of impingement at all levels between L2 and S1. Progressive  collapse of L3 was noted with 75% loss of vertebral body height,  whereas on the prior study of 07/04/2011 there was 20% loss of  height. There was 6 mm of bony retropulsion. There was  progressive loss of L2 vertebral body noted with 50% loss of height  whereas formerly it was at 20% loss. There is a 50-60% loss of  vertebral body height noted at T12 minimally increased from the  prior study with 2 mm of bony retropulsion. 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  5. Chest x-ray, 2 view, from 01/06/2012 carried out at [facility] showed  increased interstitial opacities, possibly representing edema or  infection.  6. MRI of the right hip carried out at [facility] on 04/07/2012 and compared with  the prior MRI of the right hip on 11/19/2011 showed interval development  of a linear area of abnormal signal seen through the site of previous  soft tissue mass within the right superior pubic rami. Findings were  felt to represent a  pathologic fracture most likely due to the  underlying tumor involvement and/or an element of underlying radiation  change.  There was also re-demonstration of myelomatous metastatic  lesions seen within the right hemipelvis and right proximal femur.  Findings appear to be similar to the previous study. There was interval  improvement of the previously visualized soft tissue mass involving the  superior and inferior pubic rami on the right. There continued to be  mild cortical expansion and destruction within this region.  7. Port-A-Cath placement with ultrasound and fluoroscopic guidance was  carried out on 07/29/2012 by Interventional Radiology. The report  indicates that the catheter was placed on the right side through the  right internal jugular vein. However, I believe, the Port-A-Cath is  actually situated on the left side. 8. Chest x-ray, 2-view, from 10/11/2012 showed no active or acute cardiopulmonary disease.  There was a compression fracture of either T5 or T6 with a 40% anterior wedge compression, which may have progressed slightly since the previous study of 07/07/2011.    IMPRESSION AND PLAN:  Mr. Crate continues to do well on the current treatment program.  As stated, he is still taking Revlimid 15 mg daily, roughly in the middle of his 3-week cycle which he started on 11/21/2012.  The patient takes Decadron 20 mg every Wednesday.  I have suggested he drop this to 12 mg weekly.  He is going to receive Zometa 4 mg IV today and receives this every 2 months.  We refilled the patient's fentanyl prescription today.  Mr. Lazaro wants to stop the Neurontin since he does not feel that it is helping him.  He is taking 300 mg pills, 7 pills a day.  I suggested that he gradually taper this by 1 tablet every 4 days so that by about 1 month he will probably be off of the Neurontin at that time.  He is using the cream that he was given at [facility] on his toes to see if that will help  with the neuropathy.  We are collecting a 24-hour urine today.  The patient will have a followup PSA from Dr. Laverle Patter in September and see Dr. Barbaraann Boys again in October.  We will check CBC and a metastatic bone survey in about 1 month.  The patient's last metastatic bone survey was on 07/04/2011.  He has had scans, however.  We will have Srihaan return in 2 months, which will be July 31st, at which time we will check CBC, chemistries, quantitative immunoglobulins, serum immunofixation electrophoresis, serum light chains, and a beta-2 microglobulin.  The patient will also be due for Zometa at that time.    ______________________________ Samul Dada, M.D. DSM/MEDQ  D:  12/29/2012  T:  12/30/2012  Job:  [id number]

## 2013-01-02 ENCOUNTER — Telehealth: Payer: Self-pay | Admitting: Oncology

## 2013-01-02 LAB — KAPPA/LAMBDA LIGHT CHAINS: Lambda Free Lght Chn: 1.31 mg/dL (ref 0.57–2.63)

## 2013-01-02 LAB — IMMUNOFIXATION ELECTROPHORESIS
IgA: 50 mg/dL — ABNORMAL LOW (ref 68–379)
IgM, Serum: 20 mg/dL — ABNORMAL LOW (ref 41–251)

## 2013-01-04 LAB — UIFE/LIGHT CHAINS/TP QN, 24-HR UR
Alpha 1, Urine: DETECTED — AB
Free Kappa Lt Chains,Ur: 1.44 mg/dL (ref 0.14–2.42)
Free Lambda Excretion/Day: 3.45 mg/d
Free Lambda Lt Chains,Ur: 0.15 mg/dL (ref 0.02–0.67)
Gamma Globulin, Urine: DETECTED — AB
Time: 24 hours
Volume, Urine: 2300 mL

## 2013-01-10 ENCOUNTER — Other Ambulatory Visit: Payer: Self-pay | Admitting: *Deleted

## 2013-01-10 DIAGNOSIS — C9000 Multiple myeloma not having achieved remission: Secondary | ICD-10-CM

## 2013-01-10 MED ORDER — LENALIDOMIDE 15 MG PO CAPS: 15.0000 mg | ORAL_CAPSULE | Freq: Every day | ORAL | Status: DC

## 2013-01-10 NOTE — Telephone Encounter (Signed)
THIS REFILL REQUEST FOR REVLIMID WAS GIVEN TO DR.MURINSON'S NURSE, ROBIN BASS,RN. 

## 2013-01-13 NOTE — Telephone Encounter (Signed)
Received fax from [facility] that rx has been shipped 01/11/13.  SLJ   

## 2013-01-23 ENCOUNTER — Other Ambulatory Visit: Payer: Self-pay | Admitting: Medical Oncology

## 2013-01-23 MED ORDER — PANTOPRAZOLE SODIUM 40 MG PO TBEC: 40.0000 mg | DELAYED_RELEASE_TABLET | Freq: Every day | ORAL | Status: DC

## 2013-01-24 ENCOUNTER — Ambulatory Visit (HOSPITAL_COMMUNITY)
Admission: RE | Admit: 2013-01-24 | Discharge: 2013-01-24 | Disposition: A | Discharge status: Home / Self Care | Payer: Medicare Other | Source: Ambulatory Visit | Attending: Oncology | Admitting: Oncology

## 2013-01-24 ENCOUNTER — Other Ambulatory Visit (HOSPITAL_BASED_OUTPATIENT_CLINIC_OR_DEPARTMENT_OTHER): Payer: Medicare Other

## 2013-01-24 DIAGNOSIS — N058 Unspecified nephritic syndrome with other morphologic changes: Secondary | ICD-10-CM

## 2013-01-24 DIAGNOSIS — M1120 Other chondrocalcinosis, unspecified site: Secondary | ICD-10-CM | POA: Insufficient documentation

## 2013-01-24 DIAGNOSIS — D696 Thrombocytopenia, unspecified: Secondary | ICD-10-CM

## 2013-01-24 DIAGNOSIS — R079 Chest pain, unspecified: Secondary | ICD-10-CM | POA: Insufficient documentation

## 2013-01-24 DIAGNOSIS — M810 Age-related osteoporosis without current pathological fracture: Secondary | ICD-10-CM

## 2013-01-24 DIAGNOSIS — C9000 Multiple myeloma not having achieved remission: Secondary | ICD-10-CM

## 2013-01-24 DIAGNOSIS — C61 Malignant neoplasm of prostate: Secondary | ICD-10-CM | POA: Insufficient documentation

## 2013-01-24 DIAGNOSIS — N08 Glomerular disorders in diseases classified elsewhere: Secondary | ICD-10-CM

## 2013-01-24 DIAGNOSIS — I700 Atherosclerosis of aorta: Secondary | ICD-10-CM | POA: Insufficient documentation

## 2013-01-24 DIAGNOSIS — I10 Essential (primary) hypertension: Secondary | ICD-10-CM | POA: Insufficient documentation

## 2013-01-24 DIAGNOSIS — M5030 Other cervical disc degeneration, unspecified cervical region: Secondary | ICD-10-CM | POA: Insufficient documentation

## 2013-01-24 DIAGNOSIS — D631 Anemia in chronic kidney disease: Secondary | ICD-10-CM

## 2013-01-24 LAB — CBC WITH DIFFERENTIAL/PLATELET
EOS%: 13.3 % — ABNORMAL HIGH (ref 0.0–7.0)
MCH: 37.2 pg — ABNORMAL HIGH (ref 27.2–33.4)
MCV: 106.3 fL — ABNORMAL HIGH (ref 79.3–98.0)
MONO%: 14.3 % — ABNORMAL HIGH (ref 0.0–14.0)
NEUT#: 2.8 10*3/uL (ref 1.5–6.5)
RBC: 3.33 10*6/uL — ABNORMAL LOW (ref 4.20–5.82)
RDW: 15.1 % — ABNORMAL HIGH (ref 11.0–14.6)
lymph#: 0.8 10*3/uL — ABNORMAL LOW (ref 0.9–3.3)
nRBC: 1 % — ABNORMAL HIGH (ref 0–0)

## 2013-01-25 ENCOUNTER — Other Ambulatory Visit (HOSPITAL_COMMUNITY): Payer: Medicare Other

## 2013-01-25 ENCOUNTER — Other Ambulatory Visit: Payer: Medicare Other | Admitting: Lab

## 2013-01-29 ENCOUNTER — Encounter: Payer: Self-pay | Admitting: Oncology

## 2013-01-29 NOTE — Progress Notes (Signed)
It should be noted that the patient had imaging studies carried out at [facility] in April 2013 which showed myelomatous involvement of the bones of the right hemipelvis and received radiation treatments at [facility] prior to undergoing high-dose chemotherapy and autologous stem cell infusion.  I believe the changes noted on the metastatic bone survey carried out here and dated 01/24/2013 involving the right superior pubic ramus are a consequence of that prior involvement and treatment. We do not have imaging studies from [facility] to provide a comparison.

## 2013-02-03 ENCOUNTER — Other Ambulatory Visit: Payer: Self-pay | Admitting: Medical Oncology

## 2013-02-03 DIAGNOSIS — C9000 Multiple myeloma not having achieved remission: Secondary | ICD-10-CM

## 2013-02-03 MED ORDER — FENTANYL 25 MCG/HR TD PT72: 1.0000 | MEDICATED_PATCH | TRANSDERMAL | Status: DC

## 2013-02-07 ENCOUNTER — Other Ambulatory Visit: Payer: Self-pay | Admitting: *Deleted

## 2013-02-07 DIAGNOSIS — C9000 Multiple myeloma not having achieved remission: Secondary | ICD-10-CM

## 2013-02-07 NOTE — Telephone Encounter (Signed)
THIS REFILL REQUEST FOR REVLIMID WAS GIVEN TO DR.MURINSON'S NURSE, ROBIN BASS,RN. 

## 2013-02-08 MED ORDER — LENALIDOMIDE 15 MG PO CAPS: 15.0000 mg | ORAL_CAPSULE | Freq: Every day | ORAL | Status: DC

## 2013-02-08 NOTE — Addendum Note (Signed)
Addended by: Wandalee Ferdinand on: 02/08/2013 10:00 AM   Modules accepted: Orders

## 2013-02-10 ENCOUNTER — Ambulatory Visit (INDEPENDENT_AMBULATORY_CARE_PROVIDER_SITE_OTHER): Payer: Medicare Other | Admitting: Family Medicine

## 2013-02-10 ENCOUNTER — Encounter: Payer: Self-pay | Admitting: Family Medicine

## 2013-02-10 VITALS — BP 128/74 | HR 73 | Temp 97.8°F | Wt 216.8 lb

## 2013-02-10 DIAGNOSIS — L0390 Cellulitis, unspecified: Secondary | ICD-10-CM

## 2013-02-10 MED ORDER — DOXYCYCLINE HYCLATE 100 MG PO TABS: 100.0000 mg | ORAL_TABLET | Freq: Two times a day (BID) | ORAL | Status: DC

## 2013-02-10 NOTE — Progress Notes (Signed)
Known peripheral neuropathy.  Sore on the L 2nd toe medially, not a new lesion.  Recently with throbbing pain.  Red 2nd toe dorsally.  Sx started in the last few days.  No fevers.  On steroids and revlimid.   Meds, vitals, and allergies reviewed.   ROS: See HPI.  Otherwise, noncontributory.  nad L foot with normal inspection except as noted and normal DP pulse Small superficial area rubbed and chapped on the L 2nd toe medially- at friction point with 1st toe.  Doesn't probe to bone Dorsum on L 2nd toes with erythema that extends 3cm back from the proximal portion of the nail bed.  Not circumferential erythema.  Nail bed cap refill wnl.

## 2013-02-10 NOTE — Patient Instructions (Addendum)
Take oxycodone for pain, start the antibiotics today.  If worsening redness, go to the hospital.  Call the on call doc if needed.  Bedsole or Para March can recheck this next week.  Take care.

## 2013-02-12 DIAGNOSIS — L0390 Cellulitis, unspecified: Secondary | ICD-10-CM | POA: Insufficient documentation

## 2013-02-12 NOTE — Assessment & Plan Note (Signed)
Immunocompromised, but nontoxic.  The pressure changes medially may or may not be related.  Start doxy, f/u next week, sooner prn.  He agrees.  When cellulitis is resolved, pressure changes can be addressed.  That area doesn't appear infected.  He agrees.  To PCP as FYI.

## 2013-02-13 NOTE — Telephone Encounter (Signed)
RECEIVED A FAX FROM [facility] CONCERNING A CONFIRMATION OF PRESCRIPTION SHIPMENT FOR REVLIMID ON 02/10/13.

## 2013-02-14 ENCOUNTER — Encounter: Payer: Self-pay | Admitting: Family Medicine

## 2013-02-14 ENCOUNTER — Ambulatory Visit (INDEPENDENT_AMBULATORY_CARE_PROVIDER_SITE_OTHER): Payer: Medicare Other | Admitting: Family Medicine

## 2013-02-14 VITALS — BP 130/84 | HR 83 | Temp 97.9°F | Ht 72.0 in | Wt 216.2 lb

## 2013-02-14 DIAGNOSIS — L0390 Cellulitis, unspecified: Secondary | ICD-10-CM

## 2013-02-14 DIAGNOSIS — L0291 Cutaneous abscess, unspecified: Secondary | ICD-10-CM

## 2013-02-14 DIAGNOSIS — E781 Pure hyperglyceridemia: Secondary | ICD-10-CM

## 2013-02-14 DIAGNOSIS — I10 Essential (primary) hypertension: Secondary | ICD-10-CM

## 2013-02-14 NOTE — Assessment & Plan Note (Signed)
Well controlled. Continue current medication.  

## 2013-02-14 NOTE — Progress Notes (Signed)
  Subjective:    Patient ID: [name], male    DOB: 1945/09/10, 67 y.o.   MRN: [id number]  HPI 67 year old male seen by my partner Dr. Para March on 7/18 diagnosed with cellulitis on his 2nd toe dorsally.Marland Kitchen  He was started on doxycycline given his other health issues ( immunocompromised) and possible MRSA exposure at heath centers.  Today he reports the redness is better, no spread, decrease in pain.  No fever.   Review of Systems  Constitutional: Negative for fever and fatigue.  HENT: Negative for ear pain.   Eyes: Negative for pain.  Respiratory: Negative for shortness of breath.   Cardiovascular: Negative for chest pain, palpitations and leg swelling.  Gastrointestinal: Negative for abdominal pain.       Objective:   Physical Exam  Constitutional: Vital signs are normal. He appears well-developed and well-nourished.  HENT:  Head: Normocephalic.  Right Ear: Hearing normal.  Left Ear: Hearing normal.  Nose: Nose normal.  Mouth/Throat: Oropharynx is clear and moist and mucous membranes are normal.  Neck: Trachea normal. Carotid bruit is not present. No mass and no thyromegaly present.  Cardiovascular: Normal rate, regular rhythm and normal pulses.  Exam reveals no gallop, no distant heart sounds and no friction rub.   No murmur heard. No peripheral edema  Pulmonary/Chest: Effort normal and breath sounds normal. No respiratory distress.  Skin: Skin is warm, dry and intact.  pink tissue on dorsum of 2nd toe left foot, also macerated tissue and 0.3 mm hole on medial distal 2nd digit from rubbing on great toe and nail. No warmth or swelling, no pain.  No erythema, warmth spread up foot or in other toes.  Psychiatric: He has a normal mood and affect. His speech is normal and behavior is normal. Thought content normal.          Assessment & Plan:

## 2013-02-14 NOTE — Assessment & Plan Note (Signed)
Resolving

## 2013-02-14 NOTE — Assessment & Plan Note (Signed)
Due for re-eval. 

## 2013-02-14 NOTE — Patient Instructions (Addendum)
Complete all antibiotics. Call if fever or redness spreading. Return for fasting labs for cholesterol panel. When you are able schedule a medicare wellness appt.

## 2013-02-15 ENCOUNTER — Other Ambulatory Visit (INDEPENDENT_AMBULATORY_CARE_PROVIDER_SITE_OTHER): Payer: Medicare Other

## 2013-02-15 DIAGNOSIS — E781 Pure hyperglyceridemia: Secondary | ICD-10-CM

## 2013-02-15 DIAGNOSIS — Z13220 Encounter for screening for lipoid disorders: Secondary | ICD-10-CM

## 2013-02-15 LAB — LIPID PANEL
LDL Cholesterol: 97 mg/dL (ref 0–99)
Total CHOL/HDL Ratio: 4
Triglycerides: 158 mg/dL — ABNORMAL HIGH (ref 0.0–149.0)

## 2013-02-16 ENCOUNTER — Encounter: Payer: Self-pay | Admitting: *Deleted

## 2013-02-23 ENCOUNTER — Other Ambulatory Visit (HOSPITAL_BASED_OUTPATIENT_CLINIC_OR_DEPARTMENT_OTHER): Payer: Medicare Other | Admitting: Lab

## 2013-02-23 ENCOUNTER — Ambulatory Visit (HOSPITAL_BASED_OUTPATIENT_CLINIC_OR_DEPARTMENT_OTHER): Payer: Medicare Other

## 2013-02-23 ENCOUNTER — Telehealth: Payer: Self-pay | Admitting: Hematology and Oncology

## 2013-02-23 ENCOUNTER — Ambulatory Visit (HOSPITAL_BASED_OUTPATIENT_CLINIC_OR_DEPARTMENT_OTHER): Payer: Medicare Other | Admitting: Hematology and Oncology

## 2013-02-23 ENCOUNTER — Other Ambulatory Visit: Payer: Medicare Other | Admitting: Lab

## 2013-02-23 ENCOUNTER — Ambulatory Visit: Payer: Medicare Other

## 2013-02-23 VITALS — BP 153/86 | HR 96 | Temp 97.2°F | Resp 20 | Ht 72.0 in | Wt 214.3 lb

## 2013-02-23 DIAGNOSIS — C9000 Multiple myeloma not having achieved remission: Secondary | ICD-10-CM

## 2013-02-23 DIAGNOSIS — C61 Malignant neoplasm of prostate: Secondary | ICD-10-CM

## 2013-02-23 DIAGNOSIS — R198 Other specified symptoms and signs involving the digestive system and abdomen: Secondary | ICD-10-CM

## 2013-02-23 LAB — COMPREHENSIVE METABOLIC PANEL (CC13)
ALT: 21 U/L (ref 0–55)
Albumin: 3.5 g/dL (ref 3.5–5.0)
CO2: 20 mEq/L — ABNORMAL LOW (ref 22–29)
Calcium: 9.5 mg/dL (ref 8.4–10.4)
Chloride: 107 mEq/L (ref 98–109)
Creatinine: 1.4 mg/dL — ABNORMAL HIGH (ref 0.7–1.3)
Potassium: 3.9 mEq/L (ref 3.5–5.1)
Sodium: 140 mEq/L (ref 136–145)
Total Protein: 6.4 g/dL (ref 6.4–8.3)

## 2013-02-23 LAB — CBC WITH DIFFERENTIAL/PLATELET
Basophils Absolute: 0 10*3/uL (ref 0.0–0.1)
EOS%: 0 % (ref 0.0–7.0)
HGB: 13.1 g/dL (ref 13.0–17.1)
MCH: 37.8 pg — ABNORMAL HIGH (ref 27.2–33.4)
NEUT#: 6.5 10*3/uL (ref 1.5–6.5)
RDW: 14.6 % (ref 11.0–14.6)
lymph#: 0.7 10*3/uL — ABNORMAL LOW (ref 0.9–3.3)

## 2013-02-23 LAB — LACTATE DEHYDROGENASE (CC13): LDH: 159 U/L (ref 125–245)

## 2013-02-23 MED ORDER — HEPARIN SOD (PORK) LOCK FLUSH 100 UNIT/ML IV SOLN
500.0000 [IU] | Freq: Once | INTRAVENOUS | Status: AC
  Administered 2013-02-23: 500 [IU] via INTRAVENOUS
  Filled 2013-02-23: qty 5

## 2013-02-23 MED ORDER — ZOLEDRONIC ACID 4 MG/100ML IV SOLN
4.0000 mg | Freq: Once | INTRAVENOUS | Status: AC
  Administered 2013-02-23: 4 mg via INTRAVENOUS
  Filled 2013-02-23: qty 100

## 2013-02-23 MED ORDER — SODIUM CHLORIDE 0.9 % IJ SOLN
10.0000 mL | INTRAMUSCULAR | Status: DC | PRN
  Administered 2013-02-23: 10 mL via INTRAVENOUS
  Filled 2013-02-23: qty 10

## 2013-02-23 NOTE — Telephone Encounter (Signed)
gv and printed appt sched and avs for pt  °

## 2013-02-23 NOTE — Progress Notes (Signed)
CC:   Jeremiah Candle, MD [name], M.D. [name], MD [name], MD [name], M.D. [name], M.D.  PROBLEM LIST:  1. Kappa light chain multiple myeloma, ISS III with diagnosis established in December 2012 when the patient presented with marked anemia, thrombocytopenia and extensive bone marrow replacement with immature plasma cells/plasma blasts and circulating plasma cells in the peripheral blood. Bone marrow was carried out on 07/08/2011. In addition, the patient had severe renal insufficiency and hypercalcemia. He was requiring transfusions of platelets and red  cells. He appeared to have limited bone disease characterized by mild compression fractures of T12 and L3. A bone density scan done a couple months preceding the diagnosis had been normal. Results of cytogenetic analysis revealed the presence of 2 clonal cell lines. The 1st cell line was chromosomally normal (20% of cells). 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 There was a gain of chromosome 7 and a marker chromosome. 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 FISH studies showed loss of chromosome 4, loss of chromosome 14, loss of chromosome 13, and loss of chromosome 17, i.e. 4 P-, 14 Q-, 13 Q- and 17 P-. These findings would be consistent with abnormalities associated with multiple myeloma. The patient received Velcade, Cytoxan, and Decadron on a weekly basis from 07/10/2011 through 10/23/2011. The patient developed pain in the region of his right hip during the month of April. An MRI of the right hip at [facility] on 11/19/2011 showed a destructive lesion of the right pubic body and adjacent rami with associated soft tissue component. There was marked heterogeneous abnormal marrow signal throughout the visualized marrow. The patient also underwent an MRI of the lumbar  spine with and without IV contrast on 11/19/2011. Multiple compression deformities were noted, most severely at L3, where there was slight retropulsion resulting in moderate canal stenosis. An MRI of the right knee on 12/01/2011 showed no evidence of multiple myeloma; however, there was a horizontal medial meniscus tear involving the body and posterior horn and degenerative changes. These studies were carried out at [facility]. The patient underwent radiation treatments to the right hemipelvis. He received apparently 10 treatments at [facility] with some improvement in pain.  [name] then underwent high-dose chemotherapy on 11/23/2011, consisting of carmustine 600 mg IV. He received cytarabine 400 mg IV on  12/24/2011, 12/25/2011, 12/26/2011, and 12/27/2011. He received etoposide 300 mg IV daily from 12/24/2011 through 12/27/2011, four doses. He received melphalan 280 mg IV on 12/28/2011. He was scheduled to receive autologous stem cell reinfusion on 12/29/2011. The patient's initial hospitalization was from 12/23/2011 through 12/28/2011. He required readmission at [facility] from 01/06/2012 through 01/10/2012. He had developed fever, shaking chills, diarrhea, weakness, and hypotension. One out of 2 blood cultures grew out gram-positive cocci, felt to be staphylococcus from his Hickman catheter. He was treated with ceftazidime and vancomycin. Vancomycin was discontinued after a 10-day course on 01/15/2012.  The patient was found to have progressive disease in late September 2013 as  evidenced by an increase in his serum kappa light chains and serum  protein M spike. It was recommended that the patient start Revlimid 15  mg daily, 3 weeks on, 1 week off, and Decadron 40 mg weekly, in  conjunction with aspirin 81 mg daily. Those treatments started on  05/11/2012.  2. Pancytopenia at diagnosis secondary to  extensive marrow replacement by multiple myeloma.  3. Renal insufficiency, markedly improved since diagnosis.  4.  Hypercalcemia at presentation, now resolved following treatment.  5. Anemia which developed in mid January 2012 secondary to treatment  responsive to Aranesp.  6. History of prostate cancer dating back to September 2003, treated with robotic-assisted laparoscopic radical prostatectomy by Dr. [name].  The tumor was pathologic T3a N0 Mx.  Gleason score was 4+3=7 Tumor involved both lobes of the prostate with capsular extension and a positive margin focally at the left apex.  Pretreatment PSA was 4.87.  The patient did not receive postoperative radiation.  Recently, his PSA has been slowly rising.  PSA on 10/28/2012 was 0.32.  7. Hypertension.  8. Dyslipidemia.  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  10. Right-sided Port-A-Cath placement on 08/05/2011. This was subsequently removed when the patient was at [facility] in June 2013.  11. Kyphoplasty carried out by Dr. [name] on 10/13/2011  involving vertebral bodies T12, L2 and L3. Review of bone biopsy pathology from  kyphoplasties done on 10/13/2011 showed scattered plasma cells with no  kappa or lambda light chain restriction.  12. Destructive lesion of the right pubic body and adjacent rami with associated soft tissue component detected on MRI of the right hip on 11/19/2011 at [facility]. The patient received 10 radiation treatments at [facility] with improvement in pain.  13. Weight loss of approximately 35 pounds from May through June 2013.  The patient had regained all his weight by December 2013.  14. Left-sided Port-A-Cath was placed by Interventional Radiology on  07/29/2012.     MEDICATIONS:  Reviewed and recorded. Current Outpatient Prescriptions  Medication Sig Dispense Refill  . acyclovir (ZOVIRAX) 400 MG tablet Take 1 tablet (400 mg total) by mouth 2 (two) times daily.  14 tablet  0  . allopurinol (ZYLOPRIM) 100 MG tablet Take 1 tablet (100 mg total) by mouth daily.  90 tablet   3  . aspirin 81 MG tablet Take 81 mg by mouth daily.      Marland Kitchen dexamethasone (DECADRON) 4 MG tablet Take 10 tabs = 40 mg weekly  40 tablet  3  . doxycycline (VIBRA-TABS) 100 MG tablet Take 1 tablet (100 mg total) by mouth 2 (two) times daily.  20 tablet  0  . fentaNYL (DURAGESIC - DOSED MCG/HR) 25 MCG/HR Place 1 patch (25 mcg total) onto the skin every 3 (three) days.  10 patch  0  . gabapentin (NEURONTIN) 300 MG capsule Take 1 capsule (300 mg total) by mouth 4 (four) times daily.  360 capsule  2  . lenalidomide (REVLIMID) 15 MG capsule Take 1 capsule (15 mg total) by mouth daily. Take for 21 days then rest for 7 days  21 capsule  0  . lidocaine-prilocaine (EMLA) cream Apply topically as needed. Apply to port site one hour before treatment and cover with plastic wrap  30 g  2  . NONFORMULARY OR COMPOUNDED ITEM Baclofn/amitrp/ketamn cream topically to feet TID prn for neuropathy      . oxyCODONE (OXY IR/ROXICODONE) 5 MG immediate release tablet Take 1 tablet (5 mg total) by mouth every 6 (six) hours as needed for pain.  90 tablet  0  . oxyCODONE (OXYCONTIN) 10 MG 12 hr tablet Take 1 tablet (10 mg total) by mouth every 12 (twelve) hours as needed.  120 tablet  0  . pantoprazole (PROTONIX) 40 MG tablet Take 1 tablet (40 mg total) by  mouth daily.  90 tablet  3  . prochlorperazine (COMPAZINE) 10 MG tablet Take 10 mg by mouth every 6 (six) hours as needed.      . zolpidem (AMBIEN) 5 MG tablet Take 5 mg by mouth at bedtime as needed. For sleep       No current facility-administered medications for this visit.     TREATMENT PROGRAM: -Revlimid 15 mg daily, 3 weeks on, 1 week off. Revlimid was started  on 05/11/2012.   -Decadron dose is currently 20 mg weekly as of 08/10/2012.  Because of cushingoid effects, the Decadron dose will be decreased to 12 mg weekly starting on Wednesday, June 11th. Decadron was started at 40 mg weekly on 05/11/2012.  -Zometa 4 mg IV is being given every 2 months.  Zometa was  started on 09/02/2012.  Dental clearance was obtained.    IMMUNIZATIONS:  Pneumovax was given on 04/28/2011 at the [facility].  He had a flu shot on 04/13/2012.    SMOKING HISTORY: The patient had smoked 2 packs of cigarettes a day for  approximately 25 years but stopped smoking in 1998.     HISTORY:  Ganon Demasi was seen today for followup of his kappa light chain multiple myeloma diagnosed in December 2012.   He has continued to do extremely well.    Vedansh is taking Revlimid 3 weeks out of every 4.  He is currently taking Revlimid 15 mg daily.  This was started on 12/21/2012 and will conclude on 01/10/2013.  He was taking Decadron 20 mg every Wednesday; however, dr. Vedia Pereyra has decreased the dose to 12 mg because of cushingoid effects.  Avrom is also on Zometa 4 mg every 2 months.  His first treatment was on 09/02/2012 and again on 10/28/2012.  He is due for Zometa today.  His Port-A-Cath was flushed at that time.  He did receive dental clearance.  Dawud was last seen at [facility] by Dr. Barbaraann Boys in April.  He will have another appointment 6 months later in October.  He is also being followed by Dr. Laverle Patter for a rising PSA and he will see Dr. Laverle Patter in the next few weeks.    Blood pressure 153/86, pulse 96, temperature 97.2 F (36.2 C), temperature source Oral, resp. rate 20, height 6' (1.829 m), weight 214 lb 4.8 oz (97.206 kg). PHYSICAL EXAM:  General:  Tyran looks well.   There is no scleral icterus.  Mouth and pharynx are notable for a small lesion on the left buccal mucosa that was pointed out to me.  Apparently Climmie has had intermittent sores in his mouth.  He does not have thrush.  He does look a little cushingoid. There is no peripheral adenopathy palpable.  Heart and lungs are normal. There is a left-sided Port-A-Cath which was placed by Interventional Radiology on 07/29/2012.  Abdomen is benign with no organomegaly or masses palpable.  Extremities:  No  peripheral edema or clubbing.  He does have some bruising on his left hand.  He bruises with minimal trauma.  Neurologic exam is grossly normal.  Voice is a little raspy.  LABORATORY DATA:   CBC    Component Value Date/Time   WBC 7.8 02/23/2013 1210   WBC 7.6 07/29/2012 0742   RBC 3.47* 02/23/2013 1210   RBC 3.43* 07/29/2012 0742   RBC 2.48* 07/04/2011 0901   HGB 13.1 02/23/2013 1210   HGB 11.8* 07/29/2012 0742   HCT 38.2* 02/23/2013 1210   HCT 34.4* 07/29/2012 1610  PLT 99* 02/23/2013 1210   PLT 166 07/29/2012 0742   MCV 110.3* 02/23/2013 1210   MCV 100.3* 07/29/2012 0742   MCH 37.8* 02/23/2013 1210   MCH 34.4* 07/29/2012 0742   MCHC 34.3 02/23/2013 1210   MCHC 34.3 07/29/2012 0742   RDW 14.6 02/23/2013 1210   RDW 16.8* 07/29/2012 0742   LYMPHSABS 0.7* 02/23/2013 1210   LYMPHSABS 4.5* 07/05/2011 0700   MONOABS 0.5 02/23/2013 1210   MONOABS 4.7* 07/05/2011 0700   EOSABS 0.0 02/23/2013 1210   EOSABS 0.3 07/05/2011 0700   BASOSABS 0.0 02/23/2013 1210   BASOSABS 1.0* 07/05/2011 0700    Results for ANDERSSON, LARRABEE (MRN [id number]) as of 02/23/2013 13:35  Ref. Range 07/21/2012 11:18 09/01/2012 10:08 10/28/2012 08:36 12/29/2012 11:51  Albumin ELP Latest Range: 55.8-66.1 % 61.3 57.7    COMMENT (PROTEIN ELECTROPHOR) No range found * *    Alpha-1-Globulin Latest Range: 2.9-4.9 % 4.8 5.7 (H)    Alpha-2-Globulin Latest Range: 7.1-11.8 % 11.8 15.6 (H)    Beta Globulin Latest Range: 4.7-7.2 % 6.4 5.2    Beta 2 Latest Range: 3.2-6.5 % 4.3 4.6    Gamma Globulin Latest Range: 11.1-18.8 % 11.4 11.2    M-SPIKE, % No range found NOT DET NOT DET    SPE Interp. No range found * *    IgG (Immunoglobin G), Serum Latest Range: [phone] mg/dL 952 841 324 401 (L)  IgA Latest Range: 68-379 mg/dL 68 82 58 (L) 50 (L)  IgM, Serum Latest Range: 41-251 mg/dL 37 (L) 29 (L) 31 (L) 20 (L)  Total Protein, Serum Electrophoresis Latest Range: 6.0-8.3 g/dL 6.1 6.3  6.1  Kappa free light chain Latest Range: 0.33-1.94 mg/dL 0.27 2.53 6.64 4.03   Lambda Free Lght Chn Latest Range: 0.57-2.63 mg/dL 4.74 2.59 5.63 8.75  Kappa:Lambda Ratio Latest Range: 0.26-1.65  0.73 0.56 0.59 0.88    IMAGING STUDIES:  1. Two-view chest x-ray on 06/28/2011 shows some mild bronchitic  changes.  2. Metastatic bone survey on 07/04/2011 showed no lytic lesions in  the visualized axial or appendicular skeleton. There was mild  superior end-plate compression deformities at T12 and L3.  3. CT-guided left iliac bone marrow aspirate and core biopsy was  carried out on 07/08/2011.  4. MRI of the lumbar spine without IV contrast on 09/30/2011 showed  progressive compression fractures at L2, L3 and T12. There is  diffuse scattered osseous metastatic disease. There was lumbar  spondylosis and degenerative disk disease causing various degrees  of impingement at all levels between L2 and S1. Progressive  collapse of L3 was noted with 75% loss of vertebral body height,  whereas on the prior study of 07/04/2011 there was 20% loss of  height. There was 6 mm of bony retropulsion. There was  progressive loss of L2 vertebral body noted with 50% loss of height  whereas formerly it was at 20% loss. There is a 50-60% loss of  vertebral body height noted at T12 minimally increased from the  prior study with 2 mm of bony retropulsion. 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  5. Chest x-ray, 2 view, from 01/06/2012 carried out at [facility] showed  increased interstitial opacities, possibly representing edema or  infection.  6. MRI of the right hip carried out at [facility] on 04/07/2012 and compared with  the prior MRI of the right hip on 11/19/2011 showed interval development  of a linear area of abnormal signal seen through  the site of previous  soft tissue mass within the right superior pubic rami. Findings were  felt to represent a pathologic fracture most likely due to the  underlying tumor involvement and/or an element of underlying radiation   change. There was also re-demonstration of myelomatous metastatic  lesions seen within the right hemipelvis and right proximal femur.  Findings appear to be similar to the previous study. There was interval  improvement of the previously visualized soft tissue mass involving the  superior and inferior pubic rami on the right. There continued to be  mild cortical expansion and destruction within this region.  7. Port-A-Cath placement with ultrasound and fluoroscopic guidance was  carried out on 07/29/2012 by Interventional Radiology. The report  indicates that the catheter was placed on the right side through the  right internal jugular vein. However, I believe, the Port-A-Cath is  actually situated on the left side. 8. Chest x-ray, 2-view, from 10/11/2012 showed no active or acute cardiopulmonary disease.  There was a compression fracture of either T5 or T6 with a 40% anterior wedge compression, which may have progressed slightly since the previous study of 07/07/2011.    IMPRESSION AND PLAN:  Mr. Kluender continues to do well on the current treatment program.  As stated, he is still taking Revlimid 15 mg daily, roughly in the middle of his 3-week cycle.  The patient takes Decadron 12 mg every Wednesday.     He is going to receive Zometa 4 mg IV today and receives this every 2 months.  The patient will have a followup appoimnent with Dr. Laverle Patter in September and see Dr. Barbaraann Boys again in October.  The patient's last metastatic bone survey was on 01/24/2013 revealing new mixed lytic and sclerotic process involving the right pubic  body into the right superior pubic ramus, question healing fracture versus metastatic lesion; will get MR imaging of the pelvis for further evaluation.  We will have Andriy return in 1 months, at which time we will check CBC, chemistries, quantitative immunoglobulins, serum immunofixation electrophoresis, serum light chains.   Zachery Dakins, MD 02/23/2013  1:52 PM

## 2013-02-23 NOTE — Telephone Encounter (Signed)
Moved pt appts for lb/flush/CP1/tx to 12:15pm. lmonvm (cell) for pt re new time and then called home number and s/w pt re time change.

## 2013-02-24 ENCOUNTER — Ambulatory Visit (HOSPITAL_COMMUNITY)
Admission: RE | Admit: 2013-02-24 | Discharge: 2013-02-24 | Disposition: A | Discharge status: Home / Self Care | Payer: Medicare Other | Source: Ambulatory Visit | Attending: Hematology and Oncology | Admitting: Hematology and Oncology

## 2013-02-24 DIAGNOSIS — C9000 Multiple myeloma not having achieved remission: Secondary | ICD-10-CM | POA: Insufficient documentation

## 2013-02-24 DIAGNOSIS — M8448XA Pathological fracture, other site, initial encounter for fracture: Secondary | ICD-10-CM | POA: Insufficient documentation

## 2013-02-24 DIAGNOSIS — C61 Malignant neoplasm of prostate: Secondary | ICD-10-CM | POA: Insufficient documentation

## 2013-02-27 LAB — IMMUNOFIXATION ELECTROPHORESIS
IgA: 57 mg/dL — ABNORMAL LOW (ref 68–379)
IgG (Immunoglobin G), Serum: 645 mg/dL — ABNORMAL LOW (ref 650–1600)
Total Protein, Serum Electrophoresis: 6 g/dL (ref 6.0–8.3)

## 2013-02-27 LAB — KAPPA/LAMBDA LIGHT CHAINS: Kappa free light chain: 0.86 mg/dL (ref 0.33–1.94)

## 2013-03-01 ENCOUNTER — Other Ambulatory Visit: Payer: Self-pay

## 2013-03-02 ENCOUNTER — Encounter: Payer: Self-pay | Admitting: Family Medicine

## 2013-03-02 ENCOUNTER — Ambulatory Visit (INDEPENDENT_AMBULATORY_CARE_PROVIDER_SITE_OTHER): Payer: Medicare Other | Admitting: Family Medicine

## 2013-03-02 VITALS — BP 140/82 | HR 102 | Temp 97.6°F | Ht 72.0 in | Wt 213.0 lb

## 2013-03-02 DIAGNOSIS — G629 Polyneuropathy, unspecified: Secondary | ICD-10-CM | POA: Insufficient documentation

## 2013-03-02 DIAGNOSIS — M79609 Pain in unspecified limb: Secondary | ICD-10-CM

## 2013-03-02 DIAGNOSIS — G609 Hereditary and idiopathic neuropathy, unspecified: Secondary | ICD-10-CM

## 2013-03-02 DIAGNOSIS — M79674 Pain in right toe(s): Secondary | ICD-10-CM

## 2013-03-02 DIAGNOSIS — R7309 Other abnormal glucose: Secondary | ICD-10-CM

## 2013-03-02 DIAGNOSIS — C9000 Multiple myeloma not having achieved remission: Secondary | ICD-10-CM

## 2013-03-02 DIAGNOSIS — R739 Hyperglycemia, unspecified: Secondary | ICD-10-CM

## 2013-03-02 DIAGNOSIS — M79676 Pain in unspecified toe(s): Secondary | ICD-10-CM | POA: Insufficient documentation

## 2013-03-02 LAB — HEMOGLOBIN A1C: Hgb A1c MFr Bld: 5.4 % (ref 4.6–6.5)

## 2013-03-02 NOTE — Progress Notes (Signed)
  Subjective:    Patient ID: [name], male    DOB: [birth year], 67 y.o.   MRN: [id number]  HPI  67 year old multiple myeloma and peripheral neuropathy ( worsened with chemo) patient returns for recurrent issues following a cellulitis in let 4th distal toe. He was treated  in mid 01/2013 for cellulitis with doxycycline. He reports that  The redness never went away completely, toe is still painful over proximal joint, but not nearly as painful as in mid July. Pain occurs mostly at night. He has had resolution of macerated tissue on lateral toe using toe spreader device.   No history of DM.   Last CBG non fasting in nml range.      Review of Systems  Constitutional: Negative for fever and fatigue.  HENT: Negative for ear pain.   Eyes: Negative for pain.  Respiratory: Negative for shortness of breath.   Cardiovascular: Negative for chest pain.       Objective:   Physical Exam  Constitutional: Vital signs are normal. He appears well-developed and well-nourished.  HENT:  Head: Normocephalic.  Right Ear: Hearing normal.  Left Ear: Hearing normal.  Nose: Nose normal.  Mouth/Throat: Oropharynx is clear and moist and mucous membranes are normal.  Neck: Trachea normal. Carotid bruit is not present. No mass and no thyromegaly present.  Cardiovascular: Normal rate, regular rhythm and normal pulses.  Exam reveals no gallop, no distant heart sounds and no friction rub.   No murmur heard. No peripheral edema  Pulmonary/Chest: Effort normal and breath sounds normal. No respiratory distress.  Skin: Skin is warm, dry and intact.  pink tissue ( no warmth, no swelling) on 4th toe left foot,reslved  macerated tissue and  hole on medial distal 4th digit from rubbing on great toe and nail. Pain in 4th digit PIP joint to palpation  No erythema, warmth spread up foot or in other toes.  Psychiatric: He has a normal mood and affect. His speech is normal and behavior is normal. Thought content  normal.          Assessment & Plan:

## 2013-03-02 NOTE — Patient Instructions (Addendum)
Stop at front desk for referral.  Call Dr. Karel Jarvis to discuss MRI pelvis results ASAP.

## 2013-03-02 NOTE — Assessment & Plan Note (Signed)
Pt requested results of recent pelvis MRI done in follow up of abnormal bone scan. Discussed results, but asked that her call onc for further discussion to determine if ? 3 mm lesion, othe scatter lesions, new change or not.

## 2013-03-02 NOTE — Assessment & Plan Note (Signed)
Likely due to chemo, but will rule out DM with A1C. Blood sugars on CMETs have been nml but ? If fasting or not.

## 2013-03-07 ENCOUNTER — Other Ambulatory Visit: Payer: Self-pay | Admitting: *Deleted

## 2013-03-07 DIAGNOSIS — C9000 Multiple myeloma not having achieved remission: Secondary | ICD-10-CM

## 2013-03-07 MED ORDER — LENALIDOMIDE 15 MG PO CAPS: 15.0000 mg | ORAL_CAPSULE | Freq: Every day | ORAL | Status: DC

## 2013-03-07 NOTE — Telephone Encounter (Signed)
THIS REFILL REQUEST FOR REVLIMID WAS PLACED IN DR.GRNAFORTUNA'S ACTIVE WORK BOX.

## 2013-03-14 NOTE — Telephone Encounter (Signed)
RECEIVED A FAX FROM [employer] CONCERNING A CONFIRMATION OF PRESCRIPTION SHIPMENT FOR REVLIMID ON 03/13/13. 

## 2013-03-15 ENCOUNTER — Telehealth: Payer: Self-pay

## 2013-03-15 NOTE — Telephone Encounter (Signed)
Faxed MRI of 02/25/13 to Dr Barbaraann Boys with 1)note to compare to prior and to 2)contact pt and to 3)call us if she needs a disc.

## 2013-03-17 ENCOUNTER — Telehealth: Payer: Self-pay | Admitting: Hematology and Oncology

## 2013-03-17 NOTE — Telephone Encounter (Signed)
Moved 8/29 appts to 9/5. S/w pt re change re new d/t for lb/flush/CP1 9/5 @ 3:15pm.

## 2013-03-20 ENCOUNTER — Other Ambulatory Visit: Payer: Self-pay | Admitting: Medical Oncology

## 2013-03-20 ENCOUNTER — Encounter: Payer: Self-pay | Admitting: Medical Oncology

## 2013-03-20 DIAGNOSIS — C9000 Multiple myeloma not having achieved remission: Secondary | ICD-10-CM

## 2013-03-20 MED ORDER — FENTANYL 25 MCG/HR TD PT72: 1.0000 | MEDICATED_PATCH | TRANSDERMAL | Status: DC

## 2013-03-20 NOTE — Telephone Encounter (Signed)
Gretchen from [facility] called stating that they do not have a MRI of the pelvis to compare the most recent MRI. They have some hip films, bone density xrays that they can send [place]. I spoke with the pt and he states that he got radiation at [facility]. I asked him the name of the rad-onc MD he saw. He states that he will have to look it up. He is thinking this finding is not new and would like to know if we can send to the rad-onc Md to review. I told him I will make some calls once he gets me the name.

## 2013-03-23 ENCOUNTER — Telehealth: Payer: Self-pay | Admitting: Medical Oncology

## 2013-03-23 NOTE — Telephone Encounter (Signed)
Pt called to inform us that when he was here to see Dr. Karel Jarvis he had redness in his 4th distal toe. 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 He is currently taking 2 antibiotics. He was wondering if the zometa could have caused this. I explained that zometa cause osteonecrosis which is destruction of the bone. This usually is in the jaw. He has an appointment 03/31/13 and he will discuss with Dr. Iver Nestle.

## 2013-03-24 ENCOUNTER — Other Ambulatory Visit: Payer: Medicare Other | Admitting: Lab

## 2013-03-24 ENCOUNTER — Ambulatory Visit: Payer: Medicare Other

## 2013-03-31 ENCOUNTER — Ambulatory Visit (HOSPITAL_BASED_OUTPATIENT_CLINIC_OR_DEPARTMENT_OTHER): Payer: Medicare Other | Admitting: Internal Medicine

## 2013-03-31 ENCOUNTER — Ambulatory Visit: Payer: Medicare Other

## 2013-03-31 ENCOUNTER — Other Ambulatory Visit (HOSPITAL_BASED_OUTPATIENT_CLINIC_OR_DEPARTMENT_OTHER): Payer: Medicare Other | Admitting: Lab

## 2013-03-31 ENCOUNTER — Telehealth: Payer: Self-pay | Admitting: Internal Medicine

## 2013-03-31 VITALS — BP 134/78 | HR 79 | Temp 97.9°F | Resp 18 | Ht 72.0 in

## 2013-03-31 DIAGNOSIS — C801 Malignant (primary) neoplasm, unspecified: Secondary | ICD-10-CM

## 2013-03-31 DIAGNOSIS — Z8546 Personal history of malignant neoplasm of prostate: Secondary | ICD-10-CM

## 2013-03-31 DIAGNOSIS — L0390 Cellulitis, unspecified: Secondary | ICD-10-CM

## 2013-03-31 DIAGNOSIS — C9000 Multiple myeloma not having achieved remission: Secondary | ICD-10-CM

## 2013-03-31 DIAGNOSIS — C9001 Multiple myeloma in remission: Secondary | ICD-10-CM

## 2013-03-31 DIAGNOSIS — M549 Dorsalgia, unspecified: Secondary | ICD-10-CM

## 2013-03-31 DIAGNOSIS — M869 Osteomyelitis, unspecified: Secondary | ICD-10-CM

## 2013-03-31 DIAGNOSIS — C61 Malignant neoplasm of prostate: Secondary | ICD-10-CM

## 2013-03-31 DIAGNOSIS — N289 Disorder of kidney and ureter, unspecified: Secondary | ICD-10-CM

## 2013-03-31 DIAGNOSIS — D631 Anemia in chronic kidney disease: Secondary | ICD-10-CM

## 2013-03-31 DIAGNOSIS — M810 Age-related osteoporosis without current pathological fracture: Secondary | ICD-10-CM

## 2013-03-31 DIAGNOSIS — D696 Thrombocytopenia, unspecified: Secondary | ICD-10-CM

## 2013-03-31 LAB — COMPREHENSIVE METABOLIC PANEL (CC13)
AST: 16 U/L (ref 5–34)
Albumin: 3.1 g/dL — ABNORMAL LOW (ref 3.5–5.0)
Alkaline Phosphatase: 44 U/L (ref 40–150)
Potassium: 3.7 mEq/L (ref 3.5–5.1)
Sodium: 143 mEq/L (ref 136–145)
Total Bilirubin: 0.76 mg/dL (ref 0.20–1.20)
Total Protein: 5.5 g/dL — ABNORMAL LOW (ref 6.4–8.3)

## 2013-03-31 LAB — CBC WITH DIFFERENTIAL/PLATELET
BASO%: 0.2 % (ref 0.0–2.0)
EOS%: 5.4 % (ref 0.0–7.0)
MCH: 36.9 pg — ABNORMAL HIGH (ref 27.2–33.4)
MCHC: 34.7 g/dL (ref 32.0–36.0)
MCV: 106.5 fL — ABNORMAL HIGH (ref 79.3–98.0)
MONO%: 23.7 % — ABNORMAL HIGH (ref 0.0–14.0)
RBC: 3.09 10*6/uL — ABNORMAL LOW (ref 4.20–5.82)
RDW: 14.7 % — ABNORMAL HIGH (ref 11.0–14.6)
lymph#: 0.8 10*3/uL — ABNORMAL LOW (ref 0.9–3.3)

## 2013-03-31 MED ORDER — SODIUM CHLORIDE 0.9 % IJ SOLN
10.0000 mL | INTRAMUSCULAR | Status: DC | PRN
  Administered 2013-03-31: 10 mL via INTRAVENOUS
  Filled 2013-03-31: qty 10

## 2013-03-31 MED ORDER — HEPARIN SOD (PORK) LOCK FLUSH 100 UNIT/ML IV SOLN
500.0000 [IU] | Freq: Once | INTRAVENOUS | Status: AC
  Administered 2013-03-31: 500 [IU] via INTRAVENOUS
  Filled 2013-03-31: qty 5

## 2013-03-31 NOTE — Telephone Encounter (Signed)
gv pt appt schedule for September and October.  °

## 2013-04-01 NOTE — Progress Notes (Signed)
Hematology and Oncology Follow Up Visit  [name] [id number] 1945/11/28 67 y.o. 04/02/2013 7:16 AM [name], MD  Principle Diagnosis: Kappa light chain multiple myeloma, ISS III with diagnosis established in December 2012.  Prior Therapy:  1. Velcade, Cytoxan, and Decadron on a weekly basis from 07/10/2011 through 10/23/2011; 2. High-dose chemotherapy on 11/23/2011, consisting of carmustine 600 mg IV. He received cytarabine 400 mg IV on 12/24/2011, 12/25/2011, 12/26/2011, and 12/27/2011. He received etoposide 300 mg IV daily from 12/24/2011 through 12/27/2011, four doses. He received melphalan 280 mg IV on 12/28/2011.  3. He was scheduled to receive autologous stem cell reinfusion on 12/29/2011.  Current therapy: -Revlimid 15 mg daily, 3 weeks on, 1 week off. Revlimid was started on 05/11/2012.  Decadron dose was 20 mg weekly as of 08/10/2012.  Because of cushingoid effects, the Decadron dose was decreased to 12 mg weekly starting on Wednesday, June 11th. Decadron was started at 40 mg weekly on 05/11/2012. Adjunctive therapy with Zometa 4 mg IV is being given every 2 months.  Zometa was started on 09/02/2012.  Dental clearance was obtained.  He is on fentanyl 25 and oxycodone prn.   Interim History:  [name] was seen today for followup of his kappa light chain multiple myeloma diagnosed in December 2012.   He was last seen by Dr. Karel Jarvis on 02/23/2013.  He continues his Revlimid 3 weeks out of every 4. He finishes this cycle on Tuesday.  This was started on 12/21/2012 and will conclude on 01/10/2014.  He is taking decadron 12 mg because of cushingoid effects.  In addition, he is on zometa 4 mg every 2 months.  He reports that he has osteomyelitis of his left 2nd toe and this is being treated on antibiotics and a determination will be made if surgery will be required.  He also reports that his PSA is slowly rising and this may require future therapy by his urologist, Dr. Laverle Patter.  He is  scheduled to see Dr. Barbaraann Boys of [facility] in October. He remains active and reports backpain that he rates 5 out of 10 relieved with his fentanyl patch and oxycodones prn.  His bowel movements are regular.  He complains of intermittent sores in his mouth.   Problem list:  1. Kappa light chain multiple myeloma, ISS III with diagnosis established in December 2012 when the patient presented with marked anemia, thrombocytopenia and extensive bone marrow replacement with immature plasma cells/plasma blasts and circulating plasma cells in the peripheral blood. Bone marrow was carried out on 07/08/2011. In addition, the patient had severe renal insufficiency and hypercalcemia. He was requiring transfusions of platelets and red cells. He appeared to have limited bone disease characterized by mild compression fractures of T12 and L3. A bone density scan done a couple months preceding the diagnosis had been normal. Results of cytogenetic analysis revealed the presence of 2 clonal cell lines. The 1st cell line was chromosomally normal (20% of cells). 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 There was a gain of chromosome 7 and a marker chromosome. 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 FISH studies showed loss of chromosome 4, loss of chromosome 14, loss of chromosome 13, and loss of chromosome 17, i.e. 4 P-, 14 Q-, 13 Q- and 17 P-. These findings would be consistent with abnormalities associated with multiple myeloma. The patient  received Velcade, Cytoxan, and Decadron on a weekly basis from 07/10/2011 through 10/23/2011. The patient developed pain in the region of his right hip during the month of April. An MRI of the right hip at [facility] on 11/19/2011 showed a destructive lesion of the right pubic body and adjacent rami with associated soft tissue component. There was marked heterogeneous  abnormal marrow signal throughout the visualized marrow. The patient also underwent an MRI of the lumbar spine with and without IV contrast on 11/19/2011. Multiple compression deformities were noted, most severely at L3, where there was slight retropulsion resulting in moderate canal stenosis. An MRI of the right knee on 12/01/2011 showed no evidence of multiple myeloma; however, there was a horizontal medial meniscus tear involving the body and posterior horn and degenerative changes. These studies were carried out at [facility]. The patient underwent radiation treatments to the right hemipelvis. He received apparently 10 treatments at [facility] with some improvement in pain. Mr. Loiseau then underwent high-dose chemotherapy on 11/23/2011, consisting of carmustine 600 mg IV. He received cytarabine 400 mg IV on 12/24/2011, 12/25/2011, 12/26/2011, and 12/27/2011. He received etoposide 300 mg IV daily from 12/24/2011 through 12/27/2011, four doses. He received melphalan 280 mg IV on 12/28/2011. He was scheduled to receive autologous stem cell reinfusion on 12/29/2011. The patient's initial hospitalization was from 12/23/2011 through 12/28/2011. He required readmission at [facility] from 01/06/2012 through 01/10/2012. He had developed fever, shaking chills, diarrhea, weakness, and hypotension. One out of 2 blood cultures grew out gram-positive cocci, felt to be staphylococcus from his Hickman catheter. He was treated with ceftazidime and vancomycin. Vancomycin was discontinued after a 10-day course on 01/15/2012. The patient was found to have progressive disease in late September 2013 as evidenced by an increase in his serum kappa light chains and serum protein M spike. It was recommended that the patient start Revlimid 15 mg daily, 3 weeks on, 1 week off, and Decadron 40 mg weekly, in  conjunction with aspirin 81 mg daily. Those treatments started on 05/11/2012.  2. Pancytopenia at diagnosis secondary to extensive marrow replacement  by multiple myeloma.  3. Renal insufficiency, markedly improved since diagnosis.  4. Hypercalcemia at presentation, now resolved following treatment.  5. Anemia which developed in mid January 2012 secondary to treatment  responsive to Aranesp.  6. History of prostate cancer dating back to September 2003, treated with robotic-assisted laparoscopic radical prostatectomy by Dr. [name].  The tumor was pathologic T3a N0 Mx.  Gleason score was 4+3=7 Tumor involved both lobes of the prostate with capsular extension and a positive margin focally at the left apex.  Pretreatment PSA was 4.87.  The patient did not receive postoperative radiation.  Recently, his PSA has been slowly rising.  PSA on 10/28/2012 was 0.32.  7. Hypertension.  8. Dyslipidemia.  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  10. Right-sided Port-A-Cath placement on 08/05/2011. This was subsequently removed when the patient was at [facility] in June 2013.  11. Kyphoplasty carried out by Dr. [name] on 10/13/2011 involving vertebral bodies T12, L2 and L3. Review of bone biopsy pathology from kyphoplasties done on 10/13/2011 showed scattered plasma cells with no kappa or lambda light chain restriction.  12. Destructive lesion of the right pubic body and adjacent rami with associated soft tissue component detected on MRI of the right hip on 11/19/2011 at [facility]. The patient received 10 radiation treatments at [facility] with improvement in pain.  13. Weight loss of approximately 35 pounds from  May through June 2013. The patient had regained all his weight by December 2013.  14. Left-sided Port-A-Cath was placed by Interventional Radiology on 07/29/2012.   Medications: I have reviewed the patient's current medications.  Current Outpatient Prescriptions  Medication Sig Dispense Refill  . acyclovir (ZOVIRAX) 400 MG tablet Take 1 tablet (400 mg total) by mouth 2 (two) times daily.  14 tablet  0  .  allopurinol (ZYLOPRIM) 100 MG tablet Take 1 tablet (100 mg total) by mouth daily.  90 tablet  3  . aspirin 81 MG tablet Take 81 mg by mouth daily.      . clindamycin (CLEOCIN) 150 MG capsule Take 150 mg by mouth 3 (three) times daily.      Marland Kitchen dexamethasone (DECADRON) 4 MG tablet Take 4 mg by mouth once a week. Take 3 tablets=12 mg weekly      . fentaNYL (DURAGESIC - DOSED MCG/HR) 25 MCG/HR patch Place 1 patch (25 mcg total) onto the skin every 3 (three) days.  10 patch  0  . lenalidomide (REVLIMID) 15 MG capsule Take 1 capsule (15 mg total) by mouth daily. Take for 21 days then rest for 7 days  21 capsule  0  . levofloxacin (LEVAQUIN) 750 MG tablet Take 750 mg by mouth daily.      Marland Kitchen lidocaine-prilocaine (EMLA) cream Apply topically as needed. Apply to port site one hour before treatment and cover with plastic wrap  30 g  2  . NONFORMULARY OR COMPOUNDED ITEM Baclofn/amitrp/ketamn cream topically to feet TID prn for neuropathy      . pantoprazole (PROTONIX) 40 MG tablet Take 1 tablet (40 mg total) by mouth daily.  90 tablet  3  . prochlorperazine (COMPAZINE) 10 MG tablet Take 10 mg by mouth every 6 (six) hours as needed.       No current facility-administered medications for this visit.     Allergies:  Allergies  Allergen Reactions  . Celebrex [Celecoxib] Nausea Only  . Meloxicam Itching  . Percocet [Oxycodone-Acetaminophen] Itching  . Vicodin [Hydrocodone-Acetaminophen] Itching    Past Medical History, Surgical history, Social history, and Family History were reviewed and updated.  IMMUNIZATIONS:  Pneumovax was given on 04/28/2011 at the [facility].  He had a flu shot on 04/13/2012.   SMOKING HISTORY: The patient had smoked 2 packs of cigarettes a day for  approximately 25 years but stopped smoking in 1998.  Review of Systems: Constitutional:  Negative for fever, chills, night sweats, anorexia, weight loss, pain. Cardiovascular: no chest pain or dyspnea on  exertion Respiratory: no cough, shortness of breath, or wheezing Neurological: no TIA or stroke symptoms Dermatological: positive for erythrema around distal 2nd Left toe ENT: negative for - epistaxis Skin: Negative. Gastrointestinal: no abdominal pain, change in bowel habits, or black or bloody stools Genito-Urinary: no dysuria, trouble voiding, or hematuria Hematological and Lymphatic: negative for bleeding problems Musculoskeletal: positive for - joint pain, joint stiffness and pain in back - lower Remaining ROS negative.  Physical Exam: Blood pressure 134/78, pulse 79, temperature 97.9 F (36.6 C), temperature source Oral, resp. rate 18, height 6' (1.829 m). ECOG: 2 General appearance: alert, cooperative, appears stated age and no distress HEENT: PERRLa; EOMi; Small lesion on the left buccal mucosa without signs of purulence or infection.  Head: Normocephalic, without obvious abnormality, atraumatic Neck: no adenopathy, supple, symmetrical, trachea midline and thyroid not enlarged, symmetric, no tenderness/mass/nodules Lymph nodes: Cervical, supraclavicular, and axillary nodes normal. Heart:regular rate and rhythm, S1, S2  normal, no murmur, click, rub or gallop Lung:chest clear, no wheezing, rales, normal symmetric air entry Abdomin: soft, non-tender, without masses or organomegaly EXT:No peripheral edema Neuro: Non focal.    Lab Results: Lab Results  Component Value Date   WBC 4.8 03/31/2013   HGB 11.4* 03/31/2013   HCT 32.9* 03/31/2013   MCV 106.5* 03/31/2013   PLT 65* 03/31/2013     Chemistry      Component Value Date/Time   NA 140 02/23/2013 1210   NA 136 02/25/2012 1541   K 3.9 02/23/2013 1210   K 3.7 02/25/2012 1541   CL 110* 12/29/2012 1151   CL 101 02/25/2012 1541   CO2 20* 02/23/2013 1210   CO2 27 02/25/2012 1541   BUN 29.0* 02/23/2013 1210   BUN 20 02/25/2012 1541   CREATININE 1.4* 02/23/2013 1210   CREATININE 1.10 02/25/2012 1541      Component Value Date/Time   CALCIUM 9.5  02/23/2013 1210   CALCIUM 9.8 02/25/2012 1541   ALKPHOS 56 02/23/2013 1210   ALKPHOS 78 02/25/2012 1541   AST 14 02/23/2013 1210   AST 18 02/25/2012 1541   ALT 21 02/23/2013 1210   ALT 13 02/25/2012 1541   BILITOT 0.79 02/23/2013 1210   BILITOT 0.4 02/25/2012 1541     Results for CORDELRO, GAUTREAU (MRN [id number]) as of 02/23/2013 13:35  Ref. Range 07/21/2012 11:18 09/01/2012 10:08 10/28/2012 08:36 12/29/2012 11:51  Albumin ELP Latest Range: 55.8-66.1 % 61.3 57.7    COMMENT (PROTEIN ELECTROPHOR) No range found * *    Alpha-1-Globulin Latest Range: 2.9-4.9 % 4.8 5.7 (H)    Alpha-2-Globulin Latest Range: 7.1-11.8 % 11.8 15.6 (H)    Beta Globulin Latest Range: 4.7-7.2 % 6.4 5.2    Beta 2 Latest Range: 3.2-6.5 % 4.3 4.6    Gamma Globulin Latest Range: 11.1-18.8 % 11.4 11.2    M-SPIKE, % No range found NOT DET NOT DET    SPE Interp. No range found * *    IgG (Immunoglobin G), Serum Latest Range: [phone] mg/dL [phone] (L)  IgA Latest Range: 68-379 mg/dL 68 82 58 (L) 50 (L)  IgM, Serum Latest Range: 41-251 mg/dL 37 (L) 29 (L) 31 (L) 20 (L)  Total Protein, Serum Electrophoresis Latest Range: 6.0-8.3 g/dL 6.1 6.3  6.1  Kappa free light chain Latest Range: 0.33-1.94 mg/dL 9.56 2.13 0.86 5.78  Lambda Free Lght Chn Latest Range: 0.57-2.63 mg/dL 4.69 6.29 5.28 4.13  Kappa:Lambda Ratio Latest Range: 0.26-1.65  0.73 0.56 0.59 0.88   Radiological Studies: IMAGING STUDIES:  1. Two-view chest x-ray on 06/28/2011 shows some mild bronchitic changes.  2. Metastatic bone survey on 07/04/2011 showed no lytic lesions in the visualized axial or appendicular skeleton. There was mild  superior end-plate compression deformities at T12 and L3.  3. CT-guided left iliac bone marrow aspirate and core biopsy was carried out on 07/08/2011.  4. MRI of the lumbar spine without IV contrast on 09/30/2011 showed progressive compression fractures at L2, L3 and T12. There is  diffuse scattered osseous metastatic disease. There was  lumbar spondylosis and degenerative disk disease causing various degrees  of impingement at all levels between L2 and S1. Progressive collapse of L3 was noted with 75% loss of vertebral body height,  whereas on the prior study of 07/04/2011 there was 20% loss of height. There was 6 mm of bony retropulsion. There was  progressive loss of L2 vertebral body noted with 50% loss of height whereas  formerly it was at 20% loss. There is a 50-60% loss of  vertebral body height noted at T12 minimally increased from the prior study with 2 mm of bony retropulsion. 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  5. Chest x-ray, 2 view, from 01/06/2012 carried out at [facility] showed increased interstitial opacities, possibly representing edema or  infection.  6. MRI of the right hip carried out at [facility] on 04/07/2012 and compared with the prior MRI of the right hip on 11/19/2011 showed interval development of a linear area of abnormal signal seen through the site of previous soft tissue mass within the right superior pubic rami. Findings were felt to represent a pathologic fracture most likely due to the underlying tumor involvement and/or an element of underlying radiation change. There was also re-demonstration of myelomatous metastatic lesions seen within the right hemipelvis and right proximal femur. Findings appear to be similar to the previous study. There was interval improvement of the previously visualized soft tissue mass involving the superior and inferior pubic rami on the right. There continued to be mild cortical expansion and destruction within this region.  7. Port-A-Cath placement with ultrasound and fluoroscopic guidance was carried out on 07/29/2012 by Interventional Radiology. The report indicates that the catheter was placed on the right side through the right internal jugular vein. However, I believe, the Port-A-Cath is actually situated on the left side. 8. Chest x-ray, 2-view, from  10/11/2012 showed no active or acute cardiopulmonary disease.  There was a compression fracture of either T5 or T6 with a 40% anterior wedge compression, which may have progressed slightly since the previous study of 07/07/2011. 9. Bone Survey (01/24/2013). Osseous demineralization. Prior spinal augmentation procedures at T12, L2, and L3. Old fracture posterior right eighth rib. Degenerative disc facet disease changes cervical spine. Chronic anterior height loss T5. New mixed lytic and sclerotic process involving the right pubic body into the right superior pubic ramus, question healing fracture versus metastatic lesion; correlation with patient history and any recent prior outside imaging the patient may have. May also consider MR imaging of the pelvis for further evaluation. 10. MRI of the Pelvis (02/24/2013). Myeloma lesion of the right pubic body with adjacent pathologic fractures. Numerous small myeloma lesions throughout the pelvic bones, proximal femurs, and distal lumbar spine.  Impression and Plan: 1. Kappa light chain multiple myeloma, ISS III.  -- Treatment. Continue Revlimid 15 mg daily, 3 weeks on, 1 week off, and Decadron 12 mg weekly inconjunction with aspirin 81 mg daily.  Decadron dose modified secondary to cushiongoid side effect.  We hope to compare recent scans with radiology, lab results and determine if progressive disease.  If so, we will consider clinical trial enrollment at [facility] versus  bortezemib  Or other salvage therapies.  We will discuss with Dr. Barbaraann Boys at [facility].  -- Adjuntive therapy.  He is on zometa q 2 months.  Those treatments started on 05/11/2012 and his last dose was 02/23/2013. We will hold his next dose pending resolution of his ongoing osteomyelitis.   --Complications including pathological fracture of right pelvis s/p XRT and secondary backpain, renal insufficiency, hypercalcemia (now resolved).  His pain level remains above 5 despite current pain regiment.  We  will increase his fentanyl patch to 37.5 and reassess on next visit.   2. Osteomyelitis. Currently, being treated with antibiotics.  Likely secondary to ongoing Multiple myeloma.  Await immunoglobulins.    3. History of prostate cancer dating back to  September 2003, treated with robotic-assisted laparoscopic radical prostatectomy by Dr. [name].  The tumor was pathologic T3a N0 Mx.  Gleason score was 4+3=7 Tumor involved both lobes of the prostate with capsular extension and a positive margin focally at the left apex.  Pretreatment PSA was 4.87.  The patient did not receive postoperative radiation.  Recently, his PSA has been slowly rising.  He will follow up with urology for treatment options.   4. RTC in 1 month.  We will obtain CBC, CMP, quantitative immunoglobulins, serum light chain and immunofixation electrophoresis.   Spent more than half the time coordinating care.    Sergey Ishler, MD 9/7/20147:16 AM

## 2013-04-03 ENCOUNTER — Other Ambulatory Visit: Payer: Self-pay | Admitting: Medical Oncology

## 2013-04-03 ENCOUNTER — Encounter: Payer: Self-pay | Admitting: Medical Oncology

## 2013-04-03 NOTE — Progress Notes (Signed)
I called [name] in radiology and requested the MRI of pelvis and bone survey be placed on a disc. Pt will pick up and take with him to his next visit with Dr. Barbaraann Boys.

## 2013-04-03 NOTE — Telephone Encounter (Signed)
I called pt and left him a message to inform him that the disc with his x-rays will ready for pick up to take to Dr. Barbaraann Boys. I also asked him to call me regarding his duragesic patch. Due to increase in pain Dr.Chism is going to increase his dose. I can mail his prescription for him.

## 2013-04-04 ENCOUNTER — Encounter: Payer: Self-pay | Admitting: Medical Oncology

## 2013-04-04 ENCOUNTER — Other Ambulatory Visit: Payer: Self-pay | Admitting: *Deleted

## 2013-04-04 ENCOUNTER — Other Ambulatory Visit: Payer: Self-pay | Admitting: Medical Oncology

## 2013-04-04 LAB — SPEP & IFE WITH QIG
Albumin ELP: 62.1 % (ref 55.8–66.1)
Alpha-1-Globulin: 5.2 % — ABNORMAL HIGH (ref 2.9–4.9)
Beta 2: 4.4 % (ref 3.2–6.5)
IgM, Serum: 24 mg/dL — ABNORMAL LOW (ref 41–251)
Total Protein, Serum Electrophoresis: 4.1 g/dL — ABNORMAL LOW (ref 6.0–8.3)

## 2013-04-04 LAB — KAPPA/LAMBDA LIGHT CHAINS: Kappa:Lambda Ratio: 0.29 (ref 0.26–1.65)

## 2013-04-04 MED ORDER — FENTANYL 12 MCG/HR TD PT72: MEDICATED_PATCH | TRANSDERMAL | Status: DC

## 2013-04-04 NOTE — Telephone Encounter (Signed)
THIS REFILL REQUEST FOR REVLIMID WAS GIVEN TO DR.CHISM'S NURSE, ROBIN BASS,RN. 

## 2013-04-05 ENCOUNTER — Telehealth: Payer: Self-pay | Admitting: Internal Medicine

## 2013-04-05 ENCOUNTER — Telehealth: Payer: Self-pay | Admitting: *Deleted

## 2013-04-05 DIAGNOSIS — C9000 Multiple myeloma not having achieved remission: Secondary | ICD-10-CM

## 2013-04-05 MED ORDER — LENALIDOMIDE 15 MG PO CAPS: 15.0000 mg | ORAL_CAPSULE | Freq: Every day | ORAL | Status: DC

## 2013-04-05 NOTE — Telephone Encounter (Signed)
Discussed with [name] his labs results which indicate Kapp of 0.31 down from 0.86; Lambda free light chain of 1.08 (0.87). Ratio of 0.29 (0.99).  In review of his labs, we will continue with Revlimid and decadron.  He will also f/u w Dr. Donnie Coffin on 10/30.  He will continue to have lab checks.

## 2013-04-05 NOTE — Telephone Encounter (Signed)
Spoke with pt on cell phone and instructed pt to call [employer] to take survey for Revlimid.Jovita Gamma pt phone number to [employer].

## 2013-04-10 ENCOUNTER — Other Ambulatory Visit: Payer: Medicare Other | Admitting: Lab

## 2013-04-11 NOTE — Telephone Encounter (Signed)
RECEIVED A FAX FROM [employer] CONCERNING A CONFIRMATION OF PRESCRIPTION SHIPMENT FOR REVLIMID ON 04/10/13

## 2013-04-12 ENCOUNTER — Other Ambulatory Visit (HOSPITAL_BASED_OUTPATIENT_CLINIC_OR_DEPARTMENT_OTHER): Payer: Medicare Other

## 2013-04-12 DIAGNOSIS — C9000 Multiple myeloma not having achieved remission: Secondary | ICD-10-CM

## 2013-04-12 DIAGNOSIS — D696 Thrombocytopenia, unspecified: Secondary | ICD-10-CM

## 2013-04-12 LAB — CBC WITH DIFFERENTIAL/PLATELET
BASO%: 1.8 % (ref 0.0–2.0)
Eosinophils Absolute: 0.6 10*3/uL — ABNORMAL HIGH (ref 0.0–0.5)
HCT: 33.6 % — ABNORMAL LOW (ref 38.4–49.9)
MCHC: 35.7 g/dL (ref 32.0–36.0)
MONO#: 0.9 10*3/uL (ref 0.1–0.9)
NEUT#: 2.1 10*3/uL (ref 1.5–6.5)
NEUT%: 45.7 % (ref 39.0–75.0)
Platelets: 64 10*3/uL — ABNORMAL LOW (ref 140–400)
WBC: 4.6 10*3/uL (ref 4.0–10.3)
lymph#: 1 10*3/uL (ref 0.9–3.3)

## 2013-04-13 ENCOUNTER — Telehealth: Payer: Self-pay

## 2013-04-13 NOTE — Telephone Encounter (Signed)
Pt called stating another MD wanted him to have bone scan on L toe. This entails a technesium injection. Pt was asking if this is advisable with his myeloma and kidney function. Will confer with Dr Rosie Fate and call pt back.

## 2013-04-17 ENCOUNTER — Telehealth: Payer: Self-pay | Admitting: Medical Oncology

## 2013-04-17 NOTE — Telephone Encounter (Signed)
Pt called and states that he is going to have a triphasic bone scan to evaluate his osteomyelitis in his toe. He is worried about the injection for the scan. I asked Dr. Rosie Fate and he does not see any reason the pt can not have the study. Pt voiced understanding. He states that he may have to have the end of his toe amputated if does not improve. There is concern the infection has spread into the bone.

## 2013-04-19 ENCOUNTER — Ambulatory Visit: Payer: Self-pay | Admitting: Podiatry

## 2013-04-19 LAB — UIFE/LIGHT CHAINS/TP QN, 24-HR UR
Free Kappa Lt Chains,Ur: 0.43 mg/dL (ref 0.14–2.42)
Free Kappa/Lambda Ratio: 8.6 ratio (ref 2.04–10.37)
Free Lambda Excretion/Day: 0.98 mg/d
Free Lambda Lt Chains,Ur: 0.05 mg/dL (ref 0.02–0.67)
Free Lt Chn Excr Rate: 8.39 mg/d
Time: 24 hours
Total Protein, Urine-Ur/day: 20 mg/d (ref 10–140)
Total Protein, Urine: 1 mg/dL

## 2013-04-21 ENCOUNTER — Other Ambulatory Visit: Payer: Self-pay | Admitting: *Deleted

## 2013-04-21 ENCOUNTER — Encounter: Payer: Self-pay | Admitting: Internal Medicine

## 2013-04-24 ENCOUNTER — Other Ambulatory Visit: Payer: Self-pay | Admitting: *Deleted

## 2013-04-24 MED ORDER — FENTANYL 25 MCG/HR TD PT72: 1.0000 | MEDICATED_PATCH | TRANSDERMAL | Status: DC

## 2013-04-25 ENCOUNTER — Other Ambulatory Visit: Payer: Self-pay | Admitting: Internal Medicine

## 2013-04-25 ENCOUNTER — Other Ambulatory Visit: Payer: Self-pay | Admitting: Medical Oncology

## 2013-04-25 DIAGNOSIS — C9000 Multiple myeloma not having achieved remission: Secondary | ICD-10-CM

## 2013-04-25 MED ORDER — FENTANYL 25 MCG/HR TD PT72: 1.0000 | MEDICATED_PATCH | TRANSDERMAL | Status: DC

## 2013-04-28 ENCOUNTER — Telehealth: Payer: Self-pay | Admitting: *Deleted

## 2013-04-28 ENCOUNTER — Ambulatory Visit (HOSPITAL_BASED_OUTPATIENT_CLINIC_OR_DEPARTMENT_OTHER): Payer: Medicare Other

## 2013-04-28 ENCOUNTER — Other Ambulatory Visit: Payer: Self-pay | Admitting: Internal Medicine

## 2013-04-28 ENCOUNTER — Ambulatory Visit: Payer: Medicare Other

## 2013-04-28 ENCOUNTER — Telehealth: Payer: Self-pay | Admitting: Internal Medicine

## 2013-04-28 ENCOUNTER — Other Ambulatory Visit (HOSPITAL_BASED_OUTPATIENT_CLINIC_OR_DEPARTMENT_OTHER): Payer: Medicare Other | Admitting: Lab

## 2013-04-28 ENCOUNTER — Ambulatory Visit (HOSPITAL_BASED_OUTPATIENT_CLINIC_OR_DEPARTMENT_OTHER): Payer: Medicare Other | Admitting: Internal Medicine

## 2013-04-28 VITALS — BP 129/81 | HR 76 | Temp 97.0°F | Resp 18 | Ht 72.0 in | Wt 212.3 lb

## 2013-04-28 DIAGNOSIS — L0390 Cellulitis, unspecified: Secondary | ICD-10-CM

## 2013-04-28 DIAGNOSIS — C9000 Multiple myeloma not having achieved remission: Secondary | ICD-10-CM

## 2013-04-28 DIAGNOSIS — C801 Malignant (primary) neoplasm, unspecified: Secondary | ICD-10-CM

## 2013-04-28 DIAGNOSIS — D696 Thrombocytopenia, unspecified: Secondary | ICD-10-CM

## 2013-04-28 DIAGNOSIS — Z8546 Personal history of malignant neoplasm of prostate: Secondary | ICD-10-CM

## 2013-04-28 LAB — CBC WITH DIFFERENTIAL/PLATELET
Basophils Absolute: 0 10*3/uL (ref 0.0–0.1)
HCT: 33.4 % — ABNORMAL LOW (ref 38.4–49.9)
HGB: 11.7 g/dL — ABNORMAL LOW (ref 13.0–17.1)
LYMPH%: 13.6 % — ABNORMAL LOW (ref 14.0–49.0)
MCH: 38.5 pg — ABNORMAL HIGH (ref 27.2–33.4)
MONO#: 1.1 10*3/uL — ABNORMAL HIGH (ref 0.1–0.9)
NEUT%: 63.8 % (ref 39.0–75.0)
Platelets: 54 10*3/uL — ABNORMAL LOW (ref 140–400)
WBC: 4.9 10*3/uL (ref 4.0–10.3)
lymph#: 0.7 10*3/uL — ABNORMAL LOW (ref 0.9–3.3)

## 2013-04-28 LAB — COMPREHENSIVE METABOLIC PANEL (CC13)
ALT: 27 U/L (ref 0–55)
AST: 17 U/L (ref 5–34)
Albumin: 3.3 g/dL — ABNORMAL LOW (ref 3.5–5.0)
Alkaline Phosphatase: 52 U/L (ref 40–150)
Chloride: 108 mEq/L (ref 98–109)
Glucose: 131 mg/dl (ref 70–140)
Potassium: 4 mEq/L (ref 3.5–5.1)
Sodium: 141 mEq/L (ref 136–145)
Total Bilirubin: 0.63 mg/dL (ref 0.20–1.20)
Total Protein: 5.9 g/dL — ABNORMAL LOW (ref 6.4–8.3)

## 2013-04-28 MED ORDER — SODIUM CHLORIDE 0.9 % IJ SOLN
10.0000 mL | INTRAMUSCULAR | Status: DC | PRN
  Administered 2013-04-28: 10 mL via INTRAVENOUS
  Filled 2013-04-28: qty 10

## 2013-04-28 MED ORDER — HEPARIN SOD (PORK) LOCK FLUSH 100 UNIT/ML IV SOLN
500.0000 [IU] | Freq: Once | INTRAVENOUS | Status: AC
  Administered 2013-04-28: 500 [IU] via INTRAVENOUS
  Filled 2013-04-28: qty 5

## 2013-04-28 NOTE — Telephone Encounter (Signed)
gv and printed appt sched and avs for pt for OCT. °

## 2013-04-28 NOTE — Telephone Encounter (Signed)
Per staff message and POF I have scheduled appts.  JMW  

## 2013-04-30 NOTE — Progress Notes (Signed)
Hematology and Oncology Follow Up Visit  [name] [id number] 1946-05-21 67 y.o. 04/28/2013 7:07 PM Amy Ermalene Searing, MD  CHIEF COMPLAINT:  Multiple Myeloma  Principle Diagnosis: Kappa light chain multiple myeloma, ISS III with diagnosis established in December 2012.  Prior Therapy:  1. Velcade, Cytoxan, and Decadron on a weekly basis from 07/10/2011 through 10/23/2011; 2. High-dose chemotherapy on 11/23/2011, consisting of carmustine 600 mg IV. He received cytarabine 400 mg IV on 12/24/2011, 12/25/2011, 12/26/2011, and 12/27/2011. He received etoposide 300 mg IV daily from 12/24/2011 through 12/27/2011, four doses. He received melphalan 280 mg IV on 12/28/2011.  3. He was scheduled to receive autologous stem cell reinfusion on 12/29/2011.  Current therapy: -Revlimid 15 mg daily, 3 weeks on, 1 week off. Revlimid was started on 05/11/2012.  Decadron dose was 20 mg weekly as of 08/10/2012.  Because of cushingoid effects, the Decadron dose was decreased to 12 mg weekly starting on Wednesday, June 11th. Decadron was started at 40 mg weekly on 05/11/2012. Adjunctive therapy with Zometa 4 mg IV is being given every 2 months.  Zometa was started on 09/02/2012.  Dental clearance was obtained.  He is on fentanyl 25 and oxycodone prn.   Interim History:  [name] was seen today for followup of his kappa light chain multiple myeloma diagnosed in December 2012.   He was last seen by me  on 03/31/2013.  He continues his Revlimid 3 weeks out of every 4. This was started on 12/21/2012 and will conclude on 01/10/2014.  He is taking decadron 12 mg because of cushingoid effects.   In addition, he is on zometa 4 mg every 2 months (was placed on hold due to current osteomyelitis).  He reports that he has osteomyelitis of his left 2nd toe and now has completed treatment with a course of antibiotics.  He had a technetium scan of his foot that demonstrates continued osteo.  However, he reports much improvement in  his toe.  He also reports that his PSA is slowly rising and this may require future therapy by his urologist, Dr. Laverle Patter.  He is scheduled to see Dr. Barbaraann Boys of [facility] later this month (05/25/2013). He remains active and reports backpain that he rates 2 out of 10 relieved with his fentanyl patch (increased to 37.5 mcg q 72 hours) and oxycodones prn.  His bowel movements are regular.  He still complains of intermittent sores in his mouth. He denies any recent hospitalizations or emergency room visits.    Problem list:  1. Kappa light chain multiple myeloma, ISS III with diagnosis established in December 2012 when the patient presented with marked anemia, thrombocytopenia and extensive bone marrow replacement with immature plasma cells/plasma blasts and circulating plasma cells in the peripheral blood. Bone marrow was carried out on 07/08/2011. In addition, the patient had severe renal insufficiency and hypercalcemia. He was requiring transfusions of platelets and red cells. He appeared to have limited bone disease characterized by mild compression fractures of T12 and L3. A bone density scan done a couple months preceding the diagnosis had been normal. Results of cytogenetic analysis revealed the presence of 2 clonal cell lines. The 1st cell line was chromosomally normal (20% of cells). 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 There was a gain of chromosome 7 and a marker chromosome. 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 FISH  studies showed loss of chromosome 4, loss of chromosome 14, loss of chromosome 13, and loss of chromosome 17, i.e. 4 P-, 14 Q-, 13 Q- and 17 P-. These findings would be consistent with abnormalities associated with multiple myeloma. The patient received Velcade, Cytoxan, and Decadron on a weekly basis from 07/10/2011 through 10/23/2011. The patient  developed pain in the region of his right hip during the month of April. An MRI of the right hip at [facility] on 11/19/2011 showed a destructive lesion of the right pubic body and adjacent rami with associated soft tissue component. There was marked heterogeneous abnormal marrow signal throughout the visualized marrow. The patient also underwent an MRI of the lumbar spine with and without IV contrast on 11/19/2011. Multiple compression deformities were noted, most severely at L3, where there was slight retropulsion resulting in moderate canal stenosis. An MRI of the right knee on 12/01/2011 showed no evidence of multiple myeloma; however, there was a horizontal medial meniscus tear involving the body and posterior horn and degenerative changes. These studies were carried out at [facility]. The patient underwent radiation treatments to the right hemipelvis. He received apparently 10 treatments at [facility] with some improvement in pain. [name] then underwent high-dose chemotherapy on 11/23/2011, consisting of carmustine 600 mg IV. He received cytarabine 400 mg IV on 12/24/2011, 12/25/2011, 12/26/2011, and 12/27/2011. He received etoposide 300 mg IV daily from 12/24/2011 through 12/27/2011, four doses. He received melphalan 280 mg IV on 12/28/2011. He was scheduled to receive autologous stem cell reinfusion on 12/29/2011. The patient's initial hospitalization was from 12/23/2011 through 12/28/2011. He required readmission at [facility] from 01/06/2012 through 01/10/2012. He had developed fever, shaking chills, diarrhea, weakness, and hypotension. One out of 2 blood cultures grew out gram-positive cocci, felt to be staphylococcus from his Hickman catheter. He was treated with ceftazidime and vancomycin. Vancomycin was discontinued after a 10-day course on 01/15/2012. The patient was found to have progressive disease in late September 2013 as evidenced by an increase in his serum kappa light chains and serum protein M spike. It was  recommended that the patient start Revlimid 15 mg daily, 3 weeks on, 1 week off, and Decadron 40 mg weekly, in  conjunction with aspirin 81 mg daily. Those treatments started on 05/11/2012.  2. Pancytopenia at diagnosis secondary to extensive marrow replacement by multiple myeloma.  3. Renal insufficiency, markedly improved since diagnosis.  4. Hypercalcemia at presentation, now resolved following treatment.  5. Anemia which developed in mid January 2012 secondary to treatment responsive to Aranesp.  6. History of prostate cancer dating back to September 2003, treated with robotic-assisted laparoscopic radical prostatectomy by Dr. [name].  The tumor was pathologic T3a N0 Mx.  Gleason score was 4+3=7 Tumor involved both lobes of the prostate with capsular extension and a positive margin focally at the left apex.  Pretreatment PSA was 4.87.  The patient did not receive postoperative radiation.  Recently, his PSA has been slowly rising.  PSA on 10/28/2012 was 0.32.  7. Hypertension.  8. Dyslipidemia.  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  10. Right-sided Port-A-Cath placement on 08/05/2011. This was subsequently removed when the patient was at [facility] in June 2013.  11. Kyphoplasty carried out by Dr. [name] on 10/13/2011 involving vertebral bodies T12, L2 and L3. Review of bone biopsy pathology from kyphoplasties done on 10/13/2011 showed scattered plasma cells with no kappa or lambda light chain restriction.  12. Destructive lesion of the right pubic  body and adjacent rami with associated soft tissue component detected on MRI of the right hip on 11/19/2011 at [facility]. The patient received 10 radiation treatments at [facility] with improvement in pain.  13. Weight loss of approximately 35 pounds from May through June 2013. The patient had regained all his weight by December 2013.  14. Left-sided Port-A-Cath was placed by Interventional Radiology on  07/29/2012.   Medications: I have reviewed the patient's current medications.  Current Outpatient Prescriptions  Medication Sig Dispense Refill  . acyclovir (ZOVIRAX) 400 MG tablet Take 1 tablet (400 mg total) by mouth 2 (two) times daily.  14 tablet  0  . allopurinol (ZYLOPRIM) 100 MG tablet Take 1 tablet (100 mg total) by mouth daily.  90 tablet  3  . aspirin 81 MG tablet Take 81 mg by mouth daily.      . fentaNYL (DURAGESIC - DOSED MCG/HR) 12 MCG/HR Use one 12.87mcg patch  with one 25 mcg patch to = 37.24mcg and change every 72 hours.  10 patch  0  . fentaNYL (DURAGESIC - DOSED MCG/HR) 25 MCG/HR patch Place 1 patch (25 mcg total) onto the skin every 3 (three) days. Use with at 12.49mcg patch to = 37.39mcg and change every 72 hours.  10 patch  0  . lenalidomide (REVLIMID) 15 MG capsule Take 1 capsule (15 mg total) by mouth daily. Take for 21 days then rest for 7 days  21 capsule  0  . lidocaine-prilocaine (EMLA) cream Apply topically as needed. Apply to port site one hour before treatment and cover with plastic wrap  30 g  2  . NONFORMULARY OR COMPOUNDED ITEM Baclofn/amitrp/ketamn cream topically to feet TID prn for neuropathy      . pantoprazole (PROTONIX) 40 MG tablet Take 1 tablet (40 mg total) by mouth daily.  90 tablet  3  . prochlorperazine (COMPAZINE) 10 MG tablet Take 10 mg by mouth every 6 (six) hours as needed.       No current facility-administered medications for this visit.   Allergies:  Allergies  Allergen Reactions  . Celebrex [Celecoxib] Nausea Only  . Meloxicam Itching  . Percocet [Oxycodone-Acetaminophen] Itching  . Vicodin [Hydrocodone-Acetaminophen] Itching    Past Medical History, Surgical history, Social history, and Family History were reviewed and updated.  IMMUNIZATIONS:  Pneumovax was given on 04/28/2011 at the [facility].  He had a flu shot on 04/13/2012.   SMOKING HISTORY: The patient had smoked 2 packs of cigarettes a day for approximately 25 years  but stopped smoking in 1998.  Review of Systems: Constitutional:  Negative for fever, chills, night sweats, anorexia, weight loss, pain. Cardiovascular: no chest pain or dyspnea on exertion Respiratory: no cough, shortness of breath, or wheezing Neurological: no TIA or stroke symptoms Dermatological: positive for erythrema around distal 2nd Left toe ENT: negative for - epistaxis Skin: Negative. Gastrointestinal: no abdominal pain, change in bowel habits, or black or bloody stools Genito-Urinary: no dysuria, trouble voiding, or hematuria Hematological and Lymphatic: negative for bleeding problems Musculoskeletal: positive for - joint pain, joint stiffness and pain in back - lower Remaining ROS negative.  Physical Exam: BP 129/81  Pulse 76  Temp(Src) 97 F (36.1 C) (Oral)  Resp 18  Ht 6' (1.829 m)  Wt 212 lb 4.8 oz (96.299 kg)  BMI 28.79 kg/m2  SpO2 100% ECOG: 1 General appearance: alert, cooperative, appears stated age and no distress HEENT: PERRLa; EOMi;  Head: Normocephalic, without obvious abnormality, atraumatic Neck: no adenopathy, supple,  symmetrical, trachea midline and thyroid not enlarged, symmetric, no tenderness/mass/nodules Lymph nodes: Cervical, supraclavicular, and axillary nodes normal. Heart:regular rate and rhythm, S1, S2 normal, no murmur, click, rub or gallop Lung:chest clear, no wheezing, rales, normal symmetric air entry Abdomin: soft, non-tender, without masses or organomegaly EXT:No peripheral edema Neuro: Non focal.    Lab Results: Lab Results  Component Value Date   WBC 4.9 04/28/2013   HGB 11.7* 04/28/2013   HCT 33.4* 04/28/2013   MCV 110.1* 04/28/2013   PLT 54* 04/28/2013     Chemistry      Component Value Date/Time   NA 141 04/28/2013 1003   NA 136 02/25/2012 1541   K 4.0 04/28/2013 1003   K 3.7 02/25/2012 1541   CL 110* 12/29/2012 1151   CL 101 02/25/2012 1541   CO2 23 04/28/2013 1003   CO2 27 02/25/2012 1541   BUN 30.7* 04/28/2013 1003   BUN 20  02/25/2012 1541   CREATININE 1.5* 04/28/2013 1003   CREATININE 1.10 02/25/2012 1541      Component Value Date/Time   CALCIUM 9.1 04/28/2013 1003   CALCIUM 9.8 02/25/2012 1541   ALKPHOS 52 04/28/2013 1003   ALKPHOS 78 02/25/2012 1541   AST 17 04/28/2013 1003   AST 18 02/25/2012 1541   ALT 27 04/28/2013 1003   ALT 13 02/25/2012 1541   BILITOT 0.63 04/28/2013 1003   BILITOT 0.4 02/25/2012 1541     Results for WORTH, KOBER (MRN [id number]) as of 04/30/2013 19:23  Ref. Range 12/29/2012 11:51 02/23/2013 12:10 03/31/2013 15:22 04/28/2013 10:03  IgG (Immunoglobin G), Serum Latest Range: 806 063 9577 mg/dL 086 (L) 578 (L) 469 (L) 543 (L)  IgA Latest Range: 68-379 mg/dL 50 (L) 57 (L) 34 (L) 36 (L)  IgM, Serum Latest Range: 41-251 mg/dL 20 (L) 16 (L) 24 (L) 17 (L)  Total Protein, Serum Electrophoresis Latest Range: 6.0-8.3 g/dL 6.1 6.0 4.1 (L)   Kappa free light chain Latest Range: 0.33-1.94 mg/dL 6.29 5.28 4.13 (L)   Lambda Free Lght Chn Latest Range: 0.57-2.63 mg/dL 2.44 0.10 2.72    IMAGING STUDIES:  1. Two-view chest x-ray on 06/28/2011 shows some mild bronchitic changes.  2. Metastatic bone survey on 07/04/2011 showed no lytic lesions in the visualized axial or appendicular skeleton. There was mild superior end-plate compression deformities at T12 and L3.  3. CT-guided left iliac bone marrow aspirate and core biopsy was carried out on 07/08/2011.  4. MRI of the lumbar spine without IV contrast on 09/30/2011 showed progressive compression fractures at L2, L3 and T12. There is diffuse scattered osseous metastatic disease. There was lumbar spondylosis and degenerative disk disease causing various degrees of impingement at all levels between L2 and S1. Progressive collapse of L3 was noted with 75% loss of vertebral body height, whereas on the prior study of 07/04/2011 there was 20% loss of height. There was 6 mm of bony retropulsion. There was progressive loss of L2 vertebral body noted with 50% loss of height whereas  formerly it was at 20% loss. There is a 50-60% loss of vertebral body height noted at T12 minimally increased from the prior study with 2 mm of bony retropulsion. There was a 2.3 cm hemangioma present at the L1 vertebral body.  5. Chest x-ray, 2 view, from 01/06/2012 carried out at [facility] showed increased interstitial opacities, possibly representing edema or  infection.  6. MRI of the right hip carried out at [facility] on 04/07/2012 and compared with the prior MRI of the right  hip on 11/19/2011 showed interval development of a linear area of abnormal signal seen through the site of previous soft tissue mass within the right superior pubic rami. Findings were felt to represent a pathologic fracture most likely due to the underlying tumor involvement and/or an element of underlying radiation change. There was also re-demonstration of myelomatous metastatic lesions seen within the right hemipelvis and right proximal femur. Findings appear to be similar to the previous study. There was interval improvement of the previously visualized soft tissue mass involving the superior and inferior pubic rami on the right. There continued to be mild cortical expansion and destruction within this region.  7. Port-A-Cath placement with ultrasound and fluoroscopic guidance was carried out on 07/29/2012 by Interventional Radiology. The report indicates that the catheter was placed on the right side through the right internal jugular vein. However, I believe, the Port-A-Cath is actually situated on the left side. 8. Chest x-ray, 2-view, from 10/11/2012 showed no active or acute cardiopulmonary disease.  There was a compression fracture of either T5 or T6 with a 40% anterior wedge compression, which may have progressed slightly since the previous study of 07/07/2011. 9. Bone Survey (01/24/2013). Osseous demineralization. Prior spinal augmentation procedures at T12, L2, and L3. Old fracture posterior right eighth rib. Degenerative disc  facet disease changes cervical spine. Chronic anterior height loss T5. New mixed lytic and sclerotic process involving the right pubic body into the right superior pubic ramus, question healing fracture versus metastatic lesion; correlation with patient history and any recent prior outside imaging the patient may have. May also consider MR imaging of the pelvis for further evaluation. 10. MRI of the Pelvis (02/24/2013). Myeloma lesion of the right pubic body with adjacent pathologic fractures. Numerous small myeloma lesions throughout the pelvic bones, proximal femurs, and distal lumbar spine.  Impression and Plan: 1. Kappa light chain multiple myeloma, ISS III.  -- Treatment. Continue Revlimid 15 mg daily, 3 weeks on, 1 week off, and Decadron 12 mg weekly inconjunction with aspirin 81 mg daily.  Decadron dose modified secondary to cushiongoid side effect.  His kappa chains appear very low and clinically he is doing well.  We will continue current dose and check CBCs weekly and hold his revlimid if his plt count is less than 30K; then, we will resume it at 10 mg daily, 3 weeks on, 1 week off.   We reviewed his nuclear scan of his toe, he will discuss with his foot doctor whether additional intravenous antibiotics are warranted.  He reports improvement but still have erythema around the area.    -- Adjuntive therapy.  He is on zometa q 2 months.  Those treatments started on 05/11/2012 and his last dose was 02/23/2013. We will hold his next dose pending resolution of his ongoing osteomyelitis.   --Complications including pathological fracture of right pelvis s/p XRT and secondary backpain, renal insufficiency, hypercalcemia (now resolved).  His pain level remains above 5 despite current pain regiment.  We increased his fentanyl patch to 37.5 and he reports his pain is much improved.   2. Osteomyelitis. Currently,off antibiotics.  Nuclear scan consistent with probable osteomyelitis.   Likely secondary to  ongoing Multiple myeloma.  Low immunoglobulins. I am not sure if he will benefit from IVIG 400 mg/kg/dose every 3 weeks given his low levels.   3. History of prostate cancer dating back to September 2003, treated with robotic-assisted laparoscopic radical prostatectomy by Dr. [name].  The tumor was pathologic T3a N0 Mx.  Gleason score was 4+3=7 Tumor involved both lobes of the prostate with capsular extension and a positive margin focally at the left apex.  Pretreatment PSA was 4.87.  The patient did not receive postoperative radiation.  Recently, his PSA has been slowly rising.  He will follow up with urology for treatment options.   4. RTC in 1 month.  We will obtain CBC, CMP, quantitative immunoglobulins, serum light chain and immunofixation electrophoresis.  Check CBC weekly.   Spent more than half the time coordinating care.    Liela Rylee, MD 04/28/2013 7:07 pm

## 2013-05-01 LAB — BETA 2 MICROGLOBULIN, SERUM: Beta-2 Microglobulin: 2.74 mg/L — ABNORMAL HIGH (ref 1.01–1.73)

## 2013-05-01 LAB — KAPPA/LAMBDA LIGHT CHAINS: Kappa:Lambda Ratio: 0.31 (ref 0.26–1.65)

## 2013-05-02 ENCOUNTER — Telehealth: Payer: Self-pay | Admitting: *Deleted

## 2013-05-02 ENCOUNTER — Other Ambulatory Visit: Payer: Self-pay | Admitting: *Deleted

## 2013-05-02 DIAGNOSIS — C9000 Multiple myeloma not having achieved remission: Secondary | ICD-10-CM

## 2013-05-02 MED ORDER — LENALIDOMIDE 15 MG PO CAPS: 15.0000 mg | ORAL_CAPSULE | Freq: Every day | ORAL | Status: DC

## 2013-05-02 NOTE — Telephone Encounter (Signed)
Spoke with pt on cell phone and gave pt phone number of [employer] to complete survey for Revlimid refill.

## 2013-05-02 NOTE — Telephone Encounter (Signed)
THIS REFILL REQUEST FOR REVLIMID WAS GIVEN TO DR.CHISM'S NURSE, THU BRAY,RN.

## 2013-05-03 ENCOUNTER — Other Ambulatory Visit: Payer: Self-pay | Admitting: Medical Oncology

## 2013-05-04 NOTE — Telephone Encounter (Signed)
RECEIVED A FAX FROM [employer] CONCERNING A CONFIRMATION OF PRESCRIPTION SHIPMENT FOR REVLIMID ON 05/03/13.

## 2013-05-08 ENCOUNTER — Ambulatory Visit (INDEPENDENT_AMBULATORY_CARE_PROVIDER_SITE_OTHER): Payer: Medicare Other | Admitting: Podiatry

## 2013-05-08 ENCOUNTER — Other Ambulatory Visit (HOSPITAL_BASED_OUTPATIENT_CLINIC_OR_DEPARTMENT_OTHER): Payer: Medicare Other | Admitting: Lab

## 2013-05-08 ENCOUNTER — Encounter: Payer: Self-pay | Admitting: Podiatry

## 2013-05-08 VITALS — BP 89/46 | HR 72 | Resp 16 | Ht 75.0 in | Wt 212.0 lb

## 2013-05-08 DIAGNOSIS — M869 Osteomyelitis, unspecified: Secondary | ICD-10-CM

## 2013-05-08 DIAGNOSIS — L0390 Cellulitis, unspecified: Secondary | ICD-10-CM

## 2013-05-08 DIAGNOSIS — C9000 Multiple myeloma not having achieved remission: Secondary | ICD-10-CM

## 2013-05-08 LAB — CBC WITH DIFFERENTIAL/PLATELET
Basophils Absolute: 0 10*3/uL (ref 0.0–0.1)
EOS%: 10.4 % — ABNORMAL HIGH (ref 0.0–7.0)
Eosinophils Absolute: 0.6 10*3/uL — ABNORMAL HIGH (ref 0.0–0.5)
HCT: 34.2 % — ABNORMAL LOW (ref 38.4–49.9)
HGB: 12.1 g/dL — ABNORMAL LOW (ref 13.0–17.1)
LYMPH%: 22.1 % (ref 14.0–49.0)
MCV: 105.6 fL — ABNORMAL HIGH (ref 79.3–98.0)
MONO%: 14.4 % — ABNORMAL HIGH (ref 0.0–14.0)
NEUT#: 2.9 10*3/uL (ref 1.5–6.5)
NEUT%: 52.4 % (ref 39.0–75.0)
Platelets: 74 10*3/uL — ABNORMAL LOW (ref 140–400)
RDW: 14.3 % (ref 11.0–14.6)

## 2013-05-08 LAB — TECHNOLOGIST REVIEW

## 2013-05-08 NOTE — Patient Instructions (Signed)
Follow up with me if the second toe becomes red and swollen more than it presently is. Continue current regimen of antitbiotics.

## 2013-05-08 NOTE — Progress Notes (Signed)
Jung presents today for followup of the second digit of his left foot. We recently had bone scan performed which did demonstrate from one particular radiologist a reactive arthritis of the second digit of the left foot. However another radiologist read it as osteomyelitis of the second toe specifically because of the ulceration that was present. [place] has been continuing to take his antibiotics states the toe feels much better and looks normal in comparison to what it did. He states that the wound is gone to heal quite nicely and he continues to wear his toe spacers daily.  Objective: Vital signs are stable he is alert oriented x3. Pulses are strongly palpable to the left lower extremity. This is the first time that I seen the second digit with no erythema and no edema. The ulceration is going to help 100%.  Assessment: Rule out osteomyelitis/reactive arthritis second digit left foot.  Plan: Continue all antibiotics at this point continue placed padding to the toe daily. And continue so daily. He is to followup with me on a when necessary basis should this occur.

## 2013-05-22 ENCOUNTER — Other Ambulatory Visit: Payer: Self-pay

## 2013-05-22 ENCOUNTER — Telehealth: Payer: Self-pay

## 2013-05-22 ENCOUNTER — Telehealth: Payer: Self-pay | Admitting: Internal Medicine

## 2013-05-22 NOTE — Telephone Encounter (Signed)
Pt called and r/s appt from October to [facility] 2014 lab and MD

## 2013-05-22 NOTE — Telephone Encounter (Signed)
Sircharles called earlier about his having an appt at [facility] on Friday and an appt with Dr Barbaraann Boys on Thurs. Dr Rosie Fate stated it was OK for the [facility] appt to be postponed for a month. POF and call sent to schedulers, [name] was informed of this and stated she would pass it on to [place].

## 2013-05-26 ENCOUNTER — Other Ambulatory Visit: Payer: Medicare Other | Admitting: Lab

## 2013-05-26 ENCOUNTER — Ambulatory Visit: Payer: Medicare Other

## 2013-05-30 ENCOUNTER — Other Ambulatory Visit: Payer: Self-pay | Admitting: *Deleted

## 2013-05-30 DIAGNOSIS — C9000 Multiple myeloma not having achieved remission: Secondary | ICD-10-CM

## 2013-05-30 MED ORDER — LENALIDOMIDE 15 MG PO CAPS: 15.0000 mg | ORAL_CAPSULE | Freq: Every day | ORAL | Status: DC

## 2013-05-30 MED ORDER — FENTANYL 12 MCG/HR TD PT72: MEDICATED_PATCH | TRANSDERMAL | Status: DC

## 2013-05-30 MED ORDER — FENTANYL 25 MCG/HR TD PT72: 25.0000 ug | MEDICATED_PATCH | TRANSDERMAL | Status: DC

## 2013-05-30 NOTE — Addendum Note (Signed)
Addended by: Arvilla Meres on: 05/30/2013 03:17 PM   Modules accepted: Orders

## 2013-05-30 NOTE — Addendum Note (Signed)
Addended by: Arvilla Meres on: 05/30/2013 03:05 PM   Modules accepted: Orders

## 2013-05-30 NOTE — Telephone Encounter (Signed)
THIS REFILL REQUEST FOR REVLIMID WAS GIVEN TO DR.CHISM'S NURSE, KATHI BUYCK,RN.

## 2013-06-02 NOTE — Telephone Encounter (Signed)
RECEIVED A FAX FROM [employer] CONCERNING A CONFIRMATION OF PRESCRIPTION SHIPMENT FOR REVLIMID ON 06/01/13.

## 2013-06-15 ENCOUNTER — Telehealth: Payer: Self-pay | Admitting: Medical Oncology

## 2013-06-15 ENCOUNTER — Other Ambulatory Visit: Payer: Self-pay | Admitting: Medical Oncology

## 2013-06-15 MED ORDER — ACYCLOVIR 400 MG PO TABS: 400.0000 mg | ORAL_TABLET | Freq: Two times a day (BID) | ORAL | Status: DC

## 2013-06-15 NOTE — Telephone Encounter (Signed)
Pt called asking if we can refill his acyclovir. He also wanted me to remind Dr.Chism of the multiple myeloma meeting at [facility] this week-end. Dr.Chism notified.

## 2013-06-23 ENCOUNTER — Ambulatory Visit (HOSPITAL_BASED_OUTPATIENT_CLINIC_OR_DEPARTMENT_OTHER): Payer: Medicare Other

## 2013-06-23 ENCOUNTER — Ambulatory Visit (HOSPITAL_BASED_OUTPATIENT_CLINIC_OR_DEPARTMENT_OTHER): Payer: Medicare Other | Admitting: Internal Medicine

## 2013-06-23 ENCOUNTER — Other Ambulatory Visit: Payer: Self-pay | Admitting: Internal Medicine

## 2013-06-23 ENCOUNTER — Other Ambulatory Visit: Payer: Self-pay | Admitting: Medical Oncology

## 2013-06-23 ENCOUNTER — Other Ambulatory Visit (HOSPITAL_BASED_OUTPATIENT_CLINIC_OR_DEPARTMENT_OTHER): Payer: Medicare Other | Admitting: Lab

## 2013-06-23 ENCOUNTER — Ambulatory Visit: Payer: Medicare Other

## 2013-06-23 ENCOUNTER — Telehealth: Payer: Self-pay | Admitting: Internal Medicine

## 2013-06-23 VITALS — BP 143/71 | HR 67 | Temp 97.8°F | Resp 18 | Ht 75.0 in | Wt 219.9 lb

## 2013-06-23 DIAGNOSIS — M810 Age-related osteoporosis without current pathological fracture: Secondary | ICD-10-CM

## 2013-06-23 DIAGNOSIS — C9000 Multiple myeloma not having achieved remission: Secondary | ICD-10-CM

## 2013-06-23 DIAGNOSIS — G629 Polyneuropathy, unspecified: Secondary | ICD-10-CM

## 2013-06-23 DIAGNOSIS — M869 Osteomyelitis, unspecified: Secondary | ICD-10-CM

## 2013-06-23 DIAGNOSIS — Z95828 Presence of other vascular implants and grafts: Secondary | ICD-10-CM

## 2013-06-23 DIAGNOSIS — D631 Anemia in chronic kidney disease: Secondary | ICD-10-CM

## 2013-06-23 DIAGNOSIS — Z8546 Personal history of malignant neoplasm of prostate: Secondary | ICD-10-CM

## 2013-06-23 DIAGNOSIS — C61 Malignant neoplasm of prostate: Secondary | ICD-10-CM

## 2013-06-23 LAB — CBC WITH DIFFERENTIAL/PLATELET
Basophils Absolute: 0 10*3/uL (ref 0.0–0.1)
EOS%: 14.1 % — ABNORMAL HIGH (ref 0.0–7.0)
Eosinophils Absolute: 0.7 10*3/uL — ABNORMAL HIGH (ref 0.0–0.5)
HCT: 32.7 % — ABNORMAL LOW (ref 38.4–49.9)
HGB: 11.3 g/dL — ABNORMAL LOW (ref 13.0–17.1)
LYMPH%: 12.6 % — ABNORMAL LOW (ref 14.0–49.0)
MCH: 37.5 pg — ABNORMAL HIGH (ref 27.2–33.4)
MCHC: 34.6 g/dL (ref 32.0–36.0)
MCV: 108.6 fL — ABNORMAL HIGH (ref 79.3–98.0)
NEUT#: 2.6 10*3/uL (ref 1.5–6.5)
NEUT%: 48.5 % (ref 39.0–75.0)
RDW: 15 % — ABNORMAL HIGH (ref 11.0–14.6)
lymph#: 0.7 10*3/uL — ABNORMAL LOW (ref 0.9–3.3)
nRBC: 0 % (ref 0–0)

## 2013-06-23 LAB — COMPREHENSIVE METABOLIC PANEL
ALT: 28 U/L (ref 0–53)
Albumin: 3.4 g/dL — ABNORMAL LOW (ref 3.5–5.2)
BUN: 26 mg/dL — ABNORMAL HIGH (ref 6–23)
CO2: 22 mEq/L (ref 19–32)
Glucose, Bld: 88 mg/dL (ref 70–99)
Potassium: 3.5 mEq/L (ref 3.5–5.3)
Sodium: 141 mEq/L (ref 135–145)
Total Bilirubin: 0.4 mg/dL (ref 0.3–1.2)
Total Protein: 5.4 g/dL — ABNORMAL LOW (ref 6.0–8.3)

## 2013-06-23 MED ORDER — SODIUM CHLORIDE 0.9 % IV SOLN
INTRAVENOUS | Status: DC
  Administered 2013-06-23: 16:00:00 via INTRAVENOUS

## 2013-06-23 MED ORDER — SODIUM CHLORIDE 0.9 % IJ SOLN
10.0000 mL | INTRAMUSCULAR | Status: DC | PRN
  Administered 2013-06-23: 10 mL via INTRAVENOUS
  Filled 2013-06-23: qty 10

## 2013-06-23 MED ORDER — FENTANYL 50 MCG/HR TD PT72: 50.0000 ug | MEDICATED_PATCH | TRANSDERMAL | Status: DC

## 2013-06-23 MED ORDER — ZOLEDRONIC ACID 4 MG/100ML IV SOLN
4.0000 mg | Freq: Once | INTRAVENOUS | Status: AC
  Administered 2013-06-23: 4 mg via INTRAVENOUS
  Filled 2013-06-23: qty 100

## 2013-06-23 MED ORDER — SODIUM CHLORIDE 0.9 % IJ SOLN
10.0000 mL | Freq: Once | INTRAMUSCULAR | Status: AC
  Administered 2013-06-23: 10 mL via INTRAVENOUS
  Filled 2013-06-23: qty 10

## 2013-06-23 MED ORDER — HEPARIN SOD (PORK) LOCK FLUSH 100 UNIT/ML IV SOLN
500.0000 [IU] | Freq: Once | INTRAVENOUS | Status: AC
  Administered 2013-06-23: 500 [IU] via INTRAVENOUS
  Filled 2013-06-23: qty 5

## 2013-06-23 NOTE — Patient Instructions (Signed)

## 2013-06-23 NOTE — Telephone Encounter (Signed)
gv pt appt schedule for december.  °

## 2013-06-25 NOTE — Progress Notes (Signed)
Hematology and Oncology Follow Up Visit  [name] [id number] 03-Feb-1946 67 y.o. 04/28/2013 8:29 PM Amy Ermalene Searing, MD  CHIEF COMPLAINT:  Multiple Myeloma  Principle Diagnosis: Kappa light chain multiple myeloma, ISS III with diagnosis established in December 2012.  Prior Therapy:  1. Velcade, Cytoxan, and Decadron on a weekly basis from 07/10/2011 through 10/23/2011; 2. High-dose chemotherapy on 11/23/2011, consisting of carmustine 600 mg IV. He received cytarabine 400 mg IV on 12/24/2011, 12/25/2011, 12/26/2011, and 12/27/2011. He received etoposide 300 mg IV daily from 12/24/2011 through 12/27/2011, four doses. He received melphalan 280 mg IV on 12/28/2011.  3. He was scheduled to receive autologous stem cell reinfusion on 12/29/2011.  Current therapy: -Revlimid 15 mg daily, 3 weeks on, 1 week off. Revlimid was started on 05/11/2012.  Decadron dose was 20 mg weekly as of 08/10/2012.  Because of cushingoid effects, the Decadron dose was decreased to 12 mg weekly starting on Wednesday, June 11th. Decadron was started at 40 mg weekly on 05/11/2012. Adjunctive therapy with Zometa 4 mg IV is being given every 2 months.  Zometa was started on 09/02/2012.  Dental clearance was obtained.  He is on fentanyl 25 and oxycodone prn.   Interim History:  [name] was seen today for followup of his kappa light chain multiple myeloma diagnosed in December 2012.   He was last seen by me  on 04/28/2013.  He continues his Revlimid 3 weeks out of every 4. This was started on 12/21/2012 and will conclude on 01/10/2014.  He is taking decadron 12 mg because of cushingoid effects.   In addition, he is on zometa 4 mg every 2 months (was placed on hold due to current osteomyelitis but restarted today 06/23/2013).  He reports that he has osteomyelitis of his left 2nd toe and now has completed treatment with a course of antibiotics.  He had a technetium scan of his foot that demonstrates continued osteo.  However, he  reports much improvement in his toe.  He also reports that his PSA is slowly rising and this may require future therapy by his urologist, Dr. Laverle Patter.  He saw Dr. Barbaraann Boys of [facility] last month (05/25/2013) and will follow-up with him again in 6 months. He remains active and reports backpain that he rates 6 out of 10 relieved with his fentanyl patch (increased to 37.5 mcg q 72 hours) and oxycodones prn.  His bowel movements are regular.  He still complains of intermittent sores in his mouth. He denies any recent hospitalizations or emergency room visits.    Problem list:  1. Kappa light chain multiple myeloma, ISS III with diagnosis established in December 2012 when the patient presented with marked anemia, thrombocytopenia and extensive bone marrow replacement with immature plasma cells/plasma blasts and circulating plasma cells in the peripheral blood. Bone marrow was carried out on 07/08/2011. In addition, the patient had severe renal insufficiency and hypercalcemia. He was requiring transfusions of platelets and red cells. He appeared to have limited bone disease characterized by mild compression fractures of T12 and L3. A bone density scan done a couple months preceding the diagnosis had been normal. Results of cytogenetic analysis revealed the presence of 2 clonal cell lines. The 1st cell line was chromosomally normal (20% of cells). 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 There was a gain of chromosome 7 and a marker chromosome. Chromosome 2 appeared to be in a  rearrangement involving both its short and long arms. FISH studies showed loss of chromosome 4, loss of chromosome 14, loss of chromosome 13, and loss of chromosome 17, i.e. 4 P-, 14 Q-, 13 Q- and 17 P-. These findings would be consistent with abnormalities associated with multiple myeloma. The patient received Velcade, Cytoxan, and Decadron on a weekly basis  from 07/10/2011 through 10/23/2011. The patient developed pain in the region of his right hip during the month of April. An MRI of the right hip at [facility] on 11/19/2011 showed a destructive lesion of the right pubic body and adjacent rami with associated soft tissue component. There was marked heterogeneous abnormal marrow signal throughout the visualized marrow. The patient also underwent an MRI of the lumbar spine with and without IV contrast on 11/19/2011. Multiple compression deformities were noted, most severely at L3, where there was slight retropulsion resulting in moderate canal stenosis. An MRI of the right knee on 12/01/2011 showed no evidence of multiple myeloma; however, there was a horizontal medial meniscus tear involving the body and posterior horn and degenerative changes. These studies were carried out at [facility]. The patient underwent radiation treatments to the right hemipelvis. He received apparently 10 treatments at [facility] with some improvement in pain. Mr. Benninger then underwent high-dose chemotherapy on 11/23/2011, consisting of carmustine 600 mg IV. He received cytarabine 400 mg IV on 12/24/2011, 12/25/2011, 12/26/2011, and 12/27/2011. He received etoposide 300 mg IV daily from 12/24/2011 through 12/27/2011, four doses. He received melphalan 280 mg IV on 12/28/2011. He was scheduled to receive autologous stem cell reinfusion on 12/29/2011. The patient's initial hospitalization was from 12/23/2011 through 12/28/2011. He required readmission at [facility] from 01/06/2012 through 01/10/2012. He had developed fever, shaking chills, diarrhea, weakness, and hypotension. One out of 2 blood cultures grew out gram-positive cocci, felt to be staphylococcus from his Hickman catheter. He was treated with ceftazidime and vancomycin. Vancomycin was discontinued after a 10-day course on 01/15/2012. The patient was found to have progressive disease in late September 2013 as evidenced by an increase in his serum kappa  light chains and serum protein M spike. It was recommended that the patient start Revlimid 15 mg daily, 3 weeks on, 1 week off, and Decadron 40 mg weekly, in conjunction with aspirin 81 mg daily. Those treatments started on 05/11/2012.  2. Pancytopenia at diagnosis secondary to extensive marrow replacement by multiple myeloma.  3. Renal insufficiency, markedly improved since diagnosis.  4. Hypercalcemia at presentation, now resolved following treatment.  5. Anemia which developed in mid January 2012 secondary to treatment responsive to Aranesp.  6. History of prostate cancer dating back to September 2003, treated with robotic-assisted laparoscopic radical prostatectomy by Dr. [name].  The tumor was pathologic T3a N0 Mx.  Gleason score was 4+3=7 Tumor involved both lobes of the prostate with capsular extension and a positive margin focally at the left apex.  Pretreatment PSA was 4.87.  The patient did not receive postoperative radiation.  Recently, his PSA has been slowly rising.  PSA on 10/28/2012 was 0.32.  7. Hypertension.  8. Dyslipidemia.  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  10. Right-sided Port-A-Cath placement on 08/05/2011. This was subsequently removed when the patient was at [facility] in June 2013.  11. Kyphoplasty carried out by Dr. [name] on 10/13/2011 involving vertebral bodies T12, L2 and L3. Review of bone biopsy pathology from kyphoplasties done on 10/13/2011 showed scattered plasma cells with no kappa or lambda light chain restriction.  12. Destructive lesion of the right pubic body and adjacent rami with associated soft tissue component detected on MRI of the right hip on 11/19/2011 at [facility]. The patient received 10 radiation treatments at [facility] with improvement in pain.  13. Weight loss of approximately 35 pounds from May through June 2013. The patient had regained all his weight by December 2013.  14. Left-sided  Port-A-Cath was placed by Interventional Radiology on 07/29/2012.   Medications: I have reviewed the patient's current medications.  Current Outpatient Prescriptions  Medication Sig Dispense Refill  . acyclovir (ZOVIRAX) 400 MG tablet Take 1 tablet (400 mg total) by mouth 2 (two) times daily.  180 tablet  1  . allopurinol (ZYLOPRIM) 100 MG tablet Take 1 tablet (100 mg total) by mouth daily.  90 tablet  3  . aspirin 81 MG tablet Take 81 mg by mouth daily.      Marland Kitchen lenalidomide (REVLIMID) 15 MG capsule Take 1 capsule (15 mg total) by mouth daily. Take for 21 days then rest for 7 days  21 capsule  0  . lidocaine-prilocaine (EMLA) cream Apply topically as needed. Apply to port site one hour before treatment and cover with plastic wrap  30 g  2  . NONFORMULARY OR COMPOUNDED ITEM Baclofn/amitrp/ketamn cream topically to feet TID prn for neuropathy      . pantoprazole (PROTONIX) 40 MG tablet Take 1 tablet (40 mg total) by mouth daily.  90 tablet  3  . prochlorperazine (COMPAZINE) 10 MG tablet Take 10 mg by mouth every 6 (six) hours as needed.      . fentaNYL (DURAGESIC - DOSED MCG/HR) 50 MCG/HR Place 1 patch (50 mcg total) onto the skin every 3 (three) days.  10 patch  0   No current facility-administered medications for this visit.   Allergies:  Allergies  Allergen Reactions  . Celebrex [Celecoxib] Nausea Only  . Meloxicam Itching  . Percocet [Oxycodone-Acetaminophen] Itching  . Vicodin [Hydrocodone-Acetaminophen] Itching    Past Medical History, Surgical history, Social history, and Family History were reviewed and updated.  IMMUNIZATIONS:  Pneumovax was given on 04/28/2011 at the [facility]. He had a flu shot on 04/13/2012.   SMOKING HISTORY: The patient had smoked 2 packs of cigarettes a day for approximately 25 years but stopped smoking in 1998.  Review of Systems: Constitutional:  Negative for fever, chills, night sweats, anorexia, weight loss, pain. Cardiovascular: no chest  pain or dyspnea on exertion Respiratory: no cough, shortness of breath, or wheezing Neurological: no TIA or stroke symptoms Dermatological: positive for erythrema around distal 2nd Left toe ENT: negative for - epistaxis Skin: Negative. Gastrointestinal: no abdominal pain, change in bowel habits, or black or bloody stools Genito-Urinary: no dysuria, trouble voiding, or hematuria Hematological and Lymphatic: negative for bleeding problems Musculoskeletal: positive for - joint pain, joint stiffness and pain in back - lower Remaining ROS negative.  Physical Exam: BP 143/71  Pulse 67  Temp(Src) 97.8 F (36.6 C) (Oral)  Resp 18  Ht 6\' 3"  (1.905 m)  Wt 219 lb 14.4 oz (99.746 kg)  BMI 27.49 kg/m2 ECOG: 1 General appearance: alert, cooperative, appears stated age and no distress HEENT: PERRLa; EOMi;  Head: Normocephalic, without obvious abnormality, atraumatic Neck: no adenopathy, supple, symmetrical, trachea midline and thyroid not enlarged, symmetric, no tenderness/mass/nodules Lymph nodes: Cervical, supraclavicular, and axillary nodes normal. Heart:regular rate and rhythm, S1, S2 normal, no murmur, click, rub or gallop Lung:chest clear, no wheezing, rales, normal symmetric air entry Abdomin: soft,  non-tender, without masses or organomegaly EXT:No peripheral edema; L foot second digit mildly red but without TTP.  Neuro: Non focal. Gait, within normal limits.     Lab Results: Lab Results  Component Value Date   WBC 5.3 06/23/2013   HGB 11.3* 06/23/2013   HCT 32.7* 06/23/2013   MCV 108.6* 06/23/2013   PLT 69* 06/23/2013     Chemistry      Component Value Date/Time   NA 141 06/23/2013 1627   NA 141 04/28/2013 1003   K 3.5 06/23/2013 1627   K 4.0 04/28/2013 1003   CL 104 06/23/2013 1627   CL 110* 12/29/2012 1151   CO2 22 06/23/2013 1627   CO2 23 04/28/2013 1003   BUN 26* 06/23/2013 1627   BUN 30.7* 04/28/2013 1003   CREATININE 1.22 06/23/2013 1627   CREATININE 1.5* 04/28/2013  1003      Component Value Date/Time   CALCIUM 9.0 06/23/2013 1627   CALCIUM 9.1 04/28/2013 1003   ALKPHOS 57 06/23/2013 1627   ALKPHOS 52 04/28/2013 1003   AST 18 06/23/2013 1627   AST 17 04/28/2013 1003   ALT 28 06/23/2013 1627   ALT 27 04/28/2013 1003   BILITOT 0.4 06/23/2013 1627   BILITOT 0.63 04/28/2013 1003     IMAGING STUDIES:  1. Two-view chest x-ray on 06/28/2011 shows some mild bronchitic changes.  2. Metastatic bone survey on 07/04/2011 showed no lytic lesions in the visualized axial or appendicular skeleton. There was mild superior end-plate compression deformities at T12 and L3.  3. CT-guided left iliac bone marrow aspirate and core biopsy was carried out on 07/08/2011.  4. MRI of the lumbar spine without IV contrast on 09/30/2011 showed progressive compression fractures at L2, L3 and T12. There is diffuse scattered osseous metastatic disease. There was lumbar spondylosis and degenerative disk disease causing various degrees of impingement at all levels between L2 and S1. Progressive collapse of L3 was noted with 75% loss of vertebral body height, whereas on the prior study of 07/04/2011 there was 20% loss of height. There was 6 mm of bony retropulsion. There was progressive loss of L2 vertebral body noted with 50% loss of height whereas formerly it was at 20% loss. There is a 50-60% loss of vertebral body height noted at T12 minimally increased from the prior study with 2 mm of bony retropulsion. There was a 2.3 cm hemangioma present at the L1 vertebral body.  5. Chest x-ray, 2 view, from 01/06/2012 carried out at [facility] showed increased interstitial opacities, possibly representing edema or  infection.  6. MRI of the right hip carried out at [facility] on 04/07/2012 and compared with the prior MRI of the right hip on 11/19/2011 showed interval development of a linear area of abnormal signal seen through the site of previous soft tissue mass within the right superior pubic rami. Findings were  felt to represent a pathologic fracture most likely due to the underlying tumor involvement and/or an element of underlying radiation change. There was also re-demonstration of myelomatous metastatic lesions seen within the right hemipelvis and right proximal femur. Findings appear to be similar to the previous study. There was interval improvement of the previously visualized soft tissue mass involving the superior and inferior pubic rami on the right. There continued to be mild cortical expansion and destruction within this region.  7. Port-A-Cath placement with ultrasound and fluoroscopic guidance was carried out on 07/29/2012 by Interventional Radiology. The report indicates that the catheter was placed on the right  side through the right internal jugular vein. However, I believe, the Port-A-Cath is actually situated on the left side. 8. Chest x-ray, 2-view, from 10/11/2012 showed no active or acute cardiopulmonary disease.  There was a compression fracture of either T5 or T6 with a 40% anterior wedge compression, which may have progressed slightly since the previous study of 07/07/2011. 9. Bone Survey (01/24/2013). Osseous demineralization. Prior spinal augmentation procedures at T12, L2, and L3. Old fracture posterior right eighth rib. Degenerative disc facet disease changes cervical spine. Chronic anterior height loss T5. New mixed lytic and sclerotic process involving the right pubic body into the right superior pubic ramus, question healing fracture versus metastatic lesion; correlation with patient history and any recent prior outside imaging the patient may have. May also consider MR imaging of the pelvis for further evaluation. 10. MRI of the Pelvis (02/24/2013). Myeloma lesion of the right pubic body with adjacent pathologic fractures. Numerous small myeloma lesions throughout the pelvic bones, proximal femurs, and distal lumbar spine.  Impression and Plan: 1. Kappa light chain multiple myeloma,  ISS III.  -- Treatment. Continue Revlimid 15 mg daily, 3 weeks on, 1 week off, and Decadron 12 mg weekly inconjunction with aspirin 81 mg daily.  Decadron dose modified secondary to cushiongoid side effect.  His kappa chains appear very low and clinically he is doing well.  We will continue current dose and check CBCs weekly and hold his revlimid if his plt count is less than 30K; then, we will resume it at 10 mg daily, 3 weeks on, 1 week off.     -- Adjuntive therapy.  He is on zometa q 2 months.  Those treatments started on 05/11/2012 and his last dose was 06/23/2013. We will resume his zometa based on clinical resolution of his osteo.  Discussed with pharmacy and no contraindications.  We will give 2nd zometa dose in 1 month and then resume q 2 months.   --Complications including pathological fracture of right pelvis s/p XRT and secondary backpain, renal insufficiency, hypercalcemia (now resolved).  His pain level remains above 5 despite current pain regiment.  We will increase his fentanyl patch to 50 mcg/hr/3 days. A prescription was provided today.   2. Osteomyelitis. Currently,off antibiotics.  Nuclear scan consistent with probable osteomyelitis.   Likely secondary to ongoing Multiple myeloma. We will monitor clinically.   3. History of prostate cancer dating back to September 2003, treated with robotic-assisted laparoscopic radical prostatectomy by Dr. [name].  The tumor was pathologic T3a N0 Mx.  Gleason score was 4+3=7 Tumor involved both lobes of the prostate with capsular extension and a positive margin focally at the left apex.  Pretreatment PSA was 4.87.  The patient did not receive postoperative radiation.  Recently, his PSA has been slowly rising.  He will follow up with urology for treatment options.   4. RTC in 1 month.  We will obtain CBC, CMP, quantitative immunoglobulins, serum light chain and immunofixation electrophoresis.     Spent more than half the time coordinating care.     Tierre Gerard, MD 06/25/2013 8:30 PM

## 2013-06-27 ENCOUNTER — Telehealth: Payer: Self-pay | Admitting: *Deleted

## 2013-06-27 ENCOUNTER — Other Ambulatory Visit: Payer: Self-pay | Admitting: *Deleted

## 2013-06-27 DIAGNOSIS — C9000 Multiple myeloma not having achieved remission: Secondary | ICD-10-CM

## 2013-06-27 LAB — IMMUNOFIXATION ELECTROPHORESIS
IgG (Immunoglobin G), Serum: 497 mg/dL — ABNORMAL LOW (ref 650–1600)
IgM, Serum: 11 mg/dL — ABNORMAL LOW (ref 41–251)
Total Protein, Serum Electrophoresis: 5.1 g/dL — ABNORMAL LOW (ref 6.0–8.3)

## 2013-06-27 MED ORDER — LENALIDOMIDE 15 MG PO CAPS: 15.0000 mg | ORAL_CAPSULE | Freq: Every day | ORAL | Status: DC

## 2013-06-27 NOTE — Telephone Encounter (Signed)
Received confirmation of fax receipt from [facility] for Revlimid.

## 2013-06-27 NOTE — Telephone Encounter (Signed)
Spoke with pt on cell phone and instructed pt to call [employer] to take survey for Revlimid refill.  Pt voiced understanding.  

## 2013-06-30 ENCOUNTER — Telehealth: Payer: Self-pay | Admitting: *Deleted

## 2013-06-30 NOTE — Telephone Encounter (Signed)
Called [facility] pharmacy and spoke with [employer].  Informed Herbert Seta that pt receives Revlimid through Industrial[facility]product [facility].  Heather voiced understanding and stated she would make a note in pt's chart. [facility]    [phone]  [address].

## 2013-07-04 NOTE — Telephone Encounter (Signed)
RECEIVED A FAX FROM [facility] CONCERNING A CONFIRMATION OF PRESCRIPTION SHIPMENT FOR REVLIMID ON 07/03/13.

## 2013-07-06 ENCOUNTER — Other Ambulatory Visit: Payer: Self-pay | Admitting: *Deleted

## 2013-07-06 ENCOUNTER — Telehealth: Payer: Self-pay | Admitting: *Deleted

## 2013-07-06 ENCOUNTER — Other Ambulatory Visit: Payer: Self-pay | Admitting: Internal Medicine

## 2013-07-06 MED ORDER — DEXAMETHASONE 4 MG PO TABS: 12.0000 mg | ORAL_TABLET | ORAL | Status: DC

## 2013-07-06 NOTE — Telephone Encounter (Signed)
Received call from pt requesting refill of Dexamethasone.  Spoke with pt and was informed that pt takes Decadron 12 mg weekly;  Pt started new cycle of Revlimid 15 mg daily 07/05/13.  Pt needs refill of Decadron.  Pt uses  [facility], community in [place]. [employer]    [phone].

## 2013-07-17 ENCOUNTER — Other Ambulatory Visit: Payer: Self-pay | Admitting: *Deleted

## 2013-07-17 DIAGNOSIS — C9000 Multiple myeloma not having achieved remission: Secondary | ICD-10-CM

## 2013-07-17 MED ORDER — LENALIDOMIDE 15 MG PO CAPS: 15.0000 mg | ORAL_CAPSULE | Freq: Every day | ORAL | Status: DC

## 2013-07-24 ENCOUNTER — Telehealth: Payer: Self-pay | Admitting: *Deleted

## 2013-07-24 NOTE — Telephone Encounter (Signed)
Received fax from [facility]- Revlimid shipped 07/21/13 

## 2013-07-25 ENCOUNTER — Ambulatory Visit: Payer: Medicare Other

## 2013-07-25 ENCOUNTER — Telehealth: Payer: Self-pay | Admitting: *Deleted

## 2013-07-25 ENCOUNTER — Encounter: Payer: Self-pay | Admitting: Internal Medicine

## 2013-07-25 ENCOUNTER — Other Ambulatory Visit (HOSPITAL_BASED_OUTPATIENT_CLINIC_OR_DEPARTMENT_OTHER): Payer: Medicare Other

## 2013-07-25 ENCOUNTER — Ambulatory Visit (HOSPITAL_BASED_OUTPATIENT_CLINIC_OR_DEPARTMENT_OTHER): Payer: Medicare Other | Admitting: Internal Medicine

## 2013-07-25 VITALS — BP 133/77 | HR 82 | Temp 97.1°F | Resp 18 | Ht 75.0 in | Wt 209.5 lb

## 2013-07-25 DIAGNOSIS — M869 Osteomyelitis, unspecified: Secondary | ICD-10-CM

## 2013-07-25 DIAGNOSIS — Z8546 Personal history of malignant neoplasm of prostate: Secondary | ICD-10-CM

## 2013-07-25 DIAGNOSIS — S32000A Wedge compression fracture of unspecified lumbar vertebra, initial encounter for closed fracture: Secondary | ICD-10-CM

## 2013-07-25 DIAGNOSIS — C9000 Multiple myeloma not having achieved remission: Secondary | ICD-10-CM

## 2013-07-25 LAB — CBC WITH DIFFERENTIAL/PLATELET
EOS%: 27.1 % — ABNORMAL HIGH (ref 0.0–7.0)
Eosinophils Absolute: 1.1 10*3/uL — ABNORMAL HIGH (ref 0.0–0.5)
HGB: 13.9 g/dL (ref 13.0–17.1)
MONO#: 0.9 10*3/uL (ref 0.1–0.9)
NEUT#: 1.3 10*3/uL — ABNORMAL LOW (ref 1.5–6.5)
NEUT%: 31 % — ABNORMAL LOW (ref 39.0–75.0)
Platelets: 77 10*3/uL — ABNORMAL LOW (ref 140–400)
RBC: 3.59 10*6/uL — ABNORMAL LOW (ref 4.20–5.82)
RDW: 14.5 % (ref 11.0–14.6)
WBC: 4.1 10*3/uL (ref 4.0–10.3)
lymph#: 0.7 10*3/uL — ABNORMAL LOW (ref 0.9–3.3)

## 2013-07-25 LAB — COMPREHENSIVE METABOLIC PANEL (CC13)
AST: 21 U/L (ref 5–34)
Albumin: 3.3 g/dL — ABNORMAL LOW (ref 3.5–5.0)
Alkaline Phosphatase: 54 U/L (ref 40–150)
Anion Gap: 13 mEq/L — ABNORMAL HIGH (ref 3–11)
CO2: 21 mEq/L — ABNORMAL LOW (ref 22–29)
Calcium: 9 mg/dL (ref 8.4–10.4)
Chloride: 105 mEq/L (ref 98–109)
Creatinine: 1.1 mg/dL (ref 0.7–1.3)
Glucose: 94 mg/dl (ref 70–140)
Potassium: 4.3 mEq/L (ref 3.5–5.1)
Sodium: 138 mEq/L (ref 136–145)
Total Protein: 6.4 g/dL (ref 6.4–8.3)

## 2013-07-25 MED ORDER — SODIUM CHLORIDE 0.9 % IJ SOLN
10.0000 mL | INTRAMUSCULAR | Status: DC | PRN
  Administered 2013-07-25: 10 mL via INTRAVENOUS
  Filled 2013-07-25: qty 10

## 2013-07-25 MED ORDER — ZOLEDRONIC ACID 4 MG/5ML IV CONC
4.0000 mg | Freq: Once | INTRAVENOUS | Status: AC
  Administered 2013-07-25: 4 mg via INTRAVENOUS
  Filled 2013-07-25: qty 5

## 2013-07-25 MED ORDER — HEPARIN SOD (PORK) LOCK FLUSH 100 UNIT/ML IV SOLN
500.0000 [IU] | Freq: Once | INTRAVENOUS | Status: AC
  Administered 2013-07-25: 500 [IU] via INTRAVENOUS
  Filled 2013-07-25: qty 5

## 2013-07-25 MED ORDER — SODIUM CHLORIDE 0.9 % IV SOLN
Freq: Once | INTRAVENOUS | Status: AC
  Administered 2013-07-25: 13:00:00 via INTRAVENOUS

## 2013-07-25 NOTE — Progress Notes (Signed)
Hematology and Oncology Follow Up Visit  Jeremiah LOSEE [id number] 12-01-1945 67 y.o. [name], MD  CHIEF COMPLAINT:  Multiple Myeloma  Principle Diagnosis: Kappa light chain multiple myeloma, ISS III with diagnosis established in December 2012.  Prior Therapy:  1. Velcade, Cytoxan, and Decadron on a weekly basis from 07/10/2011 through 10/23/2011; 2. High-dose chemotherapy on 11/23/2011, consisting of carmustine 600 mg IV. He received cytarabine 400 mg IV on 12/24/2011, 12/25/2011, 12/26/2011, and 12/27/2011. He received etoposide 300 mg IV daily from 12/24/2011 through 12/27/2011, four doses. He received melphalan 280 mg IV on 12/28/2011.  3. 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  Current therapy: -Revlimid 15 mg daily, 3 weeks on, 1 week off. Revlimid was started on 05/11/2012.  Decadron dose was 20 mg weekly as of 08/10/2012.  Because of cushingoid effects, the Decadron dose was decreased to 12 mg weekly starting on Wednesday, June 11th. Decadron was started at 40 mg weekly on 05/11/2012. Adjunctive therapy with Zometa 4 mg IV is being given every 2 months.  Zometa was started on 09/02/2012.  Dental clearance was obtained.  He is on fentanyl 25 and oxycodone prn.   Interim History:  Mr. Rabalais was seen today for followup of his kappa light chain multiple myeloma diagnosed in December 2012.   He was last seen by me  on 06/23/2013.  He continues his Revlimid 3 weeks out of every 4. This was started on 12/21/2012 and will conclude on 01/10/2014.  He is taking decadron 12 mg because of cushingoid effects.   In addition, he is on zometa 4 mg every month (was placed on hold due to current osteomyelitis but restarted on11/28/2014).  He reports that he has osteomyelitis of his left 2nd toe and now has completed treatment with a course of antibiotics.  He had a technetium scan of his foot that demonstrates continued osteo.  However, he reports much improvement in his toe.  He  also reports that his PSA is slowly rising and this may require future therapy by his urologist, Dr. Laverle Patter. He saw Dr. Barbaraann Boys of [facility] on (05/25/2013) and will follow-up with her again in 6 months. He remains active and reports backpain that he rates 2 out of 10 relieved with his fentanyl patch (increased to 50 mcg q 72 hours) and oxycodones prn.  His bowel movements are regular.  He still complains of intermittent sores in his mouth. He denies any recent hospitalizations or emergency room visits.  He reports stopping cymbalta because it caused him to lose weight.  Problem list:  1. Kappa light chain multiple myeloma, ISS III with diagnosis established in December 2012 when the patient presented with marked anemia, thrombocytopenia and extensive bone marrow replacement with immature plasma cells/plasma blasts and circulating plasma cells in the peripheral blood. Bone marrow was carried out on 07/08/2011. In addition, the patient had severe renal insufficiency and hypercalcemia. He was requiring transfusions of platelets and red cells. He appeared to have limited bone disease characterized by mild compression fractures of T12 and L3. A bone density scan done a couple months preceding the diagnosis had been normal. Results of cytogenetic analysis revealed the presence of 2 clonal cell lines. The 1st cell line was chromosomally normal (20% of cells). 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 There was a gain of chromosome 7 and a marker chromosome. Chromosome 2 appeared to be in a  rearrangement involving both its short and long arms. FISH studies showed loss of chromosome 4, loss of chromosome 14, loss of chromosome 13, and loss of chromosome 17, i.e. 4 P-, 14 Q-, 13 Q- and 17 P-. These findings would be consistent with abnormalities associated with multiple myeloma. The patient received Velcade, Cytoxan, and Decadron on a  weekly basis from 07/10/2011 through 10/23/2011. The patient developed pain in the region of his right hip during the month of April. An MRI of the right hip at [facility] on 11/19/2011 showed a destructive lesion of the right pubic body and adjacent rami with associated soft tissue component. There was marked heterogeneous abnormal marrow signal throughout the visualized marrow. The patient also underwent an MRI of the lumbar spine with and without IV contrast on 11/19/2011. Multiple compression deformities were noted, most severely at L3, where there was slight retropulsion resulting in moderate canal stenosis. An MRI of the right knee on 12/01/2011 showed no evidence of multiple myeloma; however, there was a horizontal medial meniscus tear involving the body and posterior horn and degenerative changes. These studies were carried out at [facility]. The patient underwent radiation treatments to the right hemipelvis. He received apparently 10 treatments at [facility] with some improvement in pain. Mr. Meinders then underwent high-dose chemotherapy on 11/23/2011, consisting of carmustine 600 mg IV. He received cytarabine 400 mg IV on 12/24/2011, 12/25/2011, 12/26/2011, and 12/27/2011. He received etoposide 300 mg IV daily from 12/24/2011 through 12/27/2011, four doses. He received melphalan 280 mg IV on 12/28/2011. He was scheduled to receive autologous stem cell reinfusion on 12/29/2011. The patient's initial hospitalization was from 12/23/2011 through 12/28/2011. He required readmission at [facility] from 01/06/2012 through 01/10/2012. He had developed fever, shaking chills, diarrhea, weakness, and hypotension. One out of 2 blood cultures grew out gram-positive cocci, felt to be staphylococcus from his Hickman catheter. He was treated with ceftazidime and vancomycin. Vancomycin was discontinued after a 10-day course on 01/15/2012. The patient was found to have progressive disease in late September 2013 as evidenced by an increase in his  serum kappa light chains and serum protein M spike. It was recommended that the patient start Revlimid 15 mg daily, 3 weeks on, 1 week off, and Decadron 40 mg weekly, in conjunction with aspirin 81 mg daily. Those treatments started on 05/11/2012.  2. Pancytopenia at diagnosis secondary to extensive marrow replacement by multiple myeloma.  3. Renal insufficiency, markedly improved since diagnosis.  4. Hypercalcemia at presentation, now resolved following treatment.  5. Anemia which developed in mid January 2012 secondary to treatment responsive to Aranesp.  6. History of prostate cancer dating back to September 2003, treated with robotic-assisted laparoscopic radical prostatectomy by Dr. [name].  The tumor was pathologic T3a N0 Mx.  Gleason score was 4+3=7 Tumor involved both lobes of the prostate with capsular extension and a positive margin focally at the left apex.  Pretreatment PSA was 4.87.  The patient did not receive postoperative radiation.  Recently, his PSA has been slowly rising.  PSA on 10/28/2012 was 0.32.  7. Hypertension.  8. Dyslipidemia.  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  10. Right-sided Port-A-Cath placement on 08/05/2011. This was subsequently removed when the patient was at [facility] in June 2013.  11. Kyphoplasty carried out by Dr. [name] on 10/13/2011 involving vertebral bodies T12, L2 and L3. Review of bone biopsy pathology from kyphoplasties done on 10/13/2011 showed scattered plasma cells with no kappa or lambda light chain restriction.  12. Destructive lesion of the right pubic body and adjacent rami with associated soft tissue component detected on MRI of the right hip on 11/19/2011 at [facility]. The patient received 10 radiation treatments at [facility] with improvement in pain.  13. Weight loss of approximately 35 pounds from May through June 2013. The patient had regained all his weight by December 2013.  14.  Left-sided Port-A-Cath was placed by Interventional Radiology on 07/29/2012.   Medications: I have reviewed the patient's current medications.  Current Outpatient Prescriptions  Medication Sig Dispense Refill  . acyclovir (ZOVIRAX) 400 MG tablet Take 1 tablet (400 mg total) by mouth 2 (two) times daily.  180 tablet  1  . allopurinol (ZYLOPRIM) 100 MG tablet Take 1 tablet (100 mg total) by mouth daily.  90 tablet  3  . aspirin 81 MG tablet Take 81 mg by mouth daily.      Marland Kitchen dexamethasone (DECADRON) 4 MG tablet Take 3 tablets (12 mg total) by mouth once a week.  42 tablet  0  . fentaNYL (DURAGESIC - DOSED MCG/HR) 50 MCG/HR Place 1 patch (50 mcg total) onto the skin every 3 (three) days.  10 patch  0  . lenalidomide (REVLIMID) 15 MG capsule Take 1 capsule (15 mg total) by mouth daily. Take for 21 days then rest for 7 days  21 capsule  0  . lidocaine-prilocaine (EMLA) cream Apply topically as needed. Apply to port site one hour before treatment and cover with plastic wrap  30 g  2  . NONFORMULARY OR COMPOUNDED ITEM Baclofn/amitrp/ketamn cream topically to feet TID prn for neuropathy      . pantoprazole (PROTONIX) 40 MG tablet Take 1 tablet (40 mg total) by mouth daily.  90 tablet  3  . prochlorperazine (COMPAZINE) 10 MG tablet Take 10 mg by mouth every 6 (six) hours as needed.       Current Facility-Administered Medications  Medication Dose Route Frequency Provider Last Rate Last Dose  . sodium chloride 0.9 % injection 10 mL  10 mL Intravenous PRN Myra Rude, MD   10 mL at 07/25/13 1335   Allergies:  Allergies  Allergen Reactions  . Celebrex [Celecoxib] Nausea Only  . Meloxicam Itching  . Percocet [Oxycodone-Acetaminophen] Itching  . Vicodin [Hydrocodone-Acetaminophen] Itching    Past Medical History, Surgical history, Social history, and Family History were reviewed and updated.  IMMUNIZATIONS:  Pneumovax was given on 04/28/2011 at the [facility]. He had a flu shot on 04/13/2012.    SMOKING HISTORY: The patient had smoked 2 packs of cigarettes a day for approximately 25 years but stopped smoking in 1998.  Review of Systems: Constitutional:  Negative for fever, chills, night sweats, anorexia, weight loss, pain. Cardiovascular: no chest pain or dyspnea on exertion Respiratory: no cough, shortness of breath, or wheezing Neurological: no TIA or stroke symptoms Dermatological: positive for erythrema around distal 2nd Left toe ENT: negative for - epistaxis Skin: Negative. Gastrointestinal: no abdominal pain, change in bowel habits, or black or bloody stools Genito-Urinary: no dysuria, trouble voiding, or hematuria Hematological and Lymphatic: negative for bleeding problems Musculoskeletal: positive for - joint pain, joint stiffness and pain in back - lower Remaining ROS negative.  Physical Exam: BP 133/77  Pulse 82  Temp(Src) 97.1 F (36.2 C) (Oral)  Resp 18  Ht 6\' 3"  (1.905 m)  Wt 209 lb 8 oz (95.029 kg)  BMI 26.19 kg/m2 ECOG: 1 General appearance: alert, cooperative, appears stated age and no distress; younger than stated  age.  HEENT: PERRLa; EOMi; Sclerae clear.  Head: Normocephalic, without obvious abnormality, atraumatic Neck: no adenopathy, supple, symmetrical, trachea midline and thyroid not enlarged, symmetric, no tenderness/mass/nodules Lymph nodes: Cervical, supraclavicular, and axillary nodes normal. Heart:regular rate and rhythm, S1, S2 normal, no murmur, click, rub or gallop Lung:chest clear, no wheezing, rales, normal symmetric air entry Abdomen: soft, non-tender, without masses or organomegaly EXT:No peripheral edema Neuro: Non focal. Gait, within normal limits.     Lab Results: Lab Results  Component Value Date   WBC 4.1 07/25/2013   HGB 13.9 07/25/2013   HCT 40.3 07/25/2013   MCV 112.1* 07/25/2013   PLT 77* 07/25/2013     Chemistry      Component Value Date/Time   NA 138 07/25/2013 1040   NA 141 06/23/2013 1627   K 4.3  07/25/2013 1040   K 3.5 06/23/2013 1627   CL 104 06/23/2013 1627   CL 110* 12/29/2012 1151   CO2 21* 07/25/2013 1040   CO2 22 06/23/2013 1627   BUN 16.3 07/25/2013 1040   BUN 26* 06/23/2013 1627   CREATININE 1.1 07/25/2013 1040   CREATININE 1.22 06/23/2013 1627      Component Value Date/Time   CALCIUM 9.0 07/25/2013 1040   CALCIUM 9.0 06/23/2013 1627   ALKPHOS 54 07/25/2013 1040   ALKPHOS 57 06/23/2013 1627   AST 21 07/25/2013 1040   AST 18 06/23/2013 1627   ALT 44 07/25/2013 1040   ALT 28 06/23/2013 1627   BILITOT 0.69 07/25/2013 1040   BILITOT 0.4 06/23/2013 1627     Results for BURNETT, SPRAY (MRN [id number]) as of 07/26/2013 08:09  Ref. Range 07/20/2011 11:50 05/10/2012 14:56 06/10/2012 14:02 07/21/2012 11:18 09/01/2012 10:08 10/28/2012 08:36 12/29/2012 11:51 02/23/2013 12:10 03/31/2013 15:22 04/28/2013 10:03  Kappa free light chain Latest Range: 0.33-1.94 mg/dL 782.95 (H) 62.13 (H) 0.86 1.51 1.01 0.97 1.15 0.86 0.31 (L) 0.25 (L)   IMAGING STUDIES:  1. Two-view chest x-ray on 06/28/2011 shows some mild bronchitic changes.  2. Metastatic bone survey on 07/04/2011 showed no lytic lesions in the visualized axial or appendicular skeleton. There was mild superior end-plate compression deformities at T12 and L3.  3. CT-guided left iliac bone marrow aspirate and core biopsy was carried out on 07/08/2011.  4. MRI of the lumbar spine without IV contrast on 09/30/2011 showed progressive compression fractures at L2, L3 and T12. There is diffuse scattered osseous metastatic disease. There was lumbar spondylosis and degenerative disk disease causing various degrees of impingement at all levels between L2 and S1. Progressive collapse of L3 was noted with 75% loss of vertebral body height, whereas on the prior study of 07/04/2011 there was 20% loss of height. There was 6 mm of bony retropulsion. There was progressive loss of L2 vertebral body noted with 50% loss of height whereas formerly it was at 20%  loss. There is a 50-60% loss of vertebral body height noted at T12 minimally increased from the prior study with 2 mm of bony retropulsion. There was a 2.3 cm hemangioma present at the L1 vertebral body.  5. Chest x-ray, 2 view, from 01/06/2012 carried out at [facility] showed increased interstitial opacities, possibly representing edema or  infection.  6. MRI of the right hip carried out at [facility] on 04/07/2012 and compared with the prior MRI of the right hip on 11/19/2011 showed interval development of a linear area of abnormal signal seen through the site of previous soft tissue mass within the right superior pubic rami. Findings  were felt to represent a pathologic fracture most likely due to the underlying tumor involvement and/or an element of underlying radiation change. There was also re-demonstration of myelomatous metastatic lesions seen within the right hemipelvis and right proximal femur. Findings appear to be similar to the previous study. There was interval improvement of the previously visualized soft tissue mass involving the superior and inferior pubic rami on the right. There continued to be mild cortical expansion and destruction within this region.  7. Port-A-Cath placement with ultrasound and fluoroscopic guidance was carried out on 07/29/2012 by Interventional Radiology. The report indicates that the catheter was placed on the right side through the right internal jugular vein. However, I believe, the Port-A-Cath is actually situated on the left side. 8. Chest x-ray, 2-view, from 10/11/2012 showed no active or acute cardiopulmonary disease.  There was a compression fracture of either T5 or T6 with a 40% anterior wedge compression, which may have progressed slightly since the previous study of 07/07/2011. 9. Bone Survey (01/24/2013). Osseous demineralization. Prior spinal augmentation procedures at T12, L2, and L3. Old fracture posterior right eighth rib. Degenerative disc facet disease changes  cervical spine. Chronic anterior height loss T5. New mixed lytic and sclerotic process involving the right pubic body into the right superior pubic ramus, question healing fracture versus metastatic lesion; correlation with patient history and any recent prior outside imaging the patient may have. May also consider MR imaging of the pelvis for further evaluation. 10. MRI of the Pelvis (02/24/2013). Myeloma lesion of the right pubic body with adjacent pathologic fractures. Numerous small myeloma lesions throughout the pelvic bones, proximal femurs, and distal lumbar spine.  Impression and Plan:  1. Kappa light chain multiple myeloma, ISS III.  -- Treatment. Continue Revlimid 15 mg daily, 3 weeks on, 1 week off, and Decadron 12 mg weekly inconjunction with aspirin 81 mg daily.  Decadron dose modified secondary to cushiongoid side effect.  His kappa chains appear very low and clinically he is doing well.  We will continue current dose and check CBCs weekly and hold his revlimid if his plt count is less than 30K; then, we will resume it at 10 mg daily, 3 weeks on, 1 week off.    -- Adjuntive therapy.  He is on zometa monthly.  Those treatments  Initially started on 05/11/2012 bimonthly and was held as noted above and resume on 06/23/2013.  --Complications including pathological fracture of right pelvis s/p XRT and secondary backpain, renal insufficiency, hypercalcemia (now resolved).  His pain is controlled on fentanyl patch to 50 mcg/hr/3 days. A prescription was provided last visit  2. Osteomyelitis. Currently,off antibiotics.  Nuclear scan consistent with probable osteomyelitis.   Likely secondary to ongoing Multiple myeloma. We will monitor clinically.   3. History of prostate cancer dating back to September 2003, treated with robotic-assisted laparoscopic radical prostatectomy by Dr. [name].  The tumor was pathologic T3a N0 Mx.  Gleason score was 4+3=7 Tumor involved both lobes of the prostate  with capsular extension and a positive margin focally at the left apex.  Pretreatment PSA was 4.87.  The patient did not receive postoperative radiation.  Recently, his PSA has been slowly rising.  He will follow up with urology for treatment options.   4. RTC in 2 months.  We will obtain CBC, CMP, quantitative immunoglobulins, serum light chain and immunofixation electrophoresis.     Spent more than half the time coordinating care.    Yuleidy Rappleye, MD 07/25/2013 3:30 PM

## 2013-07-25 NOTE — Patient Instructions (Signed)

## 2013-07-25 NOTE — Telephone Encounter (Signed)
appts made and printed. Pt is aware that tx will be added...td

## 2013-07-25 NOTE — Telephone Encounter (Signed)
Per staff message and POF I have scheduled appts.  JMW  

## 2013-07-25 NOTE — Patient Instructions (Signed)
Lenalidomide Oral Capsules What is this medicine? LENALIDOMIDE (len a LID oh mide) is used to treat certain types of cancer, including multiple myeloma and mantle cell lymphoma. It is also used to treat some myelodysplastic syndromes that cause severe anemia requiring blood transfusions. This medicine may be used for other purposes; ask your health care provider or pharmacist if you have questions. COMMON BRAND NAME(S): Revlimid What should I tell my health care provider before I take this medicine? They need to know if you have any of these conditions: -blood clots in the legs or the lungs -infection -irregular monthly periods or menstrual cycles -kidney disease -liver disease -an unusual or allergic reaction to lenalidomide, other medicines, foods, dyes, or preservatives -pregnant or trying to get pregnant -breast-feeding How should I use this medicine? Take this medicine by mouth with a glass of water. Follow the directions on the prescription label. Do not cut, crush, or chew this medicine. Take your medicine at regular intervals. Do not take it more often than directed. Do not stop taking except on your doctor's advice. A MedGuide will be given with each prescription and refill. Read this guide carefully each time. The MedGuide may change frequently. Talk to your pediatrician regarding the use of this medicine in children. Special care may be needed. Overdosage: If you think you have taken too much of this medicine contact a poison control center or emergency room at once. NOTE: This medicine is only for you. Do not share this medicine with others. What if I miss a dose? If you miss a dose, take it as soon as you can. If your next dose is to be taken in less than 12 hours, then do not take the missed dose. Take the next dose at your regular time. Do not take double or extra doses. What may interact with this medicine? -vaccines This list may not describe all possible interactions. Give  your health care provider a list of all the medicines, herbs, non-prescription drugs, or dietary supplements you use. Also tell them if you smoke, drink alcohol, or use illegal drugs. Some items may interact with your medicine. What should I watch for while using this medicine? Visit your doctor for regular check ups. Tell your doctor or healthcare professional if your symptoms do not start to get better or if they get worse. You will need to have important blood work done while you are taking this medicine. This medicine is available only through a special program. Doctors, pharmacies, and patients must meet all of the conditions of the program. Your health care provider will help you get signed up with the program if you need this medicine. Through the program you will only receive up to a 28 day supply of the medicine at one time. You will need a new prescription for each refill. This medicine can cause birth defects. Do not get pregnant while taking this drug. Females with child-bearing potential will need to have 2 negative pregnancy tests before starting this medicine. Pregnancy testing must be done every 2 to 4 weeks as directed while taking this medicine. Use 2 reliable forms of birth control together while you are taking this medicine and for 1 month after you stop taking this medicine. If you think that you might be pregnant talk to your doctor right away. Men must use a latex condom during sexual contact with a woman while taking this medicine and for 28 days after you stop taking this medicine. A latex condom is needed even  if you have had a vasectomy. Contact your doctor right away if your partner becomes pregnant. Do not donate sperm while taking this medicine and for 28 days after you stop taking this medicine. Do not give blood while taking the medicine and for 1 month after completion of treatment to avoid exposing pregnant women to the medicine through the donated blood. Talk to your doctor  about your risk of cancer. You may be more at risk for certain types of cancers if you take this medicine. You may need blood work done while you are taking this medicine. What side effects may I notice from receiving this medicine? Side effects that you should report to your doctor or health care professional as soon as possible: -allergic reactions like skin rash, itching or hives, swelling of the face, lips, or tongue -bloody or dark, tarry stools -breathing problems -chest pain -dark urine -fever, infection, runny nose, or sore throat -pain in the legs -right upper belly pain -swelling or your hands, ankles, or leg -tiredness -unusual bleeding or bruising -yellowing of the eyes or skin  Side effects that usually do not require medical attention (report to your doctor or health care professional if they continue or are bothersome): -diarrhea -dizziness -back pain This list may not describe all possible side effects. Call your doctor for medical advice about side effects. You may report side effects to FDA at 1-800-FDA-1088. Where should I keep my medicine? Keep out of the reach of children. Store at room temperature between 15 and 30 degrees C (59 and 86 degrees F). Throw away any unused medicine after the expiration date. NOTE: This sheet is a summary. It may not cover all possible information. If you have questions about this medicine, talk to your doctor, pharmacist, or health care provider.  2014, Elsevier/Gold Standard. (2012-01-05 18:01:39) Zoledronic Acid injection (Hypercalcemia, Oncology) What is this medicine? ZOLEDRONIC ACID (ZOE le dron ik AS id) lowers the amount of calcium loss from bone. It is used to treat too much calcium in your blood from cancer. It is also used to prevent complications of cancer that has spread to the bone. This medicine may be used for other purposes; ask your health care provider or pharmacist if you have questions. COMMON BRAND NAME(S):  Zometa What should I tell my health care provider before I take this medicine? They need to know if you have any of these conditions: -aspirin-sensitive asthma -cancer, especially if you are receiving medicines used to treat cancer -dental disease or wear dentures -infection -kidney disease -receiving corticosteroids like dexamethasone or prednisone -an unusual or allergic reaction to zoledronic acid, other medicines, foods, dyes, or preservatives -pregnant or trying to get pregnant -breast-feeding How should I use this medicine? This medicine is for infusion into a vein. It is given by a health care professional in a hospital or clinic setting. Talk to your pediatrician regarding the use of this medicine in children. Special care may be needed. Overdosage: If you think you have taken too much of this medicine contact a poison control center or emergency room at once. NOTE: This medicine is only for you. Do not share this medicine with others. What if I miss a dose? It is important not to miss your dose. Call your doctor or health care professional if you are unable to keep an appointment. What may interact with this medicine? -certain antibiotics given by injection -NSAIDs, medicines for pain and inflammation, like ibuprofen or naproxen -some diuretics like bumetanide, furosemide -teriparatide -thalidomide  This list may not describe all possible interactions. Give your health care provider a list of all the medicines, herbs, non-prescription drugs, or dietary supplements you use. Also tell them if you smoke, drink alcohol, or use illegal drugs. Some items may interact with your medicine. What should I watch for while using this medicine? Visit your doctor or health care professional for regular checkups. It may be some time before you see the benefit from this medicine. Do not stop taking your medicine unless your doctor tells you to. Your doctor may order blood tests or other tests to  see how you are doing. Women should inform their doctor if they wish to become pregnant or think they might be pregnant. There is a potential for serious side effects to an unborn child. Talk to your health care professional or pharmacist for more information. You should make sure that you get enough calcium and vitamin D while you are taking this medicine. Discuss the foods you eat and the vitamins you take with your health care professional. Some people who take this medicine have severe bone, joint, and/or muscle pain. This medicine may also increase your risk for jaw problems or a broken thigh bone. Tell your doctor right away if you have severe pain in your jaw, bones, joints, or muscles. Tell your doctor if you have any pain that does not go away or that gets worse. Tell your dentist and dental surgeon that you are taking this medicine. You should not have major dental surgery while on this medicine. See your dentist to have a dental exam and fix any dental problems before starting this medicine. Take good care of your teeth while on this medicine. Make sure you see your dentist for regular follow-up appointments. What side effects may I notice from receiving this medicine? Side effects that you should report to your doctor or health care professional as soon as possible: -allergic reactions like skin rash, itching or hives, swelling of the face, lips, or tongue -anxiety, confusion, or depression -breathing problems -changes in vision -eye pain -feeling faint or lightheaded, falls -jaw pain, especially after dental work -mouth sores -muscle cramps, stiffness, or weakness -trouble passing urine or change in the amount of urine Side effects that usually do not require medical attention (report to your doctor or health care professional if they continue or are bothersome): -bone, joint, or muscle pain -constipation -diarrhea -fever -hair loss -irritation at site where injected -loss of  appetite -nausea, vomiting -stomach upset -trouble sleeping -trouble swallowing -weak or tired This list may not describe all possible side effects. Call your doctor for medical advice about side effects. You may report side effects to FDA at 1-800-FDA-1088. Where should I keep my medicine? This drug is given in a hospital or clinic and will not be stored at home. NOTE: This sheet is a summary. It may not cover all possible information. If you have questions about this medicine, talk to your doctor, pharmacist, or health care provider.  2014, Elsevier/Gold Standard. (2012-12-22 13:03:13)

## 2013-07-28 ENCOUNTER — Telehealth: Payer: Self-pay | Admitting: Internal Medicine

## 2013-07-28 NOTE — Telephone Encounter (Signed)
s.w. pt incomming call adn r/s March appt to later pt should only come every 4weeks

## 2013-08-10 ENCOUNTER — Other Ambulatory Visit: Payer: Self-pay

## 2013-08-10 DIAGNOSIS — C9000 Multiple myeloma not having achieved remission: Secondary | ICD-10-CM

## 2013-08-10 DIAGNOSIS — M869 Osteomyelitis, unspecified: Secondary | ICD-10-CM

## 2013-08-10 MED ORDER — FENTANYL 50 MCG/HR TD PT72: 50.0000 ug | MEDICATED_PATCH | TRANSDERMAL | Status: DC

## 2013-08-22 ENCOUNTER — Other Ambulatory Visit: Payer: Self-pay | Admitting: *Deleted

## 2013-08-22 NOTE — Telephone Encounter (Signed)
THIS REFILL REQUEST FOR REVLIMID WAS GIVEN TO DR.CHSIM'S NURSE, ROBIN BASS,RN.

## 2013-08-23 ENCOUNTER — Other Ambulatory Visit: Payer: Self-pay

## 2013-08-23 DIAGNOSIS — C9000 Multiple myeloma not having achieved remission: Secondary | ICD-10-CM

## 2013-08-23 MED ORDER — LENALIDOMIDE 15 MG PO CAPS: 15.0000 mg | ORAL_CAPSULE | Freq: Every day | ORAL | Status: DC

## 2013-08-23 NOTE — Telephone Encounter (Signed)
lvm that revlimid survey is due at [facility]

## 2013-08-24 ENCOUNTER — Ambulatory Visit (INDEPENDENT_AMBULATORY_CARE_PROVIDER_SITE_OTHER): Payer: Medicare Other | Admitting: Internal Medicine

## 2013-08-24 ENCOUNTER — Encounter: Payer: Self-pay | Admitting: Internal Medicine

## 2013-08-24 VITALS — BP 136/78 | HR 93 | Temp 97.6°F | Wt 212.0 lb

## 2013-08-24 DIAGNOSIS — L03119 Cellulitis of unspecified part of limb: Secondary | ICD-10-CM

## 2013-08-24 DIAGNOSIS — L03115 Cellulitis of right lower limb: Secondary | ICD-10-CM

## 2013-08-24 DIAGNOSIS — L02419 Cutaneous abscess of limb, unspecified: Secondary | ICD-10-CM

## 2013-08-24 MED ORDER — CEPHALEXIN 500 MG PO CAPS: 500.0000 mg | ORAL_CAPSULE | Freq: Four times a day (QID) | ORAL | Status: DC

## 2013-08-24 NOTE — Patient Instructions (Signed)
Cellulitis Cellulitis is an infection of the skin and the tissue beneath it. The infected area is usually red and tender. Cellulitis occurs most often in the arms and lower legs.  CAUSES  Cellulitis is caused by bacteria that enter the skin through cracks or cuts in the skin. The most common types of bacteria that cause cellulitis are Staphylococcus and Streptococcus. SYMPTOMS   Redness and warmth.  Swelling.  Tenderness or pain.  Fever. DIAGNOSIS  Your caregiver can usually determine what is wrong based on a physical exam. Blood tests may also be done. TREATMENT  Treatment usually involves taking an antibiotic medicine. HOME CARE INSTRUCTIONS   Take your antibiotics as directed. Finish them even if you start to feel better.  Keep the infected arm or leg elevated to reduce swelling.  Apply a warm cloth to the affected area up to 4 times per day to relieve pain.  Only take over-the-counter or prescription medicines for pain, discomfort, or fever as directed by your caregiver.  Keep all follow-up appointments as directed by your caregiver. SEEK MEDICAL CARE IF:   You notice red streaks coming from the infected area.  Your red area gets larger or turns dark in color.  Your bone or joint underneath the infected area becomes painful after the skin has healed.  Your infection returns in the same area or another area.  You notice a swollen bump in the infected area.  You develop new symptoms. SEEK IMMEDIATE MEDICAL CARE IF:   You have a fever.  You feel very sleepy.  You develop vomiting or diarrhea.  You have a general ill feeling (malaise) with muscle aches and pains. MAKE SURE YOU:   Understand these instructions.  Will watch your condition.  Will get help right away if you are not doing well or get worse. Document Released: 04/22/2005 Document Revised: 01/12/2012 Document Reviewed: 09/28/2011 ExitCare Patient Information 2014 [employer].  

## 2013-08-24 NOTE — Progress Notes (Signed)
Pre-visit discussion using our clinic review tool. No additional management support is needed unless otherwise documented below in the visit note.  

## 2013-08-24 NOTE — Progress Notes (Signed)
Subjective:    Patient ID: [name], male    DOB: [birth year], 68 y.o.   MRN: [id number]  HPI  Pt presents to the clinic today with c/o a cut on his lower right leg. This happened 3 days ago. He bumped up against the dishwasher while unloading it and it tore his skin. It seems to have scabbed over but the area around it is getting red and swollen. He has been putting neosporin and hydrocortisone cream on it and it does not appear to be getting any better.  Review of Systems      Past Medical History  Diagnosis Date  . HTN (hypertension)   . Allergic rhinitis   . Hyperlipemia   . Hypercalcemia of malignancy   . Multiple myeloma 07/09/2011  . Myeloma kidney   . Peripheral neuropathy     toes, sees Dr Krista Blue  . Multiple myeloma   . Fractures     compression T12,L3  . Anemia 07/2010    secondary to tx rsponsive to [facility]  . Prostate cancer 2008    s/p prostatectomy  . Prostate cancer 03/2002  . Renal insufficiency   . History of chemotherapy     weekly Velcade,Cytoxan  . 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 DDD (degenerative disc disease)     Current Outpatient Prescriptions  Medication Sig Dispense Refill  . acyclovir (ZOVIRAX) 400 MG tablet Take 1 tablet (400 mg total) by mouth 2 (two) times daily.  180 tablet  1  . aspirin 81 MG tablet Take 81 mg by mouth daily.      Marland Kitchen dexamethasone (DECADRON) 4 MG tablet Take 3 tablets (12 mg total) by mouth once a week.  42 tablet  0  . fentaNYL (DURAGESIC - DOSED MCG/HR) 50 MCG/HR Place 1 patch (50 mcg total) onto the skin every 3 (three) days.  10 patch  0  . lenalidomide (REVLIMID) 15 MG capsule Take 1 capsule (15 mg total) by mouth daily. Take for 21 days then rest for 7 days  21 capsule  0  . lidocaine-prilocaine (EMLA) cream Apply topically as needed. Apply to port site one hour before treatment and cover with plastic wrap  30 g  2  . NONFORMULARY OR COMPOUNDED ITEM  Baclofn/amitrp/ketamn cream topically to feet TID prn for neuropathy      . pantoprazole (PROTONIX) 40 MG tablet Take 1 tablet (40 mg total) by mouth daily.  90 tablet  3  . prochlorperazine (COMPAZINE) 10 MG tablet Take 10 mg by mouth every 6 (six) hours as needed.      Marland Kitchen allopurinol (ZYLOPRIM) 100 MG tablet Take 1 tablet (100 mg total) by mouth daily.  90 tablet  3  . cephALEXin (KEFLEX) 500 MG capsule Take 1 capsule (500 mg total) by mouth 4 (four) times daily.  20 capsule  0   No current facility-administered medications for this visit.    Allergies  Allergen Reactions  . Celebrex [Celecoxib] Nausea Only  . Cymbalta [Duloxetine Hcl] Other (See Comments)    Weight loss  . Meloxicam Itching  . Percocet [Oxycodone-Acetaminophen] Itching  . Vicodin [Hydrocodone-Acetaminophen] Itching  . Lyrica [Pregabalin] Rash    Lesions    Family History  Problem Relation Age of Onset  . Pneumonia Mother   . Stroke Father   . Cancer Brother     lung  . Kidney cancer Sister     History   Social History  .  Marital Status: Single    Spouse Name: N/A    Number of Children: N/A  . Years of Education: N/A   Occupational History  . 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 Smoking status: Former Smoker -- 2.00 packs/day for 30 years    Quit date: 07/27/1996  . Smokeless tobacco: Never Used  . Alcohol Use: No  . Drug Use: No  . Sexual Activity: No   Other Topics Concern  . Not on file   Social History Narrative  . No narrative on file     Constitutional: Denies fever, malaise, fatigue, headache or abrupt weight changes.  Skin: Pt reports abrasion to right lower leg. Denies rashes, lesions or ulcercations.     No other specific complaints in a complete review of systems (except as listed in HPI above).  Objective:   Physical Exam   BP 136/78  Pulse 93  Temp(Src) 97.6 F (36.4 C) (Oral)  Wt 212 lb (96.163 kg)  SpO2 99% Wt Readings from Last  3 Encounters:  08/24/13 212 lb (96.163 kg)  07/25/13 209 lb 8 oz (95.029 kg)  06/23/13 219 lb 14.4 oz (99.746 kg)    General: Appears his stated age, well developed, well nourished in NAD. Skin: Warm, dry and intact. 2 cm triangular skin tear noted on anterior lower extremity surround by ecchymosis and erythema. Warm to touch, mildly tender with palpation.  Cardiovascular: Normal rate and rhythm. S1,S2 noted.  No murmur, rubs or gallops noted. No JVD or BLE edema. No carotid bruits noted. Pulmonary/Chest: Normal effort and positive vesicular breath sounds. No respiratory distress. No wheezes, rales or ronchi noted.    BMET    Component Value Date/Time   NA 138 07/25/2013 1040   NA 141 06/23/2013 1627   K 4.3 07/25/2013 1040   K 3.5 06/23/2013 1627   CL 104 06/23/2013 1627   CL 110* 12/29/2012 1151   CO2 21* 07/25/2013 1040   CO2 22 06/23/2013 1627   GLUCOSE 94 07/25/2013 1040   GLUCOSE 88 06/23/2013 1627   GLUCOSE 127* 12/29/2012 1151   BUN 16.3 07/25/2013 1040   BUN 26* 06/23/2013 1627   CREATININE 1.1 07/25/2013 1040   CREATININE 1.22 06/23/2013 1627   CALCIUM 9.0 07/25/2013 1040   CALCIUM 9.0 06/23/2013 1627   GFRNONAA 55* 10/13/2011 0817   GFRAA 64* 10/13/2011 0817    Lipid Panel     Component Value Date/Time   CHOL 170 02/15/2013 0801   TRIG 158.0* 02/15/2013 0801   HDL 41.70 02/15/2013 0801   CHOLHDL 4 02/15/2013 0801   VLDL 31.6 02/15/2013 0801   LDLCALC 97 02/15/2013 0801    CBC    Component Value Date/Time   WBC 4.1 07/25/2013 1040   WBC 7.6 07/29/2012 0742   RBC 3.59* 07/25/2013 1040   RBC 3.43* 07/29/2012 0742   RBC 2.48* 07/04/2011 0901   HGB 13.9 07/25/2013 1040   HGB 11.8* 07/29/2012 0742   HCT 40.3 07/25/2013 1040   HCT 34.4* 07/29/2012 0742   PLT 77* 07/25/2013 1040   PLT 166 07/29/2012 0742   MCV 112.1* 07/25/2013 1040   MCV 100.3* 07/29/2012 0742   MCH 38.6* 07/25/2013 1040   MCH 34.4* 07/29/2012 0742   MCHC 34.4 07/25/2013 1040   MCHC 34.3 07/29/2012 0742    RDW 14.5 07/25/2013 1040   RDW 16.8* 07/29/2012 0742   LYMPHSABS 0.7* 07/25/2013 1040   LYMPHSABS 4.5* 07/05/2011 0700  MONOABS 0.9 07/25/2013 1040   MONOABS 4.7* 07/05/2011 0700   EOSABS 1.1* 07/25/2013 1040   EOSABS 0.3 07/05/2011 0700   BASOSABS 0.0 07/25/2013 1040   BASOSABS 1.0* 07/05/2011 0700    Hgb A1C Lab Results  Component Value Date   HGBA1C 5.4 03/02/2013        Assessment & Plan:   Cellulitis of RLE:  eRx for keflex Advised pt to keep it elevated while sitting at home [facility] to continue neosporin to the area  RTC as needed or if redness, swelling, pain persist or worsen

## 2013-08-25 ENCOUNTER — Ambulatory Visit: Payer: Medicare Other

## 2013-08-25 ENCOUNTER — Other Ambulatory Visit: Payer: Self-pay | Admitting: Internal Medicine

## 2013-08-25 ENCOUNTER — Other Ambulatory Visit (HOSPITAL_BASED_OUTPATIENT_CLINIC_OR_DEPARTMENT_OTHER): Payer: Medicare Other

## 2013-08-25 ENCOUNTER — Ambulatory Visit (HOSPITAL_BASED_OUTPATIENT_CLINIC_OR_DEPARTMENT_OTHER): Payer: Medicare Other

## 2013-08-25 VITALS — BP 151/78 | HR 64 | Resp 16

## 2013-08-25 DIAGNOSIS — Z95828 Presence of other vascular implants and grafts: Secondary | ICD-10-CM

## 2013-08-25 DIAGNOSIS — N08 Glomerular disorders in diseases classified elsewhere: Principal | ICD-10-CM

## 2013-08-25 DIAGNOSIS — C9000 Multiple myeloma not having achieved remission: Secondary | ICD-10-CM

## 2013-08-25 LAB — BASIC METABOLIC PANEL (CC13)
Anion Gap: 9 mEq/L (ref 3–11)
BUN: 31.2 mg/dL — ABNORMAL HIGH (ref 7.0–26.0)
CHLORIDE: 108 meq/L (ref 98–109)
CO2: 23 meq/L (ref 22–29)
Calcium: 9.3 mg/dL (ref 8.4–10.4)
Creatinine: 1.1 mg/dL (ref 0.7–1.3)
Glucose: 86 mg/dl (ref 70–140)
Potassium: 4.2 mEq/L (ref 3.5–5.1)
SODIUM: 140 meq/L (ref 136–145)

## 2013-08-25 LAB — CBC WITH DIFFERENTIAL/PLATELET
BASO%: 0.8 % (ref 0.0–2.0)
Basophils Absolute: 0 10*3/uL (ref 0.0–0.1)
EOS%: 1.6 % (ref 0.0–7.0)
Eosinophils Absolute: 0.1 10*3/uL (ref 0.0–0.5)
HCT: 35.3 % — ABNORMAL LOW (ref 38.4–49.9)
HGB: 12 g/dL — ABNORMAL LOW (ref 13.0–17.1)
LYMPH%: 8.4 % — AB (ref 14.0–49.0)
MCH: 37.3 pg — ABNORMAL HIGH (ref 27.2–33.4)
MCHC: 33.8 g/dL (ref 32.0–36.0)
MCV: 110.2 fL — ABNORMAL HIGH (ref 79.3–98.0)
MONO#: 1.6 10*3/uL — ABNORMAL HIGH (ref 0.1–0.9)
MONO%: 26.9 % — AB (ref 0.0–14.0)
NEUT#: 3.7 10*3/uL (ref 1.5–6.5)
NEUT%: 62.3 % (ref 39.0–75.0)
PLATELETS: 88 10*3/uL — AB (ref 140–400)
RBC: 3.21 10*6/uL — ABNORMAL LOW (ref 4.20–5.82)
RDW: 14.7 % — AB (ref 11.0–14.6)
WBC: 6 10*3/uL (ref 4.0–10.3)
lymph#: 0.5 10*3/uL — ABNORMAL LOW (ref 0.9–3.3)

## 2013-08-25 MED ORDER — SODIUM CHLORIDE 0.9 % IJ SOLN
10.0000 mL | INTRAMUSCULAR | Status: DC | PRN
Start: 2013-08-25 — End: 2013-08-25
  Administered 2013-08-25 (×2): 10 mL via INTRAVENOUS
  Filled 2013-08-25: qty 10

## 2013-08-25 MED ORDER — SODIUM CHLORIDE 0.9 % IV SOLN
INTRAVENOUS | Status: DC
  Administered 2013-08-25: 13:00:00 via INTRAVENOUS

## 2013-08-25 MED ORDER — HEPARIN SOD (PORK) LOCK FLUSH 100 UNIT/ML IV SOLN
500.0000 [IU] | Freq: Once | INTRAVENOUS | Status: AC
Start: 2013-08-25 — End: 2013-08-25
  Administered 2013-08-25: 500 [IU] via INTRAVENOUS
  Filled 2013-08-25: qty 5

## 2013-08-25 MED ORDER — SODIUM CHLORIDE 0.9 % IJ SOLN
10.0000 mL | INTRAMUSCULAR | Status: DC | PRN
  Filled 2013-08-25: qty 10

## 2013-08-25 MED ORDER — ZOLEDRONIC ACID 4 MG/100ML IV SOLN
4.0000 mg | Freq: Once | INTRAVENOUS | Status: AC
  Administered 2013-08-25: 4 mg via INTRAVENOUS
  Filled 2013-08-25: qty 100

## 2013-08-25 NOTE — Patient Instructions (Signed)

## 2013-08-28 ENCOUNTER — Other Ambulatory Visit: Payer: Self-pay | Admitting: Medical Oncology

## 2013-08-28 ENCOUNTER — Other Ambulatory Visit: Payer: Self-pay | Admitting: Internal Medicine

## 2013-08-28 DIAGNOSIS — L0390 Cellulitis, unspecified: Secondary | ICD-10-CM

## 2013-08-28 MED ORDER — DOXYCYCLINE HYCLATE 100 MG PO TABS: 100.0000 mg | ORAL_TABLET | Freq: Two times a day (BID) | ORAL | Status: DC

## 2013-08-29 NOTE — Telephone Encounter (Signed)
RECEIVED A FAX FROM [facility]ERNING A CONFIRMATION OF PRESCRIPTION SHIPMENT FOR REVLIMID ON 08/28/13.

## 2013-08-30 ENCOUNTER — Other Ambulatory Visit: Payer: Self-pay

## 2013-08-30 NOTE — Telephone Encounter (Signed)
S/w pt that [facility] has a partial prescription of decadron on file. The insurance company will only let him pick up 12 tablets at a time(a one month supply). No refil called into pharmacy at this time.

## 2013-09-13 ENCOUNTER — Other Ambulatory Visit: Payer: Self-pay | Admitting: Medical Oncology

## 2013-09-13 DIAGNOSIS — C9000 Multiple myeloma not having achieved remission: Secondary | ICD-10-CM

## 2013-09-13 DIAGNOSIS — M869 Osteomyelitis, unspecified: Secondary | ICD-10-CM

## 2013-09-13 MED ORDER — ALLOPURINOL 100 MG PO TABS: 100.0000 mg | ORAL_TABLET | Freq: Every day | ORAL | Status: DC

## 2013-09-13 MED ORDER — FENTANYL 50 MCG/HR TD PT72: 50.0000 ug | MEDICATED_PATCH | TRANSDERMAL | Status: DC

## 2013-09-19 ENCOUNTER — Other Ambulatory Visit: Payer: Self-pay | Admitting: *Deleted

## 2013-09-19 NOTE — Telephone Encounter (Signed)
THIS REFILL REQUEST FOR REVLIMID WAS GIVEN TO DR.CHISM'S NURSE, ROBIN BASS,RN.

## 2013-09-20 ENCOUNTER — Telehealth: Payer: Self-pay | Admitting: *Deleted

## 2013-09-20 DIAGNOSIS — C9000 Multiple myeloma not having achieved remission: Secondary | ICD-10-CM

## 2013-09-20 MED ORDER — LENALIDOMIDE 15 MG PO CAPS: 15.0000 mg | ORAL_CAPSULE | Freq: Every day | ORAL | Status: DC

## 2013-09-20 NOTE — Telephone Encounter (Signed)
Spoke with pt on cell phone and instructed pt to call [facility] to take survey for Revlimid refill.  Pt voiced understanding.

## 2013-09-22 ENCOUNTER — Ambulatory Visit (HOSPITAL_BASED_OUTPATIENT_CLINIC_OR_DEPARTMENT_OTHER): Payer: Medicare Other

## 2013-09-22 ENCOUNTER — Ambulatory Visit: Payer: Medicare Other

## 2013-09-22 ENCOUNTER — Other Ambulatory Visit (HOSPITAL_BASED_OUTPATIENT_CLINIC_OR_DEPARTMENT_OTHER): Payer: Medicare Other

## 2013-09-22 VITALS — BP 131/74 | HR 80 | Temp 97.6°F

## 2013-09-22 DIAGNOSIS — N189 Chronic kidney disease, unspecified: Secondary | ICD-10-CM

## 2013-09-22 DIAGNOSIS — Z95828 Presence of other vascular implants and grafts: Secondary | ICD-10-CM

## 2013-09-22 DIAGNOSIS — C9000 Multiple myeloma not having achieved remission: Secondary | ICD-10-CM

## 2013-09-22 DIAGNOSIS — D631 Anemia in chronic kidney disease: Secondary | ICD-10-CM

## 2013-09-22 DIAGNOSIS — N08 Glomerular disorders in diseases classified elsewhere: Principal | ICD-10-CM

## 2013-09-22 LAB — CBC WITH DIFFERENTIAL/PLATELET
BASO%: 0.8 % (ref 0.0–2.0)
Basophils Absolute: 0 10*3/uL (ref 0.0–0.1)
EOS ABS: 0.5 10*3/uL (ref 0.0–0.5)
EOS%: 9.2 % — ABNORMAL HIGH (ref 0.0–7.0)
HCT: 34.3 % — ABNORMAL LOW (ref 38.4–49.9)
HGB: 11.4 g/dL — ABNORMAL LOW (ref 13.0–17.1)
LYMPH%: 13.9 % — ABNORMAL LOW (ref 14.0–49.0)
MCH: 36.4 pg — ABNORMAL HIGH (ref 27.2–33.4)
MCHC: 33.3 g/dL (ref 32.0–36.0)
MCV: 109.5 fL — ABNORMAL HIGH (ref 79.3–98.0)
MONO#: 1.2 10*3/uL — AB (ref 0.1–0.9)
MONO%: 24 % — ABNORMAL HIGH (ref 0.0–14.0)
NEUT%: 52.1 % (ref 39.0–75.0)
NEUTROS ABS: 2.6 10*3/uL (ref 1.5–6.5)
Platelets: 82 10*3/uL — ABNORMAL LOW (ref 140–400)
RBC: 3.13 10*6/uL — AB (ref 4.20–5.82)
RDW: 15.1 % — ABNORMAL HIGH (ref 11.0–14.6)
WBC: 4.9 10*3/uL (ref 4.0–10.3)
lymph#: 0.7 10*3/uL — ABNORMAL LOW (ref 0.9–3.3)

## 2013-09-22 LAB — COMPREHENSIVE METABOLIC PANEL (CC13)
ALK PHOS: 65 U/L (ref 40–150)
ALT: 32 U/L (ref 0–55)
AST: 18 U/L (ref 5–34)
Albumin: 3.1 g/dL — ABNORMAL LOW (ref 3.5–5.0)
Anion Gap: 11 mEq/L (ref 3–11)
BUN: 25.4 mg/dL (ref 7.0–26.0)
CALCIUM: 8.7 mg/dL (ref 8.4–10.4)
CO2: 21 mEq/L — ABNORMAL LOW (ref 22–29)
CREATININE: 1.1 mg/dL (ref 0.7–1.3)
Chloride: 110 mEq/L — ABNORMAL HIGH (ref 98–109)
Glucose: 106 mg/dl (ref 70–140)
POTASSIUM: 3.6 meq/L (ref 3.5–5.1)
Sodium: 141 mEq/L (ref 136–145)
Total Bilirubin: 0.41 mg/dL (ref 0.20–1.20)
Total Protein: 5.9 g/dL — ABNORMAL LOW (ref 6.4–8.3)

## 2013-09-22 MED ORDER — HEPARIN SOD (PORK) LOCK FLUSH 100 UNIT/ML IV SOLN
500.0000 [IU] | Freq: Once | INTRAVENOUS | Status: AC
  Administered 2013-09-22: 500 [IU] via INTRAVENOUS
  Filled 2013-09-22: qty 5

## 2013-09-22 MED ORDER — SODIUM CHLORIDE 0.9 % IJ SOLN
10.0000 mL | INTRAMUSCULAR | Status: DC | PRN
  Administered 2013-09-22: 10 mL via INTRAVENOUS
  Filled 2013-09-22: qty 10

## 2013-09-22 MED ORDER — ZOLEDRONIC ACID 4 MG/100ML IV SOLN
4.0000 mg | Freq: Once | INTRAVENOUS | Status: AC
  Administered 2013-09-22: 4 mg via INTRAVENOUS
  Filled 2013-09-22: qty 100

## 2013-09-22 NOTE — Patient Instructions (Signed)

## 2013-09-25 ENCOUNTER — Other Ambulatory Visit: Payer: Medicare Other

## 2013-09-25 ENCOUNTER — Ambulatory Visit: Payer: Medicare Other

## 2013-09-25 LAB — KAPPA/LAMBDA LIGHT CHAINS
KAPPA FREE LGHT CHN: 1.6 mg/dL (ref 0.33–1.94)
Kappa:Lambda Ratio: 0.7 (ref 0.26–1.65)
LAMBDA FREE LGHT CHN: 2.28 mg/dL (ref 0.57–2.63)

## 2013-09-30 ENCOUNTER — Emergency Department: Payer: Self-pay | Admitting: Internal Medicine

## 2013-09-30 LAB — BASIC METABOLIC PANEL
ANION GAP: 4 — AB (ref 7–16)
BUN: 25 mg/dL — ABNORMAL HIGH (ref 7–18)
CALCIUM: 8.3 mg/dL — AB (ref 8.5–10.1)
Chloride: 108 mmol/L — ABNORMAL HIGH (ref 98–107)
Co2: 26 mmol/L (ref 21–32)
Creatinine: 1.16 mg/dL (ref 0.60–1.30)
EGFR (African American): >60
EGFR (Non-African Amer.): >60
GLUCOSE: 92 mg/dL (ref 65–99)
Osmolality: 280 (ref 275–301)
POTASSIUM: 4.3 mmol/L (ref 3.5–5.1)
Sodium: 138 mmol/L (ref 136–145)

## 2013-09-30 LAB — CBC WITH DIFFERENTIAL/PLATELET
Basophil #: 0 10*3/uL (ref 0.0–0.1)
Basophil %: 0.5 %
EOS PCT: 9.4 %
Eosinophil #: 0.5 10*3/uL (ref 0.0–0.7)
HCT: 35.8 % — ABNORMAL LOW (ref 40.0–52.0)
HGB: 12.6 g/dL — ABNORMAL LOW (ref 13.0–18.0)
LYMPHS ABS: 0.4 10*3/uL — AB (ref 1.0–3.6)
Lymphocyte %: 8.2 %
MCH: 38.6 pg — ABNORMAL HIGH (ref 26.0–34.0)
MCHC: 35 g/dL (ref 32.0–36.0)
MCV: 110 fL — ABNORMAL HIGH (ref 80–100)
Monocyte #: 0.9 x10 3/mm (ref 0.2–1.0)
Monocyte %: 16.6 %
NEUTROS PCT: 65.3 %
Neutrophil #: 3.6 10*3/uL (ref 1.4–6.5)
Platelet: 91 10*3/uL — ABNORMAL LOW (ref 150–440)
RBC: 3.25 10*6/uL — ABNORMAL LOW (ref 4.40–5.90)
RDW: 15.8 % — ABNORMAL HIGH (ref 11.5–14.5)
WBC: 5.4 10*3/uL (ref 3.8–10.6)

## 2013-10-02 ENCOUNTER — Ambulatory Visit (INDEPENDENT_AMBULATORY_CARE_PROVIDER_SITE_OTHER): Payer: Medicare Other

## 2013-10-02 ENCOUNTER — Ambulatory Visit (INDEPENDENT_AMBULATORY_CARE_PROVIDER_SITE_OTHER): Payer: Medicare Other | Admitting: Podiatry

## 2013-10-02 ENCOUNTER — Encounter: Payer: Self-pay | Admitting: Podiatry

## 2013-10-02 VITALS — BP 142/82 | HR 90 | Temp 98.4°F | Resp 12

## 2013-10-02 DIAGNOSIS — M79609 Pain in unspecified limb: Secondary | ICD-10-CM

## 2013-10-02 DIAGNOSIS — M79676 Pain in unspecified toe(s): Secondary | ICD-10-CM

## 2013-10-02 DIAGNOSIS — L03119 Cellulitis of unspecified part of limb: Secondary | ICD-10-CM

## 2013-10-02 DIAGNOSIS — L02619 Cutaneous abscess of unspecified foot: Secondary | ICD-10-CM

## 2013-10-02 NOTE — Patient Instructions (Signed)
ANTIBACTERIAL SOAP INSTRUCTIONS  THE DAY AFTER PROCEDURE   Apply Neosporin to the second right toe daily and cover with gauze and attach with Coflex until healed. Wear the surgical shoe on the right foot until the toe is healed. Complete the oral antibiotics prescribed. Cleanse the wound on the right foot following instructions below.  Please follow the instructions your doctor has marked.   Shower as usual. Before getting out, place a drop of antibacterial liquid soap (Dial) on a wet, clean washcloth.  Gently wipe washcloth over affected area.  Afterward, rinse the area with warm water.  Blot the area dry with a soft cloth and cover with antibiotic ointment (neosporin, polysporin, bacitracin) and band aid or gauze and tape  Place 3-4 drops of antibacterial liquid soap in a quart of warm tap water.  Submerge foot into water for 20 minutes.  If bandage was applied after your procedure, leave on to allow for easy lift off, then remove and continue with soak for the remaining time.  Next, blot area dry with a soft cloth and cover with a bandage.  Apply other medications as directed by your doctor, such as cortisporin otic solution (eardrops) or neosporin antibiotic ointment

## 2013-10-02 NOTE — Progress Notes (Signed)
   Subjective:    Patient ID: [name], male    DOB: 09/25/1945, 68 y.o.   MRN: [id number]  HPI PT STATED RT FOOT 2ND TOE IS BLEEDING, THROBBING, AND HAVE REDNESS 3 DAYS AGO. THE TOE DOES NOT HAVE A LOT FEELING SO IS NOT HURTING, BUT STILL BLEEDING.   DR. Tamala Julian TREATED  ME WITH CLINDAMYCIN EVERY 6 HOURS.  Patient states that he presented to emergency room in [place] area in the past 3 days and was given IV antibiotics and oral antibiotics for second right toe infection. He said that the redness and swelling has significantly improved since antibiotic therapy. Today he noticed the sudden bleeding in the second right toe.    Review of Systems     Objective:   Physical Exam  Orientated x91 68 year old white male  Vascular: DP and PT pulses are 2/4 bilaterally.  Neurological: Sensation to 10 g monofilament wire intact one/5 right and 0/5 left. Vibratory sensation nonresponsive bilaterally  Dermatological: The second right toe demonstrates low-grade erythema and edema that extends to the base of the second right toe. There is a small area of superficial drainage was serous like fluid on the dorsal distal aspect of the second right toe. Evidence of dried blood from bleeding from that area it is noted.    X-ray report right foot   Intact bony structure without fracture and/or dislocation noted. There are no cortical disruptions noted in the second right toe, or emphysema noted in that area.  Radiographic compression: No acute bony abnormality noted right foot.            Assessment & Plan:   Assessment: Superficial ulcer/abrasion second right toe which resulted in bleeding No evidence of osteomyelitis noted on x-ray at this time Improving cellulitis second right toe that is responding to the current antibiotic  Plan: Patient will continue on his oral antibiotic medication which is believed to be clindamycin as prescribed in the ER. He has an additional approximately 8 days  of this medication.  Antibacterial soft soaks and topical antibiotic ointment applied to the second right toe on a daily basis until healed.  A surgical shoe was dispensed.  Reappoint patient if patient has any future concerns.

## 2013-10-03 ENCOUNTER — Encounter: Payer: Self-pay | Admitting: Podiatry

## 2013-10-03 ENCOUNTER — Ambulatory Visit: Payer: Medicare Other | Admitting: Podiatry

## 2013-10-05 LAB — CULTURE, BLOOD (SINGLE)

## 2013-10-06 ENCOUNTER — Telehealth: Payer: Self-pay

## 2013-10-06 NOTE — Telephone Encounter (Signed)
Returning pt call from 853 am. Pt is possibly having a tooth extracted. He is questioning about how zometa will affect this. S/w Dr Juliann Mule and called pt back. LVM that Yes zometa will have to be taken into consideration. Go ahead with dental appt and see what needs to be done. His dentist can talk with Dr Juliann Mule if needed.

## 2013-10-08 ENCOUNTER — Other Ambulatory Visit: Payer: Self-pay | Admitting: Internal Medicine

## 2013-10-16 ENCOUNTER — Ambulatory Visit (INDEPENDENT_AMBULATORY_CARE_PROVIDER_SITE_OTHER): Payer: Medicare Other | Admitting: Internal Medicine

## 2013-10-16 ENCOUNTER — Encounter: Payer: Self-pay | Admitting: Internal Medicine

## 2013-10-16 ENCOUNTER — Other Ambulatory Visit: Payer: Self-pay | Admitting: *Deleted

## 2013-10-16 VITALS — BP 118/72 | HR 78 | Temp 97.7°F | Wt 213.0 lb

## 2013-10-16 DIAGNOSIS — C9000 Multiple myeloma not having achieved remission: Secondary | ICD-10-CM

## 2013-10-16 DIAGNOSIS — K130 Diseases of lips: Secondary | ICD-10-CM

## 2013-10-16 MED ORDER — SULFAMETHOXAZOLE-TMP DS 800-160 MG PO TABS: 1.0000 | ORAL_TABLET | Freq: Two times a day (BID) | ORAL | Status: DC

## 2013-10-16 NOTE — Telephone Encounter (Signed)
THIS REFILL REQUEST FOR REVLIMID WAS GIVEN TO DR.CHISM'S NURSE, ROBIN BASS,RN.

## 2013-10-16 NOTE — Patient Instructions (Addendum)
Cellulitis Cellulitis is an infection of the skin and the tissue beneath it. The infected area is usually red and tender. Cellulitis occurs most often in the arms and lower legs.  CAUSES  Cellulitis is caused by bacteria that enter the skin through cracks or cuts in the skin. The most common types of bacteria that cause cellulitis are Staphylococcus and Streptococcus. SYMPTOMS   Redness and warmth.  Swelling.  Tenderness or pain.  Fever. DIAGNOSIS  Your caregiver can usually determine what is wrong based on a physical exam. Blood tests may also be done. TREATMENT  Treatment usually involves taking an antibiotic medicine. HOME CARE INSTRUCTIONS   Take your antibiotics as directed. Finish them even if you start to feel better.  Keep the infected arm or leg elevated to reduce swelling.  Apply a warm cloth to the affected area up to 4 times per day to relieve pain.  Only take over-the-counter or prescription medicines for pain, discomfort, or fever as directed by your caregiver.  Keep all follow-up appointments as directed by your caregiver. SEEK MEDICAL CARE IF:   You notice red streaks coming from the infected area.  Your red area gets larger or turns dark in color.  Your bone or joint underneath the infected area becomes painful after the skin has healed.  Your infection returns in the same area or another area.  You notice a swollen bump in the infected area.  You develop new symptoms. SEEK IMMEDIATE MEDICAL CARE IF:   You have a fever.  You feel very sleepy.  You develop vomiting or diarrhea.  You have a general ill feeling (malaise) with muscle aches and pains. MAKE SURE YOU:   Understand these instructions.  Will watch your condition.  Will get help right away if you are not doing well or get worse. Document Released: 04/22/2005 Document Revised: 01/12/2012 Document Reviewed: 09/28/2011 [employer] Patient Information 2014 [employer].  

## 2013-10-16 NOTE — Progress Notes (Signed)
Subjective:    Patient ID: [name], male    DOB: 1946/03/29, 68 y.o.   MRN: [id number]  HPI  Pt presents to the clinic today with c/o lip swelling with possible infection. He reports this started 2 weeks ago. He reports that his lips are very chapped and burning. He has been putting neosporin on it. He denies fever. He reports that it is not draining. Additionally, he reports he has infected wisdom tooth. He reports that he has a dentist appt 10/30/13.  Review of Systems      Past Medical History  Diagnosis Date  . HTN (hypertension)   . Allergic rhinitis   . Hyperlipemia   . Hypercalcemia of malignancy   . Multiple myeloma 07/09/2011  . Myeloma kidney   . Peripheral neuropathy     toes, sees Dr Krista Blue  . Multiple myeloma   . Fractures     compression T12,L3  . Anemia 07/2010    secondary to tx rsponsive to [facility]  . Prostate cancer 2008    s/p prostatectomy  . Prostate cancer 03/2002  . Renal insufficiency   . History of chemotherapy     weekly Velcade,Cytoxan  . 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 DDD (degenerative disc disease)     Current Outpatient Prescriptions  Medication Sig Dispense Refill  . acyclovir (ZOVIRAX) 400 MG tablet Take 1 tablet (400 mg total) by mouth 2 (two) times daily.  180 tablet  1  . allopurinol (ZYLOPRIM) 100 MG tablet Take 1 tablet (100 mg total) by mouth daily.  90 tablet  3  . aspirin 81 MG tablet Take 81 mg by mouth daily.      Marland Kitchen dexamethasone (DECADRON) 4 MG tablet TAKE 3 TABLETS BY MOUTH EVERY WEEK  42 tablet  0  . fentaNYL (DURAGESIC - DOSED MCG/HR) 50 MCG/HR Place 1 patch (50 mcg total) onto the skin every 3 (three) days.  10 patch  0  . lenalidomide (REVLIMID) 15 MG capsule Take 1 capsule (15 mg total) by mouth daily. Take for 21 days then rest for 7 days  21 capsule  0  . lidocaine-prilocaine (EMLA) cream Apply topically as needed. Apply to port site one hour before treatment and  cover with plastic wrap  30 g  2  . NONFORMULARY OR COMPOUNDED ITEM Baclofn/amitrp/ketamn cream topically to feet TID prn for neuropathy      . pantoprazole (PROTONIX) 40 MG tablet Take 1 tablet (40 mg total) by mouth daily.  90 tablet  3  . prochlorperazine (COMPAZINE) 10 MG tablet Take 10 mg by mouth every 6 (six) hours as needed.       No current facility-administered medications for this visit.    Allergies  Allergen Reactions  . Celebrex [Celecoxib] Nausea Only  . Cymbalta [Duloxetine Hcl] Other (See Comments)    Weight loss  . Meloxicam Itching  . Percocet [Oxycodone-Acetaminophen] Itching  . Vicodin [Hydrocodone-Acetaminophen] Itching  . Lyrica [Pregabalin] Rash    Lesions    Family History  Problem Relation Age of Onset  . Pneumonia Mother   . Stroke Father   . Cancer Brother     lung  . Kidney cancer Sister     History   Social History  . Marital Status: Single    Spouse Name: N/A    Number of Children: N/A  . Years of Education: N/A   Occupational History  . 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 Smoking status: Former Smoker -- 2.00 packs/day for 30 years    Quit date: 07/27/1996  . Smokeless tobacco: Never Used  . Alcohol Use: No  . Drug Use: No  . Sexual Activity: No   Other Topics Concern  . Not on file   Social History Narrative  . No narrative on file     Constitutional: Denies fever, malaise, fatigue, headache or abrupt weight changes.  HEENT: Pt reports lip swelling. Denies eye pain, eye redness, ear pain, ringing in the ears, wax buildup, runny nose, nasal congestion, bloody nose, or sore throat. Respiratory: Denies difficulty breathing, shortness of breath, cough or sputum production.   Skin: Pt reports redness of bottom lower lip. Denies rashes, lesions or ulcercations.    No other specific complaints in a complete review of systems (except as listed in HPI above).  Objective:   Physical Exam   BP  118/72  Pulse 78  Temp(Src) 97.7 F (36.5 C) (Oral)  Wt 213 lb (96.616 kg) Wt Readings from Last 3 Encounters:  10/16/13 213 lb (96.616 kg)  08/24/13 212 lb (96.163 kg)  07/25/13 209 lb 8 oz (95.029 kg)    General: Appears his stated age, well developed, well nourished in NAD. Skin: Warm, dry and intact. Cellulitis of lower lip noted. HEENT: Head: normal shape and size; Eyes: sclera white, no icterus, conjunctiva pink, PERRLA and EOMs intact; Ears: Tm's gray and intact, normal light reflex; Nose: mucosa pink and moist, septum midline; Throat/Mouth: Teeth present, mucosa pink and moist, no exudate, lesions or ulcerations noted. 2+ swelling of the bottom lip noted. Cardiovascular: Normal rate and rhythm. S1,S2 noted.  No murmur, rubs or gallops noted. No JVD or BLE edema. No carotid bruits noted. Pulmonary/Chest: Normal effort and positive vesicular breath sounds. No respiratory distress. No wheezes, rales or ronchi noted.     BMET    Component Value Date/Time   NA 141 09/22/2013 1119   NA 141 06/23/2013 1627   K 3.6 09/22/2013 1119   K 3.5 06/23/2013 1627   CL 104 06/23/2013 1627   CL 110* 12/29/2012 1151   CO2 21* 09/22/2013 1119   CO2 22 06/23/2013 1627   GLUCOSE 106 09/22/2013 1119   GLUCOSE 88 06/23/2013 1627   GLUCOSE 127* 12/29/2012 1151   BUN 25.4 09/22/2013 1119   BUN 26* 06/23/2013 1627   CREATININE 1.1 09/22/2013 1119   CREATININE 1.22 06/23/2013 1627   CALCIUM 8.7 09/22/2013 1119   CALCIUM 9.0 06/23/2013 1627   GFRNONAA 55* 10/13/2011 0817   GFRAA 64* 10/13/2011 0817    Lipid Panel     Component Value Date/Time   CHOL 170 02/15/2013 0801   TRIG 158.0* 02/15/2013 0801   HDL 41.70 02/15/2013 0801   CHOLHDL 4 02/15/2013 0801   VLDL 31.6 02/15/2013 0801   LDLCALC 97 02/15/2013 0801    CBC    Component Value Date/Time   WBC 4.9 09/22/2013 1118   WBC 7.6 07/29/2012 0742   RBC 3.13* 09/22/2013 1118   RBC 3.43* 07/29/2012 0742   RBC 2.48* 07/04/2011 0901   HGB 11.4* 09/22/2013  1118   HGB 11.8* 07/29/2012 0742   HCT 34.3* 09/22/2013 1118   HCT 34.4* 07/29/2012 0742   PLT 82* 09/22/2013 1118   PLT 166 07/29/2012 0742   MCV 109.5* 09/22/2013 1118   MCV 100.3* 07/29/2012 0742   MCH 36.4* 09/22/2013 1118   MCH 34.4* 07/29/2012 9323  MCHC 33.3 09/22/2013 1118   MCHC 34.3 07/29/2012 0742   RDW 15.1* 09/22/2013 1118   RDW 16.8* 07/29/2012 0742   LYMPHSABS 0.7* 09/22/2013 1118   LYMPHSABS 4.5* 07/05/2011 0700   MONOABS 1.2* 09/22/2013 1118   MONOABS 4.7* 07/05/2011 0700   EOSABS 0.5 09/22/2013 1118   EOSABS 0.3 07/05/2011 0700   BASOSABS 0.0 09/22/2013 1118   BASOSABS 1.0* 07/05/2011 0700    Hgb A1C Lab Results  Component Value Date   HGBA1C 5.4 03/02/2013        Assessment & Plan:   Cellulitis of lower lip:  eRx for Septra BID x 5 days Follow up with derm this week and your oncologist this Friday If worse or difficulty breathing, go to the ER ASAP  RTC as needed

## 2013-10-16 NOTE — Progress Notes (Signed)
Pre visit review using our clinic review tool, if applicable. No additional management support is needed unless otherwise documented below in the visit note. 

## 2013-10-17 MED ORDER — LENALIDOMIDE 15 MG PO CAPS: 15.0000 mg | ORAL_CAPSULE | Freq: Every day | ORAL | Status: DC

## 2013-10-17 NOTE — Addendum Note (Signed)
Addended by: Wyonia Hough on: 10/17/2013 11:36 AM   Modules accepted: Orders

## 2013-10-17 NOTE — Telephone Encounter (Signed)
RECEIVED A FAX FROM [facility]ERNING A CONFIRMATION OF FACSIMILE RECEIPT FOR PT. REFERRAL. 

## 2013-10-18 ENCOUNTER — Ambulatory Visit: Payer: Medicare Other | Admitting: Podiatry

## 2013-10-20 ENCOUNTER — Other Ambulatory Visit (HOSPITAL_BASED_OUTPATIENT_CLINIC_OR_DEPARTMENT_OTHER): Payer: Medicare Other

## 2013-10-20 ENCOUNTER — Other Ambulatory Visit: Payer: Self-pay | Admitting: *Deleted

## 2013-10-20 ENCOUNTER — Ambulatory Visit: Payer: Medicare Other

## 2013-10-20 ENCOUNTER — Telehealth: Payer: Self-pay | Admitting: Internal Medicine

## 2013-10-20 ENCOUNTER — Ambulatory Visit (HOSPITAL_BASED_OUTPATIENT_CLINIC_OR_DEPARTMENT_OTHER): Payer: Medicare Other | Admitting: Internal Medicine

## 2013-10-20 VITALS — BP 152/78 | HR 67 | Temp 97.7°F | Resp 19 | Ht 75.0 in | Wt 208.2 lb

## 2013-10-20 DIAGNOSIS — M869 Osteomyelitis, unspecified: Secondary | ICD-10-CM

## 2013-10-20 DIAGNOSIS — C9000 Multiple myeloma not having achieved remission: Secondary | ICD-10-CM

## 2013-10-20 DIAGNOSIS — R21 Rash and other nonspecific skin eruption: Secondary | ICD-10-CM

## 2013-10-20 DIAGNOSIS — N08 Glomerular disorders in diseases classified elsewhere: Secondary | ICD-10-CM

## 2013-10-20 DIAGNOSIS — Z8546 Personal history of malignant neoplasm of prostate: Secondary | ICD-10-CM

## 2013-10-20 DIAGNOSIS — M549 Dorsalgia, unspecified: Secondary | ICD-10-CM

## 2013-10-20 DIAGNOSIS — N289 Disorder of kidney and ureter, unspecified: Secondary | ICD-10-CM

## 2013-10-20 DIAGNOSIS — Z95828 Presence of other vascular implants and grafts: Secondary | ICD-10-CM

## 2013-10-20 LAB — COMPREHENSIVE METABOLIC PANEL (CC13)
ALBUMIN: 3.5 g/dL (ref 3.5–5.0)
ALT: 39 U/L (ref 0–55)
AST: 20 U/L (ref 5–34)
Alkaline Phosphatase: 49 U/L (ref 40–150)
Anion Gap: 13 mEq/L — ABNORMAL HIGH (ref 3–11)
BUN: 25.5 mg/dL (ref 7.0–26.0)
CALCIUM: 9.4 mg/dL (ref 8.4–10.4)
CHLORIDE: 108 meq/L (ref 98–109)
CO2: 20 mEq/L — ABNORMAL LOW (ref 22–29)
Creatinine: 1.4 mg/dL — ABNORMAL HIGH (ref 0.7–1.3)
Glucose: 84 mg/dl (ref 70–140)
Potassium: 3.8 mEq/L (ref 3.5–5.1)
SODIUM: 141 meq/L (ref 136–145)
TOTAL PROTEIN: 6.4 g/dL (ref 6.4–8.3)
Total Bilirubin: 0.7 mg/dL (ref 0.20–1.20)

## 2013-10-20 LAB — CBC WITH DIFFERENTIAL/PLATELET
BASO%: 0.8 % (ref 0.0–2.0)
Basophils Absolute: 0 10*3/uL (ref 0.0–0.1)
EOS%: 13.6 % — AB (ref 0.0–7.0)
Eosinophils Absolute: 0.6 10*3/uL — ABNORMAL HIGH (ref 0.0–0.5)
HCT: 35.4 % — ABNORMAL LOW (ref 38.4–49.9)
HGB: 11.9 g/dL — ABNORMAL LOW (ref 13.0–17.1)
LYMPH%: 16.6 % (ref 14.0–49.0)
MCH: 36.8 pg — AB (ref 27.2–33.4)
MCHC: 33.6 g/dL (ref 32.0–36.0)
MCV: 109.6 fL — AB (ref 79.3–98.0)
MONO#: 1.1 10*3/uL — ABNORMAL HIGH (ref 0.1–0.9)
MONO%: 24.2 % — AB (ref 0.0–14.0)
NEUT#: 2.1 10*3/uL (ref 1.5–6.5)
NEUT%: 44.8 % (ref 39.0–75.0)
PLATELETS: 73 10*3/uL — AB (ref 140–400)
RBC: 3.23 10*6/uL — AB (ref 4.20–5.82)
RDW: 15.8 % — ABNORMAL HIGH (ref 11.0–14.6)
WBC: 4.7 10*3/uL (ref 4.0–10.3)
lymph#: 0.8 10*3/uL — ABNORMAL LOW (ref 0.9–3.3)

## 2013-10-20 MED ORDER — SODIUM CHLORIDE 0.9 % IJ SOLN
10.0000 mL | INTRAMUSCULAR | Status: DC | PRN
  Administered 2013-10-20: 10 mL via INTRAVENOUS
  Filled 2013-10-20: qty 10

## 2013-10-20 MED ORDER — OXYCODONE HCL 5 MG PO TABS: 5.0000 mg | ORAL_TABLET | ORAL | Status: DC | PRN

## 2013-10-20 MED ORDER — FENTANYL 50 MCG/HR TD PT72: 50.0000 ug | MEDICATED_PATCH | TRANSDERMAL | Status: DC

## 2013-10-20 MED ORDER — HEPARIN SOD (PORK) LOCK FLUSH 100 UNIT/ML IV SOLN
500.0000 [IU] | Freq: Once | INTRAVENOUS | Status: AC
  Administered 2013-10-20: 500 [IU] via INTRAVENOUS
  Filled 2013-10-20: qty 5

## 2013-10-20 NOTE — Patient Instructions (Signed)

## 2013-10-20 NOTE — Telephone Encounter (Signed)
per pof to sch lab,Dr appt/sch for 5/22 pt aware af date & time

## 2013-10-23 NOTE — Progress Notes (Signed)
Hematology and Oncology Follow Up Visit  MERCER Boyer [id number] 10-31-1945 68 y.o. Jeremiah Lofts, MD  CHIEF COMPLAINT:  Multiple Myeloma  Principle Diagnosis: Kappa light chain multiple myeloma, ISS III with diagnosis established in December 2012.  Prior Therapy:  1. Velcade, Cytoxan, and Decadron on a weekly basis from 07/10/2011 through 10/23/2011; 2. High-dose chemotherapy on 11/23/2011, consisting of carmustine 600 mg IV. He received cytarabine 400 mg IV on 12/24/2011, 12/25/2011, 12/26/2011, and 12/27/2011. He received etoposide 300 mg IV daily from 12/24/2011 through 12/27/2011, four doses. He received melphalan 280 mg IV on 12/28/2011.  3. 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  Current therapy: -Revlimid 15 mg daily, 3 weeks on, 1 week off. Revlimid was started on 05/11/2012.  Decadron dose was 20 mg weekly as of 08/10/2012.  Because of cushingoid effects, the Decadron dose was decreased to 12 mg weekly starting on Wednesday, June 11th. Decadron was started at 40 mg weekly on 05/11/2012. Adjunctive therapy with Zometa 4 mg IV is being given every 2 months.  Zometa was started on 09/02/2012.  Dental clearance was obtained.  He is on fentanyl 50 and oxycodone prn.   Interim History:  [name] was seen today for followup of his kappa light chain multiple myeloma diagnosed in December 2012.   He was last seen by me  on 07/25/2013.  He continues his Revlimid 3 weeks out of every 4. This was started on 12/21/2012 and will conclude on 01/10/2014.  He is taking decadron 12 mg because of cushingoid effects.   In addition, he is on zometa 4 mg every month (was placed on hold due to current osteomyelitis but restarted on11/28/2014).  He also reports that his PSA is slowly rising and this may require future therapy by his urologist, Dr. Alinda Boyer. He remains active and reports backpain that he rates 2 out of 10 relieved with his fentanyl patch (increased to 50 mcg q 72 hours) and  oxycodones prn.  He reports rashes on his legs bilaterally and most recent a rash on his lips.  He has been on keflex and bactrim. He reports having a dental procedure to his left maxillary molar.   Problem list:  1. Kappa light chain multiple myeloma, ISS III with diagnosis established in December 2012 when the patient presented with marked anemia, thrombocytopenia and extensive bone marrow replacement with immature plasma cells/plasma blasts and circulating plasma cells in the peripheral blood. Bone marrow was carried out on 07/08/2011. In addition, the patient had severe renal insufficiency and hypercalcemia. He was requiring transfusions of platelets and red cells. He appeared to have limited bone disease characterized by mild compression fractures of T12 and L3. A bone density scan done a couple months preceding the diagnosis had been normal. Results of cytogenetic analysis revealed the presence of 2 clonal cell lines. The 1st cell line was chromosomally normal (20% of cells). 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 There was a gain of chromosome 7 and a marker chromosome. 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 FISH studies showed loss of chromosome 4, loss of chromosome 14, loss of chromosome 13, and loss of chromosome 17, i.e. 4 P-, 14 Q-, 13 Q- and 17 P-. These findings would be consistent with abnormalities associated with multiple myeloma. The patient received Velcade, Cytoxan, and Decadron on a weekly basis from 07/10/2011  through 10/23/2011. The patient developed pain in the region of his right hip during the month of April. An MRI of the right hip at [facility] on 11/19/2011 showed a destructive lesion of the right pubic body and adjacent rami with associated soft tissue component. There was marked heterogeneous abnormal marrow signal throughout the visualized marrow.  The patient also underwent an MRI of the lumbar spine with and without IV contrast on 11/19/2011. Multiple compression deformities were noted, most severely at L3, where there was slight retropulsion resulting in moderate canal stenosis. An MRI of the right knee on 12/01/2011 showed no evidence of multiple myeloma; however, there was a horizontal medial meniscus tear involving the body and posterior horn and degenerative changes. These studies were carried out at [facility]. The patient underwent radiation treatments to the right hemipelvis. He received apparently 10 treatments at [facility] with some improvement in pain. [name] then underwent high-dose chemotherapy on 11/23/2011, consisting of carmustine 600 mg IV. He received cytarabine 400 mg IV on 12/24/2011, 12/25/2011, 12/26/2011, and 12/27/2011. He received etoposide 300 mg IV daily from 12/24/2011 through 12/27/2011, four doses. He received melphalan 280 mg IV on 12/28/2011. He was scheduled to receive autologous stem cell reinfusion on 12/29/2011. The patient's initial hospitalization was from 12/23/2011 through 12/28/2011. He required readmission at [facility] from 01/06/2012 through 01/10/2012. He had developed fever, shaking chills, diarrhea, weakness, and hypotension. One out of 2 blood cultures grew out gram-positive cocci, felt to be staphylococcus from his Hickman catheter. He was treated with ceftazidime and vancomycin. Vancomycin was discontinued after a 10-day course on 01/15/2012. The patient was found to have progressive disease in late September 2013 as evidenced by an increase in his serum kappa light chains and serum protein M spike. It was recommended that the patient start Revlimid 15 mg daily, 3 weeks on, 1 week off, and Decadron 40 mg weekly, in conjunction with aspirin 81 mg daily. Those treatments started on 05/11/2012.  2. Pancytopenia at diagnosis secondary to extensive marrow replacement by multiple myeloma.  3. Renal insufficiency, markedly  improved since diagnosis.  4. Hypercalcemia at presentation, now resolved following treatment.  5. Anemia which developed in mid January 2012 secondary to treatment responsive to Aranesp.  6. History of prostate cancer dating back to September 2003, treated with robotic-assisted laparoscopic radical prostatectomy by Dr. Raynelle Bring.  The tumor was pathologic T3a N0 Mx.  Gleason score was 4+3=7 Tumor involved both lobes of the prostate with capsular extension and a positive margin focally at the left apex.  Pretreatment PSA was 4.87.  The patient did not receive postoperative radiation.  Recently, his PSA has been slowly rising.  PSA on 10/28/2012 was 0.32.  7. Hypertension.  8. Dyslipidemia.  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  10. Right-sided Port-A-Cath placement on 08/05/2011. This was subsequently removed when the patient was at [facility] in June 2013.  11. Kyphoplasty carried out by Dr. Luanne Bras on 10/13/2011 involving vertebral bodies T12, L2 and L3. Review of bone biopsy pathology from kyphoplasties done on 10/13/2011 showed scattered plasma cells with no kappa or lambda light chain restriction.  12. Destructive lesion of the right pubic body and adjacent rami with associated soft tissue component detected on MRI of the right hip on 11/19/2011 at [facility]. The patient received 10 radiation treatments at [facility] with improvement in pain.  13. Weight loss of approximately 35 pounds from May through June 2013. The patient had regained all his weight by December  2013.  14. Left-sided Port-A-Cath was placed by Interventional Radiology on 07/29/2012.   Medications: I have reviewed the patient's current medications.  Current Outpatient Prescriptions  Medication Sig Dispense Refill  . acyclovir (ZOVIRAX) 400 MG tablet Take 1 tablet (400 mg total) by mouth 2 (two) times daily.  180 tablet  1  . allopurinol (ZYLOPRIM) 100 MG tablet Take 1 tablet (100  mg total) by mouth daily.  90 tablet  3  . aspirin 81 MG tablet Take 81 mg by mouth daily.      Marland Kitchen dexamethasone (DECADRON) 4 MG tablet TAKE 3 TABLETS BY MOUTH EVERY WEEK  42 tablet  0  . fentaNYL (DURAGESIC - DOSED MCG/HR) 50 MCG/HR Place 1 patch (50 mcg total) onto the skin every 3 (three) days.  10 patch  0  . lenalidomide (REVLIMID) 15 MG capsule Take 1 capsule (15 mg total) by mouth daily. Take for 21 days then rest for 7 days  21 capsule  0  . lidocaine-prilocaine (EMLA) cream Apply topically as needed. Apply to port site one hour before treatment and cover with plastic wrap  30 g  2  . NONFORMULARY OR COMPOUNDED ITEM Baclofn/amitrp/ketamn cream topically to feet TID prn for neuropathy      . pantoprazole (PROTONIX) 40 MG tablet Take 1 tablet (40 mg total) by mouth daily.  90 tablet  3  . prochlorperazine (COMPAZINE) 10 MG tablet Take 10 mg by mouth every 6 (six) hours as needed.      Marland Kitchen oxyCODONE (OXY IR/ROXICODONE) 5 MG immediate release tablet Take 1 tablet (5 mg total) by mouth every 4 (four) hours as needed for severe pain.  60 tablet  0   Current Facility-Administered Medications  Medication Dose Route Frequency Provider Last Rate Last Dose  . sodium chloride 0.9 % injection 10 mL  10 mL Intravenous PRN Concha Norway, MD   10 mL at 10/20/13 1434   Allergies:  Allergies  Allergen Reactions  . Celebrex [Celecoxib] Nausea Only  . Cymbalta [Duloxetine Hcl] Other (See Comments)    Weight loss  . Meloxicam Itching  . Percocet [Oxycodone-Acetaminophen] Itching  . Vicodin [Hydrocodone-Acetaminophen] Itching  . Lyrica [Pregabalin] Rash    Lesions    Past Medical History, Surgical history, Social history, and Family History were reviewed and updated.  IMMUNIZATIONS:  Pneumovax was given on 04/28/2011 at the [facility]. He had a flu shot on 04/13/2012.   SMOKING HISTORY: The patient had smoked 2 packs of cigarettes a day for approximately 25 years but stopped smoking in  1998.  Review of Systems: Constitutional:  Negative for fever, chills, night sweats, anorexia, weight loss, pain. Cardiovascular: no chest pain or dyspnea on exertion Respiratory: no cough, shortness of breath, or wheezing Neurological: no TIA or stroke symptoms Dermatological: positive for erythrema around distal 2nd Left toe ENT: negative for - epistaxis Skin: Negative. Gastrointestinal: no abdominal pain, change in bowel habits, or black or bloody stools Genito-Urinary: no dysuria, trouble voiding, or hematuria Hematological and Lymphatic: negative for bleeding problems Musculoskeletal: positive for - joint pain, joint stiffness and pain in back - lower Remaining ROS negative.  Physical Exam: BP 152/78  Pulse 67  Temp(Src) 97.7 F (36.5 C) (Oral)  Resp 19  Ht '6\' 3"'  (1.905 m)  Wt 208 lb 3.2 oz (94.439 kg)  BMI 26.02 kg/m2 ECOG: 1 General appearance: alert, cooperative, appears stated age and no distress; younger than stated age.  HEENT: PERRLa; EOMi; Sclerae clear. Rash on upper lip.  Head: Normocephalic, without obvious abnormality, atraumatic Neck: no adenopathy, supple, symmetrical, trachea midline and thyroid not enlarged, symmetric, no tenderness/mass/nodules Lymph nodes: Cervical, supraclavicular, and axillary nodes normal. Heart:regular rate and rhythm, S1, S2 normal, no murmur, click, rub or gallop Lung:chest clear, no wheezing, rales, normal symmetric air entry Abdomen: soft, non-tender, without masses or organomegaly EXT:No peripheral edema Neuro: Non focal. Gait, within normal limits.     Lab Results: Lab Results  Component Value Date   WBC 4.7 10/20/2013   HGB 11.9* 10/20/2013   HCT 35.4* 10/20/2013   MCV 109.6* 10/20/2013   PLT 73* 10/20/2013     Chemistry      Component Value Date/Time   NA 141 10/20/2013 1150   NA 141 06/23/2013 1627   K 3.8 10/20/2013 1150   K 3.5 06/23/2013 1627   CL 104 06/23/2013 1627   CL 110* 12/29/2012 1151   CO2 20* 10/20/2013  1150   CO2 22 06/23/2013 1627   BUN 25.5 10/20/2013 1150   BUN 26* 06/23/2013 1627   CREATININE 1.4* 10/20/2013 1150   CREATININE 1.22 06/23/2013 1627      Component Value Date/Time   CALCIUM 9.4 10/20/2013 1150   CALCIUM 9.0 06/23/2013 1627   ALKPHOS 49 10/20/2013 1150   ALKPHOS 57 06/23/2013 1627   AST 20 10/20/2013 1150   AST 18 06/23/2013 1627   ALT 39 10/20/2013 1150   ALT 28 06/23/2013 1627   BILITOT 0.70 10/20/2013 1150   BILITOT 0.4 06/23/2013 1627     Results for [name], [name] (MRN [id number]) as of 07/26/2013 08:09  IMAGING STUDIES:  1. Two-view chest x-ray on 06/28/2011 shows some mild bronchitic changes.  2. Metastatic bone survey on 07/04/2011 showed no lytic lesions in the visualized axial or appendicular skeleton. There was mild superior end-plate compression deformities at T12 and L3.  3. CT-guided left iliac bone marrow aspirate and core biopsy was carried out on 07/08/2011.  4. MRI of the lumbar spine without IV contrast on 09/30/2011 showed progressive compression fractures at L2, L3 and T12. There is diffuse scattered osseous metastatic disease. There was lumbar spondylosis and degenerative disk disease causing various degrees of impingement at all levels between L2 and S1. Progressive collapse of L3 was noted with 75% loss of vertebral body height, whereas on the prior study of 07/04/2011 there was 20% loss of height. There was 6 mm of bony retropulsion. There was progressive loss of L2 vertebral body noted with 50% loss of height whereas formerly it was at 20% loss. There is a 50-60% loss of vertebral body height noted at T12 minimally increased from the prior study with 2 mm of bony retropulsion. There was a 2.3 cm hemangioma present at the L1 vertebral body.  5. Chest x-ray, 2 view, from 01/06/2012 carried out at [facility] showed increased interstitial opacities, possibly representing edema or  infection.  6. MRI of the right hip carried out at [facility] on 04/07/2012 and  compared with the prior MRI of the right hip on 11/19/2011 showed interval development of a linear area of abnormal signal seen through the site of previous soft tissue mass within the right superior pubic rami. Findings were felt to represent a pathologic fracture most likely due to the underlying tumor involvement and/or an element of underlying radiation change. There was also re-demonstration of myelomatous metastatic lesions seen within the right hemipelvis and right proximal femur. Findings appear to be similar to the previous study. There was interval improvement of the previously  visualized soft tissue mass involving the superior and inferior pubic rami on the right. There continued to be mild cortical expansion and destruction within this region.  7. Port-A-Cath placement with ultrasound and fluoroscopic guidance was carried out on 07/29/2012 by Interventional Radiology. The report indicates that the catheter was placed on the right side through the right internal jugular vein. However, I believe, the Port-A-Cath is actually situated on the left side. 8. Chest x-ray, 2-view, from 10/11/2012 showed no active or acute cardiopulmonary disease.  There was a compression fracture of either T5 or T6 with a 40% anterior wedge compression, which may have progressed slightly since the previous study of 07/07/2011. 9. Bone Survey (01/24/2013). Osseous demineralization. Prior spinal augmentation procedures at T12, L2, and L3. Old fracture posterior right eighth rib. Degenerative disc facet disease changes cervical spine. Chronic anterior height loss T5. New mixed lytic and sclerotic process involving the right pubic body into the right superior pubic ramus, question healing fracture versus metastatic lesion; correlation with patient history and any recent prior outside imaging the patient may have. May also consider MR imaging of the pelvis for further evaluation. 10. MRI of the Pelvis (02/24/2013). Myeloma lesion  of the right pubic body with adjacent pathologic fractures. Numerous small myeloma lesions throughout the pelvic bones, proximal femurs, and distal lumbar spine.  Impression and Plan:  1. Kappa light chain multiple myeloma, ISS III.  -- Treatment. Continue Revlimid 15 mg daily, 3 weeks on, 1 week off, and Decadron 12 mg weekly inconjunction with aspirin 81 mg daily.  Decadron dose modified secondary to cushiongoid side effect.  His kappa chains appear very low and clinically he is doing well.  We will continue current dose and check CBCs weekly and hold his revlimid if his plt count is less than 30K; then, we will resume it at 10 mg daily, 3 weeks on, 1 week off.    -- Adjuntive therapy.  He is on zometa monthly.  Those treatments  Initially started on 05/11/2012 bimonthly and was held as noted above and resumed on 06/23/2013. Given his upcoming dental procedure, we will HOLD zometa.  --Complications including pathological fracture of right pelvis s/p XRT and secondary backpain, renal insufficiency, hypercalcemia (now resolved).  His pain is controlled on fentanyl patch to 50 mcg/hr/3 days. A prescription for prn oxycodone was provided today.  2. Rashes, recurrent. --Patient will complete bactrim for his lip rash.  He has a dermatology appointment next week.    3. History of prostate cancer dating back to September 2003, treated with robotic-assisted laparoscopic radical prostatectomy by Dr. Raynelle Bring.  The tumor was pathologic T3a N0 Mx.  Gleason score was 4+3=7 Tumor involved both lobes of the prostate with capsular extension and a positive margin focally at the left apex.  Pretreatment PSA was 4.87.  The patient did not receive postoperative radiation.  Recently, his PSA has been slowly rising.  He will follow up with urology for treatment options.   4. RTC in 2 months.  We will obtain CBC, CMP, quantitative immunoglobulins, serum light chain and immunofixation electrophoresis.     Spent more  than half the time coordinating care.    Marquavion Venhuizen, MD 10/23/2013 8:23 am

## 2013-10-24 NOTE — Telephone Encounter (Signed)
RECEIVED A FAX FROM [employer] CONCERNING A CONFIRMATION OF PRESCRIPTION SHIPMENT FOR REVLIMID ON 10/23/13.

## 2013-10-29 ENCOUNTER — Encounter: Payer: Self-pay | Admitting: Internal Medicine

## 2013-11-14 ENCOUNTER — Other Ambulatory Visit: Payer: Self-pay | Admitting: *Deleted

## 2013-11-14 DIAGNOSIS — C9000 Multiple myeloma not having achieved remission: Secondary | ICD-10-CM

## 2013-11-14 MED ORDER — LENALIDOMIDE 15 MG PO CAPS: 15.0000 mg | ORAL_CAPSULE | Freq: Every day | ORAL | Status: DC

## 2013-11-14 NOTE — Addendum Note (Signed)
Addended by: Wyonia Hough on: 11/14/2013 11:27 AM   Modules accepted: Orders

## 2013-11-14 NOTE — Telephone Encounter (Signed)
THIS REFILL REQUEST FOR REVLIMID WAS GIVEN TO DR.CHISM'S NURSE, ROBIN BASS,RN.

## 2013-11-21 NOTE — Telephone Encounter (Signed)
RECEIVED A FAX FROM [employer] CONCERNING A CONFIRMATION OF PRESCRIPTION SHIPMENT FOR REVLIMID ON 11/20/13.

## 2013-11-27 ENCOUNTER — Telehealth: Payer: Self-pay

## 2013-11-27 NOTE — Telephone Encounter (Signed)
Faxed [place] assistance program pateient enrollment application back to them.

## 2013-11-28 ENCOUNTER — Telehealth: Payer: Self-pay

## 2013-11-28 DIAGNOSIS — L0390 Cellulitis, unspecified: Secondary | ICD-10-CM

## 2013-11-28 MED ORDER — CEPHALEXIN 500 MG PO CAPS: 500.0000 mg | ORAL_CAPSULE | Freq: Four times a day (QID) | ORAL | Status: DC

## 2013-11-28 NOTE — Telephone Encounter (Signed)
Pt called and stated he skinned his shin again and it looks just like last time when Dr Juliann Mule ordered an antibiotic. Requesting antibiotic again. Dr Juliann Mule agreed. Pt aware and will pick up Rx at [facility] this evening.

## 2013-12-04 ENCOUNTER — Other Ambulatory Visit: Payer: Self-pay

## 2013-12-04 DIAGNOSIS — C9000 Multiple myeloma not having achieved remission: Secondary | ICD-10-CM

## 2013-12-04 DIAGNOSIS — M869 Osteomyelitis, unspecified: Secondary | ICD-10-CM

## 2013-12-04 MED ORDER — FENTANYL 50 MCG/HR TD PT72: 50.0000 ug | MEDICATED_PATCH | TRANSDERMAL | Status: DC

## 2013-12-08 ENCOUNTER — Encounter: Payer: Self-pay | Admitting: Internal Medicine

## 2013-12-11 ENCOUNTER — Encounter: Payer: Self-pay | Admitting: Internal Medicine

## 2013-12-12 ENCOUNTER — Other Ambulatory Visit: Payer: Self-pay | Admitting: *Deleted

## 2013-12-12 DIAGNOSIS — C9000 Multiple myeloma not having achieved remission: Secondary | ICD-10-CM

## 2013-12-12 NOTE — Telephone Encounter (Signed)
THIS REFILL REQUEST FOR REVLIMID WAS GIVEN TO DR.CHISM'S NURSE, KATHY BUYCK,RN.

## 2013-12-13 MED ORDER — LENALIDOMIDE 15 MG PO CAPS: 15.0000 mg | ORAL_CAPSULE | Freq: Every day | ORAL | Status: DC

## 2013-12-13 NOTE — Telephone Encounter (Signed)
Noted Dr. Juliann Mule has responded to patient instructing to continue aspirin.

## 2013-12-13 NOTE — Addendum Note (Signed)
Addended by: Tania Ade on: 12/13/2013 11:06 AM   Modules accepted: Orders

## 2013-12-15 ENCOUNTER — Other Ambulatory Visit (HOSPITAL_BASED_OUTPATIENT_CLINIC_OR_DEPARTMENT_OTHER): Payer: Medicare Other

## 2013-12-15 ENCOUNTER — Ambulatory Visit (HOSPITAL_BASED_OUTPATIENT_CLINIC_OR_DEPARTMENT_OTHER): Payer: Medicare Other | Admitting: Internal Medicine

## 2013-12-15 ENCOUNTER — Telehealth: Payer: Self-pay | Admitting: Internal Medicine

## 2013-12-15 ENCOUNTER — Encounter: Payer: Self-pay | Admitting: Internal Medicine

## 2013-12-15 ENCOUNTER — Ambulatory Visit (HOSPITAL_BASED_OUTPATIENT_CLINIC_OR_DEPARTMENT_OTHER): Payer: Medicare Other

## 2013-12-15 VITALS — BP 146/80 | HR 69 | Temp 96.8°F | Resp 18 | Ht 75.0 in | Wt 213.5 lb

## 2013-12-15 DIAGNOSIS — M869 Osteomyelitis, unspecified: Secondary | ICD-10-CM

## 2013-12-15 DIAGNOSIS — Z95828 Presence of other vascular implants and grafts: Secondary | ICD-10-CM

## 2013-12-15 DIAGNOSIS — C9000 Multiple myeloma not having achieved remission: Secondary | ICD-10-CM

## 2013-12-15 DIAGNOSIS — Z8546 Personal history of malignant neoplasm of prostate: Secondary | ICD-10-CM

## 2013-12-15 LAB — CBC WITH DIFFERENTIAL/PLATELET
BASO%: 0.2 % (ref 0.0–2.0)
Basophils Absolute: 0 10*3/uL (ref 0.0–0.1)
EOS ABS: 0.1 10*3/uL (ref 0.0–0.5)
EOS%: 2.8 % (ref 0.0–7.0)
HCT: 35.1 % — ABNORMAL LOW (ref 38.4–49.9)
HGB: 11.8 g/dL — ABNORMAL LOW (ref 13.0–17.1)
LYMPH#: 0.6 10*3/uL — AB (ref 0.9–3.3)
LYMPH%: 14.6 % (ref 14.0–49.0)
MCH: 37.8 pg — ABNORMAL HIGH (ref 27.2–33.4)
MCHC: 33.8 g/dL (ref 32.0–36.0)
MCV: 111.9 fL — ABNORMAL HIGH (ref 79.3–98.0)
MONO#: 1 10*3/uL — AB (ref 0.1–0.9)
MONO%: 26.2 % — ABNORMAL HIGH (ref 0.0–14.0)
NEUT%: 56.2 % (ref 39.0–75.0)
NEUTROS ABS: 2.2 10*3/uL (ref 1.5–6.5)
Platelets: 79 10*3/uL — ABNORMAL LOW (ref 140–400)
RBC: 3.13 10*6/uL — AB (ref 4.20–5.82)
RDW: 15.2 % — AB (ref 11.0–14.6)
WBC: 3.9 10*3/uL — AB (ref 4.0–10.3)

## 2013-12-15 LAB — BASIC METABOLIC PANEL (CC13)
ANION GAP: 10 meq/L (ref 3–11)
BUN: 27.6 mg/dL — AB (ref 7.0–26.0)
CALCIUM: 8.8 mg/dL (ref 8.4–10.4)
CO2: 22 meq/L (ref 22–29)
Chloride: 110 mEq/L — ABNORMAL HIGH (ref 98–109)
Creatinine: 1.3 mg/dL (ref 0.7–1.3)
GLUCOSE: 102 mg/dL (ref 70–140)
Potassium: 3.5 mEq/L (ref 3.5–5.1)
Sodium: 142 mEq/L (ref 136–145)

## 2013-12-15 LAB — LACTATE DEHYDROGENASE (CC13): LDH: 144 U/L (ref 125–245)

## 2013-12-15 MED ORDER — FENTANYL 75 MCG/HR TD PT72: 75.0000 ug | MEDICATED_PATCH | TRANSDERMAL | Status: DC

## 2013-12-15 MED ORDER — HEPARIN SOD (PORK) LOCK FLUSH 100 UNIT/ML IV SOLN
500.0000 [IU] | Freq: Once | INTRAVENOUS | Status: AC
  Administered 2013-12-15: 500 [IU] via INTRAVENOUS
  Filled 2013-12-15: qty 5

## 2013-12-15 MED ORDER — SODIUM CHLORIDE 0.9 % IJ SOLN
10.0000 mL | INTRAMUSCULAR | Status: DC | PRN
  Administered 2013-12-15: 10 mL via INTRAVENOUS
  Filled 2013-12-15: qty 10

## 2013-12-15 NOTE — Progress Notes (Signed)
Hematology and Oncology Follow Up Visit  [name] [id number] [birth year] 68 y.o. Jeremiah Lofts, MD  CHIEF COMPLAINT:  Multiple Myeloma  Principle Diagnosis: Kappa light chain multiple myeloma, ISS III with diagnosis established in December 2012.  Prior Therapy:  1. Velcade, Cytoxan, and Decadron on a weekly basis from 07/10/2011 through 10/23/2011; 2. High-dose chemotherapy on 11/23/2011, consisting of carmustine 600 mg IV. He received cytarabine 400 mg IV on 12/24/2011, 12/25/2011, 12/26/2011, and 12/27/2011. He received etoposide 300 mg IV daily from 12/24/2011 through 12/27/2011, four doses. He received melphalan 280 mg IV on 12/28/2011.  3. 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  Current therapy: -Revlimid 15 mg daily, 3 weeks on, 1 week off. Revlimid was started on 05/11/2012.  Decadron dose was 20 mg weekly as of 08/10/2012.  Because of cushingoid effects, the Decadron dose was decreased to 12 mg weekly starting on Wednesday, June 11th. Decadron was started at 40 mg weekly on 05/11/2012. Adjunctive therapy with Zometa 4 mg IV is being given every 2 months.  Zometa was started on 09/02/2012.  Dental clearance was obtained.  He is on fentanyl 50 and oxycodone prn.   Interim History:  [name] was seen today for followup of his kappa light chain multiple myeloma diagnosed in December 2012.   He was last seen by me  on 10/20/2013.  He continues his Revlimid 3 weeks out of every 4. This was started on 12/21/2012 and will conclude on 01/10/2014.  He is taking decadron 12 mg because of cushingoid effects.  Today, he is on his third off day.   In addition, he was on zometa 4 mg every month (was placed on hold due to current osteomyelitis but restarted on11/28/2014).  He now reports having dental work done and requests to be off zometa.  He also reports that his PSA is slowly rising and this may require future therapy by his urologist, Dr. Alinda Money. He remains active and  reports backpain that he rates 4 out of 10 relieved with his fentanyl patch (increased to 50 mcg q 72 hours) and oxycodones prn.  He notes that it lasts only 2 days instead of 3 days.  He follows with Dr. Samule Ohm annually.   Problem list:  1. Kappa light chain multiple myeloma, ISS III with diagnosis established in December 2012 when the patient presented with marked anemia, thrombocytopenia and extensive bone marrow replacement with immature plasma cells/plasma blasts and circulating plasma cells in the peripheral blood. Bone marrow was carried out on 07/08/2011. In addition, the patient had severe renal insufficiency and hypercalcemia. He was requiring transfusions of platelets and red cells. He appeared to have limited bone disease characterized by mild compression fractures of T12 and L3. A bone density scan done a couple months preceding the diagnosis had been normal. Results of cytogenetic analysis revealed the presence of 2 clonal cell lines. The 1st cell line was chromosomally normal (20% of cells). 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 There was a gain of chromosome 7 and a marker chromosome. 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 FISH studies showed loss of chromosome 4, loss of chromosome 14, loss of chromosome 13, and loss of chromosome 17, i.e. 4 P-, 14 Q-, 13 Q- and 17 P-. These findings would be consistent with abnormalities associated with multiple myeloma. The patient received Velcade, Cytoxan,  and Decadron on a weekly basis from 07/10/2011 through 10/23/2011. The patient developed pain in the region of his right hip during the month of April. An MRI of the right hip at [facility] on 11/19/2011 showed a destructive lesion of the right pubic body and adjacent rami with associated soft tissue component. There was marked heterogeneous abnormal marrow signal  throughout the visualized marrow. The patient also underwent an MRI of the lumbar spine with and without IV contrast on 11/19/2011. Multiple compression deformities were noted, most severely at L3, where there was slight retropulsion resulting in moderate canal stenosis. An MRI of the right knee on 12/01/2011 showed no evidence of multiple myeloma; however, there was a horizontal medial meniscus tear involving the body and posterior horn and degenerative changes. These studies were carried out at [facility]. The patient underwent radiation treatments to the right hemipelvis. He received apparently 10 treatments at [facility] with some improvement in pain. [name] then underwent high-dose chemotherapy on 11/23/2011, consisting of carmustine 600 mg IV. He received cytarabine 400 mg IV on 12/24/2011, 12/25/2011, 12/26/2011, and 12/27/2011. He received etoposide 300 mg IV daily from 12/24/2011 through 12/27/2011, four doses. He received melphalan 280 mg IV on 12/28/2011. He was scheduled to receive autologous stem cell reinfusion on 12/29/2011. The patient's initial hospitalization was from 12/23/2011 through 12/28/2011. He required readmission at [facility] from 01/06/2012 through 01/10/2012. He had developed fever, shaking chills, diarrhea, weakness, and hypotension. One out of 2 blood cultures grew out gram-positive cocci, felt to be staphylococcus from his Hickman catheter. He was treated with ceftazidime and vancomycin. Vancomycin was discontinued after a 10-day course on 01/15/2012. The patient was found to have progressive disease in late September 2013 as evidenced by an increase in his serum kappa light chains and serum protein M spike. It was recommended that the patient start Revlimid 15 mg daily, 3 weeks on, 1 week off, and Decadron 40 mg weekly, in conjunction with aspirin 81 mg daily. Those treatments started on 05/11/2012.  2. Pancytopenia at diagnosis secondary to extensive marrow replacement by multiple myeloma.   3. Renal insufficiency, markedly improved since diagnosis.  4. Hypercalcemia at presentation, now resolved following treatment.  5. Anemia which developed in mid January 2012 secondary to treatment responsive to Aranesp.  6. History of prostate cancer dating back to September 2003, treated with robotic-assisted laparoscopic radical prostatectomy by Dr. Raynelle Bring.  The tumor was pathologic T3a N0 Mx.  Gleason score was 4+3=7 Tumor involved both lobes of the prostate with capsular extension and a positive margin focally at the left apex.  Pretreatment PSA was 4.87.  The patient did not receive postoperative radiation.  Recently, his PSA has been slowly rising.  PSA on 10/28/2012 was 0.32.  7. Hypertension.  8. Dyslipidemia.  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  10. Right-sided Port-A-Cath placement on 08/05/2011. This was subsequently removed when the patient was at [facility] in June 2013.  11. Kyphoplasty carried out by Dr. Luanne Bras on 10/13/2011 involving vertebral bodies T12, L2 and L3. Review of bone biopsy pathology from kyphoplasties done on 10/13/2011 showed scattered plasma cells with no kappa or lambda light chain restriction.  12. Destructive lesion of the right pubic body and adjacent rami with associated soft tissue component detected on MRI of the right hip on 11/19/2011 at [facility]. The patient received 10 radiation treatments at [facility] with improvement in pain.  13. Weight loss of approximately 35 pounds from May through June 2013. The  patient had regained all his weight by December 2013.  14. Left-sided Port-A-Cath was placed by Interventional Radiology on 07/29/2012.   Medications: I have reviewed the patient's current medications.  Current Outpatient Prescriptions  Medication Sig Dispense Refill  . acyclovir (ZOVIRAX) 400 MG tablet Take 1 tablet (400 mg total) by mouth 2 (two) times daily.  180 tablet  1  . allopurinol  (ZYLOPRIM) 100 MG tablet Take 1 tablet (100 mg total) by mouth daily.  90 tablet  3  . aspirin 81 MG tablet Take 81 mg by mouth daily.      Marland Kitchen dexamethasone (DECADRON) 4 MG tablet TAKE 3 TABLETS BY MOUTH EVERY WEEK  42 tablet  0  . lenalidomide (REVLIMID) 15 MG capsule Take 1 capsule (15 mg total) by mouth daily. Take for 21 days then rest for 7 days  21 capsule  0  . lidocaine-prilocaine (EMLA) cream Apply topically as needed. Apply to port site one hour before treatment and cover with plastic wrap  30 g  2  . NONFORMULARY OR COMPOUNDED ITEM Baclofn/amitrp/ketamn cream topically to feet TID prn for neuropathy      . oxyCODONE (OXY IR/ROXICODONE) 5 MG immediate release tablet Take 1 tablet (5 mg total) by mouth every 4 (four) hours as needed for severe pain.  60 tablet  0  . pantoprazole (PROTONIX) 40 MG tablet Take 1 tablet (40 mg total) by mouth daily.  90 tablet  3  . prochlorperazine (COMPAZINE) 10 MG tablet Take 10 mg by mouth every 6 (six) hours as needed.      . fentaNYL (DURAGESIC - DOSED MCG/HR) 75 MCG/HR Place 1 patch (75 mcg total) onto the skin every 3 (three) days.  10 patch  0   No current facility-administered medications for this visit.   Allergies:  Allergies  Allergen Reactions  . Celebrex [Celecoxib] Nausea Only  . Cymbalta [Duloxetine Hcl] Other (See Comments)    Weight loss  . Meloxicam Itching  . Percocet [Oxycodone-Acetaminophen] Itching  . Vicodin [Hydrocodone-Acetaminophen] Itching  . Lyrica [Pregabalin] Rash    Lesions    Past Medical History, Surgical history, Social history, and Family History were reviewed and updated.  IMMUNIZATIONS:  Pneumovax was given on 04/28/2011 at the [facility]. He had a flu shot on 04/13/2012.   SMOKING HISTORY: The patient had smoked 2 packs of cigarettes a day for approximately 25 years but stopped smoking in 1998.  Review of Systems: Constitutional:  Negative for fever, chills, night sweats, anorexia, weight loss,  pain. Cardiovascular: no chest pain or dyspnea on exertion Respiratory: no cough, shortness of breath, or wheezing Neurological: no TIA or stroke symptoms Dermatological: positive for erythrema around distal 2nd Left toe ENT: negative for - epistaxis Skin: Negative. Gastrointestinal: no abdominal pain, change in bowel habits, or black or bloody stools Genito-Urinary: no dysuria, trouble voiding, or hematuria Hematological and Lymphatic: negative for bleeding problems Musculoskeletal: positive for - joint pain, joint stiffness and pain in back - lower Remaining ROS negative.  Physical Exam: BP 146/80  Pulse 69  Temp(Src) 96.8 F (36 C) (Oral)  Resp 18  Ht _0  (1.905 m)  Wt 213 lb 8 oz (96.843 kg)  BMI 26.69 kg/m2 ECOG: 1 General appearance: alert, cooperative, appears stated age and no distress; younger than stated age.  HEENT: PERRLa; EOMi; Sclerae clear.  Head: Normocephalic, without obvious abnormality, atraumatic Neck: no adenopathy, supple, symmetrical, trachea midline and thyroid not enlarged, symmetric, no tenderness/mass/nodules Lymph nodes: Cervical, supraclavicular, and  axillary nodes normal. Heart:regular rate and rhythm, S1, S2 normal, no murmur, click, rub or gallop Lung:chest clear, no wheezing, rales, normal symmetric air entry Abdomen: soft, non-tender, without masses or organomegaly EXT:No peripheral edema Neuro: Non focal. Gait, within normal limits.     Lab Results: Lab Results  Component Value Date   WBC 3.9* 12/15/2013   HGB 11.8* 12/15/2013   HCT 35.1* 12/15/2013   MCV 111.9* 12/15/2013   PLT 79* 12/15/2013     Chemistry      Component Value Date/Time   NA 142 12/15/2013 1443   NA 141 06/23/2013 1627   K 3.5 12/15/2013 1443   K 3.5 06/23/2013 1627   CL 104 06/23/2013 1627   CL 110* 12/29/2012 1151   CO2 22 12/15/2013 1443   CO2 22 06/23/2013 1627   BUN 27.6* 12/15/2013 1443   BUN 26* 06/23/2013 1627   CREATININE 1.3 12/15/2013 1443   CREATININE  1.22 06/23/2013 1627      Component Value Date/Time   CALCIUM 8.8 12/15/2013 1443   CALCIUM 9.0 06/23/2013 1627   ALKPHOS 49 10/20/2013 1150   ALKPHOS 57 06/23/2013 1627   AST 20 10/20/2013 1150   AST 18 06/23/2013 1627   ALT 39 10/20/2013 1150   ALT 28 06/23/2013 1627   BILITOT 0.70 10/20/2013 1150   BILITOT 0.4 06/23/2013 1627     Results for BRITTIN, JANIK (MRN [id number]) as of 07/26/2013 08:09  IMAGING STUDIES:  1. Two-view chest x-ray on 06/28/2011 shows some mild bronchitic changes.  2. Metastatic bone survey on 07/04/2011 showed no lytic lesions in the visualized axial or appendicular skeleton. There was mild superior end-plate compression deformities at T12 and L3.  3. CT-guided left iliac bone marrow aspirate and core biopsy was carried out on 07/08/2011.  4. MRI of the lumbar spine without IV contrast on 09/30/2011 showed progressive compression fractures at L2, L3 and T12. There is diffuse scattered osseous metastatic disease. There was lumbar spondylosis and degenerative disk disease causing various degrees of impingement at all levels between L2 and S1. Progressive collapse of L3 was noted with 75% loss of vertebral body height, whereas on the prior study of 07/04/2011 there was 20% loss of height. There was 6 mm of bony retropulsion. There was progressive loss of L2 vertebral body noted with 50% loss of height whereas formerly it was at 20% loss. There is a 50-60% loss of vertebral body height noted at T12 minimally increased from the prior study with 2 mm of bony retropulsion. There was a 2.3 cm hemangioma present at the L1 vertebral body.  5. Chest x-ray, 2 view, from 01/06/2012 carried out at [facility] showed increased interstitial opacities, possibly representing edema or  infection.  6. MRI of the right hip carried out at [facility] on 04/07/2012 and compared with the prior MRI of the right hip on 11/19/2011 showed interval development of a linear area of abnormal signal seen through the  site of previous soft tissue mass within the right superior pubic rami. Findings were felt to represent a pathologic fracture most likely due to the underlying tumor involvement and/or an element of underlying radiation change. There was also re-demonstration of myelomatous metastatic lesions seen within the right hemipelvis and right proximal femur. Findings appear to be similar to the previous study. There was interval improvement of the previously visualized soft tissue mass involving the superior and inferior pubic rami on the right. There continued to be mild cortical expansion and destruction within this  region.  7. Port-A-Cath placement with ultrasound and fluoroscopic guidance was carried out on 07/29/2012 by Interventional Radiology. The report indicates that the catheter was placed on the right side through the right internal jugular vein. However, I believe, the Port-A-Cath is actually situated on the left side. 8. Chest x-ray, 2-view, from 10/11/2012 showed no active or acute cardiopulmonary disease.  There was a compression fracture of either T5 or T6 with a 40% anterior wedge compression, which may have progressed slightly since the previous study of 07/07/2011. 9. Bone Survey (01/24/2013). Osseous demineralization. Prior spinal augmentation procedures at T12, L2, and L3. Old fracture posterior right eighth rib. Degenerative disc facet disease changes cervical spine. Chronic anterior height loss T5. New mixed lytic and sclerotic process involving the right pubic body into the right superior pubic ramus, question healing fracture versus metastatic lesion; correlation with patient history and any recent prior outside imaging the patient may have. May also consider MR imaging of the pelvis for further evaluation. 10. MRI of the Pelvis (02/24/2013). Myeloma lesion of the right pubic body with adjacent pathologic fractures. Numerous small myeloma lesions throughout the pelvic bones, proximal femurs, and  distal lumbar spine.  Impression and Plan:  1. Kappa light chain multiple myeloma, ISS III.  -- Treatment. Continue Revlimid 15 mg daily, 3 weeks on, 1 week off, and Decadron 12 mg weekly inconjunction with aspirin 81 mg daily.  Decadron dose modified secondary to cushiongoid side effect.  His kappa chains appear very low and clinically he is doing well.  We will continue current dose and check q 6 weeks and hold his revlimid if his plt count is less than 30K; then, we will resume it at 10 mg daily, 3 weeks on, 1 week off.    -- Adjuntive therapy.  He was on zometa monthly.  Those treatments  Initially started on 05/11/2012 bimonthly and was held as noted above and resumed on 06/23/2013. Given his  dental procedures, we will HOLD zometa.  --Complications including pathological fracture of right pelvis s/p XRT and secondary backpain, renal insufficiency, hypercalcemia (now resolved).  His pain is moderately controlled on fentanyl patch to 50 mcg/hr/3 days; however, he reports on the third day, he is in severe pain. Therefore, we will increase his dose to 75 mcg/hr/3ays. A prescription for prn oxycodone was provided today.   2. History of prostate cancer dating back to September 2003, treated with robotic-assisted laparoscopic radical prostatectomy by Dr. Raynelle Bring.  The tumor was pathologic T3a N0 Mx.  Gleason score was 4+3=7 Tumor involved both lobes of the prostate with capsular extension and a positive margin focally at the left apex.  Pretreatment PSA was 4.87.  The patient did not receive postoperative radiation.  Recently, his PSA has been slowly rising.  He will follow up with urology for treatment options.   3. RTC in 3 months.  We will obtain CBC, CMP, serum light chain and immunofixation electrophoresis q 6 weeks  Spent more than half the time coordinating care.    Concha Norway, MD 12/15/2013 5:04 pm.

## 2013-12-15 NOTE — Telephone Encounter (Signed)
gv adn printed appts cehd and avs for pt for July and Aug

## 2013-12-19 LAB — KAPPA/LAMBDA LIGHT CHAINS
KAPPA LAMBDA RATIO: 0.49 (ref 0.26–1.65)
Kappa free light chain: 1.02 mg/dL (ref 0.33–1.94)
Lambda Free Lght Chn: 2.08 mg/dL (ref 0.57–2.63)

## 2013-12-20 ENCOUNTER — Other Ambulatory Visit: Payer: Self-pay

## 2013-12-20 DIAGNOSIS — C9000 Multiple myeloma not having achieved remission: Secondary | ICD-10-CM

## 2013-12-20 MED ORDER — PANTOPRAZOLE SODIUM 40 MG PO TBEC: 40.0000 mg | DELAYED_RELEASE_TABLET | Freq: Every day | ORAL | Status: DC

## 2013-12-20 MED ORDER — ACYCLOVIR 400 MG PO TABS: 400.0000 mg | ORAL_TABLET | Freq: Two times a day (BID) | ORAL | Status: DC

## 2013-12-21 NOTE — Telephone Encounter (Signed)
[facility] sent facsimile confirmation of Revlimid prescription shipment.  Revlimid was shipped on 12-19-2013 with next business day delivery.

## 2013-12-26 ENCOUNTER — Other Ambulatory Visit: Payer: Self-pay

## 2013-12-26 DIAGNOSIS — C9000 Multiple myeloma not having achieved remission: Secondary | ICD-10-CM

## 2013-12-26 MED ORDER — ACYCLOVIR 400 MG PO TABS: 400.0000 mg | ORAL_TABLET | Freq: Two times a day (BID) | ORAL | Status: DC

## 2013-12-26 NOTE — Telephone Encounter (Signed)
Pt called stating [facility] sent the wrong med and he is out of acyclovir. S/w Dr Juliann Mule and we are sending acyclovir Rx locally until [facility] can send his 3 month supply.

## 2013-12-27 ENCOUNTER — Encounter: Payer: Self-pay | Admitting: Internal Medicine

## 2013-12-27 NOTE — Telephone Encounter (Signed)
My Chart Message copied to be reviewed by Dr Juliann Mule.

## 2013-12-28 ENCOUNTER — Other Ambulatory Visit: Payer: Self-pay | Admitting: Internal Medicine

## 2013-12-28 ENCOUNTER — Encounter: Payer: Self-pay | Admitting: Internal Medicine

## 2013-12-29 ENCOUNTER — Other Ambulatory Visit: Payer: Medicare Other

## 2013-12-29 ENCOUNTER — Ambulatory Visit: Payer: Medicare Other

## 2014-01-03 ENCOUNTER — Encounter: Payer: Self-pay | Admitting: Family Medicine

## 2014-01-03 ENCOUNTER — Ambulatory Visit (INDEPENDENT_AMBULATORY_CARE_PROVIDER_SITE_OTHER): Payer: Medicare Other | Admitting: Family Medicine

## 2014-01-03 VITALS — BP 120/70 | HR 87 | Temp 98.1°F | Ht 75.0 in | Wt 208.0 lb

## 2014-01-03 DIAGNOSIS — L03314 Cellulitis of groin: Secondary | ICD-10-CM

## 2014-01-03 DIAGNOSIS — L02219 Cutaneous abscess of trunk, unspecified: Secondary | ICD-10-CM

## 2014-01-03 DIAGNOSIS — C9000 Multiple myeloma not having achieved remission: Secondary | ICD-10-CM

## 2014-01-03 DIAGNOSIS — L03319 Cellulitis of trunk, unspecified: Secondary | ICD-10-CM

## 2014-01-03 MED ORDER — SULFAMETHOXAZOLE-TMP DS 800-160 MG PO TABS: 2.0000 | ORAL_TABLET | Freq: Two times a day (BID) | ORAL | Status: DC

## 2014-01-03 MED ORDER — CEPHALEXIN 500 MG PO CAPS: 1000.0000 mg | ORAL_CAPSULE | Freq: Two times a day (BID) | ORAL | Status: DC

## 2014-01-03 NOTE — Progress Notes (Signed)
Pre visit review using our clinic review tool, if applicable. No additional management support is needed unless otherwise documented below in the visit note. 

## 2014-01-03 NOTE — Progress Notes (Signed)
[name] [address] Phone: [phone] Fax: [phone]  Patient ID: [name] MRN: [id number], DOB: 09-10-1945, 68 y.o. Date of Encounter: 01/03/2014  Primary Physician:  [name], MD   Chief Complaint: Sore Spots in Groin Area  Subjective:   History of Present Illness:  [name] is a 68 y.o. pleasant patient who presents with the following:  68 year old patient with active multiple myeloma. He is currently under active chemotherapy treatment by oncology. He presents with 2 areas in his groin that R. More painful, red and mild to moderately tender to palpation. They're all the LEFT side. He has not had any trauma or any other incident. He has tried some topical medications at home without much significant success.  Sore in groin.  Multiple Myeloma patient.   Also with goin - 2 lesions on the left.   Patient Active Problem List   Diagnosis Date Noted  . Osteomyelitis of toe of left foot 05/08/2013  . Peripheral neuropathy 03/02/2013  . Osteoporosis, unspecified 10/28/2012  . Lesion of oral mucosa and sore throat 10/11/2012  . Cancer   . Anemia of renal disease 08/14/2011  . Acute renal failure 07/13/2011  . 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 Anemia of chronic disease 07/11/2011  . Multiple myeloma 07/09/2011  . Hypercalcemia 06/27/2011  . Thrombocytopenia 06/27/2011  . Lumbar compression fracture 06/10/2011  . BENIGN POSITIONAL VERTIGO 02/04/2010  . RECTAL MASS 01/29/2009  . HYPERTRIGLYCERIDEMIA 08/18/2007  . Hx of NEOP, MALIGNANT, PROSTATE 02/23/2007  . OBESITY 11/18/2006  . HYPERTENSION 11/18/2006   Past Medical History  Diagnosis Date  . HTN (hypertension)   . Allergic rhinitis   . Hyperlipemia   . Hypercalcemia of malignancy   . Multiple myeloma 07/09/2011  . Myeloma kidney   . Peripheral neuropathy     toes, sees Dr Krista Blue  . Multiple myeloma   . Fractures     compression T12,L3  . Anemia 07/2010    secondary to tx  rsponsive to Aranesp  . Prostate cancer 2008    s/p prostatectomy  . Prostate cancer 03/2002  . Renal insufficiency   . History of chemotherapy     weekly Velcade,Cytoxan  . 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 DDD (degenerative disc disease)    Past Surgical History  Procedure Laterality Date  . Kidney stone surgery    . Kidney stone surgery  06/2009    Stone removed from bladder  . Dexa  05/2011    spine 0.7, L femur 0.0, R femur -0.6, normal  . Prostatectomy  04/06/07  . Portacath placement  08/05/11    right sided portacath   History   Social History  . Marital Status: Single    Spouse Name: N/A    Number of Children: N/A  . 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 Smoking status: Former Smoker -- 2.00 packs/day for 30 years    Quit date: 07/27/1996  . Smokeless tobacco: Never Used  . Alcohol Use: No  . Drug Use: No  . Sexual Activity: No   Other Topics Concern  . Not on file   Social History Narrative  . No narrative on file   Family History  Problem Relation Age of Onset  . Pneumonia Mother   . Stroke Father   . Cancer Brother  lung  . Kidney cancer Sister    Allergies  Allergen Reactions  . Celebrex [Celecoxib] Nausea Only  . Cymbalta [Duloxetine Hcl] Other (See Comments)    Weight loss  . Meloxicam Itching  . Percocet [Oxycodone-Acetaminophen] Itching  . Vicodin [Hydrocodone-Acetaminophen] Itching  . Lyrica [Pregabalin] Rash    Lesions   Medication list has been reviewed and updated.  Review of Systems:  GEN: No acute illnesses, no fevers, chills. GI: No n/v/d, eating normally Pulm: No SOB Interactive and getting along well at home.  Otherwise, ROS is as per the HPI.  Objective:   Physical Examination: BP 120/70  Pulse 87  Temp(Src) 98.1 F (36.7 C) (Oral)  Ht $R'6\' 3"'mK$  (1.905 m)  Wt 208 lb (94.348 kg)  BMI  26.00 kg/m2   GEN: WDWN, NAD, Non-toxic, A & O x 3 HEENT: Atraumatic, Normocephalic. Neck supple. No masses, No LAD. Ears and Nose: No external deformity. CV: RRR, No M/G/R. No JVD. No thrill. No extra heart sounds. PULM: CTA B, no wheezes, crackles, rhonchi. No retractions. No resp. distress. No accessory muscle use. EXTR: No c/c/e NEURO Normal gait.  PSYCH: Normally interactive. Conversant. Not depressed or anxious appearing.  Calm demeanor.   GU / SKIN: on the LEFT side of the patient's groin there is some relatively small reddened areas that are indurated without any fluctuance.  Laboratory and Imaging Data:  Assessment & Plan:   Cellulitis of groin  Multiple myeloma  Early cellulitis and abscess formation. Double coverage with antibiotics.  The patient is going out of town to [place] [place] in a couple of days. Ideally, I would've liked to seeing him in followup little bit more closely, but we will recheck him on Monday. He understands that if he decompensated to he will need to seek medical care urgently while out of town.  New Prescriptions   CEPHALEXIN (KEFLEX) 500 MG CAPSULE    Take 2 capsules (1,000 mg total) by mouth 2 (two) times daily.   SULFAMETHOXAZOLE-TRIMETHOPRIM (BACTRIM DS) 800-160 MG PER TABLET    Take 2 tablets by mouth 2 (two) times daily.   Modified Medications   No medications on file   No orders of the defined types were placed in this encounter.   Follow-up: Return in about 5 days (around 01/08/2014). Unless noted above, the patient is to follow-up if symptoms worsen. Red flags were reviewed with the patient.  Signed,  Maud Deed. Vali Capano, MD, CA[facility] Sports Medicine   Discontinued Medications   No medications on file   Current Medications at Discharge:   Medication List       This list is accurate as of: 01/03/14  6:54 PM.  Always use your most recent med list.               acyclovir 400 MG tablet  Commonly known as:  ZOVIRAX  Take 1 tablet  (400 mg total) by mouth 2 (two) times daily.     allopurinol 100 MG tablet  Commonly known as:  ZYLOPRIM  Take 1 tablet (100 mg total) by mouth daily.     aspirin 81 MG tablet  Take 81 mg by mouth daily.     cephALEXin 500 MG capsule  Commonly known as:  KEFLEX  Take 2 capsules (1,000 mg total) by mouth 2 (two) times daily.     dexamethasone 4 MG tablet  Commonly known as:  DECADRON  TAKE 3 TABLETS BY MOUTH EVERY WEEK     fentaNYL 75 MCG/HR  Commonly known as:  [place] - dosed mcg/hr  Place 1 patch (75 mcg total) onto the skin every 3 (three) days.     lenalidomide 15 MG capsule  Commonly known as:  REVLIMID  Take 1 capsule (15 mg total) by mouth daily. Take for 21 days then rest for 7 days     lidocaine-prilocaine cream  Commonly known as:  EMLA  Apply topically as needed. Apply to port site one hour before treatment and cover with plastic wrap     NONFORMULARY OR COMPOUNDED ITEM  Baclofn/amitrp/ketamn cream topically to feet TID prn for neuropathy     oxyCODONE 5 MG immediate release tablet  Commonly known as:  Oxy IR/ROXICODONE  Take 1 tablet (5 mg total) by mouth every 4 (four) hours as needed for severe pain.     pantoprazole 40 MG tablet  Commonly known as:  PROTONIX  Take 1 tablet (40 mg total) by mouth daily.     prochlorperazine 10 MG tablet  Commonly known as:  COMPAZINE  Take 10 mg by mouth every 6 (six) hours as needed.     sulfamethoxazole-trimethoprim 800-160 MG per tablet  Commonly known as:  BACTRIM DS  Take 2 tablets by mouth 2 (two) times daily.

## 2014-01-08 ENCOUNTER — Encounter: Payer: Self-pay | Admitting: Family Medicine

## 2014-01-08 ENCOUNTER — Ambulatory Visit (INDEPENDENT_AMBULATORY_CARE_PROVIDER_SITE_OTHER): Payer: Medicare Other | Admitting: Family Medicine

## 2014-01-08 VITALS — BP 116/70 | HR 84 | Temp 98.2°F | Ht 75.0 in | Wt 209.5 lb

## 2014-01-08 DIAGNOSIS — L0390 Cellulitis, unspecified: Secondary | ICD-10-CM

## 2014-01-08 DIAGNOSIS — L03319 Cellulitis of trunk, unspecified: Secondary | ICD-10-CM

## 2014-01-08 DIAGNOSIS — L0291 Cutaneous abscess, unspecified: Secondary | ICD-10-CM

## 2014-01-08 DIAGNOSIS — L03314 Cellulitis of groin: Secondary | ICD-10-CM

## 2014-01-08 DIAGNOSIS — L02219 Cutaneous abscess of trunk, unspecified: Secondary | ICD-10-CM

## 2014-01-08 MED ORDER — CEFTRIAXONE SODIUM 1 G IJ SOLR
1.0000 g | Freq: Once | INTRAMUSCULAR | Status: AC
  Administered 2014-01-08: 1 g via INTRAMUSCULAR

## 2014-01-08 NOTE — Progress Notes (Signed)
Pre visit review using our clinic review tool, if applicable. No additional management support is needed unless otherwise documented below in the visit note. 

## 2014-01-08 NOTE — Progress Notes (Signed)
[place] [address] Phone: [phone] Fax: [phone]  Patient ID: [name] MRN: [id number], DOB: August 29, 1945, 68 y.o. Date of Encounter: 01/08/2014  Primary Physician:  [name], MD   Chief Complaint: Follow-up  Subjective:   History of Present Illness:  [name] is a 68 y.o. pleasant patient who presents with the following:  Improved. Only taking Keflex 500 mg bid as opposed to 1000 mg BID. Improved and did ok at his beach trip. AF. Less pain.  01/03/2014 Last OV with Owens Loffler, MD  68 year old patient with active multiple myeloma. He is currently under active chemotherapy treatment by oncology. He presents with 2 areas in his groin that R. More painful, red and mild to moderately tender to palpation. They're all the LEFT side. He has not had any trauma or any other incident. He has tried some topical medications at home without much significant success.  Sore in groin.  Multiple Myeloma patient.   Also with goin - 2 lesions on the left.   Patient Active Problem List   Diagnosis Date Noted  . Osteomyelitis of toe of left foot 05/08/2013  . Peripheral neuropathy 03/02/2013  . Osteoporosis, unspecified 10/28/2012  . Lesion of oral mucosa and sore throat 10/11/2012  . Cancer   . Anemia of renal disease 08/14/2011  . Acute renal failure 07/13/2011  . 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 Anemia of chronic disease 07/11/2011  . Multiple myeloma 07/09/2011  . Hypercalcemia 06/27/2011  . Thrombocytopenia 06/27/2011  . Lumbar compression fracture 06/10/2011  . BENIGN POSITIONAL VERTIGO 02/04/2010  . RECTAL MASS 01/29/2009  . HYPERTRIGLYCERIDEMIA 08/18/2007  . Hx of NEOP, MALIGNANT, PROSTATE 02/23/2007  . OBESITY 11/18/2006  . HYPERTENSION 11/18/2006   Past Medical History  Diagnosis Date  . HTN (hypertension)   . Allergic rhinitis   . Hyperlipemia   . Hypercalcemia of malignancy   . Multiple myeloma 07/09/2011  . Myeloma  kidney   . Peripheral neuropathy     toes, sees Dr Krista Blue  . Multiple myeloma   . Fractures     compression T12,L3  . Anemia 07/2010    secondary to tx rsponsive to Aranesp  . Prostate cancer 2008    s/p prostatectomy  . Prostate cancer 03/2002  . Renal insufficiency   . History of chemotherapy     weekly Velcade,Cytoxan  . 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 DDD (degenerative disc disease)    Past Surgical History  Procedure Laterality Date  . Kidney stone surgery    . Kidney stone surgery  06/2009    Stone removed from bladder  . Dexa  05/2011    spine 0.7, L femur 0.0, R femur -0.6, normal  . Prostatectomy  04/06/07  . Portacath placement  08/05/11    right sided portacath   History   Social History  . Marital Status: Single    Spouse Name: N/A    Number of Children: N/A  . 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 Smoking status: Former Smoker -- 2.00 packs/day for 30 years    Quit date: 07/27/1996  . Smokeless tobacco: Never Used  . Alcohol Use: No  . Drug Use: No  . Sexual Activity: No   Other Topics Concern  . Not on file   Social History Narrative  .  No narrative on file   Family History  Problem Relation Age of Onset  . Pneumonia Mother   . Stroke Father   . Cancer Brother     lung  . Kidney cancer Sister    Allergies  Allergen Reactions  . Celebrex [Celecoxib] Nausea Only  . Cymbalta [Duloxetine Hcl] Other (See Comments)    Weight loss  . Meloxicam Itching  . Percocet [Oxycodone-Acetaminophen] Itching  . Vicodin [Hydrocodone-Acetaminophen] Itching  . Lyrica [Pregabalin] Rash    Lesions   Medication list has been reviewed and updated.  Review of Systems:  GEN: No acute illnesses, no fevers, chills. GI: No n/v/d, eating normally Pulm: No SOB Interactive and getting along well at home.  Otherwise, ROS is as per the  HPI.  Objective:   Physical Examination: Blood pressure 116/70, pulse 84, temperature 98.2 F (36.8 C), temperature source Oral, height 6' 3" (1.905 m), weight 209 lb 8 oz (95.029 kg).   GEN: WDWN, NAD, Non-toxic, A & O x 3 HEENT: Atraumatic, Normocephalic. Neck supple. No masses, No LAD. Ears and Nose: No external deformity. CV: RRR, No M/G/R. No JVD. No thrill. No extra heart sounds. PULM: CTA B, no wheezes, crackles, rhonchi. No retractions. No resp. distress. No accessory muscle use. EXTR: No c/c/e NEURO Normal gait.  PSYCH: Normally interactive. Conversant. Not depressed or anxious appearing.  Calm demeanor.   GU / SKIN: on the LEFT side of the patient's groin there is some relatively small reddened areas that are indurated without any fluctuance. These are less painful today and smaller.   Laboratory and Imaging Data:  Assessment & Plan:   Cellulitis of groin  Cellulitis - Plan: cefTRIAXone (ROCEPHIN) injection 1 g  I am going to give IM Rocephin given inadequate oral ABX dosing, now, then increase to appropriate oral ABX dosing.  01/03/2014 Last OV with  , MD  Early cellulitis and abscess formation. Double coverage with antibiotics.  The patient is going out of town to the beach in a couple of days. Ideally, I would've liked to seeing him in followup little bit more closely, but we will recheck him on Monday. He understands that if he decompensated to he will need to seek medical care urgently while out of town.  New Prescriptions   No medications on file   Modified Medications   No medications on file   No orders of the defined types were placed in this encounter.   Follow-up: No Follow-up on file. Unless noted above, the patient is to follow-up if symptoms worsen. Red flags were reviewed with the patient.  Signed,   T. , MD, [facility] Sports Medicine   Discontinued Medications   No medications on file   Current Medications at  Discharge:   Medication List       This list is accurate as of: 01/08/14 11:59 PM.  Always use your most recent med list.               acyclovir 400 MG tablet  Commonly known as:  ZOVIRAX  Take 1 tablet (400 mg total) by mouth 2 (two) times daily.     allopurinol 100 MG tablet  Commonly known as:  ZYLOPRIM  Take 1 tablet (100 mg total) by mouth daily.     aspirin 81 MG tablet  Take 81 mg by mouth daily.     cephALEXin 500 MG capsule  Commonly known as:  KEFLEX  Take 2 capsules (1,000 mg total) by mouth 2 (two) times   daily.     dexamethasone 4 MG tablet  Commonly known as:  DECADRON  TAKE 3 TABLETS BY MOUTH EVERY WEEK     fentaNYL 75 MCG/HR  Commonly known as:  DURAGESIC - dosed mcg/hr  Place 1 patch (75 mcg total) onto the skin every 3 (three) days.     lenalidomide 15 MG capsule  Commonly known as:  REVLIMID  Take 1 capsule (15 mg total) by mouth daily. Take for 21 days then rest for 7 days     lidocaine-prilocaine cream  Commonly known as:  EMLA  Apply topically as needed. Apply to port site one hour before treatment and cover with plastic wrap     NONFORMULARY OR COMPOUNDED ITEM  Baclofn/amitrp/ketamn cream topically to feet TID prn for neuropathy     oxyCODONE 5 MG immediate release tablet  Commonly known as:  Oxy IR/ROXICODONE  Take 1 tablet (5 mg total) by mouth every 4 (four) hours as needed for severe pain.     pantoprazole 40 MG tablet  Commonly known as:  PROTONIX  Take 1 tablet (40 mg total) by mouth daily.     prochlorperazine 10 MG tablet  Commonly known as:  COMPAZINE  Take 10 mg by mouth every 6 (six) hours as needed.     sulfamethoxazole-trimethoprim 800-160 MG per tablet  Commonly known as:  BACTRIM DS  Take 2 tablets by mouth 2 (two) times daily.          

## 2014-01-09 ENCOUNTER — Other Ambulatory Visit: Payer: Self-pay | Admitting: *Deleted

## 2014-01-09 DIAGNOSIS — C9000 Multiple myeloma not having achieved remission: Secondary | ICD-10-CM

## 2014-01-09 MED ORDER — LENALIDOMIDE 15 MG PO CAPS: 15.0000 mg | ORAL_CAPSULE | Freq: Every day | ORAL | Status: DC

## 2014-01-09 NOTE — Telephone Encounter (Signed)
THIS REFILL REQUEST FOR REVLIMID WAS GIVEN TO DR.CHISM'S NURSE, ROBIN BASS,RN.

## 2014-01-09 NOTE — Addendum Note (Signed)
Addended by: Wyonia Hough on: 01/09/2014 03:21 PM   Modules accepted: Orders

## 2014-01-16 NOTE — Telephone Encounter (Signed)
RECEIVED A FAX FROM [facility] CONCERNING A CONFIRMATION OF PRESCRIPTION SHIPMENT FOR REVLIMID ON 01/15/14.

## 2014-01-17 ENCOUNTER — Other Ambulatory Visit: Payer: Self-pay | Admitting: Internal Medicine

## 2014-01-24 ENCOUNTER — Other Ambulatory Visit: Payer: Self-pay | Admitting: Pharmacist

## 2014-01-24 DIAGNOSIS — C9000 Multiple myeloma not having achieved remission: Secondary | ICD-10-CM

## 2014-01-25 ENCOUNTER — Ambulatory Visit (HOSPITAL_BASED_OUTPATIENT_CLINIC_OR_DEPARTMENT_OTHER): Payer: Medicare Other

## 2014-01-25 ENCOUNTER — Ambulatory Visit: Payer: Medicare Other

## 2014-01-25 ENCOUNTER — Telehealth: Payer: Self-pay | Admitting: Internal Medicine

## 2014-01-25 ENCOUNTER — Other Ambulatory Visit (HOSPITAL_BASED_OUTPATIENT_CLINIC_OR_DEPARTMENT_OTHER): Payer: Medicare Other

## 2014-01-25 VITALS — BP 135/72 | Temp 97.1°F

## 2014-01-25 DIAGNOSIS — C9000 Multiple myeloma not having achieved remission: Secondary | ICD-10-CM

## 2014-01-25 DIAGNOSIS — Z95828 Presence of other vascular implants and grafts: Secondary | ICD-10-CM

## 2014-01-25 LAB — CBC WITH DIFFERENTIAL/PLATELET
BASO%: 0.3 % (ref 0.0–2.0)
Basophils Absolute: 0 10*3/uL (ref 0.0–0.1)
EOS%: 0.1 % (ref 0.0–7.0)
Eosinophils Absolute: 0 10*3/uL (ref 0.0–0.5)
HCT: 35.3 % — ABNORMAL LOW (ref 38.4–49.9)
HGB: 11.9 g/dL — ABNORMAL LOW (ref 13.0–17.1)
LYMPH%: 10.2 % — ABNORMAL LOW (ref 14.0–49.0)
MCH: 37.9 pg — ABNORMAL HIGH (ref 27.2–33.4)
MCHC: 33.7 g/dL (ref 32.0–36.0)
MCV: 112.4 fL — ABNORMAL HIGH (ref 79.3–98.0)
MONO#: 0.5 10*3/uL (ref 0.1–0.9)
MONO%: 9.2 % (ref 0.0–14.0)
NEUT#: 4.3 10*3/uL (ref 1.5–6.5)
NEUT%: 80.2 % — ABNORMAL HIGH (ref 39.0–75.0)
Platelets: 73 10*3/uL — ABNORMAL LOW (ref 140–400)
RBC: 3.14 10*6/uL — ABNORMAL LOW (ref 4.20–5.82)
RDW: 15.3 % — ABNORMAL HIGH (ref 11.0–14.6)
WBC: 5.4 10*3/uL (ref 4.0–10.3)
lymph#: 0.5 10*3/uL — ABNORMAL LOW (ref 0.9–3.3)

## 2014-01-25 LAB — BASIC METABOLIC PANEL (CC13)
Anion Gap: 11 mEq/L (ref 3–11)
BUN: 26.4 mg/dL — ABNORMAL HIGH (ref 7.0–26.0)
CO2: 21 mEq/L — ABNORMAL LOW (ref 22–29)
Calcium: 9.3 mg/dL (ref 8.4–10.4)
Chloride: 109 mEq/L (ref 98–109)
Creatinine: 1.2 mg/dL (ref 0.7–1.3)
Glucose: 115 mg/dl (ref 70–140)
Potassium: 4 mEq/L (ref 3.5–5.1)
Sodium: 141 mEq/L (ref 136–145)

## 2014-01-25 MED ORDER — SODIUM CHLORIDE 0.9 % IJ SOLN
10.0000 mL | INTRAMUSCULAR | Status: DC | PRN
  Administered 2014-01-25: 10 mL via INTRAVENOUS
  Filled 2014-01-25: qty 10

## 2014-01-25 MED ORDER — HEPARIN SOD (PORK) LOCK FLUSH 100 UNIT/ML IV SOLN
500.0000 [IU] | Freq: Once | INTRAVENOUS | Status: AC
  Administered 2014-01-25: 500 [IU] via INTRAVENOUS
  Filled 2014-01-25: qty 5

## 2014-01-25 MED ORDER — ZOLEDRONIC ACID 4 MG/100ML IV SOLN
4.0000 mg | Freq: Once | INTRAVENOUS | Status: AC
  Administered 2014-01-25: 4 mg via INTRAVENOUS
  Filled 2014-01-25: qty 100

## 2014-01-25 MED ORDER — SODIUM CHLORIDE 0.9 % IV SOLN
Freq: Once | INTRAVENOUS | Status: AC
  Administered 2014-01-25: 11:00:00 via INTRAVENOUS

## 2014-01-25 NOTE — Telephone Encounter (Signed)
, °

## 2014-01-25 NOTE — Patient Instructions (Signed)

## 2014-01-29 LAB — KAPPA/LAMBDA LIGHT CHAINS
Kappa free light chain: 0.74 mg/dL (ref 0.33–1.94)
Kappa:Lambda Ratio: 0.63 (ref 0.26–1.65)
Lambda Free Lght Chn: 1.17 mg/dL (ref 0.57–2.63)

## 2014-01-31 ENCOUNTER — Ambulatory Visit (INDEPENDENT_AMBULATORY_CARE_PROVIDER_SITE_OTHER): Payer: Medicare Other | Admitting: Family Medicine

## 2014-01-31 ENCOUNTER — Encounter: Payer: Self-pay | Admitting: Family Medicine

## 2014-01-31 VITALS — BP 120/70 | HR 88 | Temp 97.8°F | Ht 75.0 in | Wt 208.5 lb

## 2014-01-31 DIAGNOSIS — L03314 Cellulitis of groin: Secondary | ICD-10-CM

## 2014-01-31 DIAGNOSIS — L02219 Cutaneous abscess of trunk, unspecified: Secondary | ICD-10-CM

## 2014-01-31 DIAGNOSIS — R238 Other skin changes: Secondary | ICD-10-CM

## 2014-01-31 DIAGNOSIS — L03319 Cellulitis of trunk, unspecified: Secondary | ICD-10-CM

## 2014-01-31 DIAGNOSIS — T148XXA Other injury of unspecified body region, initial encounter: Secondary | ICD-10-CM

## 2014-01-31 MED ORDER — CEPHALEXIN 500 MG PO CAPS: 1000.0000 mg | ORAL_CAPSULE | Freq: Two times a day (BID) | ORAL | Status: DC

## 2014-01-31 NOTE — Progress Notes (Signed)
[place] [address] Phone: [phone] Fax: [phone]  Patient ID: [name] MRN: [id number], DOB: [birth year], 68 y.o. Date of Encounter: 01/31/2014  Primary Physician:  [name], MD   Chief Complaint: Cellulitis   Subjective:   History of Present Illness:  [name] is a 68 y.o. very pleasant male patient who presents with the following:  Scratched on his right arm. Looks kind of bad. This is a wound about the size of a quarter, but it is starting to heal.  L groin. Several weeks ago, saw him with some groin small abscesses, and place him on some antibiotics, Keflex as well as some bursa coverage. He ultimately did well, the spontaneously drained, but he has had some recurrence of some very small painful lesions in her groin.  On examination, he does have multiple areas of nodules that have been previously located there prior boils or other infections in the past.  Past Medical History, Surgical History, Social History, Family History, Problem List, Medications, and Allergies have been reviewed and updated if relevant.  Review of Systems:  GEN: No acute illnesses, no fevers, chills. GI: No n/v/d, eating normally Pulm: No SOB Interactive and getting along well at home.  Otherwise, ROS is as per the HPI.  Objective:   Physical Examination: BP 120/70  Pulse 88  Temp(Src) 97.8 F (36.6 C) (Oral)  Ht _0  (1.905 m)  Wt 208 lb 8 oz (94.575 kg)  BMI 26.06 kg/m2   GEN: WDWN, NAD, Non-toxic, A & O x 3 HEENT: Atraumatic, Normocephalic. Neck supple. No masses, No LAD. Ears and Nose: No external deformity. PULM: CTA B, no wheezes, crackles, rhonchi. No retractions. No resp. distress. No accessory muscle use. EXTR: No c/c/e NEURO Normal gait.  PSYCH: Normally interactive. Conversant. Not depressed or anxious appearing.  Calm demeanor.   Multiple skin abrasions or and various stages of healing, some of which have significant scapular  motion. In the patient's groin, more on the LEFT, there are some elevated boils in our mildly tender to touch there is no significant fluctuance at this point.  Laboratory and Imaging Data:  Assessment & Plan:   Cellulitis of groin  Multiple wounds of skin  Currently on treatment for multiple myeloma, he is on chronic dexamethasone, very weak skin, with significant skin breaking it is happening almost at all times. This is a 2nd round of groin cellulitis that he has had recently. This appears much better compared to his prior one, and I think we will likely be okay treating with Keflex only.  New Prescriptions   CEPHALEXIN (KEFLEX) 500 MG CAPSULE    Take 2 capsules (1,000 mg total) by mouth 2 (two) times daily.   Modified Medications   No medications on file   No orders of the defined types were placed in this encounter.   Follow-up: No Follow-up on file. Unless noted above, the patient is to follow-up if symptoms worsen. Red flags were reviewed with the patient.  Signed,  Maud Deed. Kennet Mccort, MD, [facility] Sports Medicine   Discontinued Medications   CEPHALEXIN (KEFLEX) 500 MG CAPSULE    Take 2 capsules (1,000 mg total) by mouth 2 (two) times daily.   SULFAMETHOXAZOLE-TRIMETHOPRIM (BACTRIM DS) 800-160 MG PER TABLET    Take 2 tablets by mouth 2 (two) times daily.   Current Medications at Discharge:   Medication List       This list is accurate as of: 01/31/14 11:59 PM.  Always use your most recent med list.               acyclovir 400 MG tablet  Commonly known as:  ZOVIRAX  Take 1 tablet (400 mg total) by mouth 2 (two) times daily.     allopurinol 100 MG tablet  Commonly known as:  ZYLOPRIM  Take 1 tablet (100 mg total) by mouth daily.     aspirin 81 MG tablet  Take 81 mg by mouth daily.     cephALEXin 500 MG capsule  Commonly known as:  KEFLEX  Take 2 capsules (1,000 mg total) by mouth 2 (two) times daily.     dexamethasone 4 MG tablet  Commonly known as:  DECADRON    TAKE 3 TABLETS BY MOUTH EVERY WEEK     fentaNYL 75 MCG/HR  Commonly known as:  DURAGESIC - dosed mcg/hr  Place 1 patch (75 mcg total) onto the skin every 3 (three) days.     lenalidomide 15 MG capsule  Commonly known as:  REVLIMID  Take 1 capsule (15 mg total) by mouth daily. Take for 21 days then rest for 7 days     lidocaine-prilocaine cream  Commonly known as:  EMLA  Apply topically as needed. Apply to port site one hour before treatment and cover with plastic wrap     NONFORMULARY OR COMPOUNDED ITEM  Baclofn/amitrp/ketamn cream topically to feet TID prn for neuropathy     oxyCODONE 5 MG immediate release tablet  Commonly known as:  Oxy IR/ROXICODONE  Take 1 tablet (5 mg total) by mouth every 4 (four) hours as needed for severe pain.     pantoprazole 40 MG tablet  Commonly known as:  PROTONIX  Take 1 tablet (40 mg total) by mouth daily.     prochlorperazine 10 MG tablet  Commonly known as:  COMPAZINE  Take 10 mg by mouth every 6 (six) hours as needed.

## 2014-01-31 NOTE — Progress Notes (Signed)
Pre visit review using our clinic review tool, if applicable. No additional management support is needed unless otherwise documented below in the visit note. 

## 2014-02-01 ENCOUNTER — Encounter: Payer: Self-pay | Admitting: Internal Medicine

## 2014-02-06 ENCOUNTER — Other Ambulatory Visit: Payer: Self-pay | Admitting: *Deleted

## 2014-02-06 NOTE — Telephone Encounter (Signed)
THIS REFILL REQUEST FOR REVLIMID WAS PLACED ON DR.CHISM'S NURSE'S DESK.

## 2014-02-07 ENCOUNTER — Other Ambulatory Visit: Payer: Self-pay | Admitting: *Deleted

## 2014-02-07 DIAGNOSIS — C9000 Multiple myeloma not having achieved remission: Secondary | ICD-10-CM

## 2014-02-07 MED ORDER — LENALIDOMIDE 15 MG PO CAPS: 15.0000 mg | ORAL_CAPSULE | Freq: Every day | ORAL | Status: DC

## 2014-02-07 MED ORDER — FENTANYL 75 MCG/HR TD PT72
75.0000 ug | MEDICATED_PATCH | TRANSDERMAL | Status: DC
Start: 2014-02-07 — End: 2014-03-13

## 2014-02-07 NOTE — Telephone Encounter (Signed)
Called patient to remind him of Revlimid precautions and to take monthly survey, he is requesting refill on Fentanyl as well.

## 2014-02-08 NOTE — Telephone Encounter (Signed)
RECEIVED A FAX FROM [facility] CONCERNING A CONFIRMATION OF FACSIMILE RECEIPT.

## 2014-02-12 NOTE — Telephone Encounter (Signed)
RECEIVED A FAX FROM [facility] CONCERNING A CONFIRMATION OF PRESCRIPTION SHIPMENT FOR REVLIMID ON 02/09/14.

## 2014-02-26 ENCOUNTER — Telehealth: Payer: Self-pay | Admitting: Internal Medicine

## 2014-02-26 NOTE — Telephone Encounter (Signed)
, °

## 2014-03-06 ENCOUNTER — Ambulatory Visit: Payer: Medicare Other

## 2014-03-06 ENCOUNTER — Other Ambulatory Visit: Payer: Self-pay | Admitting: *Deleted

## 2014-03-06 ENCOUNTER — Other Ambulatory Visit: Payer: Medicare Other

## 2014-03-06 NOTE — Telephone Encounter (Signed)
THIS REFILL REQUEST FOR REVLIMID WAS GIVEN TO DR.CHISM'S NURSE, KATHY BUYCK,RN.

## 2014-03-07 ENCOUNTER — Other Ambulatory Visit: Payer: Self-pay | Admitting: *Deleted

## 2014-03-07 DIAGNOSIS — C9000 Multiple myeloma not having achieved remission: Secondary | ICD-10-CM

## 2014-03-07 MED ORDER — LENALIDOMIDE 15 MG PO CAPS: 15.0000 mg | ORAL_CAPSULE | Freq: Every day | ORAL | Status: DC

## 2014-03-08 ENCOUNTER — Other Ambulatory Visit: Payer: Medicare Other

## 2014-03-08 ENCOUNTER — Ambulatory Visit: Payer: Medicare Other

## 2014-03-13 ENCOUNTER — Telehealth: Payer: Self-pay | Admitting: Internal Medicine

## 2014-03-13 ENCOUNTER — Ambulatory Visit: Payer: Medicare Other

## 2014-03-13 ENCOUNTER — Other Ambulatory Visit: Payer: Self-pay

## 2014-03-13 ENCOUNTER — Ambulatory Visit (HOSPITAL_BASED_OUTPATIENT_CLINIC_OR_DEPARTMENT_OTHER): Payer: Medicare Other | Admitting: Internal Medicine

## 2014-03-13 ENCOUNTER — Ambulatory Visit (HOSPITAL_BASED_OUTPATIENT_CLINIC_OR_DEPARTMENT_OTHER): Payer: Medicare Other

## 2014-03-13 ENCOUNTER — Telehealth: Payer: Self-pay | Admitting: *Deleted

## 2014-03-13 ENCOUNTER — Other Ambulatory Visit (HOSPITAL_BASED_OUTPATIENT_CLINIC_OR_DEPARTMENT_OTHER): Payer: Medicare Other

## 2014-03-13 VITALS — BP 136/79 | HR 65 | Temp 98.1°F | Resp 17 | Ht 75.0 in | Wt 206.6 lb

## 2014-03-13 DIAGNOSIS — M2550 Pain in unspecified joint: Secondary | ICD-10-CM

## 2014-03-13 DIAGNOSIS — C9000 Multiple myeloma not having achieved remission: Secondary | ICD-10-CM

## 2014-03-13 DIAGNOSIS — L539 Erythematous condition, unspecified: Secondary | ICD-10-CM

## 2014-03-13 DIAGNOSIS — Z95828 Presence of other vascular implants and grafts: Secondary | ICD-10-CM

## 2014-03-13 DIAGNOSIS — Z87891 Personal history of nicotine dependence: Secondary | ICD-10-CM

## 2014-03-13 DIAGNOSIS — Z8546 Personal history of malignant neoplasm of prostate: Secondary | ICD-10-CM

## 2014-03-13 DIAGNOSIS — L0889 Other specified local infections of the skin and subcutaneous tissue: Secondary | ICD-10-CM

## 2014-03-13 LAB — COMPREHENSIVE METABOLIC PANEL (CC13)
ALBUMIN: 3 g/dL — AB (ref 3.5–5.0)
ALK PHOS: 41 U/L (ref 40–150)
ALT: 17 U/L (ref 0–55)
AST: 16 U/L (ref 5–34)
Anion Gap: 8 mEq/L (ref 3–11)
BUN: 18.1 mg/dL (ref 7.0–26.0)
CO2: 22 mEq/L (ref 22–29)
CREATININE: 1.1 mg/dL (ref 0.7–1.3)
Calcium: 9.2 mg/dL (ref 8.4–10.4)
Chloride: 110 mEq/L — ABNORMAL HIGH (ref 98–109)
Glucose: 100 mg/dl (ref 70–140)
POTASSIUM: 3.8 meq/L (ref 3.5–5.1)
Sodium: 141 mEq/L (ref 136–145)
Total Bilirubin: 0.65 mg/dL (ref 0.20–1.20)
Total Protein: 5.7 g/dL — ABNORMAL LOW (ref 6.4–8.3)

## 2014-03-13 LAB — CBC WITH DIFFERENTIAL/PLATELET
BASO%: 2.1 % — ABNORMAL HIGH (ref 0.0–2.0)
BASOS ABS: 0.1 10*3/uL (ref 0.0–0.1)
EOS%: 4.9 % (ref 0.0–7.0)
Eosinophils Absolute: 0.2 10*3/uL (ref 0.0–0.5)
HCT: 35.6 % — ABNORMAL LOW (ref 38.4–49.9)
HGB: 11.9 g/dL — ABNORMAL LOW (ref 13.0–17.1)
LYMPH%: 20 % (ref 14.0–49.0)
MCH: 37 pg — AB (ref 27.2–33.4)
MCHC: 33.4 g/dL (ref 32.0–36.0)
MCV: 110.9 fL — ABNORMAL HIGH (ref 79.3–98.0)
MONO#: 0.8 10*3/uL (ref 0.1–0.9)
MONO%: 20.9 % — AB (ref 0.0–14.0)
NEUT#: 1.9 10*3/uL (ref 1.5–6.5)
NEUT%: 52.1 % (ref 39.0–75.0)
Platelets: 88 10*3/uL — ABNORMAL LOW (ref 140–400)
RBC: 3.21 10*6/uL — ABNORMAL LOW (ref 4.20–5.82)
RDW: 15.1 % — AB (ref 11.0–14.6)
WBC: 3.7 10*3/uL — ABNORMAL LOW (ref 4.0–10.3)
lymph#: 0.7 10*3/uL — ABNORMAL LOW (ref 0.9–3.3)

## 2014-03-13 MED ORDER — HEPARIN SOD (PORK) LOCK FLUSH 100 UNIT/ML IV SOLN
500.0000 [IU] | Freq: Once | INTRAVENOUS | Status: AC
  Administered 2014-03-13: 500 [IU] via INTRAVENOUS
  Filled 2014-03-13: qty 5

## 2014-03-13 MED ORDER — FENTANYL 75 MCG/HR TD PT72: 75.0000 ug | MEDICATED_PATCH | TRANSDERMAL | Status: DC

## 2014-03-13 MED ORDER — ZOLEDRONIC ACID 4 MG/100ML IV SOLN
4.0000 mg | Freq: Once | INTRAVENOUS | Status: AC
  Administered 2014-03-13: 4 mg via INTRAVENOUS
  Filled 2014-03-13: qty 100

## 2014-03-13 MED ORDER — DOXYCYCLINE HYCLATE 100 MG PO TABS: 100.0000 mg | ORAL_TABLET | Freq: Two times a day (BID) | ORAL | Status: DC

## 2014-03-13 MED ORDER — SODIUM CHLORIDE 0.9 % IJ SOLN
10.0000 mL | INTRAMUSCULAR | Status: DC | PRN
  Administered 2014-03-13: 10 mL via INTRAVENOUS
  Filled 2014-03-13: qty 10

## 2014-03-13 NOTE — Patient Instructions (Signed)
Questional MRSA Infection MRSA stands for methicillin-resistant Staphylococcus aureus. This type of infection is caused by Staphylococcus aureus bacteria that are no longer affected by the medicines used to kill them (drug resistant). Staphylococcus (staph) bacteria are normally found on the skin or in the nose of healthy people. In most cases, these bacteria do not cause infection. But if these resistant bacteria enter your body through a cut or sore, they can cause a serious infection on your skin or in other parts of your body. There is a slight chance that the staph on your skin or in your nose is MRSA. There are two types of MRSA infections:  Hospital-acquired MRSA is bacteria that you get in the hospital.  Community-acquired MRSA is bacteria that you get somewhere other than in a hospital. RISK FACTORS Hospital-acquired MRSA is more common. You could be at risk for this infection if you are in the hospital and you:  Have surgery or a procedure.  Have an IV access or a catheter tube placed in your body.  Have weak resistance to germs (weakened immune system).  Are elderly.  Are on kidney dialysis. You could be at risk for community-acquired MRSA if you have a break in your skin and come into contact with MRSA. This may happen if you:  Play sports where there is skin-to-skin contact.  Live in a crowded setting, like a dormitory or a [employer]  Share towels, razors, or sports equipment with other people. SYMPTOMS  Symptoms of hospital-acquired MRSA depend on where MRSA has spread. Symptoms may include:  Wound infection.  Skin infection.  Rash.  Pneumonia.  Fever and chills.  Difficulty breathing.  Chest pain. Community-acquired MRSA is most likely to start as a scratch or cut that becomes infected. Symptoms may include:  A pus-filled pimple.  A boil on your skin.  Pus draining from your skin.  A sore (abscess) under your skin or somewhere in your  body.  Fever with or without chills. DIAGNOSIS  The diagnosis of MRSA is made by taking a sample from an infected area and sending it to a lab for testing. A lab technician can grow (culture) MRSA and check it under a microscope. The cultured MRSA can be tested to see which type of antibiotic medicine will work to treat it. Newer tests can identify MRSA more quickly by testing bacteria samples for MRSA genes. Your health care provider can diagnose MRSA using samples from:   Cuts or wounds in infected areas.  Nasal swabs.  Saliva or cough specimens from deep in the lungs (sputum).  Urine.  Blood. You may also have:  Imaging studies (such as X-ray or MRI) to check if the infection has spread to the lungs, bones, or joints.  A culture and sensitivity test of blood or fluids from inside the joints. TREATMENT  Treatment depends on how severe, deep, or extensive the infection is. Very bad infections may require a hospital stay.  Some skin infections, such as a small boil or sore (abscess), may be treated by draining pus from the site of the infection.  More extensive surgery to drain pus may be necessary for deeper or more widespread soft tissue infections.  You may then have to take antibiotic medicine given by mouth or through a vein. You may start antibiotic treatment right away or after testing can be done to see what antibiotic medicine should be used. HOME CARE INSTRUCTIONS   Take your antibiotics as directed by your health care provider.  Take the medicine as prescribed until it is finished.  Avoid close contact with those around you as much as possible. Do not use towels, razors, toothbrushes, bedding, or other items that will be used by others.  Wash your hands frequently for 15 seconds with soap and water. Dry your hands with a clean or disposable towel.  When you are not able to wash your hands, use hand sanitizer that is more than 60 percent alcohol.  Wash towels, sheets,  or clothes in the washing machine with detergent and hot water. Dry them in a hot dryer.  Follow your health care provider's instructions for wound care. Wash your hands before and after changing your bandages.  Always shower after exercising.  Keep all cuts and scrapes clean and covered with a bandage.  Be sure to tell all your health care providers that you have MRSA so they are aware of your infection. SEEK MEDICAL CARE IF:  You have a cut, scrape, pimple, or boil that becomes red, swollen, or painful or has pus in it.  You have pus draining from your skin.  You have an abscess under your skin or somewhere in your body. SEEK IMMEDIATE MEDICAL CARE IF:   You have symptoms of a skin infection with a fever or chills.  You have trouble breathing.  You have chest pain.  You have a skin wound and you become nauseous or start vomiting. MAKE SURE YOU:  Understand these instructions.  Will watch your condition.  Will get help right away if you are not doing well or get worse. Document Released: 07/13/2005 Document Revised: 07/18/2013 Document Reviewed: 05/05/2013 [facility] Patient Information 2015 [place], [place]. This information is not intended to replace advice given to you by your health care provider. Make sure you discuss any questions you have with your health care provider. Doxycycline tablets or capsules What is this medicine? DOXYCYCLINE (dox i SYE kleen) is a tetracycline antibiotic. It kills certain bacteria or stops their growth. It is used to treat many kinds of infections, like dental, skin, respiratory, and urinary tract infections. It also treats acne, Lyme disease, malaria, and certain sexually transmitted infections. This medicine may be used for other purposes; ask your health care provider or pharmacist if you have questions. COMMON BRAND NAME(S): Acticlate, Adoxa, Adoxa CK, Adoxa Pak, Adoxa TT, Alodox, Avidoxy, Doxal, Monodox, Morgidox 1x, Morgidox 1x Kit,  Morgidox 2x, Morgidox 2x Kit, Ocudox, Vibra-Tabs, Vibramycin What should I tell my health care provider before I take this medicine? They need to know if you have any of these conditions: -liver disease -long exposure to sunlight like working outdoors -stomach problems like colitis -an unusual or allergic reaction to doxycycline, tetracycline antibiotics, other medicines, foods, dyes, or preservatives -pregnant or trying to get pregnant -breast-feeding How should I use this medicine? Take this medicine by mouth with a full glass of water. Follow the directions on the prescription label. It is best to take this medicine without food, but if it upsets your stomach take it with food. Take your medicine at regular intervals. Do not take your medicine more often than directed. Take all of your medicine as directed even if you think you are better. Do not skip doses or stop your medicine early. Talk to your pediatrician regarding the use of this medicine in children. Special care may be needed. While this drug may be prescribed for children as young as 79 years old for selected conditions, precautions do apply. Overdosage: If you think you have  taken too much of this medicine contact a poison control center or emergency room at once. NOTE: This medicine is only for you. Do not share this medicine with others. What if I miss a dose? If you miss a dose, take it as soon as you can. If it is almost time for your next dose, take only that dose. Do not take double or extra doses. What may interact with this medicine? -antacids -barbiturates -birth control pills -bismuth subsalicylate -carbamazepine -methoxyflurane -other antibiotics -phenytoin -vitamins that contain iron -warfarin This list may not describe all possible interactions. Give your health care provider a list of all the medicines, herbs, non-prescription drugs, or dietary supplements you use. Also tell them if you smoke, drink alcohol, or  use illegal drugs. Some items may interact with your medicine. What should I watch for while using this medicine? Tell your doctor or health care professional if your symptoms do not improve. Do not treat diarrhea with over the counter products. Contact your doctor if you have diarrhea that lasts more than 2 days or if it is severe and watery. Do not take this medicine just before going to bed. It may not dissolve properly when you lay down and can cause pain in your throat. Drink plenty of fluids while taking this medicine to also help reduce irritation in your throat. This medicine can make you more sensitive to the sun. Keep out of the sun. If you cannot avoid being in the sun, wear protective clothing and use sunscreen. Do not use sun lamps or tanning beds/booths. Birth control pills may not work properly while you are taking this medicine. Talk to your doctor about using an extra method of birth control. If you are being treated for a sexually transmitted infection, avoid sexual contact until you have finished your treatment. Your sexual partner may also need treatment. Avoid antacids, aluminum, calcium, magnesium, and iron products for 4 hours before and 2 hours after taking a dose of this medicine. If you are using this medicine to prevent malaria, you should still protect yourself from contact with mosquitos. Stay in screened-in areas, use mosquito nets, keep your body covered, and use an insect repellent. What side effects may I notice from receiving this medicine? Side effects that you should report to your doctor or health care professional as soon as possible: -allergic reactions like skin rash, itching or hives, swelling of the face, lips, or tongue -difficulty breathing -fever -itching in the rectal or genital area -pain on swallowing -redness, blistering, peeling or loosening of the skin, including inside the mouth -severe stomach pain or cramps -unusual bleeding or  bruising -unusually weak or tired -yellowing of the eyes or skin Side effects that usually do not require medical attention (report to your doctor or health care professional if they continue or are bothersome): -diarrhea -loss of appetite -nausea, vomiting This list may not describe all possible side effects. Call your doctor for medical advice about side effects. You may report side effects to FDA at 1-800-FDA-1088. Where should I keep my medicine? Keep out of the reach of children. Store at room temperature, below 30 degrees C (86 degrees F). Protect from light. Keep container tightly closed. Throw away any unused medicine after the expiration date. Taking this medicine after the expiration date can make you seriously ill. NOTE: This sheet is a summary. It may not cover all possible information. If you have questions about this medicine, talk to your doctor, pharmacist, or health care provider.  2015, [employer]. (2013-05-19 13:58:06) Cellulitis Cellulitis is an infection of the skin and the tissue beneath it. The infected area is usually red and tender. Cellulitis occurs most often in the arms and lower legs.  CAUSES  Cellulitis is caused by bacteria that enter the skin through cracks or cuts in the skin. The most common types of bacteria that cause cellulitis are staphylococci and streptococci. SIGNS AND SYMPTOMS   Redness and warmth.  Swelling.  Tenderness or pain.  Fever. DIAGNOSIS  Your health care provider can usually determine what is wrong based on a physical exam. Blood tests may also be done. TREATMENT  Treatment usually involves taking an antibiotic medicine. HOME CARE INSTRUCTIONS   Take your antibiotic medicine as directed by your health care provider. Finish the antibiotic even if you start to feel better.  Keep the infected arm or leg elevated to reduce swelling.  Apply a warm cloth to the affected area up to 4 times per day to relieve pain.  Take  medicines only as directed by your health care provider.  Keep all follow-up visits as directed by your health care provider. SEEK MEDICAL CARE IF:   You notice red streaks coming from the infected area.  Your red area gets larger or turns dark in color.  Your bone or joint underneath the infected area becomes painful after the skin has healed.  Your infection returns in the same area or another area.  You notice a swollen bump in the infected area.  You develop new symptoms.  You have a fever. SEEK IMMEDIATE MEDICAL CARE IF:   You feel very sleepy.  You develop vomiting or diarrhea.  You have a general ill feeling (malaise) with muscle aches and pains. MAKE SURE YOU:   Understand these instructions.  Will watch your condition.  Will get help right away if you are not doing well or get worse. Document Released: 04/22/2005 Document Revised: 11/27/2013 Document Reviewed: 09/28/2011 [facility] Patient Information 2015 [place], [place]. This information is not intended to replace advice given to you by your health care provider. Make sure you discuss any questions you have with your health care provider.

## 2014-03-13 NOTE — Progress Notes (Signed)
Hematology and Oncology Follow Up Visit  [name] [id number] July 19, 1946 67 y.o. Jeremiah Lofts, MD  CHIEF COMPLAINT:  Multiple Myeloma  Principle Diagnosis: Kappa light chain multiple myeloma, ISS III with diagnosis established in December 2012.  Prior Therapy:  1. Velcade, Cytoxan, and Decadron on a weekly basis from 07/10/2011 through 10/23/2011; 2. High-dose chemotherapy on 11/23/2011, consisting of carmustine 600 mg IV. He received cytarabine 400 mg IV on 12/24/2011, 12/25/2011, 12/26/2011, and 12/27/2011. He received etoposide 300 mg IV daily from 12/24/2011 through 12/27/2011, four doses. He received melphalan 280 mg IV on 12/28/2011.  3. 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  Current therapy: -Revlimid 15 mg daily, 3 weeks on, 1 week off. Revlimid was started on 05/11/2012.  Decadron dose was 20 mg weekly as of 08/10/2012.  Because of cushingoid effects, the Decadron dose was decreased to 12 mg weekly starting on Wednesday, June 11th. Decadron was started at 40 mg weekly on 05/11/2012. Adjunctive therapy with Zometa 4 mg IV is being given every 2 months.  Zometa was started on 09/02/2012.  Dental clearance was obtained.  He is on fentanyl 75 and oxycodone prn.   Interim History:  [name] was seen today for followup of his kappa light chain multiple myeloma diagnosed in December 2012.   He was last seen by me  on 12/15/2013.  He continues his Revlimid 3 weeks out of every 4.  He was taking decadron 12 mg because of cushingoid effects which has been tapered down to 6 mg daily.  He will restart his revlimid tomorrow.    In addition, he was on zometa 4 mg every month (was placed on hold due to current osteomyelitis but restarted on 06/23/2013).    He also reports that his PSA is slowly rising and this may require future therapy by his urologist, Dr. Alinda Boyer. He remains active and reports backpain that he rates 2 out of 10 relieved with his fentanyl patch (increased to  75 mcg q 72 hours) and oxycodones prn.   He follows with Dr. Samule Boyer annually.   Problem list:  1. Kappa light chain multiple myeloma, ISS III with diagnosis established in December 2012 when the patient presented with marked anemia, thrombocytopenia and extensive bone marrow replacement with immature plasma cells/plasma blasts and circulating plasma cells in the peripheral blood. Bone marrow was carried out on 07/08/2011. In addition, the patient had severe renal insufficiency and hypercalcemia. He was requiring transfusions of platelets and red cells. He appeared to have limited bone disease characterized by mild compression fractures of T12 and L3. A bone density scan done a couple months preceding the diagnosis had been normal. Results of cytogenetic analysis revealed the presence of 2 clonal cell lines. The 1st cell line was chromosomally normal (20% of cells). 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 There was a gain of chromosome 7 and a marker chromosome. 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 FISH studies showed loss of chromosome 4, loss of chromosome 14, loss of chromosome 13, and loss of chromosome 17, i.e. 4 P-, 14 Q-, 13 Q- and 17 P-. These findings would be consistent with abnormalities associated with multiple myeloma. The patient received Velcade, Cytoxan, and Decadron on a weekly basis from 07/10/2011 through 10/23/2011. The patient developed pain in the region of his right hip during the month of  April. An MRI of the right hip at [facility] on 11/19/2011 showed a destructive lesion of the right pubic body and adjacent rami with associated soft tissue component. There was marked heterogeneous abnormal marrow signal throughout the visualized marrow. The patient also underwent an MRI of the lumbar spine with and without IV contrast on 11/19/2011. Multiple  compression deformities were noted, most severely at L3, where there was slight retropulsion resulting in moderate canal stenosis. An MRI of the right knee on 12/01/2011 showed no evidence of multiple myeloma; however, there was a horizontal medial meniscus tear involving the body and posterior horn and degenerative changes. These studies were carried out at [facility]. The patient underwent radiation treatments to the right hemipelvis. He received apparently 10 treatments at [facility] with some improvement in pain. [name] then underwent high-dose chemotherapy on 11/23/2011, consisting of carmustine 600 mg IV. He received cytarabine 400 mg IV on 12/24/2011, 12/25/2011, 12/26/2011, and 12/27/2011. He received etoposide 300 mg IV daily from 12/24/2011 through 12/27/2011, four doses. He received melphalan 280 mg IV on 12/28/2011. He was scheduled to receive autologous stem cell reinfusion on 12/29/2011. The patient's initial hospitalization was from 12/23/2011 through 12/28/2011. He required readmission at [facility] from 01/06/2012 through 01/10/2012. He had developed fever, shaking chills, diarrhea, weakness, and hypotension. One out of 2 blood cultures grew out gram-positive cocci, felt to be staphylococcus from his Hickman catheter. He was treated with ceftazidime and vancomycin. Vancomycin was discontinued after a 10-day course on 01/15/2012. The patient was found to have progressive disease in late September 2013 as evidenced by an increase in his serum kappa light chains and serum protein M spike. It was recommended that the patient start Revlimid 15 mg daily, 3 weeks on, 1 week off, and Decadron 40 mg weekly, in conjunction with aspirin 81 mg daily. Those treatments started on 05/11/2012.  2. Pancytopenia at diagnosis secondary to extensive marrow replacement by multiple myeloma.  3. Renal insufficiency, markedly improved since diagnosis.  4. Hypercalcemia at presentation, now resolved following treatment.  5. Anemia  which developed in mid January 2012 secondary to treatment responsive to Aranesp.  6. History of prostate cancer dating back to September 2003, treated with robotic-assisted laparoscopic radical prostatectomy by Dr. Raynelle Bring.  The tumor was pathologic T3a N0 Mx.  Gleason score was 4+3=7 Tumor involved both lobes of the prostate with capsular extension and a positive margin focally at the left apex.  Pretreatment PSA was 4.87.  The patient did not receive postoperative radiation.  Recently, his PSA has been slowly rising.  PSA on 10/28/2012 was 0.32.  7. Hypertension.  8. Dyslipidemia.  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  10. Right-sided Port-A-Cath placement on 08/05/2011. This was subsequently removed when the patient was at [facility] in June 2013.  11. Kyphoplasty carried out by Dr. Luanne Bras on 10/13/2011 involving vertebral bodies T12, L2 and L3. Review of bone biopsy pathology from kyphoplasties done on 10/13/2011 showed scattered plasma cells with no kappa or lambda light chain restriction.  12. Destructive lesion of the right pubic body and adjacent rami with associated soft tissue component detected on MRI of the right hip on 11/19/2011 at [facility]. The patient received 10 radiation treatments at [facility] with improvement in pain.  13. Weight loss of approximately 35 pounds from May through June 2013. The patient had regained all his weight by December 2013.  14. Left-sided Port-A-Cath was placed by Interventional Radiology on 07/29/2012.   Medications: I have  reviewed the patient's current medications.  Current Outpatient Prescriptions  Medication Sig Dispense Refill  . acyclovir (ZOVIRAX) 400 MG tablet Take 1 tablet (400 mg total) by mouth 2 (two) times daily.  14 tablet  0  . allopurinol (ZYLOPRIM) 100 MG tablet Take 1 tablet (100 mg total) by mouth daily.  90 tablet  3  . aspirin 81 MG tablet Take 81 mg by mouth daily.      Marland Kitchen  dexamethasone (DECADRON) 4 MG tablet TAKE 3 TABLETS BY MOUTH EVERY WEEK  42 tablet  0  . lenalidomide (REVLIMID) 15 MG capsule Take 1 capsule (15 mg total) by mouth daily. Take for 21 days then rest for 7 days  21 capsule  0  . lidocaine-prilocaine (EMLA) cream Apply topically as needed. Apply to port site one hour before treatment and cover with plastic wrap  30 g  2  . NONFORMULARY OR COMPOUNDED ITEM Baclofn/amitrp/ketamn cream topically to feet TID prn for neuropathy      . oxyCODONE (OXY IR/ROXICODONE) 5 MG immediate release tablet Take 1 tablet (5 mg total) by mouth every 4 (four) hours as needed for severe pain.  60 tablet  0  . pantoprazole (PROTONIX) 40 MG tablet Take 1 tablet (40 mg total) by mouth daily.  90 tablet  3  . prochlorperazine (COMPAZINE) 10 MG tablet Take 10 mg by mouth every 6 (six) hours as needed.      . doxycycline (VIBRA-TABS) 100 MG tablet Take 1 tablet (100 mg total) by mouth 2 (two) times daily.  20 tablet  0  . fentaNYL (DURAGESIC - DOSED MCG/HR) 75 MCG/HR Place 1 patch (75 mcg total) onto the skin every 3 (three) days.  10 patch  0   No current facility-administered medications for this visit.   Allergies:  Allergies  Allergen Reactions  . Celebrex [Celecoxib] Nausea Only  . Cymbalta [Duloxetine Hcl] Other (See Comments)    Weight loss  . Meloxicam Itching  . Percocet [Oxycodone-Acetaminophen] Itching  . Vicodin [Hydrocodone-Acetaminophen] Itching  . Lyrica [Pregabalin] Rash    Lesions    Past Medical History, Surgical history, Social history, and Family History were reviewed and updated.  IMMUNIZATIONS:  Pneumovax was given on 04/28/2011 at the [facility]. He had a flu shot on 04/13/2012.   SMOKING HISTORY: The patient had smoked 2 packs of cigarettes a day for approximately 25 years but stopped smoking in 1998.  Review of Systems: Constitutional:  Negative for fever, chills, night sweats, anorexia, weight loss, pain. Cardiovascular: no chest  pain or dyspnea on exertion Respiratory: no cough, shortness of breath, or wheezing Neurological: no TIA or stroke symptoms Dermatological: positive for erythrema around distal 2nd Left toe ENT: negative for - epistaxis Skin: Negative. Gastrointestinal: no abdominal pain, change in bowel habits, or black or bloody stools Genito-Urinary: no dysuria, trouble voiding, or hematuria Hematological and Lymphatic: negative for bleeding problems Musculoskeletal: positive for - joint pain, joint stiffness and pain in back - lower Remaining ROS negative.  Physical Exam: BP 136/79  Pulse 65  Temp(Src) 98.1 F (36.7 C) (Oral)  Resp 17  Ht '6\' 3"'  (1.905 m)  Wt 206 lb 9.6 oz (93.713 kg)  BMI 25.82 kg/m2  SpO2 100% ECOG: 1 General appearance: alert, cooperative, appears stated age and no distress; younger than stated age.  HEENT: PERRLa; EOMi; Sclerae clear.  Head: Normocephalic, without obvious abnormality, atraumatic Neck: no adenopathy, supple, symmetrical, trachea midline and thyroid not enlarged, symmetric, no tenderness/mass/nodules Lymph nodes: Cervical,  supraclavicular, and axillary nodes normal. Heart:regular rate and rhythm, S1, S2 normal, no murmur, click, rub or gallop Lung:chest clear, no wheezing, rales, normal symmetric air entry Abdomen: soft, non-tender, without masses or organomegaly EXT:No peripheral edema Neuro: Non focal. Gait, within normal limits.   Skin: cellulitis in his mid abdomen about 3 x 3 cm in diameter with TTP.  Lab Results: Lab Results  Component Value Date   WBC 3.7* 03/13/2014   HGB 11.9* 03/13/2014   HCT 35.6* 03/13/2014   MCV 110.9* 03/13/2014   PLT 88* 03/13/2014     Chemistry      Component Value Date/Time   NA 141 03/13/2014 1255   NA 141 06/23/2013 1627   K 3.8 03/13/2014 1255   K 3.5 06/23/2013 1627   CL 104 06/23/2013 1627   CL 110* 12/29/2012 1151   CO2 22 03/13/2014 1255   CO2 22 06/23/2013 1627   BUN 18.1 03/13/2014 1255   BUN 26* 06/23/2013  1627   CREATININE 1.1 03/13/2014 1255   CREATININE 1.22 06/23/2013 1627      Component Value Date/Time   CALCIUM 9.2 03/13/2014 1255   CALCIUM 9.0 06/23/2013 1627   ALKPHOS 41 03/13/2014 1255   ALKPHOS 57 06/23/2013 1627   AST 16 03/13/2014 1255   AST 18 06/23/2013 1627   ALT 17 03/13/2014 1255   ALT 28 06/23/2013 1627   BILITOT 0.65 03/13/2014 1255   BILITOT 0.4 06/23/2013 1627     Results for [name], [name] (MRN [id number]) as of 07/26/2013 08:09  IMAGING STUDIES:  1. Two-view chest x-ray on 06/28/2011 shows some mild bronchitic changes.  2. Metastatic bone survey on 07/04/2011 showed no lytic lesions in the visualized axial or appendicular skeleton. There was mild superior end-plate compression deformities at T12 and L3.  3. CT-guided left iliac bone marrow aspirate and core biopsy was carried out on 07/08/2011.  4. MRI of the lumbar spine without IV contrast on 09/30/2011 showed progressive compression fractures at L2, L3 and T12. There is diffuse scattered osseous metastatic disease. There was lumbar spondylosis and degenerative disk disease causing various degrees of impingement at all levels between L2 and S1. Progressive collapse of L3 was noted with 75% loss of vertebral body height, whereas on the prior study of 07/04/2011 there was 20% loss of height. There was 6 mm of bony retropulsion. There was progressive loss of L2 vertebral body noted with 50% loss of height whereas formerly it was at 20% loss. There is a 50-60% loss of vertebral body height noted at T12 minimally increased from the prior study with 2 mm of bony retropulsion. There was a 2.3 cm hemangioma present at the L1 vertebral body.  5. Chest x-ray, 2 view, from 01/06/2012 carried out at [facility] showed increased interstitial opacities, possibly representing edema or  infection.  6. MRI of the right hip carried out at [facility] on 04/07/2012 and compared with the prior MRI of the right hip on 11/19/2011 showed interval development  of a linear area of abnormal signal seen through the site of previous soft tissue mass within the right superior pubic rami. Findings were felt to represent a pathologic fracture most likely due to the underlying tumor involvement and/or an element of underlying radiation change. There was also re-demonstration of myelomatous metastatic lesions seen within the right hemipelvis and right proximal femur. Findings appear to be similar to the previous study. There was interval improvement of the previously visualized soft tissue mass involving the superior and inferior  pubic rami on the right. There continued to be mild cortical expansion and destruction within this region.  7. Port-A-Cath placement with ultrasound and fluoroscopic guidance was carried out on 07/29/2012 by Interventional Radiology. The report indicates that the catheter was placed on the right side through the right internal jugular vein. However, I believe, the Port-A-Cath is actually situated on the left side. 8. Chest x-ray, 2-view, from 10/11/2012 showed no active or acute cardiopulmonary disease.  There was a compression fracture of either T5 or T6 with a 40% anterior wedge compression, which may have progressed slightly since the previous study of 07/07/2011. 9. Bone Survey (01/24/2013). Osseous demineralization. Prior spinal augmentation procedures at T12, L2, and L3. Old fracture posterior right eighth rib. Degenerative disc facet disease changes cervical spine. Chronic anterior height loss T5. New mixed lytic and sclerotic process involving the right pubic body into the right superior pubic ramus, question healing fracture versus metastatic lesion; correlation with patient history and any recent prior outside imaging the patient may have. May also consider MR imaging of the pelvis for further evaluation. 10. MRI of the Pelvis (02/24/2013). Myeloma lesion of the right pubic body with adjacent pathologic fractures. Numerous small myeloma  lesions throughout the pelvic bones, proximal femurs, and distal lumbar spine.  Impression and Plan:  1. Kappa light chain multiple myeloma, ISS III.  -- Treatment. Continue Revlimid 15 mg daily, 3 weeks on, 1 week off, and Decadron 12 mg weekly inconjunction with aspirin 81 mg daily.  Decadron dose modified secondary to cushiongoid side effect and recent further decrease due to recurrent skin infections.  He is now on 6 mg decadron weekly.   His kappa chains appear very low and clinically he is doing well.  We will continue current dose and check q 4 weeks and hold his revlimid if his plt count is less than 30K.     -- Adjuntive therapy.  He was on zometa monthly.  Those treatments  Initially started on 05/11/2012 bimonthly and was held as noted above and resumed on 06/23/2013. He will receive zometa today.   --Complications including pathological fracture of right pelvis s/p XRT and secondary backpain, renal insufficiency, hypercalcemia (now resolved).  His pain is moderately controlled on fentanyl patch to 75 mcg/hr/3 days. A prescription for prn fentanyl patch was provided today.   2. History of prostate cancer dating back to September 2003, treated with robotic-assisted laparoscopic radical prostatectomy by Dr. Raynelle Bring.  The tumor was pathologic T3a N0 Mx.  Gleason score was 4+3=7 Tumor involved both lobes of the prostate with capsular extension and a positive margin focally at the left apex.  Pretreatment PSA was 4.87.  The patient did not receive postoperative radiation.  Recently, his PSA has been slowly rising.  He will follow up with urology for treatment options.   3. RTC in 2 months.  We will obtain CBC, CMP, serum light chain and immunofixation electrophoresis q 4 weeks  Spent more than half the time coordinating care.    Kiarra Kidd, MD 03/13/2014 9:20 PM

## 2014-03-13 NOTE — Telephone Encounter (Signed)
RECEIVED A FAX FROM [facility] CONCERNING A CONFIRMATION OF PRESCRIPTION SHIPMENT FOR REVLIMID ON 03/12/14.

## 2014-03-13 NOTE — Patient Instructions (Signed)

## 2014-03-13 NOTE — Telephone Encounter (Signed)
Pt confirmed labs/ov per 08/18 POF, gave pt AVS..Marland KitchenKJ

## 2014-03-13 NOTE — Telephone Encounter (Signed)
Per staff message and POF I have scheduled appts. Advised scheduler of appts. JMW  

## 2014-03-13 NOTE — Patient Instructions (Signed)

## 2014-03-14 LAB — KAPPA/LAMBDA LIGHT CHAINS
KAPPA FREE LGHT CHN: 1.54 mg/dL (ref 0.33–1.94)
KAPPA LAMBDA RATIO: 1.1 (ref 0.26–1.65)
LAMBDA FREE LGHT CHN: 1.4 mg/dL (ref 0.57–2.63)

## 2014-03-23 ENCOUNTER — Telehealth: Payer: Self-pay | Admitting: Medical Oncology

## 2014-03-23 ENCOUNTER — Encounter: Payer: Self-pay | Admitting: Internal Medicine

## 2014-03-23 NOTE — Telephone Encounter (Signed)
Pt called and states that he went to the dermatologist today to have the sore on his abdomen checked. It is MRSA. They have placed him on antibiotic for a month and a ointment he places in his nose and on the place. He just wanted to update Dr. Juliann Mule because he was worried it might be MRSA.Dr.Chism notified.

## 2014-03-26 ENCOUNTER — Telehealth: Payer: Self-pay | Admitting: *Deleted

## 2014-03-26 NOTE — Telephone Encounter (Signed)
Pt sent email message through [employer] with update re:  Pt had culture done on the pimple on stomach with results as  MRSA.  Pt has been treated with Doxycycline and Muciprocin Ointment to be put on the sore and in each nostril.    Spoke with pt and was informed that pt has been on these medications for 2 weeks now, and will continue for another 3 weeks.  Pt to have follow up appt with his dermatologist Dr. Allyson Sabal soon.  Asked pt to keep our office update with dermatologist appt. Gave pt appt date and time for 04/12/14 for lab, flush, and Zometa.  Pt voiced understanding.

## 2014-03-26 NOTE — Telephone Encounter (Signed)
Per POF staff phone call scheduled appts. Advised schedulers

## 2014-03-26 NOTE — Telephone Encounter (Signed)
Noted phone note dated 03-23-2014.  Patient has talked with collaborative nurse Shirlean Mylar

## 2014-04-04 ENCOUNTER — Other Ambulatory Visit: Payer: Self-pay | Admitting: *Deleted

## 2014-04-04 ENCOUNTER — Telehealth: Payer: Self-pay | Admitting: *Deleted

## 2014-04-04 DIAGNOSIS — C9000 Multiple myeloma not having achieved remission: Secondary | ICD-10-CM

## 2014-04-04 MED ORDER — LENALIDOMIDE 15 MG PO CAPS: 15.0000 mg | ORAL_CAPSULE | Freq: Every day | ORAL | Status: DC

## 2014-04-04 NOTE — Telephone Encounter (Signed)
[facility] faxed Revlimid refill request.  Request to provider's desk/in-basket for review.

## 2014-04-04 NOTE — Telephone Encounter (Signed)
Called patient with instructions to call [employer] to take patient survey.

## 2014-04-06 NOTE — Telephone Encounter (Signed)
RECEIVED A FAX FROM [facility] CONCERNING A CONFIRMATION OF PRESCRIPTION SHIPMENT FOR REVLIMID ON 04/05/14.

## 2014-04-10 ENCOUNTER — Ambulatory Visit: Payer: Medicare Other

## 2014-04-12 ENCOUNTER — Other Ambulatory Visit (HOSPITAL_BASED_OUTPATIENT_CLINIC_OR_DEPARTMENT_OTHER): Payer: Medicare Other

## 2014-04-12 ENCOUNTER — Ambulatory Visit (HOSPITAL_BASED_OUTPATIENT_CLINIC_OR_DEPARTMENT_OTHER): Payer: Medicare Other

## 2014-04-12 ENCOUNTER — Ambulatory Visit: Payer: Medicare Other

## 2014-04-12 ENCOUNTER — Other Ambulatory Visit: Payer: Medicare Other

## 2014-04-12 VITALS — BP 117/69 | HR 80 | Temp 98.1°F | Resp 18

## 2014-04-12 DIAGNOSIS — C9000 Multiple myeloma not having achieved remission: Secondary | ICD-10-CM

## 2014-04-12 DIAGNOSIS — Z23 Encounter for immunization: Secondary | ICD-10-CM

## 2014-04-12 DIAGNOSIS — Z95828 Presence of other vascular implants and grafts: Secondary | ICD-10-CM

## 2014-04-12 LAB — COMPREHENSIVE METABOLIC PANEL
ALBUMIN: 3.4 g/dL — AB (ref 3.5–5.2)
ALT: 19 U/L (ref 0–53)
AST: 19 U/L (ref 0–37)
Alkaline Phosphatase: 45 U/L (ref 39–117)
BILIRUBIN TOTAL: 0.7 mg/dL (ref 0.2–1.2)
BUN: 26 mg/dL — ABNORMAL HIGH (ref 6–23)
CO2: 23 mEq/L (ref 19–32)
Calcium: 9.3 mg/dL (ref 8.4–10.5)
Chloride: 105 mEq/L (ref 96–112)
Creatinine, Ser: 1.27 mg/dL (ref 0.50–1.35)
Glucose, Bld: 86 mg/dL (ref 70–99)
POTASSIUM: 4 meq/L (ref 3.5–5.3)
SODIUM: 141 meq/L (ref 135–145)
TOTAL PROTEIN: 6 g/dL (ref 6.0–8.3)

## 2014-04-12 LAB — CBC WITH DIFFERENTIAL/PLATELET
BASO%: 0.5 % (ref 0.0–2.0)
Basophils Absolute: 0 10*3/uL (ref 0.0–0.1)
EOS%: 0.3 % (ref 0.0–7.0)
Eosinophils Absolute: 0 10*3/uL (ref 0.0–0.5)
HEMATOCRIT: 34.6 % — AB (ref 38.4–49.9)
HGB: 12.1 g/dL — ABNORMAL LOW (ref 13.0–17.1)
LYMPH%: 16.4 % (ref 14.0–49.0)
MCH: 36.2 pg — ABNORMAL HIGH (ref 27.2–33.4)
MCHC: 35 g/dL (ref 32.0–36.0)
MCV: 103.6 fL — ABNORMAL HIGH (ref 79.3–98.0)
MONO#: 1.7 10*3/uL — AB (ref 0.1–0.9)
MONO%: 25.8 % — AB (ref 0.0–14.0)
NEUT#: 3.7 10*3/uL (ref 1.5–6.5)
NEUT%: 57 % (ref 39.0–75.0)
NRBC: 0 % (ref 0–0)
PLATELETS: 87 10*3/uL — AB (ref 140–400)
RBC: 3.34 10*6/uL — ABNORMAL LOW (ref 4.20–5.82)
RDW: 14.8 % — ABNORMAL HIGH (ref 11.0–14.6)
WBC: 6.4 10*3/uL (ref 4.0–10.3)
lymph#: 1.1 10*3/uL (ref 0.9–3.3)

## 2014-04-12 LAB — LACTATE DEHYDROGENASE (CC13): LDH: 154 U/L (ref 125–245)

## 2014-04-12 MED ORDER — SODIUM CHLORIDE 0.9 % IV SOLN
INTRAVENOUS | Status: DC
  Administered 2014-04-12: 12:00:00 via INTRAVENOUS

## 2014-04-12 MED ORDER — ZOLEDRONIC ACID 4 MG/100ML IV SOLN
4.0000 mg | Freq: Once | INTRAVENOUS | Status: AC
  Administered 2014-04-12: 4 mg via INTRAVENOUS
  Filled 2014-04-12: qty 100

## 2014-04-12 MED ORDER — HEPARIN SOD (PORK) LOCK FLUSH 100 UNIT/ML IV SOLN
500.0000 [IU] | Freq: Once | INTRAVENOUS | Status: AC
  Administered 2014-04-12: 500 [IU] via INTRAVENOUS
  Filled 2014-04-12: qty 5

## 2014-04-12 MED ORDER — SODIUM CHLORIDE 0.9 % IJ SOLN
10.0000 mL | INTRAMUSCULAR | Status: DC | PRN
  Administered 2014-04-12: 10 mL via INTRAVENOUS
  Filled 2014-04-12: qty 10

## 2014-04-12 MED ORDER — INFLUENZA VAC SPLIT QUAD 0.5 ML IM SUSY
0.5000 mL | PREFILLED_SYRINGE | Freq: Once | INTRAMUSCULAR | Status: AC
  Administered 2014-04-12: 0.5 mL via INTRAMUSCULAR
  Filled 2014-04-12: qty 0.5

## 2014-04-12 NOTE — Patient Instructions (Signed)

## 2014-04-12 NOTE — Patient Instructions (Signed)

## 2014-04-13 LAB — KAPPA/LAMBDA LIGHT CHAINS
Kappa free light chain: 1.07 mg/dL (ref 0.33–1.94)
Kappa:Lambda Ratio: 0.94 (ref 0.26–1.65)
Lambda Free Lght Chn: 1.14 mg/dL (ref 0.57–2.63)

## 2014-04-13 LAB — IGG, IGA, IGM
IgA: 58 mg/dL — ABNORMAL LOW (ref 68–379)
IgG (Immunoglobin G), Serum: 661 mg/dL (ref 650–1600)
IgM, Serum: 9 mg/dL — ABNORMAL LOW (ref 41–251)

## 2014-04-19 ENCOUNTER — Other Ambulatory Visit: Payer: Self-pay | Admitting: *Deleted

## 2014-04-19 DIAGNOSIS — C61 Malignant neoplasm of prostate: Secondary | ICD-10-CM

## 2014-04-19 DIAGNOSIS — N08 Glomerular disorders in diseases classified elsewhere: Secondary | ICD-10-CM

## 2014-04-19 DIAGNOSIS — S32000A Wedge compression fracture of unspecified lumbar vertebra, initial encounter for closed fracture: Secondary | ICD-10-CM

## 2014-04-19 DIAGNOSIS — R198 Other specified symptoms and signs involving the digestive system and abdomen: Secondary | ICD-10-CM

## 2014-04-19 DIAGNOSIS — C9000 Multiple myeloma not having achieved remission: Secondary | ICD-10-CM

## 2014-04-19 MED ORDER — FENTANYL 75 MCG/HR TD PT72: 75.0000 ug | MEDICATED_PATCH | TRANSDERMAL | Status: DC

## 2014-05-01 ENCOUNTER — Other Ambulatory Visit: Payer: Self-pay | Admitting: *Deleted

## 2014-05-01 DIAGNOSIS — C9000 Multiple myeloma not having achieved remission: Secondary | ICD-10-CM

## 2014-05-01 DIAGNOSIS — N08 Glomerular disorders in diseases classified elsewhere: Principal | ICD-10-CM

## 2014-05-01 MED ORDER — LENALIDOMIDE 15 MG PO CAPS: 15.0000 mg | ORAL_CAPSULE | Freq: Every day | ORAL | Status: DC

## 2014-05-01 NOTE — Addendum Note (Signed)
Addended by: Wyonia Hough on: 05/01/2014 04:32 PM   Modules accepted: Orders

## 2014-05-01 NOTE — Telephone Encounter (Signed)
THIS REFILL REQUEST FOR REVLIMID WAS GIVEN TO DR.SEHBAI'S NURSE, STACEY CAMP,RN.

## 2014-05-03 NOTE — Telephone Encounter (Signed)
RECEIVED A FAX FROM [facility]ERNING A CONFIRMATION OF FACSIMILE RECEIPT FOR PT. REFERRAL. 

## 2014-05-09 ENCOUNTER — Other Ambulatory Visit: Payer: Self-pay | Admitting: Hematology

## 2014-05-09 DIAGNOSIS — C9000 Multiple myeloma not having achieved remission: Secondary | ICD-10-CM

## 2014-05-09 NOTE — Telephone Encounter (Signed)
Faxed message from [employer] that Revlimid was shipped on 05/08/14 for next day delivery.

## 2014-05-10 ENCOUNTER — Ambulatory Visit: Payer: Medicare Other

## 2014-05-10 ENCOUNTER — Telehealth: Payer: Self-pay | Admitting: Hematology

## 2014-05-10 ENCOUNTER — Ambulatory Visit (HOSPITAL_BASED_OUTPATIENT_CLINIC_OR_DEPARTMENT_OTHER): Payer: Medicare Other | Admitting: Hematology

## 2014-05-10 ENCOUNTER — Other Ambulatory Visit (HOSPITAL_BASED_OUTPATIENT_CLINIC_OR_DEPARTMENT_OTHER): Payer: Medicare Other

## 2014-05-10 VITALS — BP 125/55 | HR 67 | Temp 97.7°F | Resp 18 | Ht 75.0 in | Wt 210.2 lb

## 2014-05-10 DIAGNOSIS — Z452 Encounter for adjustment and management of vascular access device: Secondary | ICD-10-CM

## 2014-05-10 DIAGNOSIS — Z8546 Personal history of malignant neoplasm of prostate: Secondary | ICD-10-CM

## 2014-05-10 DIAGNOSIS — Z87891 Personal history of nicotine dependence: Secondary | ICD-10-CM

## 2014-05-10 DIAGNOSIS — C9000 Multiple myeloma not having achieved remission: Secondary | ICD-10-CM

## 2014-05-10 DIAGNOSIS — Z95828 Presence of other vascular implants and grafts: Secondary | ICD-10-CM

## 2014-05-10 LAB — CBC WITH DIFFERENTIAL/PLATELET
BASO%: 1.2 % (ref 0.0–2.0)
Basophils Absolute: 0.1 10*3/uL (ref 0.0–0.1)
EOS%: 1 % (ref 0.0–7.0)
Eosinophils Absolute: 0 10*3/uL (ref 0.0–0.5)
HCT: 34.3 % — ABNORMAL LOW (ref 38.4–49.9)
HGB: 11.3 g/dL — ABNORMAL LOW (ref 13.0–17.1)
LYMPH#: 0.7 10*3/uL — AB (ref 0.9–3.3)
LYMPH%: 16.1 % (ref 14.0–49.0)
MCH: 36.8 pg — AB (ref 27.2–33.4)
MCHC: 33.1 g/dL (ref 32.0–36.0)
MCV: 111.3 fL — ABNORMAL HIGH (ref 79.3–98.0)
MONO#: 1.2 10*3/uL — ABNORMAL HIGH (ref 0.1–0.9)
MONO%: 29.3 % — ABNORMAL HIGH (ref 0.0–14.0)
NEUT#: 2.2 10*3/uL (ref 1.5–6.5)
NEUT%: 52.4 % (ref 39.0–75.0)
Platelets: 103 10*3/uL — ABNORMAL LOW (ref 140–400)
RBC: 3.08 10*6/uL — ABNORMAL LOW (ref 4.20–5.82)
RDW: 16 % — AB (ref 11.0–14.6)
WBC: 4.3 10*3/uL (ref 4.0–10.3)

## 2014-05-10 LAB — COMPREHENSIVE METABOLIC PANEL (CC13)
ALT: 31 U/L (ref 0–55)
ANION GAP: 7 meq/L (ref 3–11)
AST: 24 U/L (ref 5–34)
Albumin: 3.2 g/dL — ABNORMAL LOW (ref 3.5–5.0)
Alkaline Phosphatase: 61 U/L (ref 40–150)
BUN: 25.9 mg/dL (ref 7.0–26.0)
CHLORIDE: 111 meq/L — AB (ref 98–109)
CO2: 25 mEq/L (ref 22–29)
Calcium: 9.6 mg/dL (ref 8.4–10.4)
Creatinine: 1.2 mg/dL (ref 0.7–1.3)
GLUCOSE: 98 mg/dL (ref 70–140)
Potassium: 3.5 mEq/L (ref 3.5–5.1)
SODIUM: 142 meq/L (ref 136–145)
TOTAL PROTEIN: 5.7 g/dL — AB (ref 6.4–8.3)
Total Bilirubin: 0.52 mg/dL (ref 0.20–1.20)

## 2014-05-10 MED ORDER — SODIUM CHLORIDE 0.9 % IJ SOLN
10.0000 mL | INTRAMUSCULAR | Status: DC | PRN
  Administered 2014-05-10: 10 mL via INTRAVENOUS
  Filled 2014-05-10: qty 10

## 2014-05-10 MED ORDER — HEPARIN SOD (PORK) LOCK FLUSH 100 UNIT/ML IV SOLN
500.0000 [IU] | Freq: Once | INTRAVENOUS | Status: AC
  Administered 2014-05-10: 500 [IU] via INTRAVENOUS
  Filled 2014-05-10: qty 5

## 2014-05-10 MED ORDER — ALTEPLASE 2 MG IJ SOLR
2.0000 mg | Freq: Once | INTRAMUSCULAR | Status: AC
Start: 2014-05-10 — End: 2014-05-10
  Administered 2014-05-10: 2 mg
  Filled 2014-05-10: qty 2

## 2014-05-10 NOTE — Patient Instructions (Signed)

## 2014-05-10 NOTE — Progress Notes (Signed)
Hematology and Oncology Follow Up Visit Date of Clinic Visit: 05/10/2014  [name] [id number] October 26, 1945 68 y.o. Jeremiah Lofts, MD  CHIEF COMPLAINT:  Multiple Myeloma for follow up  Principle Diagnosis: Kappa light chain multiple myeloma, ISS III with diagnosis established in December 2012.  Prior Therapy:  1. Velcade, Cytoxan, and Decadron on a weekly basis from 07/10/2011 through 10/23/2011; 2. High-dose chemotherapy on 11/23/2011, consisting of carmustine 600 mg IV. He received cytarabine 400 mg IV on 12/24/2011, 12/25/2011, 12/26/2011, and 12/27/2011. He received etoposide 300 mg IV daily from 12/24/2011 through 12/27/2011, four doses. He received melphalan 280 mg IV on 12/28/2011.  3. 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  Current therapy: -Revlimid 15 mg daily, 3 weeks on, 1 week off. Revlimid was started on 05/11/2012.  Decadron dose was 20 mg weekly as of 08/10/2012.  Because of cushingoid effects, the Decadron dose was decreased to 12 mg weekly starting on Wednesday, June 11th. Decadron was started at 40 mg weekly on 05/11/2012. Adjunctive therapy with Zometa 4 mg IV is being given every 2 months.  Zometa was started on 09/02/2012.  Dental clearance was obtained.  He is on fentanyl 75 and oxycodone prn. His current Decadron dose if 6 mg every week. He started Revlimid on 05/09/2014 3 weeks on 1 week off. He does complain of frequent bruising which is a function of thrombocytopenia, aspirin and Decadron. Vitamin C 500 mg can potentially help with bruising. She did get a flu shot last month and plan to get a pneumonia vaccine at the [facility] in few weeks. He also had a skin breakdown and a staph infection for which she was fine from local antibiotic cream and finished 30 days of doxycycline. He also saw his ophthalmologist yesterday and recently had cataracts operated upon.  Interim History:  [name] was seen today for followup of his kappa light chain  multiple myeloma diagnosed in December 2012.   He was last seen by Dr Juliann Mule on 03/13/2014. He continues his Revlimid 3 weeks out of every 4.  He was taking decadron 12 mg because of cushingoid effects which has been tapered down to 6 mg weekly.  He is also seeing a pain specialist at [facility] and there has been some discussion about restarting his lyrica next month  In addition, he was on zometa 4 mg every month (was placed on hold due to current osteomyelitis but restarted on 06/23/2013).    He also reports that his PSA is slowly rising and this may require future therapy by his urologist, Dr. Alinda Money. He remains active and reports backpain that he rates 2 out of 10 relieved with his fentanyl patch (increased to 75 mcg q 72 hours) and oxycodones prn.   He follows with Dr. Samule Ohm annually. I am skipping Zometa today and will be administering it every other month.  Problem list:  1. Kappa light chain multiple myeloma, ISS III with diagnosis established in December 2012 when the patient presented with marked anemia, thrombocytopenia and extensive bone marrow replacement with immature plasma cells/plasma blasts and circulating plasma cells in the peripheral blood. Bone marrow was carried out on 07/08/2011. In addition, the patient had severe renal insufficiency and hypercalcemia. He was requiring transfusions of platelets and red cells. He appeared to have limited bone disease characterized by mild compression fractures of T12 and L3. A bone density scan done a couple months preceding the diagnosis had been normal. Results of cytogenetic analysis revealed the presence of 2  clonal cell lines. The 1st cell line was chromosomally normal (20% of cells). 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 There was a gain of chromosome 7 and a marker chromosome. Chromosome 2 appeared to be in a rearrangement involving both its short and long  arms. FISH studies showed loss of chromosome 4, loss of chromosome 14, loss of chromosome 13, and loss of chromosome 17, i.e. 4 P-, 14 Q-, 13 Q- and 17 P-. These findings would be consistent with abnormalities associated with multiple myeloma. The patient received Velcade, Cytoxan, and Decadron on a weekly basis from 07/10/2011 through 10/23/2011. The patient developed pain in the region of his right hip during the month of April. An MRI of the right hip at [facility] on 11/19/2011 showed a destructive lesion of the right pubic body and adjacent rami with associated soft tissue component. There was marked heterogeneous abnormal marrow signal throughout the visualized marrow. The patient also underwent an MRI of the lumbar spine with and without IV contrast on 11/19/2011. Multiple compression deformities were noted, most severely at L3, where there was slight retropulsion resulting in moderate canal stenosis. An MRI of the right knee on 12/01/2011 showed no evidence of multiple myeloma; however, there was a horizontal medial meniscus tear involving the body and posterior horn and degenerative changes. These studies were carried out at [facility]. The patient underwent radiation treatments to the right hemipelvis. He received apparently 10 treatments at [facility] with some improvement in pain. Mr. Mierzejewski then underwent high-dose chemotherapy on 11/23/2011, consisting of carmustine 600 mg IV. He received cytarabine 400 mg IV on 12/24/2011, 12/25/2011, 12/26/2011, and 12/27/2011. He received etoposide 300 mg IV daily from 12/24/2011 through 12/27/2011, four doses. He received melphalan 280 mg IV on 12/28/2011. He was scheduled to receive autologous stem cell reinfusion on 12/29/2011. The patient's initial hospitalization was from 12/23/2011 through 12/28/2011. He required readmission at [facility] from 01/06/2012 through 01/10/2012. He had developed fever, shaking chills, diarrhea, weakness, and hypotension. One out of 2 blood cultures grew  out gram-positive cocci, felt to be staphylococcus from his Hickman catheter. He was treated with ceftazidime and vancomycin. Vancomycin was discontinued after a 10-day course on 01/15/2012. The patient was found to have progressive disease in late September 2013 as evidenced by an increase in his serum kappa light chains and serum protein M spike. It was recommended that the patient start Revlimid 15 mg daily, 3 weeks on, 1 week off, and Decadron 40 mg weekly, in conjunction with aspirin 81 mg daily. Those treatments started on 05/11/2012.  2. Pancytopenia at diagnosis secondary to extensive marrow replacement by multiple myeloma.  3. Renal insufficiency, markedly improved since diagnosis.  4. Hypercalcemia at presentation, now resolved following treatment.  5. Anemia which developed in mid January 2012 secondary to treatment responsive to Aranesp.  6. History of prostate cancer dating back to September 2003, treated with robotic-assisted laparoscopic radical prostatectomy by Dr. Raynelle Bring.  The tumor was pathologic T3a N0 Mx.  Gleason score was 4+3=7 Tumor involved both lobes of the prostate with capsular extension and a positive margin focally at the left apex.  Pretreatment PSA was 4.87.  The patient did not receive postoperative radiation.  Recently, his PSA has been slowly rising.  PSA on 10/28/2012 was 0.32.  7. Hypertension.  8. Dyslipidemia.  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  10. Right-sided Port-A-Cath placement  on 08/05/2011. This was subsequently removed when the patient was at [facility] in June 2013.  11. Kyphoplasty carried out by Dr. Luanne Bras on 10/13/2011 involving vertebral bodies T12, L2 and L3. Review of bone biopsy pathology from kyphoplasties done on 10/13/2011 showed scattered plasma cells with no kappa or lambda light chain restriction.  12. Destructive lesion of the right pubic body and adjacent rami with associated  soft tissue component detected on MRI of the right hip on 11/19/2011 at [facility]. The patient received 10 radiation treatments at [facility] with improvement in pain.  13. Weight loss of approximately 35 pounds from May through June 2013. The patient had regained all his weight by December 2013.  14. Left-sided Port-A-Cath was placed by Interventional Radiology on 07/29/2012.   Medications: I have reviewed the patient's current medications.  Current Outpatient Prescriptions  Medication Sig Dispense Refill  . acyclovir (ZOVIRAX) 400 MG tablet Take 1 tablet (400 mg total) by mouth 2 (two) times daily.  14 tablet  0  . allopurinol (ZYLOPRIM) 100 MG tablet Take 1 tablet (100 mg total) by mouth daily.  90 tablet  3  . aspirin 81 MG tablet Take 81 mg by mouth daily.      Marland Kitchen dexamethasone (DECADRON) 4 MG tablet TAKE 3 TABLETS BY MOUTH EVERY WEEK  42 tablet  0  . fentaNYL (DURAGESIC - DOSED MCG/HR) 75 MCG/HR Place 1 patch (75 mcg total) onto the skin every 3 (three) days.  10 patch  0  . lenalidomide (REVLIMID) 15 MG capsule Take 1 capsule (15 mg total) by mouth daily. Take for 21 days then rest for 7 days  21 capsule  0  . lidocaine-prilocaine (EMLA) cream Apply topically as needed. Apply to port site one hour before treatment and cover with plastic wrap  30 g  2  . NONFORMULARY OR COMPOUNDED ITEM Baclofn/amitrp/ketamn cream topically to feet TID prn for neuropathy      . oxyCODONE (OXY IR/ROXICODONE) 5 MG immediate release tablet Take 1 tablet (5 mg total) by mouth every 4 (four) hours as needed for severe pain.  60 tablet  0  . pantoprazole (PROTONIX) 40 MG tablet Take 1 tablet (40 mg total) by mouth daily.  90 tablet  3  . prochlorperazine (COMPAZINE) 10 MG tablet Take 10 mg by mouth every 6 (six) hours as needed.       Current Facility-Administered Medications  Medication Dose Route Frequency Provider Last Rate Last Dose  . sodium chloride 0.9 % injection 10 mL  10 mL Intravenous PRN Alicianna Litchford Marla Roe, MD    10 mL at 05/10/14 1605   Allergies:  Allergies  Allergen Reactions  . Celebrex [Celecoxib] Nausea Only  . Cymbalta [Duloxetine Hcl] Other (See Comments)    Weight loss  . Meloxicam Itching  . Percocet [Oxycodone-Acetaminophen] Itching  . Vicodin [Hydrocodone-Acetaminophen] Itching  . Lyrica [Pregabalin] Rash    Lesions    Past Medical History, Surgical history, Social history, and Family History were reviewed and updated.  IMMUNIZATIONS:  Pneumovax was given on 04/28/2011 at the [facility]. He had a flu shot on 04/13/2012.   SMOKING HISTORY: The patient had smoked 2 packs of cigarettes a day for approximately 25 years but stopped smoking in 1998.  Review of Systems: Constitutional:  Negative for fever, chills, night sweats, anorexia, weight loss, pain. Cardiovascular: no chest pain or dyspnea on exertion Respiratory: no cough, shortness of breath, or wheezing Neurological: no TIA or stroke symptoms Dermatological: positive for erythrema  around distal 2nd Left toe ENT: negative for - epistaxis Skin: Negative. Gastrointestinal: no abdominal pain, change in bowel habits, or black or bloody stools Genito-Urinary: no dysuria, trouble voiding, or hematuria Hematological and Lymphatic: negative for bleeding problems Musculoskeletal: positive for - joint pain, joint stiffness and pain in back - lower Remaining ROS negative.  Physical Exam: BP 125/55  Pulse 67  Temp(Src) 97.7 F (36.5 C) (Oral)  Resp 18  Ht '6\' 3"'  (1.905 m)  Wt 210 lb 3.2 oz (95.346 kg)  BMI 26.27 kg/m2  SpO2 99% ECOG: 1 General appearance: alert, cooperative, appears stated age and no distress; younger than stated age.  HEENT: PERRLa; EOMi; Sclerae clear.  Head: Normocephalic, without obvious abnormality, atraumatic Neck: no adenopathy, supple, symmetrical, trachea midline and thyroid not enlarged, symmetric, no tenderness/mass/nodules Lymph nodes: Cervical, supraclavicular, and axillary nodes  normal. Heart:regular rate and rhythm, S1, S2 normal, no murmur, click, rub or gallop Lung:chest clear, no wheezing, rales, normal symmetric air entry Abdomen: soft, non-tender, without masses or organomegaly EXT:No peripheral edema Neuro: Non focal. Gait, within normal limits.   Skin: cellulitis in his mid abdomen about 3 x 3 cm in diameter with TTP.  Lab Results: Labs from today are pending except CBC as port was accessed after I saw him in clinic and we will be watching for results.   Lab Results  Component Value Date   WBC 4.3 05/10/2014   HGB 11.3* 05/10/2014   HCT 34.3* 05/10/2014   MCV 111.3* 05/10/2014   PLT 103* 05/10/2014     Chemistry      Component Value Date/Time   NA 141 04/12/2014 1041   NA 141 03/13/2014 1255   K 4.0 04/12/2014 1041   K 3.8 03/13/2014 1255   CL 105 04/12/2014 1041   CL 110* 12/29/2012 1151   CO2 23 04/12/2014 1041   CO2 22 03/13/2014 1255   BUN 26* 04/12/2014 1041   BUN 18.1 03/13/2014 1255   CREATININE 1.27 04/12/2014 1041   CREATININE 1.1 03/13/2014 1255      Component Value Date/Time   CALCIUM 9.3 04/12/2014 1041   CALCIUM 9.2 03/13/2014 1255   ALKPHOS 45 04/12/2014 1041   ALKPHOS 41 03/13/2014 1255   AST 19 04/12/2014 1041   AST 16 03/13/2014 1255   ALT 19 04/12/2014 1041   ALT 17 03/13/2014 1255   BILITOT 0.7 04/12/2014 1041   BILITOT 0.65 03/13/2014 1255     Results for TEVITA, GOMER (MRN [id number]) as of 07/26/2013 08:09  IMAGING STUDIES:  1. Two-view chest x-ray on 06/28/2011 shows some mild bronchitic changes.  2. Metastatic bone survey on 07/04/2011 showed no lytic lesions in the visualized axial or appendicular skeleton. There was mild superior end-plate compression deformities at T12 and L3.  3. CT-guided left iliac bone marrow aspirate and core biopsy was carried out on 07/08/2011.  4. MRI of the lumbar spine without IV contrast on 09/30/2011 showed progressive compression fractures at L2, L3 and T12. There is diffuse scattered osseous  metastatic disease. There was lumbar spondylosis and degenerative disk disease causing various degrees of impingement at all levels between L2 and S1. Progressive collapse of L3 was noted with 75% loss of vertebral body height, whereas on the prior study of 07/04/2011 there was 20% loss of height. There was 6 mm of bony retropulsion. There was progressive loss of L2 vertebral body noted with 50% loss of height whereas formerly it was at 20% loss. There is a 50-60% loss of  vertebral body height noted at T12 minimally increased from the prior study with 2 mm of bony retropulsion. There was a 2.3 cm hemangioma present at the L1 vertebral body.  5. Chest x-ray, 2 view, from 01/06/2012 carried out at [facility] showed increased interstitial opacities, possibly representing edema or  infection.  6. MRI of the right hip carried out at [facility] on 04/07/2012 and compared with the prior MRI of the right hip on 11/19/2011 showed interval development of a linear area of abnormal signal seen through the site of previous soft tissue mass within the right superior pubic rami. Findings were felt to represent a pathologic fracture most likely due to the underlying tumor involvement and/or an element of underlying radiation change. There was also re-demonstration of myelomatous metastatic lesions seen within the right hemipelvis and right proximal femur. Findings appear to be similar to the previous study. There was interval improvement of the previously visualized soft tissue mass involving the superior and inferior pubic rami on the right. There continued to be mild cortical expansion and destruction within this region.  7. Port-A-Cath placement with ultrasound and fluoroscopic guidance was carried out on 07/29/2012 by Interventional Radiology. The report indicates that the catheter was placed on the right side through the right internal jugular vein. However, I believe, the Port-A-Cath is actually situated on the left side. 8. Chest  x-ray, 2-view, from 10/11/2012 showed no active or acute cardiopulmonary disease.  There was a compression fracture of either T5 or T6 with a 40% anterior wedge compression, which may have progressed slightly since the previous study of 07/07/2011. 9. Bone Survey (01/24/2013). Osseous demineralization. Prior spinal augmentation procedures at T12, L2, and L3. Old fracture posterior right eighth rib. Degenerative disc facet disease changes cervical spine. Chronic anterior height loss T5. New mixed lytic and sclerotic process involving the right pubic body into the right superior pubic ramus, question healing fracture versus metastatic lesion; correlation with patient history and any recent prior outside imaging the patient may have. May also consider MR imaging of the pelvis for further evaluation. 10. MRI of the Pelvis (02/24/2013). Myeloma lesion of the right pubic body with adjacent pathologic fractures. Numerous small myeloma lesions throughout the pelvic bones, proximal femurs, and distal lumbar spine.  Impression and Plan:  1. Kappa light chain multiple myeloma, ISS III.  -- Treatment. Continue Revlimid 15 mg daily, 3 weeks on, 1 week off, and Decadron 6 mg weekly in conjunction with aspirin 81 mg daily.  Decadron dose modified secondary to cushiongoid side effect and recent further decrease due to recurrent skin infections.  He is now on 6 mg decadron weekly.   His kappa chains appear very low last month and clinically he is doing well.  We will continue current dose and check q 4 weeks and hold his revlimid if his plt count is less than 30K.      -- Adjuntive therapy.  He was on zometa monthly.  Those treatments  Initially started on 05/11/2012 bimonthly and was held as noted above and resumed on 06/23/2013. He will receive zometa next month and thereafter every other month.   --Complications including pathological fracture of right pelvis s/p XRT and secondary backpain, renal insufficiency,  hypercalcemia (now resolved).  His pain is moderately controlled on fentanyl patch to 75 mcg/hr/3 days.   2. History of prostate cancer dating back to September 2003, treated with robotic-assisted laparoscopic radical prostatectomy by Dr. Raynelle Bring.  The tumor was pathologic T3a N0 Mx.  Gleason score  was 4+3=7 Tumor involved both lobes of the prostate with capsular extension and a positive margin focally at the left apex.  Pretreatment PSA was 4.87.  The patient did not receive postoperative radiation.  Recently, his PSA has been slowly rising.  He followed up with urology yesterday and his PSA is 1.37 very slow rise and they will see him again in 6 months.    3. RTC in 1 months.  We will obtain CBC, CMP, serum light chain and immunofixation electrophoresis in 4 weeks and plan zometa than.  Spent more than half the time coordinating care.    Glean Salvo, MD 03/13/2014 9:20 PM

## 2014-05-10 NOTE — Telephone Encounter (Signed)
gv adn printed appt sched and avs fo rpt for NOV.....sed added tx.

## 2014-05-11 ENCOUNTER — Other Ambulatory Visit: Payer: Self-pay

## 2014-05-11 LAB — LACTATE DEHYDROGENASE (CC13): LDH: 210 U/L (ref 125–245)

## 2014-05-14 LAB — SPEP & IFE WITH QIG
ALPHA-1-GLOBULIN: 5.3 % — AB (ref 2.9–4.9)
Albumin ELP: 58.6 % (ref 55.8–66.1)
Alpha-2-Globulin: 11.3 % (ref 7.1–11.8)
BETA 2: 7.4 % — AB (ref 3.2–6.5)
Beta Globulin: 7.9 % — ABNORMAL HIGH (ref 4.7–7.2)
Gamma Globulin: 9.5 % — ABNORMAL LOW (ref 11.1–18.8)
IgA: 51 mg/dL — ABNORMAL LOW (ref 68–379)
IgG (Immunoglobin G), Serum: 538 mg/dL — ABNORMAL LOW (ref 650–1600)
IgM, Serum: 9 mg/dL — ABNORMAL LOW (ref 41–251)
TOTAL PROTEIN, SERUM ELECTROPHOR: 5.8 g/dL — AB (ref 6.0–8.3)

## 2014-05-14 LAB — KAPPA/LAMBDA LIGHT CHAINS
KAPPA LAMBDA RATIO: 1.35 (ref 0.26–1.65)
Kappa free light chain: 1.3 mg/dL (ref 0.33–1.94)
Lambda Free Lght Chn: 0.96 mg/dL (ref 0.57–2.63)

## 2014-05-23 ENCOUNTER — Other Ambulatory Visit: Payer: Self-pay | Admitting: Nurse Practitioner

## 2014-05-28 ENCOUNTER — Other Ambulatory Visit: Payer: Self-pay | Admitting: *Deleted

## 2014-05-28 DIAGNOSIS — C61 Malignant neoplasm of prostate: Secondary | ICD-10-CM

## 2014-05-28 DIAGNOSIS — S32000A Wedge compression fracture of unspecified lumbar vertebra, initial encounter for closed fracture: Secondary | ICD-10-CM

## 2014-05-28 DIAGNOSIS — N08 Glomerular disorders in diseases classified elsewhere: Secondary | ICD-10-CM

## 2014-05-28 DIAGNOSIS — C9000 Multiple myeloma not having achieved remission: Secondary | ICD-10-CM

## 2014-05-28 MED ORDER — FENTANYL 75 MCG/HR TD PT72: 75.0000 ug | MEDICATED_PATCH | TRANSDERMAL | Status: DC

## 2014-05-29 ENCOUNTER — Other Ambulatory Visit: Payer: Self-pay | Admitting: *Deleted

## 2014-05-29 NOTE — Telephone Encounter (Signed)
THIS REFILL REQUEST FOR REVLIMID WAS GIVEN TO DR.SEHBAI'S NURSE, STACEY CAMP,RN.

## 2014-05-30 ENCOUNTER — Other Ambulatory Visit: Payer: Self-pay | Admitting: *Deleted

## 2014-05-30 DIAGNOSIS — N08 Glomerular disorders in diseases classified elsewhere: Principal | ICD-10-CM

## 2014-05-30 DIAGNOSIS — C9000 Multiple myeloma not having achieved remission: Secondary | ICD-10-CM

## 2014-05-30 MED ORDER — LENALIDOMIDE 15 MG PO CAPS: 15.0000 mg | ORAL_CAPSULE | Freq: Every day | ORAL | Status: DC

## 2014-05-30 NOTE — Telephone Encounter (Signed)
Spoke with pt today and instructed pt to call [employer] to take pt survey for Revlimid refill.  Pt voiced understanding.

## 2014-06-05 ENCOUNTER — Ambulatory Visit: Payer: Medicare Other

## 2014-06-05 NOTE — Telephone Encounter (Signed)
RECEIVED A FAX FROM [facility] CONCERNING A CONFIRMATION OF PRESCRIPTION SHIPMENT FOR REVLIMID ON 06/04/14.

## 2014-06-07 ENCOUNTER — Other Ambulatory Visit (HOSPITAL_BASED_OUTPATIENT_CLINIC_OR_DEPARTMENT_OTHER): Payer: Medicare Other

## 2014-06-07 ENCOUNTER — Telehealth: Payer: Self-pay | Admitting: Hematology

## 2014-06-07 ENCOUNTER — Ambulatory Visit (HOSPITAL_BASED_OUTPATIENT_CLINIC_OR_DEPARTMENT_OTHER): Payer: Medicare Other

## 2014-06-07 ENCOUNTER — Ambulatory Visit (HOSPITAL_BASED_OUTPATIENT_CLINIC_OR_DEPARTMENT_OTHER): Payer: Medicare Other | Admitting: Hematology

## 2014-06-07 ENCOUNTER — Telehealth: Payer: Self-pay | Admitting: *Deleted

## 2014-06-07 ENCOUNTER — Ambulatory Visit: Payer: Medicare Other

## 2014-06-07 VITALS — BP 132/65 | HR 64 | Temp 98.0°F | Resp 18 | Ht 75.0 in | Wt 208.4 lb

## 2014-06-07 DIAGNOSIS — C9000 Multiple myeloma not having achieved remission: Secondary | ICD-10-CM

## 2014-06-07 DIAGNOSIS — D631 Anemia in chronic kidney disease: Secondary | ICD-10-CM

## 2014-06-07 DIAGNOSIS — Z8546 Personal history of malignant neoplasm of prostate: Secondary | ICD-10-CM

## 2014-06-07 DIAGNOSIS — N189 Chronic kidney disease, unspecified: Principal | ICD-10-CM

## 2014-06-07 DIAGNOSIS — Z95828 Presence of other vascular implants and grafts: Secondary | ICD-10-CM

## 2014-06-07 LAB — CBC WITH DIFFERENTIAL/PLATELET
BASO%: 0.6 % (ref 0.0–2.0)
BASOS ABS: 0 10*3/uL (ref 0.0–0.1)
EOS%: 0.1 % (ref 0.0–7.0)
Eosinophils Absolute: 0 10*3/uL (ref 0.0–0.5)
HCT: 34.1 % — ABNORMAL LOW (ref 38.4–49.9)
HEMOGLOBIN: 11.4 g/dL — AB (ref 13.0–17.1)
LYMPH#: 0.8 10*3/uL — AB (ref 0.9–3.3)
LYMPH%: 13.7 % — ABNORMAL LOW (ref 14.0–49.0)
MCH: 37.2 pg — ABNORMAL HIGH (ref 27.2–33.4)
MCHC: 33.5 g/dL (ref 32.0–36.0)
MCV: 111 fL — ABNORMAL HIGH (ref 79.3–98.0)
MONO#: 1.5 10*3/uL — ABNORMAL HIGH (ref 0.1–0.9)
MONO%: 26.8 % — ABNORMAL HIGH (ref 0.0–14.0)
NEUT#: 3.3 10*3/uL (ref 1.5–6.5)
NEUT%: 58.8 % (ref 39.0–75.0)
Platelets: 120 10*3/uL — ABNORMAL LOW (ref 140–400)
RBC: 3.07 10*6/uL — ABNORMAL LOW (ref 4.20–5.82)
RDW: 15.3 % — AB (ref 11.0–14.6)
WBC: 5.6 10*3/uL (ref 4.0–10.3)

## 2014-06-07 LAB — COMPREHENSIVE METABOLIC PANEL (CC13)
ALBUMIN: 3.3 g/dL — AB (ref 3.5–5.0)
ALT: 20 U/L (ref 0–55)
AST: 17 U/L (ref 5–34)
Alkaline Phosphatase: 52 U/L (ref 40–150)
Anion Gap: 7 mEq/L (ref 3–11)
BUN: 27.6 mg/dL — AB (ref 7.0–26.0)
CALCIUM: 9.3 mg/dL (ref 8.4–10.4)
CHLORIDE: 107 meq/L (ref 98–109)
CO2: 24 mEq/L (ref 22–29)
Creatinine: 1.2 mg/dL (ref 0.7–1.3)
Glucose: 113 mg/dl (ref 70–140)
POTASSIUM: 3.8 meq/L (ref 3.5–5.1)
Sodium: 137 mEq/L (ref 136–145)
Total Bilirubin: 0.4 mg/dL (ref 0.20–1.20)
Total Protein: 5.8 g/dL — ABNORMAL LOW (ref 6.4–8.3)

## 2014-06-07 MED ORDER — HEPARIN SOD (PORK) LOCK FLUSH 100 UNIT/ML IV SOLN
500.0000 [IU] | Freq: Once | INTRAVENOUS | Status: AC
  Administered 2014-06-07: 500 [IU] via INTRAVENOUS
  Filled 2014-06-07: qty 5

## 2014-06-07 MED ORDER — ZOLEDRONIC ACID 4 MG/100ML IV SOLN
4.0000 mg | Freq: Once | INTRAVENOUS | Status: AC
  Administered 2014-06-07: 4 mg via INTRAVENOUS
  Filled 2014-06-07: qty 100

## 2014-06-07 MED ORDER — SODIUM CHLORIDE 0.9 % IJ SOLN
10.0000 mL | Freq: Once | INTRAMUSCULAR | Status: AC
  Administered 2014-06-07: 10 mL via INTRAVENOUS
  Filled 2014-06-07: qty 10

## 2014-06-07 MED ORDER — SODIUM CHLORIDE 0.9 % IJ SOLN
10.0000 mL | INTRAMUSCULAR | Status: DC | PRN
  Administered 2014-06-07: 10 mL via INTRAVENOUS
  Filled 2014-06-07: qty 10

## 2014-06-07 MED ORDER — SODIUM CHLORIDE 0.9 % IV SOLN
INTRAVENOUS | Status: DC
  Administered 2014-06-07: 12:00:00 via INTRAVENOUS

## 2014-06-07 NOTE — Patient Instructions (Signed)

## 2014-06-07 NOTE — Telephone Encounter (Signed)
Per staff message and POF I have scheduled appts. Advised scheduler of appts. JMW  

## 2014-06-07 NOTE — Patient Instructions (Signed)

## 2014-06-07 NOTE — Progress Notes (Signed)
Hematology and Oncology Follow Up Visit Date of Clinic Visit: 06/07/2014  [name] [id number] 08-20-1945 68 y.o. [name], MD  CHIEF COMPLAINT:  Multiple Myeloma for follow up  Principle Diagnosis: Kappa light chain multiple myeloma, ISS III with diagnosis established in December 2012.  Prior Therapy:  1. Velcade, Cytoxan, and Decadron on a weekly basis from 07/10/2011 through 10/23/2011; 2. High-dose chemotherapy on 11/23/2011, consisting of carmustine 600 mg IV. He received cytarabine 400 mg IV on 12/24/2011, 12/25/2011, 12/26/2011, and 12/27/2011. He received etoposide 300 mg IV daily from 12/24/2011 through 12/27/2011, four doses. He received melphalan 280 mg IV on 12/28/2011.  3. 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  Current therapy: -Revlimid 15 mg daily, 3 weeks on, 1 week off. Revlimid was started on 05/11/2012.  Decadron dose was 20 mg weekly as of 08/10/2012.  Because of cushingoid effects, the Decadron dose was decreased to 12 mg weekly starting on Wednesday, June 11th. Decadron was started at 40 mg weekly on 05/11/2012. Adjunctive therapy with Zometa 4 mg IV is being given every 2 months.  Zometa was started on 09/02/2012.  Dental clearance was obtained.  He is on fentanyl 75 and oxycodone prn. His current Decadron dose if 6 mg every week. He started Revlimid on 05/09/2014 3 weeks on 1 week off. He does complain of frequent bruising which is a function of thrombocytopenia, aspirin and Decadron. Vitamin C 500 mg can potentially help with bruising. He did get a flu shot last month and plan to get a pneumonia vaccine at the [facility] in few weeks. He also had a skin breakdown and a staph infection for which she was fine from local antibiotic cream and finished 30 days of doxycycline. He also saw his ophthalmologist yesterday and recently had cataracts operated upon.  Interim History:  [name] was seen today for followup of his kappa light chain multiple  myeloma diagnosed in December 2012.   He was last seen by me on 05/17/2014. He continues his Revlimid 3 weeks out of every 4.  He was taking decadron 12 mg because of cushingoid effects which has been tapered down to 6 mg weekly.  He is also seeing a pain specialist at [facility].  In addition, he was on zometa 4 mg every month (was placed on hold due to current osteomyelitis but restarted on 06/23/2013).    He also reports that his PSA is slowly rising and this may require future therapy by his urologist, Dr. Alinda Money. He remains active and reports backpain that he rates 2 out of 10 relieved with his fentanyl patch (increased to 75 mcg q 72 hours) and oxycodones prn.   He follows with Dr. Samule Ohm annually.  Patient started his Revlimid yesterday on 06/06/2014. He is tolerating and well. He is already been vaccinated for both flu and pneumonia this year. Patient will be seeing Dr Alvy Bimler with next clinic visit.  Problem list:  1. Kappa light chain multiple myeloma, ISS III with diagnosis established in December 2012 when the patient presented with marked anemia, thrombocytopenia and extensive bone marrow replacement with immature plasma cells/plasma blasts and circulating plasma cells in the peripheral blood. Bone marrow was carried out on 07/08/2011. In addition, the patient had severe renal insufficiency and hypercalcemia. He was requiring transfusions of platelets and red cells. He appeared to have limited bone disease characterized by mild compression fractures of T12 and L3. A bone density scan done a couple months preceding the diagnosis had been normal.  Results of cytogenetic analysis revealed the presence of 2 clonal cell lines. The 1st cell line was chromosomally normal (20% of cells). 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 There was a gain of chromosome 7 and a marker chromosome. Chromosome 2 appeared to be in a  rearrangement involving both its short and long arms. FISH studies showed loss of chromosome 4, loss of chromosome 14, loss of chromosome 13, and loss of chromosome 17, i.e. 4 P-, 14 Q-, 13 Q- and 17 P-. These findings would be consistent with abnormalities associated with multiple myeloma. The patient received Velcade, Cytoxan, and Decadron on a weekly basis from 07/10/2011 through 10/23/2011. The patient developed pain in the region of his right hip during the month of April. An MRI of the right hip at [facility] on 11/19/2011 showed a destructive lesion of the right pubic body and adjacent rami with associated soft tissue component. There was marked heterogeneous abnormal marrow signal throughout the visualized marrow. The patient also underwent an MRI of the lumbar spine with and without IV contrast on 11/19/2011. Multiple compression deformities were noted, most severely at L3, where there was slight retropulsion resulting in moderate canal stenosis. An MRI of the right knee on 12/01/2011 showed no evidence of multiple myeloma; however, there was a horizontal medial meniscus tear involving the body and posterior horn and degenerative changes. These studies were carried out at [facility]. The patient underwent radiation treatments to the right hemipelvis. He received apparently 10 treatments at [facility] with some improvement in pain. Mr. Franzel then underwent high-dose chemotherapy on 11/23/2011, consisting of carmustine 600 mg IV. He received cytarabine 400 mg IV on 12/24/2011, 12/25/2011, 12/26/2011, and 12/27/2011. He received etoposide 300 mg IV daily from 12/24/2011 through 12/27/2011, four doses. He received melphalan 280 mg IV on 12/28/2011. He was scheduled to receive autologous stem cell reinfusion on 12/29/2011. The patient's initial hospitalization was from 12/23/2011 through 12/28/2011. He required readmission at [facility] from 01/06/2012 through 01/10/2012. He had developed fever, shaking chills, diarrhea, weakness,  and hypotension. One out of 2 blood cultures grew out gram-positive cocci, felt to be staphylococcus from his Hickman catheter. He was treated with ceftazidime and vancomycin. Vancomycin was discontinued after a 10-day course on 01/15/2012. The patient was found to have progressive disease in late September 2013 as evidenced by an increase in his serum kappa light chains and serum protein M spike. It was recommended that the patient start Revlimid 15 mg daily, 3 weeks on, 1 week off, and Decadron 40 mg weekly, in conjunction with aspirin 81 mg daily. Those treatments started on 05/11/2012.  2. Pancytopenia at diagnosis secondary to extensive marrow replacement by multiple myeloma.  3. Renal insufficiency, markedly improved since diagnosis.  4. Hypercalcemia at presentation, now resolved following treatment.  5. Anemia which developed in mid January 2012 secondary to treatment responsive to Aranesp.  6. History of prostate cancer dating back to September 2003, treated with robotic-assisted laparoscopic radical prostatectomy by Dr. Raynelle Bring.  The tumor was pathologic T3a N0 Mx.  Gleason score was 4+3=7 Tumor involved both lobes of the prostate with capsular extension and a positive margin focally at the left apex.  Pretreatment PSA was 4.87.  The patient did not receive postoperative radiation.  Recently, his PSA has been slowly rising.  PSA on 10/28/2012 was 0.32.  7. Hypertension.  8. Dyslipidemia.  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  10. Right-sided Port-A-Cath placement on 08/05/2011. This was subsequently removed when the patient was at [facility] in June 2013.  11. Kyphoplasty carried out by Dr. Luanne Bras on 10/13/2011 involving vertebral bodies T12, L2 and L3. Review of bone biopsy pathology from kyphoplasties done on 10/13/2011 showed scattered plasma cells with no kappa or lambda light chain restriction.  12. Destructive lesion of the  right pubic body and adjacent rami with associated soft tissue component detected on MRI of the right hip on 11/19/2011 at [facility]. The patient received 10 radiation treatments at [facility] with improvement in pain.  13. Weight loss of approximately 35 pounds from May through June 2013. The patient had regained all his weight by December 2013.  14. Left-sided Port-A-Cath was placed by Interventional Radiology on 07/29/2012.   Medications: I have reviewed the patient's current medications.  Current Outpatient Prescriptions  Medication Sig Dispense Refill  . acyclovir (ZOVIRAX) 400 MG tablet Take 1 tablet (400 mg total) by mouth 2 (two) times daily. 14 tablet 0  . allopurinol (ZYLOPRIM) 100 MG tablet Take 1 tablet (100 mg total) by mouth daily. 90 tablet 3  . aspirin 81 MG tablet Take 81 mg by mouth daily.    Marland Kitchen dexamethasone (DECADRON) 4 MG tablet TAKE 3 TABLETS BY MOUTH EVERY WEEK (Patient taking differently: TAKE 1 and 1/2  TABLETS BY MOUTH EVERY WEEK) 42 tablet 0  . fentaNYL (DURAGESIC - DOSED MCG/HR) 75 MCG/HR Place 1 patch (75 mcg total) onto the skin every 3 (three) days. 10 patch 0  . lenalidomide (REVLIMID) 15 MG capsule Take 1 capsule (15 mg total) by mouth daily. Take for 21 days then rest for 7 days 21 capsule 0  . lidocaine-prilocaine (EMLA) cream Apply topically as needed. Apply to port site one hour before treatment and cover with plastic wrap 30 g 2  . NONFORMULARY OR COMPOUNDED ITEM Baclofn/amitrp/ketamn cream topically to feet TID prn for neuropathy    . oxyCODONE (OXY IR/ROXICODONE) 5 MG immediate release tablet Take 1 tablet (5 mg total) by mouth every 4 (four) hours as needed for severe pain. 60 tablet 0  . pantoprazole (PROTONIX) 40 MG tablet Take 1 tablet (40 mg total) by mouth daily. 90 tablet 3  . prochlorperazine (COMPAZINE) 10 MG tablet Take 10 mg by mouth every 6 (six) hours as needed.    . vitamin C (ASCORBIC ACID) 500 MG tablet Take 1 tablet by mouth daily.     No current  facility-administered medications for this visit.   Facility-Administered Medications Ordered in Other Visits  Medication Dose Route Frequency Provider Last Rate Last Dose  . 0.9 %  sodium chloride infusion   Intravenous Continuous Ladell Pier, MD   Stopped at 06/07/14 1254   Allergies:  Allergies  Allergen Reactions  . Celebrex [Celecoxib] Nausea Only  . Cymbalta [Duloxetine Hcl] Other (See Comments)    Weight loss  . Meloxicam Itching  . Percocet [Oxycodone-Acetaminophen] Itching  . Vicodin [Hydrocodone-Acetaminophen] Itching  . Lyrica [Pregabalin] Rash    Lesions    Past Medical History, Surgical history, Social history, and Family History were reviewed and updated.  IMMUNIZATIONS:  Pneumovax was given on 04/28/2011 at the [facility]. He had a flu shot on 04/13/2012.   SMOKING HISTORY: The patient had smoked 2 packs of cigarettes a day for approximately 25 years but stopped smoking in 1998.  Review of Systems: Constitutional:  Negative for fever, chills, night sweats, anorexia, weight loss, pain.  Cardiovascular: no chest pain or dyspnea on exertion Respiratory: no cough, shortness of breath, or wheezing Neurological: no TIA or stroke symptoms Dermatological: positive for erythrema around distal 2nd Left toe ENT: negative for - epistaxis Skin: Negative. Gastrointestinal: no abdominal pain, change in bowel habits, or black or bloody stools Genito-Urinary: no dysuria, trouble voiding, or hematuria Hematological and Lymphatic: negative for bleeding problems Musculoskeletal: positive for - joint pain, joint stiffness and pain in back - lower Remaining ROS negative.  Physical Exam: BP 132/65 mmHg  Pulse 64  Temp(Src) 98 F (36.7 C) (Oral)  Resp 18  Ht '6\' 3"'  (1.905 m)  Wt 208 lb 6.4 oz (94.53 kg)  BMI 26.05 kg/m2  SpO2 100% ECOG: 1 General appearance: alert, cooperative, appears stated age and no distress; younger than stated age.  HEENT: PERRLa; EOMi;  Sclerae clear.  Head: Normocephalic, without obvious abnormality, atraumatic Neck: no adenopathy, supple, symmetrical, trachea midline and thyroid not enlarged, symmetric, no tenderness/mass/nodules Lymph nodes: Cervical, supraclavicular, and axillary nodes normal. Heart:regular rate and rhythm, S1, S2 normal, no murmur, click, rub or gallop Lung:chest clear, no wheezing, rales, normal symmetric air entry Abdomen: soft, non-tender, without masses or organomegaly EXT:No peripheral edema Neuro: Non focal. Gait, within normal limits.   Skin: cellulitis in his mid abdomen about 3 x 3 cm in diameter with TTP.  Lab Results: Labs from today are pending except CBC as port was accessed after I saw him in clinic and we will be watching for results.   Lab Results  Component Value Date   WBC 5.6 06/07/2014   HGB 11.4* 06/07/2014   HCT 34.1* 06/07/2014   MCV 111.0* 06/07/2014   PLT 120* 06/07/2014     Chemistry      Component Value Date/Time   NA 137 06/07/2014 1035   NA 141 04/12/2014 1041   K 3.8 06/07/2014 1035   K 4.0 04/12/2014 1041   CL 105 04/12/2014 1041   CL 110* 12/29/2012 1151   CO2 24 06/07/2014 1035   CO2 23 04/12/2014 1041   BUN 27.6* 06/07/2014 1035   BUN 26* 04/12/2014 1041   CREATININE 1.2 06/07/2014 1035   CREATININE 1.27 04/12/2014 1041      Component Value Date/Time   CALCIUM 9.3 06/07/2014 1035   CALCIUM 9.3 04/12/2014 1041   ALKPHOS 52 06/07/2014 1035   ALKPHOS 45 04/12/2014 1041   AST 17 06/07/2014 1035   AST 19 04/12/2014 1041   ALT 20 06/07/2014 1035   ALT 19 04/12/2014 1041   BILITOT 0.40 06/07/2014 1035   BILITOT 0.7 04/12/2014 1041           Myeloma markers pending from today.  IMAGING STUDIES:  1. Two-view chest x-ray on 06/28/2011 shows some mild bronchitic changes.  2. Metastatic bone survey on 07/04/2011 showed no lytic lesions in the visualized axial or appendicular skeleton. There was mild superior end-plate compression deformities at  T12 and L3.  3. CT-guided left iliac bone marrow aspirate and core biopsy was carried out on 07/08/2011.  4. MRI of the lumbar spine without IV contrast on 09/30/2011 showed progressive compression fractures at L2, L3 and T12. There is diffuse scattered osseous metastatic disease. There was lumbar spondylosis and degenerative disk disease causing various degrees of impingement at all levels between L2 and S1. Progressive collapse of L3 was noted with 75% loss of vertebral body height, whereas on the prior study of 07/04/2011 there was 20% loss of height. There was 6 mm of bony retropulsion.  There was progressive loss of L2 vertebral body noted with 50% loss of height whereas formerly it was at 20% loss. There is a 50-60% loss of vertebral body height noted at T12 minimally increased from the prior study with 2 mm of bony retropulsion. There was a 2.3 cm hemangioma present at the L1 vertebral body.  5. Chest x-ray, 2 view, from 01/06/2012 carried out at [facility] showed increased interstitial opacities, possibly representing edema or  infection.  6. MRI of the right hip carried out at [facility] on 04/07/2012 and compared with the prior MRI of the right hip on 11/19/2011 showed interval development of a linear area of abnormal signal seen through the site of previous soft tissue mass within the right superior pubic rami. Findings were felt to represent a pathologic fracture most likely due to the underlying tumor involvement and/or an element of underlying radiation change. There was also re-demonstration of myelomatous metastatic lesions seen within the right hemipelvis and right proximal femur. Findings appear to be similar to the previous study. There was interval improvement of the previously visualized soft tissue mass involving the superior and inferior pubic rami on the right. There continued to be mild cortical expansion and destruction within this region.  7. Port-A-Cath placement with ultrasound and fluoroscopic  guidance was carried out on 07/29/2012 by Interventional Radiology. The report indicates that the catheter was placed on the right side through the right internal jugular vein. However, I believe, the Port-A-Cath is actually situated on the left side. 8. Chest x-ray, 2-view, from 10/11/2012 showed no active or acute cardiopulmonary disease.  There was a compression fracture of either T5 or T6 with a 40% anterior wedge compression, which may have progressed slightly since the previous study of 07/07/2011. 9. Bone Survey (01/24/2013). Osseous demineralization. Prior spinal augmentation procedures at T12, L2, and L3. Old fracture posterior right eighth rib. Degenerative disc facet disease changes cervical spine. Chronic anterior height loss T5. New mixed lytic and sclerotic process involving the right pubic body into the right superior pubic ramus, question healing fracture versus metastatic lesion; correlation with patient history and any recent prior outside imaging the patient may have. May also consider MR imaging of the pelvis for further evaluation. 10. MRI of the Pelvis (02/24/2013). Myeloma lesion of the right pubic body with adjacent pathologic fractures. Numerous small myeloma lesions throughout the pelvic bones, proximal femurs, and distal lumbar spine.  Impression and Plan:  1. Kappa light chain multiple myeloma, ISS III.  -- Treatment. Continue Revlimid 15 mg daily, 3 weeks on, 1 week off, and Decadron 6 mg weekly in conjunction with aspirin 81 mg daily.  Decadron dose modified secondary to cushiongoid side effect and recent further decrease due to recurrent skin infections.  He is now on 6 mg decadron weekly.   His kappa/lambda ratio was normal, No M spike and IgG levels low and clinically he is doing well.  We will continue current dose and check q 4 weeks and hold his revlimid if his plt count is less than 30K.      -- Adjuntive therapy.  He was on zometa monthly.  Those treatments  Initially  started on 05/11/2012 bimonthly and was held as noted above and resumed on 06/23/2013. He will receive zometa next month and thereafter every other month.   --Complications including pathological fracture of right pelvis s/p XRT and secondary backpain, renal insufficiency, hypercalcemia (now resolved).  His pain is moderately controlled on fentanyl patch to 75 mcg/hr/3 days.  2. History of prostate cancer dating back to September 2003, treated with robotic-assisted laparoscopic radical prostatectomy by Dr. Raynelle Bring.  The tumor was pathologic T3a N0 Mx.  Gleason score was 4+3=7 Tumor involved both lobes of the prostate with capsular extension and a positive margin focally at the left apex.  Pretreatment PSA was 4.87.  The patient did not receive postoperative radiation.  Recently, his PSA has been slowly rising.  He followed up with urology yesterday and his PSA is 1.37 very slow rise and they will see him again in 6 months.    3. RTC in 1 months.  We will obtain CBC, CMP, serum light chain and immunofixation electrophoresis in 4 weeks. He got zometa today so next dose in 2 months.   Bernadene Bell, MD Medical Hematologist/Oncologist [place] Pager: [phone] Office No: [phone]

## 2014-06-07 NOTE — Telephone Encounter (Signed)
Gave avs & cal for Dec. °

## 2014-06-07 NOTE — Progress Notes (Signed)
Discharged at 1300, ambulatory in no distress.

## 2014-06-11 LAB — KAPPA/LAMBDA LIGHT CHAINS
KAPPA LAMBDA RATIO: 1.07 (ref 0.26–1.65)
Kappa free light chain: 1.27 mg/dL (ref 0.33–1.94)
Lambda Free Lght Chn: 1.19 mg/dL (ref 0.57–2.63)

## 2014-06-11 LAB — SPEP & IFE WITH QIG
ALPHA-1-GLOBULIN: 5.5 % — AB (ref 2.9–4.9)
ALPHA-2-GLOBULIN: 12.8 % — AB (ref 7.1–11.8)
Albumin ELP: 59.5 % (ref 55.8–66.1)
BETA GLOBULIN: 5.4 % (ref 4.7–7.2)
Beta 2: 5.2 % (ref 3.2–6.5)
Gamma Globulin: 11.6 % (ref 11.1–18.8)
IgA: 77 mg/dL (ref 68–379)
IgG (Immunoglobin G), Serum: 602 mg/dL — ABNORMAL LOW (ref 650–1600)
IgM, Serum: 10 mg/dL — ABNORMAL LOW (ref 41–251)
Total Protein, Serum Electrophoresis: 5.6 g/dL — ABNORMAL LOW (ref 6.0–8.3)

## 2014-06-15 ENCOUNTER — Other Ambulatory Visit: Payer: Self-pay

## 2014-06-28 ENCOUNTER — Other Ambulatory Visit: Payer: Self-pay | Admitting: *Deleted

## 2014-06-28 DIAGNOSIS — C9000 Multiple myeloma not having achieved remission: Secondary | ICD-10-CM

## 2014-06-28 DIAGNOSIS — N08 Glomerular disorders in diseases classified elsewhere: Principal | ICD-10-CM

## 2014-06-28 MED ORDER — LENALIDOMIDE 15 MG PO CAPS: 15.0000 mg | ORAL_CAPSULE | Freq: Every day | ORAL | Status: DC

## 2014-06-28 NOTE — Telephone Encounter (Signed)
RECEIVED A FAX FROM [facility]ERNING A CONFIRMATION OF FACSIMILE RECEIPT FOR PT. REFERRAL. 

## 2014-07-02 ENCOUNTER — Telehealth: Payer: Self-pay | Admitting: *Deleted

## 2014-07-02 ENCOUNTER — Other Ambulatory Visit: Payer: Self-pay | Admitting: Hematology and Oncology

## 2014-07-02 DIAGNOSIS — S32000A Wedge compression fracture of unspecified lumbar vertebra, initial encounter for closed fracture: Secondary | ICD-10-CM

## 2014-07-02 DIAGNOSIS — C9000 Multiple myeloma not having achieved remission: Secondary | ICD-10-CM

## 2014-07-02 DIAGNOSIS — N08 Glomerular disorders in diseases classified elsewhere: Secondary | ICD-10-CM

## 2014-07-02 DIAGNOSIS — C61 Malignant neoplasm of prostate: Secondary | ICD-10-CM

## 2014-07-02 MED ORDER — FENTANYL 75 MCG/HR TD PT72: 75.0000 ug | MEDICATED_PATCH | TRANSDERMAL | Status: DC

## 2014-07-02 NOTE — Telephone Encounter (Signed)
Informed pt of Rx for Fentanyl Patches ready to pick up.  He verbalized understanding.

## 2014-07-02 NOTE — Telephone Encounter (Signed)
Pt requests refill on Fentanyl Patches 75 mcg/hr.

## 2014-07-03 ENCOUNTER — Telehealth: Payer: Self-pay | Admitting: Hematology and Oncology

## 2014-07-03 NOTE — Telephone Encounter (Signed)
RECEIVED A FAX FROM [employer] CONCERNING A CONFIRMATION OF PRESCRIPTION SHIPMENT FOR REVLIMID ON 07/02/14.

## 2014-07-03 NOTE — Telephone Encounter (Signed)
pt called re reminder received for 12/10 appt (inf - zometa). per pt appt moved to 12/18 after f/u w/NG. also zometa for 08/02/14 cxd due to appts not coord (time span). pt to be given addtional appts at 12/18 f/u. confirmed 12/18 appts for 11am.

## 2014-07-05 ENCOUNTER — Ambulatory Visit: Payer: Medicare Other

## 2014-07-13 ENCOUNTER — Telehealth: Payer: Self-pay | Admitting: *Deleted

## 2014-07-13 ENCOUNTER — Telehealth: Payer: Self-pay | Admitting: Hematology and Oncology

## 2014-07-13 ENCOUNTER — Other Ambulatory Visit (HOSPITAL_BASED_OUTPATIENT_CLINIC_OR_DEPARTMENT_OTHER): Payer: Medicare Other

## 2014-07-13 ENCOUNTER — Ambulatory Visit (HOSPITAL_BASED_OUTPATIENT_CLINIC_OR_DEPARTMENT_OTHER): Payer: Medicare Other

## 2014-07-13 ENCOUNTER — Ambulatory Visit (HOSPITAL_BASED_OUTPATIENT_CLINIC_OR_DEPARTMENT_OTHER): Payer: Medicare Other | Admitting: Hematology and Oncology

## 2014-07-13 ENCOUNTER — Encounter: Payer: Self-pay | Admitting: Hematology and Oncology

## 2014-07-13 ENCOUNTER — Ambulatory Visit: Payer: Medicare Other

## 2014-07-13 VITALS — BP 126/58 | HR 66 | Temp 97.4°F | Resp 18 | Ht 75.0 in | Wt 209.5 lb

## 2014-07-13 DIAGNOSIS — C9000 Multiple myeloma not having achieved remission: Secondary | ICD-10-CM

## 2014-07-13 DIAGNOSIS — T451X5A Adverse effect of antineoplastic and immunosuppressive drugs, initial encounter: Secondary | ICD-10-CM

## 2014-07-13 DIAGNOSIS — D638 Anemia in other chronic diseases classified elsewhere: Secondary | ICD-10-CM

## 2014-07-13 DIAGNOSIS — N08 Glomerular disorders in diseases classified elsewhere: Principal | ICD-10-CM

## 2014-07-13 DIAGNOSIS — Z95828 Presence of other vascular implants and grafts: Secondary | ICD-10-CM

## 2014-07-13 DIAGNOSIS — Z8546 Personal history of malignant neoplasm of prostate: Secondary | ICD-10-CM

## 2014-07-13 DIAGNOSIS — S32000A Wedge compression fracture of unspecified lumbar vertebra, initial encounter for closed fracture: Secondary | ICD-10-CM

## 2014-07-13 DIAGNOSIS — D6959 Other secondary thrombocytopenia: Secondary | ICD-10-CM

## 2014-07-13 DIAGNOSIS — T50905A Adverse effect of unspecified drugs, medicaments and biological substances, initial encounter: Secondary | ICD-10-CM

## 2014-07-13 DIAGNOSIS — G620 Drug-induced polyneuropathy: Secondary | ICD-10-CM

## 2014-07-13 DIAGNOSIS — C61 Malignant neoplasm of prostate: Secondary | ICD-10-CM

## 2014-07-13 LAB — COMPREHENSIVE METABOLIC PANEL (CC13)
ALBUMIN: 3.1 g/dL — AB (ref 3.5–5.0)
ALK PHOS: 49 U/L (ref 40–150)
ALT: 24 U/L (ref 0–55)
ANION GAP: 9 meq/L (ref 3–11)
AST: 18 U/L (ref 5–34)
BILIRUBIN TOTAL: 0.68 mg/dL (ref 0.20–1.20)
BUN: 21.9 mg/dL (ref 7.0–26.0)
CO2: 24 meq/L (ref 22–29)
Calcium: 8.5 mg/dL (ref 8.4–10.4)
Chloride: 108 mEq/L (ref 98–109)
Creatinine: 1.2 mg/dL (ref 0.7–1.3)
EGFR: 59 mL/min/{1.73_m2} — AB (ref >90)
GLUCOSE: 99 mg/dL (ref 70–140)
Potassium: 3.5 mEq/L (ref 3.5–5.1)
SODIUM: 142 meq/L (ref 136–145)
TOTAL PROTEIN: 5.4 g/dL — AB (ref 6.4–8.3)

## 2014-07-13 LAB — CBC WITH DIFFERENTIAL/PLATELET
BASO%: 1.3 % (ref 0.0–2.0)
Basophils Absolute: 0.1 10*3/uL (ref 0.0–0.1)
EOS ABS: 1 10*3/uL — AB (ref 0.0–0.5)
EOS%: 18.3 % — ABNORMAL HIGH (ref 0.0–7.0)
HEMATOCRIT: 35.9 % — AB (ref 38.4–49.9)
HGB: 11.7 g/dL — ABNORMAL LOW (ref 13.0–17.1)
LYMPH%: 9.3 % — AB (ref 14.0–49.0)
MCH: 36.2 pg — ABNORMAL HIGH (ref 27.2–33.4)
MCHC: 32.6 g/dL (ref 32.0–36.0)
MCV: 111.1 fL — AB (ref 79.3–98.0)
MONO#: 0.8 10*3/uL (ref 0.1–0.9)
MONO%: 14.8 % — ABNORMAL HIGH (ref 0.0–14.0)
NEUT%: 56.3 % (ref 39.0–75.0)
NEUTROS ABS: 3.1 10*3/uL (ref 1.5–6.5)
Platelets: 87 10*3/uL — ABNORMAL LOW (ref 140–400)
RBC: 3.23 10*6/uL — ABNORMAL LOW (ref 4.20–5.82)
RDW: 15.3 % — ABNORMAL HIGH (ref 11.0–14.6)
WBC: 5.5 10*3/uL (ref 4.0–10.3)
lymph#: 0.5 10*3/uL — ABNORMAL LOW (ref 0.9–3.3)

## 2014-07-13 MED ORDER — ZOLEDRONIC ACID 4 MG/100ML IV SOLN
4.0000 mg | Freq: Once | INTRAVENOUS | Status: AC
  Administered 2014-07-13: 4 mg via INTRAVENOUS
  Filled 2014-07-13: qty 100

## 2014-07-13 MED ORDER — LENALIDOMIDE 10 MG PO CAPS: 10.0000 mg | ORAL_CAPSULE | Freq: Every day | ORAL | Status: DC

## 2014-07-13 MED ORDER — FENTANYL 75 MCG/HR TD PT72: 75.0000 ug | MEDICATED_PATCH | TRANSDERMAL | Status: DC

## 2014-07-13 MED ORDER — SODIUM CHLORIDE 0.9 % IJ SOLN
10.0000 mL | INTRAMUSCULAR | Status: DC | PRN
  Administered 2014-07-13: 10 mL via INTRAVENOUS
  Filled 2014-07-13: qty 10

## 2014-07-13 MED ORDER — HEPARIN SOD (PORK) LOCK FLUSH 100 UNIT/ML IV SOLN
500.0000 [IU] | Freq: Once | INTRAVENOUS | Status: AC | PRN
  Administered 2014-07-13: 500 [IU]
  Filled 2014-07-13: qty 5

## 2014-07-13 MED ORDER — SODIUM CHLORIDE 0.9 % IJ SOLN
10.0000 mL | INTRAMUSCULAR | Status: DC | PRN
  Administered 2014-07-13: 10 mL
  Filled 2014-07-13: qty 10

## 2014-07-13 MED ORDER — LIDOCAINE-PRILOCAINE 2.5-2.5 % EX CREA: TOPICAL_CREAM | CUTANEOUS | Status: DC | PRN

## 2014-07-13 MED ORDER — SODIUM CHLORIDE 0.9 % IV SOLN
Freq: Once | INTRAVENOUS | Status: AC
  Administered 2014-07-13: 13:00:00 via INTRAVENOUS

## 2014-07-13 MED ORDER — OXYCODONE HCL 5 MG PO TABS: 5.0000 mg | ORAL_TABLET | ORAL | Status: DC | PRN

## 2014-07-13 NOTE — Assessment & Plan Note (Signed)
He had kyphoplasty. He remained on Zometa. I refilled his prescription fentanyl patch and oxycodone for back pain

## 2014-07-13 NOTE — Progress Notes (Signed)
[facility] progress notes  Patient Care Team: Jinny Sanders, MD as PCP - General  CHIEF COMPLAINTS/PURPOSE OF VISIT:  Multiple myeloma  HISTORY OF PRESENTING ILLNESS:  [name] 68 y.o. male was transferred to my care after his prior physician has left.  I reviewed the patient's records extensive and collaborated the history with the patient. Summary of his history is as follows:  Principle Diagnosis: Kappa light chain multiple myeloma, ISS III with diagnosis established in December 2012.  Prior Therapy:  1. Velcade, Cytoxan, and Decadron on a weekly basis from 07/10/2011 through 10/23/2011; 2. High-dose chemotherapy on 11/23/2011, consisting of carmustine 600 mg IV. He received cytarabine 400 mg IV on 12/24/2011, 12/25/2011, 12/26/2011, and 12/27/2011. He received etoposide 300 mg IV daily from 12/24/2011 through 12/27/2011, four doses. He received melphalan 280 mg IV on 12/28/2011.  3. 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  Current therapy: -Revlimid 15 mg daily, 3 weeks on, 1 week off. Revlimid was started on 05/11/2012.  Zometa was started on 09/02/2012.  Dental clearance was obtained.  Zometa 4 mg was placed on hold due to current osteomyelitis but restarted on 06/23/2013.    He also reports that his PSA is slowly rising and this may require future therapy by his urologist, Dr. Alinda Money. He remains active and reports backpain that he rates 2 out of 10 relieved with his fentanyl patch (increased to 75 mcg q 72 hours) and oxycodones prn.   He follows with Dr. Samule Ohm annually. He is tolerating treatment well. He is already been vaccinated for both flu and pneumonia this year.  He complained of mild peripheral neuropathy. MEDICAL HISTORY:  Past Medical History  Diagnosis Date  . HTN (hypertension)   . Allergic rhinitis   . Hyperlipemia   . Hypercalcemia of malignancy   . Multiple myeloma 07/09/2011  . Myeloma kidney   . Peripheral  neuropathy     toes, sees Dr Krista Blue  . Multiple myeloma   . Fractures     compression T12,L3  . Anemia 07/2010    secondary to tx rsponsive to [facility]  . Prostate cancer 2008    s/p prostatectomy  . Prostate cancer 03/2002  . Renal insufficiency   . History of chemotherapy     weekly Velcade,Cytoxan  . 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 DDD (degenerative disc disease)     SURGICAL HISTORY: Past Surgical History  Procedure Laterality Date  . Kidney stone surgery    . Kidney stone surgery  06/2009    Stone removed from bladder  . Dexa  05/2011    spine 0.7, L femur 0.0, R femur -0.6, normal  . Prostatectomy  04/06/07  . Portacath placement  08/05/11    right sided portacath    SOCIAL HISTORY: History   Social History  . Marital Status: Single    Spouse Name: N/A    Number of Children: N/A  . Years of Education: N/A   Occupational History  . 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 Smoking status: Former Smoker -- 2.00 packs/day for 30 years    Quit date: 07/27/1996  . Smokeless tobacco: Never Used  . Alcohol Use: No  . Drug Use: No  . Sexual Activity: No   Other Topics Concern  . Not on file   Social History Narrative    FAMILY HISTORY: Family History  Problem Relation Age of Onset  . Pneumonia Mother   . Stroke Father   . Cancer Brother     lung  . Kidney cancer Sister     ALLERGIES:  is allergic to celebrex; cymbalta; meloxicam; percocet; vicodin; and lyrica.  MEDICATIONS:  Current Outpatient Prescriptions  Medication Sig Dispense Refill  . acyclovir (ZOVIRAX) 400 MG tablet Take 1 tablet (400 mg total) by mouth 2 (two) times daily. 14 tablet 0  . allopurinol (ZYLOPRIM) 100 MG tablet Take 1 tablet (100 mg total) by mouth daily. 90 tablet 3  . aspirin 81 MG tablet Take 81 mg by mouth daily.    Marland Kitchen b complex vitamins tablet Take 1 tablet by mouth daily.    . calcium carbonate 200  MG capsule Take 750 mg by mouth daily.    . cholecalciferol (VITAMIN D) 1000 UNITS tablet Take 1,000 Units by mouth daily.    Marland Kitchen dexamethasone (DECADRON) 4 MG tablet TAKE 3 TABLETS BY MOUTH EVERY WEEK (Patient taking differently: TAKE 1 and 1/2  TABLETS BY MOUTH EVERY WEEK) 42 tablet 0  . fentaNYL (DURAGESIC - DOSED MCG/HR) 75 MCG/HR Place 1 patch (75 mcg total) onto the skin every 3 (three) days. 10 patch 0  . lenalidomide (REVLIMID) 10 MG capsule Take 1 capsule (10 mg total) by mouth daily. Take for 21 days then rest for 7 days 21 capsule 0  . lidocaine-prilocaine (EMLA) cream Apply topically as needed. Apply to port site one hour before treatment and cover with plastic wrap 30 g 2  . NONFORMULARY OR COMPOUNDED ITEM Baclofn/amitrp/ketamn cream topically to feet TID prn for neuropathy    . oxyCODONE (OXY IR/ROXICODONE) 5 MG immediate release tablet Take 1 tablet (5 mg total) by mouth every 4 (four) hours as needed for severe pain. 60 tablet 0  . pantoprazole (PROTONIX) 40 MG tablet Take 1 tablet (40 mg total) by mouth daily. 90 tablet 3  . vitamin C (ASCORBIC ACID) 500 MG tablet Take 1 tablet by mouth daily.    . prochlorperazine (COMPAZINE) 10 MG tablet Take 10 mg by mouth every 6 (six) hours as needed.     No current facility-administered medications for this visit.   Facility-Administered Medications Ordered in Other Visits  Medication Dose Route Frequency Provider Last Rate Last Dose  . sodium chloride 0.9 % injection 10 mL  10 mL Intracatheter PRN Heath Lark, MD   10 mL at 07/13/14 1327    REVIEW OF SYSTEMS:   Constitutional: Denies fevers, chills or abnormal night sweats Eyes: Denies blurriness of vision, double vision or watery eyes Ears, nose, mouth, throat, and face: Denies mucositis or sore throat Respiratory: Denies cough, dyspnea or wheezes Cardiovascular: Denies palpitation, chest discomfort or lower extremity swelling Gastrointestinal:  Denies nausea, heartburn or change in  bowel habits Skin: Denies abnormal skin rashes Lymphatics: Denies new lymphadenopathy or easy bruising Neurological:Denies numbness, tingling or new weaknesses Behavioral/Psych: Mood is stable, no new changes  All other systems were reviewed with the patient and are negative.  PHYSICAL EXAMINATION: ECOG PERFORMANCE STATUS: 0 - Asymptomatic  Filed Vitals:   07/13/14 1130  BP: 126/58  Pulse: 66  Temp: 97.4 F (36.3 C)  Resp: 18   Filed Weights   07/13/14 1130  Weight: 209 lb 8 oz (95.029 kg)    GENERAL:alert, no distress and comfortable. He is obese SKIN: skin color, texture, turgor are normal, no rashes or significant lesions EYES: normal, conjunctiva are pink and non-injected, sclera clear OROPHARYNX:no exudate,  normal lips, buccal mucosa, and tongue  NECK: supple, thyroid normal size, non-tender, without nodularity LYMPH:  no palpable lymphadenopathy in the cervical, axillary or inguinal LUNGS: clear to auscultation and percussion with normal breathing effort HEART: regular rate & rhythm and no murmurs without lower extremity edema ABDOMEN:abdomen soft, non-tender and normal bowel sounds Musculoskeletal:no cyanosis of digits and no clubbing  PSYCH: alert & oriented x 3 with fluent speech NEURO: no focal motor/sensory deficits  LABORATORY DATA:  I have reviewed the data as listed Lab Results  Component Value Date   WBC 5.5 07/13/2014   HGB 11.7* 07/13/2014   HCT 35.9* 07/13/2014   MCV 111.1* 07/13/2014   PLT 87* 07/13/2014    Recent Labs  04/12/14 1041 05/10/14 1357 06/07/14 1035 07/13/14 1051  NA 141 142 137 142  K 4.0 3.5 3.8 3.5  CL 105  --   --   --   CO2 '23 25 24 24  ' GLUCOSE 86 98 113 99  BUN 26* 25.9 27.6* 21.9  CREATININE 1.27 1.2 1.2 1.2  CALCIUM 9.3 9.6 9.3 8.5  PROT 6.0 5.7* 5.8* 5.4*  ALBUMIN 3.4* 3.2* 3.3* 3.1*  AST '19 24 17 18  ' ALT '19 31 20 24  ' ALKPHOS 45 61 52 49  BILITOT 0.7 0.52 0.40 0.68    ASSESSMENT & PLAN:  Multiple myeloma We  discussed treatment plan.  He was treated with bone marrow transplant but the effect did not last. Since he was placed on Revlimid, he achieved complete remission. I felt that the dose of Revlimid with is too strong and it is causing significant thrombocytopenia. Once he is done with current prescription, I we will lower dose of Revlimid to 10 mg, 21 days on 7 days off. I recommend he discontinue dexamethasone. I will proceed with Zometa today and then after the change him to every 3 months. I have also discontinue his acyclovir therapy. He will continue on 81 mg aspirin to prevent DVT. He will visit with dentist twice a year for dental checkup while on Zometa. He will remain on calcium with vitamin D supplement.  Anemia of chronic disease This is likely anemia of chronic disease. The patient denies recent history of bleeding such as epistaxis, hematuria or hematochezia. He is asymptomatic from the anemia. We will observe for now.  He does not require transfusion now.  I felt that lowering the dose of his Revlimid would help.  Hx of NEOP, MALIGNANT, PROSTATE He follows with urologist. His last PSA in July was 1.37  Lumbar compression fracture He had kyphoplasty. He remained on Zometa. I refilled his prescription fentanyl patch and oxycodone for back pain  Thrombocytopenia due to drugs This is likely due to recent treatment. The patient denies recent history of bleeding such as epistaxis, hematuria or hematochezia. He is asymptomatic from the low platelet count. I will observe for now.  he does not require transfusion now. I will continue the chemotherapy at current dose without dosage adjustment.  If the thrombocytopenia gets progressive worse in the future, I might have to delay his treatment or adjust the chemotherapy dose. Once his current prescription is finished, I will lower the dose of Revlimid.    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 He had tried  multiple different agents and he could not tolerate them. Recommend conservative management only.     Orders Placed This Encounter  Procedures  . CBC with Differential    Standing Status: Future  Number of Occurrences:      Standing Expiration Date: 08/17/2015  . Comprehensive metabolic panel    Standing Status: Future     Number of Occurrences:      Standing Expiration Date: 08/17/2015    All questions were answered. The patient knows to call the clinic with any problems, questions or concerns. I spent 30 minutes counseling the patient face to face. The total time spent in the appointment was 40 minutes and more than 50% was on counseling.     [place], [place], MD 07/13/2014 2:50 PM

## 2014-07-13 NOTE — Assessment & Plan Note (Signed)
This is likely due to recent treatment. The patient denies recent history of bleeding such as epistaxis, hematuria or hematochezia. He is asymptomatic from the low platelet count. I will observe for now.  he does not require transfusion now. I will continue the chemotherapy at current dose without dosage adjustment.  If the thrombocytopenia gets progressive worse in the future, I might have to delay his treatment or adjust the chemotherapy dose. Once his current prescription is finished, I will lower the dose of Revlimid.

## 2014-07-13 NOTE — Assessment & Plan Note (Signed)
This is only mild, grade 1. He had tried multiple different agents and he could not tolerate them. Recommend conservative management only.

## 2014-07-13 NOTE — Assessment & Plan Note (Addendum)
We discussed treatment plan.  He was treated with bone marrow transplant but the effect did not last. Since he was placed on Revlimid, he achieved complete remission. I felt that the dose of Revlimid with is too strong and it is causing significant thrombocytopenia. Once he is done with current prescription, I we will lower dose of Revlimid to 10 mg, 21 days on 7 days off. I recommend he discontinue dexamethasone. I will proceed with Zometa today and then after the change him to every 3 months. I have also discontinue his acyclovir therapy. He will continue on 81 mg aspirin to prevent DVT. He will visit with dentist twice a year for dental checkup while on Zometa. He will remain on calcium with vitamin D supplement.

## 2014-07-13 NOTE — Patient Instructions (Signed)

## 2014-07-13 NOTE — Telephone Encounter (Signed)
Pt says he is due to start his next cycle of Revlimid on 07/31/13 and he will complete this cycle on 08/20/14.   He told Dr. Alvy Bimler the wrong dates on his visit so he thinks his schedule will need to be adjusted accordingly.  She wanted to see him at the end of his next cycle.

## 2014-07-13 NOTE — Assessment & Plan Note (Signed)
He follows with urologist. His last PSA in July was 1.37

## 2014-07-13 NOTE — Patient Instructions (Signed)

## 2014-07-13 NOTE — Assessment & Plan Note (Signed)
This is likely anemia of chronic disease. The patient denies recent history of bleeding such as epistaxis, hematuria or hematochezia. He is asymptomatic from the anemia. We will observe for now.  He does not require transfusion now.  I felt that lowering the dose of his Revlimid would help.

## 2014-07-15 NOTE — Telephone Encounter (Signed)
Can you adjust appt to 1/25?

## 2014-07-16 ENCOUNTER — Telehealth: Payer: Self-pay | Admitting: Hematology and Oncology

## 2014-07-16 ENCOUNTER — Telehealth: Payer: Self-pay | Admitting: *Deleted

## 2014-07-16 NOTE — Telephone Encounter (Signed)
s.w. pt and advised on Jan appt d.t change....pt ok adn aware

## 2014-07-16 NOTE — Telephone Encounter (Signed)
Left VM for [name] informing his appts on 1/19 will be moved to 1/25 based on his message that he told Dr. Alvy Bimler the dates of his Revlimid cycle incorrectly.  Scheduler will call him w/ new times.  Asked him to call back w/ any questions.

## 2014-07-17 ENCOUNTER — Other Ambulatory Visit: Payer: Self-pay | Admitting: *Deleted

## 2014-07-17 DIAGNOSIS — C9000 Multiple myeloma not having achieved remission: Secondary | ICD-10-CM

## 2014-07-17 DIAGNOSIS — N08 Glomerular disorders in diseases classified elsewhere: Secondary | ICD-10-CM

## 2014-07-17 LAB — SPEP & IFE WITH QIG
Albumin ELP: 62.4 % (ref 55.8–66.1)
Alpha-1-Globulin: 5 % — ABNORMAL HIGH (ref 2.9–4.9)
Alpha-2-Globulin: 12.6 % — ABNORMAL HIGH (ref 7.1–11.8)
Beta 2: 3.6 % (ref 3.2–6.5)
Beta Globulin: 5.3 % (ref 4.7–7.2)
GAMMA GLOBULIN: 11.1 % (ref 11.1–18.8)
IGG (IMMUNOGLOBIN G), SERUM: 582 mg/dL — AB (ref 650–1600)
IgA: 69 mg/dL (ref 68–379)
IgM, Serum: 22 mg/dL — ABNORMAL LOW (ref 41–251)
Total Protein, Serum Electrophoresis: 5.4 g/dL — ABNORMAL LOW (ref 6.0–8.3)

## 2014-07-17 LAB — KAPPA/LAMBDA LIGHT CHAINS
KAPPA FREE LGHT CHN: 0.99 mg/dL (ref 0.33–1.94)
Kappa:Lambda Ratio: 0.97 (ref 0.26–1.65)
Lambda Free Lght Chn: 1.02 mg/dL (ref 0.57–2.63)

## 2014-07-17 MED ORDER — LENALIDOMIDE 10 MG PO CAPS: 10.0000 mg | ORAL_CAPSULE | Freq: Every day | ORAL | Status: DC

## 2014-07-17 NOTE — Telephone Encounter (Signed)
THIS REFILL REQUEST FOR REVLIMID WAS PLACED ON DR.GORSUCH'S DESK.

## 2014-07-18 ENCOUNTER — Telehealth: Payer: Self-pay | Admitting: Internal Medicine

## 2014-07-18 NOTE — Telephone Encounter (Signed)
Pt advised he was going to call Dr. Ledell Peoples office since he had been there before. Per note below pt did schedule and was seen on this date:  Gwenette Greet, RN at 03/23/2014 3:44 PM     Status: Signed       Expand All Collapse All   Pt called and states that he went to the dermatologist today to have the sore on his abdomen checked. It is MRSA. They have placed him on antibiotic for a month and a ointment he places in his nose and on the place. He just wanted to update Dr. Juliann Mule because he was worried it might be MRSA.Dr.Chism notified      Per Ebony Hail 12/23 pt is schedule to see Nelda Severe PA w/Dr. Ledell Peoples office on 01/15 @10 :40...Marland KitchenMarland KitchenMarland Kitchen

## 2014-08-02 ENCOUNTER — Ambulatory Visit: Payer: Medicare Other

## 2014-08-14 ENCOUNTER — Other Ambulatory Visit: Payer: Medicare Other

## 2014-08-14 ENCOUNTER — Ambulatory Visit: Payer: Medicare Other | Admitting: Hematology and Oncology

## 2014-08-20 ENCOUNTER — Other Ambulatory Visit (HOSPITAL_BASED_OUTPATIENT_CLINIC_OR_DEPARTMENT_OTHER): Payer: Medicare Other

## 2014-08-20 ENCOUNTER — Other Ambulatory Visit: Payer: Self-pay

## 2014-08-20 ENCOUNTER — Telehealth: Payer: Self-pay | Admitting: Hematology and Oncology

## 2014-08-20 ENCOUNTER — Ambulatory Visit (HOSPITAL_BASED_OUTPATIENT_CLINIC_OR_DEPARTMENT_OTHER): Payer: Medicare Other

## 2014-08-20 ENCOUNTER — Ambulatory Visit (HOSPITAL_BASED_OUTPATIENT_CLINIC_OR_DEPARTMENT_OTHER): Payer: Medicare Other | Admitting: Hematology and Oncology

## 2014-08-20 ENCOUNTER — Encounter: Payer: Self-pay | Admitting: Hematology and Oncology

## 2014-08-20 VITALS — BP 126/64 | HR 74 | Temp 97.6°F | Resp 18 | Ht 75.0 in | Wt 207.0 lb

## 2014-08-20 DIAGNOSIS — D638 Anemia in other chronic diseases classified elsewhere: Secondary | ICD-10-CM

## 2014-08-20 DIAGNOSIS — D630 Anemia in neoplastic disease: Secondary | ICD-10-CM

## 2014-08-20 DIAGNOSIS — D72818 Other decreased white blood cell count: Secondary | ICD-10-CM

## 2014-08-20 DIAGNOSIS — S32000A Wedge compression fracture of unspecified lumbar vertebra, initial encounter for closed fracture: Secondary | ICD-10-CM

## 2014-08-20 DIAGNOSIS — D72819 Decreased white blood cell count, unspecified: Secondary | ICD-10-CM | POA: Insufficient documentation

## 2014-08-20 DIAGNOSIS — D6959 Other secondary thrombocytopenia: Secondary | ICD-10-CM

## 2014-08-20 DIAGNOSIS — C9000 Multiple myeloma not having achieved remission: Secondary | ICD-10-CM

## 2014-08-20 DIAGNOSIS — Z95828 Presence of other vascular implants and grafts: Secondary | ICD-10-CM

## 2014-08-20 DIAGNOSIS — T451X5A Adverse effect of antineoplastic and immunosuppressive drugs, initial encounter: Secondary | ICD-10-CM

## 2014-08-20 DIAGNOSIS — T50905A Adverse effect of unspecified drugs, medicaments and biological substances, initial encounter: Secondary | ICD-10-CM

## 2014-08-20 DIAGNOSIS — N08 Glomerular disorders in diseases classified elsewhere: Principal | ICD-10-CM

## 2014-08-20 DIAGNOSIS — D701 Agranulocytosis secondary to cancer chemotherapy: Secondary | ICD-10-CM | POA: Insufficient documentation

## 2014-08-20 DIAGNOSIS — Z452 Encounter for adjustment and management of vascular access device: Secondary | ICD-10-CM

## 2014-08-20 DIAGNOSIS — C61 Malignant neoplasm of prostate: Secondary | ICD-10-CM

## 2014-08-20 LAB — CBC WITH DIFFERENTIAL/PLATELET
BASO%: 0.4 % (ref 0.0–2.0)
BASOS ABS: 0 10*3/uL (ref 0.0–0.1)
EOS%: 36.2 % — AB (ref 0.0–7.0)
Eosinophils Absolute: 1.2 10*3/uL — ABNORMAL HIGH (ref 0.0–0.5)
HEMATOCRIT: 34.8 % — AB (ref 38.4–49.9)
HGB: 11.4 g/dL — ABNORMAL LOW (ref 13.0–17.1)
LYMPH#: 0.8 10*3/uL — AB (ref 0.9–3.3)
LYMPH%: 24.1 % (ref 14.0–49.0)
MCH: 35.8 pg — ABNORMAL HIGH (ref 27.2–33.4)
MCHC: 32.7 g/dL (ref 32.0–36.0)
MCV: 109.6 fL — AB (ref 79.3–98.0)
MONO#: 0.6 10*3/uL (ref 0.1–0.9)
MONO%: 17.1 % — AB (ref 0.0–14.0)
NEUT#: 0.7 10*3/uL — ABNORMAL LOW (ref 1.5–6.5)
NEUT%: 22.2 % — AB (ref 39.0–75.0)
Platelets: 58 10*3/uL — ABNORMAL LOW (ref 140–400)
RBC: 3.17 10*6/uL — AB (ref 4.20–5.82)
RDW: 14 % (ref 11.0–14.6)
WBC: 3.2 10*3/uL — ABNORMAL LOW (ref 4.0–10.3)

## 2014-08-20 LAB — COMPREHENSIVE METABOLIC PANEL (CC13)
ALK PHOS: 46 U/L (ref 40–150)
ALT: 22 U/L (ref 0–55)
ANION GAP: 8 meq/L (ref 3–11)
AST: 21 U/L (ref 5–34)
Albumin: 3.2 g/dL — ABNORMAL LOW (ref 3.5–5.0)
BUN: 14 mg/dL (ref 7.0–26.0)
CO2: 25 mEq/L (ref 22–29)
CREATININE: 1.2 mg/dL (ref 0.7–1.3)
Calcium: 8.5 mg/dL (ref 8.4–10.4)
Chloride: 108 mEq/L (ref 98–109)
EGFR: 65 mL/min/{1.73_m2} — ABNORMAL LOW (ref >90)
Glucose: 110 mg/dl (ref 70–140)
Potassium: 3.5 mEq/L (ref 3.5–5.1)
SODIUM: 141 meq/L (ref 136–145)
TOTAL PROTEIN: 5.6 g/dL — AB (ref 6.4–8.3)
Total Bilirubin: 1.06 mg/dL (ref 0.20–1.20)

## 2014-08-20 MED ORDER — LENALIDOMIDE 5 MG PO CAPS: 5.0000 mg | ORAL_CAPSULE | Freq: Every day | ORAL | Status: DC

## 2014-08-20 MED ORDER — FENTANYL 75 MCG/HR TD PT72: 75.0000 ug | MEDICATED_PATCH | TRANSDERMAL | Status: DC

## 2014-08-20 MED ORDER — SODIUM CHLORIDE 0.9 % IJ SOLN
10.0000 mL | INTRAMUSCULAR | Status: DC | PRN
  Administered 2014-08-20: 10 mL via INTRAVENOUS
  Filled 2014-08-20: qty 10

## 2014-08-20 MED ORDER — HEPARIN SOD (PORK) LOCK FLUSH 100 UNIT/ML IV SOLN
500.0000 [IU] | Freq: Once | INTRAVENOUS | Status: AC
  Administered 2014-08-20: 500 [IU] via INTRAVENOUS
  Filled 2014-08-20: qty 5

## 2014-08-20 NOTE — Assessment & Plan Note (Signed)
This is likely anemia of chronic disease. The patient denies recent history of bleeding such as epistaxis, hematuria or hematochezia. He is asymptomatic from the anemia. We will observe for now.  He does not require transfusion now.  I felt that lowering the dose of his Revlimid would help.

## 2014-08-20 NOTE — Assessment & Plan Note (Signed)
This is likely due to recent treatment. The patient denies recent history of bleeding such as epistaxis, hematuria or hematochezia. He is asymptomatic from the low platelet count. I will observe for now.  he does not require transfusion now. I will continue the chemotherapy at current dose without dosage adjustment.  If the thrombocytopenia gets progressive worse in the future, I might have to delay his treatment or adjust the chemotherapy dose. Once his current prescription is finished, I will lower the dose of Revlimid.

## 2014-08-20 NOTE — Telephone Encounter (Signed)
Gave avs & calendar for February. °

## 2014-08-20 NOTE — Patient Instructions (Signed)

## 2014-08-20 NOTE — Assessment & Plan Note (Signed)
This is likely due to recent treatment. The patient denies recent history of fevers, cough, chills, diarrhea or dysuria. He is asymptomatic from the leukopenia. I will observe for now. I will reduce the dose of Revlimid as above.

## 2014-08-20 NOTE — Assessment & Plan Note (Signed)
He had kyphoplasty. He remained on Zometa. I refilled his prescription fentanyl patch for back pain

## 2014-08-20 NOTE — Assessment & Plan Note (Signed)
We discussed treatment plan.  He was treated with bone marrow transplant but the effect did not last. Since he was placed on Revlimid, he achieved complete remission. I felt that the dose of Revlimid with is too strong and it is causing significant pancytopenia, despite lowering the dose of Revlimid to 10 mg Once he is done with current prescription, I will lower dose of Revlimid to 5 mg, 21 days on 7 days off. I recommend he discontinue dexamethasone. I will proceed with Zometa today and then after the change him to every 3 months. I have also discontinue his acyclovir therapy. He will continue on 81 mg aspirin to prevent DVT. He will visit with dentist twice a year for dental checkup while on Zometa. He will remain on calcium with vitamin D supplement.

## 2014-08-20 NOTE — Progress Notes (Signed)
[place] OFFICE PROGRESS NOTE  Patient Care Team: Jinny Sanders, MD as PCP - General  SUMMARY OF ONCOLOGIC HISTORY:  Principle Diagnosis: Kappa light chain multiple myeloma, ISS III with diagnosis established in December 2012.  Prior Therapy:  1. Velcade, Cytoxan, and Decadron on a weekly basis from 07/10/2011 through 10/23/2011; 2. High-dose chemotherapy on 11/23/2011, consisting of carmustine 600 mg IV. He received cytarabine 400 mg IV on 12/24/2011, 12/25/2011, 12/26/2011, and 12/27/2011. He received etoposide 300 mg IV daily from 12/24/2011 through 12/27/2011, four doses. He received melphalan 280 mg IV on 12/28/2011.  3. 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  Current therapy: -Revlimid 10 mg daily, 3 weeks on, 1 week off. Revlimid was started on 05/11/2012.  Zometa was started on 09/02/2012.  Dental clearance was obtained.  Zometa 4 mg was placed on hold due to current osteomyelitis but restarted on 06/23/2013.    He also reports that his PSA is slowly rising and this may require future therapy by his urologist, Dr. Alinda Money. He remains active and reports backpain that he rates 2 out of 10 relieved with his fentanyl patch (increased to 75 mcg q 72 hours) and oxycodones prn.   He follows with Dr. Samule Ohm annually. INTERVAL HISTORY: Please see below for problem oriented charting. He feels well. Denies recent infection. He complained of fatigue. The patient denies any recent signs or symptoms of bleeding such as spontaneous epistaxis, hematuria or hematochezia.  REVIEW OF SYSTEMS:   Constitutional: Denies fevers, chills or abnormal weight loss Eyes: Denies blurriness of vision Ears, nose, mouth, throat, and face: Denies mucositis or sore throat Respiratory: Denies cough, dyspnea or wheezes Cardiovascular: Denies palpitation, chest discomfort or lower extremity swelling Gastrointestinal:  Denies nausea, heartburn or change in bowel habits Skin:  Denies abnormal skin rashes Lymphatics: Denies new lymphadenopathy or easy bruising Neurological:Denies numbness, tingling or new weaknesses Behavioral/Psych: Mood is stable, no new changes  All other systems were reviewed with the patient and are negative.  I have reviewed the past medical history, past surgical history, social history and family history with the patient and they are unchanged from previous note.  ALLERGIES:  is allergic to celebrex; cymbalta; meloxicam; percocet; vicodin; and lyrica.  MEDICATIONS:  Current Outpatient Prescriptions  Medication Sig Dispense Refill  . aspirin 81 MG tablet Take 81 mg by mouth daily.    Marland Kitchen b complex vitamins tablet Take 1 tablet by mouth daily.    . calcium carbonate 200 MG capsule Take 750 mg by mouth daily.    . cholecalciferol (VITAMIN D) 1000 UNITS tablet Take 1,000 Units by mouth daily.    . fentaNYL (DURAGESIC - DOSED MCG/HR) 75 MCG/HR Place 1 patch (75 mcg total) onto the skin every 3 (three) days. 10 patch 0  . lenalidomide (REVLIMID) 5 MG capsule Take 1 capsule (5 mg total) by mouth daily. For 21 days then rest for 7 days and repeat. 21 capsule 0  . lidocaine-prilocaine (EMLA) cream Apply topically as needed. Apply to port site one hour before treatment and cover with plastic wrap 30 g 2  . NONFORMULARY OR COMPOUNDED ITEM Baclofn/amitrp/ketamn cream topically to feet TID prn for neuropathy    . oxyCODONE (OXY IR/ROXICODONE) 5 MG immediate release tablet Take 1 tablet (5 mg total) by mouth every 4 (four) hours as needed for severe pain. 60 tablet 0  . pantoprazole (PROTONIX) 40 MG tablet Take 1 tablet (40 mg total) by mouth daily. 90 tablet 3  .  vitamin C (ASCORBIC ACID) 500 MG tablet Take 1 tablet by mouth daily.    . prochlorperazine (COMPAZINE) 10 MG tablet Take 10 mg by mouth every 6 (six) hours as needed.     No current facility-administered medications for this visit.    PHYSICAL EXAMINATION: ECOG PERFORMANCE STATUS: 0 -  Asymptomatic  Filed Vitals:   08/20/14 1120  BP: 126/64  Pulse: 74  Temp: 97.6 F (36.4 C)  Resp: 18   Filed Weights   08/20/14 1120  Weight: 207 lb (93.895 kg)    GENERAL:alert, no distress and comfortable SKIN: skin color, texture, turgor are normal, no rashes or significant lesions EYES: normal, Conjunctiva are pink and non-injected, sclera clear Musculoskeletal:no cyanosis of digits and no clubbing  NEURO: alert & oriented x 3 with fluent speech, no focal motor/sensory deficits  LABORATORY DATA:  I have reviewed the data as listed    Component Value Date/Time   NA 141 08/20/2014 1055   NA 141 04/12/2014 1041   K 3.5 08/20/2014 1055   K 4.0 04/12/2014 1041   CL 105 04/12/2014 1041   CL 110* 12/29/2012 1151   CO2 25 08/20/2014 1055   CO2 23 04/12/2014 1041   GLUCOSE 110 08/20/2014 1055   GLUCOSE 86 04/12/2014 1041   GLUCOSE 127* 12/29/2012 1151   BUN 14.0 08/20/2014 1055   BUN 26* 04/12/2014 1041   CREATININE 1.2 08/20/2014 1055   CREATININE 1.27 04/12/2014 1041   CALCIUM 8.5 08/20/2014 1055   CALCIUM 9.3 04/12/2014 1041   PROT 5.6* 08/20/2014 1055   PROT 6.0 04/12/2014 1041   ALBUMIN 3.2* 08/20/2014 1055   ALBUMIN 3.4* 04/12/2014 1041   AST 21 08/20/2014 1055   AST 19 04/12/2014 1041   ALT 22 08/20/2014 1055   ALT 19 04/12/2014 1041   ALKPHOS 46 08/20/2014 1055   ALKPHOS 45 04/12/2014 1041   BILITOT 1.06 08/20/2014 1055   BILITOT 0.7 04/12/2014 1041   GFRNONAA 55* 10/13/2011 0817   GFRAA 64* 10/13/2011 0817    No results found for: SPEP, UPEP  Lab Results  Component Value Date   WBC 3.2* 08/20/2014   NEUTROABS 0.7* 08/20/2014   HGB 11.4* 08/20/2014   HCT 34.8* 08/20/2014   MCV 109.6* 08/20/2014   PLT 58* 08/20/2014      Chemistry      Component Value Date/Time   NA 141 08/20/2014 1055   NA 141 04/12/2014 1041   K 3.5 08/20/2014 1055   K 4.0 04/12/2014 1041   CL 105 04/12/2014 1041   CL 110* 12/29/2012 1151   CO2 25 08/20/2014 1055    CO2 23 04/12/2014 1041   BUN 14.0 08/20/2014 1055   BUN 26* 04/12/2014 1041   CREATININE 1.2 08/20/2014 1055   CREATININE 1.27 04/12/2014 1041      Component Value Date/Time   CALCIUM 8.5 08/20/2014 1055   CALCIUM 9.3 04/12/2014 1041   ALKPHOS 46 08/20/2014 1055   ALKPHOS 45 04/12/2014 1041   AST 21 08/20/2014 1055   AST 19 04/12/2014 1041   ALT 22 08/20/2014 1055   ALT 19 04/12/2014 1041   BILITOT 1.06 08/20/2014 1055   BILITOT 0.7 04/12/2014 1041     ASSESSMENT & PLAN:  Multiple myeloma We discussed treatment plan.  He was treated with bone marrow transplant but the effect did not last. Since he was placed on Revlimid, he achieved complete remission. I felt that the dose of Revlimid with is too strong and it is causing significant  pancytopenia, despite lowering the dose of Revlimid to 10 mg Once he is done with current prescription, I will lower dose of Revlimid to 5 mg, 21 days on 7 days off. I recommend he discontinue dexamethasone. I will proceed with Zometa today and then after the change him to every 3 months. I have also discontinue his acyclovir therapy. He will continue on 81 mg aspirin to prevent DVT. He will visit with dentist twice a year for dental checkup while on Zometa. He will remain on calcium with vitamin D supplement.   Anemia of chronic disease This is likely anemia of chronic disease. The patient denies recent history of bleeding such as epistaxis, hematuria or hematochezia. He is asymptomatic from the anemia. We will observe for now.  He does not require transfusion now.  I felt that lowering the dose of his Revlimid would help.    Leukopenia due to antineoplastic chemotherapy This is likely due to recent treatment. The patient denies recent history of fevers, cough, chills, diarrhea or dysuria. He is asymptomatic from the leukopenia. I will observe for now. I will reduce the dose of Revlimid as above.    Thrombocytopenia due to drugs This is  likely due to recent treatment. The patient denies recent history of bleeding such as epistaxis, hematuria or hematochezia. He is asymptomatic from the low platelet count. I will observe for now.  he does not require transfusion now. I will continue the chemotherapy at current dose without dosage adjustment.  If the thrombocytopenia gets progressive worse in the future, I might have to delay his treatment or adjust the chemotherapy dose. Once his current prescription is finished, I will lower the dose of Revlimid.     Lumbar compression fracture He had kyphoplasty. He remained on Zometa. I refilled his prescription fentanyl patch for back pain    Orders Placed This Encounter  Procedures  . CBC with Differential/Platelet    Standing Status: Future     Number of Occurrences:      Standing Expiration Date: 09/24/2015  . Comprehensive metabolic panel    Standing Status: Future     Number of Occurrences:      Standing Expiration Date: 09/24/2015  . Lactate dehydrogenase    Standing Status: Future     Number of Occurrences:      Standing Expiration Date: 09/24/2015  . SPEP & IFE with QIG    Standing Status: Future     Number of Occurrences:      Standing Expiration Date: 09/24/2015  . Kappa/lambda light chains    Standing Status: Future     Number of Occurrences:      Standing Expiration Date: 09/24/2015  . Beta 2 microglobulin, serum    Standing Status: Future     Number of Occurrences:      Standing Expiration Date: 09/24/2015   All questions were answered. The patient knows to call the clinic with any problems, questions or concerns. No barriers to learning was detected. I spent 25 minutes counseling the patient face to face. The total time spent in the appointment was 30 minutes and more than 50% was on counseling and review of test results     [facility], [place], MD 08/20/2014 12:27 PM

## 2014-08-22 ENCOUNTER — Other Ambulatory Visit: Payer: Self-pay | Admitting: *Deleted

## 2014-08-22 DIAGNOSIS — C9000 Multiple myeloma not having achieved remission: Secondary | ICD-10-CM

## 2014-08-22 MED ORDER — LENALIDOMIDE 5 MG PO CAPS: ORAL_CAPSULE | ORAL | Status: DC

## 2014-09-14 ENCOUNTER — Telehealth: Payer: Self-pay | Admitting: Hematology and Oncology

## 2014-09-14 ENCOUNTER — Other Ambulatory Visit (HOSPITAL_BASED_OUTPATIENT_CLINIC_OR_DEPARTMENT_OTHER): Payer: Medicare Other

## 2014-09-14 ENCOUNTER — Ambulatory Visit (HOSPITAL_BASED_OUTPATIENT_CLINIC_OR_DEPARTMENT_OTHER): Payer: Medicare Other

## 2014-09-14 ENCOUNTER — Ambulatory Visit (HOSPITAL_BASED_OUTPATIENT_CLINIC_OR_DEPARTMENT_OTHER): Payer: Medicare Other | Admitting: Hematology and Oncology

## 2014-09-14 VITALS — BP 127/64 | HR 69 | Temp 98.0°F | Resp 18 | Ht 75.0 in | Wt 203.7 lb

## 2014-09-14 DIAGNOSIS — D701 Agranulocytosis secondary to cancer chemotherapy: Secondary | ICD-10-CM

## 2014-09-14 DIAGNOSIS — N08 Glomerular disorders in diseases classified elsewhere: Secondary | ICD-10-CM

## 2014-09-14 DIAGNOSIS — S32000A Wedge compression fracture of unspecified lumbar vertebra, initial encounter for closed fracture: Secondary | ICD-10-CM

## 2014-09-14 DIAGNOSIS — T50905A Adverse effect of unspecified drugs, medicaments and biological substances, initial encounter: Secondary | ICD-10-CM

## 2014-09-14 DIAGNOSIS — C9000 Multiple myeloma not having achieved remission: Secondary | ICD-10-CM

## 2014-09-14 DIAGNOSIS — D61810 Antineoplastic chemotherapy induced pancytopenia: Secondary | ICD-10-CM

## 2014-09-14 DIAGNOSIS — Z452 Encounter for adjustment and management of vascular access device: Secondary | ICD-10-CM

## 2014-09-14 DIAGNOSIS — D638 Anemia in other chronic diseases classified elsewhere: Secondary | ICD-10-CM

## 2014-09-14 DIAGNOSIS — D61818 Other pancytopenia: Secondary | ICD-10-CM

## 2014-09-14 DIAGNOSIS — D6959 Other secondary thrombocytopenia: Secondary | ICD-10-CM

## 2014-09-14 DIAGNOSIS — C61 Malignant neoplasm of prostate: Secondary | ICD-10-CM

## 2014-09-14 DIAGNOSIS — T451X5A Adverse effect of antineoplastic and immunosuppressive drugs, initial encounter: Secondary | ICD-10-CM

## 2014-09-14 DIAGNOSIS — Z95828 Presence of other vascular implants and grafts: Secondary | ICD-10-CM

## 2014-09-14 LAB — COMPREHENSIVE METABOLIC PANEL (CC13)
ALK PHOS: 46 U/L (ref 40–150)
ALT: 17 U/L (ref 0–55)
AST: 18 U/L (ref 5–34)
Albumin: 3.3 g/dL — ABNORMAL LOW (ref 3.5–5.0)
Anion Gap: 9 mEq/L (ref 3–11)
BUN: 14.7 mg/dL (ref 7.0–26.0)
CHLORIDE: 107 meq/L (ref 98–109)
CO2: 23 meq/L (ref 22–29)
Calcium: 8.8 mg/dL (ref 8.4–10.4)
Creatinine: 1.2 mg/dL (ref 0.7–1.3)
EGFR: 63 mL/min/{1.73_m2} — ABNORMAL LOW (ref >90)
GLUCOSE: 115 mg/dL (ref 70–140)
POTASSIUM: 3.7 meq/L (ref 3.5–5.1)
SODIUM: 139 meq/L (ref 136–145)
Total Bilirubin: 0.93 mg/dL (ref 0.20–1.20)
Total Protein: 5.6 g/dL — ABNORMAL LOW (ref 6.4–8.3)

## 2014-09-14 LAB — CBC WITH DIFFERENTIAL/PLATELET
BASO%: 1.2 % (ref 0.0–2.0)
BASOS ABS: 0 10*3/uL (ref 0.0–0.1)
EOS ABS: 0.4 10*3/uL (ref 0.0–0.5)
EOS%: 10.5 % — ABNORMAL HIGH (ref 0.0–7.0)
HCT: 35.7 % — ABNORMAL LOW (ref 38.4–49.9)
HEMOGLOBIN: 12.2 g/dL — AB (ref 13.0–17.1)
LYMPH#: 0.7 10*3/uL — AB (ref 0.9–3.3)
LYMPH%: 21 % (ref 14.0–49.0)
MCH: 36.2 pg — ABNORMAL HIGH (ref 27.2–33.4)
MCHC: 34.2 g/dL (ref 32.0–36.0)
MCV: 105.9 fL — AB (ref 79.3–98.0)
MONO#: 0.6 10*3/uL (ref 0.1–0.9)
MONO%: 18.6 % — ABNORMAL HIGH (ref 0.0–14.0)
NEUT%: 48.7 % (ref 39.0–75.0)
NEUTROS ABS: 1.6 10*3/uL (ref 1.5–6.5)
Platelets: 85 10*3/uL — ABNORMAL LOW (ref 140–400)
RBC: 3.37 10*6/uL — AB (ref 4.20–5.82)
RDW: 13.8 % (ref 11.0–14.6)
WBC: 3.3 10*3/uL — AB (ref 4.0–10.3)
nRBC: 0 % (ref 0–0)

## 2014-09-14 LAB — LACTATE DEHYDROGENASE (CC13): LDH: 172 U/L (ref 125–245)

## 2014-09-14 MED ORDER — SODIUM CHLORIDE 0.9 % IJ SOLN
10.0000 mL | INTRAMUSCULAR | Status: DC | PRN
  Filled 2014-09-14: qty 10

## 2014-09-14 MED ORDER — HEPARIN SOD (PORK) LOCK FLUSH 100 UNIT/ML IV SOLN
500.0000 [IU] | Freq: Once | INTRAVENOUS | Status: AC
  Administered 2014-09-14: 500 [IU] via INTRAVENOUS
  Filled 2014-09-14: qty 5

## 2014-09-14 MED ORDER — SODIUM CHLORIDE 0.9 % IJ SOLN
10.0000 mL | INTRAMUSCULAR | Status: DC | PRN
  Administered 2014-09-14: 10 mL via INTRAVENOUS
  Filled 2014-09-14: qty 10

## 2014-09-14 NOTE — Patient Instructions (Signed)

## 2014-09-14 NOTE — Telephone Encounter (Signed)
Gave avs & calendar for march.

## 2014-09-15 NOTE — Assessment & Plan Note (Signed)
We discussed treatment plan.  He was treated with bone marrow transplant but the effect did not last. Since he was placed on Revlimid, he achieved complete remission. I felt that the dose of Revlimid with is too strong and it is causing significant pancytopenia, despite lowering the dose of Revlimid to 10 mg and recently to 5 mg. He will continue on 81 mg aspirin to prevent DVT. He will visit with dentist twice a year for dental checkup while on Zometa. He will remain on calcium with vitamin D supplement. If the next set of labs showed worsening pancytopenia, I might have to reduce the dose further to 2.5 mg

## 2014-09-15 NOTE — Progress Notes (Signed)
[place] OFFICE PROGRESS NOTE  Patient Care Team: Jinny Sanders, MD as PCP - General  SUMMARY OF ONCOLOGIC HISTORY:  Principle Diagnosis: Kappa light chain multiple myeloma, ISS III with diagnosis established in December 2012.  Prior Therapy:  1. Velcade, Cytoxan, and Decadron on a weekly basis from 07/10/2011 through 10/23/2011; 2. High-dose chemotherapy on 11/23/2011, consisting of carmustine 600 mg IV. He received cytarabine 400 mg IV on 12/24/2011, 12/25/2011, 12/26/2011, and 12/27/2011. He received etoposide 300 mg IV daily from 12/24/2011 through 12/27/2011, four doses. He received melphalan 280 mg IV on 12/28/2011.  3. 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  Current therapy: -Revlimid 5 mg daily, 3 weeks on, 1 week off. Revlimid was started on 05/11/2012.  Zometa was started on 09/02/2012.  Dental clearance was obtained.  Zometa 4 mg was placed on hold due to current osteomyelitis but restarted on 06/23/2013.    He also reports that his PSA is slowly rising and this may require future therapy by his urologist, Dr. Alinda Money.   INTERVAL HISTORY: Please see below for problem oriented charting. He feels well. Denies pain. He rarely takes breakthrough pain medication. He complained of fatigue and mild sedation.  REVIEW OF SYSTEMS:   Constitutional: Denies fevers, chills or abnormal weight loss Eyes: Denies blurriness of vision Ears, nose, mouth, throat, and face: Denies mucositis or sore throat Respiratory: Denies cough, dyspnea or wheezes Cardiovascular: Denies palpitation, chest discomfort or lower extremity swelling Gastrointestinal:  Denies nausea, heartburn or change in bowel habits Skin: Denies abnormal skin rashes Lymphatics: Denies new lymphadenopathy or easy bruising Neurological:Denies numbness, tingling or new weaknesses Behavioral/Psych: Mood is stable, no new changes  All other systems were reviewed with the patient and are  negative.  I have reviewed the past medical history, past surgical history, social history and family history with the patient and they are unchanged from previous note.  ALLERGIES:  is allergic to celebrex; cymbalta; meloxicam; percocet; vicodin; and lyrica.  MEDICATIONS:  Current Outpatient Prescriptions  Medication Sig Dispense Refill  . aspirin 81 MG tablet Take 81 mg by mouth daily.    Marland Kitchen b complex vitamins tablet Take 1 tablet by mouth daily.    . calcium carbonate 200 MG capsule Take 750 mg by mouth daily.    . cholecalciferol (VITAMIN D) 1000 UNITS tablet Take 1,000 Units by mouth daily.    . fentaNYL (DURAGESIC - DOSED MCG/HR) 75 MCG/HR Place 1 patch (75 mcg total) onto the skin every 3 (three) days. 10 patch 0  . lenalidomide (REVLIMID) 5 MG capsule Take one capsule by mouth daily for 21 days, then off 7 days. 21 capsule 0  . lidocaine-prilocaine (EMLA) cream Apply topically as needed. Apply to port site one hour before treatment and cover with plastic wrap 30 g 2  . NONFORMULARY OR COMPOUNDED ITEM Baclofn/amitrp/ketamn cream topically to feet TID prn for neuropathy    . oxyCODONE (OXY IR/ROXICODONE) 5 MG immediate release tablet Take 1 tablet (5 mg total) by mouth every 4 (four) hours as needed for severe pain. 60 tablet 0  . pantoprazole (PROTONIX) 40 MG tablet Take 1 tablet (40 mg total) by mouth daily. 90 tablet 3  . vitamin C (ASCORBIC ACID) 500 MG tablet Take 1 tablet by mouth daily.    . prochlorperazine (COMPAZINE) 10 MG tablet Take 10 mg by mouth every 6 (six) hours as needed.     No current facility-administered medications for this visit.    PHYSICAL EXAMINATION:  ECOG PERFORMANCE STATUS: 1 - Symptomatic but completely ambulatory  Filed Vitals:   09/14/14 1156  BP: 127/64  Pulse: 69  Temp: 98 F (36.7 C)  Resp: 18   Filed Weights   09/14/14 1156  Weight: 203 lb 11.2 oz (92.398 kg)    GENERAL:alert, no distress and comfortable SKIN: skin color, texture,  turgor are normal, no rashes or significant lesions EYES: normal, Conjunctiva are pink and non-injected, sclera clear OROPHARYNX:no exudate, no erythema and lips, buccal mucosa, and tongue normal  NECK: supple, thyroid normal size, non-tender, without nodularity LYMPH:  no palpable lymphadenopathy in the cervical, axillary or inguinal LUNGS: clear to auscultation and percussion with normal breathing effort HEART: regular rate & rhythm and no murmurs and no lower extremity edema ABDOMEN:abdomen soft, non-tender and normal bowel sounds Musculoskeletal:no cyanosis of digits and no clubbing  NEURO: alert & oriented x 3 with fluent speech, no focal motor/sensory deficits  LABORATORY DATA:  I have reviewed the data as listed    Component Value Date/Time   NA 139 09/14/2014 1056   NA 141 04/12/2014 1041   K 3.7 09/14/2014 1056   K 4.0 04/12/2014 1041   CL 105 04/12/2014 1041   CL 110* 12/29/2012 1151   CO2 23 09/14/2014 1056   CO2 23 04/12/2014 1041   GLUCOSE 115 09/14/2014 1056   GLUCOSE 86 04/12/2014 1041   GLUCOSE 127* 12/29/2012 1151   BUN 14.7 09/14/2014 1056   BUN 26* 04/12/2014 1041   CREATININE 1.2 09/14/2014 1056   CREATININE 1.27 04/12/2014 1041   CALCIUM 8.8 09/14/2014 1056   CALCIUM 9.3 04/12/2014 1041   PROT 5.6* 09/14/2014 1056   PROT 6.0 04/12/2014 1041   ALBUMIN 3.3* 09/14/2014 1056   ALBUMIN 3.4* 04/12/2014 1041   AST 18 09/14/2014 1056   AST 19 04/12/2014 1041   ALT 17 09/14/2014 1056   ALT 19 04/12/2014 1041   ALKPHOS 46 09/14/2014 1056   ALKPHOS 45 04/12/2014 1041   BILITOT 0.93 09/14/2014 1056   BILITOT 0.7 04/12/2014 1041   GFRNONAA 55* 10/13/2011 0817   GFRAA 64* 10/13/2011 0817    No results found for: SPEP, UPEP  Lab Results  Component Value Date   WBC 3.3* 09/14/2014   NEUTROABS 1.6 09/14/2014   HGB 12.2* 09/14/2014   HCT 35.7* 09/14/2014   MCV 105.9* 09/14/2014   PLT 85* 09/14/2014      Chemistry      Component Value Date/Time   NA  139 09/14/2014 1056   NA 141 04/12/2014 1041   K 3.7 09/14/2014 1056   K 4.0 04/12/2014 1041   CL 105 04/12/2014 1041   CL 110* 12/29/2012 1151   CO2 23 09/14/2014 1056   CO2 23 04/12/2014 1041   BUN 14.7 09/14/2014 1056   BUN 26* 04/12/2014 1041   CREATININE 1.2 09/14/2014 1056   CREATININE 1.27 04/12/2014 1041      Component Value Date/Time   CALCIUM 8.8 09/14/2014 1056   CALCIUM 9.3 04/12/2014 1041   ALKPHOS 46 09/14/2014 1056   ALKPHOS 45 04/12/2014 1041   AST 18 09/14/2014 1056   AST 19 04/12/2014 1041   ALT 17 09/14/2014 1056   ALT 19 04/12/2014 1041   BILITOT 0.93 09/14/2014 1056   BILITOT 0.7 04/12/2014 1041      ASSESSMENT & PLAN:  Multiple myeloma We discussed treatment plan.  He was treated with bone marrow transplant but the effect did not last. Since he was placed on Revlimid, he achieved  complete remission. I felt that the dose of Revlimid with is too strong and it is causing significant pancytopenia, despite lowering the dose of Revlimid to 10 mg and recently to 5 mg. He will continue on 81 mg aspirin to prevent DVT. He will visit with dentist twice a year for dental checkup while on Zometa. He will remain on calcium with vitamin D supplement. If the next set of labs showed worsening pancytopenia, I might have to reduce the dose further to 2.5 mg   Anemia of chronic disease This is likely anemia of chronic disease. The patient denies recent history of bleeding such as epistaxis, hematuria or hematochezia. He is asymptomatic from the anemia. We will observe for now.  He does not require transfusion now.  I felt that lowering the dose of his Revlimid would help.   Thrombocytopenia due to drugs This is likely due to recent treatment. The patient denies recent history of bleeding such as epistaxis, hematuria or hematochezia. He is asymptomatic from the low platelet count. I will observe for now.  he does not require transfusion now. I will continue the  chemotherapy at current dose without dosage adjustment.  If the thrombocytopenia gets progressive worse in the future, I might have to delay his treatment or adjust the chemotherapy dose. Once his current prescription is finished, I will lower the dose of Revlimid.    Lumbar compression fracture He had kyphoplasty. He remained on Zometa. I refilled his prescription fentanyl patch for back pain. He is not taking breakthrough pain medication. I plan to lower the future dose of fentanyl to 50 mcg q 72 hours     Leukopenia due to antineoplastic chemotherapy This is likely due to recent treatment. The patient denies recent history of fevers, cough, chills, diarrhea or dysuria. He is asymptomatic from the leukopenia. I will observe for now. I will reduce the dose of Revlimid as above.     Orders Placed This Encounter  Procedures  . CBC with Differential/Platelet    Standing Status: Standing     Number of Occurrences: 9     Standing Expiration Date: 09/15/2015  . Comprehensive metabolic panel    Standing Status: Standing     Number of Occurrences: 9     Standing Expiration Date: 09/15/2015   All questions were answered. The patient knows to call the clinic with any problems, questions or concerns. No barriers to learning was detected. I spent 25 minutes counseling the patient face to face. The total time spent in the appointment was 30 minutes and more than 50% was on counseling and review of test results     [facility], [place], MD 09/15/2014 7:27 PM

## 2014-09-15 NOTE — Assessment & Plan Note (Signed)
This is likely anemia of chronic disease. The patient denies recent history of bleeding such as epistaxis, hematuria or hematochezia. He is asymptomatic from the anemia. We will observe for now.  He does not require transfusion now.  I felt that lowering the dose of his Revlimid would help.

## 2014-09-15 NOTE — Assessment & Plan Note (Signed)
He had kyphoplasty. He remained on Zometa. I refilled his prescription fentanyl patch for back pain. He is not taking breakthrough pain medication. I plan to lower the future dose of fentanyl to 50 mcg q 72 hours

## 2014-09-15 NOTE — Assessment & Plan Note (Signed)
This is likely due to recent treatment. The patient denies recent history of fevers, cough, chills, diarrhea or dysuria. He is asymptomatic from the leukopenia. I will observe for now. I will reduce the dose of Revlimid as above.

## 2014-09-15 NOTE — Assessment & Plan Note (Signed)
This is likely due to recent treatment. The patient denies recent history of bleeding such as epistaxis, hematuria or hematochezia. He is asymptomatic from the low platelet count. I will observe for now.  he does not require transfusion now. I will continue the chemotherapy at current dose without dosage adjustment.  If the thrombocytopenia gets progressive worse in the future, I might have to delay his treatment or adjust the chemotherapy dose. Once his current prescription is finished, I will lower the dose of Revlimid.

## 2014-09-18 ENCOUNTER — Other Ambulatory Visit: Payer: Self-pay | Admitting: *Deleted

## 2014-09-18 DIAGNOSIS — C9000 Multiple myeloma not having achieved remission: Secondary | ICD-10-CM

## 2014-09-18 LAB — SPEP & IFE WITH QIG
Albumin ELP: 59.7 % (ref 55.8–66.1)
Alpha-1-Globulin: 4.9 % (ref 2.9–4.9)
Alpha-2-Globulin: 11.3 % (ref 7.1–11.8)
BETA GLOBULIN: 5 % (ref 4.7–7.2)
Beta 2: 4.3 % (ref 3.2–6.5)
Gamma Globulin: 14.8 % (ref 11.1–18.8)
IGG (IMMUNOGLOBIN G), SERUM: 950 mg/dL (ref 650–1600)
IgA: 93 mg/dL (ref 68–379)
IgM, Serum: 37 mg/dL — ABNORMAL LOW (ref 41–251)
Total Protein, Serum Electrophoresis: 4.6 g/dL — ABNORMAL LOW (ref 6.0–8.3)

## 2014-09-18 LAB — KAPPA/LAMBDA LIGHT CHAINS
KAPPA LAMBDA RATIO: 1.33 (ref 0.26–1.65)
Kappa free light chain: 3.12 mg/dL — ABNORMAL HIGH (ref 0.33–1.94)
LAMBDA FREE LGHT CHN: 2.34 mg/dL (ref 0.57–2.63)

## 2014-09-18 LAB — BETA 2 MICROGLOBULIN, SERUM: Beta-2 Microglobulin: 4.03 mg/L — ABNORMAL HIGH (ref <=2.51)

## 2014-09-18 MED ORDER — LENALIDOMIDE 5 MG PO CAPS
ORAL_CAPSULE | ORAL | Status: DC
Start: 2014-09-18 — End: 2014-10-16

## 2014-10-08 ENCOUNTER — Other Ambulatory Visit: Payer: Self-pay | Admitting: Hematology and Oncology

## 2014-10-08 ENCOUNTER — Telehealth: Payer: Self-pay | Admitting: *Deleted

## 2014-10-08 DIAGNOSIS — S32000A Wedge compression fracture of unspecified lumbar vertebra, initial encounter for closed fracture: Secondary | ICD-10-CM

## 2014-10-08 DIAGNOSIS — C9000 Multiple myeloma not having achieved remission: Secondary | ICD-10-CM

## 2014-10-08 DIAGNOSIS — C61 Malignant neoplasm of prostate: Secondary | ICD-10-CM

## 2014-10-08 DIAGNOSIS — N08 Glomerular disorders in diseases classified elsewhere: Secondary | ICD-10-CM

## 2014-10-08 MED ORDER — FENTANYL 50 MCG/HR TD PT72: 50.0000 ug | MEDICATED_PATCH | TRANSDERMAL | Status: DC

## 2014-10-08 NOTE — Telephone Encounter (Signed)
Per last discussion we are lowering his dose to 50 mcg Script is ready with Tammi

## 2014-10-08 NOTE — Telephone Encounter (Signed)
Call received from patient who reports he has "one Fentanyl 75 mcg patch left and would like refill from Dr. Alvy Bimler."  Will notify Dr. Alvy Bimler of this refill request. Return call number [phone].

## 2014-10-08 NOTE — Telephone Encounter (Signed)
PT. SEES [place] ON Friday. HE WILL PICK UP HIS PRESCRIPTION AT THAT TIME. THIS NOTE WAS ROUTED TO TAMMI HOLLAND,RN.

## 2014-10-12 ENCOUNTER — Other Ambulatory Visit: Payer: Self-pay

## 2014-10-12 ENCOUNTER — Telehealth: Payer: Self-pay | Admitting: Hematology and Oncology

## 2014-10-12 ENCOUNTER — Other Ambulatory Visit (HOSPITAL_BASED_OUTPATIENT_CLINIC_OR_DEPARTMENT_OTHER): Payer: Medicare Other

## 2014-10-12 ENCOUNTER — Encounter: Payer: Self-pay | Admitting: Hematology and Oncology

## 2014-10-12 ENCOUNTER — Ambulatory Visit (HOSPITAL_BASED_OUTPATIENT_CLINIC_OR_DEPARTMENT_OTHER): Payer: Medicare Other

## 2014-10-12 ENCOUNTER — Ambulatory Visit (HOSPITAL_BASED_OUTPATIENT_CLINIC_OR_DEPARTMENT_OTHER): Payer: Medicare Other | Admitting: Hematology and Oncology

## 2014-10-12 VITALS — BP 121/54 | HR 68 | Temp 98.0°F | Resp 18 | Ht 75.0 in | Wt 201.2 lb

## 2014-10-12 DIAGNOSIS — C9000 Multiple myeloma not having achieved remission: Secondary | ICD-10-CM | POA: Diagnosis not present

## 2014-10-12 DIAGNOSIS — D6959 Other secondary thrombocytopenia: Secondary | ICD-10-CM

## 2014-10-12 DIAGNOSIS — T50905A Adverse effect of unspecified drugs, medicaments and biological substances, initial encounter: Secondary | ICD-10-CM

## 2014-10-12 DIAGNOSIS — T451X5A Adverse effect of antineoplastic and immunosuppressive drugs, initial encounter: Secondary | ICD-10-CM

## 2014-10-12 DIAGNOSIS — S32000S Wedge compression fracture of unspecified lumbar vertebra, sequela: Secondary | ICD-10-CM

## 2014-10-12 DIAGNOSIS — Z95828 Presence of other vascular implants and grafts: Secondary | ICD-10-CM

## 2014-10-12 DIAGNOSIS — M8448XA Pathological fracture, other site, initial encounter for fracture: Secondary | ICD-10-CM

## 2014-10-12 DIAGNOSIS — D638 Anemia in other chronic diseases classified elsewhere: Secondary | ICD-10-CM | POA: Diagnosis not present

## 2014-10-12 DIAGNOSIS — D701 Agranulocytosis secondary to cancer chemotherapy: Secondary | ICD-10-CM

## 2014-10-12 LAB — CBC WITH DIFFERENTIAL/PLATELET
BASO%: 1.2 % (ref 0.0–2.0)
Basophils Absolute: 0 10*3/uL (ref 0.0–0.1)
EOS%: 5.9 % (ref 0.0–7.0)
Eosinophils Absolute: 0.2 10*3/uL (ref 0.0–0.5)
HEMATOCRIT: 36.8 % — AB (ref 38.4–49.9)
HGB: 12.3 g/dL — ABNORMAL LOW (ref 13.0–17.1)
LYMPH%: 18 % (ref 14.0–49.0)
MCH: 36.4 pg — AB (ref 27.2–33.4)
MCHC: 33.6 g/dL (ref 32.0–36.0)
MCV: 108.5 fL — ABNORMAL HIGH (ref 79.3–98.0)
MONO#: 0.6 10*3/uL (ref 0.1–0.9)
MONO%: 15.5 % — ABNORMAL HIGH (ref 0.0–14.0)
NEUT#: 2.2 10*3/uL (ref 1.5–6.5)
NEUT%: 59.4 % (ref 39.0–75.0)
PLATELETS: 112 10*3/uL — AB (ref 140–400)
RBC: 3.39 10*6/uL — ABNORMAL LOW (ref 4.20–5.82)
RDW: 13.5 % (ref 11.0–14.6)
WBC: 3.7 10*3/uL — AB (ref 4.0–10.3)
lymph#: 0.7 10*3/uL — ABNORMAL LOW (ref 0.9–3.3)

## 2014-10-12 LAB — COMPREHENSIVE METABOLIC PANEL (CC13)
ALT: 18 U/L (ref 0–55)
ANION GAP: 6 meq/L (ref 3–11)
AST: 20 U/L (ref 5–34)
Albumin: 3.2 g/dL — ABNORMAL LOW (ref 3.5–5.0)
Alkaline Phosphatase: 49 U/L (ref 40–150)
BILIRUBIN TOTAL: 0.83 mg/dL (ref 0.20–1.20)
BUN: 19.9 mg/dL (ref 7.0–26.0)
CALCIUM: 8.7 mg/dL (ref 8.4–10.4)
CHLORIDE: 108 meq/L (ref 98–109)
CO2: 23 meq/L (ref 22–29)
CREATININE: 1.3 mg/dL (ref 0.7–1.3)
EGFR: 55 mL/min/{1.73_m2} — AB (ref >90)
Glucose: 118 mg/dl (ref 70–140)
Potassium: 3.8 mEq/L (ref 3.5–5.1)
Sodium: 138 mEq/L (ref 136–145)
Total Protein: 5.8 g/dL — ABNORMAL LOW (ref 6.4–8.3)

## 2014-10-12 MED ORDER — HEPARIN SOD (PORK) LOCK FLUSH 100 UNIT/ML IV SOLN
500.0000 [IU] | Freq: Once | INTRAVENOUS | Status: AC
  Administered 2014-10-12: 500 [IU] via INTRAVENOUS
  Filled 2014-10-12: qty 5

## 2014-10-12 MED ORDER — SODIUM CHLORIDE 0.9 % IJ SOLN
10.0000 mL | INTRAMUSCULAR | Status: DC | PRN
  Administered 2014-10-12: 10 mL via INTRAVENOUS
  Filled 2014-10-12: qty 10

## 2014-10-12 NOTE — Assessment & Plan Note (Signed)
This is likely anemia of chronic disease. The patient denies recent history of bleeding such as epistaxis, hematuria or hematochezia. He is asymptomatic from the anemia. We will observe for now.  He does not require transfusion now.  I felt that lowering the dose of his Revlimid would help.

## 2014-10-12 NOTE — Patient Instructions (Signed)

## 2014-10-12 NOTE — Progress Notes (Signed)
[facility] OFFICE PROGRESS NOTE  Patient Care Team: Jinny Sanders, MD as PCP - General  SUMMARY OF ONCOLOGIC HISTORY:  Principle Diagnosis: Kappa light chain multiple myeloma, ISS III with diagnosis established in December 2012.  Prior Therapy:  1. Velcade, Cytoxan, and Decadron on a weekly basis from 07/10/2011 through 10/23/2011; 2. High-dose chemotherapy on 11/23/2011, consisting of carmustine 600 mg IV. He received cytarabine 400 mg IV on 12/24/2011, 12/25/2011, 12/26/2011, and 12/27/2011. He received etoposide 300 mg IV daily from 12/24/2011 through 12/27/2011, four doses. He received melphalan 280 mg IV on 12/28/2011.  3. 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  Current therapy: -Revlimid 5 mg daily, 3 weeks on, 1 week off. Revlimid was started on 05/11/2012.  Zometa was started on 09/02/2012.  Dental clearance was obtained.  Zometa 4 mg was placed on hold due to current osteomyelitis but restarted on 06/23/2013.    He also reports that his PSA is slowly rising and this may require future therapy by his urologist, Dr. Alinda Money.   INTERVAL HISTORY: Please see below for problem oriented charting. If he is well. He continues taking his pain medicine as prescribed and not using any breakthrough pain medicine. Denies new bone pain. No recent infection. REVIEW OF SYSTEMS:   Constitutional: Denies fevers, chills or abnormal weight loss Eyes: Denies blurriness of vision Ears, nose, mouth, throat, and face: Denies mucositis or sore throat Respiratory: Denies cough, dyspnea or wheezes Cardiovascular: Denies palpitation, chest discomfort or lower extremity swelling Gastrointestinal:  Denies nausea, heartburn or change in bowel habits Skin: Denies abnormal skin rashes Lymphatics: Denies new lymphadenopathy or easy bruising Neurological:Denies numbness, tingling or new weaknesses Behavioral/Psych: Mood is stable, no new changes  All other systems were reviewed  with the patient and are negative.  I have reviewed the past medical history, past surgical history, social history and family history with the patient and they are unchanged from previous note.  ALLERGIES:  is allergic to celebrex; cymbalta; meloxicam; percocet; vicodin; and lyrica.  MEDICATIONS:  Current Outpatient Prescriptions  Medication Sig Dispense Refill  . aspirin 81 MG tablet Take 81 mg by mouth daily.    Marland Kitchen b complex vitamins tablet Take 1 tablet by mouth daily.    . calcium carbonate 200 MG capsule Take 750 mg by mouth daily.    . cholecalciferol (VITAMIN D) 1000 UNITS tablet Take 1,000 Units by mouth daily.    . fentaNYL (DURAGESIC - DOSED MCG/HR) 50 MCG/HR Place 1 patch (50 mcg total) onto the skin every 3 (three) days. 10 patch 0  . lenalidomide (REVLIMID) 5 MG capsule Take one capsule by mouth daily for 21 days, then off 7 days. 21 capsule 0  . lidocaine-prilocaine (EMLA) cream Apply topically as needed. Apply to port site one hour before treatment and cover with plastic wrap 30 g 2  . NONFORMULARY OR COMPOUNDED ITEM Baclofn/amitrp/ketamn cream topically to feet TID prn for neuropathy    . oxyCODONE (OXY IR/ROXICODONE) 5 MG immediate release tablet Take 1 tablet (5 mg total) by mouth every 4 (four) hours as needed for severe pain. 60 tablet 0  . pantoprazole (PROTONIX) 40 MG tablet Take 1 tablet (40 mg total) by mouth daily. 90 tablet 3  . prochlorperazine (COMPAZINE) 10 MG tablet Take 10 mg by mouth every 6 (six) hours as needed.    . vitamin C (ASCORBIC ACID) 500 MG tablet Take 1 tablet by mouth daily.     No current facility-administered medications for  this visit.    PHYSICAL EXAMINATION: ECOG PERFORMANCE STATUS: 0 - Asymptomatic  Filed Vitals:   10/12/14 1231  BP: 121/54  Pulse: 68  Temp: 98 F (36.7 C)  Resp: 18   Filed Weights   10/12/14 1231  Weight: 201 lb 3.2 oz (91.264 kg)    GENERAL:alert, no distress and comfortable SKIN: skin color, texture,  turgor are normal, no rashes or significant lesions EYES: normal, Conjunctiva are pink and non-injected, sclera clear OROPHARYNX:no exudate, no erythema and lips, buccal mucosa, and tongue normal  NECK: supple, thyroid normal size, non-tender, without nodularity LYMPH:  no palpable lymphadenopathy in the cervical, axillary or inguinal LUNGS: clear to auscultation and percussion with normal breathing effort HEART: regular rate & rhythm and no murmurs and no lower extremity edema ABDOMEN:abdomen soft, non-tender and normal bowel sounds Musculoskeletal:no cyanosis of digits and no clubbing  NEURO: alert & oriented x 3 with fluent speech, no focal motor/sensory deficits  LABORATORY DATA:  I have reviewed the data as listed    Component Value Date/Time   NA 138 10/12/2014 1140   NA 141 04/12/2014 1041   K 3.8 10/12/2014 1140   K 4.0 04/12/2014 1041   CL 105 04/12/2014 1041   CL 110* 12/29/2012 1151   CO2 23 10/12/2014 1140   CO2 23 04/12/2014 1041   GLUCOSE 118 10/12/2014 1140   GLUCOSE 86 04/12/2014 1041   GLUCOSE 127* 12/29/2012 1151   BUN 19.9 10/12/2014 1140   BUN 26* 04/12/2014 1041   CREATININE 1.3 10/12/2014 1140   CREATININE 1.27 04/12/2014 1041   CALCIUM 8.7 10/12/2014 1140   CALCIUM 9.3 04/12/2014 1041   PROT 5.8* 10/12/2014 1140   PROT 6.0 04/12/2014 1041   ALBUMIN 3.2* 10/12/2014 1140   ALBUMIN 3.4* 04/12/2014 1041   AST 20 10/12/2014 1140   AST 19 04/12/2014 1041   ALT 18 10/12/2014 1140   ALT 19 04/12/2014 1041   ALKPHOS 49 10/12/2014 1140   ALKPHOS 45 04/12/2014 1041   BILITOT 0.83 10/12/2014 1140   BILITOT 0.7 04/12/2014 1041   GFRNONAA 55* 10/13/2011 0817   GFRAA 64* 10/13/2011 0817    No results found for: SPEP, UPEP  Lab Results  Component Value Date   WBC 3.7* 10/12/2014   NEUTROABS 2.2 10/12/2014   HGB 12.3* 10/12/2014   HCT 36.8* 10/12/2014   MCV 108.5* 10/12/2014   PLT 112* 10/12/2014      Chemistry      Component Value Date/Time   NA  138 10/12/2014 1140   NA 141 04/12/2014 1041   K 3.8 10/12/2014 1140   K 4.0 04/12/2014 1041   CL 105 04/12/2014 1041   CL 110* 12/29/2012 1151   CO2 23 10/12/2014 1140   CO2 23 04/12/2014 1041   BUN 19.9 10/12/2014 1140   BUN 26* 04/12/2014 1041   CREATININE 1.3 10/12/2014 1140   CREATININE 1.27 04/12/2014 1041      Component Value Date/Time   CALCIUM 8.7 10/12/2014 1140   CALCIUM 9.3 04/12/2014 1041   ALKPHOS 49 10/12/2014 1140   ALKPHOS 45 04/12/2014 1041   AST 20 10/12/2014 1140   AST 19 04/12/2014 1041   ALT 18 10/12/2014 1140   ALT 19 04/12/2014 1041   BILITOT 0.83 10/12/2014 1140   BILITOT 0.7 04/12/2014 1041     ASSESSMENT & PLAN:  Multiple myeloma We discussed treatment plan.  He was treated with bone marrow transplant but the effect did not last. Since he was placed on  Revlimid, he achieved complete remission. I felt that the dose of Revlimid with is too strong and it is causing significant pancytopenia, despite lowering the dose of Revlimid to 10 mg and recently to 5 mg. He will continue on 81 mg aspirin to prevent DVT. He will visit with dentist twice a year for dental checkup while on Zometa. He will remain on calcium with vitamin D supplement. His blood work today shows stable pancytopenia, and I plan to continue at 5 mg for now, 21 days on 7 days off. He will get Zometa infusion next month.     Anemia of chronic disease This is likely anemia of chronic disease. The patient denies recent history of bleeding such as epistaxis, hematuria or hematochezia. He is asymptomatic from the anemia. We will observe for now.  He does not require transfusion now.  I felt that lowering the dose of his Revlimid would help.     Leukopenia due to antineoplastic chemotherapy This is likely due to recent treatment. The patient denies recent history of fevers, cough, chills, diarrhea or dysuria. He is asymptomatic from the leukopenia. I will observe for now. I will reduce the  dose of Revlimid as above.      Thrombocytopenia due to drugs This is likely due to recent treatment. The patient denies recent history of bleeding such as epistaxis, hematuria or hematochezia. He is asymptomatic from the low platelet count. I will observe for now.  he does not require transfusion now. I will continue the chemotherapy at current dose without dosage adjustment.  If the thrombocytopenia gets progressive worse in the future, I might have to delay his treatment or adjust the chemotherapy dose. Once his current prescription is finished, I will lower the dose of Revlimid.    Lumbar compression fracture He had kyphoplasty. He remained on Zometa. I refilled his prescription fentanyl patch for back pain. He is not taking breakthrough pain medication. I plan to lower the future dose of fentanyl to 50 mcg q 72 hours    No orders of the defined types were placed in this encounter.   All questions were answered. The patient knows to call the clinic with any problems, questions or concerns. No barriers to learning was detected. I spent 25 minutes counseling the patient face to face. The total time spent in the appointment was 30 minutes and more than 50% was on counseling and review of test results     [facility], Daylah Sayavong, MD 10/12/2014 1:50 PM

## 2014-10-12 NOTE — Assessment & Plan Note (Signed)
This is likely due to recent treatment. The patient denies recent history of bleeding such as epistaxis, hematuria or hematochezia. He is asymptomatic from the low platelet count. I will observe for now.  he does not require transfusion now. I will continue the chemotherapy at current dose without dosage adjustment.  If the thrombocytopenia gets progressive worse in the future, I might have to delay his treatment or adjust the chemotherapy dose. Once his current prescription is finished, I will lower the dose of Revlimid.

## 2014-10-12 NOTE — Assessment & Plan Note (Signed)
He had kyphoplasty. He remained on Zometa. I refilled his prescription fentanyl patch for back pain. He is not taking breakthrough pain medication. I plan to lower the future dose of fentanyl to 50 mcg q 72 hours

## 2014-10-12 NOTE — Assessment & Plan Note (Signed)
This is likely due to recent treatment. The patient denies recent history of fevers, cough, chills, diarrhea or dysuria. He is asymptomatic from the leukopenia. I will observe for now. I will reduce the dose of Revlimid as above.

## 2014-10-12 NOTE — Assessment & Plan Note (Signed)
We discussed treatment plan.  He was treated with bone marrow transplant but the effect did not last. Since he was placed on Revlimid, he achieved complete remission. I felt that the dose of Revlimid with is too strong and it is causing significant pancytopenia, despite lowering the dose of Revlimid to 10 mg and recently to 5 mg. He will continue on 81 mg aspirin to prevent DVT. He will visit with dentist twice a year for dental checkup while on Zometa. He will remain on calcium with vitamin D supplement. His blood work today shows stable pancytopenia, and I plan to continue at 5 mg for now, 21 days on 7 days off. He will get Zometa infusion next month.

## 2014-10-12 NOTE — Telephone Encounter (Signed)
gv and printed appt sched and avs for pt for April...sed added tx.

## 2014-10-16 ENCOUNTER — Other Ambulatory Visit: Payer: Self-pay | Admitting: *Deleted

## 2014-10-16 DIAGNOSIS — C9000 Multiple myeloma not having achieved remission: Secondary | ICD-10-CM

## 2014-10-16 MED ORDER — LENALIDOMIDE 5 MG PO CAPS: ORAL_CAPSULE | ORAL | Status: DC

## 2014-10-22 ENCOUNTER — Telehealth: Payer: Self-pay | Admitting: *Deleted

## 2014-10-22 NOTE — Telephone Encounter (Signed)
PT. HAS A HISTORY OF CELLULITIS OF HIS TOES. HIS DERMATOLOGIST,DR.LUPTON, HAS GIVEN PT. ANTIBIOTICS IN THE PAST. DR.LUPTON'S OFFICE IS CLOSED TODAY BUT HE HAS A PRESCRIPTION FOR DOXCYCLINE 100MG  TAKE ONE TWICE A DAY. SHOULD PT. TAKE THIS MEDICATION OR SHOULD HE BE SEEN IN THIS OFFICE TODAY? THIS NOTE ROUTED TO [place].

## 2014-10-22 NOTE — Telephone Encounter (Signed)
NOTIFIED PT. PER [facility] START HIS ANTIBIOTIC TODAY BUT HE NEEDS TO HAVE AN APPOINTMENT WITH HIS PRIMARY CARE PHYSICIAN OR DR.LUPTON. PT. HAS AN APPOINTMENT WITH DR.LUPTON IN A WEEK BUT HE WILL CALL TOMORROW TO SEE IF HE CAN BE SEEN EARLIER.

## 2014-10-22 NOTE — Telephone Encounter (Signed)
He can start antibiotic today but make sure he has an appointment to see PCP or Dr. Allyson Sabal

## 2014-11-06 ENCOUNTER — Other Ambulatory Visit: Payer: Self-pay | Admitting: Physician Assistant

## 2014-11-09 ENCOUNTER — Other Ambulatory Visit: Payer: Self-pay | Admitting: Hematology and Oncology

## 2014-11-09 ENCOUNTER — Telehealth: Payer: Self-pay | Admitting: *Deleted

## 2014-11-09 DIAGNOSIS — C61 Malignant neoplasm of prostate: Secondary | ICD-10-CM

## 2014-11-09 DIAGNOSIS — C9000 Multiple myeloma not having achieved remission: Secondary | ICD-10-CM

## 2014-11-09 DIAGNOSIS — S32000A Wedge compression fracture of unspecified lumbar vertebra, initial encounter for closed fracture: Secondary | ICD-10-CM

## 2014-11-09 DIAGNOSIS — S32000S Wedge compression fracture of unspecified lumbar vertebra, sequela: Secondary | ICD-10-CM

## 2014-11-09 DIAGNOSIS — N08 Glomerular disorders in diseases classified elsewhere: Secondary | ICD-10-CM

## 2014-11-09 MED ORDER — FENTANYL 12 MCG/HR TD PT72: 12.0000 ug | MEDICATED_PATCH | TRANSDERMAL | Status: DC

## 2014-11-09 MED ORDER — FENTANYL 25 MCG/HR TD PT72: 25.0000 ug | MEDICATED_PATCH | TRANSDERMAL | Status: DC

## 2014-11-09 NOTE — Telephone Encounter (Signed)
Pt left VM states needs refill on fentanyl 50 mcg patches.  He will put on his last one this Sunday 4/17.   Dr. Alvy Bimler wants to decrease dose of fentanyl patch and pt to use his oxycodone for Breakthrough pain as needed.   She signed Rx for 25 mcg and 12 mcg to wear together to decrease dose from 50 mcg to 37 mcg.    Informed pt of rxs ready to pick up and instructed on dose decrease, using both patches together and taking his oxycodone ir as needed for any breakthrough pain.  Pt adamantly states he is opposed to the decrease in his patch.  He says "I do not agree with it" and he says he does not want to take any pills.  He only wants to stay on current dose of fentanyl and not use any pills. Attempted to reassure him his pain will be managed on lower dose as long as he takes the oxycodone as needed.   His main complaint seems to be he absolutely doesn't want to have to take any of the pain pills. Tried to explore why he doesn't want to take the pills and he would only offer that he just doesn't like to take pills in general.   He says he will not pick up the Rx until he can speak to Dr. Alvy Bimler about it.   Informed him I will send message to her and call him back on Monday.  She is out of office this afternoon.  He verbalized understanding.

## 2014-11-12 ENCOUNTER — Encounter: Payer: Self-pay | Admitting: Hematology and Oncology

## 2014-11-12 ENCOUNTER — Telehealth: Payer: Self-pay | Admitting: *Deleted

## 2014-11-12 ENCOUNTER — Telehealth: Payer: Self-pay | Admitting: Hematology and Oncology

## 2014-11-12 ENCOUNTER — Ambulatory Visit (HOSPITAL_BASED_OUTPATIENT_CLINIC_OR_DEPARTMENT_OTHER): Payer: Medicare Other | Admitting: Hematology and Oncology

## 2014-11-12 VITALS — BP 149/72 | HR 87 | Temp 97.8°F | Resp 18 | Ht 75.0 in | Wt 198.8 lb

## 2014-11-12 DIAGNOSIS — C9000 Multiple myeloma not having achieved remission: Secondary | ICD-10-CM

## 2014-11-12 DIAGNOSIS — S32000S Wedge compression fracture of unspecified lumbar vertebra, sequela: Secondary | ICD-10-CM

## 2014-11-12 MED ORDER — FENTANYL 50 MCG/HR TD PT72: 50.0000 ug | MEDICATED_PATCH | TRANSDERMAL | Status: DC

## 2014-11-12 NOTE — Telephone Encounter (Signed)
added appt per staff....per staff pt aware

## 2014-11-12 NOTE — Telephone Encounter (Signed)
Pt left VM this morning states he is coming in this morning to pick up Rx for fentanyl but still strongly disagrees w/ Dr. Alvy Bimler about decreasing dose of patch.  Informed him Dr. Alvy Bimler willing to see him today in office to discuss pain management.  Pt verbalized understanding and will come in as soon as he can likely close to noon.   Instructed him to register when he arrives.

## 2014-11-12 NOTE — Assessment & Plan Note (Signed)
We discussed treatment plan.  He was treated with bone marrow transplant but the effect did not last. Since he was placed on Revlimid, he achieved complete remission. I felt that the dose of Revlimid with is too strong and it is causing significant pancytopenia, despite lowering the dose of Revlimid to 10 mg and recently to 5 mg. He will continue on 81 mg aspirin to prevent DVT. He will visit with dentist twice a year for dental checkup while on Zometa. He will remain on calcium with vitamin D supplement.

## 2014-11-12 NOTE — Telephone Encounter (Signed)
No change in plan. Per previous discussion plan is to reduce the patch If he wants to stay on it forever he can get his PCP to refill in the future because my plan is to taper over time

## 2014-11-12 NOTE — Progress Notes (Signed)
[facility] OFFICE PROGRESS NOTE  Patient Care Team: Jinny Sanders, MD as PCP - General  SUMMARY OF ONCOLOGIC HISTORY:  Principle Diagnosis: Kappa light chain multiple myeloma, ISS III with diagnosis established in December 2012.  Prior Therapy:  1. Velcade, Cytoxan, and Decadron on a weekly basis from 07/10/2011 through 10/23/2011; 2. High-dose chemotherapy on 11/23/2011, consisting of carmustine 600 mg IV. He received cytarabine 400 mg IV on 12/24/2011, 12/25/2011, 12/26/2011, and 12/27/2011. He received etoposide 300 mg IV daily from 12/24/2011 through 12/27/2011, four doses. He received melphalan 280 mg IV on 12/28/2011.  3. 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  Current therapy: -Revlimid 5 mg daily, 3 weeks on, 1 week off. Revlimid was started on 05/11/2012.  Zometa was started on 09/02/2012.  Dental clearance was obtained.  Zometa 4 mg was placed on hold due to current osteomyelitis but restarted on 06/23/2013.    He also reports that his PSA is slowly rising and this may require future therapy by his urologist, Dr. Alinda Money.   INTERVAL HISTORY: Please see below for problem oriented charting. He returns for further follow-up. One of the major reason for an urgent appointment today is to discuss pain management. He continues to have peripheral neuropathy and chronic back pain, not taking any breakthrough pain medicine  REVIEW OF SYSTEMS:   Constitutional: Denies fevers, chills or abnormal weight loss Eyes: Denies blurriness of vision Ears, nose, mouth, throat, and face: Denies mucositis or sore throat Respiratory: Denies cough, dyspnea or wheezes Cardiovascular: Denies palpitation, chest discomfort or lower extremity swelling Gastrointestinal:  Denies nausea, heartburn or change in bowel habits Skin: Denies abnormal skin rashes Lymphatics: Denies new lymphadenopathy or easy bruising Neurological:Denies numbness, tingling or new  weaknesses Behavioral/Psych: Mood is stable, no new changes  All other systems were reviewed with the patient and are negative.  I have reviewed the past medical history, past surgical history, social history and family history with the patient and they are unchanged from previous note.  ALLERGIES:  is allergic to celebrex; cymbalta; meloxicam; percocet; vicodin; and lyrica.  MEDICATIONS:  Current Outpatient Prescriptions  Medication Sig Dispense Refill  . aspirin 81 MG tablet Take 81 mg by mouth daily.    Marland Kitchen b complex vitamins tablet Take 1 tablet by mouth daily.    . calcium carbonate 200 MG capsule Take 750 mg by mouth daily.    . cholecalciferol (VITAMIN D) 1000 UNITS tablet Take 1,000 Units by mouth daily.    . fentaNYL (DURAGESIC - DOSED MCG/HR) 25 MCG/HR patch Place 1 patch (25 mcg total) onto the skin every 3 (three) days. 5 patch 0  . fentaNYL (DURAGESIC - DOSED MCG/HR) 50 MCG/HR Place 1 patch (50 mcg total) onto the skin every 3 (three) days. 10 patch 0  . lenalidomide (REVLIMID) 5 MG capsule Take one capsule by mouth daily for 21 days, then off 7 days. 21 capsule 0  . lidocaine-prilocaine (EMLA) cream Apply topically as needed. Apply to port site one hour before treatment and cover with plastic wrap 30 g 2  . NONFORMULARY OR COMPOUNDED ITEM Baclofn/amitrp/ketamn cream topically to feet TID prn for neuropathy    . oxyCODONE (OXY IR/ROXICODONE) 5 MG immediate release tablet Take 1 tablet (5 mg total) by mouth every 4 (four) hours as needed for severe pain. 60 tablet 0  . pantoprazole (PROTONIX) 40 MG tablet Take 1 tablet (40 mg total) by mouth daily. 90 tablet 3  . prochlorperazine (COMPAZINE) 10 MG tablet  Take 10 mg by mouth every 6 (six) hours as needed.    . vitamin C (ASCORBIC ACID) 500 MG tablet Take 1 tablet by mouth daily.     No current facility-administered medications for this visit.    PHYSICAL EXAMINATION: ECOG PERFORMANCE STATUS: 0 - Asymptomatic  Filed Vitals:    11/12/14 1216  BP: 149/72  Pulse: 87  Temp: 97.8 F (36.6 C)  Resp: 18   Filed Weights   11/12/14 1216  Weight: 198 lb 12.8 oz (90.175 kg)    GENERAL:alert, no distress and comfortable SKIN: skin color, texture, turgor are normal, no rashes or significant lesions EYES: normal, Conjunctiva are pink and non-injected, sclera clear Musculoskeletal:no cyanosis of digits and no clubbing  NEURO: alert & oriented x 3 with fluent speech, no focal motor/sensory deficits  LABORATORY DATA:  I have reviewed the data as listed    Component Value Date/Time   NA 138 10/12/2014 1140   NA 141 04/12/2014 1041   K 3.8 10/12/2014 1140   K 4.0 04/12/2014 1041   CL 105 04/12/2014 1041   CL 110* 12/29/2012 1151   CO2 23 10/12/2014 1140   CO2 23 04/12/2014 1041   GLUCOSE 118 10/12/2014 1140   GLUCOSE 86 04/12/2014 1041   GLUCOSE 127* 12/29/2012 1151   BUN 19.9 10/12/2014 1140   BUN 26* 04/12/2014 1041   CREATININE 1.3 10/12/2014 1140   CREATININE 1.27 04/12/2014 1041   CALCIUM 8.7 10/12/2014 1140   CALCIUM 9.3 04/12/2014 1041   PROT 5.8* 10/12/2014 1140   PROT 6.0 04/12/2014 1041   ALBUMIN 3.2* 10/12/2014 1140   ALBUMIN 3.4* 04/12/2014 1041   AST 20 10/12/2014 1140   AST 19 04/12/2014 1041   ALT 18 10/12/2014 1140   ALT 19 04/12/2014 1041   ALKPHOS 49 10/12/2014 1140   ALKPHOS 45 04/12/2014 1041   BILITOT 0.83 10/12/2014 1140   BILITOT 0.7 04/12/2014 1041   GFRNONAA 55* 10/13/2011 0817   GFRAA 64* 10/13/2011 0817    No results found for: SPEP, UPEP  Lab Results  Component Value Date   WBC 3.7* 10/12/2014   NEUTROABS 2.2 10/12/2014   HGB 12.3* 10/12/2014   HCT 36.8* 10/12/2014   MCV 108.5* 10/12/2014   PLT 112* 10/12/2014      Chemistry      Component Value Date/Time   NA 138 10/12/2014 1140   NA 141 04/12/2014 1041   K 3.8 10/12/2014 1140   K 4.0 04/12/2014 1041   CL 105 04/12/2014 1041   CL 110* 12/29/2012 1151   CO2 23 10/12/2014 1140   CO2 23 04/12/2014 1041    BUN 19.9 10/12/2014 1140   BUN 26* 04/12/2014 1041   CREATININE 1.3 10/12/2014 1140   CREATININE 1.27 04/12/2014 1041      Component Value Date/Time   CALCIUM 8.7 10/12/2014 1140   CALCIUM 9.3 04/12/2014 1041   ALKPHOS 49 10/12/2014 1140   ALKPHOS 45 04/12/2014 1041   AST 20 10/12/2014 1140   AST 19 04/12/2014 1041   ALT 18 10/12/2014 1140   ALT 19 04/12/2014 1041   BILITOT 0.83 10/12/2014 1140   BILITOT 0.7 04/12/2014 1041     ASSESSMENT & PLAN:  Multiple myeloma We discussed treatment plan.  He was treated with bone marrow transplant but the effect did not last. Since he was placed on Revlimid, he achieved complete remission. I felt that the dose of Revlimid with is too strong and it is causing significant pancytopenia, despite lowering  the dose of Revlimid to 10 mg and recently to 5 mg. He will continue on 81 mg aspirin to prevent DVT. He will visit with dentist twice a year for dental checkup while on Zometa. He will remain on calcium with vitamin D supplement.   Lumbar compression fracture He had kyphoplasty. He remained on Zometa. I plan to lower the future dose of fentanyl to 37 mcg q 72 hours We have a long discussion today. The patient does not want to take any form of breakthrough pain medicine. He is comfortable to remain on 50 g fentanyl patch for the rest of his life. I told him the rationale of lowering down the prescription but the patient is not willing to go along with the plan. He would like to go back to [facility] pain management clinic for further treatment recommendation. I encouraged him to do so. I refilled one more time prescription fentanyl pill 10 patches, and 50 g today and the recommend he follow with another provider for chronic pain medication refill in the future.    No orders of the defined types were placed in this encounter.   All questions were answered. The patient knows to call the clinic with any problems, questions or concerns. No  barriers to learning was detected. I spent 15 minutes counseling the patient face to face. The total time spent in the appointment was 20 minutes and more than 50% was on counseling and review of test results     [facility], [place], MD 11/12/2014 1:10 PM

## 2014-11-12 NOTE — Assessment & Plan Note (Signed)
He had kyphoplasty. He remained on Zometa. I plan to lower the future dose of fentanyl to 37 mcg q 72 hours We have a long discussion today. The patient does not want to take any form of breakthrough pain medicine. He is comfortable to remain on 50 g fentanyl patch for the rest of his life. I told him the rationale of lowering down the prescription but the patient is not willing to go along with the plan. He would like to go back to [facility] pain management clinic for further treatment recommendation. I encouraged him to do so. I refilled one more time prescription fentanyl pill 10 patches, and 50 g today and the recommend he follow with another provider for chronic pain medication refill in the future.

## 2014-11-13 ENCOUNTER — Other Ambulatory Visit: Payer: Self-pay | Admitting: *Deleted

## 2014-11-13 DIAGNOSIS — C9000 Multiple myeloma not having achieved remission: Secondary | ICD-10-CM

## 2014-11-13 MED ORDER — LENALIDOMIDE 5 MG PO CAPS: ORAL_CAPSULE | ORAL | Status: DC

## 2014-11-23 ENCOUNTER — Other Ambulatory Visit: Payer: Medicare Other

## 2014-11-23 ENCOUNTER — Ambulatory Visit: Payer: Medicare Other

## 2014-11-23 ENCOUNTER — Ambulatory Visit: Payer: Medicare Other | Admitting: Hematology and Oncology

## 2014-11-28 ENCOUNTER — Encounter: Payer: Self-pay | Admitting: Podiatry

## 2014-11-28 ENCOUNTER — Ambulatory Visit (INDEPENDENT_AMBULATORY_CARE_PROVIDER_SITE_OTHER): Payer: Medicare Other | Admitting: Podiatry

## 2014-11-28 VITALS — Ht 74.0 in | Wt 200.0 lb

## 2014-11-28 DIAGNOSIS — L89891 Pressure ulcer of other site, stage 1: Secondary | ICD-10-CM

## 2014-11-28 DIAGNOSIS — L03119 Cellulitis of unspecified part of limb: Secondary | ICD-10-CM | POA: Diagnosis not present

## 2014-11-28 DIAGNOSIS — L02619 Cutaneous abscess of unspecified foot: Secondary | ICD-10-CM

## 2014-11-28 DIAGNOSIS — M79675 Pain in left toe(s): Secondary | ICD-10-CM | POA: Diagnosis not present

## 2014-11-28 MED ORDER — MUPIROCIN 2 % EX OINT: TOPICAL_OINTMENT | CUTANEOUS | Status: DC

## 2014-11-28 MED ORDER — CLINDAMYCIN HCL 150 MG PO CAPS: 150.0000 mg | ORAL_CAPSULE | Freq: Three times a day (TID) | ORAL | Status: DC

## 2014-11-28 NOTE — Progress Notes (Signed)
He presents today with a chief complaint of painful second digit of the left foot. He states his them bother him for quite some time now. He relates the skin breakdown to multiple myeloma.  Objective: Vital signs are stable alert and oriented 3. Pulses are palpable left foot. Hallux valgus deformity with a juxtaposition of the hallux rubbing the medial aspect of the second digit resulting in skin breakdown and cellulitis. Superficial ulceration overlying the medial aspect of the PIPJ  Assessment: Cellulitis ulceration medial aspect second digit left foot.  Plan: Started him on Epsom salts and warm water soaks apply Bactroban ointment after soaking. I also started him on clindamycin and I debrided the wound today we also placed padding between the toes to help prevent irritation. I'll follow-up with him in 2-3 weeks. He will watch for signs of worsening infection and notify us if there are any. 

## 2014-12-07 ENCOUNTER — Telehealth: Payer: Self-pay | Admitting: *Deleted

## 2014-12-07 ENCOUNTER — Telehealth: Payer: Self-pay | Admitting: Hematology and Oncology

## 2014-12-07 ENCOUNTER — Ambulatory Visit (HOSPITAL_BASED_OUTPATIENT_CLINIC_OR_DEPARTMENT_OTHER): Payer: Medicare Other

## 2014-12-07 ENCOUNTER — Ambulatory Visit (HOSPITAL_BASED_OUTPATIENT_CLINIC_OR_DEPARTMENT_OTHER): Payer: Medicare Other | Admitting: Hematology and Oncology

## 2014-12-07 ENCOUNTER — Encounter: Payer: Self-pay | Admitting: Hematology and Oncology

## 2014-12-07 ENCOUNTER — Encounter (HOSPITAL_BASED_OUTPATIENT_CLINIC_OR_DEPARTMENT_OTHER): Payer: Medicare Other | Admitting: Hematology and Oncology

## 2014-12-07 ENCOUNTER — Ambulatory Visit: Payer: Medicare Other

## 2014-12-07 VITALS — BP 139/61 | HR 64 | Resp 18 | Ht 74.0 in | Wt 197.0 lb

## 2014-12-07 DIAGNOSIS — T50905A Adverse effect of unspecified drugs, medicaments and biological substances, initial encounter: Secondary | ICD-10-CM

## 2014-12-07 DIAGNOSIS — T451X5A Adverse effect of antineoplastic and immunosuppressive drugs, initial encounter: Secondary | ICD-10-CM

## 2014-12-07 DIAGNOSIS — Z95828 Presence of other vascular implants and grafts: Secondary | ICD-10-CM

## 2014-12-07 DIAGNOSIS — D6959 Other secondary thrombocytopenia: Secondary | ICD-10-CM

## 2014-12-07 DIAGNOSIS — S32000S Wedge compression fracture of unspecified lumbar vertebra, sequela: Secondary | ICD-10-CM

## 2014-12-07 DIAGNOSIS — C9000 Multiple myeloma not having achieved remission: Secondary | ICD-10-CM

## 2014-12-07 DIAGNOSIS — M4856XA Collapsed vertebra, not elsewhere classified, lumbar region, initial encounter for fracture: Secondary | ICD-10-CM

## 2014-12-07 DIAGNOSIS — D701 Agranulocytosis secondary to cancer chemotherapy: Secondary | ICD-10-CM

## 2014-12-07 LAB — COMPREHENSIVE METABOLIC PANEL (CC13)
ALT: 25 U/L (ref 0–55)
ANION GAP: 7 meq/L (ref 3–11)
AST: 26 U/L (ref 5–34)
Albumin: 3.3 g/dL — ABNORMAL LOW (ref 3.5–5.0)
Alkaline Phosphatase: 58 U/L (ref 40–150)
BILIRUBIN TOTAL: 0.74 mg/dL (ref 0.20–1.20)
BUN: 18.6 mg/dL (ref 7.0–26.0)
CHLORIDE: 108 meq/L (ref 98–109)
CO2: 25 meq/L (ref 22–29)
Calcium: 8.4 mg/dL (ref 8.4–10.4)
Creatinine: 1.3 mg/dL (ref 0.7–1.3)
EGFR: 54 mL/min/{1.73_m2} — AB (ref >90)
Glucose: 103 mg/dl (ref 70–140)
Potassium: 4.1 mEq/L (ref 3.5–5.1)
SODIUM: 140 meq/L (ref 136–145)
TOTAL PROTEIN: 5.8 g/dL — AB (ref 6.4–8.3)

## 2014-12-07 LAB — CBC WITH DIFFERENTIAL/PLATELET
BASO%: 1 % (ref 0.0–2.0)
Basophils Absolute: 0 10*3/uL (ref 0.0–0.1)
EOS ABS: 0.5 10*3/uL (ref 0.0–0.5)
EOS%: 12.7 % — AB (ref 0.0–7.0)
HEMATOCRIT: 39.7 % (ref 38.4–49.9)
HEMOGLOBIN: 13.9 g/dL (ref 13.0–17.1)
LYMPH#: 0.8 10*3/uL — AB (ref 0.9–3.3)
LYMPH%: 20.7 % (ref 14.0–49.0)
MCH: 35.5 pg — ABNORMAL HIGH (ref 27.2–33.4)
MCHC: 35 g/dL (ref 32.0–36.0)
MCV: 101.5 fL — AB (ref 79.3–98.0)
MONO#: 0.7 10*3/uL (ref 0.1–0.9)
MONO%: 19.1 % — ABNORMAL HIGH (ref 0.0–14.0)
NEUT#: 1.8 10*3/uL (ref 1.5–6.5)
NEUT%: 46.5 % (ref 39.0–75.0)
Platelets: 85 10*3/uL — ABNORMAL LOW (ref 140–400)
RBC: 3.91 10*6/uL — AB (ref 4.20–5.82)
RDW: 13.5 % (ref 11.0–14.6)
WBC: 3.9 10*3/uL — AB (ref 4.0–10.3)

## 2014-12-07 MED ORDER — SODIUM CHLORIDE 0.9 % IJ SOLN
10.0000 mL | INTRAMUSCULAR | Status: DC | PRN
  Administered 2014-12-07: 10 mL via INTRAVENOUS
  Filled 2014-12-07: qty 10

## 2014-12-07 MED ORDER — HEPARIN SOD (PORK) LOCK FLUSH 100 UNIT/ML IV SOLN
500.0000 [IU] | Freq: Once | INTRAVENOUS | Status: AC
  Administered 2014-12-07: 500 [IU] via INTRAVENOUS
  Filled 2014-12-07: qty 5

## 2014-12-07 MED ORDER — SODIUM CHLORIDE 0.9 % IV SOLN
Freq: Once | INTRAVENOUS | Status: AC
  Administered 2014-12-07: 13:00:00 via INTRAVENOUS

## 2014-12-07 MED ORDER — ZOLEDRONIC ACID 4 MG/100ML IV SOLN
4.0000 mg | Freq: Once | INTRAVENOUS | Status: AC
  Administered 2014-12-07: 4 mg via INTRAVENOUS
  Filled 2014-12-07: qty 100

## 2014-12-07 NOTE — Telephone Encounter (Signed)
Per staff message and POF I have scheduled appts. Advised scheduler of appts. JMW  

## 2014-12-07 NOTE — Telephone Encounter (Signed)
Pt confirmed labs/ov per 05/13 POF, gave pt AVS and Calendar.... KJ, sent msg to add Zometa

## 2014-12-07 NOTE — Patient Instructions (Signed)

## 2014-12-07 NOTE — Patient Instructions (Signed)

## 2014-12-08 NOTE — Assessment & Plan Note (Signed)
He had kyphoplasty. He remained on Zometa. I plan to lower the future dose of fentanyl to 37 mcg q 72 hours We have a long discussion today. The patient does not want to take any form of breakthrough pain medicine. He is comfortable to remain on 50 g fentanyl patch for the rest of his life. I told him the rationale of lowering down the prescription but the patient is not willing to go along with the plan. He ultimately went back to [facility] pain management clinic for chronic pain management

## 2014-12-08 NOTE — Progress Notes (Signed)
[place] OFFICE PROGRESS NOTE  Patient Care Team: Jinny Sanders, MD as PCP - General  SUMMARY OF ONCOLOGIC HISTORY:  Principle Diagnosis: Kappa light chain multiple myeloma, ISS III with diagnosis established in December 2012.  Prior Therapy:  1. Velcade, Cytoxan, and Decadron on a weekly basis from 07/10/2011 through 10/23/2011; 2. High-dose chemotherapy on 11/23/2011, consisting of carmustine 600 mg IV. He received cytarabine 400 mg IV on 12/24/2011, 12/25/2011, 12/26/2011, and 12/27/2011. He received etoposide 300 mg IV daily from 12/24/2011 through 12/27/2011, four doses. He received melphalan 280 mg IV on 12/28/2011.  3. 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  Current therapy: -Revlimid 5 mg daily, 3 weeks on, 1 week off. Revlimid was started on 05/11/2012.  Zometa was started on 09/02/2012.  Dental clearance was obtained.  Zometa 4 mg was placed on hold due to current osteomyelitis but restarted on 06/23/2013.    He also reports that his PSA is slowly rising and this may require future therapy by his urologist, Dr. Alinda Money.   INTERVAL HISTORY: Please see below for problem oriented charting. He returns for further follow-up. 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  REVIEW OF SYSTEMS:   Constitutional: Denies fevers, chills or abnormal weight loss Eyes: Denies blurriness of vision Ears, nose, mouth, throat, and face: Denies mucositis or sore throat Respiratory: Denies cough, dyspnea or wheezes Cardiovascular: Denies palpitation, chest discomfort or lower extremity swelling Gastrointestinal:  Denies nausea, heartburn or change in bowel habits Skin: Denies abnormal skin rashes Lymphatics: Denies new lymphadenopathy or easy  bruising Neurological:Denies numbness, tingling or new weaknesses Behavioral/Psych: Mood is stable, no new changes  All other systems were reviewed with the patient and are negative.  I have reviewed the past medical history, past surgical history, social history and family history with the patient and they are unchanged from previous note.  ALLERGIES:  is allergic to celebrex; cymbalta; meloxicam; percocet; vicodin; and lyrica.  MEDICATIONS:  Current Outpatient Prescriptions  Medication Sig Dispense Refill  . aspirin 81 MG tablet Take 81 mg by mouth daily.    Marland Kitchen b complex vitamins tablet Take 1 tablet by mouth daily.    . calcium carbonate 200 MG capsule Take 750 mg by mouth daily.    . cholecalciferol (VITAMIN D) 1000 UNITS tablet Take 1,000 Units by mouth daily.    . clindamycin (CLEOCIN) 150 MG capsule Take 1 capsule (150 mg total) by mouth 3 (three) times daily. 30 capsule 1  . fentaNYL (DURAGESIC - DOSED MCG/HR) 50 MCG/HR Place 1 patch (50 mcg total) onto the skin every 3 (three) days. 10 patch 0  . lenalidomide (REVLIMID) 5 MG capsule Take one capsule by mouth daily for 21 days, then off 7 days. 21 capsule 0  . lidocaine-prilocaine (EMLA) cream Apply topically as needed. Apply to port site one hour before treatment and cover with plastic wrap 30 g 2  . mupirocin ointment (BACTROBAN) 2 % Apply to wound after soaking BID 30 g 1  . NONFORMULARY OR COMPOUNDED ITEM Baclofn/amitrp/ketamn cream topically to feet TID prn for neuropathy    . oxyCODONE (OXY IR/ROXICODONE) 5 MG immediate release tablet Take 1 tablet (5 mg total) by mouth every 4 (four) hours as needed for severe pain. 60 tablet 0  . pantoprazole (PROTONIX) 40 MG tablet Take 1  tablet (40 mg total) by mouth daily. 90 tablet 3  . prochlorperazine (COMPAZINE) 10 MG tablet Take 10 mg by mouth every 6 (six) hours as needed.    . vitamin C (ASCORBIC ACID) 500 MG tablet Take 1 tablet by mouth daily.     No current facility-administered  medications for this visit.    PHYSICAL EXAMINATION: ECOG PERFORMANCE STATUS: 1 - Symptomatic but completely ambulatory  Filed Vitals:   12/07/14 1207  BP: 139/61  Pulse: 64  Resp: 18   Filed Weights   12/07/14 1207  Weight: 197 lb (89.359 kg)    GENERAL:alert, no distress and comfortable SKIN: skin color, texture, turgor are normal, no rashes or significant lesions EYES: normal, Conjunctiva are pink and non-injected, sclera clear OROPHARYNX:no exudate, no erythema and lips, buccal mucosa, and tongue normal  NECK: supple, thyroid normal size, non-tender, without nodularity LYMPH:  no palpable lymphadenopathy in the cervical, axillary or inguinal LUNGS: clear to auscultation and percussion with normal breathing effort HEART: regular rate & rhythm and no murmurs and no lower extremity edema ABDOMEN:abdomen soft, non-tender and normal bowel sounds Musculoskeletal:no cyanosis of digits and no clubbing  NEURO: alert & oriented x 3 with fluent speech, no focal motor/sensory deficits  LABORATORY DATA:  I have reviewed the data as listed    Component Value Date/Time   NA 140 12/07/2014 1147   NA 141 04/12/2014 1041   NA 138 09/30/2013 0945   K 4.1 12/07/2014 1147   K 4.0 04/12/2014 1041   K 4.3 09/30/2013 0945   CL 105 04/12/2014 1041   CL 108* 09/30/2013 0945   CL 110* 12/29/2012 1151   CO2 25 12/07/2014 1147   CO2 23 04/12/2014 1041   CO2 26 09/30/2013 0945   GLUCOSE 103 12/07/2014 1147   GLUCOSE 86 04/12/2014 1041   GLUCOSE 92 09/30/2013 0945   GLUCOSE 127* 12/29/2012 1151   BUN 18.6 12/07/2014 1147   BUN 26* 04/12/2014 1041   BUN 25* 09/30/2013 0945   CREATININE 1.3 12/07/2014 1147   CREATININE 1.27 04/12/2014 1041   CREATININE 1.16 09/30/2013 0945   CALCIUM 8.4 12/07/2014 1147   CALCIUM 9.3 04/12/2014 1041   CALCIUM 8.3* 09/30/2013 0945   PROT 5.8* 12/07/2014 1147   PROT 6.0 04/12/2014 1041   ALBUMIN 3.3* 12/07/2014 1147   ALBUMIN 3.4* 04/12/2014 1041   AST  26 12/07/2014 1147   AST 19 04/12/2014 1041   ALT 25 12/07/2014 1147   ALT 19 04/12/2014 1041   ALKPHOS 58 12/07/2014 1147   ALKPHOS 45 04/12/2014 1041   BILITOT 0.74 12/07/2014 1147   BILITOT 0.7 04/12/2014 1041   GFRNONAA >60 09/30/2013 0945   GFRNONAA 55* 10/13/2011 0817   GFRAA >60 09/30/2013 0945   GFRAA 64* 10/13/2011 0817    No results found for: SPEP, UPEP  Lab Results  Component Value Date   WBC 3.9* 12/07/2014   NEUTROABS 1.8 12/07/2014   HGB 13.9 12/07/2014   HCT 39.7 12/07/2014   MCV 101.5* 12/07/2014   PLT 85* 12/07/2014      Chemistry      Component Value Date/Time   NA 140 12/07/2014 1147   NA 141 04/12/2014 1041   NA 138 09/30/2013 0945   K 4.1 12/07/2014 1147   K 4.0 04/12/2014 1041   K 4.3 09/30/2013 0945   CL 105 04/12/2014 1041   CL 108* 09/30/2013 0945   CL 110* 12/29/2012 1151   CO2 25 12/07/2014 1147   CO2 23  04/12/2014 1041   CO2 26 09/30/2013 0945   BUN 18.6 12/07/2014 1147   BUN 26* 04/12/2014 1041   BUN 25* 09/30/2013 0945   CREATININE 1.3 12/07/2014 1147   CREATININE 1.27 04/12/2014 1041   CREATININE 1.16 09/30/2013 0945      Component Value Date/Time   CALCIUM 8.4 12/07/2014 1147   CALCIUM 9.3 04/12/2014 1041   CALCIUM 8.3* 09/30/2013 0945   ALKPHOS 58 12/07/2014 1147   ALKPHOS 45 04/12/2014 1041   AST 26 12/07/2014 1147   AST 19 04/12/2014 1041   ALT 25 12/07/2014 1147   ALT 19 04/12/2014 1041   BILITOT 0.74 12/07/2014 1147   BILITOT 0.7 04/12/2014 1041      ASSESSMENT & PLAN:  Multiple myeloma We discussed treatment plan.  He was treated with bone marrow transplant but the effect did not last. Since he was placed on Revlimid, he achieved complete remission. I felt that the dose of Revlimid with is too strong and it is causing significant pancytopenia, despite lowering the dose of Revlimid to 10 mg and recently to 5 mg. He will continue on 81 mg aspirin to prevent DVT. He will visit with dentist twice a year for dental  checkup while on Zometa. He will remain on calcium with vitamin D supplement.     Leukopenia due to antineoplastic chemotherapy This is likely due to recent treatment. The patient denies recent history of fevers, cough, chills, diarrhea or dysuria. He is asymptomatic from the leukopenia. I will observe for now.        Lumbar compression fracture He had kyphoplasty. He remained on Zometa. I plan to lower the future dose of fentanyl to 37 mcg q 72 hours We have a long discussion today. The patient does not want to take any form of breakthrough pain medicine. He is comfortable to remain on 50 g fentanyl patch for the rest of his life. I told him the rationale of lowering down the prescription but the patient is not willing to go along with the plan. He ultimately went back to [facility] pain management clinic for chronic pain management   Thrombocytopenia due to drugs This is likely due to recent treatment. The patient denies recent history of bleeding such as epistaxis, hematuria or hematochezia. He is asymptomatic from the low platelet count. I will observe for now.  he does not require transfusion now. I will continue the chemotherapy at current dose without dosage adjustment.  If the thrombocytopenia gets progressive worse in the future, I might have to delay his treatment or adjust the chemotherapy dose.      Orders Placed This Encounter  Procedures  . Lactate dehydrogenase    Standing Status: Future     Number of Occurrences:      Standing Expiration Date: 01/11/2016  . SPEP & IFE with QIG    Standing Status: Future     Number of Occurrences:      Standing Expiration Date: 01/11/2016  . Kappa/lambda light chains    Standing Status: Future     Number of Occurrences:      Standing Expiration Date: 01/11/2016  . Beta 2 microglobulin, serum    Standing Status: Future     Number of Occurrences:      Standing Expiration Date: 01/11/2016   All questions were answered. The patient  knows to call the clinic with any problems, questions or concerns. No barriers to learning was detected. I spent 25 minutes counseling the patient face to face. The  total time spent in the appointment was 30 minutes and more than 50% was on counseling and review of test results     [facility], [place], MD 12/08/2014 9:25 PM

## 2014-12-08 NOTE — Assessment & Plan Note (Signed)
This is likely due to recent treatment. The patient denies recent history of bleeding such as epistaxis, hematuria or hematochezia. He is asymptomatic from the low platelet count. I will observe for now.  he does not require transfusion now. I will continue the chemotherapy at current dose without dosage adjustment.  If the thrombocytopenia gets progressive worse in the future, I might have to delay his treatment or adjust the chemotherapy dose.   

## 2014-12-08 NOTE — Assessment & Plan Note (Signed)
We discussed treatment plan.  He was treated with bone marrow transplant but the effect did not last. Since he was placed on Revlimid, he achieved complete remission. I felt that the dose of Revlimid with is too strong and it is causing significant pancytopenia, despite lowering the dose of Revlimid to 10 mg and recently to 5 mg. He will continue on 81 mg aspirin to prevent DVT. He will visit with dentist twice a year for dental checkup while on Zometa. He will remain on calcium with vitamin D supplement. 

## 2014-12-08 NOTE — Progress Notes (Signed)
[place] OFFICE PROGRESS NOTE  Patient Care Team: Jinny Sanders, MD as PCP - General  SUMMARY OF ONCOLOGIC HISTORY:  Principle Diagnosis: Kappa light chain multiple myeloma, ISS III with diagnosis established in December 2012.  Prior Therapy:  1. Velcade, Cytoxan, and Decadron on a weekly basis from 07/10/2011 through 10/23/2011; 2. High-dose chemotherapy on 11/23/2011, consisting of carmustine 600 mg IV. He received cytarabine 400 mg IV on 12/24/2011, 12/25/2011, 12/26/2011, and 12/27/2011. He received etoposide 300 mg IV daily from 12/24/2011 through 12/27/2011, four doses. He received melphalan 280 mg IV on 12/28/2011.  3. 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  Current therapy: -Revlimid 5 mg daily, 3 weeks on, 1 week off. Revlimid was started on 05/11/2012.  Zometa was started on 09/02/2012.  Dental clearance was obtained.  Zometa 4 mg was placed on hold due to current osteomyelitis but restarted on 06/23/2013.    He also reports that his PSA is slowly rising and this may require future therapy by his urologist, Dr. Alinda Money.   INTERVAL HISTORY: Please see below for problem oriented charting. He returns for further follow-up. 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   REVIEW OF SYSTEMS:   Constitutional: Denies fevers, chills or abnormal weight loss Eyes: Denies blurriness of vision Ears, nose, mouth, throat, and face: Denies mucositis or sore throat Respiratory: Denies cough, dyspnea or wheezes Cardiovascular: Denies palpitation, chest discomfort or lower extremity swelling Gastrointestinal:  Denies nausea, heartburn or change in bowel habits Skin: Denies abnormal skin rashes Lymphatics: Denies new lymphadenopathy or easy  bruising Neurological:Denies numbness, tingling or new weaknesses Behavioral/Psych: Mood is stable, no new changes  All other systems were reviewed with the patient and are negative.  I have reviewed the past medical history, past surgical history, social history and family history with the patient and they are unchanged from previous note.  ALLERGIES:  is allergic to celebrex; cymbalta; meloxicam; percocet; vicodin; and lyrica.  MEDICATIONS:  Current Outpatient Prescriptions  Medication Sig Dispense Refill  . aspirin 81 MG tablet Take 81 mg by mouth daily.    Marland Kitchen b complex vitamins tablet Take 1 tablet by mouth daily.    . calcium carbonate 200 MG capsule Take 750 mg by mouth daily.    . cholecalciferol (VITAMIN D) 1000 UNITS tablet Take 1,000 Units by mouth daily.    . clindamycin (CLEOCIN) 150 MG capsule Take 1 capsule (150 mg total) by mouth 3 (three) times daily. 30 capsule 1  . fentaNYL (DURAGESIC - DOSED MCG/HR) 50 MCG/HR Place 1 patch (50 mcg total) onto the skin every 3 (three) days. 10 patch 0  . lenalidomide (REVLIMID) 5 MG capsule Take one capsule by mouth daily for 21 days, then off 7 days. 21 capsule 0  . lidocaine-prilocaine (EMLA) cream Apply topically as needed. Apply to port site one hour before treatment and cover with plastic wrap 30 g 2  . mupirocin ointment (BACTROBAN) 2 % Apply to wound after soaking BID 30 g 1  . NONFORMULARY OR COMPOUNDED ITEM Baclofn/amitrp/ketamn cream topically to feet TID prn for neuropathy    . oxyCODONE (OXY IR/ROXICODONE) 5 MG immediate release tablet Take 1 tablet (5 mg total) by mouth every 4 (four) hours as needed for severe pain. 60 tablet 0  . pantoprazole (PROTONIX) 40 MG tablet Take  1 tablet (40 mg total) by mouth daily. 90 tablet 3  . prochlorperazine (COMPAZINE) 10 MG tablet Take 10 mg by mouth every 6 (six) hours as needed.    . vitamin C (ASCORBIC ACID) 500 MG tablet Take 1 tablet by mouth daily.     No current facility-administered  medications for this visit.    PHYSICAL EXAMINATION: ECOG PERFORMANCE STATUS: 0 - Asymptomatic  There were no vitals filed for this visit. There were no vitals filed for this visit.  GENERAL:alert, no distress and comfortable SKIN: skin color, texture, turgor are normal, no rashes or significant lesions EYES: normal, Conjunctiva are pink and non-injected, sclera clear OROPHARYNX:no exudate, no erythema and lips, buccal mucosa, and tongue normal  NECK: supple, thyroid normal size, non-tender, without nodularity LYMPH:  no palpable lymphadenopathy in the cervical, axillary or inguinal LUNGS: clear to auscultation and percussion with normal breathing effort HEART: regular rate & rhythm and no murmurs and no lower extremity edema ABDOMEN:abdomen soft, non-tender and normal bowel sounds Musculoskeletal:no cyanosis of digits and no clubbing  NEURO: alert & oriented x 3 with fluent speech, no focal motor/sensory deficits  LABORATORY DATA:  I have reviewed the data as listed    Component Value Date/Time   NA 140 12/07/2014 1147   NA 141 04/12/2014 1041   NA 138 09/30/2013 0945   K 4.1 12/07/2014 1147   K 4.0 04/12/2014 1041   K 4.3 09/30/2013 0945   CL 105 04/12/2014 1041   CL 108* 09/30/2013 0945   CL 110* 12/29/2012 1151   CO2 25 12/07/2014 1147   CO2 23 04/12/2014 1041   CO2 26 09/30/2013 0945   GLUCOSE 103 12/07/2014 1147   GLUCOSE 86 04/12/2014 1041   GLUCOSE 92 09/30/2013 0945   GLUCOSE 127* 12/29/2012 1151   BUN 18.6 12/07/2014 1147   BUN 26* 04/12/2014 1041   BUN 25* 09/30/2013 0945   CREATININE 1.3 12/07/2014 1147   CREATININE 1.27 04/12/2014 1041   CREATININE 1.16 09/30/2013 0945   CALCIUM 8.4 12/07/2014 1147   CALCIUM 9.3 04/12/2014 1041   CALCIUM 8.3* 09/30/2013 0945   PROT 5.8* 12/07/2014 1147   PROT 6.0 04/12/2014 1041   ALBUMIN 3.3* 12/07/2014 1147   ALBUMIN 3.4* 04/12/2014 1041   AST 26 12/07/2014 1147   AST 19 04/12/2014 1041   ALT 25 12/07/2014 1147    ALT 19 04/12/2014 1041   ALKPHOS 58 12/07/2014 1147   ALKPHOS 45 04/12/2014 1041   BILITOT 0.74 12/07/2014 1147   BILITOT 0.7 04/12/2014 1041   GFRNONAA >60 09/30/2013 0945   GFRNONAA 55* 10/13/2011 0817   GFRAA >60 09/30/2013 0945   GFRAA 64* 10/13/2011 0817    No results found for: SPEP, UPEP  Lab Results  Component Value Date   WBC 3.9* 12/07/2014   NEUTROABS 1.8 12/07/2014   HGB 13.9 12/07/2014   HCT 39.7 12/07/2014   MCV 101.5* 12/07/2014   PLT 85* 12/07/2014      Chemistry      Component Value Date/Time   NA 140 12/07/2014 1147   NA 141 04/12/2014 1041   NA 138 09/30/2013 0945   K 4.1 12/07/2014 1147   K 4.0 04/12/2014 1041   K 4.3 09/30/2013 0945   CL 105 04/12/2014 1041   CL 108* 09/30/2013 0945   CL 110* 12/29/2012 1151   CO2 25 12/07/2014 1147   CO2 23 04/12/2014 1041   CO2 26 09/30/2013 0945   BUN 18.6 12/07/2014 1147   BUN  26* 04/12/2014 1041   BUN 25* 09/30/2013 0945   CREATININE 1.3 12/07/2014 1147   CREATININE 1.27 04/12/2014 1041   CREATININE 1.16 09/30/2013 0945      Component Value Date/Time   CALCIUM 8.4 12/07/2014 1147   CALCIUM 9.3 04/12/2014 1041   CALCIUM 8.3* 09/30/2013 0945   ALKPHOS 58 12/07/2014 1147   ALKPHOS 45 04/12/2014 1041   AST 26 12/07/2014 1147   AST 19 04/12/2014 1041   ALT 25 12/07/2014 1147   ALT 19 04/12/2014 1041   BILITOT 0.74 12/07/2014 1147   BILITOT 0.7 04/12/2014 1041      ASSESSMENT & PLAN:  Multiple myeloma We discussed treatment plan.  He was treated with bone marrow transplant but the effect did not last. Since he was placed on Revlimid, he achieved complete remission. I felt that the dose of Revlimid with is too strong and it is causing significant pancytopenia, despite lowering the dose of Revlimid to 10 mg and recently to 5 mg. He will continue on 81 mg aspirin to prevent DVT. He will visit with dentist twice a year for dental checkup while on Zometa. He will remain on calcium with vitamin D  supplement.   Leukopenia due to antineoplastic chemotherapy This is likely due to recent treatment. The patient denies recent history of fevers, cough, chills, diarrhea or dysuria. He is asymptomatic from the leukopenia. I will observe for now.      Thrombocytopenia due to drugs This is likely due to recent treatment. The patient denies recent history of bleeding such as epistaxis, hematuria or hematochezia. He is asymptomatic from the low platelet count. I will observe for now.  he does not require transfusion now. I will continue the chemotherapy at current dose without dosage adjustment.  If the thrombocytopenia gets progressive worse in the future, I might have to delay his treatment or adjust the chemotherapy dose.   Lumbar compression fracture He had kyphoplasty. He remained on Zometa. I plan to lower the future dose of fentanyl to 37 mcg q 72 hours We have a long discussion today. The patient does not want to take any form of breakthrough pain medicine. He is comfortable to remain on 50 g fentanyl patch for the rest of his life. I told him the rationale of lowering down the prescription but the patient is not willing to go along with the plan. He ultimately went back to [facility] pain management clinic for chronic pain management    No orders of the defined types were placed in this encounter.   All questions were answered. The patient knows to call the clinic with any problems, questions or concerns. No barriers to learning was detected. I spent 25 minutes counseling the patient face to face. The total time spent in the appointment was 30 minutes and more than 50% was on counseling and review of test results     [facility], Jeremiah Benegas, MD 12/08/2014 9:12 PM    This encounter was created in error - please disregard.

## 2014-12-08 NOTE — Assessment & Plan Note (Signed)
He had kyphoplasty. He remained on Zometa. I plan to lower the future dose of fentanyl to 37 mcg q 72 hours We have a long discussion today. The patient does not want to take any form of breakthrough pain medicine. He is comfortable to remain on 50 g fentanyl patch for the rest of his life. I told him the rationale of lowering down the prescription but the patient is not willing to go along with the plan. He ultimately went back to [facility] pain management clinic for chronic pain management

## 2014-12-08 NOTE — Assessment & Plan Note (Signed)
This is likely due to recent treatment. The patient denies recent history of bleeding such as epistaxis, hematuria or hematochezia. He is asymptomatic from the low platelet count. I will observe for now.  he does not require transfusion now. I will continue the chemotherapy at current dose without dosage adjustment.  If the thrombocytopenia gets progressive worse in the future, I might have to delay his treatment or adjust the chemotherapy dose.

## 2014-12-08 NOTE — Assessment & Plan Note (Signed)
This is likely due to recent treatment. The patient denies recent history of fevers, cough, chills, diarrhea or dysuria. He is asymptomatic from the leukopenia. I will observe for now.  

## 2014-12-11 ENCOUNTER — Other Ambulatory Visit: Payer: Self-pay | Admitting: *Deleted

## 2014-12-11 DIAGNOSIS — C9000 Multiple myeloma not having achieved remission: Secondary | ICD-10-CM

## 2014-12-11 MED ORDER — LENALIDOMIDE 5 MG PO CAPS: ORAL_CAPSULE | ORAL | Status: DC

## 2014-12-26 ENCOUNTER — Encounter: Payer: Self-pay | Admitting: Podiatry

## 2014-12-26 ENCOUNTER — Ambulatory Visit (INDEPENDENT_AMBULATORY_CARE_PROVIDER_SITE_OTHER): Payer: Medicare Other | Admitting: Podiatry

## 2014-12-26 VITALS — Ht 74.0 in | Wt 200.0 lb

## 2014-12-26 DIAGNOSIS — M79675 Pain in left toe(s): Secondary | ICD-10-CM

## 2014-12-26 NOTE — Progress Notes (Signed)
He presents today for follow-up of his cellulitis second digit left foot. He states that he is finished up his antibiotically sink continues to use the Bactroban ointment. He states that it seems that  my fifth toe may be starting to become problematic as well.I discussed shoe gear with him where he states that he has a pair of sketchers tennis shoes that may be bothering him. He is also complaining of pain to the fifth metatarsal bases bilateral.  Objective: Vital signs are stable alert and oriented 3. Completely resolved cellulitis of the second digit as well as to the fifth digit with a area of mild hyperkeratosis to both toes. I see no signs of infection this point however he does have irritation along the fifth metatarsal bases or the likely secondary to shoe gear change.  Assessment: Irritation to the toes and cellulitis to toes associated with shoe gear more than likely. This is seems to be resolving.  Plan: I encouraged him to continue to utilize the toe pads and to wear wider shoes.

## 2015-01-08 ENCOUNTER — Other Ambulatory Visit: Payer: Self-pay | Admitting: *Deleted

## 2015-01-08 DIAGNOSIS — C9000 Multiple myeloma not having achieved remission: Secondary | ICD-10-CM

## 2015-01-08 MED ORDER — LENALIDOMIDE 5 MG PO CAPS: ORAL_CAPSULE | ORAL | Status: DC

## 2015-01-09 NOTE — Telephone Encounter (Signed)
[facility] faxed confirmation of facsimile receipt for Revlimid prescription referral.  [facility] will verify insurance and make delivery arrangements with patient.

## 2015-01-18 ENCOUNTER — Ambulatory Visit (HOSPITAL_BASED_OUTPATIENT_CLINIC_OR_DEPARTMENT_OTHER): Payer: Medicare Other

## 2015-01-18 ENCOUNTER — Other Ambulatory Visit (HOSPITAL_BASED_OUTPATIENT_CLINIC_OR_DEPARTMENT_OTHER): Payer: Medicare Other

## 2015-01-18 VITALS — BP 120/62 | HR 65 | Temp 98.3°F

## 2015-01-18 DIAGNOSIS — C9000 Multiple myeloma not having achieved remission: Secondary | ICD-10-CM | POA: Diagnosis present

## 2015-01-18 DIAGNOSIS — Z452 Encounter for adjustment and management of vascular access device: Secondary | ICD-10-CM | POA: Diagnosis present

## 2015-01-18 DIAGNOSIS — Z95828 Presence of other vascular implants and grafts: Secondary | ICD-10-CM

## 2015-01-18 LAB — COMPREHENSIVE METABOLIC PANEL (CC13)
ALT: 15 U/L (ref 0–55)
AST: 19 U/L (ref 5–34)
Albumin: 3.4 g/dL — ABNORMAL LOW (ref 3.5–5.0)
Alkaline Phosphatase: 59 U/L (ref 40–150)
Anion Gap: 5 mEq/L (ref 3–11)
BILIRUBIN TOTAL: 0.84 mg/dL (ref 0.20–1.20)
BUN: 22.1 mg/dL (ref 7.0–26.0)
CO2: 26 mEq/L (ref 22–29)
Calcium: 8.9 mg/dL (ref 8.4–10.4)
Chloride: 108 mEq/L (ref 98–109)
Creatinine: 1.1 mg/dL (ref 0.7–1.3)
EGFR: 65 mL/min/{1.73_m2} — ABNORMAL LOW (ref >90)
Glucose: 102 mg/dl (ref 70–140)
POTASSIUM: 3.9 meq/L (ref 3.5–5.1)
Sodium: 140 mEq/L (ref 136–145)
Total Protein: 5.9 g/dL — ABNORMAL LOW (ref 6.4–8.3)

## 2015-01-18 LAB — CBC WITH DIFFERENTIAL/PLATELET
BASO%: 2.8 % — ABNORMAL HIGH (ref 0.0–2.0)
Basophils Absolute: 0.1 10*3/uL (ref 0.0–0.1)
EOS%: 12.2 % — ABNORMAL HIGH (ref 0.0–7.0)
Eosinophils Absolute: 0.4 10*3/uL (ref 0.0–0.5)
HCT: 39.7 % (ref 38.4–49.9)
HEMOGLOBIN: 13.8 g/dL (ref 13.0–17.1)
LYMPH%: 26 % (ref 14.0–49.0)
MCH: 34.9 pg — ABNORMAL HIGH (ref 27.2–33.4)
MCHC: 34.8 g/dL (ref 32.0–36.0)
MCV: 100.5 fL — AB (ref 79.3–98.0)
MONO#: 0.9 10*3/uL (ref 0.1–0.9)
MONO%: 23.5 % — ABNORMAL HIGH (ref 0.0–14.0)
NEUT#: 1.3 10*3/uL — ABNORMAL LOW (ref 1.5–6.5)
NEUT%: 35.5 % — ABNORMAL LOW (ref 39.0–75.0)
Platelets: 126 10*3/uL — ABNORMAL LOW (ref 140–400)
RBC: 3.95 10*6/uL — ABNORMAL LOW (ref 4.20–5.82)
RDW: 14 % (ref 11.0–14.6)
WBC: 3.6 10*3/uL — ABNORMAL LOW (ref 4.0–10.3)
lymph#: 0.9 10*3/uL (ref 0.9–3.3)

## 2015-01-18 LAB — LACTATE DEHYDROGENASE (CC13): LDH: 137 U/L (ref 125–245)

## 2015-01-18 MED ORDER — HEPARIN SOD (PORK) LOCK FLUSH 100 UNIT/ML IV SOLN
500.0000 [IU] | Freq: Once | INTRAVENOUS | Status: AC
  Administered 2015-01-18: 500 [IU] via INTRAVENOUS
  Filled 2015-01-18: qty 5

## 2015-01-18 MED ORDER — SODIUM CHLORIDE 0.9 % IJ SOLN
10.0000 mL | INTRAMUSCULAR | Status: DC | PRN
  Administered 2015-01-18: 10 mL via INTRAVENOUS
  Filled 2015-01-18: qty 10

## 2015-01-18 NOTE — Patient Instructions (Signed)

## 2015-01-22 LAB — KAPPA/LAMBDA LIGHT CHAINS
Kappa free light chain: 3.26 mg/dL — ABNORMAL HIGH (ref 0.33–1.94)
Kappa:Lambda Ratio: 1.23 (ref 0.26–1.65)
Lambda Free Lght Chn: 2.65 mg/dL — ABNORMAL HIGH (ref 0.57–2.63)

## 2015-01-22 LAB — SPEP & IFE WITH QIG
ALBUMIN ELP: 3.6 g/dL — AB (ref 3.8–4.8)
ALPHA-2-GLOBULIN: 0.7 g/dL (ref 0.5–0.9)
Alpha-1-Globulin: 0.3 g/dL (ref 0.2–0.3)
BETA GLOBULIN: 0.3 g/dL — AB (ref 0.4–0.6)
Beta 2: 0.3 g/dL (ref 0.2–0.5)
Gamma Globulin: 0.9 g/dL (ref 0.8–1.7)
IGA: 110 mg/dL (ref 68–379)
IGG (IMMUNOGLOBIN G), SERUM: 935 mg/dL (ref 650–1600)
IgM, Serum: 10 mg/dL — ABNORMAL LOW (ref 41–251)
Total Protein, Serum Electrophoresis: 6 g/dL — ABNORMAL LOW (ref 6.1–8.1)

## 2015-01-23 ENCOUNTER — Ambulatory Visit (INDEPENDENT_AMBULATORY_CARE_PROVIDER_SITE_OTHER): Payer: Medicare Other | Admitting: Podiatry

## 2015-01-23 ENCOUNTER — Encounter: Payer: Self-pay | Admitting: Podiatry

## 2015-01-23 VITALS — BP 125/66 | HR 75 | Resp 16

## 2015-01-23 DIAGNOSIS — G629 Polyneuropathy, unspecified: Secondary | ICD-10-CM

## 2015-01-23 DIAGNOSIS — L02619 Cutaneous abscess of unspecified foot: Secondary | ICD-10-CM | POA: Diagnosis not present

## 2015-01-23 DIAGNOSIS — L820 Inflamed seborrheic keratosis: Secondary | ICD-10-CM | POA: Diagnosis not present

## 2015-01-23 DIAGNOSIS — L03119 Cellulitis of unspecified part of limb: Secondary | ICD-10-CM | POA: Diagnosis not present

## 2015-01-23 NOTE — Progress Notes (Signed)
He presents today for follow-up of his ulceration and cellulitis fifth digit of the left foot. Chemotherapy has left him with marked neuropathy. He states that his toes don't hurt but the fifth one stays red. He states that when he gets up in the morning it is not red but after a few hours of walking on it it becomes red and swollen.  Objective: Vital signs are stable he is alert and oriented 3 pulses remain palpable no changes in neurovascular status. Fifth toe does demonstrate edema with mild erythema no cellulitis no purulence no odor no drainage.  Assessment: Debrided eschar fifth toe left foot. Chemotherapy neuropathy. Digital deformity with hallux valgus and tailor bunion deformity. Superficial ulceration fifth digit left. Follow-up with him in 1 month

## 2015-02-06 ENCOUNTER — Telehealth: Payer: Self-pay | Admitting: *Deleted

## 2015-02-06 DIAGNOSIS — C9000 Multiple myeloma not having achieved remission: Secondary | ICD-10-CM

## 2015-02-06 MED ORDER — PANTOPRAZOLE SODIUM 40 MG PO TBEC: 40.0000 mg | DELAYED_RELEASE_TABLET | Freq: Every day | ORAL | Status: DC

## 2015-02-06 NOTE — Telephone Encounter (Signed)
Patient called stating that he needs a refill for his revlimid. Message sent to RN Tammi.

## 2015-02-07 ENCOUNTER — Other Ambulatory Visit: Payer: Self-pay | Admitting: *Deleted

## 2015-02-07 ENCOUNTER — Telehealth: Payer: Self-pay

## 2015-02-07 DIAGNOSIS — C9000 Multiple myeloma not having achieved remission: Secondary | ICD-10-CM

## 2015-02-07 MED ORDER — LENALIDOMIDE 5 MG PO CAPS: ORAL_CAPSULE | ORAL | Status: DC

## 2015-02-07 NOTE — Telephone Encounter (Signed)
revlimid refill request to Dr Alvy Bimler

## 2015-03-01 ENCOUNTER — Ambulatory Visit (HOSPITAL_BASED_OUTPATIENT_CLINIC_OR_DEPARTMENT_OTHER): Payer: Medicare Other

## 2015-03-01 ENCOUNTER — Telehealth: Payer: Self-pay | Admitting: Hematology and Oncology

## 2015-03-01 ENCOUNTER — Ambulatory Visit: Payer: Medicare Other

## 2015-03-01 ENCOUNTER — Other Ambulatory Visit (HOSPITAL_BASED_OUTPATIENT_CLINIC_OR_DEPARTMENT_OTHER): Payer: Medicare Other

## 2015-03-01 ENCOUNTER — Ambulatory Visit (HOSPITAL_BASED_OUTPATIENT_CLINIC_OR_DEPARTMENT_OTHER): Payer: Medicare Other | Admitting: Hematology and Oncology

## 2015-03-01 ENCOUNTER — Telehealth: Payer: Self-pay | Admitting: *Deleted

## 2015-03-01 ENCOUNTER — Encounter: Payer: Self-pay | Admitting: Hematology and Oncology

## 2015-03-01 VITALS — BP 117/66 | HR 73 | Temp 98.2°F | Resp 20

## 2015-03-01 VITALS — BP 117/66 | HR 73 | Temp 98.2°F | Resp 20 | Ht 74.0 in | Wt 196.0 lb

## 2015-03-01 DIAGNOSIS — C9000 Multiple myeloma not having achieved remission: Secondary | ICD-10-CM

## 2015-03-01 DIAGNOSIS — D6959 Other secondary thrombocytopenia: Secondary | ICD-10-CM | POA: Diagnosis not present

## 2015-03-01 DIAGNOSIS — T451X5A Adverse effect of antineoplastic and immunosuppressive drugs, initial encounter: Secondary | ICD-10-CM

## 2015-03-01 DIAGNOSIS — D72819 Decreased white blood cell count, unspecified: Secondary | ICD-10-CM | POA: Diagnosis not present

## 2015-03-01 DIAGNOSIS — T50905A Adverse effect of unspecified drugs, medicaments and biological substances, initial encounter: Secondary | ICD-10-CM

## 2015-03-01 DIAGNOSIS — D701 Agranulocytosis secondary to cancer chemotherapy: Secondary | ICD-10-CM

## 2015-03-01 LAB — CBC WITH DIFFERENTIAL/PLATELET
BASO%: 1.1 % (ref 0.0–2.0)
Basophils Absolute: 0 10*3/uL (ref 0.0–0.1)
EOS%: 10.6 % — ABNORMAL HIGH (ref 0.0–7.0)
Eosinophils Absolute: 0.3 10*3/uL (ref 0.0–0.5)
HEMATOCRIT: 40.8 % (ref 38.4–49.9)
HGB: 14.2 g/dL (ref 13.0–17.1)
LYMPH%: 16.6 % (ref 14.0–49.0)
MCH: 34.8 pg — ABNORMAL HIGH (ref 27.2–33.4)
MCHC: 34.8 g/dL (ref 32.0–36.0)
MCV: 100 fL — ABNORMAL HIGH (ref 79.3–98.0)
MONO#: 0.6 10*3/uL (ref 0.1–0.9)
MONO%: 19.8 % — ABNORMAL HIGH (ref 0.0–14.0)
NEUT%: 51.9 % (ref 39.0–75.0)
NEUTROS ABS: 1.5 10*3/uL (ref 1.5–6.5)
Platelets: 91 10*3/uL — ABNORMAL LOW (ref 140–400)
RBC: 4.08 10*6/uL — ABNORMAL LOW (ref 4.20–5.82)
RDW: 14.4 % (ref 11.0–14.6)
WBC: 2.8 10*3/uL — ABNORMAL LOW (ref 4.0–10.3)
lymph#: 0.5 10*3/uL — ABNORMAL LOW (ref 0.9–3.3)
nRBC: 0 % (ref 0–0)

## 2015-03-01 LAB — COMPREHENSIVE METABOLIC PANEL (CC13)
ALK PHOS: 54 U/L (ref 40–150)
ALT: 27 U/L (ref 0–55)
AST: 30 U/L (ref 5–34)
Albumin: 3.3 g/dL — ABNORMAL LOW (ref 3.5–5.0)
Anion Gap: 7 mEq/L (ref 3–11)
BILIRUBIN TOTAL: 0.79 mg/dL (ref 0.20–1.20)
BUN: 20.3 mg/dL (ref 7.0–26.0)
CALCIUM: 8.9 mg/dL (ref 8.4–10.4)
CHLORIDE: 109 meq/L (ref 98–109)
CO2: 26 mEq/L (ref 22–29)
CREATININE: 1.3 mg/dL (ref 0.7–1.3)
EGFR: 55 mL/min/{1.73_m2} — AB (ref >90)
Glucose: 114 mg/dl (ref 70–140)
Potassium: 4.1 mEq/L (ref 3.5–5.1)
Sodium: 142 mEq/L (ref 136–145)
TOTAL PROTEIN: 6.1 g/dL — AB (ref 6.4–8.3)

## 2015-03-01 MED ORDER — HEPARIN SOD (PORK) LOCK FLUSH 100 UNIT/ML IV SOLN
500.0000 [IU] | Freq: Once | INTRAVENOUS | Status: AC | PRN
  Administered 2015-03-01: 500 [IU]
  Filled 2015-03-01: qty 5

## 2015-03-01 MED ORDER — ZOLEDRONIC ACID 4 MG/5ML IV CONC
4.0000 mg | Freq: Once | INTRAVENOUS | Status: AC
  Administered 2015-03-01: 4 mg via INTRAVENOUS
  Filled 2015-03-01: qty 5

## 2015-03-01 MED ORDER — SODIUM CHLORIDE 0.9 % IV SOLN
Freq: Once | INTRAVENOUS | Status: AC
  Administered 2015-03-01: 13:00:00 via INTRAVENOUS

## 2015-03-01 MED ORDER — HEPARIN SOD (PORK) LOCK FLUSH 100 UNIT/ML IV SOLN
500.0000 [IU] | Freq: Once | INTRAVENOUS | Status: AC
  Administered 2015-03-01: 500 [IU] via INTRAVENOUS
  Filled 2015-03-01: qty 5

## 2015-03-01 MED ORDER — SODIUM CHLORIDE 0.9 % IJ SOLN
10.0000 mL | INTRAMUSCULAR | Status: DC | PRN
  Administered 2015-03-01: 10 mL via INTRAVENOUS
  Filled 2015-03-01: qty 10

## 2015-03-01 MED ORDER — SODIUM CHLORIDE 0.9 % IJ SOLN
10.0000 mL | INTRAMUSCULAR | Status: DC | PRN
  Administered 2015-03-01: 10 mL
  Filled 2015-03-01: qty 10

## 2015-03-01 NOTE — Patient Instructions (Signed)

## 2015-03-01 NOTE — Assessment & Plan Note (Signed)
This is likely due to recent treatment. The patient denies recent history of bleeding such as epistaxis, hematuria or hematochezia. He is asymptomatic from the low platelet count. I will observe for now.  he does not require transfusion now. I will continue the chemotherapy at current dose without dosage adjustment.  If the thrombocytopenia gets progressive worse in the future, I might have to delay his treatment or adjust the chemotherapy dose.

## 2015-03-01 NOTE — Assessment & Plan Note (Signed)
We discussed treatment plan.  He was treated with bone marrow transplant but the effect did not last. Since he was placed on Revlimid, he achieved complete remission. I felt that the dose of Revlimid with is too strong and it is causing significant pancytopenia, despite lowering the dose of Revlimid to 10 mg and recently to 5 mg. He will continue on 81 mg aspirin to prevent DVT. He will visit with dentist twice a year for dental checkup while on Zometa. He will remain on calcium with vitamin D supplement. 

## 2015-03-01 NOTE — Telephone Encounter (Signed)
per pof to sch pt appt-per Cameo ok to get zometa on 11/4-sent MW email to sch trmt-pt aware-pt will see updated on MY CHART--gave pt avs

## 2015-03-01 NOTE — Assessment & Plan Note (Signed)
This is likely due to recent treatment. The patient denies recent history of fevers, cough, chills, diarrhea or dysuria. He is asymptomatic from the leukopenia. I will observe for now.  

## 2015-03-01 NOTE — Progress Notes (Signed)
[place] OFFICE PROGRESS NOTE  Patient Care Team: Jinny Sanders, MD as PCP - General  SUMMARY OF ONCOLOGIC HISTORY:  Principle Diagnosis: Kappa light chain multiple myeloma, ISS III with diagnosis established in December 2012.  Prior Therapy:  1. Velcade, Cytoxan, and Decadron on a weekly basis from 07/10/2011 through 10/23/2011; 2. High-dose chemotherapy on 11/23/2011, consisting of carmustine 600 mg IV. He received cytarabine 400 mg IV on 12/24/2011, 12/25/2011, 12/26/2011, and 12/27/2011. He received etoposide 300 mg IV daily from 12/24/2011 through 12/27/2011, four doses. He received melphalan 280 mg IV on 12/28/2011.  3. 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  Current therapy: -Revlimid 5 mg daily, 3 weeks on, 1 week off. Revlimid was started on 05/11/2012.  Zometa was started on 09/02/2012.  Dental clearance was obtained.  Zometa 4 mg was placed on hold due to current osteomyelitis but restarted on 06/23/2013.    He also reports that his PSA is slowly rising and this may require future therapy by his urologist, Dr. Alinda Money.   INTERVAL HISTORY: Please see below for problem oriented charting. He returns for further follow-up. 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 Denies new dental problems.  REVIEW OF SYSTEMS:   Constitutional: Denies fevers, chills or abnormal weight loss Eyes: Denies blurriness of vision Ears, nose, mouth, throat, and face: Denies mucositis or sore throat Respiratory: Denies cough, dyspnea or wheezes Cardiovascular: Denies palpitation, chest discomfort or lower extremity swelling Gastrointestinal:  Denies nausea, heartburn or change in bowel habits Skin: Denies abnormal skin rashes Lymphatics: Denies new  lymphadenopathy or easy bruising Neurological:Denies numbness, tingling or new weaknesses Behavioral/Psych: Mood is stable, no new changes  All other systems were reviewed with the patient and are negative.  I have reviewed the past medical history, past surgical history, social history and family history with the patient and they are unchanged from previous note.  ALLERGIES:  is allergic to celebrex; cymbalta; meloxicam; percocet; vicodin; and lyrica.  MEDICATIONS:  Current Outpatient Prescriptions  Medication Sig Dispense Refill  . aspirin 81 MG tablet Take 81 mg by mouth daily.    Marland Kitchen b complex vitamins tablet Take 1 tablet by mouth daily.    . cholecalciferol (VITAMIN D) 1000 UNITS tablet Take 1,000 Units by mouth daily.    . fentaNYL (DURAGESIC - DOSED MCG/HR) 50 MCG/HR Place 1 patch (50 mcg total) onto the skin every 3 (three) days. 10 patch 0  . lenalidomide (REVLIMID) 5 MG capsule Take one capsule by mouth daily for 21 days, then off 7 days. 21 capsule 0  . lidocaine-prilocaine (EMLA) cream Apply topically as needed. Apply to port site one hour before treatment and cover with plastic wrap 30 g 2  . mupirocin ointment (BACTROBAN) 2 % Apply to wound after soaking BID 30 g 1  . NONFORMULARY OR COMPOUNDED ITEM Baclofn/amitrp/ketamn cream topically to feet TID prn for neuropathy    . oxyCODONE (OXY IR/ROXICODONE) 5 MG immediate release tablet Take 1 tablet (5 mg total) by mouth every 4 (four) hours as needed for severe pain. 60 tablet 0  . pantoprazole (PROTONIX) 40 MG tablet Take 1 tablet (40 mg total) by mouth daily. 90 tablet 3  . prochlorperazine (COMPAZINE) 10 MG tablet Take 10 mg by mouth every 6 (six) hours as needed.    . vitamin  C (ASCORBIC ACID) 500 MG tablet Take 1 tablet by mouth daily.     No current facility-administered medications for this visit.   Facility-Administered Medications Ordered in Other Visits  Medication Dose Route Frequency Provider Last Rate Last Dose  .  heparin lock flush 100 unit/mL  500 Units Intracatheter Once PRN Heath Lark, MD      . sodium chloride 0.9 % injection 10 mL  10 mL Intracatheter PRN Heath Lark, MD      . zolendronic acid (ZOMETA) 4 mg IVPB  4 mg Intravenous Once Heath Lark, MD 20 mL/hr at 03/01/15 1312 4 mg at 03/01/15 1312    PHYSICAL EXAMINATION: ECOG PERFORMANCE STATUS: 0 - Asymptomatic  Filed Vitals:   03/01/15 1209  BP: 117/66  Pulse: 73  Temp: 98.2 F (36.8 C)  Resp: 20   Filed Weights   03/01/15 1209  Weight: 196 lb (88.905 kg)    GENERAL:alert, no distress and comfortable SKIN: skin color, texture, turgor are normal, no rashes or significant lesions EYES: normal, Conjunctiva are pink and non-injected, sclera clear OROPHARYNX:no exudate, no erythema and lips, buccal mucosa, and tongue normal  NECK: supple, thyroid normal size, non-tender, without nodularity LYMPH:  no palpable lymphadenopathy in the cervical, axillary or inguinal LUNGS: clear to auscultation and percussion with normal breathing effort HEART: regular rate & rhythm and no murmurs and no lower extremity edema ABDOMEN:abdomen soft, non-tender and normal bowel sounds Musculoskeletal:no cyanosis of digits and no clubbing  NEURO: alert & oriented x 3 with fluent speech, no focal motor/sensory deficits  LABORATORY DATA:  I have reviewed the data as listed    Component Value Date/Time   NA 142 03/01/2015 1149   NA 141 04/12/2014 1041   NA 138 09/30/2013 0945   K 4.1 03/01/2015 1149   K 4.0 04/12/2014 1041   K 4.3 09/30/2013 0945   CL 105 04/12/2014 1041   CL 108* 09/30/2013 0945   CL 110* 12/29/2012 1151   CO2 26 03/01/2015 1149   CO2 23 04/12/2014 1041   CO2 26 09/30/2013 0945   GLUCOSE 114 03/01/2015 1149   GLUCOSE 86 04/12/2014 1041   GLUCOSE 92 09/30/2013 0945   GLUCOSE 127* 12/29/2012 1151   BUN 20.3 03/01/2015 1149   BUN 26* 04/12/2014 1041   BUN 25* 09/30/2013 0945   CREATININE 1.3 03/01/2015 1149   CREATININE 1.27  04/12/2014 1041   CREATININE 1.16 09/30/2013 0945   CALCIUM 8.9 03/01/2015 1149   CALCIUM 9.3 04/12/2014 1041   CALCIUM 8.3* 09/30/2013 0945   PROT 6.1* 03/01/2015 1149   PROT 6.0 04/12/2014 1041   ALBUMIN 3.3* 03/01/2015 1149   ALBUMIN 3.4* 04/12/2014 1041   AST 30 03/01/2015 1149   AST 19 04/12/2014 1041   ALT 27 03/01/2015 1149   ALT 19 04/12/2014 1041   ALKPHOS 54 03/01/2015 1149   ALKPHOS 45 04/12/2014 1041   BILITOT 0.79 03/01/2015 1149   BILITOT 0.7 04/12/2014 1041   GFRNONAA >60 09/30/2013 0945   GFRNONAA 55* 10/13/2011 0817   GFRAA >60 09/30/2013 0945   GFRAA 64* 10/13/2011 0817    No results found for: SPEP, UPEP  Lab Results  Component Value Date   WBC 2.8* 03/01/2015   NEUTROABS 1.5 03/01/2015   HGB 14.2 03/01/2015   HCT 40.8 03/01/2015   MCV 100.0* 03/01/2015   PLT 91* 03/01/2015      Chemistry      Component Value Date/Time   NA 142 03/01/2015 1149   NA  141 04/12/2014 1041   NA 138 09/30/2013 0945   K 4.1 03/01/2015 1149   K 4.0 04/12/2014 1041   K 4.3 09/30/2013 0945   CL 105 04/12/2014 1041   CL 108* 09/30/2013 0945   CL 110* 12/29/2012 1151   CO2 26 03/01/2015 1149   CO2 23 04/12/2014 1041   CO2 26 09/30/2013 0945   BUN 20.3 03/01/2015 1149   BUN 26* 04/12/2014 1041   BUN 25* 09/30/2013 0945   CREATININE 1.3 03/01/2015 1149   CREATININE 1.27 04/12/2014 1041   CREATININE 1.16 09/30/2013 0945      Component Value Date/Time   CALCIUM 8.9 03/01/2015 1149   CALCIUM 9.3 04/12/2014 1041   CALCIUM 8.3* 09/30/2013 0945   ALKPHOS 54 03/01/2015 1149   ALKPHOS 45 04/12/2014 1041   AST 30 03/01/2015 1149   AST 19 04/12/2014 1041   ALT 27 03/01/2015 1149   ALT 19 04/12/2014 1041   BILITOT 0.79 03/01/2015 1149   BILITOT 0.7 04/12/2014 1041      ASSESSMENT & PLAN:  Multiple myeloma We discussed treatment plan.  He was treated with bone marrow transplant but the effect did not last. Since he was placed on Revlimid, he achieved complete  remission. I felt that the dose of Revlimid with is too strong and it is causing significant pancytopenia, despite lowering the dose of Revlimid to 10 mg and recently to 5 mg. He will continue on 81 mg aspirin to prevent DVT. He will visit with dentist twice a year for dental checkup while on Zometa. He will remain on calcium with vitamin D supplement.  Leukopenia due to antineoplastic chemotherapy This is likely due to recent treatment. The patient denies recent history of fevers, cough, chills, diarrhea or dysuria. He is asymptomatic from the leukopenia. I will observe for now.       Thrombocytopenia due to drugs This is likely due to recent treatment. The patient denies recent history of bleeding such as epistaxis, hematuria or hematochezia. He is asymptomatic from the low platelet count. I will observe for now.  he does not require transfusion now. I will continue the chemotherapy at current dose without dosage adjustment.  If the thrombocytopenia gets progressive worse in the future, I might have to delay his treatment or adjust the chemotherapy dose.     Orders Placed This Encounter  Procedures  . SPEP & IFE with QIG    Standing Status: Future     Number of Occurrences:      Standing Expiration Date: 04/04/2016  . Kappa/lambda light chains    Standing Status: Future     Number of Occurrences:      Standing Expiration Date: 04/04/2016  . Beta 2 microglobulin, serum    Standing Status: Future     Number of Occurrences:      Standing Expiration Date: 04/04/2016   All questions were answered. The patient knows to call the clinic with any problems, questions or concerns. No barriers to learning was detected. I spent 15 minutes counseling the patient face to face. The total time spent in the appointment was 20 minutes and more than 50% was on counseling and review of test results     [employer], [place], MD 03/01/2015 1:25 PM

## 2015-03-01 NOTE — Telephone Encounter (Signed)
Per staff message and POF I have scheduled appts. Advised scheduler of appts. JMW  

## 2015-03-04 ENCOUNTER — Telehealth: Payer: Self-pay | Admitting: *Deleted

## 2015-03-04 NOTE — Telephone Encounter (Signed)
It would be unusual that he developed side-effects from Revlimid after being on it for so long In any case, I recommend he hold this for now I cannot treat over the phone; the patient needs to follow the advise of the treating physician in [facility]

## 2015-03-04 NOTE — Telephone Encounter (Addendum)
"  Clancey woke up yesterday with a rash.  Some areas look like they have pus in the center of them.  We called yesterday to Dr. on call and were told this may be from the Revlimid.  Take Claritin, an oatmeal bath or cool baking powder bath.  Today he is starting to itch some.  Does Dr. Alvy Bimler have any other advice.  We're on the road in [place] today and will be in [place] this afternoon.  We'll be in [place] for a week and can't come in."  1025 called and learned the rash is located on "[place] and they are currently in [place] in a walk in clinic.  Kieth Brightly states they will treat with atarax, shot of steroids, and maybe epinephrine."  She then gave the mobile phone to the doctor.  Per Urgent Care Doctor, appears to be a "vestibular rash, early Steves-Woolum".  Asked where we are located and he will treat and advised they go to emergency room for further treatment if needed.  Return number is [phone].

## 2015-03-04 NOTE — Telephone Encounter (Signed)
Called patient who reports "the rash is also on his face.   He received a dexamethasone injection, medrol dose pack, pepcid, atarax."  Instructed to hold Revlimid.  "He only has two more Revlimid left before the rest period.  Could this be from the zometa?"  Not a usual event with zometa.  Asked that they call next Monday for update and to plan if he needs to be seen.  Advised that they drive to ER if tongue swelling

## 2015-03-04 NOTE — Telephone Encounter (Signed)
Opened in error

## 2015-03-05 ENCOUNTER — Other Ambulatory Visit: Payer: Self-pay | Admitting: *Deleted

## 2015-03-07 ENCOUNTER — Other Ambulatory Visit: Payer: Self-pay | Admitting: *Deleted

## 2015-03-07 DIAGNOSIS — C9000 Multiple myeloma not having achieved remission: Secondary | ICD-10-CM

## 2015-03-07 MED ORDER — LENALIDOMIDE 5 MG PO CAPS: ORAL_CAPSULE | ORAL | Status: DC

## 2015-03-08 ENCOUNTER — Telehealth: Payer: Self-pay | Admitting: *Deleted

## 2015-03-08 NOTE — Telephone Encounter (Signed)
Pt on his way back home from out of town.  States his rash is clearing up but still has some spots.  He asks if he can see Dr. Alvy Bimler early next week?  He is due to resume Revlimid next week.  He thinks his reaction was due to Zometa but wants to see Dr. Alvy Bimler before resuming treatment.  Informed pt will call him back Monday morning with appt d/t.

## 2015-03-09 ENCOUNTER — Telehealth: Payer: Self-pay | Admitting: *Deleted

## 2015-03-09 ENCOUNTER — Other Ambulatory Visit: Payer: Self-pay | Admitting: Hematology and Oncology

## 2015-03-09 NOTE — Telephone Encounter (Signed)
Placed POF for 2 pm check in, no labs

## 2015-03-09 NOTE — Telephone Encounter (Signed)
LVM informing pt of appt on Monday at 2 pm.  Arrive 15 min early to check in.

## 2015-03-11 ENCOUNTER — Telehealth: Payer: Self-pay | Admitting: Hematology and Oncology

## 2015-03-11 ENCOUNTER — Ambulatory Visit (HOSPITAL_BASED_OUTPATIENT_CLINIC_OR_DEPARTMENT_OTHER): Payer: Medicare Other

## 2015-03-11 ENCOUNTER — Ambulatory Visit (HOSPITAL_BASED_OUTPATIENT_CLINIC_OR_DEPARTMENT_OTHER): Payer: Medicare Other | Admitting: Hematology and Oncology

## 2015-03-11 ENCOUNTER — Encounter: Payer: Self-pay | Admitting: Hematology and Oncology

## 2015-03-11 VITALS — BP 119/66 | HR 88 | Temp 97.7°F | Resp 18 | Ht 74.0 in | Wt 192.7 lb

## 2015-03-11 DIAGNOSIS — C9000 Multiple myeloma not having achieved remission: Secondary | ICD-10-CM

## 2015-03-11 DIAGNOSIS — R238 Other skin changes: Secondary | ICD-10-CM

## 2015-03-11 LAB — CBC WITH DIFFERENTIAL/PLATELET
BASO%: 1 % (ref 0.0–2.0)
Basophils Absolute: 0.1 10*3/uL (ref 0.0–0.1)
EOS%: 11.8 % — ABNORMAL HIGH (ref 0.0–7.0)
Eosinophils Absolute: 0.6 10*3/uL — ABNORMAL HIGH (ref 0.0–0.5)
HCT: 47.5 % (ref 38.4–49.9)
HEMOGLOBIN: 15.9 g/dL (ref 13.0–17.1)
LYMPH#: 1.7 10*3/uL (ref 0.9–3.3)
LYMPH%: 31.6 % (ref 14.0–49.0)
MCH: 34.5 pg — ABNORMAL HIGH (ref 27.2–33.4)
MCHC: 33.6 g/dL (ref 32.0–36.0)
MCV: 102.9 fL — ABNORMAL HIGH (ref 79.3–98.0)
MONO#: 1.4 10*3/uL — AB (ref 0.1–0.9)
MONO%: 26.1 % — ABNORMAL HIGH (ref 0.0–14.0)
NEUT#: 1.6 10*3/uL (ref 1.5–6.5)
NEUT%: 29.5 % — AB (ref 39.0–75.0)
PLATELETS: 199 10*3/uL (ref 140–400)
RBC: 4.62 10*6/uL (ref 4.20–5.82)
RDW: 14.7 % — AB (ref 11.0–14.6)
WBC: 5.5 10*3/uL (ref 4.0–10.3)

## 2015-03-11 LAB — COMPREHENSIVE METABOLIC PANEL (CC13)
ALT: 49 U/L (ref 0–55)
AST: 23 U/L (ref 5–34)
Albumin: 3.5 g/dL (ref 3.5–5.0)
Alkaline Phosphatase: 67 U/L (ref 40–150)
Anion Gap: 10 meq/L (ref 3–11)
BUN: 27.3 mg/dL — ABNORMAL HIGH (ref 7.0–26.0)
CO2: 30 meq/L — ABNORMAL HIGH (ref 22–29)
Calcium: 10.2 mg/dL (ref 8.4–10.4)
Chloride: 101 meq/L (ref 98–109)
Creatinine: 1.6 mg/dL — ABNORMAL HIGH (ref 0.7–1.3)
EGFR: 43 ml/min/1.73 m2 — ABNORMAL LOW
Glucose: 93 mg/dL (ref 70–140)
Potassium: 5 meq/L (ref 3.5–5.1)
Sodium: 141 meq/L (ref 136–145)
Total Bilirubin: 0.96 mg/dL (ref 0.20–1.20)
Total Protein: 6.7 g/dL (ref 6.4–8.3)

## 2015-03-11 NOTE — Telephone Encounter (Signed)
Added labs and gave patient avs report and appointments for next appointments September and November. Not other orders beyond lab today.

## 2015-03-11 NOTE — Progress Notes (Signed)
[name] OFF[facility] PROGRESS NOTE  Patient Care Team: Jinny Sanders, MD as PCP - General  SUMMARY OF ONCOLOGIC HISTORY:  Principle Diagnosis: Kappa light chain multiple myeloma, ISS III with diagnosis established in December 2012.  Prior Therapy:  1. Velcade, Cytoxan, and Decadron on a weekly basis from 07/10/2011 through 10/23/2011; 2. High-dose chemotherapy on 11/23/2011, consisting of carmustine 600 mg IV. He received cytarabine 400 mg IV on 12/24/2011, 12/25/2011, 12/26/2011, and 12/27/2011. He received etoposide 300 mg IV daily from 12/24/2011 through 12/27/2011, four doses. He received melphalan 280 mg IV on 12/28/2011.  3. 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  Current therapy: -Revlimid 5 mg daily, 3 weeks on, 1 week off. Revlimid was started on 05/11/2012.  Zometa was started on 09/02/2012.  Dental clearance was obtained.  Zometa 4 mg was placed on hold due to current osteomyelitis but restarted on 06/23/2013.    He also reports that his PSA is slowly rising and this may require future therapy by his urologist, Dr. Alinda Money.   INTERVAL HISTORY: Please see below for problem oriented charting. He is seen urgently because of recent outbreak of diffuse rash. The patient receives Zometa recently. The day after that, he broke out in diffuse rash which is vesicular in nature. He was seen in the local emergency department when he was traveling to [place] and was told he might have Stevens-Templin syndrome. He received a course of steroids, Hydroxyzine and Pepcid. His rash is improving and he started to form healthy crust. He denies significant itching.  REVIEW OF SYSTEMS:   Constitutional: Denies fevers, chills or abnormal weight loss Eyes: Denies blurriness of vision Ears, nose, mouth, throat, and face: Denies mucositis or sore throat Respiratory: Denies cough, dyspnea or wheezes Cardiovascular: Denies palpitation, chest discomfort or lower extremity  swelling Gastrointestinal:  Denies nausea, heartburn or change in bowel habits Lymphatics: Denies new lymphadenopathy or easy bruising Neurological:Denies numbness, tingling or new weaknesses Behavioral/Psych: Mood is stable, no new changes  All other systems were reviewed with the patient and are negative.  I have reviewed the past medical history, past surgical history, social history and family history with the patient and they are unchanged from previous note.  ALLERGIES:  is allergic to celebrex; cymbalta; meloxicam; percocet; vicodin; and lyrica.  MEDICATIONS:  Current Outpatient Prescriptions  Medication Sig Dispense Refill  . aspirin 81 MG tablet Take 81 mg by mouth daily.    Marland Kitchen b complex vitamins tablet Take 1 tablet by mouth daily.    . cholecalciferol (VITAMIN D) 1000 UNITS tablet Take 1,000 Units by mouth daily.    . fentaNYL (DURAGESIC - DOSED MCG/HR) 50 MCG/HR Place 1 patch (50 mcg total) onto the skin every 3 (three) days. 10 patch 0  . lenalidomide (REVLIMID) 5 MG capsule Take one capsule by mouth daily for 21 days, then off 7 days. 21 capsule 0  . lidocaine-prilocaine (EMLA) cream Apply topically as needed. Apply to port site one hour before treatment and cover with plastic wrap 30 g 2  . mupirocin ointment (BACTROBAN) 2 % Apply to wound after soaking BID 30 g 1  . NONFORMULARY OR COMPOUNDED ITEM Baclofn/amitrp/ketamn cream topically to feet TID prn for neuropathy    . oxyCODONE (OXY IR/ROXICODONE) 5 MG immediate release tablet Take 1 tablet (5 mg total) by mouth every 4 (four) hours as needed for severe pain. 60 tablet 0  . pantoprazole (PROTONIX) 40 MG tablet Take 1 tablet (40 mg total) by mouth  daily. 90 tablet 3  . prochlorperazine (COMPAZINE) 10 MG tablet Take 10 mg by mouth every 6 (six) hours as needed.    . vitamin C (ASCORBIC ACID) 500 MG tablet Take 1 tablet by mouth daily.     No current facility-administered medications for this visit.    PHYSICAL  EXAMINATION: ECOG PERFORMANCE STATUS: 0 - Asymptomatic  Filed Vitals:   03/11/15 1351  BP: 119/66  Pulse: 88  Temp: 97.7 F (36.5 C)  Resp: 18   Filed Weights   03/11/15 1351  Weight: 192 lb 11.2 oz (87.408 kg)    GENERAL:alert, no distress and comfortable SKIN: He has diffuse vesicular rash resembling chickenpox but most of the lesions are crusted EYES: normal, Conjunctiva are pink and non-injected, sclera clear Musculoskeletal:no cyanosis of digits and no clubbing  NEURO: alert & oriented x 3 with fluent speech, no focal motor/sensory deficits  LABORATORY DATA:  I have reviewed the data as listed    Component Value Date/Time   NA 142 03/01/2015 1149   NA 141 04/12/2014 1041   NA 138 09/30/2013 0945   K 4.1 03/01/2015 1149   K 4.0 04/12/2014 1041   K 4.3 09/30/2013 0945   CL 105 04/12/2014 1041   CL 108* 09/30/2013 0945   CL 110* 12/29/2012 1151   CO2 26 03/01/2015 1149   CO2 23 04/12/2014 1041   CO2 26 09/30/2013 0945   GLUCOSE 114 03/01/2015 1149   GLUCOSE 86 04/12/2014 1041   GLUCOSE 92 09/30/2013 0945   GLUCOSE 127* 12/29/2012 1151   BUN 20.3 03/01/2015 1149   BUN 26* 04/12/2014 1041   BUN 25* 09/30/2013 0945   CREATININE 1.3 03/01/2015 1149   CREATININE 1.27 04/12/2014 1041   CREATININE 1.16 09/30/2013 0945   CALCIUM 8.9 03/01/2015 1149   CALCIUM 9.3 04/12/2014 1041   CALCIUM 8.3* 09/30/2013 0945   PROT 6.1* 03/01/2015 1149   PROT 6.0 04/12/2014 1041   ALBUMIN 3.3* 03/01/2015 1149   ALBUMIN 3.4* 04/12/2014 1041   AST 30 03/01/2015 1149   AST 19 04/12/2014 1041   ALT 27 03/01/2015 1149   ALT 19 04/12/2014 1041   ALKPHOS 54 03/01/2015 1149   ALKPHOS 45 04/12/2014 1041   BILITOT 0.79 03/01/2015 1149   BILITOT 0.7 04/12/2014 1041   GFRNONAA >60 09/30/2013 0945   GFRNONAA 55* 10/13/2011 0817   GFRAA >60 09/30/2013 0945   GFRAA 64* 10/13/2011 0817    No results found for: SPEP, UPEP  Lab Results  Component Value Date   WBC 2.8* 03/01/2015    NEUTROABS 1.5 03/01/2015   HGB 14.2 03/01/2015   HCT 40.8 03/01/2015   MCV 100.0* 03/01/2015   PLT 91* 03/01/2015      Chemistry      Component Value Date/Time   NA 142 03/01/2015 1149   NA 141 04/12/2014 1041   NA 138 09/30/2013 0945   K 4.1 03/01/2015 1149   K 4.0 04/12/2014 1041   K 4.3 09/30/2013 0945   CL 105 04/12/2014 1041   CL 108* 09/30/2013 0945   CL 110* 12/29/2012 1151   CO2 26 03/01/2015 1149   CO2 23 04/12/2014 1041   CO2 26 09/30/2013 0945   BUN 20.3 03/01/2015 1149   BUN 26* 04/12/2014 1041   BUN 25* 09/30/2013 0945   CREATININE 1.3 03/01/2015 1149   CREATININE 1.27 04/12/2014 1041   CREATININE 1.16 09/30/2013 0945      Component Value Date/Time   CALCIUM 8.9 03/01/2015 1149  CALCIUM 9.3 04/12/2014 1041   CALCIUM 8.3* 09/30/2013 0945   ALKPHOS 54 03/01/2015 1149   ALKPHOS 45 04/12/2014 1041   AST 30 03/01/2015 1149   AST 19 04/12/2014 1041   ALT 27 03/01/2015 1149   ALT 19 04/12/2014 1041   BILITOT 0.79 03/01/2015 1149   BILITOT 0.7 04/12/2014 1041      ASSESSMENT & PLAN:  Multiple myeloma I suspect he may have viral rash. I recommend he hold treatment and resume Revlimid on 03/28/2015. I will hold Zometa until his next visit.  Rash, vesicular He has new vesicular rash which I suspect could be due to chickenpox. The patient did not recall having varicella infection when he was younger. He is a post transplant patient and certainly a new outbreak of chickenpox as possible. I do not believe the patient has Stevens-Petropoulos syndrome. He has completed a course of steroids, hydroxyzine and Pepcid. He has appointment to see his dermatologist this week. I recommend holding Revlimid until 03/28/2015. I will proceed to draw blood work for assessment.   Orders Placed This Encounter  Procedures  . Varicella zoster antibody, IgG    Standing Status: Future     Number of Occurrences: 1     Standing Expiration Date: 03/10/2016  . Varicella zoster  antibody, IgM    Standing Status: Future     Number of Occurrences: 1     Standing Expiration Date: 03/10/2016   All questions were answered. The patient knows to call the clinic with any problems, questions or concerns. No barriers to learning was detected. I spent 15 minutes counseling the patient face to face. The total time spent in the appointment was 20 minutes and more than 50% was on counseling and review of test results     [facility], [place], MD 03/11/2015 2:59 PM

## 2015-03-11 NOTE — Telephone Encounter (Signed)
Pt to see Dr. Alvy Bimler, per 08/12 POF, s/w pt confirming ok for today's visit.... KJ

## 2015-03-11 NOTE — Assessment & Plan Note (Signed)
He has new vesicular rash which I suspect could be due to chickenpox. The patient did not recall having varicella infection when he was younger. He is a post transplant patient and certainly a new outbreak of chickenpox as possible. I do not believe the patient has Stevens-Goosby syndrome. He has completed a course of steroids, hydroxyzine and Pepcid. He has appointment to see his dermatologist this week. I recommend holding Revlimid until 03/28/2015. I will proceed to draw blood work for assessment.

## 2015-03-11 NOTE — Assessment & Plan Note (Signed)
I suspect he may have viral rash. I recommend he hold treatment and resume Revlimid on 03/28/2015. I will hold Zometa until his next visit.

## 2015-03-12 ENCOUNTER — Telehealth: Payer: Self-pay | Admitting: *Deleted

## 2015-03-12 LAB — VARICELLA ZOSTER ANTIBODY, IGM: VARICELLA ZOSTER AB IGM: 0.86 {ISR} (ref <0.91)

## 2015-03-12 LAB — VARICELLA ZOSTER ANTIBODY, IGG: Varicella IgG: >4000 Index — ABNORMAL HIGH (ref <135.00)

## 2015-03-12 NOTE — Telephone Encounter (Signed)
Pt notified of message below.

## 2015-03-12 NOTE — Telephone Encounter (Signed)
-----   Message from Heath Lark, MD sent at 03/12/2015  3:03 PM EDT ----- Regarding: blood test Recent blood test suggested he may had chicken pox and has gotten over it I recommend he still keeps dermatology appt to make sure we are not missing anything Resume Revlimid on 9/1 as discussed ----- Message -----    From: [facility] Interface    Sent: 03/11/2015   3:18 PM      To: Heath Lark, MD

## 2015-03-13 ENCOUNTER — Encounter: Payer: Self-pay | Admitting: Podiatry

## 2015-03-13 ENCOUNTER — Ambulatory Visit (INDEPENDENT_AMBULATORY_CARE_PROVIDER_SITE_OTHER): Payer: Medicare Other | Admitting: Podiatry

## 2015-03-13 VITALS — BP 110/70 | HR 76 | Resp 18

## 2015-03-13 DIAGNOSIS — L110 Acquired keratosis follicularis: Secondary | ICD-10-CM

## 2015-03-13 DIAGNOSIS — M79675 Pain in left toe(s): Secondary | ICD-10-CM

## 2015-03-13 DIAGNOSIS — L03119 Cellulitis of unspecified part of limb: Secondary | ICD-10-CM

## 2015-03-13 DIAGNOSIS — L02619 Cutaneous abscess of unspecified foot: Secondary | ICD-10-CM

## 2015-03-13 DIAGNOSIS — G629 Polyneuropathy, unspecified: Secondary | ICD-10-CM

## 2015-03-13 NOTE — Progress Notes (Signed)
He presents today for follow-up of his fifth digit left foot. He states it is still red and sore and he's been wearing silicon pads between the toes on a regular basis. He states that we have to do something about this toe.  Objective: Vital signs are stable he is alert and oriented 3 in no acute distress. Pulses are strongly palpable. Neurologic sensorium is diminished per Semmes-Weinstein monofilament secondary to chemotherapy. Deep tendon reflexes are intact muscle strength is 5 over 5 dorsiflexors and plantar flexors and inverters and everters all intrinsic musculature is intact. Orthopedic evaluation does demonstrate talus bunion deformity with abduction of the fifth digit against the fourth digit. This is resulting in its irritation and skin breakdown to the medial aspect of the fifth digit right foot. That results in mild cellulitis which does not appear today be present today. There is a small callus that is noted which are trimmed today.  Assessment: Tailor's bunion deformity with chemotherapeutic neuropathy. Resulting in juxtaposition of the toe with callus.  Plan: Debrided reactive hyperkeratosis today discussed the possibility of amputation fifth digit left. I will follow-up with him in the near future.

## 2015-03-24 ENCOUNTER — Encounter: Payer: Self-pay | Admitting: Hematology and Oncology

## 2015-03-27 ENCOUNTER — Encounter: Payer: Self-pay | Admitting: Podiatry

## 2015-03-27 ENCOUNTER — Ambulatory Visit (INDEPENDENT_AMBULATORY_CARE_PROVIDER_SITE_OTHER): Payer: Medicare Other | Admitting: Podiatry

## 2015-03-27 VITALS — BP 120/72 | HR 82 | Resp 16

## 2015-03-27 DIAGNOSIS — L02619 Cutaneous abscess of unspecified foot: Secondary | ICD-10-CM | POA: Diagnosis not present

## 2015-03-27 DIAGNOSIS — L03031 Cellulitis of right toe: Secondary | ICD-10-CM

## 2015-03-27 DIAGNOSIS — L03119 Cellulitis of unspecified part of limb: Principal | ICD-10-CM

## 2015-03-27 MED ORDER — AMOXICILLIN-POT CLAVULANATE 875-125 MG PO TABS: 1.0000 | ORAL_TABLET | Freq: Two times a day (BID) | ORAL | Status: DC

## 2015-03-27 MED ORDER — MUPIROCIN 2 % EX OINT: TOPICAL_OINTMENT | CUTANEOUS | Status: DC

## 2015-03-27 NOTE — Progress Notes (Signed)
He presents today chief complaint painful digit second right. He states that there is been written swollen since Sunday but he got it may get better. He denies fever chills nausea vomiting muscle aches and pains.  Objective: Vital signs are stable alert and oriented 3. Second digit of the right foot does demonstrate a very superficial ulceration to the distal medial aspect of the second toe with cellulitis extending to the level of the mid diaphyseal region of the proximal phalanx. It is mildly painful to the touch. I debrided all the reactive hyperkeratosis and the necrotic tissue today the ulceration measures less than 4 mm internal diameter to the medial aspect of the second toe right foot. There is no purulence and no malodor.  Assessment: Ulceration second digit right with cellulitis.  Plan: Started him on Augmentin 875 mg he will continue so Since also warm water once daily and apply Bactroban ointment. I will follow-up with him in 1 week. I advised him that should he develop systemic signs of infection such as fever chills nausea or vomiting or should redness proceed past the level of the metatarsophalangeal joint which I marked he should seek attention from the emergency department.

## 2015-04-03 ENCOUNTER — Ambulatory Visit: Payer: Medicare Other | Admitting: Podiatry

## 2015-04-03 ENCOUNTER — Ambulatory Visit (INDEPENDENT_AMBULATORY_CARE_PROVIDER_SITE_OTHER): Payer: Medicare Other | Admitting: Podiatry

## 2015-04-03 ENCOUNTER — Encounter: Payer: Self-pay | Admitting: Podiatry

## 2015-04-03 VITALS — BP 110/71 | HR 82 | Resp 18

## 2015-04-03 DIAGNOSIS — L02619 Cutaneous abscess of unspecified foot: Secondary | ICD-10-CM | POA: Diagnosis not present

## 2015-04-03 DIAGNOSIS — L03119 Cellulitis of unspecified part of limb: Secondary | ICD-10-CM | POA: Diagnosis not present

## 2015-04-03 NOTE — Progress Notes (Signed)
He presents today for follow-up of his painful erythematous toe second digit right. He states that he took the antibiotic's and his foot is doing much better.  Objective: Vital signs are stable he is alert and oriented 3 pulses remain palpable right. No cellulitis existing to the second digit at this point. Superficial wound appears to have gone on to heal uneventfully medial aspect second digit right.  Assessment: Well-healing toe with cellulitis right.  Plan: Finish up his antibiotic's continue to soak and continue to apply the interdigital spacer. Follow-up with me as needed remember to ask him how history of 2 [place] with

## 2015-04-18 ENCOUNTER — Other Ambulatory Visit: Payer: Self-pay | Admitting: *Deleted

## 2015-04-18 DIAGNOSIS — C9000 Multiple myeloma not having achieved remission: Secondary | ICD-10-CM

## 2015-04-18 MED ORDER — LENALIDOMIDE 5 MG PO CAPS: ORAL_CAPSULE | ORAL | Status: DC

## 2015-04-19 ENCOUNTER — Ambulatory Visit (HOSPITAL_BASED_OUTPATIENT_CLINIC_OR_DEPARTMENT_OTHER): Payer: Medicare Other

## 2015-04-19 ENCOUNTER — Encounter: Payer: Self-pay | Admitting: *Deleted

## 2015-04-19 DIAGNOSIS — C9000 Multiple myeloma not having achieved remission: Secondary | ICD-10-CM

## 2015-04-19 DIAGNOSIS — Z95828 Presence of other vascular implants and grafts: Secondary | ICD-10-CM

## 2015-04-19 DIAGNOSIS — Z452 Encounter for adjustment and management of vascular access device: Secondary | ICD-10-CM

## 2015-04-19 MED ORDER — SODIUM CHLORIDE 0.9 % IJ SOLN
10.0000 mL | INTRAMUSCULAR | Status: DC | PRN
  Administered 2015-04-19: 10 mL via INTRAVENOUS
  Filled 2015-04-19: qty 10

## 2015-04-19 MED ORDER — HEPARIN SOD (PORK) LOCK FLUSH 100 UNIT/ML IV SOLN
500.0000 [IU] | Freq: Once | INTRAVENOUS | Status: AC
  Administered 2015-04-19: 500 [IU] via INTRAVENOUS
  Filled 2015-04-19: qty 5

## 2015-04-19 NOTE — Patient Instructions (Signed)
Implanted Port Home Guide  An implanted port is a type of central line that is placed under the skin. Central lines are used to provide IV access when treatment or nutrition needs to be given through a person's veins. Implanted ports are used for long-term IV access. An implanted port may be placed because:    You need IV medicine that would be irritating to the small veins in your hands or arms.    You need long-term IV medicines, such as antibiotics.    You need IV nutrition for a long period.    You need frequent blood draws for lab tests.    You need dialysis.   Implanted ports are usually placed in the chest area, but they can also be placed in the upper arm, the abdomen, or the leg. An implanted port has two main parts:    Reservoir. The reservoir is round and will appear as a small, raised area under your skin. The reservoir is the part where a needle is inserted to give medicines or draw blood.    Catheter. The catheter is a thin, flexible tube that extends from the reservoir. The catheter is placed into a large vein. Medicine that is inserted into the reservoir goes into the catheter and then into the vein.   HOW WILL I CARE FOR MY INCISION SITE?  Do not get the incision site wet. Bathe or shower as directed by your health care [name].   HOW IS MY PORT ACCESSED?  Special steps must be taken to access the port:    Before the port is accessed, a numbing cream can be placed on the skin. This helps numb the skin over the port site.    Your health care [name] uses a sterile technique to access the port.   Your health care [name] must put on a mask and sterile gloves.   The skin over your port is cleaned carefully with an antiseptic and allowed to dry.   The port is gently pinched between sterile gloves, and a needle is inserted into the port.   Only "non-coring" port needles should be used to access the port. Once the port is accessed, a blood return should be checked. This helps  ensure that the port is in the vein and is not clogged.    If your port needs to remain accessed for a constant infusion, a clear (transparent) bandage will be placed over the needle site. The bandage and needle will need to be changed every week, or as directed by your health care [name].    Keep the bandage covering the needle clean and dry. Do not get it wet. Follow your health care [name]'s instructions on how to take a shower or bath while the port is accessed.    If your port does not need to stay accessed, no bandage is needed over the port.   WHAT IS FLUSHING?  Flushing helps keep the port from getting clogged. Follow your health care [name]'s instructions on how and when to flush the port. Ports are usually flushed with saline solution or a medicine called heparin. The need for flushing will depend on how the port is used.    If the port is used for intermittent medicines or blood draws, the port will need to be flushed:    After medicines have been given.    After blood has been drawn.    As part of routine maintenance.    If a constant infusion is   running, the port may not need to be flushed.   HOW LONG WILL MY PORT STAY IMPLANTED?  The port can stay in for as long as your health care [name] thinks it is needed. When it is time for the port to come out, surgery will be done to remove it. The procedure is similar to the one performed when the port was put in.   WHEN SHOULD I SEEK IMMEDIATE MEDICAL CARE?  When you have an implanted port, you should seek immediate medical care if:    You notice a bad smell coming from the incision site.    You have swelling, redness, or drainage at the incision site.    You have more swelling or pain at the port site or the surrounding area.    You have a fever that is not controlled with medicine.  Document Released: 07/13/2005 Document Revised: 05/03/2013 Document Reviewed: 03/20/2013  [employer] Patient Information 2015 [employer]. This  information is not intended to replace advice given to you by your health care [name]. Make sure you discuss any questions you have with your health care [name].

## 2015-04-23 ENCOUNTER — Encounter: Payer: Self-pay | Admitting: Hematology and Oncology

## 2015-04-23 NOTE — Progress Notes (Signed)
[facility] revlimid was shipped via [facility]

## 2015-04-24 ENCOUNTER — Ambulatory Visit: Payer: Medicare Other | Admitting: Podiatry

## 2015-05-16 ENCOUNTER — Other Ambulatory Visit: Payer: Self-pay | Admitting: *Deleted

## 2015-05-16 ENCOUNTER — Telehealth: Payer: Self-pay | Admitting: Hematology and Oncology

## 2015-05-16 MED ORDER — LENALIDOMIDE 5 MG PO CAPS: ORAL_CAPSULE | ORAL | Status: DC

## 2015-05-16 NOTE — Telephone Encounter (Signed)
S.W. R/S APPT PER PT REQUEST AND md REQUEST...DONE....PT OK AND AWARE

## 2015-05-21 ENCOUNTER — Telehealth: Payer: Self-pay | Admitting: *Deleted

## 2015-05-21 NOTE — Telephone Encounter (Signed)
Pt left VM states due to start Revlimid on Wednesday and has not heard anything from pharmacy.   I called pt back and left him a VM informing him refill was faxed to [facility] last week on 10/20.   Please contact [facility] to arrange delivery and call us back if any problems.

## 2015-05-31 ENCOUNTER — Ambulatory Visit: Payer: Medicare Other

## 2015-05-31 ENCOUNTER — Ambulatory Visit: Payer: Medicare Other | Admitting: Hematology and Oncology

## 2015-05-31 ENCOUNTER — Other Ambulatory Visit: Payer: Medicare Other

## 2015-06-07 ENCOUNTER — Ambulatory Visit (HOSPITAL_BASED_OUTPATIENT_CLINIC_OR_DEPARTMENT_OTHER): Payer: Medicare Other | Admitting: Hematology and Oncology

## 2015-06-07 ENCOUNTER — Telehealth: Payer: Self-pay | Admitting: Hematology and Oncology

## 2015-06-07 ENCOUNTER — Ambulatory Visit (HOSPITAL_BASED_OUTPATIENT_CLINIC_OR_DEPARTMENT_OTHER): Payer: Medicare Other

## 2015-06-07 ENCOUNTER — Ambulatory Visit: Payer: Medicare Other

## 2015-06-07 ENCOUNTER — Encounter: Payer: Self-pay | Admitting: Hematology and Oncology

## 2015-06-07 ENCOUNTER — Ambulatory Visit: Payer: Medicare Other | Admitting: Hematology and Oncology

## 2015-06-07 ENCOUNTER — Other Ambulatory Visit (HOSPITAL_BASED_OUTPATIENT_CLINIC_OR_DEPARTMENT_OTHER): Payer: Medicare Other

## 2015-06-07 VITALS — BP 136/73 | HR 68 | Temp 97.8°F | Resp 18 | Ht 74.0 in | Wt 195.5 lb

## 2015-06-07 DIAGNOSIS — R238 Other skin changes: Secondary | ICD-10-CM

## 2015-06-07 DIAGNOSIS — E669 Obesity, unspecified: Secondary | ICD-10-CM

## 2015-06-07 DIAGNOSIS — D638 Anemia in other chronic diseases classified elsewhere: Secondary | ICD-10-CM

## 2015-06-07 DIAGNOSIS — G620 Drug-induced polyneuropathy: Secondary | ICD-10-CM

## 2015-06-07 DIAGNOSIS — D696 Thrombocytopenia, unspecified: Secondary | ICD-10-CM

## 2015-06-07 DIAGNOSIS — C9001 Multiple myeloma in remission: Secondary | ICD-10-CM

## 2015-06-07 DIAGNOSIS — D701 Agranulocytosis secondary to cancer chemotherapy: Secondary | ICD-10-CM

## 2015-06-07 DIAGNOSIS — M549 Dorsalgia, unspecified: Secondary | ICD-10-CM | POA: Diagnosis not present

## 2015-06-07 DIAGNOSIS — Z95828 Presence of other vascular implants and grafts: Secondary | ICD-10-CM

## 2015-06-07 DIAGNOSIS — G8929 Other chronic pain: Secondary | ICD-10-CM | POA: Diagnosis not present

## 2015-06-07 DIAGNOSIS — T50905A Adverse effect of unspecified drugs, medicaments and biological substances, initial encounter: Secondary | ICD-10-CM

## 2015-06-07 DIAGNOSIS — T451X5A Adverse effect of antineoplastic and immunosuppressive drugs, initial encounter: Principal | ICD-10-CM

## 2015-06-07 DIAGNOSIS — C9000 Multiple myeloma not having achieved remission: Secondary | ICD-10-CM

## 2015-06-07 DIAGNOSIS — C61 Malignant neoplasm of prostate: Secondary | ICD-10-CM

## 2015-06-07 DIAGNOSIS — D6959 Other secondary thrombocytopenia: Secondary | ICD-10-CM

## 2015-06-07 DIAGNOSIS — E781 Pure hyperglyceridemia: Secondary | ICD-10-CM

## 2015-06-07 LAB — COMPREHENSIVE METABOLIC PANEL (CC13)
ALBUMIN: 3.3 g/dL — AB (ref 3.5–5.0)
ALK PHOS: 53 U/L (ref 40–150)
ALT: 24 U/L (ref 0–55)
AST: 22 U/L (ref 5–34)
Anion Gap: 9 mEq/L (ref 3–11)
BUN: 19.9 mg/dL (ref 7.0–26.0)
CALCIUM: 9.2 mg/dL (ref 8.4–10.4)
CHLORIDE: 107 meq/L (ref 98–109)
CO2: 26 meq/L (ref 22–29)
Creatinine: 1.2 mg/dL (ref 0.7–1.3)
EGFR: 59 mL/min/{1.73_m2} — AB (ref >90)
GLUCOSE: 99 mg/dL (ref 70–140)
POTASSIUM: 4.1 meq/L (ref 3.5–5.1)
SODIUM: 141 meq/L (ref 136–145)
Total Bilirubin: 1.15 mg/dL (ref 0.20–1.20)
Total Protein: 6 g/dL — ABNORMAL LOW (ref 6.4–8.3)

## 2015-06-07 LAB — CBC WITH DIFFERENTIAL/PLATELET
BASO%: 2 % (ref 0.0–2.0)
BASOS ABS: 0.1 10*3/uL (ref 0.0–0.1)
EOS%: 10.5 % — AB (ref 0.0–7.0)
Eosinophils Absolute: 0.5 10*3/uL (ref 0.0–0.5)
HCT: 42.5 % (ref 38.4–49.9)
HEMOGLOBIN: 14.7 g/dL (ref 13.0–17.1)
LYMPH#: 1 10*3/uL (ref 0.9–3.3)
LYMPH%: 21.5 % (ref 14.0–49.0)
MCH: 34.4 pg — AB (ref 27.2–33.4)
MCHC: 34.6 g/dL (ref 32.0–36.0)
MCV: 99.5 fL — AB (ref 79.3–98.0)
MONO#: 0.9 10*3/uL (ref 0.1–0.9)
MONO%: 19.5 % — ABNORMAL HIGH (ref 0.0–14.0)
NEUT#: 2.1 10*3/uL (ref 1.5–6.5)
NEUT%: 46.5 % (ref 39.0–75.0)
Platelets: 107 10*3/uL — ABNORMAL LOW (ref 140–400)
RBC: 4.27 10*6/uL (ref 4.20–5.82)
RDW: 14.4 % (ref 11.0–14.6)
WBC: 4.6 10*3/uL (ref 4.0–10.3)
nRBC: 0 % (ref 0–0)

## 2015-06-07 MED ORDER — SODIUM CHLORIDE 0.9 % IV SOLN
Freq: Once | INTRAVENOUS | Status: AC
  Administered 2015-06-07: 13:00:00 via INTRAVENOUS

## 2015-06-07 MED ORDER — SODIUM CHLORIDE 0.9 % IJ SOLN
10.0000 mL | INTRAMUSCULAR | Status: DC | PRN
  Administered 2015-06-07: 10 mL
  Filled 2015-06-07: qty 10

## 2015-06-07 MED ORDER — HEPARIN SOD (PORK) LOCK FLUSH 100 UNIT/ML IV SOLN
500.0000 [IU] | Freq: Once | INTRAVENOUS | Status: AC | PRN
  Administered 2015-06-07: 500 [IU]
  Filled 2015-06-07: qty 5

## 2015-06-07 MED ORDER — SODIUM CHLORIDE 0.9 % IJ SOLN
10.0000 mL | INTRAMUSCULAR | Status: DC | PRN
  Administered 2015-06-07: 10 mL via INTRAVENOUS
  Filled 2015-06-07: qty 10

## 2015-06-07 MED ORDER — ZOLEDRONIC ACID 4 MG/100ML IV SOLN
4.0000 mg | Freq: Once | INTRAVENOUS | Status: AC
  Administered 2015-06-07: 4 mg via INTRAVENOUS
  Filled 2015-06-07: qty 100

## 2015-06-07 NOTE — Progress Notes (Signed)
[name] OFFICE PROGRESS NOTE  Patient Care Team: Jinny Sanders, MD as PCP - General  SUMMARY OF ONCOLOGIC HISTORY:  Principle Diagnosis: Kappa light chain multiple myeloma, ISS III with diagnosis established in December 2012.  Prior Therapy:  1. Velcade, Cytoxan, and Decadron on a weekly basis from 07/10/2011 through 10/23/2011; 2. High-dose chemotherapy on 11/23/2011, consisting of carmustine 600 mg IV. He received cytarabine 400 mg IV on 12/24/2011, 12/25/2011, 12/26/2011, and 12/27/2011. He received etoposide 300 mg IV daily from 12/24/2011 through 12/27/2011, four doses. He received melphalan 280 mg IV on 12/28/2011.  3. 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  Current therapy: -Revlimid 5 mg daily, 3 weeks on, 1 week off. Revlimid was started on 05/11/2012.  Zometa was started on 09/02/2012.  Dental clearance was obtained.  Zometa 4 mg was placed on hold due to current osteomyelitis but restarted on 06/23/2013.    He also reports that his PSA is slowly rising and this may require future therapy by his urologist, Dr. Alinda Money.   INTERVAL HISTORY: Please see below for problem oriented charting. He had recent dental extraction several months ago but they are all well-healed. Denies new back pain. Denies recent infection.  REVIEW OF SYSTEMS:   Constitutional: Denies fevers, chills or abnormal weight loss Eyes: Denies blurriness of vision Ears, nose, mouth, throat, and face: Denies mucositis or sore throat Respiratory: Denies cough, dyspnea or wheezes Cardiovascular: Denies palpitation, chest discomfort or lower extremity swelling Gastrointestinal:  Denies nausea, heartburn or change in bowel habits Skin: Denies abnormal skin rashes Lymphatics: Denies new lymphadenopathy or easy bruising Neurological:Denies numbness, tingling or new weaknesses Behavioral/Psych: Mood is stable, no new changes  All other systems were reviewed with the patient and are  negative.  I have reviewed the past medical history, past surgical history, social history and family history with the patient and they are unchanged from previous note.  ALLERGIES:  is allergic to celebrex; cymbalta; meloxicam; percocet; vicodin; and lyrica.  MEDICATIONS:  Current Outpatient Prescriptions  Medication Sig Dispense Refill  . aspirin 81 MG tablet Take 81 mg by mouth daily.    . B Complex Vitamins (VITAMIN-B COMPLEX) TABS Take by mouth.    . Calcium Carbonate-Vitamin D 600-400 MG-UNIT per tablet Take by mouth.    . cholecalciferol (VITAMIN D) 1000 UNITS tablet Take 1,000 Units by mouth daily.    . fentaNYL (DURAGESIC - DOSED MCG/HR) 50 MCG/HR Place 1 patch (50 mcg total) onto the skin every 3 (three) days. 10 patch 0  . lenalidomide (REVLIMID) 5 MG capsule Take one capsule by mouth daily for 21 days, then off 7 days. 21 capsule 0  . lidocaine-prilocaine (EMLA) cream Apply topically as needed. Apply to port site one hour before treatment and cover with plastic wrap 30 g 2  . mupirocin ointment (BACTROBAN) 2 % Apply to wound after soaking BID 30 g 1  . NONFORMULARY OR COMPOUNDED ITEM Baclofn/amitrp/ketamn cream topically to feet TID prn for neuropathy    . pantoprazole (PROTONIX) 40 MG tablet Take 1 tablet (40 mg total) by mouth daily. 90 tablet 3   No current facility-administered medications for this visit.   Facility-Administered Medications Ordered in Other Visits  Medication Dose Route Frequency Provider Last Rate Last Dose  . heparin lock flush 100 unit/mL  500 Units Intracatheter Once PRN Heath Lark, MD      . sodium chloride 0.9 % injection 10 mL  10 mL Intracatheter PRN Heath Lark, MD      .  Zoledronic Acid (ZOMETA) 4 mg IVPB  4 mg Intravenous Once Heath Lark, MD 400 mL/hr at 06/07/15 1309 4 mg at 06/07/15 1309    PHYSICAL EXAMINATION: ECOG PERFORMANCE STATUS: 0 - Asymptomatic  Filed Vitals:   06/07/15 1225  BP: 136/73  Pulse: 68  Temp: 97.8 F (36.6 C)   Resp: 18   Filed Weights   06/07/15 1225  Weight: 195 lb 8 oz (88.678 kg)    GENERAL:alert, no distress and comfortable SKIN: skin color, texture, turgor are normal, no rashes or significant lesions EYES: normal, Conjunctiva are pink and non-injected, sclera clear OROPHARYNX:no exudate, no erythema and lips, buccal mucosa, and tongue normal  NECK: supple, thyroid normal size, non-tender, without nodularity LYMPH:  no palpable lymphadenopathy in the cervical, axillary or inguinal LUNGS: clear to auscultation and percussion with normal breathing effort HEART: regular rate & rhythm and no murmurs and no lower extremity edema ABDOMEN:abdomen soft, non-tender and normal bowel sounds Musculoskeletal:no cyanosis of digits and no clubbing  NEURO: alert & oriented x 3 with fluent speech, no focal motor/sensory deficits  LABORATORY DATA:  I have reviewed the data as listed    Component Value Date/Time   NA 141 06/07/2015 1211   NA 141 04/12/2014 1041   NA 138 09/30/2013 0945   K 4.1 06/07/2015 1211   K 4.0 04/12/2014 1041   K 4.3 09/30/2013 0945   CL 105 04/12/2014 1041   CL 108* 09/30/2013 0945   CL 110* 12/29/2012 1151   CO2 26 06/07/2015 1211   CO2 23 04/12/2014 1041   CO2 26 09/30/2013 0945   GLUCOSE 99 06/07/2015 1211   GLUCOSE 86 04/12/2014 1041   GLUCOSE 92 09/30/2013 0945   GLUCOSE 127* 12/29/2012 1151   BUN 19.9 06/07/2015 1211   BUN 26* 04/12/2014 1041   BUN 25* 09/30/2013 0945   CREATININE 1.2 06/07/2015 1211   CREATININE 1.27 04/12/2014 1041   CREATININE 1.16 09/30/2013 0945   CALCIUM 9.2 06/07/2015 1211   CALCIUM 9.3 04/12/2014 1041   CALCIUM 8.3* 09/30/2013 0945   PROT 6.0* 06/07/2015 1211   PROT 6.0 04/12/2014 1041   ALBUMIN 3.3* 06/07/2015 1211   ALBUMIN 3.4* 04/12/2014 1041   AST 22 06/07/2015 1211   AST 19 04/12/2014 1041   ALT 24 06/07/2015 1211   ALT 19 04/12/2014 1041   ALKPHOS 53 06/07/2015 1211   ALKPHOS 45 04/12/2014 1041   BILITOT 1.15  06/07/2015 1211   BILITOT 0.7 04/12/2014 1041   GFRNONAA >60 09/30/2013 0945   GFRNONAA 55* 10/13/2011 0817   GFRAA >60 09/30/2013 0945   GFRAA 64* 10/13/2011 0817    No results found for: SPEP, UPEP  Lab Results  Component Value Date   WBC 4.6 06/07/2015   NEUTROABS 2.1 06/07/2015   HGB 14.7 06/07/2015   HCT 42.5 06/07/2015   MCV 99.5* 06/07/2015   PLT 107* 06/07/2015      Chemistry      Component Value Date/Time   NA 141 06/07/2015 1211   NA 141 04/12/2014 1041   NA 138 09/30/2013 0945   K 4.1 06/07/2015 1211   K 4.0 04/12/2014 1041   K 4.3 09/30/2013 0945   CL 105 04/12/2014 1041   CL 108* 09/30/2013 0945   CL 110* 12/29/2012 1151   CO2 26 06/07/2015 1211   CO2 23 04/12/2014 1041   CO2 26 09/30/2013 0945   BUN 19.9 06/07/2015 1211   BUN 26* 04/12/2014 1041   BUN 25* 09/30/2013 0945  CREATININE 1.2 06/07/2015 1211   CREATININE 1.27 04/12/2014 1041   CREATININE 1.16 09/30/2013 0945      Component Value Date/Time   CALCIUM 9.2 06/07/2015 1211   CALCIUM 9.3 04/12/2014 1041   CALCIUM 8.3* 09/30/2013 0945   ALKPHOS 53 06/07/2015 1211   ALKPHOS 45 04/12/2014 1041   AST 22 06/07/2015 1211   AST 19 04/12/2014 1041   ALT 24 06/07/2015 1211   ALT 19 04/12/2014 1041   BILITOT 1.15 06/07/2015 1211   BILITOT 0.7 04/12/2014 1041      ASSESSMENT & PLAN:  Multiple myeloma in remission ([place]) We discussed treatment plan.  He was treated with bone marrow transplant but the effect did not last. Since he was placed on Revlimid, he achieved complete remission. He tolerated Revlimid at 5 mg well.  Will continue the same He will continue on 81 mg aspirin to prevent DVT. He will visit with dentist twice a year for dental checkup while on Zometa. He will remain on calcium with vitamin D supplement.  Thrombocytopenia ([place]) This is likely due to recent treatment. The patient denies recent history of bleeding such as epistaxis, hematuria or hematochezia. He is asymptomatic  from the low platelet count. I will observe for now.  he does not require transfusion now. I will continue the chemotherapy at current dose without dosage adjustment.  If the thrombocytopenia gets progressive worse in the future, I might have to delay his treatment or adjust the chemotherapy dose.    Chronic back pain He had kyphoplasty. He remained on Zometa. He is seeing [facility] for chronic pain management   Orders Placed This Encounter  Procedures  . SPEP & IFE with [facility]IG    Standing Status: Future     Number of Occurrences:      Standing Expiration Date: 07/11/2016  . Kappa/lambda light chains    Standing Status: Future     Number of Occurrences:      Standing Expiration Date: 07/11/2016   All questions were answered. The patient knows to call the clinic with any problems, questions or concerns. No barriers to learning was detected. I spent 15 minutes counseling the patient face to face. The total time spent in the appointment was 20 minutes and more than 50% was on counseling and review of test results     [facility], [place], MD 06/07/2015 1:18 PM

## 2015-06-07 NOTE — Assessment & Plan Note (Signed)
We discussed treatment plan.  He was treated with bone marrow transplant but the effect did not last. Since he was placed on Revlimid, he achieved complete remission. He tolerated Revlimid at 5 mg well.  Will continue the same He will continue on 81 mg aspirin to prevent DVT. He will visit with dentist twice a year for dental checkup while on Zometa. He will remain on calcium with vitamin D supplement.

## 2015-06-07 NOTE — Patient Instructions (Signed)

## 2015-06-07 NOTE — Telephone Encounter (Signed)
Appointments complete per 11/11 pof. Patient to get avs report and appointments in inf area.

## 2015-06-07 NOTE — Assessment & Plan Note (Signed)
This is likely due to recent treatment. The patient denies recent history of bleeding such as epistaxis, hematuria or hematochezia. He is asymptomatic from the low platelet count. I will observe for now.  he does not require transfusion now. I will continue the chemotherapy at current dose without dosage adjustment.  If the thrombocytopenia gets progressive worse in the future, I might have to delay his treatment or adjust the chemotherapy dose.

## 2015-06-07 NOTE — Patient Instructions (Signed)

## 2015-06-07 NOTE — Assessment & Plan Note (Signed)
He had kyphoplasty. He remained on Zometa. He is seeing [facility] for chronic pain management

## 2015-06-11 ENCOUNTER — Telehealth: Payer: Self-pay | Admitting: *Deleted

## 2015-06-11 LAB — SPEP & IFE WITH QIG
ALPHA-2-GLOBULIN: 0.7 g/dL (ref 0.5–0.9)
Albumin ELP: 3.7 g/dL — ABNORMAL LOW (ref 3.8–4.8)
Alpha-1-Globulin: 0.3 g/dL (ref 0.2–0.3)
Beta 2: 0.3 g/dL (ref 0.2–0.5)
Beta Globulin: 0.3 g/dL — ABNORMAL LOW (ref 0.4–0.6)
Gamma Globulin: 0.9 g/dL (ref 0.8–1.7)
IGG (IMMUNOGLOBIN G), SERUM: 1010 mg/dL (ref 650–1600)
IGM, SERUM: 22 mg/dL — AB (ref 41–251)
IgA: 134 mg/dL (ref 68–379)
TOTAL PROTEIN, SERUM ELECTROPHOR: 6.1 g/dL (ref 6.1–8.1)

## 2015-06-11 LAB — KAPPA/LAMBDA LIGHT CHAINS
KAPPA FREE LGHT CHN: 3.71 mg/dL — AB (ref 0.33–1.94)
Kappa:Lambda Ratio: 1.27 (ref 0.26–1.65)
LAMBDA FREE LGHT CHN: 2.93 mg/dL — AB (ref 0.57–2.63)

## 2015-06-11 LAB — BETA 2 MICROGLOBULIN, SERUM: Beta-2 Microglobulin: 3.1 mg/L — ABNORMAL HIGH (ref <=2.51)

## 2015-06-11 NOTE — Telephone Encounter (Signed)
Informed Rowe Robert, NP of results and faxed to her office at fax [phone].  She will cancel pt's lab appt there next week.   Informed pt of Dr. Calton Dach note below and that I sent results to [facility].  He verbalized understanding.

## 2015-06-11 NOTE — Telephone Encounter (Signed)
-----   Message from Heath Lark, MD sent at 06/11/2015  2:22 PM EST ----- Regarding: Labs Pls let him know test results still showed remission. The elevated light chains are related to his kidneys and not myeloma Per patient request can you let Dorthea Cove or Dr. Alvie Heidelberg know and whether they can cancel labs next week ----- Message -----    From: [facility] Interface    Sent: 06/07/2015  12:29 PM      To: Heath Lark, MD

## 2015-06-12 ENCOUNTER — Other Ambulatory Visit: Payer: Self-pay | Admitting: *Deleted

## 2015-06-12 MED ORDER — LENALIDOMIDE 5 MG PO CAPS: ORAL_CAPSULE | ORAL | Status: DC

## 2015-06-14 ENCOUNTER — Other Ambulatory Visit: Payer: Medicare Other

## 2015-06-14 ENCOUNTER — Ambulatory Visit: Payer: Medicare Other | Admitting: Hematology and Oncology

## 2015-06-19 ENCOUNTER — Telehealth: Payer: Self-pay | Admitting: *Deleted

## 2015-06-19 NOTE — Telephone Encounter (Signed)
Pt called.  States he still had appt scheduled for lab at [facility] this week.  He thought this nurse was going to cancel it.  He called and canceled it himself.  Informed pt I did fax labs to Eligah East, NP at [facility] last week and I spoke w/ her.   She said she was going to cancel his lab appt.Marland Kitchen  Apologized it hadn't been canceled, but they do have his lab results and he does not need to keep this appt.Marland Kitchen  He verbalized understanding.

## 2015-07-03 ENCOUNTER — Ambulatory Visit (INDEPENDENT_AMBULATORY_CARE_PROVIDER_SITE_OTHER): Payer: Medicare Other | Admitting: Podiatry

## 2015-07-03 ENCOUNTER — Encounter: Payer: Self-pay | Admitting: Podiatry

## 2015-07-03 VITALS — BP 113/75 | HR 80 | Resp 18

## 2015-07-03 DIAGNOSIS — L02619 Cutaneous abscess of unspecified foot: Secondary | ICD-10-CM | POA: Diagnosis not present

## 2015-07-03 DIAGNOSIS — L03119 Cellulitis of unspecified part of limb: Secondary | ICD-10-CM

## 2015-07-03 DIAGNOSIS — G629 Polyneuropathy, unspecified: Secondary | ICD-10-CM

## 2015-07-03 DIAGNOSIS — M79676 Pain in unspecified toe(s): Secondary | ICD-10-CM

## 2015-07-03 DIAGNOSIS — L84 Corns and callosities: Secondary | ICD-10-CM | POA: Diagnosis not present

## 2015-07-03 MED ORDER — AMOXICILLIN-POT CLAVULANATE 875-125 MG PO TABS: 1.0000 | ORAL_TABLET | Freq: Two times a day (BID) | ORAL | Status: DC

## 2015-07-03 MED ORDER — MUPIROCIN 2 % EX OINT: TOPICAL_OINTMENT | CUTANEOUS | Status: DC

## 2015-07-03 NOTE — Progress Notes (Signed)
He presents today with chief complaint of painful corns and calluses to the toes bilateral foot. He is also concerned about a hallux nail right with proximal erythema extending to the level of IP joint hallux right. He denies any trauma.  Objective: Vital signs are stable he is alert and oriented 3. Pulses are palpable bilateral. Reactive hyperkeratosis medial aspect of the second digit right foot medial aspect of the fifth digit left foot. Hallux right does demonstrate a normal nail plate no purulence and no malodor with what appears to be cellulitis to the proximal nail fold extending to the level that hallux interphalangeal joint.  Assessment: Cellulitis hallux right for keratomas bilateral.  Plan: Discussed etiology pathology conservative versus surgical therapies. Debrided all reactive hyperkeratoses placed him on Bactroban ointment after soaking twice daily in Epsom salts and warm water right foot. Also placed him on Augmentin. Follow up with him in 2-4 weeks.

## 2015-07-10 ENCOUNTER — Other Ambulatory Visit: Payer: Self-pay | Admitting: *Deleted

## 2015-07-10 MED ORDER — LENALIDOMIDE 5 MG PO CAPS: ORAL_CAPSULE | ORAL | Status: DC

## 2015-07-17 ENCOUNTER — Ambulatory Visit (INDEPENDENT_AMBULATORY_CARE_PROVIDER_SITE_OTHER): Payer: Medicare Other | Admitting: Podiatry

## 2015-07-17 ENCOUNTER — Ambulatory Visit: Payer: Medicare Other | Admitting: Podiatry

## 2015-07-17 ENCOUNTER — Encounter: Payer: Self-pay | Admitting: Podiatry

## 2015-07-17 VITALS — BP 114/73 | HR 78 | Resp 18

## 2015-07-17 DIAGNOSIS — L02619 Cutaneous abscess of unspecified foot: Secondary | ICD-10-CM

## 2015-07-17 DIAGNOSIS — L03119 Cellulitis of unspecified part of limb: Secondary | ICD-10-CM

## 2015-07-17 DIAGNOSIS — M79676 Pain in unspecified toe(s): Secondary | ICD-10-CM

## 2015-07-17 NOTE — Progress Notes (Signed)
He presents today for follow-up of the cellulitis to the hallux and second digit of the right foot. At this point the cellulitis has subsided from the foot and is located just on the proximal nail fold. All in all this appears to be healing much better and he feels better he states.  Objective: Vital signs are stable he is alert and oriented 3. Superficial ulceration medial aspect of the second digit of the right foot is present with mild cellulitis in this area and cellulitis to the proximal nail fold.  Assessment: Cellulitis second toe and hallux right.  Plan: I encouraged him to continue his antibiotics and continue dressing. I placed a large spacer between his first toe and second toe to help alleviate the irritation from the juxtaposition.

## 2015-07-19 ENCOUNTER — Ambulatory Visit (HOSPITAL_BASED_OUTPATIENT_CLINIC_OR_DEPARTMENT_OTHER): Payer: Medicare Other

## 2015-07-19 VITALS — BP 120/77 | HR 77 | Temp 98.0°F | Resp 18

## 2015-07-19 DIAGNOSIS — C9001 Multiple myeloma in remission: Secondary | ICD-10-CM | POA: Diagnosis not present

## 2015-07-19 DIAGNOSIS — Z95828 Presence of other vascular implants and grafts: Secondary | ICD-10-CM

## 2015-07-19 DIAGNOSIS — Z452 Encounter for adjustment and management of vascular access device: Secondary | ICD-10-CM

## 2015-07-19 MED ORDER — HEPARIN SOD (PORK) LOCK FLUSH 100 UNIT/ML IV SOLN
500.0000 [IU] | Freq: Once | INTRAVENOUS | Status: AC
  Administered 2015-07-19: 500 [IU] via INTRAVENOUS
  Filled 2015-07-19: qty 5

## 2015-07-19 MED ORDER — SODIUM CHLORIDE 0.9 % IJ SOLN
10.0000 mL | INTRAMUSCULAR | Status: DC | PRN
  Administered 2015-07-19: 10 mL via INTRAVENOUS
  Filled 2015-07-19: qty 10

## 2015-07-19 NOTE — Patient Instructions (Signed)

## 2015-07-25 ENCOUNTER — Other Ambulatory Visit: Payer: Self-pay | Admitting: Hematology and Oncology

## 2015-07-26 ENCOUNTER — Other Ambulatory Visit: Payer: Self-pay | Admitting: Hematology and Oncology

## 2015-07-28 HISTORY — PX: TOE AMPUTATION: SHX809

## 2015-08-07 ENCOUNTER — Ambulatory Visit (INDEPENDENT_AMBULATORY_CARE_PROVIDER_SITE_OTHER): Payer: Medicare Other | Admitting: Podiatry

## 2015-08-07 ENCOUNTER — Encounter: Payer: Self-pay | Admitting: Podiatry

## 2015-08-07 ENCOUNTER — Telehealth: Payer: Self-pay | Admitting: *Deleted

## 2015-08-07 VITALS — BP 119/64 | HR 83 | Temp 97.9°F | Resp 16

## 2015-08-07 DIAGNOSIS — L02619 Cutaneous abscess of unspecified foot: Secondary | ICD-10-CM | POA: Diagnosis not present

## 2015-08-07 DIAGNOSIS — L03119 Cellulitis of unspecified part of limb: Secondary | ICD-10-CM | POA: Diagnosis not present

## 2015-08-07 DIAGNOSIS — I739 Peripheral vascular disease, unspecified: Secondary | ICD-10-CM | POA: Diagnosis not present

## 2015-08-07 DIAGNOSIS — R0989 Other specified symptoms and signs involving the circulatory and respiratory systems: Secondary | ICD-10-CM

## 2015-08-07 MED ORDER — MUPIROCIN 2 % EX OINT: TOPICAL_OINTMENT | CUTANEOUS | Status: DC

## 2015-08-07 NOTE — Progress Notes (Signed)
[name] presents today for follow-up of his cellulitis and pain to his hallux and second digit bilateral foot right greater than left. He states they really haven't gotten any better.  Objective: Vital signs are stable he is alert and oriented 3 his completed his antibiotics and at this point his toes are still score is only painful. 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 Pulses are nonpalpable capillary fill time is sluggish to the lesser digits.  Assessment: Peripheral vascular disease I'm not convinced that the erythema present is associated with infection or more and inflammatory event prior to ischemia.  Plan: We are referring him to maintain vascular doctors and then I will follow-up with him after that.

## 2015-08-07 NOTE — Telephone Encounter (Addendum)
Dr. Milinda Pointer ordered B/L arterial dopplers for diminished pulses.  Faxed to [facility].  08/30/2015-PT CALLED FOR circulation testing results,[facility] said Dr. Milinda Pointer would have the results on Monday. I told pt I was faxing a request for the results and we would call with instructions once Dr. Milinda Pointer reviewed. Faxed a request for copy of complete doppler results.

## 2015-08-08 ENCOUNTER — Other Ambulatory Visit: Payer: Self-pay | Admitting: *Deleted

## 2015-08-08 MED ORDER — LENALIDOMIDE 5 MG PO CAPS: ORAL_CAPSULE | ORAL | Status: DC

## 2015-08-09 ENCOUNTER — Encounter: Payer: Self-pay | Admitting: Hematology and Oncology

## 2015-08-09 NOTE — Progress Notes (Signed)
[facility] revlimid was shipped 08/08/15 via [employer]

## 2015-08-13 ENCOUNTER — Other Ambulatory Visit (HOSPITAL_COMMUNITY): Payer: Self-pay | Admitting: Urology

## 2015-08-13 DIAGNOSIS — C61 Malignant neoplasm of prostate: Secondary | ICD-10-CM

## 2015-09-06 ENCOUNTER — Ambulatory Visit: Payer: Medicare Other

## 2015-09-06 ENCOUNTER — Other Ambulatory Visit: Payer: Self-pay | Admitting: *Deleted

## 2015-09-06 ENCOUNTER — Telehealth: Payer: Self-pay | Admitting: *Deleted

## 2015-09-06 ENCOUNTER — Other Ambulatory Visit (HOSPITAL_BASED_OUTPATIENT_CLINIC_OR_DEPARTMENT_OTHER): Payer: Medicare Other

## 2015-09-06 ENCOUNTER — Ambulatory Visit (HOSPITAL_BASED_OUTPATIENT_CLINIC_OR_DEPARTMENT_OTHER): Payer: Medicare Other | Admitting: Hematology and Oncology

## 2015-09-06 ENCOUNTER — Ambulatory Visit (HOSPITAL_BASED_OUTPATIENT_CLINIC_OR_DEPARTMENT_OTHER): Payer: Medicare Other

## 2015-09-06 ENCOUNTER — Telehealth: Payer: Self-pay | Admitting: Hematology and Oncology

## 2015-09-06 VITALS — BP 129/73 | HR 67 | Temp 97.4°F | Resp 17 | Ht 74.0 in | Wt 204.2 lb

## 2015-09-06 DIAGNOSIS — R972 Elevated prostate specific antigen [PSA]: Secondary | ICD-10-CM | POA: Diagnosis not present

## 2015-09-06 DIAGNOSIS — C9000 Multiple myeloma not having achieved remission: Secondary | ICD-10-CM

## 2015-09-06 DIAGNOSIS — R0989 Other specified symptoms and signs involving the circulatory and respiratory systems: Secondary | ICD-10-CM

## 2015-09-06 DIAGNOSIS — D61818 Other pancytopenia: Secondary | ICD-10-CM | POA: Diagnosis not present

## 2015-09-06 DIAGNOSIS — C61 Malignant neoplasm of prostate: Secondary | ICD-10-CM | POA: Diagnosis not present

## 2015-09-06 DIAGNOSIS — C9001 Multiple myeloma in remission: Secondary | ICD-10-CM

## 2015-09-06 DIAGNOSIS — Z8546 Personal history of malignant neoplasm of prostate: Secondary | ICD-10-CM | POA: Diagnosis not present

## 2015-09-06 DIAGNOSIS — G620 Drug-induced polyneuropathy: Secondary | ICD-10-CM

## 2015-09-06 DIAGNOSIS — T451X5A Adverse effect of antineoplastic and immunosuppressive drugs, initial encounter: Secondary | ICD-10-CM

## 2015-09-06 DIAGNOSIS — Z95828 Presence of other vascular implants and grafts: Secondary | ICD-10-CM

## 2015-09-06 LAB — CBC WITH DIFFERENTIAL/PLATELET
BASO%: 2.6 % — AB (ref 0.0–2.0)
Basophils Absolute: 0.1 10*3/uL (ref 0.0–0.1)
EOS%: 16.6 % — AB (ref 0.0–7.0)
Eosinophils Absolute: 0.7 10*3/uL — ABNORMAL HIGH (ref 0.0–0.5)
HCT: 45.4 % (ref 38.4–49.9)
HGB: 14.8 g/dL (ref 13.0–17.1)
LYMPH%: 25.5 % (ref 14.0–49.0)
MCH: 33.1 pg (ref 27.2–33.4)
MCHC: 32.5 g/dL (ref 32.0–36.0)
MCV: 101.9 fL — ABNORMAL HIGH (ref 79.3–98.0)
MONO#: 0.8 10*3/uL (ref 0.1–0.9)
MONO%: 20.6 % — ABNORMAL HIGH (ref 0.0–14.0)
NEUT%: 34.7 % — AB (ref 39.0–75.0)
NEUTROS ABS: 1.4 10*3/uL — AB (ref 1.5–6.5)
Platelets: 113 10*3/uL — ABNORMAL LOW (ref 140–400)
RBC: 4.45 10*6/uL (ref 4.20–5.82)
RDW: 14.4 % (ref 11.0–14.6)
WBC: 3.9 10*3/uL — AB (ref 4.0–10.3)
lymph#: 1 10*3/uL (ref 0.9–3.3)

## 2015-09-06 LAB — COMPREHENSIVE METABOLIC PANEL
ALT: 26 U/L (ref 0–55)
ANION GAP: 9 meq/L (ref 3–11)
AST: 24 U/L (ref 5–34)
Albumin: 3.2 g/dL — ABNORMAL LOW (ref 3.5–5.0)
Alkaline Phosphatase: 53 U/L (ref 40–150)
BUN: 16.3 mg/dL (ref 7.0–26.0)
CALCIUM: 8.6 mg/dL (ref 8.4–10.4)
CHLORIDE: 106 meq/L (ref 98–109)
CO2: 24 meq/L (ref 22–29)
CREATININE: 1.3 mg/dL (ref 0.7–1.3)
EGFR: 54 mL/min/{1.73_m2} — ABNORMAL LOW (ref >90)
Glucose: 94 mg/dl (ref 70–140)
POTASSIUM: 3.9 meq/L (ref 3.5–5.1)
Sodium: 138 mEq/L (ref 136–145)
Total Bilirubin: 1.14 mg/dL (ref 0.20–1.20)
Total Protein: 6.1 g/dL — ABNORMAL LOW (ref 6.4–8.3)

## 2015-09-06 MED ORDER — HEPARIN SOD (PORK) LOCK FLUSH 100 UNIT/ML IV SOLN
500.0000 [IU] | Freq: Once | INTRAVENOUS | Status: AC
  Administered 2015-09-06: 500 [IU] via INTRAVENOUS
  Filled 2015-09-06: qty 5

## 2015-09-06 MED ORDER — SODIUM CHLORIDE 0.9 % IJ SOLN
10.0000 mL | INTRAMUSCULAR | Status: DC | PRN
  Administered 2015-09-06: 10 mL
  Filled 2015-09-06: qty 10

## 2015-09-06 MED ORDER — SODIUM CHLORIDE 0.9% FLUSH
10.0000 mL | INTRAVENOUS | Status: DC | PRN
  Administered 2015-09-06: 10 mL via INTRAVENOUS
  Filled 2015-09-06: qty 10

## 2015-09-06 MED ORDER — HEPARIN SOD (PORK) LOCK FLUSH 100 UNIT/ML IV SOLN
500.0000 [IU] | Freq: Once | INTRAVENOUS | Status: AC | PRN
  Administered 2015-09-06: 500 [IU]
  Filled 2015-09-06: qty 5

## 2015-09-06 MED ORDER — ZOLEDRONIC ACID 4 MG/100ML IV SOLN
4.0000 mg | Freq: Once | INTRAVENOUS | Status: AC
  Administered 2015-09-06: 4 mg via INTRAVENOUS
  Filled 2015-09-06: qty 100

## 2015-09-06 MED ORDER — LENALIDOMIDE 5 MG PO CAPS: ORAL_CAPSULE | ORAL | Status: DC

## 2015-09-06 NOTE — Assessment & Plan Note (Signed)
He has mild worsening neuropathy in the wintertime. I will defer to [facility] for further evaluation and treatment

## 2015-09-06 NOTE — Patient Instructions (Signed)

## 2015-09-06 NOTE — Assessment & Plan Note (Signed)
This is likely due to recent treatment. The patient denies recent history of bleeding such as epistaxis, hematuria or hematochezia. He is asymptomatic from the low platelet count. I will observe for now.  he does not require transfusion now. I will continue the chemotherapy at current dose without dosage adjustment.

## 2015-09-06 NOTE — Telephone Encounter (Signed)
per pof to sch pt appt-pt to get updated sch on MY CHART per pt-sent MW email to get trmt added

## 2015-09-06 NOTE — Patient Instructions (Signed)

## 2015-09-06 NOTE — Assessment & Plan Note (Signed)
We discussed treatment plan.  He was treated with bone marrow transplant but the effect did not last. Since he was placed on Revlimid, he achieved complete remission. He tolerated Revlimid at 5 mg well.  Will continue the same He will continue on 81 mg aspirin to prevent DVT. He will visit with dentist twice a year for dental checkup while on Zometa. He will remain on calcium with vitamin D supplement.

## 2015-09-06 NOTE — Progress Notes (Signed)
[place] OFFICE PROGRESS NOTE  Patient Care Team: Jinny Sanders, MD as PCP - General Jeanann Lewandowsky, MD as Consulting Physician (Hematology and Oncology) Cathlean Marseilles, NP as Nurse Practitioner (Hematology and Oncology)  SUMMARY OF ONCOLOGIC HISTORY:  Principle Diagnosis: Kappa light chain multiple myeloma, ISS III with diagnosis established in December 2012.  Prior Therapy:  1. Velcade, Cytoxan, and Decadron on a weekly basis from 07/10/2011 through 10/23/2011; 2. High-dose chemotherapy on 11/23/2011, consisting of carmustine 600 mg IV. He received cytarabine 400 mg IV on 12/24/2011, 12/25/2011, 12/26/2011, and 12/27/2011. He received etoposide 300 mg IV daily from 12/24/2011 through 12/27/2011, four doses. He received melphalan 280 mg IV on 12/28/2011.  3. 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  Current therapy: -Revlimid 5 mg daily, 3 weeks on, 1 week off. Revlimid was started on 05/11/2012.  Zometa was started on 09/02/2012.  Dental clearance was obtained.  Zometa 4 mg was placed on hold due to current osteomyelitis but restarted on 06/23/2013.    He also reports that his PSA is slowly rising and this may require future therapy by his urologist, Dr. Alinda Money.   INTERVAL HISTORY: Please see below for problem oriented charting. He had one recent urinary tract infection, resolved with ciprofloxacin Denies new back pain.he complained of mild joint pain in his knees and his hips He complained of persistent neuropathy in his feet. He follows at [facility] for pain management The patient denies any recent signs or symptoms of bleeding such as spontaneous epistaxis, hematuria or hematochezia.   REVIEW OF SYSTEMS:   Constitutional: Denies fevers, chills or abnormal weight loss Eyes: Denies blurriness of vision Ears, nose, mouth, throat, and face: Denies mucositis or sore throat Respiratory: Denies cough, dyspnea or wheezes Cardiovascular: Denies  palpitation, chest discomfort or lower extremity swelling Gastrointestinal:  Denies nausea, heartburn or change in bowel habits Skin: Denies abnormal skin rashes Lymphatics: Denies new lymphadenopathy or easy bruising Neurological:Denies numbness, tingling or new weaknesses Behavioral/Psych: Mood is stable, no new changes  All other systems were reviewed with the patient and are negative.  I have reviewed the past medical history, past surgical history, social history and family history with the patient and they are unchanged from previous note.  ALLERGIES:  is allergic to celebrex; cymbalta; meloxicam; percocet; vicodin; and lyrica.  MEDICATIONS:  Current Outpatient Prescriptions  Medication Sig Dispense Refill  . aspirin 81 MG tablet Take 81 mg by mouth daily.    . B Complex Vitamins (VITAMIN-B COMPLEX) TABS Take by mouth.    . Calcium Carbonate-Vitamin D 600-400 MG-UNIT per tablet Take by mouth.    . cholecalciferol (VITAMIN D) 1000 UNITS tablet Take 1,000 Units by mouth daily.    . fentaNYL (DURAGESIC - DOSED MCG/HR) 50 MCG/HR Place 1 patch (50 mcg total) onto the skin every 3 (three) days. 10 patch 0  . lenalidomide (REVLIMID) 5 MG capsule Take one capsule by mouth daily for 21 days, then off 7 days. 21 capsule 0  . lidocaine-prilocaine (EMLA) cream Apply topically as needed. Apply to port site one hour before treatment and cover with plastic wrap 30 g 2  . mupirocin ointment (BACTROBAN) 2 % Apply to wound after soaking BID 30 g 1  . NONFORMULARY OR COMPOUNDED ITEM Baclofn/amitrp/ketamn cream topically to feet TID prn for neuropathy    . pantoprazole (PROTONIX) 40 MG tablet Take 1 tablet (40 mg total) by mouth daily. 90 tablet 3   No current facility-administered medications for this  visit.   Facility-Administered Medications Ordered in Other Visits  Medication Dose Route Frequency Provider Last Rate Last Dose  . sodium chloride flush (NS) 0.9 % injection 10 mL  10 mL Intravenous  PRN Heath Lark, MD   10 mL at 09/06/15 1217    PHYSICAL EXAMINATION: ECOG PERFORMANCE STATUS: 0 - Asymptomatic  Filed Vitals:   09/06/15 1230  BP: 129/73  Pulse: 67  Temp: 97.4 F (36.3 C)  Resp: 17   Filed Weights   09/06/15 1230  Weight: 204 lb 3.2 oz (92.625 kg)    GENERAL:alert, no distress and comfortable SKIN: skin color, texture, turgor are normal, no rashes or significant lesions EYES: normal, Conjunctiva are pink and non-injected, sclera clear OROPHARYNX:no exudate, no erythema and lips, buccal mucosa, and tongue normal  NECK: supple, thyroid normal size, non-tender, without nodularity LYMPH:  no palpable lymphadenopathy in the cervical, axillary or inguinal LUNGS: clear to auscultation and percussion with normal breathing effort HEART: regular rate & rhythm and no murmurs and no lower extremity edema ABDOMEN:abdomen soft, non-tender and normal bowel sounds Musculoskeletal:no cyanosis of digits and no clubbing  NEURO: alert & oriented x 3 with fluent speech, no focal motor/sensory deficits  LABORATORY DATA:  I have reviewed the data as listed    Component Value Date/Time   NA 141 06/07/2015 1211   NA 141 04/12/2014 1041   NA 138 09/30/2013 0945   K 4.1 06/07/2015 1211   K 4.0 04/12/2014 1041   K 4.3 09/30/2013 0945   CL 105 04/12/2014 1041   CL 108* 09/30/2013 0945   CL 110* 12/29/2012 1151   CO2 26 06/07/2015 1211   CO2 23 04/12/2014 1041   CO2 26 09/30/2013 0945   GLUCOSE 99 06/07/2015 1211   GLUCOSE 86 04/12/2014 1041   GLUCOSE 92 09/30/2013 0945   GLUCOSE 127* 12/29/2012 1151   BUN 19.9 06/07/2015 1211   BUN 26* 04/12/2014 1041   BUN 25* 09/30/2013 0945   CREATININE 1.2 06/07/2015 1211   CREATININE 1.27 04/12/2014 1041   CREATININE 1.16 09/30/2013 0945   CALCIUM 9.2 06/07/2015 1211   CALCIUM 9.3 04/12/2014 1041   CALCIUM 8.3* 09/30/2013 0945   PROT 6.0* 06/07/2015 1211   PROT 6.0 04/12/2014 1041   ALBUMIN 3.3* 06/07/2015 1211   ALBUMIN 3.4*  04/12/2014 1041   AST 22 06/07/2015 1211   AST 19 04/12/2014 1041   ALT 24 06/07/2015 1211   ALT 19 04/12/2014 1041   ALKPHOS 53 06/07/2015 1211   ALKPHOS 45 04/12/2014 1041   BILITOT 1.15 06/07/2015 1211   BILITOT 0.7 04/12/2014 1041   GFRNONAA >60 09/30/2013 0945   GFRNONAA 55* 10/13/2011 0817   GFRAA >60 09/30/2013 0945   GFRAA 64* 10/13/2011 0817    No results found for: SPEP, UPEP  Lab Results  Component Value Date   WBC 3.9* 09/06/2015   NEUTROABS 1.4* 09/06/2015   HGB 14.8 09/06/2015   HCT 45.4 09/06/2015   MCV 101.9* 09/06/2015   PLT 113* 09/06/2015      Chemistry      Component Value Date/Time   NA 141 06/07/2015 1211   NA 141 04/12/2014 1041   NA 138 09/30/2013 0945   K 4.1 06/07/2015 1211   K 4.0 04/12/2014 1041   K 4.3 09/30/2013 0945   CL 105 04/12/2014 1041   CL 108* 09/30/2013 0945   CL 110* 12/29/2012 1151   CO2 26 06/07/2015 1211   CO2 23 04/12/2014 1041   CO2 26  09/30/2013 0945   BUN 19.9 06/07/2015 1211   BUN 26* 04/12/2014 1041   BUN 25* 09/30/2013 0945   CREATININE 1.2 06/07/2015 1211   CREATININE 1.27 04/12/2014 1041   CREATININE 1.16 09/30/2013 0945      Component Value Date/Time   CALCIUM 9.2 06/07/2015 1211   CALCIUM 9.3 04/12/2014 1041   CALCIUM 8.3* 09/30/2013 0945   ALKPHOS 53 06/07/2015 1211   ALKPHOS 45 04/12/2014 1041   AST 22 06/07/2015 1211   AST 19 04/12/2014 1041   ALT 24 06/07/2015 1211   ALT 19 04/12/2014 1041   BILITOT 1.15 06/07/2015 1211   BILITOT 0.7 04/12/2014 1041     ASSESSMENT & PLAN:  Multiple myeloma in remission ([place]) We discussed treatment plan.  He was treated with bone marrow transplant but the effect did not last. Since he was placed on Revlimid, he achieved complete remission. He tolerated Revlimid at 5 mg well.  Will continue the same He will continue on 81 mg aspirin to prevent DVT. He will visit with dentist twice a year for dental checkup while on Zometa. He will remain on calcium with  vitamin D supplement.  Hx of NEOP, MALIGNANT, PROSTATE He had recent elevated PSA and is currently undergoing evaluation by urologist. I will defer to them for further management.  Acquired pancytopenia [facility]) This is likely due to recent treatment. The patient denies recent history of bleeding such as epistaxis, hematuria or hematochezia. He is asymptomatic from the low platelet count. I will observe for now.  he does not require transfusion now. I will continue the chemotherapy at current dose without dosage adjustment.   Neuropathy due to chemotherapeutic drug He has mild worsening neuropathy in the wintertime. I will defer to [facility] pain management for further evaluation and treatment   Orders Placed This Encounter  Procedures  . Multiple Myeloma Panel (SPEP&IFE w/QIG)    Standing Status: Future     Number of Occurrences:      Standing Expiration Date: 10/10/2016  . Kappa/lambda light chains    Standing Status: Future     Number of Occurrences:      Standing Expiration Date: 10/10/2016   All questions were answered. The patient knows to call the clinic with any problems, questions or concerns. No barriers to learning was detected. I spent 15 minutes counseling the patient face to face. The total time spent in the appointment was 20 minutes and more than 50% was on counseling and review of test results     [facility], [place], MD 09/06/2015 12:54 PM

## 2015-09-06 NOTE — Assessment & Plan Note (Signed)
He had recent elevated PSA and is currently undergoing evaluation by urologist. I will defer to them for further management.

## 2015-09-06 NOTE — Telephone Encounter (Signed)
Per staff message and POF I have scheduled appts. Advised scheduler of appts. JMW  

## 2015-09-09 LAB — KAPPA/LAMBDA LIGHT CHAINS
Ig Kappa Free Light Chain: 36.96 mg/L — ABNORMAL HIGH (ref 3.30–19.40)
Ig Lambda Free Light Chain: 30.88 mg/L — ABNORMAL HIGH (ref 5.71–26.30)
KAPPA/LAMBDA FLC RATIO: 1.2 (ref 0.26–1.65)

## 2015-09-10 ENCOUNTER — Encounter: Payer: Self-pay | Admitting: Hematology and Oncology

## 2015-09-10 LAB — MULTIPLE MYELOMA PANEL, SERUM
ALBUMIN/GLOB SERPL: 1.6 (ref 0.7–1.7)
ALPHA 1: 0.2 g/dL (ref 0.0–0.4)
ALPHA2 GLOB SERPL ELPH-MCNC: 0.5 g/dL (ref 0.4–1.0)
Albumin SerPl Elph-Mcnc: 3.4 g/dL (ref 2.9–4.4)
B-GLOBULIN SERPL ELPH-MCNC: 0.7 g/dL (ref 0.7–1.3)
GAMMA GLOB SERPL ELPH-MCNC: 0.8 g/dL (ref 0.4–1.8)
GLOBULIN, TOTAL: 2.2 g/dL (ref 2.2–3.9)
IGG (IMMUNOGLOBIN G), SERUM: 915 mg/dL (ref 700–1600)
IGM (IMMUNOGLOBIN M), SRM: 15 mg/dL — AB (ref 20–172)
IgA, Qn, Serum: 148 mg/dL (ref 61–437)
Total Protein: 5.6 g/dL — ABNORMAL LOW (ref 6.0–8.5)

## 2015-09-10 NOTE — Progress Notes (Signed)
Per [facility] revlimid was shipped via fedex 09/09/15

## 2015-09-11 ENCOUNTER — Encounter (HOSPITAL_COMMUNITY)
Admission: RE | Admit: 2015-09-11 | Discharge: 2015-09-11 | Disposition: A | Discharge status: Home / Self Care | Payer: Medicare Other | Source: Ambulatory Visit | Attending: Urology | Admitting: Urology

## 2015-09-11 ENCOUNTER — Ambulatory Visit: Payer: Medicare Other | Admitting: Podiatry

## 2015-09-11 DIAGNOSIS — C61 Malignant neoplasm of prostate: Secondary | ICD-10-CM | POA: Insufficient documentation

## 2015-09-11 MED ORDER — TECHNETIUM TC 99M MEDRONATE IV KIT
26.5000 | PACK | Freq: Once | INTRAVENOUS | Status: AC | PRN
  Administered 2015-09-11: 26.5 via INTRAVENOUS

## 2015-09-17 ENCOUNTER — Other Ambulatory Visit: Payer: Self-pay

## 2015-09-17 ENCOUNTER — Telehealth: Payer: Self-pay

## 2015-09-17 NOTE — Telephone Encounter (Signed)
Patient called requesting to be scheduled for a flush appt.  POF placed for week of 11/04/15.  His last flush was 09/06/15.  Patient was called and states understanding.

## 2015-09-19 ENCOUNTER — Telehealth: Payer: Self-pay | Admitting: Hematology and Oncology

## 2015-09-19 NOTE — Telephone Encounter (Signed)
Added flush for 4/7 per 2/21 pof. Left message for patient and also confirmed 5/19 appointments. Schedule mailed.

## 2015-10-07 ENCOUNTER — Telehealth: Payer: Self-pay | Admitting: *Deleted

## 2015-10-07 MED ORDER — LENALIDOMIDE 5 MG PO CAPS: ORAL_CAPSULE | ORAL | Status: DC

## 2015-10-07 NOTE — Telephone Encounter (Signed)
pls refill 

## 2015-10-07 NOTE — Telephone Encounter (Signed)
Pt LVM needs refill on Revlimid.

## 2015-10-07 NOTE — Telephone Encounter (Signed)
Notified pt of Refill Revlimid sent to [facility].

## 2015-10-14 ENCOUNTER — Telehealth: Payer: Self-pay | Admitting: *Deleted

## 2015-10-14 NOTE — Telephone Encounter (Signed)
"  I spoke with the [place] assistance program a few hours ago.  They say they sent forms for Dr. Alvy Bimler to sign about my diagnosis.  I'm calling to check on this.  I received shipment from [employer] but my co-pay assistance has stopped so I'm working with [employer].  Today I can be reached at home [phone].  Tomorrow mobile [phone].

## 2015-10-15 ENCOUNTER — Encounter: Payer: Self-pay | Admitting: Hematology and Oncology

## 2015-10-15 NOTE — Progress Notes (Unsigned)
Sending note to shawna to see if she got the form.see prev notes

## 2015-10-15 NOTE — Progress Notes (Signed)
Received message from [name]. Patient inquiring about re-enrollment copay assistance through [employer] and if we have received the paperwork from [employer]. I have not received any paperwork for the dr. to sign as of yet. I called [employer] and spoke with Sharyn Lull to find out if forms had been sent via fax. She states she was not able to see if they had been faxed or not but they would also be mailed to the patient. I provided my fax number and she was going to give to the representative that the patient applied with and should be sent out by the end of the business day today. Called patient to inform him what had taken place. I introduced myself as his Estate manager/land agent and advised him that I will contact him if I receive the forms and he will contact me if he receives by mail. Patient has my name for any additional questions or concerns.

## 2015-10-15 NOTE — Telephone Encounter (Signed)
Can someone help>?

## 2015-10-18 ENCOUNTER — Encounter: Payer: Self-pay | Admitting: *Deleted

## 2015-10-18 NOTE — Progress Notes (Signed)
Physician form completed and signed by Dr. Alvy Bimler.  Faxed form and insurance information to [employer] for co-pay assistance.  Faxed to [phone] on 3/23.

## 2015-11-04 ENCOUNTER — Encounter: Payer: Self-pay | Admitting: *Deleted

## 2015-11-04 ENCOUNTER — Ambulatory Visit (HOSPITAL_BASED_OUTPATIENT_CLINIC_OR_DEPARTMENT_OTHER): Payer: Medicare Other

## 2015-11-04 VITALS — BP 122/69 | HR 79 | Temp 97.6°F | Resp 18

## 2015-11-04 DIAGNOSIS — Z95828 Presence of other vascular implants and grafts: Secondary | ICD-10-CM

## 2015-11-04 DIAGNOSIS — Z452 Encounter for adjustment and management of vascular access device: Secondary | ICD-10-CM

## 2015-11-04 DIAGNOSIS — C9001 Multiple myeloma in remission: Secondary | ICD-10-CM

## 2015-11-04 MED ORDER — SODIUM CHLORIDE 0.9% FLUSH
10.0000 mL | INTRAVENOUS | Status: DC | PRN
  Administered 2015-11-04: 10 mL via INTRAVENOUS
  Filled 2015-11-04: qty 10

## 2015-11-04 MED ORDER — HEPARIN SOD (PORK) LOCK FLUSH 100 UNIT/ML IV SOLN
500.0000 [IU] | Freq: Once | INTRAVENOUS | Status: AC
  Administered 2015-11-04: 500 [IU] via INTRAVENOUS
  Filled 2015-11-04: qty 5

## 2015-11-04 NOTE — Patient Instructions (Signed)

## 2015-11-04 NOTE — Progress Notes (Signed)
Script for revlimid faxed to [facility] auth # [id number]

## 2015-11-05 ENCOUNTER — Other Ambulatory Visit: Payer: Self-pay | Admitting: *Deleted

## 2015-11-05 MED ORDER — LENALIDOMIDE 5 MG PO CAPS: ORAL_CAPSULE | ORAL | Status: DC

## 2015-11-28 ENCOUNTER — Encounter: Payer: Self-pay | Admitting: *Deleted

## 2015-11-29 ENCOUNTER — Other Ambulatory Visit: Payer: Self-pay | Admitting: *Deleted

## 2015-11-29 MED ORDER — LENALIDOMIDE 5 MG PO CAPS: ORAL_CAPSULE | ORAL | Status: DC

## 2015-12-03 ENCOUNTER — Encounter: Payer: Self-pay | Admitting: Hematology and Oncology

## 2015-12-03 NOTE — Progress Notes (Signed)
Per [employer] revlimid was shipped via fed ex 12/02/15

## 2015-12-04 ENCOUNTER — Telehealth: Payer: Self-pay | Admitting: Medical Oncology

## 2015-12-04 NOTE — Telephone Encounter (Signed)
[name] called stating that he is being followed by [name] for prostate cancer. He had prostatetcomy in 2012. He has been under active surveillance for a slow rising PSA. 09/11/15 his PSA had increased to 9.10. [name] is concerned and would like to start him on androgen deprivation therapy. He is not sure if this is compatible with the Revlimid and Zometa is is receiving to treat his myeloma. Per Dr. Alvy Bimler there is no contraindications and it is ok to begin.I informed patient and he voiced understanding. He has a PSA  drawn today and follows up with [name] 5/17 with injection.

## 2015-12-13 ENCOUNTER — Encounter: Payer: Self-pay | Admitting: Hematology and Oncology

## 2015-12-13 ENCOUNTER — Telehealth: Payer: Self-pay | Admitting: Hematology and Oncology

## 2015-12-13 ENCOUNTER — Ambulatory Visit (HOSPITAL_BASED_OUTPATIENT_CLINIC_OR_DEPARTMENT_OTHER): Payer: Medicare Other

## 2015-12-13 ENCOUNTER — Ambulatory Visit (HOSPITAL_BASED_OUTPATIENT_CLINIC_OR_DEPARTMENT_OTHER): Payer: Medicare Other | Admitting: Hematology and Oncology

## 2015-12-13 ENCOUNTER — Ambulatory Visit: Payer: Medicare Other

## 2015-12-13 ENCOUNTER — Other Ambulatory Visit (HOSPITAL_BASED_OUTPATIENT_CLINIC_OR_DEPARTMENT_OTHER): Payer: Medicare Other

## 2015-12-13 VITALS — BP 124/73 | HR 69 | Temp 97.7°F | Resp 18 | Ht 74.0 in | Wt 194.9 lb

## 2015-12-13 DIAGNOSIS — C9001 Multiple myeloma in remission: Secondary | ICD-10-CM

## 2015-12-13 DIAGNOSIS — C9000 Multiple myeloma not having achieved remission: Secondary | ICD-10-CM

## 2015-12-13 DIAGNOSIS — N183 Chronic kidney disease, stage 3 unspecified: Secondary | ICD-10-CM | POA: Insufficient documentation

## 2015-12-13 DIAGNOSIS — C61 Malignant neoplasm of prostate: Secondary | ICD-10-CM

## 2015-12-13 DIAGNOSIS — D61818 Other pancytopenia: Secondary | ICD-10-CM

## 2015-12-13 LAB — CBC WITH DIFFERENTIAL/PLATELET
BASO%: 3 % — ABNORMAL HIGH (ref 0.0–2.0)
Basophils Absolute: 0.1 10*3/uL (ref 0.0–0.1)
EOS ABS: 0.1 10*3/uL (ref 0.0–0.5)
EOS%: 4.2 % (ref 0.0–7.0)
HCT: 41.6 % (ref 38.4–49.9)
HGB: 14.6 g/dL (ref 13.0–17.1)
LYMPH%: 34.4 % (ref 14.0–49.0)
MCH: 34.3 pg — AB (ref 27.2–33.4)
MCHC: 35.1 g/dL (ref 32.0–36.0)
MCV: 97.7 fL (ref 79.3–98.0)
MONO#: 0.5 10*3/uL (ref 0.1–0.9)
MONO%: 14.5 % — AB (ref 0.0–14.0)
NEUT#: 1.5 10*3/uL (ref 1.5–6.5)
NEUT%: 43.9 % (ref 39.0–75.0)
PLATELETS: 98 10*3/uL — AB (ref 140–400)
RBC: 4.26 10*6/uL (ref 4.20–5.82)
RDW: 13.8 % (ref 11.0–14.6)
WBC: 3.4 10*3/uL — AB (ref 4.0–10.3)
lymph#: 1.2 10*3/uL (ref 0.9–3.3)

## 2015-12-13 LAB — COMPREHENSIVE METABOLIC PANEL
ALT: 31 U/L (ref 0–55)
ANION GAP: 6 meq/L (ref 3–11)
AST: 32 U/L (ref 5–34)
Albumin: 3.3 g/dL — ABNORMAL LOW (ref 3.5–5.0)
Alkaline Phosphatase: 56 U/L (ref 40–150)
BILIRUBIN TOTAL: 0.61 mg/dL (ref 0.20–1.20)
BUN: 14.9 mg/dL (ref 7.0–26.0)
CHLORIDE: 104 meq/L (ref 98–109)
CO2: 26 meq/L (ref 22–29)
Calcium: 8.6 mg/dL (ref 8.4–10.4)
Creatinine: 1.4 mg/dL — ABNORMAL HIGH (ref 0.7–1.3)
EGFR: 49 mL/min/{1.73_m2} — AB (ref >90)
Glucose: 125 mg/dl (ref 70–140)
POTASSIUM: 4 meq/L (ref 3.5–5.1)
Sodium: 136 mEq/L (ref 136–145)
TOTAL PROTEIN: 6.5 g/dL (ref 6.4–8.3)

## 2015-12-13 MED ORDER — ALTEPLASE 2 MG IJ SOLR
2.0000 mg | Freq: Once | INTRAMUSCULAR | Status: DC | PRN
  Filled 2015-12-13: qty 2

## 2015-12-13 MED ORDER — SODIUM CHLORIDE 0.9 % IJ SOLN
3.0000 mL | Freq: Once | INTRAMUSCULAR | Status: AC | PRN
  Administered 2015-12-13: 10 mL via INTRAVENOUS
  Filled 2015-12-13: qty 10

## 2015-12-13 MED ORDER — HEPARIN SOD (PORK) LOCK FLUSH 100 UNIT/ML IV SOLN
250.0000 [IU] | Freq: Once | INTRAVENOUS | Status: AC | PRN
  Administered 2015-12-13: 250 [IU]
  Filled 2015-12-13: qty 5

## 2015-12-13 MED ORDER — ZOLEDRONIC ACID 4 MG/100ML IV SOLN
4.0000 mg | Freq: Once | INTRAVENOUS | Status: AC
  Administered 2015-12-13: 4 mg via INTRAVENOUS
  Filled 2015-12-13: qty 100

## 2015-12-13 NOTE — Telephone Encounter (Signed)
Pt will get new sched in tx room

## 2015-12-13 NOTE — Assessment & Plan Note (Signed)
He has chronic kidney disease stage III. Continue risk factor modification We will continue Zometa but plan to reduce frequency of treatment to every 6 months in the future

## 2015-12-13 NOTE — Progress Notes (Signed)
[place] OFFICE PROGRESS NOTE  Patient Care Team: Jinny Sanders, MD as PCP - General Jeanann Lewandowsky, MD as Consulting Physician (Hematology and Oncology) Cathlean Marseilles, NP as Nurse Practitioner (Hematology and Oncology)  SUMMARY OF ONCOLOGIC HISTORY:  Principle Diagnosis: Kappa light chain multiple myeloma, ISS III with diagnosis established in December 2012.  Prior Therapy:  1. Velcade, Cytoxan, and Decadron on a weekly basis from 07/10/2011 through 10/23/2011; 2. High-dose chemotherapy on 11/23/2011, consisting of carmustine 600 mg IV. He received cytarabine 400 mg IV on 12/24/2011, 12/25/2011, 12/26/2011, and 12/27/2011. He received etoposide 300 mg IV daily from 12/24/2011 through 12/27/2011, four doses. He received melphalan 280 mg IV on 12/28/2011.  3. 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  Current therapy: -Revlimid 5 mg daily, 3 weeks on, 1 week off. Revlimid was started on 05/11/2012.  Zometa was started on 09/02/2012.  Dental clearance was obtained.  Zometa 4 mg was placed on hold due to current osteomyelitis but restarted on 06/23/2013.    He also reports that his PSA is slowly rising and this may require future therapy by his urologist, Dr. Alinda Money.   INTERVAL HISTORY: Please see below for problem oriented charting. Denies new back pain.he complained of mild joint pain in his knees and his hips He complained of persistent neuropathy in his feet. He follows at [facility] for pain management  The patient denies any recent signs or symptoms of bleeding such as spontaneous epistaxis, hematuria or hematochezia. He was noted to have elevated PSA recently. There is plan for possible further surgery. The patient requests a PSA to be drawn here in the near future. He denies dental issues  REVIEW OF SYSTEMS:   Constitutional: Denies fevers, chills or abnormal weight loss Eyes: Denies blurriness of vision Ears, nose, mouth, throat, and face:  Denies mucositis or sore throat Respiratory: Denies cough, dyspnea or wheezes Cardiovascular: Denies palpitation, chest discomfort or lower extremity swelling Gastrointestinal:  Denies nausea, heartburn or change in bowel habits Skin: Denies abnormal skin rashes Lymphatics: Denies new lymphadenopathy or easy bruising Neurological:Denies numbness, tingling or new weaknesses Behavioral/Psych: Mood is stable, no new changes  All other systems were reviewed with the patient and are negative.  I have reviewed the past medical history, past surgical history, social history and family history with the patient and they are unchanged from previous note.  ALLERGIES:  is allergic to celebrex; cymbalta; meloxicam; percocet; vicodin; and lyrica.  MEDICATIONS:  Current Outpatient Prescriptions  Medication Sig Dispense Refill  . aspirin 81 MG tablet Take 81 mg by mouth daily.    . B Complex Vitamins (VITAMIN-B COMPLEX) TABS Take by mouth.    . Calcium Carbonate-Vitamin D 600-400 MG-UNIT per tablet Take by mouth.    . cholecalciferol (VITAMIN D) 1000 UNITS tablet Take 1,000 Units by mouth daily.    . diazepam (VALIUM) 5 MG tablet TK 1/2 TO 1 T PO NIGHTLY PRF MUSCLE SPASMS  1  . fentaNYL (DURAGESIC - DOSED MCG/HR) 50 MCG/HR Place 1 patch (50 mcg total) onto the skin every 3 (three) days. 10 patch 0  . lenalidomide (REVLIMID) 5 MG capsule Take one capsule by mouth daily for 21 days, then off 7 days. 21 capsule 0  . mupirocin ointment (BACTROBAN) 2 % Apply to wound after soaking BID 30 g 1  . NONFORMULARY OR COMPOUNDED ITEM Baclofn/amitrp/ketamn cream topically to feet TID prn for neuropathy    . pantoprazole (PROTONIX) 40 MG tablet Take 1 tablet (40  mg total) by mouth daily. 90 tablet 3  . lidocaine-prilocaine (EMLA) cream Apply topically as needed. Apply to port site one hour before treatment and cover with plastic wrap (Patient not taking: Reported on 12/13/2015) 30 g 2  . oxyCODONE (OXY IR/ROXICODONE) 5  MG immediate release tablet Take by mouth. Reported on 12/13/2015    . oxyCODONE (OXYCONTIN) 20 mg 12 hr tablet Take by mouth. Reported on 12/13/2015    . prochlorperazine (COMPAZINE) 10 MG tablet Take by mouth. Reported on 12/13/2015     No current facility-administered medications for this visit.    PHYSICAL EXAMINATION: ECOG PERFORMANCE STATUS: 0 - Asymptomatic  Filed Vitals:   12/13/15 1206  BP: 124/73  Pulse: 69  Temp: 97.7 F (36.5 C)  Resp: 18   Filed Weights   12/13/15 1206  Weight: 194 lb 14.4 oz (88.406 kg)    GENERAL:alert, no distress and comfortable SKIN: skin color, texture, turgor are normal, no rashes or significant lesions EYES: normal, Conjunctiva are pink and non-injected, sclera clear OROPHARYNX:no exudate, no erythema and lips, buccal mucosa, and tongue normal  NECK: supple, thyroid normal size, non-tender, without nodularity LYMPH:  no palpable lymphadenopathy in the cervical, axillary or inguinal LUNGS: clear to auscultation and percussion with normal breathing effort HEART: regular rate & rhythm and no murmurs and no lower extremity edema ABDOMEN:abdomen soft, non-tender and normal bowel sounds Musculoskeletal:no cyanosis of digits and no clubbing  NEURO: alert & oriented x 3 with fluent speech, no focal motor/sensory deficits  LABORATORY DATA:  I have reviewed the data as listed    Component Value Date/Time   NA 136 12/13/2015 1149   NA 141 04/12/2014 1041   NA 138 09/30/2013 0945   K 4.0 12/13/2015 1149   K 4.0 04/12/2014 1041   K 4.3 09/30/2013 0945   CL 105 04/12/2014 1041   CL 108* 09/30/2013 0945   CL 110* 12/29/2012 1151   CO2 26 12/13/2015 1149   CO2 23 04/12/2014 1041   CO2 26 09/30/2013 0945   GLUCOSE 125 12/13/2015 1149   GLUCOSE 86 04/12/2014 1041   GLUCOSE 92 09/30/2013 0945   GLUCOSE 127* 12/29/2012 1151   BUN 14.9 12/13/2015 1149   BUN 26* 04/12/2014 1041   BUN 25* 09/30/2013 0945   CREATININE 1.4* 12/13/2015 1149    CREATININE 1.27 04/12/2014 1041   CREATININE 1.16 09/30/2013 0945   CALCIUM 8.6 12/13/2015 1149   CALCIUM 9.3 04/12/2014 1041   CALCIUM 8.3* 09/30/2013 0945   PROT 6.5 12/13/2015 1149   PROT 5.6* 09/06/2015 1211   PROT 6.0 04/12/2014 1041   ALBUMIN 3.3* 12/13/2015 1149   ALBUMIN 3.4* 04/12/2014 1041   AST 32 12/13/2015 1149   AST 19 04/12/2014 1041   ALT 31 12/13/2015 1149   ALT 19 04/12/2014 1041   ALKPHOS 56 12/13/2015 1149   ALKPHOS 45 04/12/2014 1041   BILITOT 0.61 12/13/2015 1149   BILITOT 0.7 04/12/2014 1041   GFRNONAA >60 09/30/2013 0945   GFRNONAA 55* 10/13/2011 0817   GFRAA >60 09/30/2013 0945   GFRAA 64* 10/13/2011 0817    No results found for: SPEP, UPEP  Lab Results  Component Value Date   WBC 3.4* 12/13/2015   NEUTROABS 1.5 12/13/2015   HGB 14.6 12/13/2015   HCT 41.6 12/13/2015   MCV 97.7 12/13/2015   PLT 98* 12/13/2015      Chemistry      Component Value Date/Time   NA 136 12/13/2015 1149   NA 141 04/12/2014  1041   NA 138 09/30/2013 0945   K 4.0 12/13/2015 1149   K 4.0 04/12/2014 1041   K 4.3 09/30/2013 0945   CL 105 04/12/2014 1041   CL 108* 09/30/2013 0945   CL 110* 12/29/2012 1151   CO2 26 12/13/2015 1149   CO2 23 04/12/2014 1041   CO2 26 09/30/2013 0945   BUN 14.9 12/13/2015 1149   BUN 26* 04/12/2014 1041   BUN 25* 09/30/2013 0945   CREATININE 1.4* 12/13/2015 1149   CREATININE 1.27 04/12/2014 1041   CREATININE 1.16 09/30/2013 0945      Component Value Date/Time   CALCIUM 8.6 12/13/2015 1149   CALCIUM 9.3 04/12/2014 1041   CALCIUM 8.3* 09/30/2013 0945   ALKPHOS 56 12/13/2015 1149   ALKPHOS 45 04/12/2014 1041   AST 32 12/13/2015 1149   AST 19 04/12/2014 1041   ALT 31 12/13/2015 1149   ALT 19 04/12/2014 1041   BILITOT 0.61 12/13/2015 1149   BILITOT 0.7 04/12/2014 1041      ASSESSMENT & PLAN:  Multiple myeloma in remission ([place]) We discussed treatment plan.  He was treated with bone marrow transplant but the effect did not  last. Since he was placed on Revlimid, he achieved complete remission. He tolerated Revlimid at 5 mg well.  Will continue the same He will continue on 81 mg aspirin to prevent DVT. He will visit with dentist twice a year for dental checkup while on Zometa. He will remain on calcium with vitamin D supplement. After today's dose of Zometa, I plan to lengthen treatment to every 6 months  Hx of NEOP, MALIGNANT, PROSTATE He had recent elevated PSA and is currently undergoing evaluation by urologist. I will defer to them for further management. In 3 months, when he returned, we will get PSA drawn at the same time.    Acquired pancytopenia [facility]) This is likely due to recent treatment. The patient denies recent history of bleeding such as epistaxis, hematuria or hematochezia. He is asymptomatic from the low platelet count. I will observe for now.  he does not require transfusion now. I will continue the chemotherapy at current dose without dosage adjustment.     Chronic kidney disease, stage III ([place]) He has chronic kidney disease stage III. Continue risk factor modification We will continue Zometa but plan to reduce frequency of treatment to every 6 months in the future   Orders Placed This Encounter  Procedures  . Kappa/lambda light chains    Standing Status: Future     Number of Occurrences:      Standing Expiration Date: 01/16/2017  . Multiple Myeloma Panel (SPEP&IFE w/QIG)    Standing Status: Future     Number of Occurrences:      Standing Expiration Date: 01/16/2017   All questions were answered. The patient knows to call the clinic with any problems, questions or concerns. No barriers to learning was detected. I spent 20 minutes counseling the patient face to face. The total time spent in the appointment was 25 minutes and more than 50% was on counseling and review of test results     [facility], [place], MD 12/13/2015 5:23 PM

## 2015-12-13 NOTE — Assessment & Plan Note (Signed)
He had recent elevated PSA and is currently undergoing evaluation by urologist. I will defer to them for further management. In 3 months, when he returned, we will get PSA drawn at the same time.

## 2015-12-13 NOTE — Assessment & Plan Note (Signed)
This is likely due to recent treatment. The patient denies recent history of bleeding such as epistaxis, hematuria or hematochezia. He is asymptomatic from the low platelet count. I will observe for now.  he does not require transfusion now. I will continue the chemotherapy at current dose without dosage adjustment.

## 2015-12-13 NOTE — Assessment & Plan Note (Signed)
We discussed treatment plan.  He was treated with bone marrow transplant but the effect did not last. Since he was placed on Revlimid, he achieved complete remission. He tolerated Revlimid at 5 mg well.  Will continue the same He will continue on 81 mg aspirin to prevent DVT. He will visit with dentist twice a year for dental checkup while on Zometa. He will remain on calcium with vitamin D supplement. After today's dose of Zometa, I plan to lengthen treatment to every 6 months

## 2015-12-13 NOTE — Patient Instructions (Signed)

## 2015-12-13 NOTE — Patient Instructions (Signed)

## 2015-12-16 LAB — KAPPA/LAMBDA LIGHT CHAINS
IG KAPPA FREE LIGHT CHAIN: 34.02 mg/L — AB (ref 3.30–19.40)
IG LAMBDA FREE LIGHT CHAIN: 29.99 mg/L — AB (ref 5.71–26.30)
KAPPA/LAMBDA FLC RATIO: 1.13 (ref 0.26–1.65)

## 2015-12-17 ENCOUNTER — Telehealth: Payer: Self-pay | Admitting: *Deleted

## 2015-12-17 DIAGNOSIS — C61 Malignant neoplasm of prostate: Secondary | ICD-10-CM

## 2015-12-17 NOTE — Telephone Encounter (Signed)
[facility] and s/w Dr. Lynne Logan nurse, Nira Conn.  Informed her of office note from Dr. Alinda Money states he wants repeat PSA in 3 months.  Pt is scheduled for labs here in 3 months on 8/18 and Dr. Alvy Bimler will be happy to order the PSA and fax results to Dr. Alinda Money.   Nira Conn confirms pt needs regular PSA only, no other labs. for Dr. Alinda Money and they would be appreciative of it being done in our office as discussed.

## 2015-12-18 LAB — MULTIPLE MYELOMA PANEL, SERUM
ALBUMIN SERPL ELPH-MCNC: 3.5 g/dL (ref 2.9–4.4)
Albumin/Glob SerPl: 1.5 (ref 0.7–1.7)
Alpha 1: 0.2 g/dL (ref 0.0–0.4)
Alpha2 Glob SerPl Elph-Mcnc: 0.7 g/dL (ref 0.4–1.0)
B-Globulin SerPl Elph-Mcnc: 0.8 g/dL (ref 0.7–1.3)
GAMMA GLOB SERPL ELPH-MCNC: 0.7 g/dL (ref 0.4–1.8)
GLOBULIN, TOTAL: 2.4 g/dL (ref 2.2–3.9)
IGA/IMMUNOGLOBULIN A, SERUM: 169 mg/dL (ref 61–437)
IGM (IMMUNOGLOBIN M), SRM: 25 mg/dL (ref 20–172)
IgG, Qn, Serum: 864 mg/dL (ref 700–1600)
TOTAL PROTEIN: 5.9 g/dL — AB (ref 6.0–8.5)

## 2015-12-25 ENCOUNTER — Other Ambulatory Visit: Payer: Self-pay | Admitting: *Deleted

## 2015-12-25 ENCOUNTER — Other Ambulatory Visit: Payer: Self-pay | Admitting: Hematology and Oncology

## 2015-12-25 ENCOUNTER — Telehealth: Payer: Self-pay | Admitting: *Deleted

## 2015-12-25 MED ORDER — LENALIDOMIDE 5 MG PO CAPS: ORAL_CAPSULE | ORAL | Status: DC

## 2015-12-25 NOTE — Telephone Encounter (Signed)
Pt states he needs refill of Revlimid

## 2016-01-13 ENCOUNTER — Encounter: Payer: Self-pay | Admitting: Podiatry

## 2016-01-13 ENCOUNTER — Ambulatory Visit (INDEPENDENT_AMBULATORY_CARE_PROVIDER_SITE_OTHER): Payer: Medicare Other | Admitting: Podiatry

## 2016-01-13 DIAGNOSIS — Q828 Other specified congenital malformations of skin: Secondary | ICD-10-CM | POA: Diagnosis not present

## 2016-01-13 DIAGNOSIS — B351 Tinea unguium: Secondary | ICD-10-CM

## 2016-01-13 DIAGNOSIS — M79676 Pain in unspecified toe(s): Secondary | ICD-10-CM

## 2016-01-13 NOTE — Progress Notes (Signed)
Presents today with chief complaint of painful elongated toenails. He also has multiple calluses.  Objective: Vital signs are stable alert 3. Pulses are palpable. Toenails are thick yellow dystrophic with mycotic grade 5 keratomas to the toes and plantar aspect bilateral foot.  Assessment: Pain in limb secondary to onychomycosis and hyperkeratosis.  Plan: Debridement of 4 keratomas and toenails 1 through 5 bilateral.

## 2016-01-21 ENCOUNTER — Other Ambulatory Visit: Payer: Self-pay | Admitting: *Deleted

## 2016-01-21 ENCOUNTER — Telehealth: Payer: Self-pay | Admitting: *Deleted

## 2016-01-21 MED ORDER — LENALIDOMIDE 5 MG PO CAPS: ORAL_CAPSULE | ORAL | Status: DC

## 2016-01-21 NOTE — Telephone Encounter (Signed)
"  I will taking my last Revlimid pill today and will need to restart the next round on January 29, 2016.  Please let Dr. Alvy Bimler nurse know to order the refill."

## 2016-01-21 NOTE — Telephone Encounter (Signed)
pls refill 

## 2016-01-21 NOTE — Telephone Encounter (Signed)
Collaborative nurse refilled at 0949 am.

## 2016-01-22 ENCOUNTER — Telehealth: Payer: Self-pay | Admitting: *Deleted

## 2016-01-22 NOTE — Telephone Encounter (Signed)
Pt requests refill on Revlimid.  Informed him of Rx sent to [facility] yesterday.  He verbalized understanding.

## 2016-01-24 ENCOUNTER — Ambulatory Visit (HOSPITAL_BASED_OUTPATIENT_CLINIC_OR_DEPARTMENT_OTHER): Payer: Medicare Other

## 2016-01-24 ENCOUNTER — Other Ambulatory Visit (INDEPENDENT_AMBULATORY_CARE_PROVIDER_SITE_OTHER): Payer: Medicare Other

## 2016-01-24 ENCOUNTER — Telehealth: Payer: Self-pay | Admitting: Family Medicine

## 2016-01-24 ENCOUNTER — Ambulatory Visit (INDEPENDENT_AMBULATORY_CARE_PROVIDER_SITE_OTHER): Payer: Medicare Other

## 2016-01-24 VITALS — BP 118/70 | HR 65 | Temp 97.7°F | Ht 73.0 in | Wt 189.5 lb

## 2016-01-24 DIAGNOSIS — M810 Age-related osteoporosis without current pathological fracture: Secondary | ICD-10-CM

## 2016-01-24 DIAGNOSIS — E781 Pure hyperglyceridemia: Secondary | ICD-10-CM | POA: Diagnosis not present

## 2016-01-24 DIAGNOSIS — Z1159 Encounter for screening for other viral diseases: Secondary | ICD-10-CM

## 2016-01-24 DIAGNOSIS — Z0000 Encounter for general adult medical examination without abnormal findings: Secondary | ICD-10-CM

## 2016-01-24 DIAGNOSIS — C9001 Multiple myeloma in remission: Secondary | ICD-10-CM | POA: Diagnosis present

## 2016-01-24 DIAGNOSIS — Z95828 Presence of other vascular implants and grafts: Secondary | ICD-10-CM

## 2016-01-24 DIAGNOSIS — Z13220 Encounter for screening for lipoid disorders: Secondary | ICD-10-CM

## 2016-01-24 LAB — VITAMIN D 25 HYDROXY (VIT D DEFICIENCY, FRACTURES): VITD: 49.27 ng/mL (ref 30.00–100.00)

## 2016-01-24 LAB — LIPID PANEL
CHOL/HDL RATIO: 3
Cholesterol: 151 mg/dL (ref 0–200)
HDL: 48.7 mg/dL (ref >39.00)
LDL CALC: 90 mg/dL (ref 0–99)
NonHDL: 102.02
Triglycerides: 61 mg/dL (ref 0.0–149.0)
VLDL: 12.2 mg/dL (ref 0.0–40.0)

## 2016-01-24 MED ORDER — SODIUM CHLORIDE 0.9% FLUSH
10.0000 mL | INTRAVENOUS | Status: DC | PRN
  Administered 2016-01-24: 10 mL via INTRAVENOUS
  Filled 2016-01-24: qty 10

## 2016-01-24 MED ORDER — HEPARIN SOD (PORK) LOCK FLUSH 100 UNIT/ML IV SOLN
500.0000 [IU] | Freq: Once | INTRAVENOUS | Status: AC
  Administered 2016-01-24: 500 [IU] via INTRAVENOUS
  Filled 2016-01-24: qty 5

## 2016-01-24 NOTE — Telephone Encounter (Signed)
Hep c oredered.

## 2016-01-24 NOTE — Addendum Note (Signed)
Addended by: Royann Shivers A on: 01/24/2016 02:07 PM   Modules accepted: Orders

## 2016-01-24 NOTE — Patient Instructions (Signed)
Mr. Lankford , Thank you for taking time to come for your Medicare Wellness Visit. I appreciate your ongoing commitment to your health goals. Please review the following plan we discussed and let me know if I can assist you in the future.   These are the goals we discussed: Goals    . Increase water intake     Starting 01/24/2016, I will attempt to drink at least 6 glasses of water daily.        This is a list of the screening recommended for you and due dates:  Health Maintenance  Topic Date Due  . Pneumonia vaccines (2 of 2 - PPSV23) 01/31/2016*  . Shingles Vaccine  01/23/2029*  . Flu Shot  02/25/2016  . Tetanus Vaccine  08/02/2016  . Colon Cancer Screening  09/17/2016  .  Hepatitis C: One time screening is recommended by Center for Disease Control  (CDC) for  adults born from [birth year] through 1965.   Completed  *Topic was postponed. The date shown is not the original due date.    Preventive Care for Adults  A healthy lifestyle and preventive care can promote health and wellness. Preventive health guidelines for adults include the following key practices.  . A routine yearly physical is a good way to check with your health care provider about your health and preventive screening. It is a chance to share any concerns and updates on your health and to receive a thorough exam.  . Visit your dentist for a routine exam and preventive care every 6 months. Brush your teeth twice a day and floss once a day. Good oral hygiene prevents tooth decay and gum disease.  . The frequency of eye exams is based on your age, health, family medical history, use  of contact lenses, and other factors. Follow your health care provider's ecommendations for frequency of eye exams.  . Eat a healthy diet. Foods like vegetables, fruits, whole grains, low-fat dairy products, and lean protein foods contain the nutrients you need without too many calories. Decrease your intake of foods high in solid fats, added  sugars, and salt. Eat the right amount of calories for you. Get information about a proper diet from your health care provider, if necessary.  . Regular physical exercise is one of the most important things you can do for your health. Most adults should get at least 150 minutes of moderate-intensity exercise (any activity that increases your heart rate and causes you to sweat) each week. In addition, most adults need muscle-strengthening exercises on 2 or more days a week.  Silver Sneakers may be a benefit available to you. To determine eligibility, you may visit the website: www.silversneakers.com or contact program at 509-002-8227 Mon-Fri between 8AM-8PM.   . Maintain a healthy weight. The body mass index (BMI) is a screening tool to identify possible weight problems. It provides an estimate of body fat based on height and weight. Your health care provider can find your BMI and can help you achieve or maintain a healthy weight.   For adults 20 years and older: ? A BMI below 18.5 is considered underweight. ? A BMI of 18.5 to 24.9 is normal. ? A BMI of 25 to 29.9 is considered overweight. ? A BMI of 30 and above is considered obese.   . Maintain normal blood lipids and cholesterol levels by exercising and minimizing your intake of saturated fat. Eat a balanced diet with plenty of fruit and vegetables. Blood tests for lipids and cholesterol should  begin at age 27 and be repeated every 5 years. If your lipid or cholesterol levels are high, you are over 50, or you are at high risk for heart disease, you may need your cholesterol levels checked more frequently. Ongoing high lipid and cholesterol levels should be treated with medicines if diet and exercise are not working.  . If you smoke, find out from your health care provider how to quit. If you do not use tobacco, please do not start.  . If you choose to drink alcohol, please do not consume more than 2 drinks per day. One drink is considered to  be 12 ounces (355 mL) of beer, 5 ounces (148 mL) of wine, or 1.5 ounces (44 mL) of liquor.  . If you are 39-75 years old, ask your health care provider if you should take aspirin to prevent strokes.  . Use sunscreen. Apply sunscreen liberally and repeatedly throughout the day. You should seek shade when your shadow is shorter than you. Protect yourself by wearing long sleeves, pants, a wide-brimmed hat, and sunglasses year round, whenever you are outdoors.  . Once a month, do a whole body skin exam, using a mirror to look at the skin on your back. Tell your health care provider of new moles, moles that have irregular borders, moles that are larger than a pencil eraser, or moles that have changed in shape or color.

## 2016-01-24 NOTE — Telephone Encounter (Signed)
-----   Message from Marchia Bond sent at 01/17/2016  1:45 PM EDT ----- Regarding: Cpx labs Fri 6/30, need orders. Thanks! :-) Please order  future cpx labs for pt's upcoming lab appt. Thanks Aniceto Boss

## 2016-01-25 LAB — HEPATITIS C ANTIBODY: HCV AB: NEGATIVE

## 2016-01-26 NOTE — Progress Notes (Signed)
Subjective:   [name] is a 70 y.o. male who presents for an Initial Medicare Annual Wellness Visit.  Review of Systems  N/A Cardiac Risk Factors include: advanced age (>72mn, >>63women);male gender;sedentary lifestyle    Objective:    Today's Vitals   01/24/16 1002 01/24/16 1005  BP: 118/70   Pulse: 65   Temp: 97.7 F (36.5 C)   TempSrc: Oral   Height: 6' 1" (1.854 m)   Weight: 189 lb 8 oz (85.957 kg)   SpO2: 98%   PainSc: 2  2   PainLoc: Back    Body mass index is 25.01 kg/(m^2).  Current Medications (verified) Outpatient Encounter Prescriptions as of 01/24/2016  Medication Sig  . aspirin 81 MG tablet Take 81 mg by mouth daily.  . B Complex Vitamins (VITAMIN-B COMPLEX) TABS Take by mouth.  . Calcium Carbonate-Vitamin D 600-400 MG-UNIT per tablet Take by mouth.  . cholecalciferol (VITAMIN D) 1000 UNITS tablet Take 1,000 Units by mouth daily.  . fentaNYL (DURAGESIC - DOSED MCG/HR) 50 MCG/HR Place 1 patch (50 mcg total) onto the skin every 3 (three) days.  .Marland Kitchenlenalidomide (REVLIMID) 5 MG capsule Take one capsule by mouth daily for 21 days, then off 7 days.  .Marland Kitchenlidocaine-prilocaine (EMLA) cream Apply topically as needed. Apply to port site one hour before treatment and cover with plastic wrap  . NONFORMULARY OR COMPOUNDED ITEM Baclofn/amitrp/ketamn cream topically to feet TID prn for neuropathy  . pantoprazole (PROTONIX) 40 MG tablet Take 40 mg by mouth daily.  . [DISCONTINUED] diazepam (VALIUM) 5 MG tablet TK 1/2 TO 1 T PO NIGHTLY PRF MUSCLE SPASMS  . [DISCONTINUED] mupirocin ointment (BACTROBAN) 2 % Apply to wound after soaking BID  . [DISCONTINUED] oxyCODONE (OXY IR/ROXICODONE) 5 MG immediate release tablet Take by mouth. Reported on 12/13/2015  . [DISCONTINUED] pantoprazole (PROTONIX) 40 MG tablet Take 1 tablet (40 mg total) by mouth daily.  . [DISCONTINUED] prochlorperazine (COMPAZINE) 10 MG tablet Take by mouth. Reported on 12/13/2015   No facility-administered  encounter medications on file as of 01/24/2016.    Allergies (verified) Celebrex; Cymbalta; Meloxicam; Oxycontin; Percocet; Vicodin; and Lyrica   History: Past Medical History  Diagnosis Date  . HTN (hypertension)   . Allergic rhinitis   . Hyperlipemia   . Hypercalcemia of malignancy   . Multiple myeloma 07/09/2011  . [place] [facility]   . Peripheral neuropathy (HCC)     toes, sees Dr YKrista Blue . Multiple myeloma   . Fractures     compression T12,L3  . Anemia 07/2010    secondary to tx rsponsive to [facility]  . [place]state cancer ([facility] [place]    s/p prostatectomy  . Prostate cancer ([facility] 03/2002  . Renal insufficiency   . History of chemotherapy     weekly Velcade,Cytoxan  . 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 DDD (degenerative disc disease)    Past Surgical History  Procedure Laterality Date  . Kidney stone surgery    . Kidney stone surgery  06/2009    Stone removed from bladder  . Dexa  05/2011    spine 0.7, L femur 0.0, R femur -0.6, normal  . Prostatectomy  04/06/07  . Portacath placement  08/05/11    right sided portacath   Family History  Problem Relation Age of Onset  . Pneumonia Mother   . Stroke Father   . Cancer Brother     lung  .  Kidney cancer Sister    Social History   Occupational History  . 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 Smoking status: Former Smoker -- 2.00 packs/day for 30 years    Quit date: 07/27/1996  . Smokeless tobacco: Never Used  . Alcohol Use: No  . Drug Use: No  . Sexual Activity: No   Tobacco Counseling Counseling given: No   Activities of Daily Living In your present state of health, do you have any difficulty performing the following activities: 01/24/2016  Hearing? N  Vision? N  Difficulty concentrating or making decisions? N  Walking or climbing stairs? N  Dressing or bathing? N  Doing errands, shopping? N  Preparing Food and eating ? N    Using the Toilet? N  In the past six months, have you accidently leaked urine? N  Do you have problems with loss of bowel control? N  Managing your Medications? N  Managing your Finances? N  Housekeeping or managing your Housekeeping? N    Immunizations and Health Maintenance Immunization History  Administered Date(s) Administered  . DTaP 05/19/2013  . Influenza Split 04/13/2012, 04/18/2013, 04/11/2015  . Influenza Whole 06/03/2008  . Influenza, High Dose Seasonal PF 04/11/2015  . Influenza,inj,Quad PF,36+ Mos 04/12/2014  . Pneumococcal Conjugate-13 05/18/2014  . Td 08/02/2006   There are no preventive care reminders to display for this patient.  Patient Care Team: Jinny Sanders, MD as PCP - General Jeanann Lewandowsky, MD as Consulting Physician (Hematology and Oncology) Cathlean Marseilles, NP as Nurse Practitioner (Hematology and Oncology) Raynelle Bring, MD as Consulting Physician (Urology) Heath Lark, MD as Consulting Physician (Hematology and Oncology) Nye, DPM as Consulting Physician (Podiatry) Kathaleen Bury as Nurse Practitioner (Pain Medicine) Druscilla Brownie, MD as Consulting Physician (Dermatology) Bryson Ha, OD as Consulting Physician (Optometry)  Assessment:   This is a routine wellness examination for [name].   Hearing/Vision screen  Hearing Screening   125Hz 250Hz 500Hz 1000Hz 2000Hz 4000Hz 8000Hz  Right ear:   0 40 40 0   Left ear:   0 0 0 0   Vision Screening Comments: Last vision exam approx. 1 yr ago with Dr. Kerin Ransom  Dietary issues and exercise activities discussed: Current Exercise Habits: The patient does not participate in regular exercise at present, Exercise limited by: None identified  Goals    . Increase water intake     Starting 01/24/2016, I will attempt to drink at least 6 glasses of water daily.       Depression Screen PHQ 2/9 Scores 01/24/2016 05/10/2014 08/05/2011  PHQ - 2 Score 0 0 0    Fall Risk Fall Risk  01/24/2016  07/13/2014 06/07/2014 05/10/2014 03/13/2014  Falls in the past year? _0   Risk for fall due to : - - - - -    Cognitive Function: MMSE - Mini Mental State Exam 01/24/2016  Orientation to time 5  Orientation to Place 5  Registration 3  Attention/ Calculation 0  Recall 3  Language- name 2 objects 0  Language- repeat 1  Language- follow 3 step command 3  Language- read & follow direction 0  Write a sentence 0  Copy design 0  Total score 20   PLEASE NOTE: A Mini-Cog screen was completed. Maximum score is 20. A value of 0 denotes this part of Folstein MMSE was not completed or the patient failed this part of the Mini-Cog screening.  Mini-Cog Screening Orientation to Time - Max 5 pts Orientation to Place - Max 5 pts Registration - Max 3 pts Recall - Max 3 pts Language Repeat - Max 1 pts Language Follow 3 Step Command - Max 3 pts   Screening Tests Health Maintenance  Topic Date Due  . PNA vac Low Risk Adult (2 of 2 - PPSV23) 01/31/2016 (Originally 05/19/2015)  . ZOSTAVAX  01/23/2029 (Originally 10/20/2005)  . INFLUENZA VACCINE  02/25/2016  . TETANUS/TDAP  08/02/2016  . COLONOSCOPY  09/17/2016  . Hepatitis C Screening  Completed        Plan:    I have personally reviewed and addressed the Medicare Annual Wellness questionnaire and have noted the following in the patient's chart:  A. Medical and social history B. Use of alcohol, tobacco or illicit drugs  C. Current medications and supplements D. Functional ability and status E.  Nutritional status F.  Physical activity G. Advance directives H. List of other physicians I.  Hospitalizations, surgeries, and ER visits in previous 12 months J.  [place] to include hearing, vision, cognitive, depression L. Referrals and appointments - none  In addition, I have reviewed and discussed with patient certain preventive protocols, quality metrics, and best practice recommendations. A written personalized  care plan for preventive services as well as general preventive health recommendations were provided to patient.  See attached scanned questionnaire for additional information.   Signed,   Lindell Noe, MHA, BS, LPN Health Advisor

## 2016-01-26 NOTE — Progress Notes (Signed)
Pre visit review using our clinic review tool, if applicable. No additional management support is needed unless otherwise documented below in the visit note. 

## 2016-01-26 NOTE — Progress Notes (Signed)
PCP notes:  Health maintenance:   Hep C screening - completed PPSV23 - pt will contact [place] about immunization record and get vaccine at [facility] if needed  Abnormal screenings:   Hearing - failed.  Patient concerns: None  Nurse concerns: None  Next PCP appt: 01/31/16 @ 1000

## 2016-01-29 ENCOUNTER — Encounter: Payer: Self-pay | Admitting: Hematology and Oncology

## 2016-01-29 NOTE — Progress Notes (Signed)
Per [facility] revlimid was shipped via [facility] 01/27/16

## 2016-01-30 NOTE — Progress Notes (Signed)
I reviewed health advisor's note, was available for consultation, and agree with documentation and plan.  Amy Bedsole, MD [facility] [place] [place]  

## 2016-01-31 ENCOUNTER — Encounter: Payer: Self-pay | Admitting: Family Medicine

## 2016-01-31 ENCOUNTER — Ambulatory Visit (INDEPENDENT_AMBULATORY_CARE_PROVIDER_SITE_OTHER): Payer: Medicare Other | Admitting: Family Medicine

## 2016-01-31 VITALS — BP 118/69 | HR 79 | Temp 97.9°F | Ht 73.0 in | Wt 191.2 lb

## 2016-01-31 DIAGNOSIS — I10 Essential (primary) hypertension: Secondary | ICD-10-CM

## 2016-01-31 DIAGNOSIS — M810 Age-related osteoporosis without current pathological fracture: Secondary | ICD-10-CM

## 2016-01-31 DIAGNOSIS — C61 Malignant neoplasm of prostate: Secondary | ICD-10-CM | POA: Diagnosis not present

## 2016-01-31 DIAGNOSIS — E781 Pure hyperglyceridemia: Secondary | ICD-10-CM

## 2016-01-31 DIAGNOSIS — M549 Dorsalgia, unspecified: Secondary | ICD-10-CM

## 2016-01-31 DIAGNOSIS — G8929 Other chronic pain: Secondary | ICD-10-CM

## 2016-01-31 DIAGNOSIS — C9001 Multiple myeloma in remission: Secondary | ICD-10-CM

## 2016-01-31 DIAGNOSIS — L570 Actinic keratosis: Secondary | ICD-10-CM

## 2016-01-31 NOTE — Progress Notes (Signed)
Pre visit review using our clinic review tool, if applicable. No additional management support is needed unless otherwise documented below in the visit note. 

## 2016-01-31 NOTE — Assessment & Plan Note (Signed)
Followed at  [facility] pain center and tolerable on fentanyl patch.

## 2016-01-31 NOTE — Assessment & Plan Note (Signed)
Stable on no med with lifestyle change and weight loss.

## 2016-01-31 NOTE — Assessment & Plan Note (Signed)
Currently being treated and followed by ONC.

## 2016-01-31 NOTE — Assessment & Plan Note (Signed)
Resolved with lifestyle and weight loss.

## 2016-01-31 NOTE — Progress Notes (Signed)
Subjective:    Patient ID: [name], male    DOB: Aug 18, 1945, 70 y.o.   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   Per last ONC note 11/2015:  He was treated with bone marrow transplant but the effect did not last. Since he was placed on Revlimid, he achieved complete remission. He tolerated Revlimid at 5 mg well. Will continue the same He will continue on 81 mg aspirin to prevent DVT. He will visit with dentist twice a year for dental checkup while on Zometa. He will remain on calcium with vitamin D supplement. After today's dose of Zometa, I plan to lengthen treatment to every 6 months  Followed by Dr. Alvy Bimler.  Prostate cancer managed by [facility]. Dr. Alinda Money S/P prostatectomy.  Following PSA as it trends up.  He saw Candis Musa, LPN for medicare wellness on 6/30. Note will be reviewed in detail when completed.   Chronic kidney disease moderate III: stable.   HAs AKs.. Has appt with Dr. Ouida Sills at [facility].   Has issues with back pain from met cancer, peripheral neuropathy : Treated at [facility].. Fentanyl patch controlling pain at a tolerable level.  Working in the garden daily.   Reviewed labs in detail.   Lab Results  Component Value Date   CHOL 151 01/24/2016   HDL 48.70 01/24/2016   LDLCALC 90 01/24/2016   LDLDIRECT 124.3 11/16/2007   TRIG 61.0 01/24/2016   CHOLHDL 3 01/24/2016    Wt Readings from Last 3 Encounters:  01/31/16 191 lb 4 oz (86.75 kg)  01/24/16 189 lb 8 oz (85.957 kg)  12/13/15 194 lb 14.4 oz (88.406 kg)    Osteoporosis: Vit D stable.   Not on med for history of HTN given weight loss.  BP Readings from Last 3 Encounters:  01/31/16 118/69  01/24/16 118/70  12/13/15 124/73     Social History /Family History/Past Medical History reviewed and updated if needed.   Review of Systems    Constitutional: Negative for fever and fatigue.  HENT: Negative for ear pain.   Eyes: Negative for pain.  Respiratory: Negative for cough and shortness of breath.   Cardiovascular: Negative for chest pain, palpitations and leg swelling.  Gastrointestinal: Negative for abdominal pain.  Genitourinary: Negative for dysuria.  Musculoskeletal: Negative for arthralgias.  Neurological: Negative for syncope, light-headedness and headaches.  Psychiatric/Behavioral: Negative for dysphoric mood.       Objective:   Physical Exam  Constitutional: Vital signs are normal. He appears well-developed and well-nourished.  HENT:  Head: Normocephalic.  Right Ear: Hearing normal.  Left Ear: Hearing normal.  Nose: Nose normal.  Mouth/Throat: Oropharynx is clear and moist and mucous membranes are normal.  Neck: Trachea normal. Carotid bruit is not present. No thyroid mass and no thyromegaly present.  Cardiovascular: Normal rate, regular rhythm and normal pulses.  Exam reveals no gallop, no distant heart sounds and no friction rub.   No murmur heard. No peripheral edema  Pulmonary/Chest: Effort normal and breath sounds normal. No respiratory distress.  Musculoskeletal:       Thoracic back: He exhibits decreased range of motion, tenderness and bony tenderness.       Lumbar back: He exhibits decreased range of motion, tenderness and bony tenderness.  Skin: Skin is warm, dry and intact. No rash noted.  Dry flaky skin  and multiple AKs  Psychiatric: He has a normal mood and affect. His speech is normal and behavior is normal. Thought content normal.          Assessment & Plan:  The patient's preventative maintenance and recommended screening tests for an annual wellness exam were reviewed in full today. Brought up to date unless services declined.  Counselled on the importance of diet, exercise, and its role in overall health and mortality. The patient's FH and SH was reviewed, including their home  life, tobacco status, and drug and alcohol status.   Hep C: neg Colon: Dr. Ardis Hughs flexible sigmoidoscopy 2010, colonscopy 2008.. Likely due in 2018.  Nonsmoker Vaccines: he is uptodate with PNA vaccines 2012 and 2015 Hearing failed on the left.. He has not noted any issues PSA 12.91  5/10/21017; followed by Dr. Alinda Money

## 2016-01-31 NOTE — Patient Instructions (Signed)
Keep working on [employer] and regualr activity.

## 2016-01-31 NOTE — Assessment & Plan Note (Signed)
Recommended follow up for cryotherapy at [facility] in next few months.

## 2016-01-31 NOTE — Assessment & Plan Note (Signed)
PSA trending ing up. Dr. 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

## 2016-01-31 NOTE — Assessment & Plan Note (Signed)
Vit D in nml range.

## 2016-02-05 ENCOUNTER — Other Ambulatory Visit: Payer: Self-pay | Admitting: Hematology and Oncology

## 2016-02-19 ENCOUNTER — Telehealth: Payer: Self-pay | Admitting: *Deleted

## 2016-02-19 MED ORDER — LENALIDOMIDE 5 MG PO CAPS: ORAL_CAPSULE | ORAL | 0 refills | Status: DC

## 2016-02-19 NOTE — Telephone Encounter (Signed)
Pt called to request refill on Revlimd.  He is taking his last one today and then has a week off.  Informed pt of refill sent to [facility].  He verbalized understanding.

## 2016-02-24 ENCOUNTER — Telehealth: Payer: Self-pay | Admitting: Hematology and Oncology

## 2016-02-24 NOTE — Telephone Encounter (Signed)
Pt called to r/s 8/18 appt to 8/21 due to being out of town

## 2016-03-13 ENCOUNTER — Other Ambulatory Visit: Payer: Medicare Other

## 2016-03-13 ENCOUNTER — Ambulatory Visit: Payer: Medicare Other | Admitting: Hematology and Oncology

## 2016-03-16 ENCOUNTER — Ambulatory Visit (HOSPITAL_BASED_OUTPATIENT_CLINIC_OR_DEPARTMENT_OTHER): Payer: Medicare Other

## 2016-03-16 ENCOUNTER — Other Ambulatory Visit (HOSPITAL_BASED_OUTPATIENT_CLINIC_OR_DEPARTMENT_OTHER): Payer: Medicare Other

## 2016-03-16 ENCOUNTER — Ambulatory Visit (HOSPITAL_BASED_OUTPATIENT_CLINIC_OR_DEPARTMENT_OTHER): Payer: Medicare Other | Admitting: Hematology and Oncology

## 2016-03-16 ENCOUNTER — Telehealth: Payer: Self-pay | Admitting: Hematology and Oncology

## 2016-03-16 ENCOUNTER — Encounter: Payer: Self-pay | Admitting: Hematology and Oncology

## 2016-03-16 DIAGNOSIS — C61 Malignant neoplasm of prostate: Secondary | ICD-10-CM

## 2016-03-16 DIAGNOSIS — C9001 Multiple myeloma in remission: Secondary | ICD-10-CM

## 2016-03-16 DIAGNOSIS — D696 Thrombocytopenia, unspecified: Secondary | ICD-10-CM | POA: Diagnosis not present

## 2016-03-16 DIAGNOSIS — C9000 Multiple myeloma not having achieved remission: Secondary | ICD-10-CM

## 2016-03-16 DIAGNOSIS — Z95828 Presence of other vascular implants and grafts: Secondary | ICD-10-CM | POA: Insufficient documentation

## 2016-03-16 LAB — COMPREHENSIVE METABOLIC PANEL
ALK PHOS: 64 U/L (ref 40–150)
ALT: 25 U/L (ref 0–55)
ANION GAP: 7 meq/L (ref 3–11)
AST: 30 U/L (ref 5–34)
Albumin: 3.2 g/dL — ABNORMAL LOW (ref 3.5–5.0)
BUN: 20.2 mg/dL (ref 7.0–26.0)
CO2: 26 meq/L (ref 22–29)
Calcium: 9.4 mg/dL (ref 8.4–10.4)
Chloride: 109 mEq/L (ref 98–109)
Creatinine: 1.3 mg/dL (ref 0.7–1.3)
EGFR: 57 mL/min/{1.73_m2} — AB (ref >90)
GLUCOSE: 102 mg/dL (ref 70–140)
POTASSIUM: 3.9 meq/L (ref 3.5–5.1)
SODIUM: 141 meq/L (ref 136–145)
Total Bilirubin: 0.87 mg/dL (ref 0.20–1.20)
Total Protein: 6.5 g/dL (ref 6.4–8.3)

## 2016-03-16 LAB — CBC WITH DIFFERENTIAL/PLATELET
BASO%: 1.3 % (ref 0.0–2.0)
BASOS ABS: 0.1 10*3/uL (ref 0.0–0.1)
EOS ABS: 0.4 10*3/uL (ref 0.0–0.5)
EOS%: 7.8 % — ABNORMAL HIGH (ref 0.0–7.0)
HEMATOCRIT: 43.2 % (ref 38.4–49.9)
HEMOGLOBIN: 14.1 g/dL (ref 13.0–17.1)
LYMPH#: 1 10*3/uL (ref 0.9–3.3)
LYMPH%: 21.5 % (ref 14.0–49.0)
MCH: 33.7 pg — AB (ref 27.2–33.4)
MCHC: 32.6 g/dL (ref 32.0–36.0)
MCV: 103.4 fL — AB (ref 79.3–98.0)
MONO#: 0.8 10*3/uL (ref 0.1–0.9)
MONO%: 16.5 % — ABNORMAL HIGH (ref 0.0–14.0)
NEUT#: 2.5 10*3/uL (ref 1.5–6.5)
NEUT%: 52.9 % (ref 39.0–75.0)
Platelets: 128 10*3/uL — ABNORMAL LOW (ref 140–400)
RBC: 4.18 10*6/uL — ABNORMAL LOW (ref 4.20–5.82)
RDW: 14.8 % — AB (ref 11.0–14.6)
WBC: 4.7 10*3/uL (ref 4.0–10.3)

## 2016-03-16 MED ORDER — HEPARIN SOD (PORK) LOCK FLUSH 100 UNIT/ML IV SOLN
250.0000 [IU] | Freq: Once | INTRAVENOUS | Status: DC | PRN
  Filled 2016-03-16: qty 5

## 2016-03-16 MED ORDER — HEPARIN SOD (PORK) LOCK FLUSH 100 UNIT/ML IV SOLN
500.0000 [IU] | Freq: Once | INTRAVENOUS | Status: AC | PRN
  Administered 2016-03-16: 500 [IU] via INTRAVENOUS
  Filled 2016-03-16: qty 5

## 2016-03-16 MED ORDER — SODIUM CHLORIDE 0.9 % IJ SOLN
3.0000 mL | Freq: Once | INTRAMUSCULAR | Status: DC | PRN
  Filled 2016-03-16: qty 10

## 2016-03-16 MED ORDER — SODIUM CHLORIDE 0.9 % IJ SOLN
10.0000 mL | INTRAMUSCULAR | Status: DC | PRN
  Administered 2016-03-16: 10 mL via INTRAVENOUS
  Filled 2016-03-16: qty 10

## 2016-03-16 NOTE — Assessment & Plan Note (Signed)
He had recent elevated PSA and is currently undergoing evaluation by urologist. I will defer to them for further management. Repeat PSA today is pending, will send results to his urologist

## 2016-03-16 NOTE — Telephone Encounter (Signed)
Gave patient avs report and appointments for October and november

## 2016-03-16 NOTE — Assessment & Plan Note (Signed)
We discussed treatment plan.  He was treated with bone marrow transplant but the effect did not last. Since he was placed on Revlimid, he achieved complete remission. He tolerated Revlimid at 5 mg well.  Will continue the same He will continue on 81 mg aspirin to prevent DVT. He will visit with dentist twice a year for dental checkup while on Zometa. He will remain on calcium with vitamin D supplement. His next dose Zometa is in November 2017, treatment is changed from every 3 months to every 6 months

## 2016-03-16 NOTE — Assessment & Plan Note (Signed)
This is likely due to recent treatment. The patient denies recent history of bleeding such as epistaxis, hematuria or hematochezia. He is asymptomatic from the low platelet count. I will observe for now.

## 2016-03-16 NOTE — Progress Notes (Signed)
[place] OFFICE PROGRESS NOTE  Patient Care Team: Jinny Sanders, MD as PCP - General Jeanann Lewandowsky, MD as Consulting Physician (Hematology and Oncology) Cathlean Marseilles, NP as Nurse Practitioner (Hematology and Oncology) Raynelle Bring, MD as Consulting Physician (Urology) Heath Lark, MD as Consulting Physician (Hematology and Oncology) [place], DPM as Consulting Physician (Podiatry) Kathaleen Bury, NP as Nurse Practitioner (Pain Medicine) Druscilla Brownie, MD as Consulting Physician (Dermatology) Bryson Ha, OD as Consulting Physician (Optometry)  SUMMARY OF ONCOLOGIC HISTORY:  Principle Diagnosis: Kappa light chain multiple myeloma, ISS III with diagnosis established in December 2012.  Prior Therapy:  1. Velcade, Cytoxan, and Decadron on a weekly basis from 07/10/2011 through 10/23/2011; 2. High-dose chemotherapy on 11/23/2011, consisting of carmustine 600 mg IV. He received cytarabine 400 mg IV on 12/24/2011, 12/25/2011, 12/26/2011, and 12/27/2011. He received etoposide 300 mg IV daily from 12/24/2011 through 12/27/2011, four doses. He received melphalan 280 mg IV on 12/28/2011.  3. 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  Current therapy: -Revlimid 5 mg daily, 3 weeks on, 1 week off. Revlimid was started on 05/11/2012.  Zometa was started on 09/02/2012.  Dental clearance was obtained.  Zometa 4 mg was placed on hold due to current osteomyelitis but restarted on 06/23/2013.    He also reports that his PSA is slowly rising and this may require future therapy by his urologist, Dr. Alinda Money.   INTERVAL HISTORY: Please see below for problem oriented charting. Denies new back pain. 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 He follows at [facility] for pain management  The patient denies any recent signs or symptoms of bleeding such as spontaneous epistaxis, hematuria or  hematochezia. He was noted to have elevated PSA recently. He denies dental issues The patient denies any recent signs or symptoms of bleeding such as spontaneous epistaxis, hematuria or hematochezia.  REVIEW OF SYSTEMS:   Constitutional: Denies fevers, chills or abnormal weight loss Eyes: Denies blurriness of vision Ears, nose, mouth, throat, and face: Denies mucositis or sore throat Respiratory: Denies cough, dyspnea or wheezes Cardiovascular: Denies palpitation, chest discomfort or lower extremity swelling Gastrointestinal:  Denies nausea, heartburn or change in bowel habits Skin: Denies abnormal skin rashes Lymphatics: Denies new lymphadenopathy or easy bruising Neurological:Denies numbness, tingling or new weaknesses Behavioral/Psych: Mood is stable, no new changes  All other systems were reviewed with the patient and are negative.  I have reviewed the past medical history, past surgical history, social history and family history with the patient and they are unchanged from previous note.  ALLERGIES:  is allergic to celebrex [celecoxib]; cymbalta [duloxetine hcl]; meloxicam; oxycontin [oxycodone]; percocet [oxycodone-acetaminophen]; vicodin [hydrocodone-acetaminophen]; and lyrica [pregabalin].  MEDICATIONS:  Current Outpatient Prescriptions  Medication Sig Dispense Refill  . aspirin 81 MG tablet Take 81 mg by mouth daily.    . B Complex Vitamins (VITAMIN-B COMPLEX) TABS Take by mouth.    . Calcium Carbonate-Vitamin D 600-400 MG-UNIT per tablet Take by mouth.    . cholecalciferol (VITAMIN D) 1000 UNITS tablet Take 1,000 Units by mouth daily.    . fentaNYL (DURAGESIC - DOSED MCG/HR) 50 MCG/HR Place 1 patch (50 mcg total) onto the skin every 3 (three) days. 10 patch 0  . lenalidomide (REVLIMID) 5 MG capsule Take one capsule by mouth daily for 21 days, then off 7 days. 21 capsule 0  . lidocaine-prilocaine (EMLA) cream Apply topically as needed.  Apply to port site one hour before  treatment and cover with plastic wrap 30 g 2  . NONFORMULARY OR COMPOUNDED ITEM Baclofn/amitrp/ketamn cream topically to feet TID prn for neuropathy    . oxyCODONE (OXY IR/ROXICODONE) 5 MG immediate release tablet TK 1 T PO Q 4 H PRN P  0  . pantoprazole (PROTONIX) 40 MG tablet Take 40 mg by mouth daily.    . pantoprazole (PROTONIX) 40 MG tablet TAKE 1 TABLET (40 MG TOTAL) BY MOUTH DAILY. 90 tablet 3   No current facility-administered medications for this visit.     PHYSICAL EXAMINATION: ECOG PERFORMANCE STATUS: 0 - Asymptomatic  Vitals:   03/16/16 1259  BP: 121/72  Pulse: 62  Resp: 18  Temp: 98 F (36.7 C)   Filed Weights   03/16/16 1259  Weight: 191 lb 1.6 oz (86.7 kg)    GENERAL:alert, no distress and comfortable SKIN: skin color, texture, turgor are normal, no rashes or significant lesions EYES: normal, Conjunctiva are pink and non-injected, sclera clear OROPHARYNX:no exudate, no erythema and lips, buccal mucosa, and tongue normal  NECK: supple, thyroid normal size, non-tender, without nodularity LYMPH:  no palpable lymphadenopathy in the cervical, axillary or inguinal LUNGS: clear to auscultation and percussion with normal breathing effort HEART: regular rate & rhythm and no murmurs and no lower extremity edema ABDOMEN:abdomen soft, non-tender and normal bowel sounds Musculoskeletal:no cyanosis of digits and no clubbing  NEURO: alert & oriented x 3 with fluent speech, no focal motor/sensory deficits  LABORATORY DATA:  I have reviewed the data as listed    Component Value Date/Time   NA 136 12/13/2015 1149   K 4.0 12/13/2015 1149   CL 105 04/12/2014 1041   CL 108 (H) 09/30/2013 0945   CL 110 (H) 12/29/2012 1151   CO2 26 12/13/2015 1149   GLUCOSE 125 12/13/2015 1149   GLUCOSE 127 (H) 12/29/2012 1151   BUN 14.9 12/13/2015 1149   CREATININE 1.4 (H) 12/13/2015 1149   CALCIUM 8.6 12/13/2015 1149   PROT 5.9 (L) 12/13/2015 1149   PROT 6.5 12/13/2015 1149   ALBUMIN  3.3 (L) 12/13/2015 1149   AST 32 12/13/2015 1149   ALT 31 12/13/2015 1149   ALKPHOS 56 12/13/2015 1149   BILITOT 0.61 12/13/2015 1149   GFRNONAA >60 09/30/2013 0945   GFRAA >60 09/30/2013 0945    No results found for: SPEP, UPEP  Lab Results  Component Value Date   WBC 4.7 03/16/2016   NEUTROABS 2.5 03/16/2016   HGB 14.1 03/16/2016   HCT 43.2 03/16/2016   MCV 103.4 (H) 03/16/2016   PLT 128 (L) 03/16/2016      Chemistry      Component Value Date/Time   NA 136 12/13/2015 1149   K 4.0 12/13/2015 1149   CL 105 04/12/2014 1041   CL 108 (H) 09/30/2013 0945   CL 110 (H) 12/29/2012 1151   CO2 26 12/13/2015 1149   BUN 14.9 12/13/2015 1149   CREATININE 1.4 (H) 12/13/2015 1149      Component Value Date/Time   CALCIUM 8.6 12/13/2015 1149   ALKPHOS 56 12/13/2015 1149   AST 32 12/13/2015 1149   ALT 31 12/13/2015 1149   BILITOT 0.61 12/13/2015 1149       ASSESSMENT & PLAN:  Multiple myeloma in remission ([place]) We discussed treatment plan.  He was treated with bone marrow transplant but the effect did not last. Since he was placed on Revlimid, he achieved complete remission. He tolerated Revlimid at  5 mg well.  Will continue the same He will continue on 81 mg aspirin to prevent DVT. He will visit with dentist twice a year for dental checkup while on Zometa. He will remain on calcium with vitamin D supplement. His next dose Zometa is in November 2017, treatment is changed from every 3 months to every 6 months  Malignant neoplasm of prostate [facility]) He had recent elevated PSA and is currently undergoing evaluation by urologist. I will defer to them for further management. Repeat PSA today is pending, will send results to his urologist    Thrombocytopenia [facility]) This is likely due to recent treatment. The patient denies recent history of bleeding such as epistaxis, hematuria or hematochezia. He is asymptomatic from the low platelet count. I will observe for now.   No orders  of the defined types were placed in this encounter.  All questions were answered. The patient knows to call the clinic with any problems, questions or concerns. No barriers to learning was detected. I spent 15 minutes counseling the patient face to face. The total time spent in the appointment was 20 minutes and more than 50% was on counseling and review of test results     [facility], Katia Hannen, MD 03/16/2016 1:11 PM

## 2016-03-17 ENCOUNTER — Other Ambulatory Visit: Payer: Self-pay | Admitting: *Deleted

## 2016-03-17 ENCOUNTER — Telehealth: Payer: Self-pay | Admitting: *Deleted

## 2016-03-17 LAB — PSA: Prostate Specific Ag, Serum: 14.1 ng/mL — ABNORMAL HIGH (ref 0.0–4.0)

## 2016-03-17 LAB — KAPPA/LAMBDA LIGHT CHAINS
IG KAPPA FREE LIGHT CHAIN: 37.7 mg/L — AB (ref 3.3–19.4)
Ig Lambda Free Light Chain: 35.5 mg/L — ABNORMAL HIGH (ref 5.7–26.3)
Kappa/Lambda FluidC Ratio: 1.06 (ref 0.26–1.65)

## 2016-03-17 MED ORDER — LENALIDOMIDE 5 MG PO CAPS: ORAL_CAPSULE | ORAL | 0 refills | Status: DC

## 2016-03-17 NOTE — Telephone Encounter (Signed)
-----   Message from Heath Lark, MD sent at 03/17/2016  7:54 AM EDT ----- Regarding: PSA results PLs call patient to inform him of elevated PSA result Per patient request, he wants a copy sent to his urologist, Dr. Alinda Money ----- Message ----- From: Interface, Lab In Three Zero One Sent: 03/16/2016  12:59 PM To: Heath Lark, MD

## 2016-03-17 NOTE — Telephone Encounter (Signed)
"  I just missed a call from [place]. Could you help me." Shared PSA results.  Routed to urologist per patient request.

## 2016-03-17 NOTE — Telephone Encounter (Signed)
Pt notified of message below.   Will fax to Dr Alinda Money

## 2016-03-18 LAB — MULTIPLE MYELOMA PANEL, SERUM
Albumin SerPl Elph-Mcnc: 3.4 g/dL (ref 2.9–4.4)
Albumin/Glob SerPl: 1.3 (ref 0.7–1.7)
Alpha 1: 0.2 g/dL (ref 0.0–0.4)
Alpha2 Glob SerPl Elph-Mcnc: 0.6 g/dL (ref 0.4–1.0)
B-GLOBULIN SERPL ELPH-MCNC: 0.8 g/dL (ref 0.7–1.3)
Gamma Glob SerPl Elph-Mcnc: 1 g/dL (ref 0.4–1.8)
Globulin, Total: 2.7 g/dL (ref 2.2–3.9)
IGA/IMMUNOGLOBULIN A, SERUM: 182 mg/dL (ref 61–437)
IGM (IMMUNOGLOBIN M), SRM: 22 mg/dL (ref 20–172)
IgG, Qn, Serum: 1035 mg/dL (ref 700–1600)
TOTAL PROTEIN: 6.1 g/dL (ref 6.0–8.5)

## 2016-04-14 ENCOUNTER — Encounter: Payer: Self-pay | Admitting: Podiatry

## 2016-04-15 ENCOUNTER — Ambulatory Visit (INDEPENDENT_AMBULATORY_CARE_PROVIDER_SITE_OTHER): Payer: Medicare Other | Admitting: Podiatry

## 2016-04-15 ENCOUNTER — Telehealth: Payer: Self-pay | Admitting: *Deleted

## 2016-04-15 DIAGNOSIS — L02612 Cutaneous abscess of left foot: Secondary | ICD-10-CM

## 2016-04-15 DIAGNOSIS — B351 Tinea unguium: Secondary | ICD-10-CM

## 2016-04-15 DIAGNOSIS — L03032 Cellulitis of left toe: Secondary | ICD-10-CM

## 2016-04-15 DIAGNOSIS — L89891 Pressure ulcer of other site, stage 1: Secondary | ICD-10-CM | POA: Diagnosis not present

## 2016-04-15 DIAGNOSIS — M79676 Pain in unspecified toe(s): Secondary | ICD-10-CM

## 2016-04-15 DIAGNOSIS — Q828 Other specified congenital malformations of skin: Secondary | ICD-10-CM

## 2016-04-15 MED ORDER — LENALIDOMIDE 5 MG PO CAPS: ORAL_CAPSULE | ORAL | 0 refills | Status: DC

## 2016-04-15 MED ORDER — CEPHALEXIN 500 MG PO CAPS: 500.0000 mg | ORAL_CAPSULE | Freq: Three times a day (TID) | ORAL | 0 refills | Status: DC

## 2016-04-15 NOTE — Progress Notes (Signed)
He presents today with a chief complaint of painful elongated toenails and porokeratosis.  Objective: Pulses remain palpable reactive hyperkeratosis plantar aspect bilateral foot. No open lesions or wounds. Toenails are thick yellow dystrophic onychomycotic.  Assessment: Pain in limb secondary to onychomycosis and porokeratosis.  Plan: Debridement of all reactive hyperkeratoses and debridement of nails 1 through 5 bilateral. Follow up with me in 3 months.

## 2016-04-15 NOTE — Telephone Encounter (Signed)
Pt called to request refill on Revlimid sent to [facility]. He took his last pill today and starts next cycle in one week.  Rx e scribed to [facility].

## 2016-04-27 ENCOUNTER — Ambulatory Visit (HOSPITAL_BASED_OUTPATIENT_CLINIC_OR_DEPARTMENT_OTHER): Payer: Medicare Other

## 2016-04-27 DIAGNOSIS — Z95828 Presence of other vascular implants and grafts: Secondary | ICD-10-CM

## 2016-04-27 DIAGNOSIS — C9001 Multiple myeloma in remission: Secondary | ICD-10-CM | POA: Diagnosis not present

## 2016-04-27 DIAGNOSIS — Z452 Encounter for adjustment and management of vascular access device: Secondary | ICD-10-CM | POA: Diagnosis present

## 2016-04-27 MED ORDER — SODIUM CHLORIDE 0.9 % IJ SOLN
10.0000 mL | INTRAMUSCULAR | Status: DC | PRN
  Administered 2016-04-27: 10 mL via INTRAVENOUS
  Filled 2016-04-27: qty 10

## 2016-04-27 MED ORDER — HEPARIN SOD (PORK) LOCK FLUSH 100 UNIT/ML IV SOLN
500.0000 [IU] | Freq: Once | INTRAVENOUS | Status: AC | PRN
  Administered 2016-04-27: 500 [IU] via INTRAVENOUS
  Filled 2016-04-27: qty 5

## 2016-05-04 ENCOUNTER — Ambulatory Visit (INDEPENDENT_AMBULATORY_CARE_PROVIDER_SITE_OTHER): Payer: Medicare Other | Admitting: Podiatry

## 2016-05-04 ENCOUNTER — Encounter: Payer: Self-pay | Admitting: Podiatry

## 2016-05-04 DIAGNOSIS — L97521 Non-pressure chronic ulcer of other part of left foot limited to breakdown of skin: Secondary | ICD-10-CM

## 2016-05-04 DIAGNOSIS — L89891 Pressure ulcer of other site, stage 1: Secondary | ICD-10-CM

## 2016-05-05 NOTE — Progress Notes (Signed)
He presents today for follow-up of his ulceration to the medial aspect of the second digit left foot. He states it seems to be doing better. He states that he dresses and occasionally if he is going to allow walking.  Objective: Vital signs are stable he is alert and oriented 3. Cellulitis has resolved with the antibiotics. There is dried exudate which was removed demonstrates a very clean 3-1/2 mm wound medial to the PIPJ which does not probe deep.  Chronic superficial ulceration medial aspect second digit right foot associated with juxtaposition of the hallux and the second toe.  Plan: Debrided the wound today. Bleeding is present. Does not probe deep. Provided him with a sample of Iodosorb gel and demonstrated to him how to dress this daily. I will follow-up with him in 3-4 weeks to reevaluate we did discuss the possible need for surgical intervention and including possible irritation of the toe.

## 2016-05-07 ENCOUNTER — Ambulatory Visit (INDEPENDENT_AMBULATORY_CARE_PROVIDER_SITE_OTHER): Payer: Medicare Other | Admitting: Podiatry

## 2016-05-07 ENCOUNTER — Encounter: Payer: Self-pay | Admitting: Podiatry

## 2016-05-07 DIAGNOSIS — L02612 Cutaneous abscess of left foot: Secondary | ICD-10-CM

## 2016-05-07 DIAGNOSIS — L89891 Pressure ulcer of other site, stage 1: Secondary | ICD-10-CM

## 2016-05-07 DIAGNOSIS — L97521 Non-pressure chronic ulcer of other part of left foot limited to breakdown of skin: Secondary | ICD-10-CM

## 2016-05-07 DIAGNOSIS — L03032 Cellulitis of left toe: Secondary | ICD-10-CM

## 2016-05-07 MED ORDER — CLINDAMYCIN HCL 150 MG PO CAPS
150.0000 mg | ORAL_CAPSULE | Freq: Three times a day (TID) | ORAL | 0 refills | Status: DC
Start: 2016-05-07 — End: 2016-05-08

## 2016-05-07 NOTE — Progress Notes (Signed)
He presents today on emergent basis for a painful second toe of his left foot. He states him about ready to have this thing cut off. I walk up and was red-hot swollen and painful.  Objective: Vital signs are stable alert and oriented 3. Pulses are palpable. He is alert and oriented. Cellulitis extensive level of metatarsophalangeal joint from a superficial ulceration medial aspect of the second toe left foot at the level of the PIPJ. This does probe deep to capsule but does not probe to bone.  Assessment: Capsulitis cellulitis ulceration second digit left foot.  Plan: Debrided reactive hyperkeratosis debrided necrotic tissue placed him in a dry sterile compressive dressing encouraged the use of antibiotics and daily dressing changes. He will use Iodosorb gel for the dressing changes. Started him on clindamycin 150 mg capsules one by mouth 3 times a day. Follow up with him next week to discuss surgical intervention. I did discuss with him that should he start to develop any signs or symptoms of infection which we discussed thoroughly today he is to go to the emergency department.

## 2016-05-08 ENCOUNTER — Ambulatory Visit: Payer: Medicare Other

## 2016-05-08 ENCOUNTER — Other Ambulatory Visit: Payer: Medicare Other

## 2016-05-08 ENCOUNTER — Telehealth: Payer: Self-pay | Admitting: *Deleted

## 2016-05-08 MED ORDER — CLINDAMYCIN HCL 150 MG PO CAPS: 150.0000 mg | ORAL_CAPSULE | Freq: Three times a day (TID) | ORAL | 0 refills | Status: DC

## 2016-05-08 NOTE — Telephone Encounter (Signed)
Pt states Dr. Milinda Pointer was to order a medication it is not at the pharmacy. I reviewed the medication orders and the Clindamycin was sent to a mail order pharmacy. I changed to the [facility] in [place].

## 2016-05-08 NOTE — Telephone Encounter (Signed)
Pt states yesterday Dr. Milinda Pointer had asked what antibiotic he had been on previously for his infection and pt states he told Dr. Milinda Pointer Cephalexin. Pt asked is he on the right medication. I spoke with pt and told him the orders were for Clindamycin and I would inform Dr. Milinda Pointer of his concern. Pt states he has started the Clindamycin.

## 2016-05-11 ENCOUNTER — Telehealth: Payer: Self-pay | Admitting: *Deleted

## 2016-05-11 NOTE — Telephone Encounter (Addendum)
Pt states his foot is not healing and painful to put his foot down, states the foot did better on the cephalexin. 05/12/2016-Informed pt Dr. Milinda Pointer switch to Keflex and that it was sent to the [facility].

## 2016-05-11 NOTE — Telephone Encounter (Signed)
He and I discussed this and its fine if he is on the clindamycin.  This should work just as well.

## 2016-05-11 NOTE — Telephone Encounter (Signed)
Switch back to Keflex '500mg'$  qid X40.  If not improved or worsens go to  ED

## 2016-05-12 MED ORDER — CEPHALEXIN 500 MG PO CAPS: 500.0000 mg | ORAL_CAPSULE | Freq: Four times a day (QID) | ORAL | 0 refills | Status: DC

## 2016-05-12 NOTE — Telephone Encounter (Signed)
keflex

## 2016-05-12 NOTE — Addendum Note (Signed)
Addended by: Harriett Sine D on: 05/12/2016 08:55 AM   Modules accepted: Orders

## 2016-05-13 ENCOUNTER — Other Ambulatory Visit: Payer: Self-pay | Admitting: *Deleted

## 2016-05-13 MED ORDER — LENALIDOMIDE 5 MG PO CAPS: ORAL_CAPSULE | ORAL | 0 refills | Status: DC

## 2016-05-13 NOTE — Telephone Encounter (Signed)
[name] called for Revlimid refill  Escribed to [facility]  Also states he is now enrolled with [name]. RN called to request form be resent to [place] to complete.

## 2016-05-15 ENCOUNTER — Telehealth: Payer: Self-pay | Admitting: *Deleted

## 2016-05-15 NOTE — Telephone Encounter (Signed)
Diagnosis Verification form completed/ signed by Dr. Alvy Bimler and faxed to [facility] [place] [facility] at fax [phone]

## 2016-05-18 ENCOUNTER — Other Ambulatory Visit: Payer: Self-pay | Admitting: *Deleted

## 2016-05-18 ENCOUNTER — Telehealth: Payer: Self-pay | Admitting: *Deleted

## 2016-05-18 MED ORDER — LENALIDOMIDE 5 MG PO CAPS: ORAL_CAPSULE | ORAL | 0 refills | Status: DC

## 2016-05-18 NOTE — Telephone Encounter (Signed)
Pt left VM x 2 stating he needs refill on Revlimid and [facility] says they have not received refill from [place] yet in spite of them sending [place] requests.   It is documented in EMR that Refill was sent to [facility] last week on 10/18.  Called pt back and he says this is a mistake, he has used [facility] for years and does not know of any reason why it should be changed to another pharmacy.  Resent Refill to [facility].  Pt will call them a little later to day to make sure they got it and he will call us back if any problems.

## 2016-05-19 NOTE — Telephone Encounter (Signed)
Opened note in error.

## 2016-05-20 ENCOUNTER — Encounter: Payer: Self-pay | Admitting: Podiatry

## 2016-05-20 ENCOUNTER — Ambulatory Visit (INDEPENDENT_AMBULATORY_CARE_PROVIDER_SITE_OTHER): Payer: Medicare Other | Admitting: Podiatry

## 2016-05-20 DIAGNOSIS — L02612 Cutaneous abscess of left foot: Secondary | ICD-10-CM

## 2016-05-20 DIAGNOSIS — L97521 Non-pressure chronic ulcer of other part of left foot limited to breakdown of skin: Secondary | ICD-10-CM

## 2016-05-20 DIAGNOSIS — L89891 Pressure ulcer of other site, stage 1: Secondary | ICD-10-CM

## 2016-05-20 DIAGNOSIS — L03032 Cellulitis of left toe: Secondary | ICD-10-CM

## 2016-05-20 MED ORDER — LEVOFLOXACIN 750 MG PO TABS: 750.0000 mg | ORAL_TABLET | Freq: Every day | ORAL | 1 refills | Status: DC

## 2016-05-20 MED ORDER — CEPHALEXIN 500 MG PO CAPS
500.0000 mg | ORAL_CAPSULE | Freq: Four times a day (QID) | ORAL | 1 refills | Status: DC
Start: 2016-05-20 — End: 2016-06-08

## 2016-05-20 NOTE — Patient Instructions (Signed)

## 2016-05-20 NOTE — Progress Notes (Signed)
Mr. [name] presents today for surgical consult regarding amputation second digit of the left foot. He states that it seems to be doing better but is not well yet. As he refers to the PIPJ second digit left foot. He states that the redness has subsided and actually looked as it was going to heal this past weekend but now it looks worse again.  Objective: Vital signs are stable he is alert and oriented 3 and have reviewed his past medical history medications allergies surgeries and social history. At this point the toe appears to be red he still has a ulceration to the medial aspect of the second toe. I see no cellulitis to the dorsal aspect of the foot. He is not complaining of any calf pain or chest pain.  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  Plan: We went over surgical consent form today consisting of amputation of the second metatarsophalangeal joint left foot. I answered all the questions regarding this seated to the best of my ability in layman's terms. He understood it was amenable to it and find out through pages of the consent form. We did discuss a possible postop complications which include but are not limited to postop pain bleeding swelling infection recurrence need for further surgery also digit loss of limb loss of life. He understood this and signed out through pages of the consent form. I also refilled his antibiotics and started him on Levaquin as well. I will follow-up with him in a few weeks for surgery. I will follow-up with him in a couple weeks for reevaluation of the toe surgery has not been performed at that time.

## 2016-05-25 ENCOUNTER — Telehealth: Payer: Self-pay | Admitting: *Deleted

## 2016-05-25 NOTE — Telephone Encounter (Signed)
ok 

## 2016-05-25 NOTE — Telephone Encounter (Signed)
Pt states he is tentatively set up for surgery 06/05/2016, but this weekend his toe hurt him so bad he had to go to the ER and they switched the antibiotic to Doxycycline and he feels much better. Pt states the ER doctor told him to make an appt with Dr. Milinda Pointer for this week. Transferred to Schedulers. Messaged Dr. Milinda Pointer with new medication.

## 2016-05-25 NOTE — Telephone Encounter (Signed)
Pt had lvm on my ext at 1052am. I called back and left a message for pt to call me back.

## 2016-05-27 ENCOUNTER — Encounter: Payer: Self-pay | Admitting: Podiatry

## 2016-05-27 ENCOUNTER — Ambulatory Visit (INDEPENDENT_AMBULATORY_CARE_PROVIDER_SITE_OTHER): Payer: Medicare Other | Admitting: Podiatry

## 2016-05-27 ENCOUNTER — Telehealth: Payer: Self-pay | Admitting: *Deleted

## 2016-05-27 DIAGNOSIS — L97521 Non-pressure chronic ulcer of other part of left foot limited to breakdown of skin: Secondary | ICD-10-CM | POA: Diagnosis not present

## 2016-05-27 MED ORDER — LEVOFLOXACIN 750 MG PO TABS: 750.0000 mg | ORAL_TABLET | Freq: Every day | ORAL | 1 refills | Status: DC

## 2016-05-27 NOTE — Telephone Encounter (Signed)
I recultured today.  There was no culture done that said mrsa according to patient doc said looked like mrsa.  So we might still be able to do it.  I need a negative culture though

## 2016-05-27 NOTE — Telephone Encounter (Signed)
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  Surgery will not be able to be performed here.  He's scheduled for 06/05/2016."  I'll let Dr. Milinda Pointer know.

## 2016-05-27 NOTE — Progress Notes (Signed)
He presents today after having gone to urgent care over the weekend for osteomyelitis of the second digit left foot. The doctor if there is an care suggested that this looks like MRSA and put him on doxycycline discontinuing the clindamycin that I had put him on. He also recommended that he continue the Iodosorb gel and continue the Levaquin. Mr. Schetter took upon himself to cancel his surgery for next Friday regarding" of his toe. He states that he would like to hold off until he talk to his hematologist.  Objective: Vital signs are stable he is alert and oriented 3. Pulses are palpable. Neurologic sensorium is intact. Erythema has diminished considerably from the second digit of the left foot I am able to see capsule today so I cultured the capsule even that we have been on antibiotics if there is MRSA present patient still be present.  Assessment osteomyelitis second digit left foot.  Plan: Cultured sensitivity was performed today with debridement and I will follow-up with him Monday just to make sure that this is doing well.

## 2016-06-01 ENCOUNTER — Encounter: Payer: Self-pay | Admitting: Podiatry

## 2016-06-01 ENCOUNTER — Ambulatory Visit (INDEPENDENT_AMBULATORY_CARE_PROVIDER_SITE_OTHER): Payer: Medicare Other | Admitting: Podiatry

## 2016-06-01 DIAGNOSIS — M86172 Other acute osteomyelitis, left ankle and foot: Secondary | ICD-10-CM

## 2016-06-01 DIAGNOSIS — L97521 Non-pressure chronic ulcer of other part of left foot limited to breakdown of skin: Secondary | ICD-10-CM

## 2016-06-01 MED ORDER — DOXYCYCLINE HYCLATE 100 MG PO TABS: 100.0000 mg | ORAL_TABLET | Freq: Two times a day (BID) | ORAL | 0 refills | Status: DC

## 2016-06-01 NOTE — Progress Notes (Signed)
He presents today for follow-up of ulceration and osteomyelitis second digit of the left foot. At this point are pathology has not returned. He states the toe is doing much better and that erupted once again coming cellulitic.  Objective: Vital signs are stable he is alert and oriented 3. Pulses are palpable. Osteomyelitis and ulceration medial aspect PIPJ second digit left foot.  Assessment: Cellulitis osteomyelitis second digit left.  Plan: Continue his antibiotics and will follow-up with him immediately upon receipt culture and sensitivity. We will consider surgical intervention Friday.

## 2016-06-02 ENCOUNTER — Telehealth: Payer: Self-pay | Admitting: *Deleted

## 2016-06-02 NOTE — Telephone Encounter (Signed)
Check with him again.  We need to know asap so the schedules can be adjusted.

## 2016-06-02 NOTE — Telephone Encounter (Signed)
Dr. Milinda Pointer wanted me to follow up with you regarding surgery.  Are you going to be able to have the surgery on Thursday?  "Yes, my girlfriend was able to get off.  I received a call from the surgery center and was told to be there at 1:45 pm."  Okay, that sounds great.  I will let Dr. Milinda Pointer know.

## 2016-06-02 NOTE — Telephone Encounter (Signed)
[place].

## 2016-06-02 NOTE — Telephone Encounter (Signed)
I am calling regarding your labs.  Dr. Milinda Pointer said you do not have MRSA.  He said he can do your surgery on Thursday afternoon.  "I can't do it on Thursday.  My girlfriend is off on Fridays.  I don't know if she can get off."  His schedule is full for Friday we may have to go out a week.  "No, I don't want to do that.  Let me call my girlfriend and get back with you.  I will see what I can work out."

## 2016-06-02 NOTE — Telephone Encounter (Signed)
Pt having 2nd toe on left foot amputated on Thursday- osteomyelitis. Wants to make sure Dr Alvy Bimler is OK with this surgery.

## 2016-06-03 ENCOUNTER — Telehealth: Payer: Self-pay | Admitting: *Deleted

## 2016-06-03 NOTE — Telephone Encounter (Signed)
Pls proceed with surgery

## 2016-06-03 NOTE — Telephone Encounter (Signed)
Informed pt ok to proceed w/ surgery from Dr. Calton Dach standpoint.  Pt verbalized understanding.  Confirmed his appt to see Dr. Alvy Bimler on Monday 11/13.

## 2016-06-04 ENCOUNTER — Encounter: Payer: Self-pay | Admitting: Podiatry

## 2016-06-04 ENCOUNTER — Other Ambulatory Visit: Payer: Self-pay | Admitting: Podiatry

## 2016-06-04 DIAGNOSIS — M86672 Other chronic osteomyelitis, left ankle and foot: Secondary | ICD-10-CM

## 2016-06-04 MED ORDER — HYDROMORPHONE HCL 4 MG PO TABS: 4.0000 mg | ORAL_TABLET | Freq: Four times a day (QID) | ORAL | 0 refills | Status: DC | PRN

## 2016-06-04 MED ORDER — PROMETHAZINE HCL 25 MG PO TABS: 25.0000 mg | ORAL_TABLET | Freq: Three times a day (TID) | ORAL | 0 refills | Status: DC | PRN

## 2016-06-05 ENCOUNTER — Other Ambulatory Visit: Payer: Self-pay | Admitting: Hematology and Oncology

## 2016-06-05 DIAGNOSIS — C9001 Multiple myeloma in remission: Secondary | ICD-10-CM

## 2016-06-08 ENCOUNTER — Other Ambulatory Visit (HOSPITAL_BASED_OUTPATIENT_CLINIC_OR_DEPARTMENT_OTHER): Payer: Medicare Other

## 2016-06-08 ENCOUNTER — Ambulatory Visit (HOSPITAL_BASED_OUTPATIENT_CLINIC_OR_DEPARTMENT_OTHER): Payer: Medicare Other | Admitting: Hematology and Oncology

## 2016-06-08 ENCOUNTER — Telehealth: Payer: Self-pay | Admitting: Hematology and Oncology

## 2016-06-08 ENCOUNTER — Ambulatory Visit: Payer: Medicare Other | Admitting: Podiatry

## 2016-06-08 ENCOUNTER — Ambulatory Visit: Payer: Medicare Other

## 2016-06-08 ENCOUNTER — Encounter: Payer: Self-pay | Admitting: Hematology and Oncology

## 2016-06-08 VITALS — BP 121/78 | HR 90 | Temp 98.0°F | Resp 18 | Wt 185.4 lb

## 2016-06-08 DIAGNOSIS — C9001 Multiple myeloma in remission: Secondary | ICD-10-CM

## 2016-06-08 DIAGNOSIS — D696 Thrombocytopenia, unspecified: Secondary | ICD-10-CM | POA: Diagnosis not present

## 2016-06-08 DIAGNOSIS — M869 Osteomyelitis, unspecified: Secondary | ICD-10-CM

## 2016-06-08 DIAGNOSIS — Z95828 Presence of other vascular implants and grafts: Secondary | ICD-10-CM

## 2016-06-08 LAB — CBC WITH DIFFERENTIAL/PLATELET
BASO%: 0.9 % (ref 0.0–2.0)
BASOS ABS: 0 10*3/uL (ref 0.0–0.1)
EOS%: 12 % — ABNORMAL HIGH (ref 0.0–7.0)
Eosinophils Absolute: 0.5 10*3/uL (ref 0.0–0.5)
HEMATOCRIT: 42.2 % (ref 38.4–49.9)
HGB: 14.7 g/dL (ref 13.0–17.1)
LYMPH#: 1 10*3/uL (ref 0.9–3.3)
LYMPH%: 21.8 % (ref 14.0–49.0)
MCH: 34.2 pg — ABNORMAL HIGH (ref 27.2–33.4)
MCHC: 34.8 g/dL (ref 32.0–36.0)
MCV: 98.1 fL — ABNORMAL HIGH (ref 79.3–98.0)
MONO#: 1 10*3/uL — AB (ref 0.1–0.9)
MONO%: 21.8 % — ABNORMAL HIGH (ref 0.0–14.0)
NEUT#: 1.9 10*3/uL (ref 1.5–6.5)
NEUT%: 43.5 % (ref 39.0–75.0)
PLATELETS: 91 10*3/uL — AB (ref 140–400)
RBC: 4.3 10*6/uL (ref 4.20–5.82)
RDW: 14 % (ref 11.0–14.6)
WBC: 4.4 10*3/uL (ref 4.0–10.3)

## 2016-06-08 LAB — COMPREHENSIVE METABOLIC PANEL
ALK PHOS: 63 U/L (ref 40–150)
ALT: 18 U/L (ref 0–55)
ANION GAP: 10 meq/L (ref 3–11)
AST: 21 U/L (ref 5–34)
Albumin: 2.9 g/dL — ABNORMAL LOW (ref 3.5–5.0)
BILIRUBIN TOTAL: 1.12 mg/dL (ref 0.20–1.20)
BUN: 23.7 mg/dL (ref 7.0–26.0)
CALCIUM: 9.3 mg/dL (ref 8.4–10.4)
CHLORIDE: 106 meq/L (ref 98–109)
CO2: 25 mEq/L (ref 22–29)
CREATININE: 1.3 mg/dL (ref 0.7–1.3)
EGFR: 57 mL/min/{1.73_m2} — ABNORMAL LOW (ref >90)
Glucose: 110 mg/dl (ref 70–140)
Potassium: 3.3 mEq/L — ABNORMAL LOW (ref 3.5–5.1)
Sodium: 141 mEq/L (ref 136–145)
Total Protein: 6.2 g/dL — ABNORMAL LOW (ref 6.4–8.3)

## 2016-06-08 MED ORDER — HEPARIN SOD (PORK) LOCK FLUSH 100 UNIT/ML IV SOLN
500.0000 [IU] | Freq: Once | INTRAVENOUS | Status: AC | PRN
  Administered 2016-06-08: 500 [IU] via INTRAVENOUS
  Filled 2016-06-08: qty 5

## 2016-06-08 MED ORDER — SODIUM CHLORIDE 0.9 % IJ SOLN
10.0000 mL | INTRAMUSCULAR | Status: DC | PRN
  Administered 2016-06-08: 10 mL via INTRAVENOUS
  Filled 2016-06-08: qty 10

## 2016-06-08 NOTE — Assessment & Plan Note (Signed)
Due to recent infection, I recommend holding off Revlimid. He can restart his treatment on 06/26/2016 and I plan to see him back again at the end of next month for further review I would hold Zometa today due to recent bone surgery In the meantime, he will continue aspirin, calcium with vitamin D

## 2016-06-08 NOTE — Assessment & Plan Note (Signed)
This is multifactorial, could be induced by medications and recent surgery. He is not symptomatic. I recommend holding off Revlimid treatment to allow adequate recovery

## 2016-06-08 NOTE — Telephone Encounter (Signed)
GAVE PATIENT AVS REPORT AND APPOINTMENTS FOR December

## 2016-06-08 NOTE — Assessment & Plan Note (Signed)
He had recent toe surgery due to osteomyelitis I do not recommend resuming Zometa for now and his chemotherapy, to allow adequate healing of the bone before we proceed with treatment. He agreed

## 2016-06-08 NOTE — Progress Notes (Signed)
[facility] OFFICE PROGRESS NOTE  Patient Care Team: Jinny Sanders, MD as PCP - General Jeanann Lewandowsky, MD as Consulting Physician (Hematology and Oncology) Cathlean Marseilles, NP as Nurse Practitioner (Hematology and Oncology) Raynelle Bring, MD as Consulting Physician (Urology) Heath Lark, MD as Consulting Physician (Hematology and Oncology) [place], DPM as Consulting Physician (Podiatry) Kathaleen Bury, NP as Nurse Practitioner (Pain Medicine) Druscilla Brownie, MD as Consulting Physician (Dermatology) Bryson Ha, OD as Consulting Physician (Optometry)  SUMMARY OF ONCOLOGIC HISTORY:  Principle Diagnosis: Kappa light chain multiple myeloma, ISS III with diagnosis established in December 2012.  Prior Therapy:  1. Velcade, Cytoxan, and Decadron on a weekly basis from 07/10/2011 through 10/23/2011; 2. High-dose chemotherapy on 11/23/2011, consisting of carmustine 600 mg IV. He received cytarabine 400 mg IV on 12/24/2011, 12/25/2011, 12/26/2011, and 12/27/2011. He received etoposide 300 mg IV daily from 12/24/2011 through 12/27/2011, four doses. He received melphalan 280 mg IV on 12/28/2011.  3. 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  Current therapy: -Revlimid 5 mg daily, 3 weeks on, 1 week off. Revlimid was started on 05/11/2012.  Zometa was started on 09/02/2012.  Dental clearance was obtained.  Zometa 4 mg was placed on hold due to current osteomyelitis but restarted on 06/23/2013.    He also reports that his PSA is slowly rising and this may require future therapy by his urologist, Dr. Alinda Money.    INTERVAL HISTORY: Please see below for problem oriented charting. He returns today for further follow-up. He had recent toe amputation on November 9 for osteomyelitis. His wound is recovering well and his pain is under good control. The patient denies any recent signs or symptoms of bleeding such as spontaneous epistaxis, hematuria or  hematochezia.   REVIEW OF SYSTEMS:   Constitutional: Denies fevers, chills or abnormal weight loss Eyes: Denies blurriness of vision Ears, nose, mouth, throat, and face: Denies mucositis or sore throat Respiratory: Denies cough, dyspnea or wheezes Cardiovascular: Denies palpitation, chest discomfort or lower extremity swelling Gastrointestinal:  Denies nausea, heartburn or change in bowel habits Skin: Denies abnormal skin rashes Lymphatics: Denies new lymphadenopathy or easy bruising Neurological:Denies numbness, tingling or new weaknesses Behavioral/Psych: Mood is stable, no new changes  All other systems were reviewed with the patient and are negative.  I have reviewed the past medical history, past surgical history, social history and family history with the patient and they are unchanged from previous note.  ALLERGIES:  is allergic to celebrex [celecoxib]; cymbalta [duloxetine hcl]; hydromorphone hcl; meloxicam; oxycontin [oxycodone]; percocet [oxycodone-acetaminophen]; vicodin [hydrocodone-acetaminophen]; and lyrica [pregabalin].  MEDICATIONS:  Current Outpatient Prescriptions  Medication Sig Dispense Refill  . aspirin 81 MG tablet Take 81 mg by mouth daily.    . B Complex Vitamins (VITAMIN-B COMPLEX) TABS Take by mouth.    . Calcium Carbonate-Vitamin D 600-400 MG-UNIT per tablet Take by mouth.    . cholecalciferol (VITAMIN D) 1000 UNITS tablet Take 1,000 Units by mouth daily.    Marland Kitchen doxycycline (VIBRA-TABS) 100 MG tablet Take 1 tablet (100 mg total) by mouth 2 (two) times daily. 20 tablet 0  . fentaNYL (DURAGESIC - DOSED MCG/HR) 50 MCG/HR Place 1 patch (50 mcg total) onto the skin every 3 (three) days. 10 patch 0  . lenalidomide (REVLIMID) 5 MG capsule Take one capsule by mouth daily for 21 days, then off 7 days. 21 capsule 0  . levofloxacin (LEVAQUIN) 750 MG tablet Take 1 tablet (750 mg total) by mouth daily.  14 tablet 1  . lidocaine-prilocaine (EMLA) cream Apply topically as  needed. Apply to port site one hour before treatment and cover with plastic wrap 30 g 2  . NONFORMULARY OR COMPOUNDED ITEM Baclofn/amitrp/ketamn cream topically to feet TID prn for neuropathy    . oxyCODONE (OXY IR/ROXICODONE) 5 MG immediate release tablet TK 1 T PO Q 4 H PRN P  0  . pantoprazole (PROTONIX) 40 MG tablet Take 40 mg by mouth daily.    . promethazine (PHENERGAN) 25 MG tablet Take 1 tablet (25 mg total) by mouth every 8 (eight) hours as needed for nausea or vomiting. 20 tablet 0   No current facility-administered medications for this visit.     PHYSICAL EXAMINATION: ECOG PERFORMANCE STATUS: 1 - Symptomatic but completely ambulatory  Vitals:   06/08/16 1233  BP: 121/78  Pulse: 90  Resp: 18  Temp: 98 F (36.7 C)   Filed Weights   06/08/16 1233  Weight: 185 lb 6.4 oz (84.1 kg)    GENERAL:alert, no distress and comfortable SKIN: skin color, texture, turgor are normal, no rashes or significant lesions EYES: normal, Conjunctiva are pink and non-injected, sclera clear Musculoskeletal:no cyanosis of digits and no clubbing  NEURO: alert & oriented x 3 with fluent speech, no focal motor/sensory deficits  LABORATORY DATA:  I have reviewed the data as listed    Component Value Date/Time   NA 141 06/08/2016 1207   K 3.3 (L) 06/08/2016 1207   CL 105 04/12/2014 1041   CL 108 (H) 09/30/2013 0945   CL 110 (H) 12/29/2012 1151   CO2 25 06/08/2016 1207   GLUCOSE 110 06/08/2016 1207   GLUCOSE 127 (H) 12/29/2012 1151   BUN 23.7 06/08/2016 1207   CREATININE 1.3 06/08/2016 1207   CALCIUM 9.3 06/08/2016 1207   PROT 6.2 (L) 06/08/2016 1207   ALBUMIN 2.9 (L) 06/08/2016 1207   AST 21 06/08/2016 1207   ALT 18 06/08/2016 1207   ALKPHOS 63 06/08/2016 1207   BILITOT 1.12 06/08/2016 1207   GFRNONAA >60 09/30/2013 0945   GFRAA >60 09/30/2013 0945    No results found for: SPEP, UPEP  Lab Results  Component Value Date   WBC 4.4 06/08/2016   NEUTROABS 1.9 06/08/2016   HGB 14.7  06/08/2016   HCT 42.2 06/08/2016   MCV 98.1 (H) 06/08/2016   PLT 91 (L) 06/08/2016      Chemistry      Component Value Date/Time   NA 141 06/08/2016 1207   K 3.3 (L) 06/08/2016 1207   CL 105 04/12/2014 1041   CL 108 (H) 09/30/2013 0945   CL 110 (H) 12/29/2012 1151   CO2 25 06/08/2016 1207   BUN 23.7 06/08/2016 1207   CREATININE 1.3 06/08/2016 1207      Component Value Date/Time   CALCIUM 9.3 06/08/2016 1207   ALKPHOS 63 06/08/2016 1207   AST 21 06/08/2016 1207   ALT 18 06/08/2016 1207   BILITOT 1.12 06/08/2016 1207      ASSESSMENT & PLAN:  Multiple myeloma in remission ([place]) Due to recent infection, I recommend holding off Revlimid. He can restart his treatment on 06/26/2016 and I plan to see him back again at the end of next month for further review I would hold Zometa today due to recent bone surgery In the meantime, he will continue aspirin, calcium with vitamin D  Thrombocytopenia ([place]) This is multifactorial, could be induced by medications and recent surgery. He is not symptomatic. I recommend holding off  Revlimid treatment to allow adequate recovery  Osteomyelitis of toe of left foot ([place]) He had recent toe surgery due to osteomyelitis I do not recommend resuming Zometa for now and his chemotherapy, to allow adequate healing of the bone before we proceed with treatment. He agreed   No orders of the defined types were placed in this encounter.  All questions were answered. The patient knows to call the clinic with any problems, questions or concerns. No barriers to learning was detected. I spent 15 minutes counseling the patient face to face. The total time spent in the appointment was 20 minutes and more than 50% was on counseling and review of test results     Heath Lark, MD 06/08/2016 1:25 PM

## 2016-06-09 LAB — KAPPA/LAMBDA LIGHT CHAINS
IG KAPPA FREE LIGHT CHAIN: 39.2 mg/L — AB (ref 3.3–19.4)
IG LAMBDA FREE LIGHT CHAIN: 33.2 mg/L — AB (ref 5.7–26.3)
Kappa/Lambda FluidC Ratio: 1.18 (ref 0.26–1.65)

## 2016-06-10 ENCOUNTER — Ambulatory Visit (INDEPENDENT_AMBULATORY_CARE_PROVIDER_SITE_OTHER): Payer: Medicare Other

## 2016-06-10 ENCOUNTER — Ambulatory Visit (INDEPENDENT_AMBULATORY_CARE_PROVIDER_SITE_OTHER): Payer: Medicare Other | Admitting: Podiatry

## 2016-06-10 ENCOUNTER — Encounter: Payer: Self-pay | Admitting: Podiatry

## 2016-06-10 VITALS — BP 129/84 | HR 70 | Resp 16

## 2016-06-10 DIAGNOSIS — L03032 Cellulitis of left toe: Secondary | ICD-10-CM

## 2016-06-10 DIAGNOSIS — Z9889 Other specified postprocedural states: Secondary | ICD-10-CM

## 2016-06-10 DIAGNOSIS — L02612 Cutaneous abscess of left foot: Secondary | ICD-10-CM

## 2016-06-10 DIAGNOSIS — L97521 Non-pressure chronic ulcer of other part of left foot limited to breakdown of skin: Secondary | ICD-10-CM

## 2016-06-10 LAB — MULTIPLE MYELOMA PANEL, SERUM
ALBUMIN/GLOB SERPL: 1.2 (ref 0.7–1.7)
ALPHA2 GLOB SERPL ELPH-MCNC: 0.6 g/dL (ref 0.4–1.0)
Albumin SerPl Elph-Mcnc: 3.1 g/dL (ref 2.9–4.4)
Alpha 1: 0.2 g/dL (ref 0.0–0.4)
B-GLOBULIN SERPL ELPH-MCNC: 0.7 g/dL (ref 0.7–1.3)
GAMMA GLOB SERPL ELPH-MCNC: 1 g/dL (ref 0.4–1.8)
GLOBULIN, TOTAL: 2.6 g/dL (ref 2.2–3.9)
IGG (IMMUNOGLOBIN G), SERUM: 976 mg/dL (ref 700–1600)
IgA, Qn, Serum: 202 mg/dL (ref 61–437)
IgM, Qn, Serum: 19 mg/dL — ABNORMAL LOW (ref 20–172)
TOTAL PROTEIN: 5.7 g/dL — AB (ref 6.0–8.5)

## 2016-06-10 NOTE — Progress Notes (Signed)
He presents today for his first postop visit status post amputation second toe at the level of the metatarsophalangeal joints 5 days. He denies fever chills nausea vomiting muscle aches and pains and states that it feels much better already.  Objective: Dry sterile dressing intact was removed demonstrates no erythema and minimal edema no cellulitis drainage or odor sutures are intact margins well coapted radiographs taken today demonstrate complete resection of the toe the medical tarsal head still intact. I see no signs of osteomyelitis or gas infiltration.  Assessment: Well-healed surgical foot.  Plan: Redress today dressing present dressing I will follow-up with him in a week and a half.

## 2016-06-11 ENCOUNTER — Other Ambulatory Visit: Payer: Self-pay | Admitting: *Deleted

## 2016-06-11 MED ORDER — LENALIDOMIDE 5 MG PO CAPS: ORAL_CAPSULE | ORAL | 0 refills | Status: DC

## 2016-06-12 ENCOUNTER — Encounter: Payer: Medicare Other | Admitting: Podiatry

## 2016-06-22 ENCOUNTER — Encounter: Payer: Self-pay | Admitting: Podiatry

## 2016-06-22 ENCOUNTER — Ambulatory Visit (INDEPENDENT_AMBULATORY_CARE_PROVIDER_SITE_OTHER): Payer: Medicare Other | Admitting: Podiatry

## 2016-06-22 DIAGNOSIS — M79676 Pain in unspecified toe(s): Secondary | ICD-10-CM

## 2016-06-22 DIAGNOSIS — Z9889 Other specified postprocedural states: Secondary | ICD-10-CM

## 2016-06-22 DIAGNOSIS — L02612 Cutaneous abscess of left foot: Secondary | ICD-10-CM

## 2016-06-22 DIAGNOSIS — B351 Tinea unguium: Secondary | ICD-10-CM

## 2016-06-22 DIAGNOSIS — L03032 Cellulitis of left toe: Secondary | ICD-10-CM

## 2016-06-22 DIAGNOSIS — L97521 Non-pressure chronic ulcer of other part of left foot limited to breakdown of skin: Secondary | ICD-10-CM

## 2016-06-23 NOTE — Progress Notes (Signed)
He presents today for suture removal status post 2-1/2 weeks amputation second toe left foot. He states that he is doing great however his toenails are long and painful.  Objective: Vital signs are stable alert and oriented 3. Pulses are palpable. No erythema cellulitis drainage or odor no skin breakdown. Sutures are in place margins are well coapted sutures are removed and margins remain well coapted. No signs of infection and he states that he is doing great with no pain. His toenails are long thick yellow dystrophic onychomycotic painful palpation as well as debridement.  Assessment: Well healing amputation site second digit left foot. Pain in limb seen in onychomycosis.  Plan: Debrided all nails 1 through 5 bilateral. A and removed sutures today. I will allow him start getting this wet and washing thoroughly I will follow-up with him in 1 month. He will notify S with questions or concerns.

## 2016-06-24 ENCOUNTER — Encounter: Payer: Self-pay | Admitting: Podiatry

## 2016-06-30 ENCOUNTER — Telehealth: Payer: Self-pay | Admitting: *Deleted

## 2016-06-30 NOTE — Telephone Encounter (Signed)
Left message informing pt I would answer by email.

## 2016-07-03 ENCOUNTER — Encounter: Payer: Self-pay | Admitting: Podiatry

## 2016-07-10 NOTE — Progress Notes (Signed)
DOS 11.09.2017 Disarticulation of the 2nd Toe Left Foot

## 2016-07-13 ENCOUNTER — Ambulatory Visit (INDEPENDENT_AMBULATORY_CARE_PROVIDER_SITE_OTHER): Payer: Self-pay | Admitting: Podiatry

## 2016-07-13 ENCOUNTER — Encounter: Payer: Self-pay | Admitting: Podiatry

## 2016-07-13 DIAGNOSIS — L02612 Cutaneous abscess of left foot: Secondary | ICD-10-CM

## 2016-07-13 DIAGNOSIS — Z9889 Other specified postprocedural states: Secondary | ICD-10-CM

## 2016-07-13 DIAGNOSIS — L97521 Non-pressure chronic ulcer of other part of left foot limited to breakdown of skin: Secondary | ICD-10-CM

## 2016-07-13 DIAGNOSIS — L03032 Cellulitis of left toe: Secondary | ICD-10-CM

## 2016-07-13 NOTE — Progress Notes (Signed)
He presents today for amputation follow-up second digit left foot. Surgery was June 04 2016 mL doing fine however he's noticed a red spot on the medial aspect of his fifth digit left foot.  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  Assessment: Well-healing surgical foot concerned about neuropathy and new breakdown.  Plan: Follow up with him in 2-3 weeks he will notify me sooner if needed.

## 2016-07-16 ENCOUNTER — Other Ambulatory Visit: Payer: Self-pay | Admitting: *Deleted

## 2016-07-16 MED ORDER — LENALIDOMIDE 5 MG PO CAPS: ORAL_CAPSULE | ORAL | 0 refills | Status: DC

## 2016-07-24 ENCOUNTER — Telehealth: Payer: Self-pay | Admitting: Hematology and Oncology

## 2016-07-24 ENCOUNTER — Ambulatory Visit (HOSPITAL_BASED_OUTPATIENT_CLINIC_OR_DEPARTMENT_OTHER): Payer: Medicare Other | Admitting: Hematology and Oncology

## 2016-07-24 ENCOUNTER — Ambulatory Visit: Payer: Medicare Other

## 2016-07-24 ENCOUNTER — Ambulatory Visit (HOSPITAL_BASED_OUTPATIENT_CLINIC_OR_DEPARTMENT_OTHER): Payer: Medicare Other

## 2016-07-24 ENCOUNTER — Other Ambulatory Visit (HOSPITAL_BASED_OUTPATIENT_CLINIC_OR_DEPARTMENT_OTHER): Payer: Medicare Other

## 2016-07-24 ENCOUNTER — Encounter: Payer: Self-pay | Admitting: Hematology and Oncology

## 2016-07-24 ENCOUNTER — Other Ambulatory Visit: Payer: Self-pay | Admitting: Hematology and Oncology

## 2016-07-24 DIAGNOSIS — C9001 Multiple myeloma in remission: Secondary | ICD-10-CM | POA: Diagnosis present

## 2016-07-24 DIAGNOSIS — C61 Malignant neoplasm of prostate: Secondary | ICD-10-CM | POA: Diagnosis not present

## 2016-07-24 DIAGNOSIS — Z95828 Presence of other vascular implants and grafts: Secondary | ICD-10-CM

## 2016-07-24 DIAGNOSIS — D696 Thrombocytopenia, unspecified: Secondary | ICD-10-CM | POA: Diagnosis not present

## 2016-07-24 LAB — CBC WITH DIFFERENTIAL/PLATELET
BASO%: 1.4 % (ref 0.0–2.0)
BASOS ABS: 0.1 10*3/uL (ref 0.0–0.1)
EOS ABS: 0.1 10*3/uL (ref 0.0–0.5)
EOS%: 1.6 % (ref 0.0–7.0)
HCT: 42.1 % (ref 38.4–49.9)
HGB: 14.7 g/dL (ref 13.0–17.1)
LYMPH%: 24.1 % (ref 14.0–49.0)
MCH: 34.5 pg — AB (ref 27.2–33.4)
MCHC: 34.9 g/dL (ref 32.0–36.0)
MCV: 98.8 fL — AB (ref 79.3–98.0)
MONO#: 0.7 10*3/uL (ref 0.1–0.9)
MONO%: 13.7 % (ref 0.0–14.0)
NEUT#: 2.9 10*3/uL (ref 1.5–6.5)
NEUT%: 59.2 % (ref 39.0–75.0)
Platelets: 136 10*3/uL — ABNORMAL LOW (ref 140–400)
RBC: 4.26 10*6/uL (ref 4.20–5.82)
RDW: 14.2 % (ref 11.0–14.6)
WBC: 4.9 10*3/uL (ref 4.0–10.3)
lymph#: 1.2 10*3/uL (ref 0.9–3.3)

## 2016-07-24 LAB — COMPREHENSIVE METABOLIC PANEL
ALT: 17 U/L (ref 0–55)
AST: 20 U/L (ref 5–34)
Albumin: 3.4 g/dL — ABNORMAL LOW (ref 3.5–5.0)
Alkaline Phosphatase: 56 U/L (ref 40–150)
Anion Gap: 9 mEq/L (ref 3–11)
BUN: 21.8 mg/dL (ref 7.0–26.0)
CALCIUM: 9.4 mg/dL (ref 8.4–10.4)
CHLORIDE: 110 meq/L — AB (ref 98–109)
CO2: 23 meq/L (ref 22–29)
CREATININE: 1.2 mg/dL (ref 0.7–1.3)
EGFR: 62 mL/min/{1.73_m2} — ABNORMAL LOW (ref >90)
GLUCOSE: 99 mg/dL (ref 70–140)
Potassium: 4 mEq/L (ref 3.5–5.1)
Sodium: 141 mEq/L (ref 136–145)
Total Bilirubin: 0.95 mg/dL (ref 0.20–1.20)
Total Protein: 6.5 g/dL (ref 6.4–8.3)

## 2016-07-24 MED ORDER — SODIUM CHLORIDE 0.9 % IJ SOLN
10.0000 mL | INTRAMUSCULAR | Status: DC | PRN
  Administered 2016-07-24: 10 mL via INTRAVENOUS
  Filled 2016-07-24: qty 10

## 2016-07-24 MED ORDER — HEPARIN SOD (PORK) LOCK FLUSH 100 UNIT/ML IV SOLN
500.0000 [IU] | Freq: Once | INTRAVENOUS | Status: AC | PRN
  Administered 2016-07-24: 500 [IU] via INTRAVENOUS
  Filled 2016-07-24: qty 5

## 2016-07-24 MED ORDER — SODIUM CHLORIDE 0.9 % IV SOLN
Freq: Once | INTRAVENOUS | Status: AC
  Administered 2016-07-24: 13:00:00 via INTRAVENOUS

## 2016-07-24 MED ORDER — ZOLEDRONIC ACID 4 MG/100ML IV SOLN
4.0000 mg | Freq: Once | INTRAVENOUS | Status: AC
  Administered 2016-07-24: 4 mg via INTRAVENOUS
  Filled 2016-07-24: qty 100

## 2016-07-24 NOTE — Assessment & Plan Note (Signed)
This is multifactorial, could be induced by medications and recent surgery. He is not symptomatic. Recommend close observation only

## 2016-07-24 NOTE — Progress Notes (Signed)
[facility] OFFICE PROGRESS NOTE  Patient Care Team: Jinny Sanders, MD as PCP - General Jeanann Lewandowsky, MD as Consulting Physician (Hematology and Oncology) Cathlean [place], NP as Nurse Practitioner (Hematology and Oncology) Raynelle Bring, MD as Consulting Physician (Urology) Heath Lark, MD as Consulting Physician (Hematology and Oncology) [place], DPM as Consulting Physician (Podiatry) Kathaleen Bury, NP as Nurse Practitioner (Pain Medicine) Druscilla Brownie, MD as Consulting Physician (Dermatology) Bryson Ha, OD as Consulting Physician (Optometry)  SUMMARY OF ONCOLOGIC HISTORY:  Principle Diagnosis: Kappa light chain multiple myeloma, ISS III with diagnosis established in December 2012.  Prior Therapy:  1. Velcade, Cytoxan, and Decadron on a weekly basis from 07/10/2011 through 10/23/2011; 2. High-dose chemotherapy on 11/23/2011, consisting of carmustine 600 mg IV. He received cytarabine 400 mg IV on 12/24/2011, 12/25/2011, 12/26/2011, and 12/27/2011. He received etoposide 300 mg IV daily from 12/24/2011 through 12/27/2011, four doses. He received melphalan 280 mg IV on 12/28/2011.  3. 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  Current therapy: -Revlimid 5 mg daily, 3 weeks on, 1 week off. Revlimid was started on 05/11/2012.  Zometa was started on 09/02/2012.  Dental clearance was obtained.  Zometa 4 mg was placed on hold due to current osteomyelitis but restarted on 06/23/2013.    He also reports that his PSA is slowly rising and this may require future therapy by his urologist, Dr. Alinda Money.   INTERVAL HISTORY: Please see below for problem oriented charting. He returns today for further follow-up. His wound is recovering well from recent toe surgery and his pain is under good control. The patient denies any recent signs or symptoms of bleeding such as spontaneous epistaxis, hematuria or hematochezia.  REVIEW OF SYSTEMS:    Constitutional: Denies fevers, chills or abnormal weight loss Eyes: Denies blurriness of vision Ears, nose, mouth, throat, and face: Denies mucositis or sore throat Respiratory: Denies cough, dyspnea or wheezes Cardiovascular: Denies palpitation, chest discomfort or lower extremity swelling Gastrointestinal:  Denies nausea, heartburn or change in bowel habits Skin: Denies abnormal skin rashes Lymphatics: Denies new lymphadenopathy or easy bruising Neurological:Denies numbness, tingling or new weaknesses Behavioral/Psych: Mood is stable, no new changes  All other systems were reviewed with the patient and are negative.  I have reviewed the past medical history, past surgical history, social history and family history with the patient and they are unchanged from previous note.  ALLERGIES:  is allergic to celebrex [celecoxib]; cymbalta [duloxetine hcl]; hydromorphone hcl; meloxicam; oxycontin [oxycodone]; percocet [oxycodone-acetaminophen]; vicodin [hydrocodone-acetaminophen]; and lyrica [pregabalin].  MEDICATIONS:  Current Outpatient Prescriptions  Medication Sig Dispense Refill  . aspirin 81 MG tablet Take 81 mg by mouth daily.    . B Complex Vitamins (VITAMIN-B COMPLEX) TABS Take by mouth.    . Calcium Carbonate-Vitamin D 600-400 MG-UNIT per tablet Take by mouth.    . cholecalciferol (VITAMIN D) 1000 UNITS tablet Take 1,000 Units by mouth daily.    Marland Kitchen doxycycline (VIBRA-TABS) 100 MG tablet Take 1 tablet (100 mg total) by mouth 2 (two) times daily. 20 tablet 0  . fentaNYL (DURAGESIC - DOSED MCG/HR) 50 MCG/HR Place 1 patch (50 mcg total) onto the skin every 3 (three) days. 10 patch 0  . lenalidomide (REVLIMID) 5 MG capsule Take one capsule by mouth daily for 21 days, then off 7 days. 21 capsule 0  . lidocaine-prilocaine (EMLA) cream Apply topically as needed. Apply to port site one hour before treatment and cover with plastic wrap 30 g  2  . NONFORMULARY OR COMPOUNDED ITEM  Baclofn/amitrp/ketamn cream topically to feet TID prn for neuropathy    . oxyCODONE (OXY IR/ROXICODONE) 5 MG immediate release tablet TK 1 T PO Q 4 H PRN P  0  . pantoprazole (PROTONIX) 40 MG tablet Take 40 mg by mouth daily.    . promethazine (PHENERGAN) 25 MG tablet Take 1 tablet (25 mg total) by mouth every 8 (eight) hours as needed for nausea or vomiting. 20 tablet 0   No current facility-administered medications for this visit.     PHYSICAL EXAMINATION: ECOG PERFORMANCE STATUS: 1 - Symptomatic but completely ambulatory  Vitals:   07/24/16 1238  BP: 115/70  Pulse: 74  Resp: 18  Temp: 97.6 F (36.4 C)   Filed Weights   07/24/16 1238  Weight: 186 lb 11.2 oz (84.7 kg)    GENERAL:alert, no distress and comfortable SKIN: skin color, texture, turgor are normal, no rashes or significant lesions EYES: normal, Conjunctiva are pink and non-injected, sclera clear OROPHARYNX:no exudate, no erythema and lips, buccal mucosa, and tongue normal  NECK: supple, thyroid normal size, non-tender, without nodularity LYMPH:  no palpable lymphadenopathy in the cervical, axillary or inguinal LUNGS: clear to auscultation and percussion with normal breathing effort HEART: regular rate & rhythm and no murmurs and no lower extremity edema ABDOMEN:abdomen soft, non-tender and normal bowel sounds Musculoskeletal:no cyanosis of digits and no clubbing  NEURO: alert & oriented x 3 with fluent speech, no focal motor/sensory deficits  LABORATORY DATA:  I have reviewed the data as listed    Component Value Date/Time   NA 141 07/24/2016 1216   K 4.0 07/24/2016 1216   CL 105 04/12/2014 1041   CL 108 (H) 09/30/2013 0945   CL 110 (H) 12/29/2012 1151   CO2 23 07/24/2016 1216   GLUCOSE 99 07/24/2016 1216   GLUCOSE 127 (H) 12/29/2012 1151   BUN 21.8 07/24/2016 1216   CREATININE 1.2 07/24/2016 1216   CALCIUM 9.4 07/24/2016 1216   PROT 6.5 07/24/2016 1216   ALBUMIN 3.4 (L) 07/24/2016 1216   AST 20  07/24/2016 1216   ALT 17 07/24/2016 1216   ALKPHOS 56 07/24/2016 1216   BILITOT 0.95 07/24/2016 1216   GFRNONAA >60 09/30/2013 0945   GFRAA >60 09/30/2013 0945    No results found for: SPEP, UPEP  Lab Results  Component Value Date   WBC 4.9 07/24/2016   NEUTROABS 2.9 07/24/2016   HGB 14.7 07/24/2016   HCT 42.1 07/24/2016   MCV 98.8 (H) 07/24/2016   PLT 136 (L) 07/24/2016      Chemistry      Component Value Date/Time   NA 141 07/24/2016 1216   K 4.0 07/24/2016 1216   CL 105 04/12/2014 1041   CL 108 (H) 09/30/2013 0945   CL 110 (H) 12/29/2012 1151   CO2 23 07/24/2016 1216   BUN 21.8 07/24/2016 1216   CREATININE 1.2 07/24/2016 1216      Component Value Date/Time   CALCIUM 9.4 07/24/2016 1216   ALKPHOS 56 07/24/2016 1216   AST 20 07/24/2016 1216   ALT 17 07/24/2016 1216   BILITOT 0.95 07/24/2016 1216     ASSESSMENT & PLAN:  Multiple myeloma in remission ([place]) His recent toe infection has healed. He has resumed treatment without problems. In the meantime, he will continue aspirin, calcium with vitamin D He denies recent dental issues. He will continue Zometa every 3 months  Thrombocytopenia ([place]) This is multifactorial, could be induced by medications  and recent surgery. He is not symptomatic. Recommend close observation only  Malignant neoplasm of prostate [facility]) He had recent elevated PSA and is currently undergoing evaluation by urologist. I will defer to them for further management.    No orders of the defined types were placed in this encounter.  All questions were answered. The patient knows to call the clinic with any problems, questions or concerns. No barriers to learning was detected. I spent 15 minutes counseling the patient face to face. The total time spent in the appointment was 20 minutes and more than 50% was on counseling and review of test results     Heath Lark, MD 07/24/2016 1:13 PM

## 2016-07-24 NOTE — Assessment & Plan Note (Signed)
He had recent elevated PSA and is currently undergoing evaluation by urologist. I will defer to them for further management.

## 2016-07-24 NOTE — Assessment & Plan Note (Signed)
His recent toe infection has healed. He has resumed treatment without problems. In the meantime, he will continue aspirin, calcium with vitamin D He denies recent dental issues. He will continue Zometa every 3 months

## 2016-07-24 NOTE — Patient Instructions (Signed)

## 2016-07-24 NOTE — Telephone Encounter (Signed)
Gave patient avs report and appointments for February and April

## 2016-07-28 LAB — KAPPA/LAMBDA LIGHT CHAINS
IG LAMBDA FREE LIGHT CHAIN: 27.1 mg/L — AB (ref 5.7–26.3)
Ig Kappa Free Light Chain: 25.5 mg/L — ABNORMAL HIGH (ref 3.3–19.4)
Kappa/Lambda FluidC Ratio: 0.94 (ref 0.26–1.65)

## 2016-07-29 LAB — MULTIPLE MYELOMA PANEL, SERUM
ALPHA 1: 0.2 g/dL (ref 0.0–0.4)
Albumin SerPl Elph-Mcnc: 3.3 g/dL (ref 2.9–4.4)
Albumin/Glob SerPl: 1.3 (ref 0.7–1.7)
Alpha2 Glob SerPl Elph-Mcnc: 0.6 g/dL (ref 0.4–1.0)
B-Globulin SerPl Elph-Mcnc: 0.8 g/dL (ref 0.7–1.3)
GAMMA GLOB SERPL ELPH-MCNC: 1 g/dL (ref 0.4–1.8)
Globulin, Total: 2.7 g/dL (ref 2.2–3.9)
IgA, Qn, Serum: 176 mg/dL (ref 61–437)
IgM, Qn, Serum: 17 mg/dL — ABNORMAL LOW (ref 20–172)
Total Protein: 6 g/dL (ref 6.0–8.5)

## 2016-08-10 ENCOUNTER — Encounter: Payer: Self-pay | Admitting: Gastroenterology

## 2016-08-13 ENCOUNTER — Other Ambulatory Visit: Payer: Self-pay | Admitting: *Deleted

## 2016-08-13 MED ORDER — LENALIDOMIDE 5 MG PO CAPS
ORAL_CAPSULE | ORAL | 0 refills | Status: DC
Start: 2016-08-13 — End: 2016-09-11

## 2016-08-13 NOTE — Telephone Encounter (Signed)
Pt left VM states needs refill on Revlimid.  Took last pill today.  Refill sent electronically to [facility].

## 2016-08-17 ENCOUNTER — Telehealth: Payer: Self-pay

## 2016-08-17 NOTE — Telephone Encounter (Signed)
Patient called to inquire about Revlimid Rx. Informed patient, rx was sent to [facility].

## 2016-09-04 ENCOUNTER — Ambulatory Visit (HOSPITAL_BASED_OUTPATIENT_CLINIC_OR_DEPARTMENT_OTHER): Payer: Medicare Other

## 2016-09-04 DIAGNOSIS — C9001 Multiple myeloma in remission: Secondary | ICD-10-CM

## 2016-09-04 DIAGNOSIS — Z95828 Presence of other vascular implants and grafts: Secondary | ICD-10-CM

## 2016-09-04 DIAGNOSIS — Z452 Encounter for adjustment and management of vascular access device: Secondary | ICD-10-CM | POA: Diagnosis present

## 2016-09-04 MED ORDER — HEPARIN SOD (PORK) LOCK FLUSH 100 UNIT/ML IV SOLN
500.0000 [IU] | Freq: Once | INTRAVENOUS | Status: AC | PRN
  Administered 2016-09-04: 500 [IU] via INTRAVENOUS
  Filled 2016-09-04: qty 5

## 2016-09-04 MED ORDER — SODIUM CHLORIDE 0.9 % IJ SOLN
10.0000 mL | INTRAMUSCULAR | Status: DC | PRN
  Administered 2016-09-04: 10 mL via INTRAVENOUS
  Filled 2016-09-04: qty 10

## 2016-09-11 ENCOUNTER — Other Ambulatory Visit: Payer: Self-pay | Admitting: *Deleted

## 2016-09-11 MED ORDER — LENALIDOMIDE 5 MG PO CAPS: ORAL_CAPSULE | ORAL | 0 refills | Status: DC

## 2016-10-06 ENCOUNTER — Other Ambulatory Visit (HOSPITAL_COMMUNITY): Payer: Self-pay | Admitting: Urology

## 2016-10-06 DIAGNOSIS — C61 Malignant neoplasm of prostate: Secondary | ICD-10-CM

## 2016-10-08 ENCOUNTER — Telehealth: Payer: Self-pay | Admitting: *Deleted

## 2016-10-08 NOTE — Telephone Encounter (Signed)
"  [name] with [name]'s office [phone]) calling about a mutual patient.  Need to confirm appointment and obtain fax number to send orders.  Dr. Alinda Money would like a PSA, BUN and CREAT performed at the visit on [id number]." Provide lab fax number [phone].  No further questions. Lab appointment indicates outside orders in book.

## 2016-10-09 ENCOUNTER — Other Ambulatory Visit: Payer: Self-pay | Admitting: *Deleted

## 2016-10-09 MED ORDER — LENALIDOMIDE 5 MG PO CAPS: ORAL_CAPSULE | ORAL | 0 refills | Status: DC

## 2016-10-13 ENCOUNTER — Encounter: Payer: Self-pay | Admitting: Pharmacist

## 2016-10-13 NOTE — Progress Notes (Signed)
Fax received from [facility] stating pt approved for a $10,000 grant through multiple myeloma medicare access fund from 09/13/16 - 09/12/17.  Pharmacy Card ID# [id number] PC Group #[id number] PC BIN #[id number] PC PCN [id number]  Pt has Revlimid filled through [facility].  Kennith Center, Pharm.D., CPP 10/13/2016'@4' :[facility]

## 2016-10-19 DIAGNOSIS — M48062 Spinal stenosis, lumbar region with neurogenic claudication: Secondary | ICD-10-CM | POA: Insufficient documentation

## 2016-10-19 DIAGNOSIS — Z8579 Personal history of other malignant neoplasms of lymphoid, hematopoietic and related tissues: Secondary | ICD-10-CM | POA: Insufficient documentation

## 2016-10-28 ENCOUNTER — Other Ambulatory Visit: Payer: Self-pay

## 2016-10-28 DIAGNOSIS — C61 Malignant neoplasm of prostate: Secondary | ICD-10-CM

## 2016-10-28 DIAGNOSIS — C9001 Multiple myeloma in remission: Secondary | ICD-10-CM

## 2016-10-30 ENCOUNTER — Ambulatory Visit (HOSPITAL_BASED_OUTPATIENT_CLINIC_OR_DEPARTMENT_OTHER): Payer: Medicare Other | Admitting: Hematology and Oncology

## 2016-10-30 ENCOUNTER — Other Ambulatory Visit (HOSPITAL_BASED_OUTPATIENT_CLINIC_OR_DEPARTMENT_OTHER): Payer: Medicare Other

## 2016-10-30 ENCOUNTER — Ambulatory Visit (HOSPITAL_BASED_OUTPATIENT_CLINIC_OR_DEPARTMENT_OTHER): Payer: Medicare Other

## 2016-10-30 ENCOUNTER — Other Ambulatory Visit: Payer: Self-pay | Admitting: Hematology and Oncology

## 2016-10-30 ENCOUNTER — Encounter: Payer: Self-pay | Admitting: Hematology and Oncology

## 2016-10-30 ENCOUNTER — Ambulatory Visit: Payer: Medicare Other

## 2016-10-30 ENCOUNTER — Telehealth: Payer: Self-pay | Admitting: Hematology and Oncology

## 2016-10-30 VITALS — BP 127/65 | HR 70 | Temp 97.7°F | Resp 18 | Ht 73.0 in | Wt 191.8 lb

## 2016-10-30 DIAGNOSIS — N183 Chronic kidney disease, stage 3 unspecified: Secondary | ICD-10-CM

## 2016-10-30 DIAGNOSIS — C9001 Multiple myeloma in remission: Secondary | ICD-10-CM | POA: Diagnosis present

## 2016-10-30 DIAGNOSIS — C61 Malignant neoplasm of prostate: Secondary | ICD-10-CM

## 2016-10-30 DIAGNOSIS — Z95828 Presence of other vascular implants and grafts: Secondary | ICD-10-CM

## 2016-10-30 DIAGNOSIS — K146 Glossodynia: Secondary | ICD-10-CM | POA: Diagnosis not present

## 2016-10-30 DIAGNOSIS — M545 Low back pain: Secondary | ICD-10-CM

## 2016-10-30 DIAGNOSIS — C9000 Multiple myeloma not having achieved remission: Secondary | ICD-10-CM

## 2016-10-30 DIAGNOSIS — G8929 Other chronic pain: Secondary | ICD-10-CM

## 2016-10-30 LAB — COMPREHENSIVE METABOLIC PANEL
ALBUMIN: 3.2 g/dL — AB (ref 3.5–5.0)
ALK PHOS: 68 U/L (ref 40–150)
ALT: 15 U/L (ref 0–55)
AST: 16 U/L (ref 5–34)
Anion Gap: 9 mEq/L (ref 3–11)
BUN: 18.5 mg/dL (ref 7.0–26.0)
CO2: 24 mEq/L (ref 22–29)
Calcium: 8.8 mg/dL (ref 8.4–10.4)
Chloride: 109 mEq/L (ref 98–109)
Creatinine: 1.4 mg/dL — ABNORMAL HIGH (ref 0.7–1.3)
EGFR: 51 mL/min/{1.73_m2} — ABNORMAL LOW (ref >90)
GLUCOSE: 122 mg/dL (ref 70–140)
POTASSIUM: 3.9 meq/L (ref 3.5–5.1)
SODIUM: 142 meq/L (ref 136–145)
TOTAL PROTEIN: 6.2 g/dL — AB (ref 6.4–8.3)
Total Bilirubin: 0.76 mg/dL (ref 0.20–1.20)

## 2016-10-30 MED ORDER — ZOLEDRONIC ACID 4 MG/5ML IV CONC
3.5000 mg | Freq: Once | INTRAVENOUS | Status: AC
  Administered 2016-10-30: 3.5 mg via INTRAVENOUS
  Filled 2016-10-30: qty 4.38

## 2016-10-30 MED ORDER — SODIUM CHLORIDE 0.9 % IJ SOLN
10.0000 mL | INTRAMUSCULAR | Status: DC | PRN
  Administered 2016-10-30: 10 mL via INTRAVENOUS
  Filled 2016-10-30: qty 10

## 2016-10-30 MED ORDER — SODIUM CHLORIDE 0.9 % IV SOLN
Freq: Once | INTRAVENOUS | Status: AC
  Administered 2016-10-30: 14:00:00 via INTRAVENOUS

## 2016-10-30 MED ORDER — HEPARIN SOD (PORK) LOCK FLUSH 100 UNIT/ML IV SOLN
500.0000 [IU] | Freq: Once | INTRAVENOUS | Status: AC | PRN
  Administered 2016-10-30: 500 [IU] via INTRAVENOUS
  Filled 2016-10-30: qty 5

## 2016-10-30 NOTE — Assessment & Plan Note (Signed)
He had recent elevated PSA and is currently undergoing evaluation by urologist. I will defer to them for further management.

## 2016-10-30 NOTE — Assessment & Plan Note (Signed)
He is currently on low-dose Revlimid as a form of maintenance treatment In the meantime, he will continue aspirin, calcium with vitamin D He denies recent dental issues. He will continue Zometa every 3 months I plan to reduce a dose of Zometa today due to mild acute renal failure

## 2016-10-30 NOTE — Assessment & Plan Note (Signed)
He has mild acute on chronic renal failure likely due to dehydration I reinforced importance of increase fluid intake as tolerated I would reduce the dose of Zometa because of this

## 2016-10-30 NOTE — Assessment & Plan Note (Signed)
He had kyphoplasty. He remained on Zometa. He is seeing [facility] for chronic pain management

## 2016-10-30 NOTE — Assessment & Plan Note (Signed)
Likely due to mild infection It is improving on conservative management with salt water gargle I recommend he continue same

## 2016-10-30 NOTE — Telephone Encounter (Signed)
Gave patient AVS and calender per 10/30/2016 los.

## 2016-10-30 NOTE — Patient Instructions (Signed)

## 2016-10-30 NOTE — Progress Notes (Signed)
[place] OFFICE PROGRESS NOTE  Patient Care Team: Jinny Sanders, MD as PCP - General Jeanann Lewandowsky, MD as Consulting Physician (Hematology and Oncology) Cathlean Marseilles, NP as Nurse Practitioner (Hematology and Oncology) Raynelle Bring, MD as Consulting Physician (Urology) Heath Lark, MD as Consulting Physician (Hematology and Oncology) [place], DPM as Consulting Physician (Podiatry) Kathaleen Bury, NP as Nurse Practitioner (Pain Medicine) Druscilla Brownie, MD as Consulting Physician (Dermatology) Bryson Ha, OD as Consulting Physician (Optometry)  SUMMARY OF ONCOLOGIC HISTORY:  Principle Diagnosis: Kappa light chain multiple myeloma, ISS III with diagnosis established in December 2012.  Prior Therapy:  1. Velcade, Cytoxan, and Decadron on a weekly basis from 07/10/2011 through 10/23/2011; 2. High-dose chemotherapy on 11/23/2011, consisting of carmustine 600 mg IV. He received cytarabine 400 mg IV on 12/24/2011, 12/25/2011, 12/26/2011, and 12/27/2011. He received etoposide 300 mg IV daily from 12/24/2011 through 12/27/2011, four doses. He received melphalan 280 mg IV on 12/28/2011.  3. 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  Current therapy: -Revlimid 5 mg daily, 3 weeks on, 1 week off. Revlimid was started on 05/11/2012.  Zometa was started on 09/02/2012.  Dental clearance was obtained.  Zometa 4 mg was placed on hold due to current osteomyelitis but restarted on 06/23/2013.    He also reports that his PSA is slowly rising and this may require future therapy by his urologist, Dr. Alinda Money.   INTERVAL HISTORY: Please see below for problem oriented charting. He returns today for further follow-up. He is being evaluated by urologist due to elevated PSA He continues to have chronic back pain, being seen at [facility] for chronic pain management.  Recent MRI shows evidence of degenerative joint disease He had recent soreness on the right tongue,  improving on conservative management  REVIEW OF SYSTEMS:   Constitutional: Denies fevers, chills or abnormal weight loss Eyes: Denies blurriness of vision Respiratory: Denies cough, dyspnea or wheezes Cardiovascular: Denies palpitation, chest discomfort or lower extremity swelling Gastrointestinal:  Denies nausea, heartburn or change in bowel habits Skin: Denies abnormal skin rashes Lymphatics: Denies new lymphadenopathy or easy bruising Neurological:Denies numbness, tingling or new weaknesses Behavioral/Psych: Mood is stable, no new changes  All other systems were reviewed with the patient and are negative.  I have reviewed the past medical history, past surgical history, social history and family history with the patient and they are unchanged from previous note.  ALLERGIES:  is allergic to celebrex [celecoxib]; cymbalta [duloxetine hcl]; hydromorphone hcl; meloxicam; oxycontin [oxycodone]; percocet [oxycodone-acetaminophen]; vicodin [hydrocodone-acetaminophen]; and lyrica [pregabalin].  MEDICATIONS:  Current Outpatient Prescriptions  Medication Sig Dispense Refill  . aspirin 81 MG tablet Take 81 mg by mouth daily.    . B Complex Vitamins (VITAMIN-B COMPLEX) TABS Take by mouth.    . Calcium Carbonate-Vitamin D 600-400 MG-UNIT per tablet Take by mouth.    . cholecalciferol (VITAMIN D) 1000 UNITS tablet Take 1,000 Units by mouth daily.    . fentaNYL (DURAGESIC - DOSED MCG/HR) 50 MCG/HR Place 1 patch (50 mcg total) onto the skin every 3 (three) days. 10 patch 0  . lenalidomide (REVLIMID) 5 MG capsule Take one capsule by mouth daily for 21 days, then off 7 days. 21 capsule 0  . lidocaine-prilocaine (EMLA) cream Apply topically as needed. Apply to port site one hour before treatment and cover with plastic wrap 30 g 2  . NONFORMULARY OR COMPOUNDED ITEM Baclofn/amitrp/ketamn cream topically to feet TID prn for neuropathy    .  oxyCODONE (OXY IR/ROXICODONE) 5 MG immediate release tablet TK 1 T  PO Q 4 H PRN P  0   No current facility-administered medications for this visit.     PHYSICAL EXAMINATION: ECOG PERFORMANCE STATUS: 1 - Symptomatic but completely ambulatory  Vitals:   10/30/16 1241  BP: 127/65  Pulse: 70  Resp: 18  Temp: 97.7 F (36.5 C)   Filed Weights   10/30/16 1241  Weight: 191 lb 12.8 oz (87 kg)    GENERAL:alert, no distress and comfortable SKIN: skin color, texture, turgor are normal, no rashes or significant lesions EYES: normal, Conjunctiva are pink and non-injected, sclera clear OROPHARYNX:noted tongue lesion due to canker sore NECK: supple, thyroid normal size, non-tender, without nodularity LYMPH:  no palpable lymphadenopathy in the cervical, axillary or inguinal LUNGS: clear to auscultation and percussion with normal breathing effort HEART: regular rate & rhythm and no murmurs and no lower extremity edema ABDOMEN:abdomen soft, non-tender and normal bowel sounds Musculoskeletal:no cyanosis of digits and no clubbing  NEURO: alert & oriented x 3 with fluent speech, no focal motor/sensory deficits  LABORATORY DATA:  I have reviewed the data as listed    Component Value Date/Time   NA 142 10/30/2016 1217   K 3.9 10/30/2016 1217   CL 105 04/12/2014 1041   CL 108 (H) 09/30/2013 0945   CL 110 (H) 12/29/2012 1151   CO2 24 10/30/2016 1217   GLUCOSE 122 10/30/2016 1217   GLUCOSE 127 (H) 12/29/2012 1151   BUN 18.5 10/30/2016 1217   CREATININE 1.4 (H) 10/30/2016 1217   CALCIUM 8.8 10/30/2016 1217   PROT 6.2 (L) 10/30/2016 1217   ALBUMIN 3.2 (L) 10/30/2016 1217   AST 16 10/30/2016 1217   ALT 15 10/30/2016 1217   ALKPHOS 68 10/30/2016 1217   BILITOT 0.76 10/30/2016 1217   GFRNONAA >60 09/30/2013 0945   GFRAA >60 09/30/2013 0945    No results found for: SPEP, UPEP  Lab Results  Component Value Date   WBC 4.9 07/24/2016   NEUTROABS 2.9 07/24/2016   HGB 14.7 07/24/2016   HCT 42.1 07/24/2016   MCV 98.8 (H) 07/24/2016   PLT 136 (L)  07/24/2016      Chemistry      Component Value Date/Time   NA 142 10/30/2016 1217   K 3.9 10/30/2016 1217   CL 105 04/12/2014 1041   CL 108 (H) 09/30/2013 0945   CL 110 (H) 12/29/2012 1151   CO2 24 10/30/2016 1217   BUN 18.5 10/30/2016 1217   CREATININE 1.4 (H) 10/30/2016 1217      Component Value Date/Time   CALCIUM 8.8 10/30/2016 1217   ALKPHOS 68 10/30/2016 1217   AST 16 10/30/2016 1217   ALT 15 10/30/2016 1217   BILITOT 0.76 10/30/2016 1217     ASSESSMENT & PLAN:  Multiple myeloma in remission ([place]) He is currently on low-dose Revlimid as a form of maintenance treatment In the meantime, he will continue aspirin, calcium with vitamin D He denies recent dental issues. He will continue Zometa every 3 months I plan to reduce a dose of Zometa today due to mild acute renal failure  Chronic kidney disease, stage III (moderate) He has mild acute on chronic renal failure likely due to dehydration I reinforced importance of increase fluid intake as tolerated I would reduce the dose of Zometa because of this   Tongue sore Likely due to mild infection It is improving on conservative management with salt water gargle I recommend he continue  same  Malignant neoplasm of prostate [facility]) He had recent elevated PSA and is currently undergoing evaluation by urologist. I will defer to them for further management.   Chronic back pain He had kyphoplasty. He remained on Zometa. He is seeing [facility] for chronic pain management   Orders Placed This Encounter  Procedures  . Comprehensive metabolic panel    Standing Status:   Future    Standing Expiration Date:   12/04/2017  . CBC with Differential/Platelet    Standing Status:   Future    Standing Expiration Date:   12/04/2017  . Kappa/lambda light chains    Standing Status:   Future    Standing Expiration Date:   12/04/2017  . Multiple Myeloma Panel (SPEP&IFE w/QIG)    Standing Status:   Future    Standing  Expiration Date:   12/04/2017   All questions were answered. The patient knows to call the clinic with any problems, questions or concerns. No barriers to learning was detected. I spent 15 minutes counseling the patient face to face. The total time spent in the appointment was 20 minutes and more than 50% was on counseling and review of test results     Heath Lark, MD 10/30/2016 5:34 PM

## 2016-11-05 ENCOUNTER — Ambulatory Visit (HOSPITAL_COMMUNITY)
Admission: RE | Admit: 2016-11-05 | Discharge: 2016-11-05 | Disposition: A | Discharge status: Home / Self Care | Payer: Medicare Other | Source: Ambulatory Visit | Attending: Urology | Admitting: Urology

## 2016-11-05 DIAGNOSIS — C61 Malignant neoplasm of prostate: Secondary | ICD-10-CM | POA: Diagnosis not present

## 2016-11-05 DIAGNOSIS — M5136 Other intervertebral disc degeneration, lumbar region: Secondary | ICD-10-CM | POA: Diagnosis not present

## 2016-11-05 MED ORDER — TECHNETIUM TC 99M MEDRONATE IV KIT
21.4000 | PACK | Freq: Once | INTRAVENOUS | Status: AC | PRN
  Administered 2016-11-05: 21.4 via INTRAVENOUS

## 2016-11-06 ENCOUNTER — Encounter: Payer: Self-pay | Admitting: *Deleted

## 2016-11-06 ENCOUNTER — Other Ambulatory Visit: Payer: Self-pay | Admitting: *Deleted

## 2016-11-06 MED ORDER — LENALIDOMIDE 5 MG PO CAPS: ORAL_CAPSULE | ORAL | 0 refills | Status: DC

## 2016-11-16 ENCOUNTER — Encounter: Payer: Self-pay | Admitting: *Deleted

## 2016-12-03 ENCOUNTER — Other Ambulatory Visit: Payer: Self-pay

## 2016-12-03 MED ORDER — LENALIDOMIDE 5 MG PO CAPS: ORAL_CAPSULE | ORAL | 0 refills | Status: DC

## 2016-12-14 ENCOUNTER — Ambulatory Visit (HOSPITAL_BASED_OUTPATIENT_CLINIC_OR_DEPARTMENT_OTHER): Payer: Medicare Other

## 2016-12-14 VITALS — BP 126/72 | HR 68 | Temp 97.6°F | Resp 18

## 2016-12-14 DIAGNOSIS — Z452 Encounter for adjustment and management of vascular access device: Secondary | ICD-10-CM | POA: Diagnosis present

## 2016-12-14 DIAGNOSIS — Z95828 Presence of other vascular implants and grafts: Secondary | ICD-10-CM

## 2016-12-14 DIAGNOSIS — C9001 Multiple myeloma in remission: Secondary | ICD-10-CM | POA: Diagnosis not present

## 2016-12-14 MED ORDER — HEPARIN SOD (PORK) LOCK FLUSH 100 UNIT/ML IV SOLN
500.0000 [IU] | Freq: Once | INTRAVENOUS | Status: AC | PRN
  Administered 2016-12-14: 500 [IU] via INTRAVENOUS
  Filled 2016-12-14: qty 5

## 2016-12-14 MED ORDER — SODIUM CHLORIDE 0.9 % IJ SOLN
10.0000 mL | INTRAMUSCULAR | Status: DC | PRN
  Administered 2016-12-14: 10 mL via INTRAVENOUS
  Filled 2016-12-14: qty 10

## 2016-12-14 NOTE — Patient Instructions (Signed)

## 2016-12-28 ENCOUNTER — Ambulatory Visit (INDEPENDENT_AMBULATORY_CARE_PROVIDER_SITE_OTHER): Payer: Medicare Other | Admitting: Podiatry

## 2016-12-28 ENCOUNTER — Encounter: Payer: Self-pay | Admitting: Podiatry

## 2016-12-28 DIAGNOSIS — Q828 Other specified congenital malformations of skin: Secondary | ICD-10-CM | POA: Diagnosis not present

## 2016-12-28 DIAGNOSIS — B351 Tinea unguium: Secondary | ICD-10-CM

## 2016-12-28 DIAGNOSIS — M79676 Pain in unspecified toe(s): Secondary | ICD-10-CM

## 2016-12-28 NOTE — Progress Notes (Signed)
He presents today with a chief complaint of painful elongated toenails and multiple calluses laterally. Multiple porokeratotic lesions plantar aspect of the bilateral foot.  Objective: Vital signs are stable alert and oriented 3. Pulses are palpable. Toenails are long thick yellow dystrophic with mycotic and painful palpation. Reactive hyperkeratosis distal aspect of the toes and plantar aspect of the forefoot.  Assessment: Pain limb saving her onychomycosis porokeratosis.  Plan: Debridement of all reactive hyperkeratosis and debridement of toenails 1 through 5 bilateral. Follow up with him in 2-3 months.

## 2017-01-04 ENCOUNTER — Other Ambulatory Visit: Payer: Self-pay

## 2017-01-04 MED ORDER — LENALIDOMIDE 5 MG PO CAPS: ORAL_CAPSULE | ORAL | 0 refills | Status: DC

## 2017-01-29 ENCOUNTER — Ambulatory Visit (HOSPITAL_BASED_OUTPATIENT_CLINIC_OR_DEPARTMENT_OTHER): Payer: Medicare Other

## 2017-01-29 ENCOUNTER — Other Ambulatory Visit: Payer: Self-pay | Admitting: *Deleted

## 2017-01-29 ENCOUNTER — Other Ambulatory Visit (HOSPITAL_BASED_OUTPATIENT_CLINIC_OR_DEPARTMENT_OTHER): Payer: Medicare Other

## 2017-01-29 DIAGNOSIS — C9001 Multiple myeloma in remission: Secondary | ICD-10-CM | POA: Diagnosis present

## 2017-01-29 DIAGNOSIS — Z452 Encounter for adjustment and management of vascular access device: Secondary | ICD-10-CM | POA: Diagnosis present

## 2017-01-29 DIAGNOSIS — Z95828 Presence of other vascular implants and grafts: Secondary | ICD-10-CM

## 2017-01-29 LAB — COMPREHENSIVE METABOLIC PANEL
ALT: 26 U/L (ref 0–55)
ANION GAP: 7 meq/L (ref 3–11)
AST: 25 U/L (ref 5–34)
Albumin: 3.2 g/dL — ABNORMAL LOW (ref 3.5–5.0)
Alkaline Phosphatase: 56 U/L (ref 40–150)
BUN: 15.2 mg/dL (ref 7.0–26.0)
CALCIUM: 8.7 mg/dL (ref 8.4–10.4)
CHLORIDE: 106 meq/L (ref 98–109)
CO2: 25 mEq/L (ref 22–29)
Creatinine: 1.4 mg/dL — ABNORMAL HIGH (ref 0.7–1.3)
EGFR: 52 mL/min/{1.73_m2} — AB (ref >90)
Glucose: 115 mg/dl (ref 70–140)
POTASSIUM: 3.8 meq/L (ref 3.5–5.1)
Sodium: 138 mEq/L (ref 136–145)
Total Bilirubin: 0.7 mg/dL (ref 0.20–1.20)
Total Protein: 5.9 g/dL — ABNORMAL LOW (ref 6.4–8.3)

## 2017-01-29 LAB — CBC WITH DIFFERENTIAL/PLATELET
BASO%: 1.2 % (ref 0.0–2.0)
Basophils Absolute: 0.1 10*3/uL (ref 0.0–0.1)
EOS ABS: 0.8 10*3/uL — AB (ref 0.0–0.5)
EOS%: 18.7 % — ABNORMAL HIGH (ref 0.0–7.0)
HEMATOCRIT: 37.1 % — AB (ref 38.4–49.9)
HGB: 12.8 g/dL — ABNORMAL LOW (ref 13.0–17.1)
LYMPH%: 26.4 % (ref 14.0–49.0)
MCH: 33.9 pg — ABNORMAL HIGH (ref 27.2–33.4)
MCHC: 34.5 g/dL (ref 32.0–36.0)
MCV: 98.1 fL — ABNORMAL HIGH (ref 79.3–98.0)
MONO#: 0.6 10*3/uL (ref 0.1–0.9)
MONO%: 14.9 % — AB (ref 0.0–14.0)
NEUT%: 38.8 % — ABNORMAL LOW (ref 39.0–75.0)
NEUTROS ABS: 1.6 10*3/uL (ref 1.5–6.5)
NRBC: 0 % (ref 0–0)
PLATELETS: 111 10*3/uL — AB (ref 140–400)
RBC: 3.78 10*6/uL — AB (ref 4.20–5.82)
RDW: 14.3 % (ref 11.0–14.6)
WBC: 4 10*3/uL (ref 4.0–10.3)
lymph#: 1.1 10*3/uL (ref 0.9–3.3)

## 2017-01-29 MED ORDER — SODIUM CHLORIDE 0.9 % IJ SOLN
10.0000 mL | INTRAMUSCULAR | Status: DC | PRN
  Administered 2017-01-29: 10 mL via INTRAVENOUS
  Filled 2017-01-29: qty 10

## 2017-01-29 NOTE — Patient Instructions (Signed)

## 2017-01-30 ENCOUNTER — Other Ambulatory Visit: Payer: Self-pay | Admitting: *Deleted

## 2017-01-30 MED ORDER — LENALIDOMIDE 5 MG PO CAPS: ORAL_CAPSULE | ORAL | 0 refills | Status: DC

## 2017-02-01 ENCOUNTER — Other Ambulatory Visit: Payer: Self-pay | Admitting: *Deleted

## 2017-02-01 LAB — KAPPA/LAMBDA LIGHT CHAINS
Ig Kappa Free Light Chain: 38.5 mg/L — ABNORMAL HIGH (ref 3.3–19.4)
Ig Lambda Free Light Chain: 35.7 mg/L — ABNORMAL HIGH (ref 5.7–26.3)
KAPPA/LAMBDA FLC RATIO: 1.08 (ref 0.26–1.65)

## 2017-02-01 MED ORDER — PANTOPRAZOLE SODIUM 40 MG PO TBEC: DELAYED_RELEASE_TABLET | ORAL | 3 refills | Status: DC

## 2017-02-02 LAB — MULTIPLE MYELOMA PANEL, SERUM
ALBUMIN/GLOB SERPL: 1.3 (ref 0.7–1.7)
ALPHA 1: 0.2 g/dL (ref 0.0–0.4)
ALPHA2 GLOB SERPL ELPH-MCNC: 0.7 g/dL (ref 0.4–1.0)
Albumin SerPl Elph-Mcnc: 3.2 g/dL (ref 2.9–4.4)
B-GLOBULIN SERPL ELPH-MCNC: 0.6 g/dL — AB (ref 0.7–1.3)
GAMMA GLOB SERPL ELPH-MCNC: 1 g/dL (ref 0.4–1.8)
GLOBULIN, TOTAL: 2.5 g/dL (ref 2.2–3.9)
IGM (IMMUNOGLOBIN M), SRM: 15 mg/dL (ref 15–143)
IgA, Qn, Serum: 214 mg/dL (ref 61–437)
Total Protein: 5.7 g/dL — ABNORMAL LOW (ref 6.0–8.5)

## 2017-02-05 ENCOUNTER — Ambulatory Visit (HOSPITAL_BASED_OUTPATIENT_CLINIC_OR_DEPARTMENT_OTHER): Payer: Medicare Other | Admitting: Hematology and Oncology

## 2017-02-05 ENCOUNTER — Ambulatory Visit (HOSPITAL_BASED_OUTPATIENT_CLINIC_OR_DEPARTMENT_OTHER): Payer: Medicare Other

## 2017-02-05 ENCOUNTER — Telehealth: Payer: Self-pay | Admitting: Hematology and Oncology

## 2017-02-05 VITALS — BP 129/70 | HR 61 | Temp 97.9°F | Resp 20 | Ht 73.0 in | Wt 195.1 lb

## 2017-02-05 DIAGNOSIS — N183 Chronic kidney disease, stage 3 unspecified: Secondary | ICD-10-CM

## 2017-02-05 DIAGNOSIS — C9000 Multiple myeloma not having achieved remission: Secondary | ICD-10-CM

## 2017-02-05 DIAGNOSIS — C61 Malignant neoplasm of prostate: Secondary | ICD-10-CM

## 2017-02-05 DIAGNOSIS — D61818 Other pancytopenia: Secondary | ICD-10-CM

## 2017-02-05 DIAGNOSIS — C9001 Multiple myeloma in remission: Secondary | ICD-10-CM | POA: Diagnosis present

## 2017-02-05 MED ORDER — HEPARIN SOD (PORK) LOCK FLUSH 100 UNIT/ML IV SOLN
250.0000 [IU] | Freq: Once | INTRAVENOUS | Status: AC | PRN
  Administered 2017-02-05: 250 [IU]
  Filled 2017-02-05: qty 5

## 2017-02-05 MED ORDER — ZOLEDRONIC ACID 4 MG/5ML IV CONC
3.5000 mg | Freq: Once | INTRAVENOUS | Status: AC
  Administered 2017-02-05: 3.5 mg via INTRAVENOUS
  Filled 2017-02-05: qty 4.38

## 2017-02-05 MED ORDER — SODIUM CHLORIDE 0.9 % IJ SOLN
3.0000 mL | Freq: Once | INTRAMUSCULAR | Status: AC | PRN
  Administered 2017-02-05: 3 mL via INTRAVENOUS
  Filled 2017-02-05: qty 10

## 2017-02-05 NOTE — Progress Notes (Signed)
Per Dr. Alvy Bimler, okay to treat today with Crt of 1.4

## 2017-02-05 NOTE — Patient Instructions (Signed)

## 2017-02-05 NOTE — Telephone Encounter (Signed)
Gv pt appts for 8/24 + 05/14/17 per 7/13 los.

## 2017-02-06 ENCOUNTER — Encounter: Payer: Self-pay | Admitting: Hematology and Oncology

## 2017-02-06 NOTE — Assessment & Plan Note (Signed)
He is currently on low-dose Revlimid as a form of maintenance treatment In the meantime, he will continue aspirin, calcium with vitamin D He denies recent dental issues. He will continue Zometa every 3 months I plan to reduce a dose of Zometa today due to mild renal failure

## 2017-02-06 NOTE — Assessment & Plan Note (Signed)
He is following up with urologist He is currently on hormonal manipulation I would defer to them for further management.

## 2017-02-06 NOTE — Progress Notes (Signed)
[name] OFFICE PROGRESS NOTE  Patient Care Team: Jinny Sanders, MD as PCP - General Jeanann Lewandowsky, MD as Consulting Physician (Hematology and Oncology) Cathlean Marseilles, NP as Nurse Practitioner (Hematology and Oncology) Raynelle Bring, MD as Consulting Physician (Urology) Heath Lark, MD as Consulting Physician (Hematology and Oncology) Garrel Ridgel, [place] as Consulting Physician (Podiatry) Morton Stall, Howell Rucks, NP as Nurse Practitioner (Pain Medicine) Druscilla Brownie, MD as Consulting Physician (Dermatology) Bryson Ha, OD as Consulting Physician (Optometry)  SUMMARY OF ONCOLOGIC HISTORY:  SUMMARY OF ONCOLOGIC HISTORY:  Principle Diagnosis: Kappa light chain multiple myeloma, ISS III with diagnosis established in December 2012.  Prior Therapy:  1. Velcade, Cytoxan, and Decadron on a weekly basis from 07/10/2011 through 10/23/2011; 2. High-dose chemotherapy on 11/23/2011, consisting of carmustine 600 mg IV. He received cytarabine 400 mg IV on 12/24/2011, 12/25/2011, 12/26/2011, and 12/27/2011. He received etoposide 300 mg IV daily from 12/24/2011 through 12/27/2011, four doses. He received melphalan 280 mg IV on 12/28/2011.  3. 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  Current therapy: -Revlimid 5 mg daily, 3 weeks on, 1 week off. Revlimid was started on 05/11/2012.  Zometa was started on 09/02/2012.  Dental clearance was obtained.  Zometa 4 mg was placed on hold due to current osteomyelitis but restarted on 06/23/2013.    He also reports that his PSA is slowly rising and this may require future therapy by his urologist, Dr. Alinda Money.   INTERVAL HISTORY: Please see below for problem oriented charting. He returns today for further follow-up. He continues to have chronic back pain, being seen at [facility] for chronic pain management.  Recent MRI shows evidence of degenerative joint disease He was recently started on hormonal injections for prostate  cancer with good success He denies recent infection  He has no recent dental issues  REVIEW OF SYSTEMS:   Constitutional: Denies fevers, chills or abnormal weight loss Eyes: Denies blurriness of vision Ears, nose, mouth, throat, and face: Denies mucositis or sore throat Respiratory: Denies cough, dyspnea or wheezes Cardiovascular: Denies palpitation, chest discomfort or lower extremity swelling Gastrointestinal:  Denies nausea, heartburn or change in bowel habits Skin: Denies abnormal skin rashes Lymphatics: Denies new lymphadenopathy or easy bruising Neurological:Denies numbness, tingling or new weaknesses Behavioral/Psych: Mood is stable, no new changes  All other systems were reviewed with the patient and are negative.  I have reviewed the past medical history, past surgical history, social history and family history with the patient and they are unchanged from previous note.  ALLERGIES:  is allergic to celebrex [celecoxib]; cymbalta [duloxetine hcl]; hydromorphone hcl; meloxicam; oxycontin [oxycodone]; percocet [oxycodone-acetaminophen]; vicodin [hydrocodone-acetaminophen]; and lyrica [pregabalin].  MEDICATIONS:  Current Outpatient Prescriptions  Medication Sig Dispense Refill  . Lifitegrast 5 % SOLN Apply 2 drops to eye 2 (two) times daily.    Marland Kitchen aspirin 81 MG tablet Take 81 mg by mouth daily.    . B Complex Vitamins (VITAMIN-B COMPLEX) TABS Take by mouth.    . Calcium Carbonate-Vitamin D 600-400 MG-UNIT per tablet Take by mouth.    . cholecalciferol (VITAMIN D) 1000 UNITS tablet Take 1,000 Units by mouth daily.    . fentaNYL (DURAGESIC - DOSED MCG/HR) 50 MCG/HR Place 1 patch (50 mcg total) onto the skin every 3 (three) days. 10 patch 0  . lenalidomide (REVLIMID) 5 MG capsule Take one capsule by mouth daily for 21 days, then off 7 days. 21 capsule 0  . lidocaine-prilocaine (EMLA) cream Apply topically as needed.  Apply to port site one hour before treatment and cover with plastic  wrap 30 g 2  . NONFORMULARY OR COMPOUNDED ITEM Baclofn/amitrp/ketamn cream topically to feet TID prn for neuropathy    . oxyCODONE (OXY IR/ROXICODONE) 5 MG immediate release tablet TK 1 T PO Q 4 H PRN P  0  . pantoprazole (PROTONIX) 40 MG tablet TAKE 1 TABLET (40 MG TOTAL) BY MOUTH DAILY. 90 tablet 3   No current facility-administered medications for this visit.     PHYSICAL EXAMINATION: ECOG PERFORMANCE STATUS: 1 - Symptomatic but completely ambulatory  Vitals:   02/05/17 1237  BP: 129/70  Pulse: 61  Resp: 20  Temp: 97.9 F (36.6 C)   Filed Weights   02/05/17 1237  Weight: 195 lb 1.6 oz (88.5 kg)    GENERAL:alert, no distress and comfortable SKIN: skin color, texture, turgor are normal, no rashes or significant lesions EYES: normal, Conjunctiva are pink and non-injected, sclera clear OROPHARYNX:no exudate, no erythema and lips, buccal mucosa, and tongue normal.  NECK: supple, thyroid normal size, non-tender, without nodularity LYMPH:  no palpable lymphadenopathy in the cervical, axillary or inguinal LUNGS: clear to auscultation and percussion with normal breathing effort HEART: regular rate & rhythm and no murmurs and no lower extremity edema ABDOMEN:abdomen soft, non-tender and normal bowel sounds Musculoskeletal:no cyanosis of digits and no clubbing  NEURO: alert & oriented x 3 with fluent speech, no focal motor/sensory deficits  LABORATORY DATA:  I have reviewed the data as listed    Component Value Date/Time   NA 138 01/29/2017 1219   K 3.8 01/29/2017 1219   CL 105 04/12/2014 1041   CL 108 (H) 09/30/2013 0945   CL 110 (H) 12/29/2012 1151   CO2 25 01/29/2017 1219   GLUCOSE 115 01/29/2017 1219   GLUCOSE 127 (H) 12/29/2012 1151   BUN 15.2 01/29/2017 1219   CREATININE 1.4 (H) 01/29/2017 1219   CALCIUM 8.7 01/29/2017 1219   PROT 5.7 (L) 01/29/2017 1220   PROT 5.9 (L) 01/29/2017 1219   ALBUMIN 3.2 (L) 01/29/2017 1219   AST 25 01/29/2017 1219   ALT 26 01/29/2017  1219   ALKPHOS 56 01/29/2017 1219   BILITOT 0.70 01/29/2017 1219   GFRNONAA >60 09/30/2013 0945   GFRAA >60 09/30/2013 0945    No results found for: SPEP, UPEP  Lab Results  Component Value Date   WBC 4.0 01/29/2017   NEUTROABS 1.6 01/29/2017   HGB 12.8 (L) 01/29/2017   HCT 37.1 (L) 01/29/2017   MCV 98.1 (H) 01/29/2017   PLT 111 (L) 01/29/2017      Chemistry      Component Value Date/Time   NA 138 01/29/2017 1219   K 3.8 01/29/2017 1219   CL 105 04/12/2014 1041   CL 108 (H) 09/30/2013 0945   CL 110 (H) 12/29/2012 1151   CO2 25 01/29/2017 1219   BUN 15.2 01/29/2017 1219   CREATININE 1.4 (H) 01/29/2017 1219      Component Value Date/Time   CALCIUM 8.7 01/29/2017 1219   ALKPHOS 56 01/29/2017 1219   AST 25 01/29/2017 1219   ALT 26 01/29/2017 1219   BILITOT 0.70 01/29/2017 1219     ASSESSMENT & PLAN:  Multiple myeloma in remission ([place]) He is currently on low-dose Revlimid as a form of maintenance treatment In the meantime, he will continue aspirin, calcium with vitamin D He denies recent dental issues. He will continue Zometa every 3 months I plan to reduce a dose of Zometa  today due to mild renal failure  Malignant neoplasm of prostate [facility]) He is following up with urologist He is currently on hormonal manipulation I would defer to them for further management.  Chronic kidney disease, stage III (moderate) He has mild chronic renal failure  I reinforced importance of increase fluid intake as tolerated I would reduce the dose of Zometa because of this   Acquired pancytopenia ([place]) This is likely due to recent treatment. The patient denies recent history of bleeding such as epistaxis, hematuria or hematochezia. He is asymptomatic from the low platelet count. I will observe for now.   We will continue low-dose Revlimid as prescribed.   Orders Placed This Encounter  Procedures  . Comprehensive metabolic panel    Standing Status:   Future    Standing  Expiration Date:   03/12/2018  . CBC with Differential/Platelet    Standing Status:   Future    Standing Expiration Date:   03/12/2018  . Kappa/lambda light chains    Standing Status:   Future    Standing Expiration Date:   03/12/2018  . Multiple Myeloma Panel (SPEP&IFE w/QIG)    Standing Status:   Future    Standing Expiration Date:   03/12/2018   All questions were answered. The patient knows to call the clinic with any problems, questions or concerns. No barriers to learning was detected. I spent 15 minutes counseling the patient face to face. The total time spent in the appointment was 20 minutes and more than 50% was on counseling and review of test results     Heath Lark, MD 02/06/2017 10:25 AM

## 2017-02-06 NOTE — Assessment & Plan Note (Signed)
This is likely due to recent treatment. The patient denies recent history of bleeding such as epistaxis, hematuria or hematochezia. He is asymptomatic from the low platelet count. I will observe for now.   We will continue low-dose Revlimid as prescribed.

## 2017-02-06 NOTE — Assessment & Plan Note (Signed)
He has mild chronic renal failure  I reinforced importance of increase fluid intake as tolerated I would reduce the dose of Zometa because of this

## 2017-02-26 ENCOUNTER — Other Ambulatory Visit: Payer: Self-pay | Admitting: *Deleted

## 2017-02-26 MED ORDER — LENALIDOMIDE 5 MG PO CAPS: ORAL_CAPSULE | ORAL | 0 refills | Status: DC

## 2017-03-12 ENCOUNTER — Encounter: Payer: Self-pay | Admitting: Gastroenterology

## 2017-03-19 ENCOUNTER — Ambulatory Visit (HOSPITAL_BASED_OUTPATIENT_CLINIC_OR_DEPARTMENT_OTHER): Payer: Medicare Other

## 2017-03-19 VITALS — BP 127/65 | HR 65 | Temp 97.7°F | Resp 18

## 2017-03-19 DIAGNOSIS — Z452 Encounter for adjustment and management of vascular access device: Secondary | ICD-10-CM

## 2017-03-19 DIAGNOSIS — C9001 Multiple myeloma in remission: Secondary | ICD-10-CM

## 2017-03-19 DIAGNOSIS — Z95828 Presence of other vascular implants and grafts: Secondary | ICD-10-CM

## 2017-03-19 MED ORDER — HEPARIN SOD (PORK) LOCK FLUSH 100 UNIT/ML IV SOLN
500.0000 [IU] | Freq: Once | INTRAVENOUS | Status: AC | PRN
  Administered 2017-03-19: 500 [IU] via INTRAVENOUS
  Filled 2017-03-19: qty 5

## 2017-03-19 MED ORDER — SODIUM CHLORIDE 0.9 % IJ SOLN
10.0000 mL | INTRAMUSCULAR | Status: DC | PRN
  Administered 2017-03-19: 10 mL via INTRAVENOUS
  Filled 2017-03-19: qty 10

## 2017-03-19 NOTE — Patient Instructions (Signed)

## 2017-03-25 ENCOUNTER — Encounter: Payer: Self-pay | Admitting: *Deleted

## 2017-03-25 ENCOUNTER — Other Ambulatory Visit: Payer: Self-pay | Admitting: *Deleted

## 2017-03-25 MED ORDER — LENALIDOMIDE 5 MG PO CAPS: ORAL_CAPSULE | ORAL | 0 refills | Status: DC

## 2017-03-31 ENCOUNTER — Ambulatory Visit: Payer: Medicare Other | Admitting: Podiatry

## 2017-04-07 ENCOUNTER — Encounter: Payer: Self-pay | Admitting: Podiatry

## 2017-04-07 ENCOUNTER — Ambulatory Visit (INDEPENDENT_AMBULATORY_CARE_PROVIDER_SITE_OTHER): Payer: Medicare Other | Admitting: Podiatry

## 2017-04-07 DIAGNOSIS — B351 Tinea unguium: Secondary | ICD-10-CM

## 2017-04-07 DIAGNOSIS — Q828 Other specified congenital malformations of skin: Secondary | ICD-10-CM

## 2017-04-07 DIAGNOSIS — M79676 Pain in unspecified toe(s): Secondary | ICD-10-CM

## 2017-04-07 NOTE — Progress Notes (Signed)
He presents today chief complaint of painful elongated toenails and concerned about a lesion to the dorsal medial aspect of the hallux interphalangeal joint right foot. He states that his multiple myeloma is in remission but he is still being treated for his prostate cancer.  Objective: Vital signs are stable alert and oriented 3 pulses are palpable area hammertoe deformities mallet toe deformities bilateral nails are long thick yellow dystrophic onychomycotic painful palpation as well as debridement. He also has what appears to be a mucoid cyst to the medial aspect of the hallux interphalangeal joint right foot.  Assessment: Pain limb secondary to onychomycosis.  Plan: Debridement of toenails 1 through 5 bilateral. Follow up with him in a few months

## 2017-04-22 ENCOUNTER — Other Ambulatory Visit: Payer: Self-pay | Admitting: *Deleted

## 2017-04-22 MED ORDER — LENALIDOMIDE 5 MG PO CAPS
ORAL_CAPSULE | ORAL | 0 refills | Status: DC
Start: 2017-04-22 — End: 2017-05-21

## 2017-05-14 ENCOUNTER — Other Ambulatory Visit (HOSPITAL_BASED_OUTPATIENT_CLINIC_OR_DEPARTMENT_OTHER): Payer: Medicare Other

## 2017-05-14 ENCOUNTER — Ambulatory Visit (HOSPITAL_BASED_OUTPATIENT_CLINIC_OR_DEPARTMENT_OTHER): Payer: Medicare Other | Admitting: Hematology and Oncology

## 2017-05-14 ENCOUNTER — Ambulatory Visit: Payer: Medicare Other

## 2017-05-14 ENCOUNTER — Ambulatory Visit (AMBULATORY_SURGERY_CENTER): Payer: Self-pay | Admitting: *Deleted

## 2017-05-14 ENCOUNTER — Telehealth: Payer: Self-pay

## 2017-05-14 ENCOUNTER — Encounter: Payer: Self-pay | Admitting: Hematology and Oncology

## 2017-05-14 ENCOUNTER — Ambulatory Visit (HOSPITAL_BASED_OUTPATIENT_CLINIC_OR_DEPARTMENT_OTHER): Payer: Medicare Other

## 2017-05-14 VITALS — Ht 73.0 in | Wt 191.8 lb

## 2017-05-14 VITALS — BP 145/87 | HR 60 | Temp 97.8°F | Resp 18 | Ht 73.0 in | Wt 192.4 lb

## 2017-05-14 DIAGNOSIS — D61818 Other pancytopenia: Secondary | ICD-10-CM | POA: Diagnosis not present

## 2017-05-14 DIAGNOSIS — C9001 Multiple myeloma in remission: Secondary | ICD-10-CM | POA: Diagnosis present

## 2017-05-14 DIAGNOSIS — Z23 Encounter for immunization: Secondary | ICD-10-CM | POA: Diagnosis not present

## 2017-05-14 DIAGNOSIS — C9000 Multiple myeloma not having achieved remission: Secondary | ICD-10-CM

## 2017-05-14 DIAGNOSIS — Z95828 Presence of other vascular implants and grafts: Secondary | ICD-10-CM

## 2017-05-14 DIAGNOSIS — C61 Malignant neoplasm of prostate: Secondary | ICD-10-CM

## 2017-05-14 DIAGNOSIS — R232 Flushing: Secondary | ICD-10-CM | POA: Diagnosis not present

## 2017-05-14 DIAGNOSIS — Z299 Encounter for prophylactic measures, unspecified: Secondary | ICD-10-CM | POA: Diagnosis not present

## 2017-05-14 DIAGNOSIS — N951 Menopausal and female climacteric states: Secondary | ICD-10-CM | POA: Insufficient documentation

## 2017-05-14 DIAGNOSIS — Z1211 Encounter for screening for malignant neoplasm of colon: Secondary | ICD-10-CM

## 2017-05-14 LAB — CBC WITH DIFFERENTIAL/PLATELET
BASO%: 1.4 % (ref 0.0–2.0)
Basophils Absolute: 0.1 10*3/uL (ref 0.0–0.1)
EOS ABS: 0.4 10*3/uL (ref 0.0–0.5)
EOS%: 6.3 % (ref 0.0–7.0)
HEMATOCRIT: 39.2 % (ref 38.4–49.9)
HEMOGLOBIN: 13.2 g/dL (ref 13.0–17.1)
LYMPH%: 15.3 % (ref 14.0–49.0)
MCH: 34.9 pg — ABNORMAL HIGH (ref 27.2–33.4)
MCHC: 33.6 g/dL (ref 32.0–36.0)
MCV: 103.9 fL — AB (ref 79.3–98.0)
MONO#: 0.9 10*3/uL (ref 0.1–0.9)
MONO%: 15.9 % — ABNORMAL HIGH (ref 0.0–14.0)
NEUT%: 61.1 % (ref 39.0–75.0)
NEUTROS ABS: 3.6 10*3/uL (ref 1.5–6.5)
PLATELETS: 120 10*3/uL — AB (ref 140–400)
RBC: 3.78 10*6/uL — ABNORMAL LOW (ref 4.20–5.82)
RDW: 14.5 % (ref 11.0–14.6)
WBC: 5.9 10*3/uL (ref 4.0–10.3)
lymph#: 0.9 10*3/uL (ref 0.9–3.3)

## 2017-05-14 LAB — COMPREHENSIVE METABOLIC PANEL
ALT: 18 U/L (ref 0–55)
AST: 19 U/L (ref 5–34)
Albumin: 3.3 g/dL — ABNORMAL LOW (ref 3.5–5.0)
Alkaline Phosphatase: 45 U/L (ref 40–150)
Anion Gap: 8 mEq/L (ref 3–11)
BUN: 17.9 mg/dL (ref 7.0–26.0)
CALCIUM: 9.3 mg/dL (ref 8.4–10.4)
CHLORIDE: 104 meq/L (ref 98–109)
CO2: 27 mEq/L (ref 22–29)
CREATININE: 1.3 mg/dL (ref 0.7–1.3)
EGFR: 56 mL/min/{1.73_m2} — ABNORMAL LOW (ref >60)
GLUCOSE: 109 mg/dL (ref 70–140)
POTASSIUM: 3.8 meq/L (ref 3.5–5.1)
SODIUM: 139 meq/L (ref 136–145)
Total Bilirubin: 0.67 mg/dL (ref 0.20–1.20)
Total Protein: 6.6 g/dL (ref 6.4–8.3)

## 2017-05-14 MED ORDER — NA SULFATE-K SULFATE-MG SULF 17.5-3.13-1.6 GM/177ML PO SOLN: ORAL | 0 refills | Status: DC

## 2017-05-14 MED ORDER — SODIUM CHLORIDE 0.9 % IJ SOLN
10.0000 mL | INTRAMUSCULAR | Status: DC | PRN
  Administered 2017-05-14: 10 mL via INTRAVENOUS
  Filled 2017-05-14: qty 10

## 2017-05-14 MED ORDER — ZOLEDRONIC ACID 4 MG/5ML IV CONC
3.5000 mg | Freq: Once | INTRAVENOUS | Status: AC
  Administered 2017-05-14: 3.5 mg via INTRAVENOUS
  Filled 2017-05-14: qty 4.38

## 2017-05-14 MED ORDER — INFLUENZA VAC SPLIT HIGH-DOSE 0.5 ML IM SUSY
0.5000 mL | PREFILLED_SYRINGE | Freq: Once | INTRAMUSCULAR | Status: AC
  Administered 2017-05-14: 0.5 mL via INTRAMUSCULAR
  Filled 2017-05-14: qty 0.5

## 2017-05-14 MED ORDER — HEPARIN SOD (PORK) LOCK FLUSH 100 UNIT/ML IV SOLN
500.0000 [IU] | Freq: Once | INTRAVENOUS | Status: AC | PRN
  Administered 2017-05-14: 500 [IU] via INTRAVENOUS
  Filled 2017-05-14: qty 5

## 2017-05-14 NOTE — Telephone Encounter (Signed)
Scheduled patient upcoming appointment, and patient will receive a print out after his infusion. Per 10/19 los

## 2017-05-14 NOTE — Assessment & Plan Note (Signed)
He has menopausal hot flashes due to hormonal manipulation He was started on Effexor but complicated by diarrhea I recommend he contact his urologist for further management

## 2017-05-14 NOTE — Assessment & Plan Note (Signed)
He is following up with urologist He is currently on hormonal manipulation I would defer to them for further management.

## 2017-05-14 NOTE — Assessment & Plan Note (Signed)
We discussed the importance of preventive care and reviewed the vaccination programs. He does not have any prior allergic reactions to influenza vaccination. He agrees to proceed with influenza vaccination today and we will administer it today at the clinic.  

## 2017-05-14 NOTE — Progress Notes (Signed)
[place] OFFICE PROGRESS NOTE  Patient Care Team: Jinny Sanders, MD as PCP - General Jeanann Lewandowsky, MD as Consulting Physician (Hematology and Oncology) Cathlean Marseilles, NP as Nurse Practitioner (Hematology and Oncology) Raynelle Bring, MD as Consulting Physician (Urology) Heath Lark, MD as Consulting Physician (Hematology and Oncology) Garrel Ridgel, [place] as Consulting Physician (Podiatry) Morton Stall, Howell Rucks, NP as Nurse Practitioner (Pain Medicine) Druscilla Brownie, MD as Consulting Physician (Dermatology) Bryson Ha, OD as Consulting Physician (Optometry)  SUMMARY OF ONCOLOGIC HISTORY:  Principle Diagnosis: Kappa light chain multiple myeloma, ISS III with diagnosis established in December 2012.  Prior Therapy:  1. Velcade, Cytoxan, and Decadron on a weekly basis from 07/10/2011 through 10/23/2011; 2. High-dose chemotherapy on 11/23/2011, consisting of carmustine 600 mg IV. He received cytarabine 400 mg IV on 12/24/2011, 12/25/2011, 12/26/2011, and 12/27/2011. He received etoposide 300 mg IV daily from 12/24/2011 through 12/27/2011, four doses. He received melphalan 280 mg IV on 12/28/2011.  3. 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  Current therapy: -Revlimid 5 mg daily, 3 weeks on, 1 week off. Revlimid was started on 05/11/2012.  Zometa was started on 09/02/2012.  Dental clearance was obtained.  Zometa 4 mg was placed on hold due to current osteomyelitis but restarted on 06/23/2013.    He also reports that his PSA is slowly rising and this may require future therapy by his urologist, Dr. Alinda Money.   INTERVAL HISTORY: Please see below for problem oriented charting. He returns today for further follow-up. He continues to have chronic back pain, being seen at [facility] for chronic pain management.  Recent MRI shows evidence of degenerative joint disease He was recently started on hormonal injections for prostate cancer with good success except  for hot flashes He was prescribed Effexor but that causes diarrhea He denies recent infection  He has no recent dental issues The patient denies any recent signs or symptoms of bleeding such as spontaneous epistaxis, hematuria or hematochezia.  REVIEW OF SYSTEMS:   Constitutional: Denies fevers, chills or abnormal weight loss Eyes: Denies blurriness of vision Ears, nose, mouth, throat, and face: Denies mucositis or sore throat Respiratory: Denies cough, dyspnea or wheezes Cardiovascular: Denies palpitation, chest discomfort or lower extremity swelling Gastrointestinal:  Denies nausea, heartburn or change in bowel habits Skin: Denies abnormal skin rashes Lymphatics: Denies new lymphadenopathy or easy bruising Neurological:Denies numbness, tingling or new weaknesses Behavioral/Psych: Mood is stable, no new changes  All other systems were reviewed with the patient and are negative.  I have reviewed the past medical history, past surgical history, social history and family history with the patient and they are unchanged from previous note.  ALLERGIES:  is allergic to celebrex [celecoxib]; cymbalta [duloxetine hcl]; hydromorphone hcl; meloxicam; oxycontin [oxycodone]; percocet [oxycodone-acetaminophen]; vicodin [hydrocodone-acetaminophen]; and lyrica [pregabalin].  MEDICATIONS:  Current Outpatient Prescriptions  Medication Sig Dispense Refill  . aspirin 81 MG tablet Take 81 mg by mouth daily.    . B Complex Vitamins (VITAMIN-B COMPLEX) TABS Take by mouth.    . Calcium Carbonate-Vitamin D 600-400 MG-UNIT per tablet Take by mouth.    . cholecalciferol (VITAMIN D) 1000 UNITS tablet Take 1,000 Units by mouth daily.    . fentaNYL (DURAGESIC - DOSED MCG/HR) 50 MCG/HR Place 1 patch (50 mcg total) onto the skin every 3 (three) days. 10 patch 0  . lenalidomide (REVLIMID) 5 MG capsule Take one capsule by mouth daily for 21 days, then off 7 days. 21 capsule 0  .  lidocaine-prilocaine (EMLA) cream  Apply topically as needed. Apply to port site one hour before treatment and cover with plastic wrap 30 g 2  . Na Sulfate-K Sulfate-Mg Sulf (SUPREP BOWEL PREP KIT) 17.5-3.13-1.6 GM/177ML SOLN suprep as directed.  No substitutions 354 mL 0  . NONFORMULARY OR COMPOUNDED ITEM Baclofn/amitrp/ketamn cream topically to feet TID prn for neuropathy    . oxyCODONE (OXY IR/ROXICODONE) 5 MG immediate release tablet TK 1 T PO Q 4 H PRN P  0   No current facility-administered medications for this visit.     PHYSICAL EXAMINATION: ECOG PERFORMANCE STATUS: 1 - Symptomatic but completely ambulatory  Vitals:   05/14/17 1222  BP: (!) 145/87  Pulse: 60  Resp: 18  Temp: 97.8 F (36.6 C)  SpO2: (!) 10%   Filed Weights   05/14/17 1222  Weight: 192 lb 6.4 oz (87.3 kg)    GENERAL:alert, no distress and comfortable SKIN: skin color, texture, turgor are normal, no rashes or significant lesions EYES: normal, Conjunctiva are pink and non-injected, sclera clear OROPHARYNX:no exudate, no erythema and lips, buccal mucosa, and tongue normal  NECK: supple, thyroid normal size, non-tender, without nodularity LYMPH:  no palpable lymphadenopathy in the cervical, axillary or inguinal LUNGS: clear to auscultation and percussion with normal breathing effort HEART: regular rate & rhythm and no murmurs and no lower extremity edema ABDOMEN:abdomen soft, non-tender and normal bowel sounds Musculoskeletal:no cyanosis of digits and no clubbing  NEURO: alert & oriented x 3 with fluent speech, no focal motor/sensory deficits  LABORATORY DATA:  I have reviewed the data as listed    Component Value Date/Time   NA 139 05/14/2017 1145   K 3.8 05/14/2017 1145   CL 105 04/12/2014 1041   CL 108 (H) 09/30/2013 0945   CL 110 (H) 12/29/2012 1151   CO2 27 05/14/2017 1145   GLUCOSE 109 05/14/2017 1145   GLUCOSE 127 (H) 12/29/2012 1151   BUN 17.9 05/14/2017 1145   CREATININE 1.3 05/14/2017 1145   CALCIUM 9.3 05/14/2017 1145    PROT 6.6 05/14/2017 1145   ALBUMIN 3.3 (L) 05/14/2017 1145   AST 19 05/14/2017 1145   ALT 18 05/14/2017 1145   ALKPHOS 45 05/14/2017 1145   BILITOT 0.67 05/14/2017 1145   GFRNONAA >60 09/30/2013 0945   GFRAA >60 09/30/2013 0945    No results found for: SPEP, UPEP  Lab Results  Component Value Date   WBC 5.9 05/14/2017   NEUTROABS 3.6 05/14/2017   HGB 13.2 05/14/2017   HCT 39.2 05/14/2017   MCV 103.9 (H) 05/14/2017   PLT 120 (L) 05/14/2017      Chemistry      Component Value Date/Time   NA 139 05/14/2017 1145   K 3.8 05/14/2017 1145   CL 105 04/12/2014 1041   CL 108 (H) 09/30/2013 0945   CL 110 (H) 12/29/2012 1151   CO2 27 05/14/2017 1145   BUN 17.9 05/14/2017 1145   CREATININE 1.3 05/14/2017 1145      Component Value Date/Time   CALCIUM 9.3 05/14/2017 1145   ALKPHOS 45 05/14/2017 1145   AST 19 05/14/2017 1145   ALT 18 05/14/2017 1145   BILITOT 0.67 05/14/2017 1145     ASSESSMENT & PLAN:  Multiple myeloma in remission ([place]) He is currently on low-dose Revlimid as a form of maintenance treatment In the meantime, he will continue aspirin, calcium with vitamin D He denies recent dental issues. He will continue Zometa every 3 months I plan to reduce dose  of Zometa today due to mild renal failure  Preventive measure We discussed the importance of preventive care and reviewed the vaccination programs. He does not have any prior allergic reactions to influenza vaccination. He agrees to proceed with influenza vaccination today and we will administer it today at the clinic.   Acquired pancytopenia [facility]) This is likely due to recent treatment. The patient denies recent history of bleeding such as epistaxis, hematuria or hematochezia. He is asymptomatic from the low platelet count. I will observe for now.   We will continue low-dose Revlimid as prescribed.  Malignant neoplasm of prostate [facility]) He is following up with urologist He is currently on hormonal  manipulation I would defer to them for further management.  Menopausal syndrome (hot flashes) He has menopausal hot flashes due to hormonal manipulation He was started on Effexor but complicated by diarrhea I recommend he contact his urologist for further management   Orders Placed This Encounter  Procedures  . CBC with Differential/Platelet    Standing Status:   Future    Standing Expiration Date:   06/18/2018  . Comprehensive metabolic panel    Standing Status:   Future    Standing Expiration Date:   06/18/2018  . Kappa/lambda light chains    Standing Status:   Future    Standing Expiration Date:   06/18/2018  . Multiple Myeloma Panel (SPEP&IFE w/QIG)    Standing Status:   Future    Standing Expiration Date:   06/18/2018   All questions were answered. The patient knows to call the clinic with any problems, questions or concerns. No barriers to learning was detected. I spent 15 minutes counseling the patient face to face. The total time spent in the appointment was 20 minutes and more than 50% was on counseling and review of test results     Heath Lark, MD 05/14/2017 12:43 PM

## 2017-05-14 NOTE — Assessment & Plan Note (Signed)
This is likely due to recent treatment. The patient denies recent history of bleeding such as epistaxis, hematuria or hematochezia. He is asymptomatic from the low platelet count. I will observe for now.   We will continue low-dose Revlimid as prescribed.

## 2017-05-14 NOTE — Patient Instructions (Signed)

## 2017-05-14 NOTE — Progress Notes (Signed)
No allergies to eggs or soy. No problems with anesthesia.  Pt given Emmi instructions for colonoscopy  No oxygen use  No diet drug use  

## 2017-05-14 NOTE — Assessment & Plan Note (Signed)
He is currently on low-dose Revlimid as a form of maintenance treatment In the meantime, he will continue aspirin, calcium with vitamin D He denies recent dental issues. He will continue Zometa every 3 months I plan to reduce dose of Zometa today due to mild renal failure

## 2017-05-17 LAB — KAPPA/LAMBDA LIGHT CHAINS
IG LAMBDA FREE LIGHT CHAIN: 40.8 mg/L — AB (ref 5.7–26.3)
Ig Kappa Free Light Chain: 44.4 mg/L — ABNORMAL HIGH (ref 3.3–19.4)
KAPPA/LAMBDA FLC RATIO: 1.09 (ref 0.26–1.65)

## 2017-05-19 LAB — MULTIPLE MYELOMA PANEL, SERUM
ALBUMIN SERPL ELPH-MCNC: 3.2 g/dL (ref 2.9–4.4)
Albumin/Glob SerPl: 1.1 (ref 0.7–1.7)
Alpha 1: 0.2 g/dL (ref 0.0–0.4)
Alpha2 Glob SerPl Elph-Mcnc: 0.8 g/dL (ref 0.4–1.0)
B-GLOBULIN SERPL ELPH-MCNC: 0.7 g/dL (ref 0.7–1.3)
GAMMA GLOB SERPL ELPH-MCNC: 1.3 g/dL (ref 0.4–1.8)
GLOBULIN, TOTAL: 3 g/dL (ref 2.2–3.9)
IGA/IMMUNOGLOBULIN A, SERUM: 279 mg/dL (ref 61–437)
IgG, Qn, Serum: 1148 mg/dL (ref 700–1600)
IgM, Qn, Serum: 15 mg/dL (ref 15–143)
Total Protein: 6.2 g/dL (ref 6.0–8.5)

## 2017-05-21 ENCOUNTER — Other Ambulatory Visit: Payer: Self-pay | Admitting: *Deleted

## 2017-05-21 MED ORDER — LENALIDOMIDE 5 MG PO CAPS: ORAL_CAPSULE | ORAL | 0 refills | Status: DC

## 2017-05-28 ENCOUNTER — Encounter: Payer: Self-pay | Admitting: Gastroenterology

## 2017-05-28 ENCOUNTER — Ambulatory Visit (AMBULATORY_SURGERY_CENTER): Payer: Medicare Other | Admitting: Gastroenterology

## 2017-05-28 VITALS — BP 123/66 | HR 58 | Temp 97.8°F | Resp 13 | Ht 73.0 in | Wt 191.0 lb

## 2017-05-28 DIAGNOSIS — Z1212 Encounter for screening for malignant neoplasm of rectum: Secondary | ICD-10-CM

## 2017-05-28 DIAGNOSIS — D122 Benign neoplasm of ascending colon: Secondary | ICD-10-CM | POA: Diagnosis not present

## 2017-05-28 DIAGNOSIS — Z1211 Encounter for screening for malignant neoplasm of colon: Secondary | ICD-10-CM

## 2017-05-28 MED ORDER — SODIUM CHLORIDE 0.9 % IV SOLN: 500.0000 mL | INTRAVENOUS | Status: DC

## 2017-05-28 NOTE — Progress Notes (Signed)
Report to PACU, RN, vss, BBS= Clear.  

## 2017-05-28 NOTE — Op Note (Signed)
[place] Patient Name: [name] Procedure Date: 05/28/2017 11:24 AM MRN: [id number] Endoscopist: Mauri Pole , MD Age: 71 Referring MD:  Date of Birth: 1946/04/03 Gender: Male Account #: [id number] Procedure:                Colonoscopy Indications:              Screening for colorectal malignant neoplasm Medicines:                Monitored Anesthesia Care Procedure:                Pre-Anesthesia Assessment:                           - Prior to the procedure, a History and Physical                            was performed, and patient medications and                            allergies were reviewed. The patient's tolerance of                            previous anesthesia was also reviewed. The risks                            and benefits of the procedure and the sedation                            options and risks were discussed with the patient.                            All questions were answered, and informed consent                            was obtained. Prior Anticoagulants: The patient has                            taken no previous anticoagulant or antiplatelet                            agents. ASA Grade Assessment: III - A patient with                            severe systemic disease. After reviewing the risks                            and benefits, the patient was deemed in                            satisfactory condition to undergo the procedure.                           After obtaining informed consent, the colonoscope  was passed under direct vision. Throughout the                            procedure, the patient's blood pressure, pulse, and                            oxygen saturations were monitored continuously. The                            Colonoscope was introduced through the anus and                            advanced to the the cecum, identified by                            appendiceal orifice  and ileocecal valve. The                            colonoscopy was performed without difficulty. The                            patient tolerated the procedure well. The quality                            of the bowel preparation was adequate. The                            ileocecal valve, appendiceal orifice, and rectum                            were photographed. Scope In: 11:33:18 AM Scope Out: 11:45:05 AM Scope Withdrawal Time: 0 hours 10 minutes 22 seconds  Total Procedure Duration: 0 hours 11 minutes 47 seconds  Findings:                 The perianal and digital rectal examinations were                            normal.                           A 5 mm polyp was found in the ascending colon. The                            polyp was sessile. The polyp was removed with a                            cold snare. Resection and retrieval were complete.                           Non-bleeding internal hemorrhoids were found during                            retroflexion. The hemorrhoids were small.  The exam was otherwise without abnormality. Complications:            No immediate complications. Estimated Blood Loss:     Estimated blood loss was minimal. Impression:               - One 5 mm polyp in the ascending colon, removed                            with a cold snare. Resected and retrieved.                           - Non-bleeding internal hemorrhoids.                           - The examination was otherwise normal. Recommendation:           - Patient has a contact number available for                            emergencies. The signs and symptoms of potential                            delayed complications were discussed with the                            patient. Return to normal activities tomorrow.                            Written discharge instructions were provided to the                            patient.                           - Resume  previous diet.                           - Continue present medications.                           - Await pathology results.                           - Repeat colonoscopy in 5-10 years for surveillance                            based on pathology results. Mauri Pole, MD 05/28/2017 11:49:19 AM This report has been signed electronically.

## 2017-05-28 NOTE — Progress Notes (Signed)
Called to room to assist during endoscopic procedure.  Patient ID and intended procedure confirmed with present staff. Received instructions for my participation in the procedure from the performing physician.  

## 2017-05-28 NOTE — Progress Notes (Signed)
Pt's states no medical or surgical changes since previsit or office visit. 

## 2017-05-28 NOTE — Patient Instructions (Signed)
YOU HAD AN ENDOSCOPIC PROCEDURE TODAY AT THE [facility]:   Refer to the procedure report that was given to you for any specific questions about what was found during the examination.  If the procedure report does not answer your questions, please call your gastroenterologist to clarify.  If you requested that your care partner not be given the details of your procedure findings, then the procedure report has been included in a sealed envelope for you to review at your convenience later.  YOU SHOULD EXPECT: Some feelings of bloating in the abdomen. Passage of more gas than usual.  Walking can help get rid of the air that was put into your GI tract during the procedure and reduce the bloating. If you had a lower endoscopy (such as a colonoscopy or flexible sigmoidoscopy) you may notice spotting of blood in your stool or on the toilet paper. If you underwent a bowel prep for your procedure, you may not have a normal bowel movement for a few days.  Please Note:  You might notice some irritation and congestion in your nose or some drainage.  This is from the oxygen used during your procedure.  There is no need for concern and it should clear up in a day or so.  SYMPTOMS TO REPORT IMMEDIATELY:   Following lower endoscopy (colonoscopy or flexible sigmoidoscopy):  Excessive amounts of blood in the stool  Significant tenderness or worsening of abdominal pains  Swelling of the abdomen that is new, acute  Fever of 100F or higher    For urgent or emergent issues, a gastroenterologist can be reached at any hour by calling [phone].   DIET:  We do recommend a small meal at first, but then you may proceed to your regular diet.  Drink plenty of fluids but you should avoid alcoholic beverages for 24 hours.  ACTIVITY:  You should plan to take it easy for the rest of today and you should NOT DRIVE or use heavy machinery until tomorrow (because of the sedation medicines used during the test).     FOLLOW UP: Our staff will call the number listed on your records the next business day following your procedure to check on you and address any questions or concerns that you may have regarding the information given to you following your procedure. If we do not reach you, we will leave a message.  However, if you are feeling well and you are not experiencing any problems, there is no need to return our call.  We will assume that you have returned to your regular daily activities without incident.  If any biopsies were taken you will be contacted by phone or by letter within the next 1-3 weeks.  Please call us at [phone] if you have not heard about the biopsies in 3 weeks.    SIGNATURES/CONFIDENTIALITY: You and/or your care partner have signed paperwork which will be entered into your electronic medical record.  These signatures attest to the fact that that the information above on your After Visit Summary has been reviewed and is understood.  Full responsibility of the confidentiality of this discharge information lies with you and/or your care-partner  Polyp and hemorrhoid information given.. 

## 2017-05-31 ENCOUNTER — Telehealth: Payer: Self-pay

## 2017-05-31 NOTE — Telephone Encounter (Signed)
Attempted to reach pt. With follow up call following endoscopic procedure 05/28/2017.   LM on pt. Ans. Machine.  Will try to reach pt. Again later today.

## 2017-05-31 NOTE — Telephone Encounter (Signed)
Patient returning phone call states he is doing much better.

## 2017-06-04 ENCOUNTER — Encounter: Payer: Self-pay | Admitting: Gastroenterology

## 2017-06-21 ENCOUNTER — Other Ambulatory Visit: Payer: Self-pay

## 2017-06-21 MED ORDER — LENALIDOMIDE 5 MG PO CAPS: ORAL_CAPSULE | ORAL | 0 refills | Status: DC

## 2017-06-22 ENCOUNTER — Other Ambulatory Visit: Payer: Self-pay

## 2017-06-22 MED ORDER — LENALIDOMIDE 5 MG PO CAPS: ORAL_CAPSULE | ORAL | 0 refills | Status: DC

## 2017-06-25 ENCOUNTER — Ambulatory Visit (HOSPITAL_BASED_OUTPATIENT_CLINIC_OR_DEPARTMENT_OTHER): Payer: Medicare Other

## 2017-06-25 VITALS — BP 122/78 | HR 80 | Temp 97.0°F | Resp 18

## 2017-06-25 DIAGNOSIS — C9001 Multiple myeloma in remission: Secondary | ICD-10-CM | POA: Diagnosis not present

## 2017-06-25 DIAGNOSIS — Z452 Encounter for adjustment and management of vascular access device: Secondary | ICD-10-CM | POA: Diagnosis present

## 2017-06-25 DIAGNOSIS — Z95828 Presence of other vascular implants and grafts: Secondary | ICD-10-CM

## 2017-06-25 MED ORDER — SODIUM CHLORIDE 0.9 % IJ SOLN
10.0000 mL | INTRAMUSCULAR | Status: DC | PRN
  Administered 2017-06-25: 10 mL via INTRAVENOUS
  Filled 2017-06-25: qty 10

## 2017-06-25 MED ORDER — HEPARIN SOD (PORK) LOCK FLUSH 100 UNIT/ML IV SOLN
500.0000 [IU] | Freq: Once | INTRAVENOUS | Status: AC | PRN
  Administered 2017-06-25: 500 [IU] via INTRAVENOUS
  Filled 2017-06-25: qty 5

## 2017-06-25 NOTE — Patient Instructions (Signed)
Implanted Port Home Guide An implanted port is a type of central line that is placed under the skin. Central lines are used to provide IV access when treatment or nutrition needs to be given through a person's veins. Implanted ports are used for long-term IV access. An implanted port may be placed because:  You need IV medicine that would be irritating to the small veins in your hands or arms.  You need long-term IV medicines, such as antibiotics.  You need IV nutrition for a long period.  You need frequent blood draws for lab tests.  You need dialysis.  Implanted ports are usually placed in the chest area, but they can also be placed in the upper arm, the abdomen, or the leg. An implanted port has two main parts:  Reservoir. The reservoir is round and will appear as a small, raised area under your skin. The reservoir is the part where a needle is inserted to give medicines or draw blood.  Catheter. The catheter is a thin, flexible tube that extends from the reservoir. The catheter is placed into a large vein. Medicine that is inserted into the reservoir goes into the catheter and then into the vein.  How will I care for my incision site? Do not get the incision site wet. Bathe or shower as directed by your health care provider. How is my port accessed? Special steps must be taken to access the port:  Before the port is accessed, a numbing cream can be placed on the skin. This helps numb the skin over the port site.  Your health care provider uses a sterile technique to access the port. ? Your health care provider must put on a mask and sterile gloves. ? The skin over your port is cleaned carefully with an antiseptic and allowed to dry. ? The port is gently pinched between sterile gloves, and a needle is inserted into the port.  Only "non-coring" port needles should be used to access the port. Once the port is accessed, a blood return should be checked. This helps ensure that the port  is in the vein and is not clogged.  If your port needs to remain accessed for a constant infusion, a clear (transparent) bandage will be placed over the needle site. The bandage and needle will need to be changed every week, or as directed by your health care provider.  Keep the bandage covering the needle clean and dry. Do not get it wet. Follow your health care provider's instructions on how to take a shower or bath while the port is accessed.  If your port does not need to stay accessed, no bandage is needed over the port.  What is flushing? Flushing helps keep the port from getting clogged. Follow your health care provider's instructions on how and when to flush the port. Ports are usually flushed with saline solution or a medicine called heparin. The need for flushing will depend on how the port is used.  If the port is used for intermittent medicines or blood draws, the port will need to be flushed: ? After medicines have been given. ? After blood has been drawn. ? As part of routine maintenance.  If a constant infusion is running, the port may not need to be flushed.  How long will my port stay implanted? The port can stay in for as long as your health care provider thinks it is needed. When it is time for the port to come out, surgery will be   done to remove it. The procedure is similar to the one performed when the port was put in. When should I seek immediate medical care? When you have an implanted port, you should seek immediate medical care if:  You notice a bad smell coming from the incision site.  You have swelling, redness, or drainage at the incision site.  You have more swelling or pain at the port site or the surrounding area.  You have a fever that is not controlled with medicine.  This information is not intended to replace advice given to you by your health care provider. Make sure you discuss any questions you have with your health care provider. Document  Released: 07/13/2005 Document Revised: 12/19/2015 Document Reviewed: 03/20/2013 Elsevier Interactive Patient Education  2017 [employer]  

## 2017-07-07 ENCOUNTER — Ambulatory Visit (INDEPENDENT_AMBULATORY_CARE_PROVIDER_SITE_OTHER): Payer: Medicare Other | Admitting: Podiatry

## 2017-07-07 ENCOUNTER — Encounter: Payer: Self-pay | Admitting: Podiatry

## 2017-07-07 DIAGNOSIS — M79676 Pain in unspecified toe(s): Secondary | ICD-10-CM | POA: Diagnosis not present

## 2017-07-07 DIAGNOSIS — B351 Tinea unguium: Secondary | ICD-10-CM | POA: Diagnosis not present

## 2017-07-07 NOTE — Progress Notes (Signed)
[name] presents today chief complaint of painful elongated toenails bilaterally.  Objective: Toenails are long thick yellow dystrophic clinically mycotic pulses remain barely palpable.  Capillary fill time is not extended.  Assessment: Pain in limb secondary to onychomycosis.  Plan: Debridement of toenails in thickness and length secondary to pain.  Follow-up with him in 3 months.

## 2017-07-14 ENCOUNTER — Other Ambulatory Visit: Payer: Self-pay

## 2017-07-14 MED ORDER — LENALIDOMIDE 5 MG PO CAPS: ORAL_CAPSULE | ORAL | 0 refills | Status: DC

## 2017-08-06 ENCOUNTER — Inpatient Hospital Stay: Payer: Medicare Other

## 2017-08-06 ENCOUNTER — Other Ambulatory Visit: Payer: Medicare Other

## 2017-08-06 ENCOUNTER — Telehealth: Payer: Self-pay | Admitting: Hematology and Oncology

## 2017-08-06 ENCOUNTER — Inpatient Hospital Stay: Payer: Medicare Other | Attending: Hematology and Oncology | Admitting: Hematology and Oncology

## 2017-08-06 ENCOUNTER — Ambulatory Visit: Payer: Medicare Other | Admitting: Hematology and Oncology

## 2017-08-06 ENCOUNTER — Encounter: Payer: Self-pay | Admitting: Hematology and Oncology

## 2017-08-06 ENCOUNTER — Ambulatory Visit: Payer: Medicare Other

## 2017-08-06 DIAGNOSIS — C61 Malignant neoplasm of prostate: Secondary | ICD-10-CM | POA: Diagnosis not present

## 2017-08-06 DIAGNOSIS — C9001 Multiple myeloma in remission: Secondary | ICD-10-CM | POA: Insufficient documentation

## 2017-08-06 DIAGNOSIS — C9000 Multiple myeloma not having achieved remission: Secondary | ICD-10-CM

## 2017-08-06 DIAGNOSIS — D61818 Other pancytopenia: Secondary | ICD-10-CM | POA: Diagnosis not present

## 2017-08-06 DIAGNOSIS — Z7982 Long term (current) use of aspirin: Secondary | ICD-10-CM | POA: Insufficient documentation

## 2017-08-06 DIAGNOSIS — Z79899 Other long term (current) drug therapy: Secondary | ICD-10-CM | POA: Diagnosis not present

## 2017-08-06 DIAGNOSIS — Z95828 Presence of other vascular implants and grafts: Secondary | ICD-10-CM

## 2017-08-06 LAB — CBC WITH DIFFERENTIAL/PLATELET
BASOS ABS: 0.1 10*3/uL (ref 0.0–0.1)
Basophils Relative: 1 %
Eosinophils Absolute: 0.5 10*3/uL (ref 0.0–0.5)
Eosinophils Relative: 8 %
HCT: 37.2 % — ABNORMAL LOW (ref 38.4–49.9)
HEMOGLOBIN: 12.6 g/dL — AB (ref 13.0–17.1)
LYMPHS PCT: 18 %
Lymphs Abs: 1 10*3/uL (ref 0.9–3.3)
MCH: 34.9 pg — ABNORMAL HIGH (ref 27.2–33.4)
MCHC: 33.9 g/dL (ref 32.0–36.0)
MCV: 103 fL — AB (ref 79.3–98.0)
MONO ABS: 0.9 10*3/uL (ref 0.1–0.9)
Monocytes Relative: 16 %
NEUTROS ABS: 3.2 10*3/uL (ref 1.5–6.5)
Neutrophils Relative %: 57 %
Platelets: 117 10*3/uL — ABNORMAL LOW (ref 140–400)
RBC: 3.61 MIL/uL — AB (ref 4.20–5.82)
RDW: 13.7 % (ref 11.0–15.6)
WBC: 5.7 10*3/uL (ref 4.0–10.3)

## 2017-08-06 LAB — COMPREHENSIVE METABOLIC PANEL
ALK PHOS: 46 U/L (ref 40–150)
ALT: 17 U/L (ref 0–55)
AST: 18 U/L (ref 5–34)
Albumin: 3.1 g/dL — ABNORMAL LOW (ref 3.5–5.0)
Anion gap: 7 (ref 3–11)
BILIRUBIN TOTAL: 0.6 mg/dL (ref 0.2–1.2)
BUN: 17 mg/dL (ref 7–26)
CO2: 27 mmol/L (ref 22–29)
CREATININE: 1.17 mg/dL (ref 0.70–1.30)
Calcium: 8.7 mg/dL (ref 8.4–10.4)
Chloride: 107 mmol/L (ref 98–109)
GFR calc Af Amer: >60 mL/min (ref >60)
Glucose, Bld: 105 mg/dL (ref 70–140)
Potassium: 3.6 mmol/L (ref 3.5–5.1)
Sodium: 141 mmol/L (ref 136–145)
TOTAL PROTEIN: 6.3 g/dL — AB (ref 6.4–8.3)

## 2017-08-06 MED ORDER — SODIUM CHLORIDE 0.9 % IJ SOLN
10.0000 mL | INTRAMUSCULAR | Status: DC | PRN
  Administered 2017-08-06: 10 mL via INTRAVENOUS
  Filled 2017-08-06: qty 10

## 2017-08-06 MED ORDER — HEPARIN SOD (PORK) LOCK FLUSH 100 UNIT/ML IV SOLN
500.0000 [IU] | Freq: Once | INTRAVENOUS | Status: AC | PRN
  Administered 2017-08-06: 500 [IU] via INTRAVENOUS
  Filled 2017-08-06: qty 5

## 2017-08-06 MED ORDER — HEPARIN SOD (PORK) LOCK FLUSH 100 UNIT/ML IV SOLN
500.0000 [IU] | Freq: Once | INTRAVENOUS | Status: DC | PRN
  Filled 2017-08-06: qty 5

## 2017-08-06 MED ORDER — ZOLEDRONIC ACID 4 MG/5ML IV CONC
3.5000 mg | Freq: Once | INTRAVENOUS | Status: AC
  Administered 2017-08-06: 3.5 mg via INTRAVENOUS
  Filled 2017-08-06: qty 4.38

## 2017-08-06 NOTE — Telephone Encounter (Signed)
Gave patient avs and calendar with appts per 1/11 los.

## 2017-08-06 NOTE — Assessment & Plan Note (Signed)
He is currently on low-dose Revlimid as a form of maintenance treatment In the meantime, he will continue aspirin, calcium with vitamin D He denies recent dental issues. He will continue Zometa every 3 months I plan to reduce dose of Zometa today due to mild renal failure

## 2017-08-06 NOTE — Progress Notes (Signed)
[place] OFFICE PROGRESS NOTE  Patient Care Team: Jinny Sanders, MD as PCP - General Jeanann Lewandowsky, MD as Consulting Physician (Hematology and Oncology) Cathlean Marseilles, NP as Nurse Practitioner (Hematology and Oncology) Raynelle Bring, MD as Consulting Physician (Urology) Heath Lark, MD as Consulting Physician (Hematology and Oncology) Garrel Ridgel, [place] as Consulting Physician (Podiatry) Morton Stall, Howell Rucks, NP as Nurse Practitioner (Pain Medicine) Druscilla Brownie, MD as Consulting Physician (Dermatology) Bryson Ha, OD as Consulting Physician (Optometry)  SUMMARY OF ONCOLOGIC HISTORY:  Principle Diagnosis: Kappa light chain multiple myeloma, ISS III with diagnosis established in December 2012.  Prior Therapy:  1. Velcade, Cytoxan, and Decadron on a weekly basis from 07/10/2011 through 10/23/2011; 2. High-dose chemotherapy on 11/23/2011, consisting of carmustine 600 mg IV. He received cytarabine 400 mg IV on 12/24/2011, 12/25/2011, 12/26/2011, and 12/27/2011. He received etoposide 300 mg IV daily from 12/24/2011 through 12/27/2011, four doses. He received melphalan 280 mg IV on 12/28/2011.  3. 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  Current therapy: -Revlimid 5 mg daily, 3 weeks on, 1 week off. Revlimid was started on 05/11/2012.  Zometa was started on 09/02/2012.  Dental clearance was obtained.  Zometa 4 mg was placed on hold due to current osteomyelitis but restarted on 06/23/2013.      He was diagnosed with prostate cancer and was started on anti-hormonal therapy INTERVAL HISTORY: Please see below for problem oriented charting. He returns today for further follow-up. He continues to have chronic back pain, stable He denies recent infection  He has no recent dental issues The patient denies any recent signs or symptoms of bleeding such as spontaneous epistaxis, hematuria or hematochezia. His PSA is well controlled with current  prescription hormonal injection from his urologist  REVIEW OF SYSTEMS:   Constitutional: Denies fevers, chills or abnormal weight loss Eyes: Denies blurriness of vision Ears, nose, mouth, throat, and face: Denies mucositis or sore throat Respiratory: Denies cough, dyspnea or wheezes Cardiovascular: Denies palpitation, chest discomfort or lower extremity swelling Gastrointestinal:  Denies nausea, heartburn or change in bowel habits Skin: Denies abnormal skin rashes Lymphatics: Denies new lymphadenopathy or easy bruising Neurological:Denies numbness, tingling or new weaknesses Behavioral/Psych: Mood is stable, no new changes  All other systems were reviewed with the patient and are negative.  I have reviewed the past medical history, past surgical history, social history and family history with the patient and they are unchanged from previous note.  ALLERGIES:  is allergic to celebrex [celecoxib]; cymbalta [duloxetine hcl]; hydromorphone hcl; meloxicam; oxycontin [oxycodone]; percocet [oxycodone-acetaminophen]; vicodin [hydrocodone-acetaminophen]; and lyrica [pregabalin].  MEDICATIONS:  Current Outpatient Medications  Medication Sig Dispense Refill  . aspirin 81 MG tablet Take 81 mg by mouth daily.    . B Complex Vitamins (VITAMIN-B COMPLEX) TABS Take by mouth.    . Calcium Carbonate-Vitamin D 600-400 MG-UNIT per tablet Take by mouth.    . cholecalciferol (VITAMIN D) 1000 UNITS tablet Take 1,000 Units by mouth daily.    . fentaNYL (DURAGESIC - DOSED MCG/HR) 50 MCG/HR Place 1 patch (50 mcg total) onto the skin every 3 (three) days. 10 patch 0  . lenalidomide (REVLIMID) 5 MG capsule Take one capsule by mouth daily for 21 days, then off 7 days. 21 capsule 0  . lidocaine-prilocaine (EMLA) cream Apply topically as needed. Apply to port site one hour before treatment and cover with plastic wrap 30 g 2  . NONFORMULARY OR COMPOUNDED ITEM Baclofn/amitrp/ketamn cream topically to feet TID prn  for  neuropathy    . oxyCODONE (OXY IR/ROXICODONE) 5 MG immediate release tablet TK 1 T PO Q 4 H PRN P  0  . pantoprazole (PROTONIX) 40 MG tablet Take 40 mg by mouth daily.  3  . venlafaxine (EFFEXOR) 25 MG tablet Take by mouth.     Current Facility-Administered Medications  Medication Dose Route Frequency Provider Last Rate Last Dose  . 0.9 %  sodium chloride infusion  500 mL Intravenous Continuous Nandigam, Venia Minks, MD       Facility-Administered Medications Ordered in Other Visits  Medication Dose Route Frequency Provider Last Rate Last Dose  . sodium chloride 0.9 % injection 10 mL  10 mL Intravenous PRN Alvy Bimler, Cambree Hendrix, MD   10 mL at 08/06/17 1512    PHYSICAL EXAMINATION: ECOG PERFORMANCE STATUS: 1 - Symptomatic but completely ambulatory  Vitals:   08/06/17 1322  BP: 120/69  Pulse: 63  Resp: 18  Temp: (!) 97.3 F (36.3 C)  SpO2: 100%   Filed Weights   08/06/17 1322  Weight: 188 lb 1.6 oz (85.3 kg)    GENERAL:alert, no distress and comfortable SKIN: skin color, texture, turgor are normal, no rashes or significant lesions EYES: normal, Conjunctiva are pink and non-injected, sclera clear OROPHARYNX:no exudate, no erythema and lips, buccal mucosa, and tongue normal  NECK: supple, thyroid normal size, non-tender, without nodularity LYMPH:  no palpable lymphadenopathy in the cervical, axillary or inguinal LUNGS: clear to auscultation and percussion with normal breathing effort HEART: regular rate & rhythm and no murmurs and no lower extremity edema ABDOMEN:abdomen soft, non-tender and normal bowel sounds Musculoskeletal:no cyanosis of digits and no clubbing  NEURO: alert & oriented x 3 with fluent speech, no focal motor/sensory deficits  LABORATORY DATA:  I have reviewed the data as listed    Component Value Date/Time   NA 141 08/06/2017 1209   NA 139 05/14/2017 1145   K 3.6 08/06/2017 1209   K 3.8 05/14/2017 1145   CL 107 08/06/2017 1209   CL 108 (H) 09/30/2013 0945   CL  110 (H) 12/29/2012 1151   CO2 27 08/06/2017 1209   CO2 27 05/14/2017 1145   GLUCOSE 105 08/06/2017 1209   GLUCOSE 109 05/14/2017 1145   GLUCOSE 127 (H) 12/29/2012 1151   BUN 17 08/06/2017 1209   BUN 17.9 05/14/2017 1145   CREATININE 1.17 08/06/2017 1209   CREATININE 1.3 05/14/2017 1145   CALCIUM 8.7 08/06/2017 1209   CALCIUM 9.3 05/14/2017 1145   PROT 6.3 (L) 08/06/2017 1209   PROT 6.2 05/14/2017 1145   PROT 6.6 05/14/2017 1145   ALBUMIN 3.1 (L) 08/06/2017 1209   ALBUMIN 3.3 (L) 05/14/2017 1145   AST 18 08/06/2017 1209   AST 19 05/14/2017 1145   ALT 17 08/06/2017 1209   ALT 18 05/14/2017 1145   ALKPHOS 46 08/06/2017 1209   ALKPHOS 45 05/14/2017 1145   BILITOT 0.6 08/06/2017 1209   BILITOT 0.67 05/14/2017 1145   GFRNONAA >60 08/06/2017 1209   GFRNONAA >60 09/30/2013 0945   GFRAA >60 08/06/2017 1209   GFRAA >60 09/30/2013 0945    No results found for: SPEP, UPEP  Lab Results  Component Value Date   WBC 5.7 08/06/2017   NEUTROABS 3.2 08/06/2017   HGB 12.6 (L) 08/06/2017   HCT 37.2 (L) 08/06/2017   MCV 103.0 (H) 08/06/2017   PLT 117 (L) 08/06/2017      Chemistry      Component Value Date/Time   NA 141 08/06/2017  1209   NA 139 05/14/2017 1145   K 3.6 08/06/2017 1209   K 3.8 05/14/2017 1145   CL 107 08/06/2017 1209   CL 108 (H) 09/30/2013 0945   CL 110 (H) 12/29/2012 1151   CO2 27 08/06/2017 1209   CO2 27 05/14/2017 1145   BUN 17 08/06/2017 1209   BUN 17.9 05/14/2017 1145   CREATININE 1.17 08/06/2017 1209   CREATININE 1.3 05/14/2017 1145      Component Value Date/Time   CALCIUM 8.7 08/06/2017 1209   CALCIUM 9.3 05/14/2017 1145   ALKPHOS 46 08/06/2017 1209   ALKPHOS 45 05/14/2017 1145   AST 18 08/06/2017 1209   AST 19 05/14/2017 1145   ALT 17 08/06/2017 1209   ALT 18 05/14/2017 1145   BILITOT 0.6 08/06/2017 1209   BILITOT 0.67 05/14/2017 1145       ASSESSMENT & PLAN:  Multiple myeloma in remission ([place]) He is currently on low-dose Revlimid as a  form of maintenance treatment In the meantime, he will continue aspirin, calcium with vitamin D He denies recent dental issues. He will continue Zometa every 3 months I plan to reduce dose of Zometa today due to mild renal failure  Malignant neoplasm of prostate ([place]) He had recent elevated PSA which show significant drop after he was started on hormonal manipulation I would defer to his urologist for further management He has some hot flashes, currently well managed with low-dose Effexor  Acquired pancytopenia ([place]) This is likely due to recent treatment. The patient denies recent history of bleeding such as epistaxis, hematuria or hematochezia. He is asymptomatic from the low platelet count. I will observe for now.   We will continue low-dose Revlimid as prescribed.   No orders of the defined types were placed in this encounter.  All questions were answered. The patient knows to call the clinic with any problems, questions or concerns. No barriers to learning was detected. I spent 15 minutes counseling the patient face to face. The total time spent in the appointment was 20 minutes and more than 50% was on counseling and review of test results     Heath Lark, MD 08/06/2017 4:15 PM

## 2017-08-06 NOTE — Assessment & Plan Note (Signed)
He had recent elevated PSA which show significant drop after he was started on hormonal manipulation I would defer to his urologist for further management He has some hot flashes, currently well managed with low-dose Effexor

## 2017-08-06 NOTE — Patient Instructions (Signed)

## 2017-08-06 NOTE — Assessment & Plan Note (Signed)
This is likely due to recent treatment. The patient denies recent history of bleeding such as epistaxis, hematuria or hematochezia. He is asymptomatic from the low platelet count. I will observe for now.   We will continue low-dose Revlimid as prescribed.

## 2017-08-09 LAB — KAPPA/LAMBDA LIGHT CHAINS
Kappa free light chain: 54.5 mg/L — ABNORMAL HIGH (ref 3.3–19.4)
Kappa, lambda light chain ratio: 1.46 (ref 0.26–1.65)
Lambda free light chains: 37.3 mg/L — ABNORMAL HIGH (ref 5.7–26.3)

## 2017-08-10 LAB — MULTIPLE MYELOMA PANEL, SERUM
ALBUMIN SERPL ELPH-MCNC: 3.1 g/dL (ref 2.9–4.4)
ALPHA2 GLOB SERPL ELPH-MCNC: 0.7 g/dL (ref 0.4–1.0)
Albumin/Glob SerPl: 1.1 (ref 0.7–1.7)
Alpha 1: 0.2 g/dL (ref 0.0–0.4)
B-Globulin SerPl Elph-Mcnc: 0.8 g/dL (ref 0.7–1.3)
Gamma Glob SerPl Elph-Mcnc: 1.3 g/dL (ref 0.4–1.8)
Globulin, Total: 3 g/dL (ref 2.2–3.9)
IGG (IMMUNOGLOBIN G), SERUM: 1231 mg/dL (ref 700–1600)
IGM (IMMUNOGLOBULIN M), SRM: 14 mg/dL — AB (ref 15–143)
IgA: 296 mg/dL (ref 61–437)
TOTAL PROTEIN ELP: 6.1 g/dL (ref 6.0–8.5)

## 2017-08-12 ENCOUNTER — Other Ambulatory Visit: Payer: Self-pay

## 2017-08-12 MED ORDER — LENALIDOMIDE 5 MG PO CAPS: ORAL_CAPSULE | ORAL | 0 refills | Status: DC

## 2017-09-09 ENCOUNTER — Other Ambulatory Visit: Payer: Self-pay

## 2017-09-09 MED ORDER — LENALIDOMIDE 5 MG PO CAPS: ORAL_CAPSULE | ORAL | 0 refills | Status: DC

## 2017-09-17 ENCOUNTER — Inpatient Hospital Stay: Payer: Medicare Other | Attending: Hematology and Oncology

## 2017-09-17 VITALS — BP 123/79 | HR 75 | Temp 97.5°F | Resp 20

## 2017-09-17 DIAGNOSIS — C9001 Multiple myeloma in remission: Secondary | ICD-10-CM | POA: Diagnosis present

## 2017-09-17 DIAGNOSIS — Z452 Encounter for adjustment and management of vascular access device: Secondary | ICD-10-CM | POA: Diagnosis present

## 2017-09-17 DIAGNOSIS — Z95828 Presence of other vascular implants and grafts: Secondary | ICD-10-CM

## 2017-09-17 MED ORDER — HEPARIN SOD (PORK) LOCK FLUSH 100 UNIT/ML IV SOLN
500.0000 [IU] | Freq: Once | INTRAVENOUS | Status: AC | PRN
  Administered 2017-09-17: 500 [IU] via INTRAVENOUS
  Filled 2017-09-17: qty 5

## 2017-09-17 MED ORDER — SODIUM CHLORIDE 0.9 % IJ SOLN
10.0000 mL | INTRAMUSCULAR | Status: DC | PRN
  Administered 2017-09-17: 10 mL via INTRAVENOUS
  Filled 2017-09-17: qty 10

## 2017-09-17 NOTE — Patient Instructions (Signed)
Implanted Port Home Guide An implanted port is a type of central line that is placed under the skin. Central lines are used to provide IV access when treatment or nutrition needs to be given through a person's veins. Implanted ports are used for long-term IV access. An implanted port may be placed because:  You need IV medicine that would be irritating to the small veins in your hands or arms.  You need long-term IV medicines, such as antibiotics.  You need IV nutrition for a long period.  You need frequent blood draws for lab tests.  You need dialysis.  Implanted ports are usually placed in the chest area, but they can also be placed in the upper arm, the abdomen, or the leg. An implanted port has two main parts:  Reservoir. The reservoir is round and will appear as a small, raised area under your skin. The reservoir is the part where a needle is inserted to give medicines or draw blood.  Catheter. The catheter is a thin, flexible tube that extends from the reservoir. The catheter is placed into a large vein. Medicine that is inserted into the reservoir goes into the catheter and then into the vein.  How will I care for my incision site? Do not get the incision site wet. Bathe or shower as directed by your health care provider. How is my port accessed? Special steps must be taken to access the port:  Before the port is accessed, a numbing cream can be placed on the skin. This helps numb the skin over the port site.  Your health care provider uses a sterile technique to access the port. ? Your health care provider must put on a mask and sterile gloves. ? The skin over your port is cleaned carefully with an antiseptic and allowed to dry. ? The port is gently pinched between sterile gloves, and a needle is inserted into the port.  Only "non-coring" port needles should be used to access the port. Once the port is accessed, a blood return should be checked. This helps ensure that the port  is in the vein and is not clogged.  If your port needs to remain accessed for a constant infusion, a clear (transparent) bandage will be placed over the needle site. The bandage and needle will need to be changed every week, or as directed by your health care provider.  Keep the bandage covering the needle clean and dry. Do not get it wet. Follow your health care provider's instructions on how to take a shower or bath while the port is accessed.  If your port does not need to stay accessed, no bandage is needed over the port.  What is flushing? Flushing helps keep the port from getting clogged. Follow your health care provider's instructions on how and when to flush the port. Ports are usually flushed with saline solution or a medicine called heparin. The need for flushing will depend on how the port is used.  If the port is used for intermittent medicines or blood draws, the port will need to be flushed: ? After medicines have been given. ? After blood has been drawn. ? As part of routine maintenance.  If a constant infusion is running, the port may not need to be flushed.  How long will my port stay implanted? The port can stay in for as long as your health care provider thinks it is needed. When it is time for the port to come out, surgery will be   done to remove it. The procedure is similar to the one performed when the port was put in. When should I seek immediate medical care? When you have an implanted port, you should seek immediate medical care if:  You notice a bad smell coming from the incision site.  You have swelling, redness, or drainage at the incision site.  You have more swelling or pain at the port site or the surrounding area.  You have a fever that is not controlled with medicine.  This information is not intended to replace advice given to you by your health care provider. Make sure you discuss any questions you have with your health care provider. Document  Released: 07/13/2005 Document Revised: 12/19/2015 Document Reviewed: 03/20/2013 Elsevier Interactive Patient Education  2017 [employer]  

## 2017-10-06 ENCOUNTER — Encounter: Payer: Self-pay | Admitting: Podiatry

## 2017-10-06 ENCOUNTER — Ambulatory Visit (INDEPENDENT_AMBULATORY_CARE_PROVIDER_SITE_OTHER): Payer: Medicare Other | Admitting: Podiatry

## 2017-10-06 DIAGNOSIS — B351 Tinea unguium: Secondary | ICD-10-CM

## 2017-10-06 DIAGNOSIS — M79676 Pain in unspecified toe(s): Secondary | ICD-10-CM | POA: Diagnosis not present

## 2017-10-06 DIAGNOSIS — Q828 Other specified congenital malformations of skin: Secondary | ICD-10-CM

## 2017-10-06 NOTE — Progress Notes (Signed)
He presents today chief complaint of painful elongated toenails with corns and calluses.  Objective: Vital signs are stable he is alert and oriented x3 pulses are palpable.  Neurologic sensorium is nearly completely lost plantarly.  Deep tendon non-elicitable.  Toenails are long thick yellow dystrophic clinically mycotic.  Multiple reactive hyperkeratosis plantar aspect of the forefoot bilateral.  No open lesions or wounds are noted.  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  Plan: Debridement of all reactive hyperkeratotic tissue debridement of toenails bilaterally.

## 2017-10-07 ENCOUNTER — Other Ambulatory Visit: Payer: Self-pay

## 2017-10-07 MED ORDER — LENALIDOMIDE 5 MG PO CAPS: ORAL_CAPSULE | ORAL | 0 refills | Status: DC

## 2017-11-04 ENCOUNTER — Other Ambulatory Visit: Payer: Self-pay | Admitting: Hematology and Oncology

## 2017-11-04 DIAGNOSIS — C9001 Multiple myeloma in remission: Secondary | ICD-10-CM

## 2017-11-05 ENCOUNTER — Telehealth: Payer: Self-pay | Admitting: Hematology and Oncology

## 2017-11-05 ENCOUNTER — Inpatient Hospital Stay (HOSPITAL_BASED_OUTPATIENT_CLINIC_OR_DEPARTMENT_OTHER): Payer: Medicare Other | Admitting: Hematology and Oncology

## 2017-11-05 ENCOUNTER — Encounter: Payer: Self-pay | Admitting: Hematology and Oncology

## 2017-11-05 ENCOUNTER — Other Ambulatory Visit: Payer: Self-pay

## 2017-11-05 ENCOUNTER — Inpatient Hospital Stay: Payer: Medicare Other

## 2017-11-05 ENCOUNTER — Inpatient Hospital Stay: Payer: Medicare Other | Attending: Hematology and Oncology

## 2017-11-05 DIAGNOSIS — N183 Chronic kidney disease, stage 3 unspecified: Secondary | ICD-10-CM

## 2017-11-05 DIAGNOSIS — C9001 Multiple myeloma in remission: Secondary | ICD-10-CM

## 2017-11-05 DIAGNOSIS — D61818 Other pancytopenia: Secondary | ICD-10-CM

## 2017-11-05 DIAGNOSIS — C61 Malignant neoplasm of prostate: Secondary | ICD-10-CM | POA: Insufficient documentation

## 2017-11-05 DIAGNOSIS — Z95828 Presence of other vascular implants and grafts: Secondary | ICD-10-CM

## 2017-11-05 LAB — CBC WITH DIFFERENTIAL/PLATELET
BASOS PCT: 2 %
Basophils Absolute: 0.1 10*3/uL (ref 0.0–0.1)
Eosinophils Absolute: 0.8 10*3/uL — ABNORMAL HIGH (ref 0.0–0.5)
Eosinophils Relative: 22 %
HEMATOCRIT: 37.9 % — AB (ref 38.4–49.9)
HEMOGLOBIN: 12.9 g/dL — AB (ref 13.0–17.1)
Lymphocytes Relative: 27 %
Lymphs Abs: 1.1 10*3/uL (ref 0.9–3.3)
MCH: 34.7 pg — ABNORMAL HIGH (ref 27.2–33.4)
MCHC: 34 g/dL (ref 32.0–36.0)
MCV: 101.9 fL — ABNORMAL HIGH (ref 79.3–98.0)
MONOS PCT: 19 %
Monocytes Absolute: 0.7 10*3/uL (ref 0.1–0.9)
NEUTROS ABS: 1.2 10*3/uL — AB (ref 1.5–6.5)
NEUTROS PCT: 30 %
Platelets: 126 10*3/uL — ABNORMAL LOW (ref 140–400)
RBC: 3.72 MIL/uL — AB (ref 4.20–5.82)
RDW: 14.1 % (ref 11.0–14.6)
WBC: 3.9 10*3/uL — ABNORMAL LOW (ref 4.0–10.3)

## 2017-11-05 LAB — COMPREHENSIVE METABOLIC PANEL
ALK PHOS: 54 U/L (ref 40–150)
ALT: 17 U/L (ref 0–55)
ANION GAP: 8 (ref 3–11)
AST: 19 U/L (ref 5–34)
Albumin: 3.3 g/dL — ABNORMAL LOW (ref 3.5–5.0)
BUN: 22 mg/dL (ref 7–26)
CALCIUM: 9.3 mg/dL (ref 8.4–10.4)
CO2: 28 mmol/L (ref 22–29)
Chloride: 104 mmol/L (ref 98–109)
Creatinine, Ser: 1.6 mg/dL — ABNORMAL HIGH (ref 0.70–1.30)
GFR calc non Af Amer: 41 mL/min — ABNORMAL LOW (ref >60)
GFR, EST AFRICAN AMERICAN: 48 mL/min — AB (ref >60)
Glucose, Bld: 101 mg/dL (ref 70–140)
Potassium: 4.2 mmol/L (ref 3.5–5.1)
SODIUM: 140 mmol/L (ref 136–145)
Total Bilirubin: 0.6 mg/dL (ref 0.2–1.2)
Total Protein: 6.6 g/dL (ref 6.4–8.3)

## 2017-11-05 MED ORDER — LENALIDOMIDE 5 MG PO CAPS: ORAL_CAPSULE | ORAL | 0 refills | Status: DC

## 2017-11-05 MED ORDER — SODIUM CHLORIDE 0.9 % IJ SOLN
10.0000 mL | INTRAMUSCULAR | Status: DC | PRN
  Administered 2017-11-05: 10 mL via INTRAVENOUS
  Filled 2017-11-05: qty 10

## 2017-11-05 NOTE — Addendum Note (Signed)
Addended by: Phineas Inches on: 11/05/2017 01:40 PM   Modules accepted: Orders

## 2017-11-05 NOTE — Telephone Encounter (Signed)
Gave patient AVs and calendar of upcoming April through July appointments.  °

## 2017-11-05 NOTE — Assessment & Plan Note (Signed)
He has acute on chronic renal failure today It is not safe to proceed with Zometa We will proceed to cancel his Zometa and rescheduled to 3 months I emphasized the importance of adequate fluid hydration

## 2017-11-05 NOTE — Assessment & Plan Note (Signed)
This is likely due to recent treatment. The patient denies recent history of bleeding such as epistaxis, hematuria or hematochezia. He is asymptomatic from the low platelet count. I will observe for now.   We will continue low-dose Revlimid as prescribed.

## 2017-11-05 NOTE — Assessment & Plan Note (Signed)
He is currently on low-dose Revlimid as a form of maintenance treatment In the meantime, he will continue aspirin, calcium with vitamin D He denies recent dental issues. He will continue Zometa every 3 months

## 2017-11-05 NOTE — Progress Notes (Signed)
[place] OFFICE PROGRESS NOTE  Patient Care Team: Jinny Sanders, MD as PCP - General Jeanann Lewandowsky, MD as Consulting Physician (Hematology and Oncology) Cathlean Marseilles, NP as Nurse Practitioner (Hematology and Oncology) Raynelle Bring, MD as Consulting Physician (Urology) Heath Lark, MD as Consulting Physician (Hematology and Oncology) Weir, Romilda Garret, [place] as Consulting Physician (Podiatry) Morton Stall, Howell Rucks, NP as Nurse Practitioner (Pain Medicine) Druscilla Brownie, MD as Consulting Physician (Dermatology) Bryson Ha, OD as Consulting Physician (Optometry)  ASSESSMENT & PLAN:  Multiple myeloma in remission [facility]) He is currently on low-dose Revlimid as a form of maintenance treatment In the meantime, he will continue aspirin, calcium with vitamin D He denies recent dental issues. He will continue Zometa every 3 months  Malignant neoplasm of prostate [facility]) He is following up with urologist He is currently on hormonal manipulation I would defer to them for further management.  Chronic kidney disease, stage III (moderate) He has acute on chronic renal failure today It is not safe to proceed with Zometa We will proceed to cancel his Zometa and rescheduled to 3 months I emphasized the importance of adequate fluid hydration  Acquired pancytopenia ([place]) This is likely due to recent treatment. The patient denies recent history of bleeding such as epistaxis, hematuria or hematochezia. He is asymptomatic from the low platelet count. I will observe for now.   We will continue low-dose Revlimid as prescribed.   Orders Placed This Encounter  Procedures  . Comprehensive metabolic panel    Standing Status:   Future    Standing Expiration Date:   12/10/2018  . CBC with Differential/Platelet    Standing Status:   Future    Standing Expiration Date:   12/10/2018  . Kappa/lambda light chains    Standing Status:   Future    Standing Expiration Date:   12/10/2018   . Multiple Myeloma Panel (SPEP&IFE w/QIG)    Standing Status:   Future    Standing Expiration Date:   12/10/2018    INTERVAL HISTORY: Please see below for problem oriented charting. He returns for further follow-up He denies recent infection He is undergoing treatment and evaluation by urologist No recent urination issue No new bone pain. He denies recent side effects from Revlimid No recent dental issue  SUMMARY OF ONCOLOGIC HISTORY:  Principle Diagnosis: Kappa light chain multiple myeloma, ISS III with diagnosis established in December 2012.  Prior Therapy:  1. Velcade, Cytoxan, and Decadron on a weekly basis from 07/10/2011 through 10/23/2011; 2. High-dose chemotherapy on 11/23/2011, consisting of carmustine 600 mg IV. He received cytarabine 400 mg IV on 12/24/2011, 12/25/2011, 12/26/2011, and 12/27/2011. He received etoposide 300 mg IV daily from 12/24/2011 through 12/27/2011, four doses. He received melphalan 280 mg IV on 12/28/2011.  3. 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  Current therapy: -Revlimid 5 mg daily, 3 weeks on, 1 week off. Revlimid was started on 05/11/2012.  Zometa was started on 09/02/2012.  Dental clearance was obtained.  Zometa 4 mg was placed on hold due to current osteomyelitis but restarted on 06/23/2013.      REVIEW OF SYSTEMS:   Constitutional: Denies fevers, chills or abnormal weight loss Eyes: Denies blurriness of vision Ears, nose, mouth, throat, and face: Denies mucositis or sore throat Respiratory: Denies cough, dyspnea or wheezes Cardiovascular: Denies palpitation, chest discomfort or lower extremity swelling Gastrointestinal:  Denies nausea, heartburn or change in bowel habits Skin: Denies abnormal skin rashes Lymphatics: Denies new lymphadenopathy or easy  bruising Neurological:Denies numbness, tingling or new weaknesses Behavioral/Psych: Mood is stable, no new changes  All other systems were reviewed with the patient and  are negative.  I have reviewed the past medical history, past surgical history, social history and family history with the patient and they are unchanged from previous note.  ALLERGIES:  is allergic to celebrex [celecoxib]; cymbalta [duloxetine hcl]; hydromorphone hcl; meloxicam; oxycontin [oxycodone]; percocet [oxycodone-acetaminophen]; vicodin [hydrocodone-acetaminophen]; and lyrica [pregabalin].  MEDICATIONS:  Current Outpatient Medications  Medication Sig Dispense Refill  . aspirin 81 MG tablet Take 81 mg by mouth daily.    . B Complex Vitamins (VITAMIN-B COMPLEX) TABS Take by mouth.    . Calcium Carbonate-Vitamin D 600-400 MG-UNIT per tablet Take by mouth.    . cholecalciferol (VITAMIN D) 1000 UNITS tablet Take 1,000 Units by mouth daily.    . fentaNYL (DURAGESIC - DOSED MCG/HR) 75 MCG/HR Place onto the skin every 3 (three) days.     Marland Kitchen lenalidomide (REVLIMID) 5 MG capsule Take one capsule by mouth daily for 21 days, then off 7 days. 21 capsule 0  . lidocaine-prilocaine (EMLA) cream Apply topically as needed. Apply to port site one hour before treatment and cover with plastic wrap 30 g 2  . NONFORMULARY OR COMPOUNDED ITEM Baclofn/amitrp/ketamn cream topically to feet TID prn for neuropathy    . oxyCODONE (OXY IR/ROXICODONE) 5 MG immediate release tablet TK 1 T PO Q 4 H PRN P  0  . pantoprazole (PROTONIX) 40 MG tablet Take 40 mg by mouth daily.  3  . venlafaxine (EFFEXOR) 25 MG tablet Take by mouth.     Current Facility-Administered Medications  Medication Dose Route Frequency Provider Last Rate Last Dose  . 0.9 %  sodium chloride infusion  500 mL Intravenous Continuous Nandigam, Venia Minks, MD        PHYSICAL EXAMINATION: ECOG PERFORMANCE STATUS: 1 - Symptomatic but completely ambulatory  Vitals:   11/05/17 1328  BP: 117/62  Pulse: 63  Resp: 16  Temp: 98.1 F (36.7 C)  SpO2: 99%   Filed Weights   11/05/17 1328  Weight: 187 lb 3.2 oz (84.9 kg)    GENERAL:alert, no  distress and comfortable SKIN: skin color, texture, turgor are normal, no rashes or significant lesions EYES: normal, Conjunctiva are pink and non-injected, sclera clear OROPHARYNX:no exudate, no erythema and lips, buccal mucosa, and tongue normal  NECK: supple, thyroid normal size, non-tender, without nodularity LYMPH:  no palpable lymphadenopathy in the cervical, axillary or inguinal LUNGS: clear to auscultation and percussion with normal breathing effort HEART: regular rate & rhythm and no murmurs and no lower extremity edema ABDOMEN:abdomen soft, non-tender and normal bowel sounds Musculoskeletal:no cyanosis of digits and no clubbing  NEURO: alert & oriented x 3 with fluent speech, no focal motor/sensory deficits  LABORATORY DATA:  I have reviewed the data as listed    Component Value Date/Time   NA 140 11/05/2017 1248   NA 139 05/14/2017 1145   K 4.2 11/05/2017 1248   K 3.8 05/14/2017 1145   CL 104 11/05/2017 1248   CL 108 (H) 09/30/2013 0945   CL 110 (H) 12/29/2012 1151   CO2 28 11/05/2017 1248   CO2 27 05/14/2017 1145   GLUCOSE 101 11/05/2017 1248   GLUCOSE 109 05/14/2017 1145   GLUCOSE 127 (H) 12/29/2012 1151   BUN 22 11/05/2017 1248   BUN 17.9 05/14/2017 1145   CREATININE 1.60 (H) 11/05/2017 1248   CREATININE 1.3 05/14/2017 1145   CALCIUM 9.3  11/05/2017 1248   CALCIUM 9.3 05/14/2017 1145   PROT 6.6 11/05/2017 1248   PROT 6.2 05/14/2017 1145   PROT 6.6 05/14/2017 1145   ALBUMIN 3.3 (L) 11/05/2017 1248   ALBUMIN 3.3 (L) 05/14/2017 1145   AST 19 11/05/2017 1248   AST 19 05/14/2017 1145   ALT 17 11/05/2017 1248   ALT 18 05/14/2017 1145   ALKPHOS 54 11/05/2017 1248   ALKPHOS 45 05/14/2017 1145   BILITOT 0.6 11/05/2017 1248   BILITOT 0.67 05/14/2017 1145   GFRNONAA 41 (L) 11/05/2017 1248   GFRNONAA >60 09/30/2013 0945   GFRAA 48 (L) 11/05/2017 1248   GFRAA >60 09/30/2013 0945    No results found for: SPEP, UPEP  Lab Results  Component Value Date   WBC 3.9  (L) 11/05/2017   NEUTROABS 1.2 (L) 11/05/2017   HGB 12.9 (L) 11/05/2017   HCT 37.9 (L) 11/05/2017   MCV 101.9 (H) 11/05/2017   PLT 126 (L) 11/05/2017      Chemistry      Component Value Date/Time   NA 140 11/05/2017 1248   NA 139 05/14/2017 1145   K 4.2 11/05/2017 1248   K 3.8 05/14/2017 1145   CL 104 11/05/2017 1248   CL 108 (H) 09/30/2013 0945   CL 110 (H) 12/29/2012 1151   CO2 28 11/05/2017 1248   CO2 27 05/14/2017 1145   BUN 22 11/05/2017 1248   BUN 17.9 05/14/2017 1145   CREATININE 1.60 (H) 11/05/2017 1248   CREATININE 1.3 05/14/2017 1145      Component Value Date/Time   CALCIUM 9.3 11/05/2017 1248   CALCIUM 9.3 05/14/2017 1145   ALKPHOS 54 11/05/2017 1248   ALKPHOS 45 05/14/2017 1145   AST 19 11/05/2017 1248   AST 19 05/14/2017 1145   ALT 17 11/05/2017 1248   ALT 18 05/14/2017 1145   BILITOT 0.6 11/05/2017 1248   BILITOT 0.67 05/14/2017 1145      All questions were answered. The patient knows to call the clinic with any problems, questions or concerns. No barriers to learning was detected.  I spent 15 minutes counseling the patient face to face. The total time spent in the appointment was 20 minutes and more than 50% was on counseling and review of test results  Heath Lark, MD 11/05/2017 2:45 PM

## 2017-11-05 NOTE — Assessment & Plan Note (Signed)
He is following up with urologist He is currently on hormonal manipulation I would defer to them for further management.

## 2017-11-08 LAB — KAPPA/LAMBDA LIGHT CHAINS
Kappa free light chain: 73.1 mg/L — ABNORMAL HIGH (ref 3.3–19.4)
Kappa, lambda light chain ratio: 1.25 (ref 0.26–1.65)
Lambda free light chains: 58.3 mg/L — ABNORMAL HIGH (ref 5.7–26.3)

## 2017-11-10 LAB — MULTIPLE MYELOMA PANEL, SERUM
ALBUMIN SERPL ELPH-MCNC: 3.3 g/dL (ref 2.9–4.4)
ALBUMIN/GLOB SERPL: 1.2 (ref 0.7–1.7)
ALPHA 1: 0.2 g/dL (ref 0.0–0.4)
Alpha2 Glob SerPl Elph-Mcnc: 0.6 g/dL (ref 0.4–1.0)
B-GLOBULIN SERPL ELPH-MCNC: 0.8 g/dL (ref 0.7–1.3)
GLOBULIN, TOTAL: 2.8 g/dL (ref 2.2–3.9)
Gamma Glob SerPl Elph-Mcnc: 1.2 g/dL (ref 0.4–1.8)
IGA: 323 mg/dL (ref 61–437)
IgG (Immunoglobin G), Serum: 1218 mg/dL (ref 700–1600)
IgM (Immunoglobulin M), Srm: 23 mg/dL (ref 15–143)
Total Protein ELP: 6.1 g/dL (ref 6.0–8.5)

## 2017-12-03 ENCOUNTER — Other Ambulatory Visit: Payer: Self-pay | Admitting: *Deleted

## 2017-12-03 MED ORDER — LENALIDOMIDE 5 MG PO CAPS: ORAL_CAPSULE | ORAL | 0 refills | Status: DC

## 2017-12-24 ENCOUNTER — Inpatient Hospital Stay: Payer: Medicare Other | Attending: Hematology and Oncology

## 2017-12-24 DIAGNOSIS — C9001 Multiple myeloma in remission: Secondary | ICD-10-CM | POA: Diagnosis present

## 2017-12-24 DIAGNOSIS — Z452 Encounter for adjustment and management of vascular access device: Secondary | ICD-10-CM | POA: Insufficient documentation

## 2017-12-24 DIAGNOSIS — C61 Malignant neoplasm of prostate: Secondary | ICD-10-CM | POA: Diagnosis not present

## 2017-12-24 DIAGNOSIS — Z95828 Presence of other vascular implants and grafts: Secondary | ICD-10-CM

## 2017-12-24 MED ORDER — HEPARIN SOD (PORK) LOCK FLUSH 100 UNIT/ML IV SOLN
500.0000 [IU] | Freq: Once | INTRAVENOUS | Status: AC | PRN
  Administered 2017-12-24: 500 [IU] via INTRAVENOUS
  Filled 2017-12-24: qty 5

## 2017-12-24 MED ORDER — SODIUM CHLORIDE 0.9 % IJ SOLN
10.0000 mL | INTRAMUSCULAR | Status: DC | PRN
  Administered 2017-12-24: 10 mL via INTRAVENOUS
  Filled 2017-12-24: qty 10

## 2017-12-30 ENCOUNTER — Other Ambulatory Visit: Payer: Self-pay

## 2017-12-30 MED ORDER — LENALIDOMIDE 5 MG PO CAPS: ORAL_CAPSULE | ORAL | 0 refills | Status: DC

## 2018-01-17 ENCOUNTER — Ambulatory Visit (INDEPENDENT_AMBULATORY_CARE_PROVIDER_SITE_OTHER): Payer: Medicare Other | Admitting: Podiatry

## 2018-01-17 ENCOUNTER — Encounter: Payer: Self-pay | Admitting: Podiatry

## 2018-01-17 DIAGNOSIS — Q828 Other specified congenital malformations of skin: Secondary | ICD-10-CM | POA: Diagnosis not present

## 2018-01-17 DIAGNOSIS — M79676 Pain in unspecified toe(s): Secondary | ICD-10-CM

## 2018-01-17 DIAGNOSIS — B351 Tinea unguium: Secondary | ICD-10-CM

## 2018-01-18 NOTE — Progress Notes (Signed)
He presents today chief complaint of painful elongated toenails and multiple calluses.  Objective: Vital signs are stable he is alert and oriented x3.  Pulses are palpable.  Toenails are long thick yellow dystrophic onychomycotic painful palpation.  He has reactive hyper keratomas plantar aspect of the forefoot bilateral.  Porokeratotic lesions within these.  Assessment: Pain in limb secondary to porokeratosis and onychomycosis.  Plan: Debridement of all reactive hyperkeratotic tissue debridement of toenails 1 through 5 bilateral.

## 2018-01-31 ENCOUNTER — Other Ambulatory Visit: Payer: Self-pay | Admitting: *Deleted

## 2018-01-31 MED ORDER — LENALIDOMIDE 5 MG PO CAPS: ORAL_CAPSULE | ORAL | 0 refills | Status: DC

## 2018-02-04 ENCOUNTER — Telehealth: Payer: Self-pay | Admitting: Hematology and Oncology

## 2018-02-04 ENCOUNTER — Encounter: Payer: Self-pay | Admitting: Hematology and Oncology

## 2018-02-04 ENCOUNTER — Inpatient Hospital Stay (HOSPITAL_BASED_OUTPATIENT_CLINIC_OR_DEPARTMENT_OTHER): Payer: Medicare Other | Admitting: Hematology and Oncology

## 2018-02-04 ENCOUNTER — Inpatient Hospital Stay: Payer: Medicare Other

## 2018-02-04 ENCOUNTER — Inpatient Hospital Stay: Payer: Medicare Other | Attending: Hematology and Oncology

## 2018-02-04 VITALS — BP 119/62 | HR 60 | Temp 97.7°F | Resp 18 | Ht 73.0 in | Wt 190.3 lb

## 2018-02-04 DIAGNOSIS — Z95828 Presence of other vascular implants and grafts: Secondary | ICD-10-CM

## 2018-02-04 DIAGNOSIS — D649 Anemia, unspecified: Secondary | ICD-10-CM

## 2018-02-04 DIAGNOSIS — D638 Anemia in other chronic diseases classified elsewhere: Secondary | ICD-10-CM

## 2018-02-04 DIAGNOSIS — C9001 Multiple myeloma in remission: Secondary | ICD-10-CM | POA: Diagnosis present

## 2018-02-04 DIAGNOSIS — C61 Malignant neoplasm of prostate: Secondary | ICD-10-CM | POA: Diagnosis not present

## 2018-02-04 DIAGNOSIS — N183 Chronic kidney disease, stage 3 unspecified: Secondary | ICD-10-CM

## 2018-02-04 DIAGNOSIS — C9000 Multiple myeloma not having achieved remission: Secondary | ICD-10-CM

## 2018-02-04 LAB — COMPREHENSIVE METABOLIC PANEL
ALT: 18 U/L (ref 0–44)
AST: 21 U/L (ref 15–41)
Albumin: 3.5 g/dL (ref 3.5–5.0)
Alkaline Phosphatase: 79 U/L (ref 38–126)
Anion gap: 7 (ref 5–15)
BILIRUBIN TOTAL: 0.4 mg/dL (ref 0.3–1.2)
BUN: 28 mg/dL — AB (ref 8–23)
CHLORIDE: 104 mmol/L (ref 98–111)
CO2: 26 mmol/L (ref 22–32)
Calcium: 9.2 mg/dL (ref 8.9–10.3)
Creatinine, Ser: 1.5 mg/dL — ABNORMAL HIGH (ref 0.61–1.24)
GFR, EST AFRICAN AMERICAN: 52 mL/min — AB (ref >60)
GFR, EST NON AFRICAN AMERICAN: 45 mL/min — AB (ref >60)
Glucose, Bld: 79 mg/dL (ref 70–99)
POTASSIUM: 4.5 mmol/L (ref 3.5–5.1)
Sodium: 137 mmol/L (ref 135–145)
TOTAL PROTEIN: 6.5 g/dL (ref 6.5–8.1)

## 2018-02-04 LAB — CBC WITH DIFFERENTIAL/PLATELET
Basophils Absolute: 0.1 10*3/uL (ref 0.0–0.1)
Basophils Relative: 2 %
EOS PCT: 11 %
Eosinophils Absolute: 0.5 10*3/uL (ref 0.0–0.5)
HEMATOCRIT: 35.7 % — AB (ref 38.4–49.9)
Hemoglobin: 12.3 g/dL — ABNORMAL LOW (ref 13.0–17.1)
LYMPHS ABS: 1.8 10*3/uL (ref 0.9–3.3)
LYMPHS PCT: 37 %
MCH: 34.9 pg — AB (ref 27.2–33.4)
MCHC: 34.5 g/dL (ref 32.0–36.0)
MCV: 101.4 fL — AB (ref 79.3–98.0)
MONOS PCT: 16 %
Monocytes Absolute: 0.7 10*3/uL (ref 0.1–0.9)
NEUTROS ABS: 1.6 10*3/uL (ref 1.5–6.5)
Neutrophils Relative %: 34 %
PLATELETS: 170 10*3/uL (ref 140–400)
RBC: 3.52 MIL/uL — ABNORMAL LOW (ref 4.20–5.82)
RDW: 13.8 % (ref 11.0–14.6)
WBC: 4.7 10*3/uL (ref 4.0–10.3)

## 2018-02-04 MED ORDER — HEPARIN SOD (PORK) LOCK FLUSH 100 UNIT/ML IV SOLN
500.0000 [IU] | Freq: Once | INTRAVENOUS | Status: AC | PRN
  Administered 2018-02-04: 500 [IU] via INTRAVENOUS
  Filled 2018-02-04: qty 5

## 2018-02-04 MED ORDER — SODIUM CHLORIDE 0.9 % IJ SOLN
10.0000 mL | INTRAMUSCULAR | Status: DC | PRN
  Administered 2018-02-04: 10 mL via INTRAVENOUS
  Filled 2018-02-04: qty 10

## 2018-02-04 MED ORDER — SODIUM CHLORIDE 0.9 % IV SOLN
3.5000 mg | Freq: Once | INTRAVENOUS | Status: AC
  Administered 2018-02-04: 3.5 mg via INTRAVENOUS
  Filled 2018-02-04: qty 4.38

## 2018-02-04 NOTE — Assessment & Plan Note (Signed)
He has mild stable CKD I emphasized the importance of adequate fluid hydration We will resume Zometa with reduced dose, every 6 months

## 2018-02-04 NOTE — Assessment & Plan Note (Signed)
He is currently on low-dose Revlimid as a form of maintenance treatment In the meantime, he will continue aspirin, calcium with vitamin D He denies recent dental issues. He will continue Zometa every 6 months, reduced dose due to renal function

## 2018-02-04 NOTE — Assessment & Plan Note (Signed)
He is following up with urologist He appears to have responded well to hormonal therapy I would defer to them for further follow-up

## 2018-02-04 NOTE — Telephone Encounter (Signed)
Patient scheduled per NG 7/12

## 2018-02-04 NOTE — Patient Instructions (Signed)

## 2018-02-04 NOTE — Assessment & Plan Note (Signed)
This is likely anemia of chronic disease. The patient denies recent history of bleeding such as epistaxis, hematuria or hematochezia. He is asymptomatic from the anemia.

## 2018-02-04 NOTE — Progress Notes (Signed)
[place] OFFICE PROGRESS NOTE  Patient Care Team: Jinny Sanders, MD as PCP - General Jeanann Lewandowsky, MD as Consulting Physician (Hematology and Oncology) Cathlean Marseilles, NP as Nurse Practitioner (Hematology and Oncology) Raynelle Bring, MD as Consulting Physician (Urology) Heath Lark, MD as Consulting Physician (Hematology and Oncology) [place]lyn, Romilda Garret, [place] as Consulting Physician (Podiatry) Morton Stall, Howell Rucks, NP as Nurse Practitioner (Pain Medicine) Druscilla Brownie, MD as Consulting Physician (Dermatology) Bryson Ha, OD as Consulting Physician (Optometry)  ASSESSMENT & PLAN:  Multiple myeloma in remission [facility]) He is currently on low-dose Revlimid as a form of maintenance treatment In the meantime, he will continue aspirin, calcium with vitamin D He denies recent dental issues. He will continue Zometa every 6 months, reduced dose due to renal function  Malignant neoplasm of prostate ([place]) He is following up with urologist He appears to have responded well to hormonal therapy I would defer to them for further follow-up  Chronic kidney disease, stage III (moderate) He has mild stable CKD I emphasized the importance of adequate fluid hydration We will resume Zometa with reduced dose, every 6 months  Anemia of chronic disease This is likely anemia of chronic disease. The patient denies recent history of bleeding such as epistaxis, hematuria or hematochezia. He is asymptomatic from the anemia.    Orders Placed This Encounter  Procedures  . Comprehensive metabolic panel    Standing Status:   Future    Standing Expiration Date:   03/11/2019  . CBC with Differential/Platelet    Standing Status:   Future    Standing Expiration Date:   03/11/2019  . Kappa/lambda light chains    Standing Status:   Future    Standing Expiration Date:   03/11/2019  . Multiple Myeloma Panel (SPEP&IFE w/QIG)    Standing Status:   Future    Standing Expiration Date:    03/11/2019    INTERVAL HISTORY: Please see below for problem oriented charting. He returns for further follow-up He continues to have intermittent back pain Denies recent dental issues Appetite is stable He is trying to adequately hydrate himself According to the patient, he has responded well to hormonal treatment for prostate cancer He denies recent infection, fever or chills  SUMMARY OF ONCOLOGIC HISTORY:  1. Velcade, Cytoxan, and Decadron on a weekly basis from 07/10/2011 through 10/23/2011; 2. High-dose chemotherapy on 11/23/2011, consisting of carmustine 600 mg IV. He received cytarabine 400 mg IV on 12/24/2011, 12/25/2011, 12/26/2011, and 12/27/2011. He received etoposide 300 mg IV daily from 12/24/2011 through 12/27/2011, four doses. He received melphalan 280 mg IV on 12/28/2011.  3. 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  Current therapy: -Revlimid 5 mg daily, 3 weeks on, 1 week off. Revlimid was started on 05/11/2012.  Zometa was started on 09/02/2012.  Dental clearance was obtained.  Zometa 4 mg was placed on hold due to current osteomyelitis but restarted on 06/23/2013  REVIEW OF SYSTEMS:   Constitutional: Denies fevers, chills or abnormal weight loss Eyes: Denies blurriness of vision Ears, nose, mouth, throat, and face: Denies mucositis or sore throat Respiratory: Denies cough, dyspnea or wheezes Cardiovascular: Denies palpitation, chest discomfort or lower extremity swelling Gastrointestinal:  Denies nausea, heartburn or change in bowel habits Skin: Denies abnormal skin rashes Lymphatics: Denies new lymphadenopathy or easy bruising Neurological:Denies numbness, tingling or new weaknesses Behavioral/Psych: Mood is stable, no new changes  All other systems were reviewed with the patient and are negative.  I have reviewed  the past medical history, past surgical history, social history and family history with the patient and they are unchanged from  previous note.  ALLERGIES:  is allergic to celebrex [celecoxib]; cymbalta [duloxetine hcl]; hydromorphone hcl; meloxicam; oxycontin [oxycodone]; percocet [oxycodone-acetaminophen]; vicodin [hydrocodone-acetaminophen]; and lyrica [pregabalin].  MEDICATIONS:  Current Outpatient Medications  Medication Sig Dispense Refill  . aspirin 81 MG tablet Take 81 mg by mouth daily.    . B Complex Vitamins (VITAMIN-B COMPLEX) TABS Take by mouth.    . Calcium Carbonate-Vitamin D 600-400 MG-UNIT per tablet Take by mouth.    . cholecalciferol (VITAMIN D) 1000 UNITS tablet Take 1,000 Units by mouth daily.    Marland Kitchen lenalidomide (REVLIMID) 5 MG capsule Take one capsule by mouth daily for 21 days, then off 7 days. 21 capsule 0  . lidocaine-prilocaine (EMLA) cream Apply topically as needed. Apply to port site one hour before treatment and cover with plastic wrap 30 g 2  . NONFORMULARY OR COMPOUNDED ITEM Baclofn/amitrp/ketamn cream topically to feet TID prn for neuropathy    . oxyCODONE (OXY IR/ROXICODONE) 5 MG immediate release tablet TK 1 T PO Q 4 H PRN P  0  . pantoprazole (PROTONIX) 40 MG tablet Take 40 mg by mouth daily.  3  . venlafaxine (EFFEXOR) 25 MG tablet Take by mouth.     Current Facility-Administered Medications  Medication Dose Route Frequency Provider Last Rate Last Dose  . 0.9 %  sodium chloride infusion  500 mL Intravenous Continuous Nandigam, Venia Minks, MD       Facility-Administered Medications Ordered in Other Visits  Medication Dose Route Frequency Provider Last Rate Last Dose  . heparin lock flush 100 unit/mL  500 Units Intravenous Once PRN Alvy Bimler, Belvin Gauss, MD      . zolendronic acid (ZOMETA) 3.5 mg in sodium chloride 0.9 % 100 mL IVPB  3.5 mg Intravenous Once Alvy Bimler, Nitara Szczerba, MD        PHYSICAL EXAMINATION: ECOG PERFORMANCE STATUS: 1 - Symptomatic but completely ambulatory  Vitals:   02/04/18 1407  BP: 119/62  Pulse: 60  Resp: 18  Temp: 97.7 F (36.5 C)  SpO2: 100%   Filed Weights    02/04/18 1407  Weight: 190 lb 4.8 oz (86.3 kg)    GENERAL:alert, no distress and comfortable SKIN: skin color, texture, turgor are normal, no rashes or significant lesions EYES: normal, Conjunctiva are pink and non-injected, sclera clear OROPHARYNX:no exudate, no erythema and lips, buccal mucosa, and tongue normal  NECK: supple, thyroid normal size, non-tender, without nodularity LYMPH:  no palpable lymphadenopathy in the cervical, axillary or inguinal LUNGS: clear to auscultation and percussion with normal breathing effort HEART: regular rate & rhythm and no murmurs and no lower extremity edema ABDOMEN:abdomen soft, non-tender and normal bowel sounds Musculoskeletal:no cyanosis of digits and no clubbing  NEURO: alert & oriented x 3 with fluent speech, no focal motor/sensory deficits  LABORATORY DATA:  I have reviewed the data as listed    Component Value Date/Time   NA 137 02/04/2018 1251   NA 139 05/14/2017 1145   K 4.5 02/04/2018 1251   K 3.8 05/14/2017 1145   CL 104 02/04/2018 1251   CL 108 (H) 09/30/2013 0945   CL 110 (H) 12/29/2012 1151   CO2 26 02/04/2018 1251   CO2 27 05/14/2017 1145   GLUCOSE 79 02/04/2018 1251   GLUCOSE 109 05/14/2017 1145   GLUCOSE 127 (H) 12/29/2012 1151   BUN 28 (H) 02/04/2018 1251   BUN 17.9 05/14/2017 1145  CREATININE 1.50 (H) 02/04/2018 1251   CREATININE 1.3 05/14/2017 1145   CALCIUM 9.2 02/04/2018 1251   CALCIUM 9.3 05/14/2017 1145   PROT 6.5 02/04/2018 1251   PROT 6.2 05/14/2017 1145   PROT 6.6 05/14/2017 1145   ALBUMIN 3.5 02/04/2018 1251   ALBUMIN 3.3 (L) 05/14/2017 1145   AST 21 02/04/2018 1251   AST 19 05/14/2017 1145   ALT 18 02/04/2018 1251   ALT 18 05/14/2017 1145   ALKPHOS 79 02/04/2018 1251   ALKPHOS 45 05/14/2017 1145   BILITOT 0.4 02/04/2018 1251   BILITOT 0.67 05/14/2017 1145   GFRNONAA 45 (L) 02/04/2018 1251   GFRNONAA >60 09/30/2013 0945   GFRAA 52 (L) 02/04/2018 1251   GFRAA >60 09/30/2013 0945    No results  found for: SPEP, UPEP  Lab Results  Component Value Date   WBC 4.7 02/04/2018   NEUTROABS 1.6 02/04/2018   HGB 12.3 (L) 02/04/2018   HCT 35.7 (L) 02/04/2018   MCV 101.4 (H) 02/04/2018   PLT 170 02/04/2018      Chemistry      Component Value Date/Time   NA 137 02/04/2018 1251   NA 139 05/14/2017 1145   K 4.5 02/04/2018 1251   K 3.8 05/14/2017 1145   CL 104 02/04/2018 1251   CL 108 (H) 09/30/2013 0945   CL 110 (H) 12/29/2012 1151   CO2 26 02/04/2018 1251   CO2 27 05/14/2017 1145   BUN 28 (H) 02/04/2018 1251   BUN 17.9 05/14/2017 1145   CREATININE 1.50 (H) 02/04/2018 1251   CREATININE 1.3 05/14/2017 1145      Component Value Date/Time   CALCIUM 9.2 02/04/2018 1251   CALCIUM 9.3 05/14/2017 1145   ALKPHOS 79 02/04/2018 1251   ALKPHOS 45 05/14/2017 1145   AST 21 02/04/2018 1251   AST 19 05/14/2017 1145   ALT 18 02/04/2018 1251   ALT 18 05/14/2017 1145   BILITOT 0.4 02/04/2018 1251   BILITOT 0.67 05/14/2017 1145       All questions were answered. The patient knows to call the clinic with any problems, questions or concerns. No barriers to learning was detected.  I spent 15 minutes counseling the patient face to face. The total time spent in the appointment was 20 minutes and more than 50% was on counseling and review of test results  Heath Lark, MD 02/04/2018 2:23 PM

## 2018-02-04 NOTE — Telephone Encounter (Signed)
Gave patient avs and calendar of upcoming appts.  °

## 2018-02-05 ENCOUNTER — Other Ambulatory Visit: Payer: Self-pay | Admitting: Hematology and Oncology

## 2018-02-07 LAB — MULTIPLE MYELOMA PANEL, SERUM
ALBUMIN/GLOB SERPL: 1.1 (ref 0.7–1.7)
ALPHA 1: 0.2 g/dL (ref 0.0–0.4)
ALPHA2 GLOB SERPL ELPH-MCNC: 0.6 g/dL (ref 0.4–1.0)
Albumin SerPl Elph-Mcnc: 3.1 g/dL (ref 2.9–4.4)
B-GLOBULIN SERPL ELPH-MCNC: 0.8 g/dL (ref 0.7–1.3)
GAMMA GLOB SERPL ELPH-MCNC: 1.3 g/dL (ref 0.4–1.8)
GLOBULIN, TOTAL: 3 g/dL (ref 2.2–3.9)
IGG (IMMUNOGLOBIN G), SERUM: 1173 mg/dL (ref 700–1600)
IgA: 291 mg/dL (ref 61–437)
IgM (Immunoglobulin M), Srm: 14 mg/dL — ABNORMAL LOW (ref 15–143)
Total Protein ELP: 6.1 g/dL (ref 6.0–8.5)

## 2018-02-07 LAB — KAPPA/LAMBDA LIGHT CHAINS
KAPPA FREE LGHT CHN: 62.5 mg/L — AB (ref 3.3–19.4)
Kappa, lambda light chain ratio: 1.47 (ref 0.26–1.65)
LAMDA FREE LIGHT CHAINS: 42.4 mg/L — AB (ref 5.7–26.3)

## 2018-02-21 ENCOUNTER — Other Ambulatory Visit: Payer: Self-pay

## 2018-02-21 MED ORDER — LENALIDOMIDE 5 MG PO CAPS: ORAL_CAPSULE | ORAL | 0 refills | Status: DC

## 2018-02-22 ENCOUNTER — Encounter: Payer: Self-pay | Admitting: Hematology and Oncology

## 2018-02-22 NOTE — Progress Notes (Signed)
Received message from patient regarding form for [employer] copay relied.  Called patient back whom states [employer] have not received proof of his diagnosis and he is in jeopardy of losing his assistance for Revlimid(oral chemo). This should have been forwarded to the oral chemo clinic but I reviewed chart and could not find a note of whom applied for this copay assistance on his behalf. Called physician and asked if she completed form and she states not that she can recall.  Called patient to advise of receipt of his message and asked whom applied on his behalf and he states someone in pharmacy. Advised him to call the number on the letter from [employer] and to have them fax form to me to deliver to physician for signature. Gave him my contact information.He verbalized understanding.  He returned my call and states [facility] applied on his behalf to [employer] and it has all been taken care of. He thanked me for responding.

## 2018-03-18 ENCOUNTER — Inpatient Hospital Stay: Payer: Medicare Other | Attending: Hematology and Oncology

## 2018-03-18 DIAGNOSIS — Z452 Encounter for adjustment and management of vascular access device: Secondary | ICD-10-CM | POA: Insufficient documentation

## 2018-03-18 DIAGNOSIS — Z95828 Presence of other vascular implants and grafts: Secondary | ICD-10-CM

## 2018-03-18 DIAGNOSIS — C9001 Multiple myeloma in remission: Secondary | ICD-10-CM | POA: Diagnosis present

## 2018-03-18 DIAGNOSIS — C9000 Multiple myeloma not having achieved remission: Secondary | ICD-10-CM

## 2018-03-18 MED ORDER — SODIUM CHLORIDE 0.9 % IJ SOLN
3.0000 mL | Freq: Once | INTRAMUSCULAR | Status: DC | PRN
  Filled 2018-03-18: qty 10

## 2018-03-18 MED ORDER — HEPARIN SOD (PORK) LOCK FLUSH 100 UNIT/ML IV SOLN
500.0000 [IU] | Freq: Once | INTRAVENOUS | Status: AC | PRN
  Administered 2018-03-18: 500 [IU] via INTRAVENOUS
  Filled 2018-03-18: qty 5

## 2018-03-18 MED ORDER — SODIUM CHLORIDE 0.9 % IJ SOLN
10.0000 mL | INTRAMUSCULAR | Status: DC | PRN
  Administered 2018-03-18: 10 mL via INTRAVENOUS
  Filled 2018-03-18: qty 10

## 2018-03-18 MED ORDER — ALTEPLASE 2 MG IJ SOLR
2.0000 mg | Freq: Once | INTRAMUSCULAR | Status: DC | PRN
  Filled 2018-03-18: qty 2

## 2018-03-18 MED ORDER — HEPARIN SOD (PORK) LOCK FLUSH 100 UNIT/ML IV SOLN
250.0000 [IU] | Freq: Once | INTRAVENOUS | Status: DC | PRN
  Filled 2018-03-18: qty 5

## 2018-03-18 NOTE — Patient Instructions (Signed)

## 2018-03-25 ENCOUNTER — Other Ambulatory Visit: Payer: Self-pay | Admitting: *Deleted

## 2018-03-25 MED ORDER — LENALIDOMIDE 5 MG PO CAPS: ORAL_CAPSULE | ORAL | 0 refills | Status: DC

## 2018-04-18 ENCOUNTER — Ambulatory Visit (INDEPENDENT_AMBULATORY_CARE_PROVIDER_SITE_OTHER): Payer: Medicare Other | Admitting: Podiatry

## 2018-04-18 ENCOUNTER — Encounter: Payer: Self-pay | Admitting: Podiatry

## 2018-04-18 DIAGNOSIS — M79676 Pain in unspecified toe(s): Secondary | ICD-10-CM | POA: Diagnosis not present

## 2018-04-18 DIAGNOSIS — B351 Tinea unguium: Secondary | ICD-10-CM | POA: Diagnosis not present

## 2018-04-18 DIAGNOSIS — Q828 Other specified congenital malformations of skin: Secondary | ICD-10-CM

## 2018-04-18 NOTE — Progress Notes (Signed)
Complaint:  Visit Type: Patient returns to my office for continued preventative foot care services. 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  Patient has amputation second toe left foot.. The patient presents for preventative foot care services. No changes to ROS  Podiatric Exam: Vascular: dorsalis pedis and posterior tibial pulses are palpable bilateral. Capillary return is immediate. Temperature gradient is WNL. Skin turgor WNL  Sensorium: Diminished  Semmes Weinstein monofilament test. Diminished  tactile sensation bilaterally. Nail Exam: Pt has thick disfigured discolored nails with subungual debris noted bilateral entire nail hallux through fifth toenails Ulcer Exam: There is no evidence of ulcer or pre-ulcerative changes or infection. Orthopedic Exam: Muscle tone and strength are WNL. No limitations in general ROM. No crepitus or effusions noted. Foot type and digits show no abnormalities. Amputation second toe left foot.  Plantar flex second metatarsal left foot.  HAV  B/L. Skin: No Porokeratosis. No infection or ulcers.  Porokeratosis sub 2 left foot and sub 5th metabase left foot.  Diagnosis:  Onychomycosis, , Pain in right toe, pain in left toes  Treatment & Plan Procedures and Treatment: Consent by patient was obtained for treatment procedures.   Debridement of mycotic and hypertrophic toenails, 1 through 5 bilateral and clearing of subungual debris. No ulceration, no infection noted. Prescribed diabetic insoles for his porokeratotic lesions left foot. Return Visit-Office Procedure: Patient instructed to return to the office for a follow up visit 3 months for continued evaluation and treatment.    Gardiner Barefoot DPM

## 2018-04-26 ENCOUNTER — Other Ambulatory Visit: Payer: Self-pay

## 2018-04-26 MED ORDER — LENALIDOMIDE 5 MG PO CAPS: ORAL_CAPSULE | ORAL | 0 refills | Status: DC

## 2018-04-29 ENCOUNTER — Inpatient Hospital Stay (HOSPITAL_BASED_OUTPATIENT_CLINIC_OR_DEPARTMENT_OTHER): Payer: Medicare Other | Admitting: Hematology and Oncology

## 2018-04-29 ENCOUNTER — Telehealth: Payer: Self-pay | Admitting: Hematology and Oncology

## 2018-04-29 ENCOUNTER — Inpatient Hospital Stay: Payer: Medicare Other

## 2018-04-29 ENCOUNTER — Encounter: Payer: Self-pay | Admitting: Hematology and Oncology

## 2018-04-29 ENCOUNTER — Inpatient Hospital Stay: Payer: Medicare Other | Attending: Hematology and Oncology

## 2018-04-29 DIAGNOSIS — C9001 Multiple myeloma in remission: Secondary | ICD-10-CM

## 2018-04-29 DIAGNOSIS — N183 Chronic kidney disease, stage 3 unspecified: Secondary | ICD-10-CM

## 2018-04-29 DIAGNOSIS — C61 Malignant neoplasm of prostate: Secondary | ICD-10-CM | POA: Diagnosis not present

## 2018-04-29 DIAGNOSIS — Z95828 Presence of other vascular implants and grafts: Secondary | ICD-10-CM

## 2018-04-29 LAB — CBC WITH DIFFERENTIAL/PLATELET
BASOS ABS: 0.1 10*3/uL (ref 0.0–0.1)
Basophils Relative: 3 %
EOS ABS: 0.4 10*3/uL (ref 0.0–0.5)
EOS PCT: 10 %
HCT: 39.1 % (ref 38.4–49.9)
Hemoglobin: 13.3 g/dL (ref 13.0–17.1)
LYMPHS PCT: 27 %
Lymphs Abs: 1.1 10*3/uL (ref 0.9–3.3)
MCH: 34.9 pg — AB (ref 27.2–33.4)
MCHC: 33.9 g/dL (ref 32.0–36.0)
MCV: 102.9 fL — AB (ref 79.3–98.0)
MONO ABS: 0.6 10*3/uL (ref 0.1–0.9)
Monocytes Relative: 14 %
Neutro Abs: 1.9 10*3/uL (ref 1.5–6.5)
Neutrophils Relative %: 46 %
Platelets: 154 10*3/uL (ref 140–400)
RBC: 3.8 MIL/uL — ABNORMAL LOW (ref 4.20–5.82)
RDW: 14.5 % (ref 11.0–14.6)
WBC: 4.2 10*3/uL (ref 4.0–10.3)

## 2018-04-29 LAB — COMPREHENSIVE METABOLIC PANEL
ALBUMIN: 3.3 g/dL — AB (ref 3.5–5.0)
ALK PHOS: 64 U/L (ref 38–126)
ALT: 20 U/L (ref 0–44)
AST: 23 U/L (ref 15–41)
Anion gap: 8 (ref 5–15)
BILIRUBIN TOTAL: 0.7 mg/dL (ref 0.3–1.2)
BUN: 24 mg/dL — AB (ref 8–23)
CO2: 26 mmol/L (ref 22–32)
CREATININE: 1.46 mg/dL — AB (ref 0.61–1.24)
Calcium: 9.3 mg/dL (ref 8.9–10.3)
Chloride: 105 mmol/L (ref 98–111)
GFR calc Af Amer: 54 mL/min — ABNORMAL LOW (ref >60)
GFR calc non Af Amer: 46 mL/min — ABNORMAL LOW (ref >60)
GLUCOSE: 103 mg/dL — AB (ref 70–99)
POTASSIUM: 4.3 mmol/L (ref 3.5–5.1)
Sodium: 139 mmol/L (ref 135–145)
TOTAL PROTEIN: 6.8 g/dL (ref 6.5–8.1)

## 2018-04-29 MED ORDER — HEPARIN SOD (PORK) LOCK FLUSH 100 UNIT/ML IV SOLN
500.0000 [IU] | Freq: Once | INTRAVENOUS | Status: AC | PRN
  Administered 2018-04-29: 500 [IU] via INTRAVENOUS
  Filled 2018-04-29: qty 5

## 2018-04-29 MED ORDER — SODIUM CHLORIDE 0.9 % IJ SOLN
10.0000 mL | INTRAMUSCULAR | Status: DC | PRN
  Administered 2018-04-29: 10 mL via INTRAVENOUS
  Filled 2018-04-29: qty 10

## 2018-04-29 NOTE — Assessment & Plan Note (Signed)
He is being followed by urologist According to the patient, his recent PSA was elevated I would defer to them for further management For now, he is not symptomatic

## 2018-04-29 NOTE — Progress Notes (Signed)
[place] OFFICE PROGRESS NOTE  Patient Care Team: Jinny Sanders, MD as PCP - General Jeanann Lewandowsky, MD as Consulting Physician (Hematology and Oncology) Cathlean Marseilles, NP as Nurse Practitioner (Hematology and Oncology) Raynelle Bring, MD as Consulting Physician (Urology) Heath Lark, MD as Consulting Physician (Hematology and Oncology) [place], Romilda Garret, [place] as Consulting Physician (Podiatry) Morton Stall, Howell Rucks, NP as Nurse Practitioner (Pain Medicine) Druscilla Brownie, MD as Consulting Physician (Dermatology) Bryson Ha, OD as Consulting Physician (Optometry)  ASSESSMENT & PLAN:  Multiple myeloma in remission [name]) He is currently on low-dose Revlimid as a form of maintenance treatment In the meantime, he will continue aspirin, calcium with vitamin D He denies recent dental issues. He will continue Zometa every 6 months, reduced dose due to renal function  Malignant neoplasm of prostate [facility]) He is being followed by urologist According to the patient, his recent PSA was elevated I would defer to them for further management For now, he is not symptomatic  Chronic kidney disease, stage III (moderate) He has mild stable CKD I emphasized the importance of adequate fluid hydration We will resume Zometa with reduced dose, every 6 months   Orders Placed This Encounter  Procedures  . CBC with Differential/Platelet    Standing Status:   Future    Standing Expiration Date:   06/03/2019  . Comprehensive metabolic panel    Standing Status:   Future    Standing Expiration Date:   06/03/2019  . Kappa/lambda light chains    Standing Status:   Future    Standing Expiration Date:   06/03/2019  . Multiple Myeloma Panel (SPEP&IFE w/QIG)    Standing Status:   Future    Standing Expiration Date:   06/03/2019    INTERVAL HISTORY: Please see below for problem oriented charting. He returns for further follow-up He had recent flu shot He was told by urologist that  his PSA is elevated He denies significant urinary symptoms His chronic pain is stable He is following up with dentist regularly for chronic dental issues.  He is not slated for any dental extraction  SUMMARY OF ONCOLOGIC HISTORY:  1. Velcade, Cytoxan, and Decadron on a weekly basis from 07/10/2011 through 10/23/2011; 2. High-dose chemotherapy on 11/23/2011, consisting of carmustine 600 mg IV. He received cytarabine 400 mg IV on 12/24/2011, 12/25/2011, 12/26/2011, and 12/27/2011. He received etoposide 300 mg IV daily from 12/24/2011 through 12/27/2011, four doses. He received melphalan 280 mg IV on 12/28/2011.  3. 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  Current therapy: -Revlimid 5 mg daily, 3 weeks on, 1 week off. Revlimid was started on 05/11/2012.  Zometa was started on 09/02/2012.  Dental clearance was obtained.  Zometa 4 mg was placed on hold due to current osteomyelitis but restarted on 06/23/2013  REVIEW OF SYSTEMS:   Constitutional: Denies fevers, chills or abnormal weight loss Eyes: Denies blurriness of vision Ears, nose, mouth, throat, and face: Denies mucositis or sore throat Respiratory: Denies cough, dyspnea or wheezes Cardiovascular: Denies palpitation, chest discomfort or lower extremity swelling Gastrointestinal:  Denies nausea, heartburn or change in bowel habits Skin: Denies abnormal skin rashes Lymphatics: Denies new lymphadenopathy or easy bruising Neurological:Denies numbness, tingling or new weaknesses Behavioral/Psych: Mood is stable, no new changes  All other systems were reviewed with the patient and are negative.  I have reviewed the past medical history, past surgical history, social history and family history with the patient and they are unchanged from previous note.  ALLERGIES:  is allergic to celebrex [celecoxib]; cymbalta [duloxetine hcl]; hydromorphone hcl; meloxicam; oxycontin [oxycodone]; percocet [oxycodone-acetaminophen]; vicodin  [hydrocodone-acetaminophen]; and lyrica [pregabalin].  MEDICATIONS:  Current Outpatient Medications  Medication Sig Dispense Refill  . aspirin 81 MG tablet Take 81 mg by mouth daily.    . B Complex Vitamins (VITAMIN-B COMPLEX) TABS Take by mouth.    . Calcium Carbonate-Vitamin D 600-400 MG-UNIT per tablet Take by mouth.    . cholecalciferol (VITAMIN D) 1000 UNITS tablet Take 1,000 Units by mouth daily.    Marland Kitchen lenalidomide (REVLIMID) 5 MG capsule Take one capsule by mouth daily for 21 days, then off 7 days. 21 capsule 0  . lidocaine-prilocaine (EMLA) cream Apply topically as needed. Apply to port site one hour before treatment and cover with plastic wrap 30 g 2  . NONFORMULARY OR COMPOUNDED ITEM Baclofn/amitrp/ketamn cream topically to feet TID prn for neuropathy    . oxyCODONE (OXY IR/ROXICODONE) 5 MG immediate release tablet TK 1 T PO Q 4 H PRN P  0  . pantoprazole (PROTONIX) 40 MG tablet Take 40 mg by mouth daily.  3  . pantoprazole (PROTONIX) 40 MG tablet TAKE 1 TABLET BY MOUTH EVERY DAY 90 tablet 3  . venlafaxine (EFFEXOR) 25 MG tablet Take by mouth.     Current Facility-Administered Medications  Medication Dose Route Frequency Provider Last Rate Last Dose  . 0.9 %  sodium chloride infusion  500 mL Intravenous Continuous Nandigam, Kavitha V, MD        PHYSICAL EXAMINATION: ECOG PERFORMANCE STATUS: 1 - Symptomatic but completely ambulatory  Vitals:   04/29/18 1326  BP: 119/68  Pulse: 63  Resp: 18  Temp: 97.6 F (36.4 C)  SpO2: 100%   Filed Weights   04/29/18 1326  Weight: 191 lb 3.2 oz (86.7 kg)    GENERAL:alert, no distress and comfortable SKIN: skin color, texture, turgor are normal, no rashes or significant lesions EYES: normal, Conjunctiva are pink and non-injected, sclera clear OROPHARYNX:no exudate, no erythema and lips, buccal mucosa, and tongue normal  NECK: supple, thyroid normal size, non-tender, without nodularity LYMPH:  no palpable lymphadenopathy in the  cervical, axillary or inguinal LUNGS: clear to auscultation and percussion with normal breathing effort HEART: regular rate & rhythm and no murmurs and no lower extremity edema ABDOMEN:abdomen soft, non-tender and normal bowel sounds Musculoskeletal:no cyanosis of digits and no clubbing  NEURO: alert & oriented x 3 with fluent speech, no focal motor/sensory deficits  LABORATORY DATA:  I have reviewed the data as listed    Component Value Date/Time   NA 139 04/29/2018 1237   NA 139 05/14/2017 1145   K 4.3 04/29/2018 1237   K 3.8 05/14/2017 1145   CL 105 04/29/2018 1237   CL 108 (H) 09/30/2013 0945   CL 110 (H) 12/29/2012 1151   CO2 26 04/29/2018 1237   CO2 27 05/14/2017 1145   GLUCOSE 103 (H) 04/29/2018 1237   GLUCOSE 109 05/14/2017 1145   GLUCOSE 127 (H) 12/29/2012 1151   BUN 24 (H) 04/29/2018 1237   BUN 17.9 05/14/2017 1145   CREATININE 1.46 (H) 04/29/2018 1237   CREATININE 1.3 05/14/2017 1145   CALCIUM 9.3 04/29/2018 1237   CALCIUM 9.3 05/14/2017 1145   PROT 6.8 04/29/2018 1237   PROT 6.2 05/14/2017 1145   PROT 6.6 05/14/2017 1145   ALBUMIN 3.3 (L) 04/29/2018 1237   ALBUMIN 3.3 (L) 05/14/2017 1145   AST 23 04/29/2018 1237   AST 19 05/14/2017 1145  ALT 20 04/29/2018 1237   ALT 18 05/14/2017 1145   ALKPHOS 64 04/29/2018 1237   ALKPHOS 45 05/14/2017 1145   BILITOT 0.7 04/29/2018 1237   BILITOT 0.67 05/14/2017 1145   GFRNONAA 46 (L) 04/29/2018 1237   GFRNONAA >60 09/30/2013 0945   GFRAA 54 (L) 04/29/2018 1237   GFRAA >60 09/30/2013 0945    No results found for: SPEP, UPEP  Lab Results  Component Value Date   WBC 4.2 04/29/2018   NEUTROABS 1.9 04/29/2018   HGB 13.3 04/29/2018   HCT 39.1 04/29/2018   MCV 102.9 (H) 04/29/2018   PLT 154 04/29/2018      Chemistry      Component Value Date/Time   NA 139 04/29/2018 1237   NA 139 05/14/2017 1145   K 4.3 04/29/2018 1237   K 3.8 05/14/2017 1145   CL 105 04/29/2018 1237   CL 108 (H) 09/30/2013 0945   CL 110  (H) 12/29/2012 1151   CO2 26 04/29/2018 1237   CO2 27 05/14/2017 1145   BUN 24 (H) 04/29/2018 1237   BUN 17.9 05/14/2017 1145   CREATININE 1.46 (H) 04/29/2018 1237   CREATININE 1.3 05/14/2017 1145      Component Value Date/Time   CALCIUM 9.3 04/29/2018 1237   CALCIUM 9.3 05/14/2017 1145   ALKPHOS 64 04/29/2018 1237   ALKPHOS 45 05/14/2017 1145   AST 23 04/29/2018 1237   AST 19 05/14/2017 1145   ALT 20 04/29/2018 1237   ALT 18 05/14/2017 1145   BILITOT 0.7 04/29/2018 1237   BILITOT 0.67 05/14/2017 1145      All questions were answered. The patient knows to call the clinic with any problems, questions or concerns. No barriers to learning was detected.  I spent 15 minutes counseling the patient face to face. The total time spent in the appointment was 20 minutes and more than 50% was on counseling and review of test results  Heath Lark, MD 04/29/2018 2:42 PM

## 2018-04-29 NOTE — Telephone Encounter (Signed)
Gave pt avs and calendar  °

## 2018-04-29 NOTE — Assessment & Plan Note (Signed)
He is currently on low-dose Revlimid as a form of maintenance treatment In the meantime, he will continue aspirin, calcium with vitamin D He denies recent dental issues. He will continue Zometa every 6 months, reduced dose due to renal function

## 2018-04-29 NOTE — Assessment & Plan Note (Signed)
He has mild stable CKD I emphasized the importance of adequate fluid hydration We will resume Zometa with reduced dose, every 6 months

## 2018-05-02 ENCOUNTER — Telehealth: Payer: Self-pay | Admitting: Hematology and Oncology

## 2018-05-02 LAB — MULTIPLE MYELOMA PANEL, SERUM
ALBUMIN/GLOB SERPL: 1.1 (ref 0.7–1.7)
Albumin SerPl Elph-Mcnc: 3.3 g/dL (ref 2.9–4.4)
Alpha 1: 0.2 g/dL (ref 0.0–0.4)
Alpha2 Glob SerPl Elph-Mcnc: 0.6 g/dL (ref 0.4–1.0)
B-GLOBULIN SERPL ELPH-MCNC: 0.9 g/dL (ref 0.7–1.3)
GAMMA GLOB SERPL ELPH-MCNC: 1.3 g/dL (ref 0.4–1.8)
GLOBULIN, TOTAL: 3.1 g/dL (ref 2.2–3.9)
IGM (IMMUNOGLOBULIN M), SRM: 89 mg/dL (ref 15–143)
IgA: 343 mg/dL (ref 61–437)
IgG (Immunoglobin G), Serum: 1241 mg/dL (ref 700–1600)
Total Protein ELP: 6.4 g/dL (ref 6.0–8.5)

## 2018-05-02 LAB — KAPPA/LAMBDA LIGHT CHAINS
Kappa free light chain: 67.4 mg/L — ABNORMAL HIGH (ref 3.3–19.4)
Kappa, lambda light chain ratio: 1.51 (ref 0.26–1.65)
Lambda free light chains: 44.5 mg/L — ABNORMAL HIGH (ref 5.7–26.3)

## 2018-05-02 NOTE — Telephone Encounter (Signed)
Tried to call regarding 11/22 I did leave a message

## 2018-05-03 ENCOUNTER — Telehealth: Payer: Self-pay

## 2018-05-03 NOTE — Telephone Encounter (Signed)
Called and left below message. Instructed to call the office for questions.

## 2018-05-03 NOTE — Telephone Encounter (Signed)
-----   Message from Heath Lark, MD sent at 05/02/2018  3:06 PM EDT ----- Regarding: myeloma panel is stable pls let him know the results ----- Message ----- From: Interface, Lab In [place] Sent: 04/29/2018   1:13 PM EDT To: Heath Lark, MD

## 2018-05-10 ENCOUNTER — Telehealth: Payer: Self-pay

## 2018-05-10 NOTE — Telephone Encounter (Signed)
He called and left a message. He forgot to ask at last appt. He had a transplant 2013. Is it okay to get the shingles vaccine?

## 2018-05-10 NOTE — Telephone Encounter (Signed)
Called and left below message and ask him to call office for questions.

## 2018-05-10 NOTE — Telephone Encounter (Signed)
The only type allowed is Shingrix

## 2018-05-19 ENCOUNTER — Other Ambulatory Visit: Payer: Self-pay

## 2018-05-19 MED ORDER — LENALIDOMIDE 5 MG PO CAPS: ORAL_CAPSULE | ORAL | 0 refills | Status: DC

## 2018-06-17 ENCOUNTER — Inpatient Hospital Stay: Payer: Medicare Other | Attending: Hematology and Oncology

## 2018-06-17 DIAGNOSIS — C9001 Multiple myeloma in remission: Secondary | ICD-10-CM | POA: Insufficient documentation

## 2018-06-17 DIAGNOSIS — Z452 Encounter for adjustment and management of vascular access device: Secondary | ICD-10-CM | POA: Insufficient documentation

## 2018-06-17 DIAGNOSIS — Z95828 Presence of other vascular implants and grafts: Secondary | ICD-10-CM

## 2018-06-17 MED ORDER — HEPARIN SOD (PORK) LOCK FLUSH 100 UNIT/ML IV SOLN
500.0000 [IU] | Freq: Once | INTRAVENOUS | Status: AC | PRN
  Administered 2018-06-17: 500 [IU] via INTRAVENOUS
  Filled 2018-06-17: qty 5

## 2018-06-20 ENCOUNTER — Other Ambulatory Visit: Payer: Self-pay | Admitting: *Deleted

## 2018-06-20 MED ORDER — LENALIDOMIDE 5 MG PO CAPS: ORAL_CAPSULE | ORAL | 0 refills | Status: DC

## 2018-07-15 ENCOUNTER — Other Ambulatory Visit: Payer: Self-pay | Admitting: *Deleted

## 2018-07-15 MED ORDER — LENALIDOMIDE 5 MG PO CAPS: ORAL_CAPSULE | ORAL | 0 refills | Status: DC

## 2018-07-18 ENCOUNTER — Ambulatory Visit (INDEPENDENT_AMBULATORY_CARE_PROVIDER_SITE_OTHER): Payer: Medicare Other | Admitting: Podiatry

## 2018-07-18 ENCOUNTER — Encounter: Payer: Self-pay | Admitting: Podiatry

## 2018-07-18 DIAGNOSIS — M79676 Pain in unspecified toe(s): Secondary | ICD-10-CM | POA: Diagnosis not present

## 2018-07-18 DIAGNOSIS — B351 Tinea unguium: Secondary | ICD-10-CM

## 2018-07-18 NOTE — Progress Notes (Signed)
Complaint:  Visit Type: Patient returns to my office for continued preventative foot care services. 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  Patient has amputation second toe left foot.. The patient presents for preventative foot care services. No changes to ROS  Podiatric Exam: Vascular: dorsalis pedis and posterior tibial pulses are palpable bilateral. Capillary return is immediate. Temperature gradient is WNL. Skin turgor WNL  Sensorium: Diminished  Semmes Weinstein monofilament test. Diminished  tactile sensation bilaterally. Nail Exam: Pt has thick disfigured discolored nails with subungual debris noted bilateral entire nail hallux through fifth toenails Ulcer Exam: There is no evidence of ulcer or pre-ulcerative changes or infection. Orthopedic Exam: Muscle tone and strength are WNL. No limitations in general ROM. No crepitus or effusions noted. Foot type and digits show no abnormalities. Amputation second toe left foot.  Plantar flex second metatarsal left foot.  HAV  B/L. Skin: No Porokeratosis. No infection or ulcers.  Porokeratosis sub 2 left foot and sub 5th metabase left foot asymptomatic.  Diagnosis:  Onychomycosis, , Pain in right toe, pain in left toes  Treatment & Plan Procedures and Treatment: Consent by patient was obtained for treatment procedures.   Debridement of mycotic and hypertrophic toenails, 1 through 5 bilateral and clearing of subungual debris. No ulceration, no infection noted.  Return Visit-Office Procedure: Patient instructed to return to the office for a follow up visit 3 months for continued evaluation and treatment.    Gardiner Barefoot DPM

## 2018-07-29 ENCOUNTER — Other Ambulatory Visit: Payer: Self-pay | Admitting: Hematology and Oncology

## 2018-07-29 ENCOUNTER — Inpatient Hospital Stay: Payer: Medicare Other | Attending: Hematology and Oncology

## 2018-07-29 ENCOUNTER — Inpatient Hospital Stay: Payer: Medicare Other

## 2018-07-29 ENCOUNTER — Inpatient Hospital Stay (HOSPITAL_BASED_OUTPATIENT_CLINIC_OR_DEPARTMENT_OTHER): Payer: Medicare Other | Admitting: Hematology and Oncology

## 2018-07-29 ENCOUNTER — Telehealth: Payer: Self-pay

## 2018-07-29 ENCOUNTER — Encounter: Payer: Self-pay | Admitting: Hematology and Oncology

## 2018-07-29 DIAGNOSIS — N183 Chronic kidney disease, stage 3 unspecified: Secondary | ICD-10-CM

## 2018-07-29 DIAGNOSIS — C9000 Multiple myeloma not having achieved remission: Secondary | ICD-10-CM

## 2018-07-29 DIAGNOSIS — D61818 Other pancytopenia: Secondary | ICD-10-CM

## 2018-07-29 DIAGNOSIS — R232 Flushing: Secondary | ICD-10-CM

## 2018-07-29 DIAGNOSIS — N951 Menopausal and female climacteric states: Secondary | ICD-10-CM

## 2018-07-29 DIAGNOSIS — C61 Malignant neoplasm of prostate: Secondary | ICD-10-CM

## 2018-07-29 DIAGNOSIS — Z95828 Presence of other vascular implants and grafts: Secondary | ICD-10-CM

## 2018-07-29 DIAGNOSIS — Z79899 Other long term (current) drug therapy: Secondary | ICD-10-CM | POA: Insufficient documentation

## 2018-07-29 DIAGNOSIS — C9001 Multiple myeloma in remission: Secondary | ICD-10-CM

## 2018-07-29 LAB — COMPREHENSIVE METABOLIC PANEL
ALT: 15 U/L (ref 0–44)
AST: 17 U/L (ref 15–41)
Albumin: 3.3 g/dL — ABNORMAL LOW (ref 3.5–5.0)
Alkaline Phosphatase: 52 U/L (ref 38–126)
Anion gap: 8 (ref 5–15)
BUN: 20 mg/dL (ref 8–23)
CO2: 25 mmol/L (ref 22–32)
Calcium: 8.7 mg/dL — ABNORMAL LOW (ref 8.9–10.3)
Chloride: 107 mmol/L (ref 98–111)
Creatinine, Ser: 1.41 mg/dL — ABNORMAL HIGH (ref 0.61–1.24)
GFR calc Af Amer: 57 mL/min — ABNORMAL LOW (ref >60)
GFR calc non Af Amer: 49 mL/min — ABNORMAL LOW (ref >60)
GLUCOSE: 105 mg/dL — AB (ref 70–99)
Potassium: 3.6 mmol/L (ref 3.5–5.1)
Sodium: 140 mmol/L (ref 135–145)
Total Bilirubin: 0.8 mg/dL (ref 0.3–1.2)
Total Protein: 6.6 g/dL (ref 6.5–8.1)

## 2018-07-29 LAB — CBC WITH DIFFERENTIAL/PLATELET
ABS IMMATURE GRANULOCYTES: 0.02 10*3/uL (ref 0.00–0.07)
Basophils Absolute: 0.1 10*3/uL (ref 0.0–0.1)
Basophils Relative: 2 %
Eosinophils Absolute: 0.4 10*3/uL (ref 0.0–0.5)
Eosinophils Relative: 8 %
HCT: 39.5 % (ref 39.0–52.0)
Hemoglobin: 13.3 g/dL (ref 13.0–17.0)
Immature Granulocytes: 0 %
Lymphocytes Relative: 20 %
Lymphs Abs: 1 10*3/uL (ref 0.7–4.0)
MCH: 34 pg (ref 26.0–34.0)
MCHC: 33.7 g/dL (ref 30.0–36.0)
MCV: 101 fL — ABNORMAL HIGH (ref 80.0–100.0)
Monocytes Absolute: 0.6 10*3/uL (ref 0.1–1.0)
Monocytes Relative: 12 %
Neutro Abs: 2.9 10*3/uL (ref 1.7–7.7)
Neutrophils Relative %: 58 %
PLATELETS: 130 10*3/uL — AB (ref 150–400)
RBC: 3.91 MIL/uL — ABNORMAL LOW (ref 4.22–5.81)
RDW: 13.7 % (ref 11.5–15.5)
WBC: 5 10*3/uL (ref 4.0–10.5)
nRBC: 0 % (ref 0.0–0.2)

## 2018-07-29 LAB — TSH: TSH: 1.164 u[IU]/mL (ref 0.320–4.118)

## 2018-07-29 MED ORDER — HEPARIN SOD (PORK) LOCK FLUSH 100 UNIT/ML IV SOLN
500.0000 [IU] | Freq: Once | INTRAVENOUS | Status: AC | PRN
  Administered 2018-07-29: 500 [IU] via INTRAVENOUS
  Filled 2018-07-29: qty 5

## 2018-07-29 MED ORDER — ZOLEDRONIC ACID 4 MG/5ML IV CONC
3.5000 mg | Freq: Once | INTRAVENOUS | Status: AC
  Administered 2018-07-29: 3.5 mg via INTRAVENOUS
  Filled 2018-07-29: qty 4.38

## 2018-07-29 MED ORDER — SODIUM CHLORIDE 0.9 % IV SOLN
Freq: Once | INTRAVENOUS | Status: AC
  Administered 2018-07-29: 13:00:00 via INTRAVENOUS
  Filled 2018-07-29: qty 250

## 2018-07-29 MED ORDER — SODIUM CHLORIDE 0.9% FLUSH
10.0000 mL | INTRAVENOUS | Status: DC | PRN
  Administered 2018-07-29: 10 mL
  Filled 2018-07-29: qty 10

## 2018-07-29 NOTE — Assessment & Plan Note (Signed)
He has menopausal hot flashes due to hormonal manipulation He was started on Effexor I recommend he contact his urologist for further management

## 2018-07-29 NOTE — Patient Instructions (Signed)
Remember to chew two tums each day! Have a great weekend.  Zoledronic Acid injection (Hypercalcemia, Oncology) What is this medicine? ZOLEDRONIC ACID (ZOE le dron ik AS id) lowers the amount of calcium loss from bone. It is used to treat too much calcium in your blood from cancer. It is also used to prevent complications of cancer that has spread to the bone. This medicine may be used for other purposes; ask your health care provider or pharmacist if you have questions. COMMON BRAND NAME(S): Zometa What should I tell my health care provider before I take this medicine? They need to know if you have any of these conditions: -aspirin-sensitive asthma -cancer, especially if you are receiving medicines used to treat cancer -dental disease or wear dentures -infection -kidney disease -receiving corticosteroids like dexamethasone or prednisone -an unusual or allergic reaction to zoledronic acid, other medicines, foods, dyes, or preservatives -pregnant or trying to get pregnant -breast-feeding How should I use this medicine? This medicine is for infusion into a vein. It is given by a health care professional in a hospital or clinic setting. Talk to your pediatrician regarding the use of this medicine in children. Special care may be needed. Overdosage: If you think you have taken too much of this medicine contact a poison control center or emergency room at once. NOTE: This medicine is only for you. Do not share this medicine with others. What if I miss a dose? It is important not to miss your dose. Call your doctor or health care professional if you are unable to keep an appointment. What may interact with this medicine? -certain antibiotics given by injection -NSAIDs, medicines for pain and inflammation, like ibuprofen or naproxen -some diuretics like bumetanide, furosemide -teriparatide -thalidomide This list may not describe all possible interactions. Give your health care provider a list  of all the medicines, herbs, non-prescription drugs, or dietary supplements you use. Also tell them if you smoke, drink alcohol, or use illegal drugs. Some items may interact with your medicine. What should I watch for while using this medicine? Visit your doctor or health care professional for regular checkups. It may be some time before you see the benefit from this medicine. Do not stop taking your medicine unless your doctor tells you to. Your doctor may order blood tests or other tests to see how you are doing. Women should inform their doctor if they wish to become pregnant or think they might be pregnant. There is a potential for serious side effects to an unborn child. Talk to your health care professional or pharmacist for more information. You should make sure that you get enough calcium and vitamin D while you are taking this medicine. Discuss the foods you eat and the vitamins you take with your health care professional. Some people who take this medicine have severe bone, joint, and/or muscle pain. This medicine may also increase your risk for jaw problems or a broken thigh bone. Tell your doctor right away if you have severe pain in your jaw, bones, joints, or muscles. Tell your doctor if you have any pain that does not go away or that gets worse. Tell your dentist and dental surgeon that you are taking this medicine. You should not have major dental surgery while on this medicine. See your dentist to have a dental exam and fix any dental problems before starting this medicine. Take good care of your teeth while on this medicine. Make sure you see your dentist for regular follow-up appointments. What  side effects may I notice from receiving this medicine? Side effects that you should report to your doctor or health care professional as soon as possible: -allergic reactions like skin rash, itching or hives, swelling of the face, lips, or tongue -anxiety, confusion, or depression -breathing  problems -changes in vision -eye pain -feeling faint or lightheaded, falls -jaw pain, especially after dental work -mouth sores -muscle cramps, stiffness, or weakness -redness, blistering, peeling or loosening of the skin, including inside the mouth -trouble passing urine or change in the amount of urine Side effects that usually do not require medical attention (report to your doctor or health care professional if they continue or are bothersome): -bone, joint, or muscle pain -constipation -diarrhea -fever -hair loss -irritation at site where injected -loss of appetite -nausea, vomiting -stomach upset -trouble sleeping -trouble swallowing -weak or tired This list may not describe all possible side effects. Call your doctor for medical advice about side effects. You may report side effects to FDA at 1-800-FDA-1088. Where should I keep my medicine? This drug is given in a hospital or clinic and will not be stored at home. NOTE: This sheet is a summary. It may not cover all possible information. If you have questions about this medicine, talk to your doctor, pharmacist, or health care provider.  2019 Elsevier/Gold Standard (2013-12-09 14:19:39)

## 2018-07-29 NOTE — Assessment & Plan Note (Signed)
He is being followed by urologist I would defer to them for further management For now, he is not symptomatic

## 2018-07-29 NOTE — Assessment & Plan Note (Signed)
This is likely due to recent treatment. The patient denies recent history of bleeding such as epistaxis, hematuria or hematochezia. He is asymptomatic from the low platelet count. I will observe for now.   We will continue low-dose Revlimid as prescribed.

## 2018-07-29 NOTE — Telephone Encounter (Signed)
Printed avs and calender of upcoming appointment. Per 1/3 los

## 2018-07-29 NOTE — Assessment & Plan Note (Signed)
He is currently on low-dose Revlimid as a form of maintenance treatment In the meantime, he will continue aspirin, calcium with vitamin D He denies recent dental issues. He will continue Zometa every 6 months, reduced dose due to renal function, due today

## 2018-07-29 NOTE — Assessment & Plan Note (Signed)
He has mild stable CKD I emphasized the importance of adequate fluid hydration

## 2018-07-29 NOTE — Progress Notes (Signed)
[place] OFFICE PROGRESS NOTE  Patient Care Team: Jinny Sanders, MD as PCP - General Jeanann Lewandowsky, MD as Consulting Physician (Hematology and Oncology) Cathlean Marseilles, NP as Nurse Practitioner (Hematology and Oncology) Raynelle Bring, MD as Consulting Physician (Urology) Heath Lark, MD as Consulting Physician (Hematology and Oncology) Heron, Romilda Garret, [place] as Consulting Physician (Podiatry) Morton Stall, Howell Rucks, NP as Nurse Practitioner (Pain Medicine) Druscilla Brownie, MD as Consulting Physician (Dermatology) Bryson Ha, OD as Consulting Physician (Optometry)  ASSESSMENT & PLAN:  Multiple myeloma in remission [facility]) He is currently on low-dose Revlimid as a form of maintenance treatment In the meantime, he will continue aspirin, calcium with vitamin D He denies recent dental issues. He will continue Zometa every 6 months, reduced dose due to renal function, due today  Acquired pancytopenia [facility]) This is likely due to recent treatment. The patient denies recent history of bleeding such as epistaxis, hematuria or hematochezia. He is asymptomatic from the low platelet count. I will observe for now.   We will continue low-dose Revlimid as prescribed.  Malignant neoplasm of prostate [facility]) He is being followed by urologist I would defer to them for further management For now, he is not symptomatic  Chronic kidney disease, stage III (moderate) He has mild stable CKD I emphasized the importance of adequate fluid hydration   Menopausal syndrome (hot flashes) He has menopausal hot flashes due to hormonal manipulation He was started on Effexor I recommend he contact his urologist for further management   No orders of the defined types were placed in this encounter.   INTERVAL HISTORY: Please see below for problem oriented charting. He returns with his significant other for further follow-up He complained of frequent hot flashes from treatment for  prostate cancer He denies urinary difficulties No recent dental issue Denies recent infection, fever or chills His chronic back pain is stable  SUMMARY OF ONCOLOGIC HISTORY:  1. Velcade, Cytoxan, and Decadron on a weekly basis from 07/10/2011 through 10/23/2011; 2. High-dose chemotherapy on 11/23/2011, consisting of carmustine 600 mg IV. He received cytarabine 400 mg IV on 12/24/2011, 12/25/2011, 12/26/2011, and 12/27/2011. He received etoposide 300 mg IV daily from 12/24/2011 through 12/27/2011, four doses. He received melphalan 280 mg IV on 12/28/2011.  3. 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  Current therapy: -Revlimid 5 mg daily, 3 weeks on, 1 week off. Revlimid was started on 05/11/2012.  Zometa was started on 09/02/2012.  Dental clearance was obtained.  Zometa 4 mg was placed on hold due to current osteomyelitis but restarted on 06/23/2013  REVIEW OF SYSTEMS:   Constitutional: Denies fevers, chills or abnormal weight loss Eyes: Denies blurriness of vision Ears, nose, mouth, throat, and face: Denies mucositis or sore throat Respiratory: Denies cough, dyspnea or wheezes Cardiovascular: Denies palpitation, chest discomfort or lower extremity swelling Gastrointestinal:  Denies nausea, heartburn or change in bowel habits Skin: Denies abnormal skin rashes Lymphatics: Denies new lymphadenopathy or easy bruising Neurological:Denies numbness, tingling or new weaknesses Behavioral/Psych: Mood is stable, no new changes  All other systems were reviewed with the patient and are negative.  I have reviewed the past medical history, past surgical history, social history and family history with the patient and they are unchanged from previous note.  ALLERGIES:  is allergic to celebrex [celecoxib]; cymbalta [duloxetine hcl]; hydromorphone hcl; meloxicam; oxycontin [oxycodone]; percocet [oxycodone-acetaminophen]; vicodin [hydrocodone-acetaminophen]; and lyrica  [pregabalin].  MEDICATIONS:  Current Outpatient Medications  Medication Sig Dispense Refill  . fentaNYL (DURAGESIC -  DOSED MCG/HR) 75 MCG/HR Place 75 mcg onto the skin 2 days.    Marland Kitchen aspirin 81 MG tablet Take 81 mg by mouth daily.    . B Complex Vitamins (VITAMIN-B COMPLEX) TABS Take by mouth.    . Calcium Carbonate-Vitamin D 600-400 MG-UNIT per tablet Take by mouth.    . cholecalciferol (VITAMIN D) 1000 UNITS tablet Take 1,000 Units by mouth daily.    Marland Kitchen lenalidomide (REVLIMID) 5 MG capsule Take one capsule by mouth daily for 21 days, then off 7 days. 21 capsule 0  . lidocaine-prilocaine (EMLA) cream Apply topically as needed. Apply to port site one hour before treatment and cover with plastic wrap 30 g 2  . NONFORMULARY OR COMPOUNDED ITEM Baclofn/amitrp/ketamn cream topically to feet TID prn for neuropathy    . oxyCODONE (OXY IR/ROXICODONE) 5 MG immediate release tablet TK 1 T PO Q 4 H PRN P  0  . pantoprazole (PROTONIX) 40 MG tablet TAKE 1 TABLET BY MOUTH EVERY DAY 90 tablet 3  . pimecrolimus (ELIDEL) 1 % cream USE 1 APPLICATION ON THE SKIN BID PRN    . UNABLE TO FIND Apply topically.    . venlafaxine (EFFEXOR) 25 MG tablet Take by mouth.     Current Facility-Administered Medications  Medication Dose Route Frequency Provider Last Rate Last Dose  . 0.9 %  sodium chloride infusion  500 mL Intravenous Continuous Nandigam, Kavitha V, MD        PHYSICAL EXAMINATION: ECOG PERFORMANCE STATUS: 1 - Symptomatic but completely ambulatory  Vitals:   07/29/18 1240  BP: 112/69  Pulse: 77  Resp: 18  Temp: 97.8 F (36.6 C)  SpO2: 100%   Filed Weights   07/29/18 1240  Weight: 189 lb 9.6 oz (86 kg)    GENERAL:alert, no distress and comfortable SKIN: skin color, texture, turgor are normal, no rashes or significant lesions EYES: normal, Conjunctiva are pink and non-injected, sclera clear OROPHARYNX:no exudate, no erythema and lips, buccal mucosa, and tongue normal  NECK: supple, thyroid  normal size, non-tender, without nodularity LYMPH:  no palpable lymphadenopathy in the cervical, axillary or inguinal LUNGS: clear to auscultation and percussion with normal breathing effort HEART: regular rate & rhythm and no murmurs and no lower extremity edema ABDOMEN:abdomen soft, non-tender and normal bowel sounds Musculoskeletal:no cyanosis of digits and no clubbing  NEURO: alert & oriented x 3 with fluent speech, no focal motor/sensory deficits  LABORATORY DATA:  I have reviewed the data as listed    Component Value Date/Time   NA 140 07/29/2018 1213   NA 139 05/14/2017 1145   K 3.6 07/29/2018 1213   K 3.8 05/14/2017 1145   CL 107 07/29/2018 1213   CL 108 (H) 09/30/2013 0945   CL 110 (H) 12/29/2012 1151   CO2 25 07/29/2018 1213   CO2 27 05/14/2017 1145   GLUCOSE 105 (H) 07/29/2018 1213   GLUCOSE 109 05/14/2017 1145   GLUCOSE 127 (H) 12/29/2012 1151   BUN 20 07/29/2018 1213   BUN 17.9 05/14/2017 1145   CREATININE 1.41 (H) 07/29/2018 1213   CREATININE 1.3 05/14/2017 1145   CALCIUM 8.7 (L) 07/29/2018 1213   CALCIUM 9.3 05/14/2017 1145   PROT 6.6 07/29/2018 1213   PROT 6.2 05/14/2017 1145   PROT 6.6 05/14/2017 1145   ALBUMIN 3.3 (L) 07/29/2018 1213   ALBUMIN 3.3 (L) 05/14/2017 1145   AST 17 07/29/2018 1213   AST 19 05/14/2017 1145   ALT 15 07/29/2018 1213   ALT 18 05/14/2017  1145   ALKPHOS 52 07/29/2018 1213   ALKPHOS 45 05/14/2017 1145   BILITOT 0.8 07/29/2018 1213   BILITOT 0.67 05/14/2017 1145   GFRNONAA 49 (L) 07/29/2018 1213   GFRNONAA >60 09/30/2013 0945   GFRAA 57 (L) 07/29/2018 1213   GFRAA >60 09/30/2013 0945    No results found for: SPEP, UPEP  Lab Results  Component Value Date   WBC 5.0 07/29/2018   NEUTROABS 2.9 07/29/2018   HGB 13.3 07/29/2018   HCT 39.5 07/29/2018   MCV 101.0 (H) 07/29/2018   PLT 130 (L) 07/29/2018      Chemistry      Component Value Date/Time   NA 140 07/29/2018 1213   NA 139 05/14/2017 1145   K 3.6 07/29/2018 1213    K 3.8 05/14/2017 1145   CL 107 07/29/2018 1213   CL 108 (H) 09/30/2013 0945   CL 110 (H) 12/29/2012 1151   CO2 25 07/29/2018 1213   CO2 27 05/14/2017 1145   BUN 20 07/29/2018 1213   BUN 17.9 05/14/2017 1145   CREATININE 1.41 (H) 07/29/2018 1213   CREATININE 1.3 05/14/2017 1145      Component Value Date/Time   CALCIUM 8.7 (L) 07/29/2018 1213   CALCIUM 9.3 05/14/2017 1145   ALKPHOS 52 07/29/2018 1213   ALKPHOS 45 05/14/2017 1145   AST 17 07/29/2018 1213   AST 19 05/14/2017 1145   ALT 15 07/29/2018 1213   ALT 18 05/14/2017 1145   BILITOT 0.8 07/29/2018 1213   BILITOT 0.67 05/14/2017 1145      All questions were answered. The patient knows to call the clinic with any problems, questions or concerns. No barriers to learning was detected.  I spent 15 minutes counseling the patient face to face. The total time spent in the appointment was 20 minutes and more than 50% was on counseling and review of test results  Heath Lark, MD 07/29/2018 4:31 PM

## 2018-07-29 NOTE — Progress Notes (Signed)
Per Dr. Alvy Bimler, proceed with Zometa today despite calcium of 8.7. Will remind pt to chew 2 tums daily.

## 2018-08-01 LAB — MULTIPLE MYELOMA PANEL, SERUM
ALBUMIN/GLOB SERPL: 1.4 (ref 0.7–1.7)
ALPHA2 GLOB SERPL ELPH-MCNC: 0.7 g/dL (ref 0.4–1.0)
Albumin SerPl Elph-Mcnc: 3.7 g/dL (ref 2.9–4.4)
Alpha 1: 0.2 g/dL (ref 0.0–0.4)
B-Globulin SerPl Elph-Mcnc: 0.7 g/dL (ref 0.7–1.3)
Gamma Glob SerPl Elph-Mcnc: 1.2 g/dL (ref 0.4–1.8)
Globulin, Total: 2.8 g/dL (ref 2.2–3.9)
IGG (IMMUNOGLOBIN G), SERUM: 1248 mg/dL (ref 700–1600)
IgA: 325 mg/dL (ref 61–437)
IgM (Immunoglobulin M), Srm: 29 mg/dL (ref 15–143)
Total Protein ELP: 6.5 g/dL (ref 6.0–8.5)

## 2018-08-02 ENCOUNTER — Telehealth: Payer: Self-pay

## 2018-08-02 NOTE — Telephone Encounter (Signed)
Called and given below message. He verbalized understanding. 

## 2018-08-02 NOTE — Telephone Encounter (Signed)
-----   Message from Heath Lark, MD sent at 08/02/2018  7:38 AM EST ----- Regarding: myeloma panel ok Pls let him know ----- Message ----- From: Interface, Lab In [facility] Sent: 07/29/2018  12:31 PM EST To: Heath Lark, MD

## 2018-08-09 ENCOUNTER — Encounter: Payer: Self-pay | Admitting: Family Medicine

## 2018-08-12 ENCOUNTER — Other Ambulatory Visit: Payer: Self-pay

## 2018-08-12 MED ORDER — LENALIDOMIDE 5 MG PO CAPS: ORAL_CAPSULE | ORAL | 0 refills | Status: DC

## 2018-09-09 ENCOUNTER — Inpatient Hospital Stay: Payer: Medicare Other | Attending: Hematology and Oncology

## 2018-09-09 DIAGNOSIS — Z95828 Presence of other vascular implants and grafts: Secondary | ICD-10-CM

## 2018-09-09 DIAGNOSIS — C61 Malignant neoplasm of prostate: Secondary | ICD-10-CM | POA: Insufficient documentation

## 2018-09-09 DIAGNOSIS — Z452 Encounter for adjustment and management of vascular access device: Secondary | ICD-10-CM | POA: Diagnosis not present

## 2018-09-09 DIAGNOSIS — C9001 Multiple myeloma in remission: Secondary | ICD-10-CM | POA: Diagnosis present

## 2018-09-09 MED ORDER — SODIUM CHLORIDE 0.9% FLUSH
10.0000 mL | INTRAVENOUS | Status: DC | PRN
  Administered 2018-09-09: 10 mL
  Filled 2018-09-09: qty 10

## 2018-09-09 MED ORDER — HEPARIN SOD (PORK) LOCK FLUSH 100 UNIT/ML IV SOLN
500.0000 [IU] | Freq: Once | INTRAVENOUS | Status: AC | PRN
  Administered 2018-09-09: 500 [IU] via INTRAVENOUS
  Filled 2018-09-09: qty 5

## 2018-09-12 ENCOUNTER — Other Ambulatory Visit: Payer: Self-pay | Admitting: *Deleted

## 2018-09-12 MED ORDER — LENALIDOMIDE 5 MG PO CAPS: ORAL_CAPSULE | ORAL | 0 refills | Status: DC

## 2018-09-19 ENCOUNTER — Encounter: Payer: Self-pay | Admitting: Podiatry

## 2018-09-19 ENCOUNTER — Ambulatory Visit (INDEPENDENT_AMBULATORY_CARE_PROVIDER_SITE_OTHER): Payer: Medicare Other | Admitting: Podiatry

## 2018-09-19 DIAGNOSIS — Q828 Other specified congenital malformations of skin: Secondary | ICD-10-CM

## 2018-09-19 DIAGNOSIS — G620 Drug-induced polyneuropathy: Secondary | ICD-10-CM

## 2018-09-19 DIAGNOSIS — T300 Burn of unspecified body region, unspecified degree: Secondary | ICD-10-CM
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
## 2018-10-03 ENCOUNTER — Ambulatory Visit (INDEPENDENT_AMBULATORY_CARE_PROVIDER_SITE_OTHER): Payer: Medicare Other | Admitting: Podiatry

## 2018-10-03 ENCOUNTER — Encounter: Payer: Self-pay | Admitting: Podiatry

## 2018-10-03 DIAGNOSIS — Q828 Other specified congenital malformations of skin: Secondary | ICD-10-CM | POA: Diagnosis not present
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
## 2018-10-06 ENCOUNTER — Telehealth: Payer: Self-pay

## 2018-10-06 MED ORDER — LENALIDOMIDE 5 MG PO CAPS: ORAL_CAPSULE | ORAL | 0 refills | Status: DC

## 2018-10-06 NOTE — Telephone Encounter (Signed)
Prescription for Revlimed sent to pharmacy

## 2018-10-17 ENCOUNTER — Encounter: Payer: Self-pay | Admitting: Podiatry

## 2018-10-17 ENCOUNTER — Other Ambulatory Visit: Payer: Self-pay

## 2018-10-17 ENCOUNTER — Ambulatory Visit (INDEPENDENT_AMBULATORY_CARE_PROVIDER_SITE_OTHER): Payer: Medicare Other | Admitting: Podiatry

## 2018-10-17 DIAGNOSIS — M79676 Pain in unspecified toe(s): Secondary | ICD-10-CM | POA: Diagnosis not present

## 2018-10-17 DIAGNOSIS — B351 Tinea unguium: Secondary | ICD-10-CM | POA: Diagnosis not present

## 2018-10-17 DIAGNOSIS — Q828 Other specified congenital malformations of skin: Secondary | ICD-10-CM | POA: Diagnosis not present

## 2018-10-17 DIAGNOSIS — G620 Drug-induced polyneuropathy: Secondary | ICD-10-CM | POA: Diagnosis not present
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
## 2018-10-20 ENCOUNTER — Inpatient Hospital Stay: Payer: Medicare Other

## 2018-10-20 ENCOUNTER — Other Ambulatory Visit: Payer: Self-pay

## 2018-10-20 ENCOUNTER — Inpatient Hospital Stay: Payer: Medicare Other | Attending: Hematology and Oncology | Admitting: Hematology and Oncology

## 2018-10-20 ENCOUNTER — Encounter: Payer: Self-pay | Admitting: Hematology and Oncology

## 2018-10-20 ENCOUNTER — Telehealth: Payer: Self-pay | Admitting: Hematology and Oncology

## 2018-10-20 DIAGNOSIS — N183 Chronic kidney disease, stage 3 unspecified: Secondary | ICD-10-CM

## 2018-10-20 DIAGNOSIS — D61818 Other pancytopenia: Secondary | ICD-10-CM | POA: Diagnosis not present

## 2018-10-20 DIAGNOSIS — C9001 Multiple myeloma in remission: Secondary | ICD-10-CM

## 2018-10-20 DIAGNOSIS — C61 Malignant neoplasm of prostate: Secondary | ICD-10-CM | POA: Diagnosis not present

## 2018-10-20 DIAGNOSIS — Z95828 Presence of other vascular implants and grafts: Secondary | ICD-10-CM

## 2018-10-20 DIAGNOSIS — Z85828 Personal history of other malignant neoplasm of skin: Secondary | ICD-10-CM | POA: Insufficient documentation

## 2018-10-20 DIAGNOSIS — C9000 Multiple myeloma not having achieved remission: Secondary | ICD-10-CM

## 2018-10-20 LAB — COMPREHENSIVE METABOLIC PANEL
ALT: 17 U/L (ref 0–44)
ANION GAP: 9 (ref 5–15)
AST: 20 U/L (ref 15–41)
Albumin: 3.3 g/dL — ABNORMAL LOW (ref 3.5–5.0)
Alkaline Phosphatase: 49 U/L (ref 38–126)
BUN: 23 mg/dL (ref 8–23)
CO2: 24 mmol/L (ref 22–32)
Calcium: 8.7 mg/dL — ABNORMAL LOW (ref 8.9–10.3)
Chloride: 106 mmol/L (ref 98–111)
Creatinine, Ser: 1.44 mg/dL — ABNORMAL HIGH (ref 0.61–1.24)
GFR calc Af Amer: 56 mL/min — ABNORMAL LOW (ref >60)
GFR, EST NON AFRICAN AMERICAN: 48 mL/min — AB (ref >60)
Glucose, Bld: 123 mg/dL — ABNORMAL HIGH (ref 70–99)
Potassium: 3.7 mmol/L (ref 3.5–5.1)
Sodium: 139 mmol/L (ref 135–145)
Total Bilirubin: 0.7 mg/dL (ref 0.3–1.2)
Total Protein: 6.9 g/dL (ref 6.5–8.1)

## 2018-10-20 LAB — CBC WITH DIFFERENTIAL/PLATELET
Abs Immature Granulocytes: 0.01 10*3/uL (ref 0.00–0.07)
Basophils Absolute: 0.1 10*3/uL (ref 0.0–0.1)
Basophils Relative: 2 %
Eosinophils Absolute: 0.5 10*3/uL (ref 0.0–0.5)
Eosinophils Relative: 11 %
HCT: 37.8 % — ABNORMAL LOW (ref 39.0–52.0)
Hemoglobin: 12.9 g/dL — ABNORMAL LOW (ref 13.0–17.0)
Immature Granulocytes: 0 %
Lymphocytes Relative: 26 %
Lymphs Abs: 1.1 10*3/uL (ref 0.7–4.0)
MCH: 35.3 pg — AB (ref 26.0–34.0)
MCHC: 34.1 g/dL (ref 30.0–36.0)
MCV: 103.6 fL — AB (ref 80.0–100.0)
MONOS PCT: 12 %
Monocytes Absolute: 0.5 10*3/uL (ref 0.1–1.0)
Neutro Abs: 2 10*3/uL (ref 1.7–7.7)
Neutrophils Relative %: 49 %
Platelets: 140 10*3/uL — ABNORMAL LOW (ref 150–400)
RBC: 3.65 MIL/uL — ABNORMAL LOW (ref 4.22–5.81)
RDW: 14 % (ref 11.5–15.5)
WBC: 4.2 10*3/uL (ref 4.0–10.5)
nRBC: 0 % (ref 0.0–0.2)

## 2018-10-20 MED ORDER — ZOLEDRONIC ACID 4 MG/5ML IV CONC
3.5000 mg | Freq: Once | INTRAVENOUS | Status: AC
  Administered 2018-10-20: 3.5 mg via INTRAVENOUS
  Filled 2018-10-20: qty 4.38

## 2018-10-20 MED ORDER — SODIUM CHLORIDE 0.9 % IV SOLN
Freq: Once | INTRAVENOUS | Status: AC
  Administered 2018-10-20: 14:00:00 via INTRAVENOUS
  Filled 2018-10-20: qty 250

## 2018-10-20 MED ORDER — HEPARIN SOD (PORK) LOCK FLUSH 100 UNIT/ML IV SOLN
500.0000 [IU] | Freq: Once | INTRAVENOUS | Status: AC | PRN
  Administered 2018-10-20: 500 [IU] via INTRAVENOUS
  Filled 2018-10-20: qty 5

## 2018-10-20 MED ORDER — SODIUM CHLORIDE 0.9% FLUSH
10.0000 mL | Freq: Once | INTRAVENOUS | Status: AC
  Administered 2018-10-20: 10 mL via INTRAVENOUS
  Filled 2018-10-20: qty 10

## 2018-10-20 NOTE — Telephone Encounter (Signed)
Gave avs and calendar ° °

## 2018-10-20 NOTE — Assessment & Plan Note (Signed)
He is being followed by urologist I would defer to them for further management For now, he is not symptomatic

## 2018-10-20 NOTE — Progress Notes (Signed)
[place] OFFICE PROGRESS NOTE  Patient Care Team: Jinny Sanders, MD as PCP - General Jeanann Lewandowsky, MD as Consulting Physician (Hematology and Oncology) Cathlean Marseilles, NP as Nurse Practitioner (Hematology and Oncology) Raynelle Bring, MD as Consulting Physician (Urology) Heath Lark, MD as Consulting Physician (Hematology and Oncology) [place], Romilda Garret, [place] as Consulting Physician (Podiatry) Morton Stall, Howell Rucks, NP as Nurse Practitioner (Pain Medicine) Druscilla Brownie, MD as Consulting Physician (Dermatology) Bryson Ha, OD as Consulting Physician (Optometry)  ASSESSMENT & PLAN:  Multiple myeloma in remission [facility]) He is currently on low-dose Revlimid as a form of maintenance treatment In the meantime, he will continue aspirin, calcium with vitamin D He denies recent dental issues. He will continue Zometa every 3 months, reduced dose due to renal function, due today  Acquired pancytopenia ([place]) This is likely due to recent treatment. The patient denies recent history of bleeding such as epistaxis, hematuria or hematochezia. He is asymptomatic from the low platelet count. I will observe for now.   We will continue low-dose Revlimid as prescribed.  Chronic kidney disease, stage III (moderate) He has mild stable CKD I emphasized the importance of adequate fluid hydration   Malignant neoplasm of prostate ([place]) He is being followed by urologist I would defer to them for further management For now, he is not symptomatic  History of skin cancer He had recent Mohs procedure on his face He will continue close follow-up with dermatologist   No orders of the defined types were placed in this encounter.   INTERVAL HISTORY: Please see below for problem oriented charting. He returns for Zometa infusion He continues close follow-up with urologist He is receiving regular treatment through his third urologist office with improvement of PSA level He had  recent Mohs procedure to his skin for squamous cell carcinoma He denies problem with Revlimid No recent infection, fever or chills No new bone pain No recent dental issues  SUMMARY OF ONCOLOGIC HISTORY:  1. Velcade, Cytoxan, and Decadron on a weekly basis from 07/10/2011 through 10/23/2011; 2. High-dose chemotherapy on 11/23/2011, consisting of carmustine 600 mg IV. He received cytarabine 400 mg IV on 12/24/2011, 12/25/2011, 12/26/2011, and 12/27/2011. He received etoposide 300 mg IV daily from 12/24/2011 through 12/27/2011, four doses. He received melphalan 280 mg IV on 12/28/2011.  3. 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  Current therapy: -Revlimid 5 mg daily, 3 weeks on, 1 week off. Revlimid was started on 05/11/2012.  Zometa was started on 09/02/2012.  Dental clearance was obtained.  He was diagnosed with prostate cancer and received treatment through urology service  REVIEW OF SYSTEMS:   Constitutional: Denies fevers, chills or abnormal weight loss Eyes: Denies blurriness of vision Ears, nose, mouth, throat, and face: Denies mucositis or sore throat Respiratory: Denies cough, dyspnea or wheezes Cardiovascular: Denies palpitation, chest discomfort or lower extremity swelling Gastrointestinal:  Denies nausea, heartburn or change in bowel habits Lymphatics: Denies new lymphadenopathy or easy bruising Neurological:Denies numbness, tingling or new weaknesses Behavioral/Psych: Mood is stable, no new changes  All other systems were reviewed with the patient and are negative.  I have reviewed the past medical history, past surgical history, social history and family history with the patient and they are unchanged from previous note.  ALLERGIES:  is allergic to celebrex [celecoxib]; cymbalta [duloxetine hcl]; hydromorphone hcl; meloxicam; oxycontin [oxycodone]; percocet [oxycodone-acetaminophen]; vicodin [hydrocodone-acetaminophen]; and lyrica  [pregabalin].  MEDICATIONS:  Current Outpatient Medications  Medication Sig Dispense Refill  . aspirin  81 MG tablet Take 81 mg by mouth daily.    . B Complex Vitamins (VITAMIN-B COMPLEX) TABS Take by mouth.    . Calcium Carbonate-Vitamin D 600-400 MG-UNIT per tablet Take by mouth.    . cholecalciferol (VITAMIN D) 1000 UNITS tablet Take 1,000 Units by mouth daily.    . fentaNYL (DURAGESIC) 75 MCG/HR APP 1 PA EXT TO THE SKIN QOD    . lenalidomide (REVLIMID) 5 MG capsule Take one capsule by mouth daily for 21 days, then off 7 days. 21 capsule 0  . lidocaine-prilocaine (EMLA) cream Apply topically as needed. Apply to port site one hour before treatment and cover with plastic wrap 30 g 2  . NONFORMULARY OR COMPOUNDED ITEM Baclofn/amitrp/ketamn cream topically to feet TID prn for neuropathy    . oxyCODONE (OXY IR/ROXICODONE) 5 MG immediate release tablet TK 1 T PO Q 4 H PRN P  0  . pantoprazole (PROTONIX) 40 MG tablet TAKE 1 TABLET BY MOUTH EVERY DAY 90 tablet 3  . pimecrolimus (ELIDEL) 1 % cream USE 1 APPLICATION ON THE SKIN BID PRN    . prednisoLONE acetate (PRED FORTE) 1 % ophthalmic suspension SHAKE LQ AND INT 1 GTT IN OU QID    . UNABLE TO FIND Apply topically.    . venlafaxine (EFFEXOR) 25 MG tablet Take by mouth.     Current Facility-Administered Medications  Medication Dose Route Frequency Provider Last Rate Last Dose  . 0.9 %  sodium chloride infusion  500 mL Intravenous Continuous Nandigam, Kavitha V, MD        PHYSICAL EXAMINATION: ECOG PERFORMANCE STATUS: 1 - Symptomatic but completely ambulatory GENERAL:alert, no distress and comfortable SKIN: skin color, texture, turgor are normal, no rashes or significant lesions EYES: normal, Conjunctiva are pink and non-injected, sclera clear OROPHARYNX:no exudate, no erythema and lips, buccal mucosa, and tongue normal.  No signs of ONJ NECK: supple, thyroid normal size, non-tender, without nodularity LYMPH:  no palpable lymphadenopathy in  the cervical, axillary or inguinal LUNGS: clear to auscultation and percussion with normal breathing effort HEART: regular rate & rhythm and no murmurs and no lower extremity edema ABDOMEN:abdomen soft, non-tender and normal bowel sounds Musculoskeletal:no cyanosis of digits and no clubbing  NEURO: alert & oriented x 3 with fluent speech, no focal motor/sensory deficits  LABORATORY DATA:  I have reviewed the data as listed    Component Value Date/Time   NA 140 07/29/2018 1213   NA 139 05/14/2017 1145   K 3.6 07/29/2018 1213   K 3.8 05/14/2017 1145   CL 107 07/29/2018 1213   CL 108 (H) 09/30/2013 0945   CL 110 (H) 12/29/2012 1151   CO2 25 07/29/2018 1213   CO2 27 05/14/2017 1145   GLUCOSE 105 (H) 07/29/2018 1213   GLUCOSE 109 05/14/2017 1145   GLUCOSE 127 (H) 12/29/2012 1151   BUN 20 07/29/2018 1213   BUN 17.9 05/14/2017 1145   CREATININE 1.41 (H) 07/29/2018 1213   CREATININE 1.3 05/14/2017 1145   CALCIUM 8.7 (L) 07/29/2018 1213   CALCIUM 9.3 05/14/2017 1145   PROT 6.6 07/29/2018 1213   PROT 6.2 05/14/2017 1145   PROT 6.6 05/14/2017 1145   ALBUMIN 3.3 (L) 07/29/2018 1213   ALBUMIN 3.3 (L) 05/14/2017 1145   AST 17 07/29/2018 1213   AST 19 05/14/2017 1145   ALT 15 07/29/2018 1213   ALT 18 05/14/2017 1145   ALKPHOS 52 07/29/2018 1213   ALKPHOS 45 05/14/2017 1145   BILITOT 0.8 07/29/2018 1213  BILITOT 0.67 05/14/2017 1145   GFRNONAA 49 (L) 07/29/2018 1213   GFRNONAA >60 09/30/2013 0945   GFRAA 57 (L) 07/29/2018 1213   GFRAA >60 09/30/2013 0945    No results found for: SPEP, UPEP  Lab Results  Component Value Date   WBC 4.2 10/20/2018   NEUTROABS 2.0 10/20/2018   HGB 12.9 (L) 10/20/2018   HCT 37.8 (L) 10/20/2018   MCV 103.6 (H) 10/20/2018   PLT 140 (L) 10/20/2018      Chemistry      Component Value Date/Time   NA 140 07/29/2018 1213   NA 139 05/14/2017 1145   K 3.6 07/29/2018 1213   K 3.8 05/14/2017 1145   CL 107 07/29/2018 1213   CL 108 (H) 09/30/2013  0945   CL 110 (H) 12/29/2012 1151   CO2 25 07/29/2018 1213   CO2 27 05/14/2017 1145   BUN 20 07/29/2018 1213   BUN 17.9 05/14/2017 1145   CREATININE 1.41 (H) 07/29/2018 1213   CREATININE 1.3 05/14/2017 1145      Component Value Date/Time   CALCIUM 8.7 (L) 07/29/2018 1213   CALCIUM 9.3 05/14/2017 1145   ALKPHOS 52 07/29/2018 1213   ALKPHOS 45 05/14/2017 1145   AST 17 07/29/2018 1213   AST 19 05/14/2017 1145   ALT 15 07/29/2018 1213   ALT 18 05/14/2017 1145   BILITOT 0.8 07/29/2018 1213   BILITOT 0.67 05/14/2017 1145      All questions were answered. The patient knows to call the clinic with any problems, questions or concerns. No barriers to learning was detected.  I spent 15 minutes counseling the patient face to face. The total time spent in the appointment was 20 minutes and more than 50% was on counseling and review of test results  Heath Lark, MD 10/20/2018 1:45 PM

## 2018-10-20 NOTE — Assessment & Plan Note (Signed)
This is likely due to recent treatment. The patient denies recent history of bleeding such as epistaxis, hematuria or hematochezia. He is asymptomatic from the low platelet count. I will observe for now.   We will continue low-dose Revlimid as prescribed.

## 2018-10-20 NOTE — Assessment & Plan Note (Signed)
He is currently on low-dose Revlimid as a form of maintenance treatment In the meantime, he will continue aspirin, calcium with vitamin D He denies recent dental issues. He will continue Zometa every 3 months, reduced dose due to renal function, due today

## 2018-10-20 NOTE — Assessment & Plan Note (Signed)
He had recent Mohs procedure on his face He will continue close follow-up with dermatologist

## 2018-10-20 NOTE — Assessment & Plan Note (Signed)
He has mild stable CKD I emphasized the importance of adequate fluid hydration

## 2018-10-21 LAB — MULTIPLE MYELOMA PANEL, SERUM
Albumin SerPl Elph-Mcnc: 3.4 g/dL (ref 2.9–4.4)
Albumin/Glob SerPl: 1.2 (ref 0.7–1.7)
Alpha 1: 0.2 g/dL (ref 0.0–0.4)
Alpha2 Glob SerPl Elph-Mcnc: 0.7 g/dL (ref 0.4–1.0)
B-Globulin SerPl Elph-Mcnc: 0.8 g/dL (ref 0.7–1.3)
GAMMA GLOB SERPL ELPH-MCNC: 1.2 g/dL (ref 0.4–1.8)
GLOBULIN, TOTAL: 2.9 g/dL (ref 2.2–3.9)
IgA: 330 mg/dL (ref 61–437)
IgG (Immunoglobin G), Serum: 1382 mg/dL (ref 700–1600)
IgM (Immunoglobulin M), Srm: 32 mg/dL (ref 15–143)
Total Protein ELP: 6.3 g/dL (ref 6.0–8.5)

## 2018-10-21 LAB — KAPPA/LAMBDA LIGHT CHAINS
Kappa free light chain: 67.3 mg/L — ABNORMAL HIGH (ref 3.3–19.4)
Kappa, lambda light chain ratio: 1.43 (ref 0.26–1.65)
Lambda free light chains: 47.1 mg/L — ABNORMAL HIGH (ref 5.7–26.3)

## 2018-10-24 ENCOUNTER — Telehealth: Payer: Self-pay

## 2018-10-24 NOTE — Telephone Encounter (Signed)
-----   Message from Heath Lark, MD sent at 10/24/2018  8:05 AM EDT ----- Regarding: myeloma panel ok Pls call and let him know he is still in remission

## 2018-10-24 NOTE — Telephone Encounter (Signed)
Called and given below message. He verbalized understanding. 

## 2018-11-02 ENCOUNTER — Other Ambulatory Visit: Payer: Self-pay

## 2018-11-02 ENCOUNTER — Encounter: Payer: Self-pay | Admitting: Podiatry

## 2018-11-02 ENCOUNTER — Ambulatory Visit (INDEPENDENT_AMBULATORY_CARE_PROVIDER_SITE_OTHER): Payer: Medicare Other | Admitting: Podiatry

## 2018-11-02 DIAGNOSIS — L97521 Non-pressure chronic ulcer of other part of left foot limited to breakdown of skin: Secondary | ICD-10-CM

## 2018-11-02 MED ORDER — MUPIROCIN 2 % EX OINT: TOPICAL_OINTMENT | CUTANEOUS | 1 refills | Status: DC

## 2018-11-02 NOTE — Progress Notes (Signed)
He presents today after having not seen him for about a year with a chief complaint of a painful area beneath the second metatarsal bone of the left foot.  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  He denies fever chills nausea vomiting muscle aches and pains.  Objective: Vital signs are stable he is alert and oriented x3 pulses are palpable.  He has good capillary fill time severe bunion deformity and hallux interphalangeal deformity reactive hyperkeratotic lesion subsecond metatarsal head with blood beneath the lesion there is a small open lesion measuring about 3 mm x 3 mm with granulation tissue no necrotic tissue is present.  This does not probe deep.  There is no signs of cellulitis no signs of infection to the dorsal aspect of the foot.  Assessment: Ulceration with chemo-induced neuropathy.  Plan: Debrided the area today placed padding demonstrated this to him he will follow-up with [name] for orthotics and we will also write a prescription for Bactroban ointment to be applied once daily and I will follow-up with him in 2 weeks.  He will watch for signs and symptoms of infection notify me if there are any.

## 2018-11-03 ENCOUNTER — Other Ambulatory Visit: Payer: Self-pay

## 2018-11-03 MED ORDER — LENALIDOMIDE 5 MG PO CAPS: ORAL_CAPSULE | ORAL | 0 refills | Status: DC

## 2018-11-16 ENCOUNTER — Ambulatory Visit: Payer: Medicare Other | Admitting: Orthotics

## 2018-11-16 ENCOUNTER — Ambulatory Visit (INDEPENDENT_AMBULATORY_CARE_PROVIDER_SITE_OTHER): Payer: Medicare Other | Admitting: Podiatry

## 2018-11-16 ENCOUNTER — Encounter: Payer: Self-pay | Admitting: Podiatry

## 2018-11-16 ENCOUNTER — Other Ambulatory Visit: Payer: Self-pay

## 2018-11-16 VITALS — Temp 97.3°F

## 2018-11-16 DIAGNOSIS — M79676 Pain in unspecified toe(s): Secondary | ICD-10-CM

## 2018-11-16 DIAGNOSIS — L97521 Non-pressure chronic ulcer of other part of left foot limited to breakdown of skin: Secondary | ICD-10-CM

## 2018-11-16 DIAGNOSIS — L97511 Non-pressure chronic ulcer of other part of right foot limited to breakdown of skin: Secondary | ICD-10-CM

## 2018-11-16 DIAGNOSIS — Q828 Other specified congenital malformations of skin: Secondary | ICD-10-CM

## 2018-11-16 DIAGNOSIS — B351 Tinea unguium: Secondary | ICD-10-CM

## 2018-11-16 NOTE — Progress Notes (Signed)
He presents today for follow-up of ulcer subsecond metatarsal head of the left foot.  Today he says that he worked the flip.  Objective: Vital signs are stable he is alert and oriented x3.  Pulses are palpable.  Reactive hyperkeratotic tissue subsecond metatarsal head of the left foot is present.  This has resulted in superficial skin breakdown currently does not appear to be clinically infected.  There is no probing deep there is some marginal granulation tissue very small amount of the most proximal portion of the ulceration it easily believable.  Assessment: Plantarflexed second metatarsal head even though he has a amputated second toe.  Superficial ulceration.  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  Plan: Debrided the wound today placed padding he was seen by Liliane Channel for custom inserts and will follow-up with me in a couple weeks for reevaluation.

## 2018-11-16 NOTE — Progress Notes (Signed)
Patient to get Diabetic Insert (2 pair with offload to address skin ulcer)

## 2018-11-21 ENCOUNTER — Other Ambulatory Visit: Payer: Self-pay

## 2018-11-21 MED ORDER — LENALIDOMIDE 5 MG PO CAPS: ORAL_CAPSULE | ORAL | 0 refills | Status: DC

## 2018-11-30 ENCOUNTER — Other Ambulatory Visit: Payer: Self-pay

## 2018-11-30 ENCOUNTER — Ambulatory Visit (INDEPENDENT_AMBULATORY_CARE_PROVIDER_SITE_OTHER): Payer: Medicare Other | Admitting: Podiatry

## 2018-11-30 ENCOUNTER — Encounter: Payer: Self-pay | Admitting: Podiatry

## 2018-11-30 VITALS — Temp 97.0°F

## 2018-11-30 DIAGNOSIS — L97521 Non-pressure chronic ulcer of other part of left foot limited to breakdown of skin: Secondary | ICD-10-CM

## 2018-11-30 NOTE — Progress Notes (Signed)
He presents today to see about his ulceration subsecond metatarsal head of the left foot.  This area has resulted in skin breakdown status post excision amputation of his second toe at the level of the metatarsal phalangeal joint.  He denies fever chills nausea vomiting muscle aches pain states that he continues to soak it daily and dress it.  Objective: Vital signs are stable alert and oriented x3 reactive hyperkeratotic tissue surrounding the wound today was debrided demonstrates mild bleeding no erythema cellulitis drainage or odor.  No purulence no malodor.  Assessment: Chronic ulceration subsecond left.  Plan: He will follow-up with me in a couple weeks for reevaluation and we will reevaluate the orthotics at that time.  We did discuss possible need for resection of the metatarsal head.

## 2018-12-01 ENCOUNTER — Other Ambulatory Visit: Payer: Self-pay

## 2018-12-01 ENCOUNTER — Inpatient Hospital Stay: Payer: Medicare Other | Attending: Hematology and Oncology

## 2018-12-01 DIAGNOSIS — Z452 Encounter for adjustment and management of vascular access device: Secondary | ICD-10-CM | POA: Insufficient documentation

## 2018-12-01 DIAGNOSIS — C9001 Multiple myeloma in remission: Secondary | ICD-10-CM | POA: Insufficient documentation

## 2018-12-01 DIAGNOSIS — Z95828 Presence of other vascular implants and grafts: Secondary | ICD-10-CM

## 2018-12-01 MED ORDER — SODIUM CHLORIDE 0.9% FLUSH
10.0000 mL | INTRAVENOUS | Status: DC | PRN
  Administered 2018-12-01: 10 mL
  Filled 2018-12-01: qty 10

## 2018-12-01 MED ORDER — HEPARIN SOD (PORK) LOCK FLUSH 100 UNIT/ML IV SOLN
500.0000 [IU] | Freq: Once | INTRAVENOUS | Status: AC | PRN
  Administered 2018-12-01: 500 [IU] via INTRAVENOUS
  Filled 2018-12-01: qty 5

## 2018-12-07 ENCOUNTER — Ambulatory Visit: Payer: Medicare Other | Admitting: Orthotics

## 2018-12-07 ENCOUNTER — Other Ambulatory Visit: Payer: Self-pay

## 2018-12-07 DIAGNOSIS — L97521 Non-pressure chronic ulcer of other part of left foot limited to breakdown of skin: Secondary | ICD-10-CM

## 2018-12-07 DIAGNOSIS — B351 Tinea unguium: Secondary | ICD-10-CM

## 2018-12-07 DIAGNOSIS — M79676 Pain in unspecified toe(s): Secondary | ICD-10-CM

## 2018-12-07 DIAGNOSIS — Q828 Other specified congenital malformations of skin: Secondary | ICD-10-CM

## 2018-12-07 DIAGNOSIS — G620 Drug-induced polyneuropathy: Secondary | ICD-10-CM

## 2018-12-07 NOTE — Progress Notes (Signed)
Recast for Diabetic inserts due to foam box being lost.

## 2018-12-14 ENCOUNTER — Ambulatory Visit: Payer: Medicare Other | Admitting: Orthotics

## 2018-12-14 ENCOUNTER — Ambulatory Visit (INDEPENDENT_AMBULATORY_CARE_PROVIDER_SITE_OTHER): Payer: Medicare Other | Admitting: Podiatry

## 2018-12-14 ENCOUNTER — Other Ambulatory Visit: Payer: Self-pay

## 2018-12-14 ENCOUNTER — Encounter: Payer: Self-pay | Admitting: Podiatry

## 2018-12-14 ENCOUNTER — Ambulatory Visit: Payer: Medicare Other | Admitting: Podiatry

## 2018-12-14 DIAGNOSIS — Q828 Other specified congenital malformations of skin: Secondary | ICD-10-CM

## 2018-12-14 DIAGNOSIS — B351 Tinea unguium: Secondary | ICD-10-CM

## 2018-12-14 DIAGNOSIS — L97521 Non-pressure chronic ulcer of other part of left foot limited to breakdown of skin: Secondary | ICD-10-CM | POA: Diagnosis not present

## 2018-12-14 DIAGNOSIS — G620 Drug-induced polyneuropathy: Secondary | ICD-10-CM

## 2018-12-14 NOTE — Progress Notes (Signed)
He presents today for follow-up of ulceration subsecond metatarsal of the left foot.  States that it is not doing any better still hurts a lot.  Objective: Vital signs are stable he is alert and oriented x3 there is no erythema edema cellulitis drainage odor to the plantar aspect of the left foot though there is an open wound there that measures only about 2 mm x 3 mm and it does not probe to bone.  It does probe to subcutaneous tissue.  No purulence no malodor.  Assessment: Ulceration plantar aspect left foot.  Plan: He was supposed to pick up his inserts today however they had been lost in transit and was recasted last week and are not available for pickup today I did debride the area today as closely as I could to bleeding placed silver nitrate on it a small amount of antibiotic ointment and a pad and redressed him I will follow-up with him in 2 weeks at which time hopefully he will be able to pick up his orthotics.

## 2018-12-21 NOTE — Progress Notes (Signed)
F/o not here yet

## 2018-12-27 NOTE — Progress Notes (Signed)
This encounter was created in error - please disregard.

## 2018-12-28 ENCOUNTER — Ambulatory Visit: Payer: Medicare Other | Admitting: Orthotics

## 2018-12-28 ENCOUNTER — Encounter: Payer: Self-pay | Admitting: Podiatry

## 2018-12-28 ENCOUNTER — Other Ambulatory Visit: Payer: Self-pay

## 2018-12-28 ENCOUNTER — Ambulatory Visit (INDEPENDENT_AMBULATORY_CARE_PROVIDER_SITE_OTHER): Payer: Medicare Other | Admitting: Podiatry

## 2018-12-28 VITALS — Temp 97.3°F

## 2018-12-28 DIAGNOSIS — G620 Drug-induced polyneuropathy: Secondary | ICD-10-CM

## 2018-12-28 DIAGNOSIS — Q828 Other specified congenital malformations of skin: Secondary | ICD-10-CM

## 2018-12-28 DIAGNOSIS — L97521 Non-pressure chronic ulcer of other part of left foot limited to breakdown of skin: Secondary | ICD-10-CM

## 2018-12-28 NOTE — Progress Notes (Signed)
He presents today for follow-up of his ulceration subsecond metatarsal of the left foot.  States that I really no think is any better than when I was trying new orthotics on just a few minutes ago it did feel much better.  Objective: Vital signs are stable he is alert and oriented x3 reactive hyperkeratotic lesion plantar aspect second metatarsal status post amputation second toe left foot demonstrates only the reactive hyperkeratotic tissue was debrided no erythema cellulitis drainage or odor no purulence no malodor.  Assessment: Chronic ulceration left foot.  Plan: Debrided ulcer today.  He was dispensed his orthotics today.  I will follow-up with him in about 3 weeks to trim his nails trimmed the ulceration and reevaluate orthotics.

## 2018-12-28 NOTE — Progress Notes (Signed)
Delivered f/o (2 pair) with 2nd met left offload.

## 2018-12-29 ENCOUNTER — Other Ambulatory Visit: Payer: Self-pay

## 2018-12-29 MED ORDER — LENALIDOMIDE 5 MG PO CAPS: ORAL_CAPSULE | ORAL | 0 refills | Status: DC

## 2019-01-12 ENCOUNTER — Inpatient Hospital Stay: Payer: Medicare Other | Attending: Hematology and Oncology

## 2019-01-12 ENCOUNTER — Inpatient Hospital Stay: Payer: Medicare Other

## 2019-01-12 ENCOUNTER — Other Ambulatory Visit: Payer: Self-pay

## 2019-01-12 ENCOUNTER — Inpatient Hospital Stay (HOSPITAL_BASED_OUTPATIENT_CLINIC_OR_DEPARTMENT_OTHER): Payer: Medicare Other | Admitting: Hematology and Oncology

## 2019-01-12 DIAGNOSIS — N183 Chronic kidney disease, stage 3 unspecified: Secondary | ICD-10-CM

## 2019-01-12 DIAGNOSIS — C9001 Multiple myeloma in remission: Secondary | ICD-10-CM

## 2019-01-12 DIAGNOSIS — Z79899 Other long term (current) drug therapy: Secondary | ICD-10-CM | POA: Diagnosis not present

## 2019-01-12 DIAGNOSIS — C61 Malignant neoplasm of prostate: Secondary | ICD-10-CM

## 2019-01-12 DIAGNOSIS — D61818 Other pancytopenia: Secondary | ICD-10-CM | POA: Insufficient documentation

## 2019-01-12 DIAGNOSIS — Z95828 Presence of other vascular implants and grafts: Secondary | ICD-10-CM

## 2019-01-12 DIAGNOSIS — C9000 Multiple myeloma not having achieved remission: Secondary | ICD-10-CM

## 2019-01-12 LAB — CBC WITH DIFFERENTIAL/PLATELET
Abs Immature Granulocytes: 0.01 10*3/uL (ref 0.00–0.07)
Basophils Absolute: 0.1 10*3/uL (ref 0.0–0.1)
Basophils Relative: 2 %
Eosinophils Absolute: 0.4 10*3/uL (ref 0.0–0.5)
Eosinophils Relative: 10 %
HCT: 37.5 % — ABNORMAL LOW (ref 39.0–52.0)
Hemoglobin: 12.5 g/dL — ABNORMAL LOW (ref 13.0–17.0)
Immature Granulocytes: 0 %
Lymphocytes Relative: 26 %
Lymphs Abs: 1 10*3/uL (ref 0.7–4.0)
MCH: 34 pg (ref 26.0–34.0)
MCHC: 33.3 g/dL (ref 30.0–36.0)
MCV: 101.9 fL — ABNORMAL HIGH (ref 80.0–100.0)
Monocytes Absolute: 0.4 10*3/uL (ref 0.1–1.0)
Monocytes Relative: 10 %
Neutro Abs: 2 10*3/uL (ref 1.7–7.7)
Neutrophils Relative %: 52 %
Platelets: 145 10*3/uL — ABNORMAL LOW (ref 150–400)
RBC: 3.68 MIL/uL — ABNORMAL LOW (ref 4.22–5.81)
RDW: 14.4 % (ref 11.5–15.5)
WBC: 3.8 10*3/uL — ABNORMAL LOW (ref 4.0–10.5)
nRBC: 0 % (ref 0.0–0.2)

## 2019-01-12 LAB — COMPREHENSIVE METABOLIC PANEL
ALT: 17 U/L (ref 0–44)
AST: 25 U/L (ref 15–41)
Albumin: 3.2 g/dL — ABNORMAL LOW (ref 3.5–5.0)
Alkaline Phosphatase: 44 U/L (ref 38–126)
Anion gap: 9 (ref 5–15)
BUN: 16 mg/dL (ref 8–23)
CO2: 24 mmol/L (ref 22–32)
Calcium: 8.7 mg/dL — ABNORMAL LOW (ref 8.9–10.3)
Chloride: 107 mmol/L (ref 98–111)
Creatinine, Ser: 1.26 mg/dL — ABNORMAL HIGH (ref 0.61–1.24)
GFR calc Af Amer: >60 mL/min (ref >60)
GFR calc non Af Amer: 56 mL/min — ABNORMAL LOW (ref >60)
Glucose, Bld: 116 mg/dL — ABNORMAL HIGH (ref 70–99)
Potassium: 3.7 mmol/L (ref 3.5–5.1)
Sodium: 140 mmol/L (ref 135–145)
Total Bilirubin: 0.6 mg/dL (ref 0.3–1.2)
Total Protein: 6.7 g/dL (ref 6.5–8.1)

## 2019-01-12 MED ORDER — ZOLEDRONIC ACID 4 MG/5ML IV CONC
3.5000 mg | Freq: Once | INTRAVENOUS | Status: AC
  Administered 2019-01-12: 3.5 mg via INTRAVENOUS
  Filled 2019-01-12: qty 4.38

## 2019-01-12 MED ORDER — SODIUM CHLORIDE 0.9 % IV SOLN
Freq: Once | INTRAVENOUS | Status: AC
  Administered 2019-01-12: 14:00:00 via INTRAVENOUS
  Filled 2019-01-12: qty 250

## 2019-01-12 MED ORDER — HEPARIN SOD (PORK) LOCK FLUSH 100 UNIT/ML IV SOLN
500.0000 [IU] | Freq: Once | INTRAVENOUS | Status: AC | PRN
  Administered 2019-01-12: 500 [IU] via INTRAVENOUS
  Filled 2019-01-12: qty 5

## 2019-01-12 MED ORDER — SODIUM CHLORIDE 0.9% FLUSH
10.0000 mL | INTRAVENOUS | Status: DC | PRN
  Administered 2019-01-12: 10 mL
  Filled 2019-01-12: qty 10

## 2019-01-12 NOTE — Patient Instructions (Signed)
Remember to chew two tums each day! Have a great weekend.  Zoledronic Acid injection (Hypercalcemia, Oncology) What is this medicine? ZOLEDRONIC ACID (ZOE le dron ik AS id) lowers the amount of calcium loss from bone. It is used to treat too much calcium in your blood from cancer. It is also used to prevent complications of cancer that has spread to the bone. This medicine may be used for other purposes; ask your health care provider or pharmacist if you have questions. COMMON BRAND NAME(S): Zometa What should I tell my health care provider before I take this medicine? They need to know if you have any of these conditions: -aspirin-sensitive asthma -cancer, especially if you are receiving medicines used to treat cancer -dental disease or wear dentures -infection -kidney disease -receiving corticosteroids like dexamethasone or prednisone -an unusual or allergic reaction to zoledronic acid, other medicines, foods, dyes, or preservatives -pregnant or trying to get pregnant -breast-feeding How should I use this medicine? This medicine is for infusion into a vein. It is given by a health care professional in a hospital or clinic setting. Talk to your pediatrician regarding the use of this medicine in children. Special care may be needed. Overdosage: If you think you have taken too much of this medicine contact a poison control center or emergency room at once. NOTE: This medicine is only for you. Do not share this medicine with others. What if I miss a dose? It is important not to miss your dose. Call your doctor or health care professional if you are unable to keep an appointment. What may interact with this medicine? -certain antibiotics given by injection -NSAIDs, medicines for pain and inflammation, like ibuprofen or naproxen -some diuretics like bumetanide, furosemide -teriparatide -thalidomide This list may not describe all possible interactions. Give your health care provider a list  of all the medicines, herbs, non-prescription drugs, or dietary supplements you use. Also tell them if you smoke, drink alcohol, or use illegal drugs. Some items may interact with your medicine. What should I watch for while using this medicine? Visit your doctor or health care professional for regular checkups. It may be some time before you see the benefit from this medicine. Do not stop taking your medicine unless your doctor tells you to. Your doctor may order blood tests or other tests to see how you are doing. Women should inform their doctor if they wish to become pregnant or think they might be pregnant. There is a potential for serious side effects to an unborn child. Talk to your health care professional or pharmacist for more information. You should make sure that you get enough calcium and vitamin D while you are taking this medicine. Discuss the foods you eat and the vitamins you take with your health care professional. Some people who take this medicine have severe bone, joint, and/or muscle pain. This medicine may also increase your risk for jaw problems or a broken thigh bone. Tell your doctor right away if you have severe pain in your jaw, bones, joints, or muscles. Tell your doctor if you have any pain that does not go away or that gets worse. Tell your dentist and dental surgeon that you are taking this medicine. You should not have major dental surgery while on this medicine. See your dentist to have a dental exam and fix any dental problems before starting this medicine. Take good care of your teeth while on this medicine. Make sure you see your dentist for regular follow-up appointments. What  side effects may I notice from receiving this medicine? Side effects that you should report to your doctor or health care professional as soon as possible: -allergic reactions like skin rash, itching or hives, swelling of the face, lips, or tongue -anxiety, confusion, or depression -breathing  problems -changes in vision -eye pain -feeling faint or lightheaded, falls -jaw pain, especially after dental work -mouth sores -muscle cramps, stiffness, or weakness -redness, blistering, peeling or loosening of the skin, including inside the mouth -trouble passing urine or change in the amount of urine Side effects that usually do not require medical attention (report to your doctor or health care professional if they continue or are bothersome): -bone, joint, or muscle pain -constipation -diarrhea -fever -hair loss -irritation at site where injected -loss of appetite -nausea, vomiting -stomach upset -trouble sleeping -trouble swallowing -weak or tired This list may not describe all possible side effects. Call your doctor for medical advice about side effects. You may report side effects to FDA at 1-800-FDA-1088. Where should I keep my medicine? This drug is given in a hospital or clinic and will not be stored at home. NOTE: This sheet is a summary. It may not cover all possible information. If you have questions about this medicine, talk to your doctor, pharmacist, or health care provider.  2019 Elsevier/Gold Standard (2013-12-09 14:19:39)

## 2019-01-13 ENCOUNTER — Encounter: Payer: Self-pay | Admitting: Hematology and Oncology

## 2019-01-13 LAB — KAPPA/LAMBDA LIGHT CHAINS
Kappa free light chain: 71.8 mg/L — ABNORMAL HIGH (ref 3.3–19.4)
Kappa, lambda light chain ratio: 1.47 (ref 0.26–1.65)
Lambda free light chains: 48.7 mg/L — ABNORMAL HIGH (ref 5.7–26.3)

## 2019-01-13 NOTE — Assessment & Plan Note (Signed)
I have reviewed documentation by his urologist His recent PSA is mildly elevated So far, he is not symptomatic He will continue hormonal manipulation treatment through his urologist However, if he progressed to the point that he needed additional therapy, we will discontinue Revlimid

## 2019-01-13 NOTE — Progress Notes (Signed)
[place] OFFICE PROGRESS NOTE  Patient Care Team: Jinny Sanders, MD as PCP - General Jeanann Lewandowsky, MD as Consulting Physician (Hematology and Oncology) Cathlean Marseilles, NP as Nurse Practitioner (Hematology and Oncology) Raynelle Bring, MD as Consulting Physician (Urology) Heath Lark, MD as Consulting Physician (Hematology and Oncology) [place], Romilda Garret, [place] as Consulting Physician (Podiatry) Morton Stall, Howell Rucks, NP as Nurse Practitioner (Pain Medicine) Druscilla Brownie, MD as Consulting Physician (Dermatology) Bryson Ha, OD as Consulting Physician (Optometry)  ASSESSMENT & PLAN:  Multiple myeloma in remission [facility]) He is currently on low-dose Revlimid as a form of maintenance treatment In the meantime, he will continue aspirin, calcium with vitamin D He denies recent dental issues. He will continue Zometa every 3 months, reduced dose due to renal function I warned the patient that if his prostate cancer progresses, we will discontinue Revlimid and focus on treatment for prostate cancer  Malignant neoplasm of prostate ([place]) I have reviewed documentation by his urologist His recent PSA is mildly elevated So far, he is not symptomatic He will continue hormonal manipulation treatment through his urologist However, if he progressed to the point that he needed additional therapy, we will discontinue Revlimid  Chronic kidney disease, stage III (moderate) He has mild stable CKD I emphasized the importance of adequate fluid hydration   Acquired pancytopenia ([place]) This is likely due to recent treatment. The patient denies recent history of bleeding such as epistaxis, hematuria or hematochezia. He is asymptomatic from the low platelet count. I will observe for now.   We will continue low-dose Revlimid as prescribed.   No orders of the defined types were placed in this encounter.   INTERVAL HISTORY: Please see below for problem oriented charting. He  returns for further follow-up He is being followed by urologist for prostate cancer So far, he tolerated treatment well and management of his hot flashes He denies recent infection, fever or chills No bone pain No recent dental issues while on Zometa  SUMMARY OF ONCOLOGIC HISTORY:  1. Velcade, Cytoxan, and Decadron on a weekly basis from 07/10/2011 through 10/23/2011; 2. High-dose chemotherapy on 11/23/2011, consisting of carmustine 600 mg IV. He received cytarabine 400 mg IV on 12/24/2011, 12/25/2011, 12/26/2011, and 12/27/2011. He received etoposide 300 mg IV daily from 12/24/2011 through 12/27/2011, four doses. He received melphalan 280 mg IV on 12/28/2011.  3. 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  Current therapy: -Revlimid 5 mg daily, 3 weeks on, 1 week off. Revlimid was started on 05/11/2012.  Zometa was started on 09/02/2012.  Dental clearance was obtained.  He was diagnosed with prostate cancer and received treatment through urology service  REVIEW OF SYSTEMS:   Constitutional: Denies fevers, chills or abnormal weight loss Eyes: Denies blurriness of vision Ears, nose, mouth, throat, and face: Denies mucositis or sore throat Respiratory: Denies cough, dyspnea or wheezes Cardiovascular: Denies palpitation, chest discomfort or lower extremity swelling Gastrointestinal:  Denies nausea, heartburn or change in bowel habits Skin: Denies abnormal skin rashes Lymphatics: Denies new lymphadenopathy or easy bruising Neurological:Denies numbness, tingling or new weaknesses Behavioral/Psych: Mood is stable, no new changes  All other systems were reviewed with the patient and are negative.  I have reviewed the past medical history, past surgical history, social history and family history with the patient and they are unchanged from previous note.  ALLERGIES:  is allergic to celebrex [celecoxib]; cymbalta [duloxetine hcl]; hydromorphone hcl; meloxicam; oxycontin  [oxycodone]; percocet [oxycodone-acetaminophen]; vicodin [hydrocodone-acetaminophen]; and lyrica [pregabalin].  MEDICATIONS:  Current Outpatient Medications  Medication Sig Dispense Refill  . aspirin 81 MG tablet Take 81 mg by mouth daily.    . B Complex Vitamins (VITAMIN-B COMPLEX) TABS Take by mouth.    . Calcium Carbonate-Vitamin D 600-400 MG-UNIT per tablet Take by mouth.    . cholecalciferol (VITAMIN D) 1000 UNITS tablet Take 1,000 Units by mouth daily.    . fentaNYL (DURAGESIC) 75 MCG/HR APP 1 PA EXT TO THE SKIN QOD    . hydrocortisone 2.5 % ointment APP AA BID    . lenalidomide (REVLIMID) 5 MG capsule Take one capsule by mouth daily for 21 days, then off 7 days. 21 capsule 0  . lidocaine-prilocaine (EMLA) cream Apply topically as needed. Apply to port site one hour before treatment and cover with plastic wrap 30 g 2  . mupirocin ointment (BACTROBAN) 2 % Apply to wound after soaking BID 30 g 1  . NONFORMULARY OR COMPOUNDED ITEM Baclofn/amitrp/ketamn cream topically to feet TID prn for neuropathy    . oxybutynin (DITROPAN) 5 MG tablet TK 1 T PO BID    . oxyCODONE (OXY IR/ROXICODONE) 5 MG immediate release tablet TK 1 T PO Q 4 H PRN P  0  . pantoprazole (PROTONIX) 40 MG tablet TAKE 1 TABLET BY MOUTH EVERY DAY 90 tablet 3  . pimecrolimus (ELIDEL) 1 % cream USE 1 APPLICATION ON THE SKIN BID PRN    . prednisoLONE acetate (PRED FORTE) 1 % ophthalmic suspension SHAKE LQ AND INT 1 GTT IN OU QID    . UNABLE TO FIND Apply topically.    . venlafaxine (EFFEXOR) 25 MG tablet Take by mouth.     Current Facility-Administered Medications  Medication Dose Route Frequency Provider Last Rate Last Dose  . 0.9 %  sodium chloride infusion  500 mL Intravenous Continuous Nandigam, Kavitha V, MD        PHYSICAL EXAMINATION: ECOG PERFORMANCE STATUS: 1 - Symptomatic but completely ambulatory  Vitals:   01/12/19 1233  BP: (!) 144/64  Pulse: 63  Resp: 18  Temp: 98.7 F (37.1 C)  SpO2: 100%   Filed  Weights   01/12/19 1233  Weight: 189 lb 6.4 oz (85.9 kg)    GENERAL:alert, no distress and comfortable SKIN: skin color, texture, turgor are normal, no rashes or significant lesions EYES: normal, Conjunctiva are pink and non-injected, sclera clear OROPHARYNX:no exudate, no erythema and lips, buccal mucosa, and tongue normal  NECK: supple, thyroid normal size, non-tender, without nodularity LYMPH:  no palpable lymphadenopathy in the cervical, axillary or inguinal LUNGS: clear to auscultation and percussion with normal breathing effort HEART: regular rate & rhythm and no murmurs and no lower extremity edema ABDOMEN:abdomen soft, non-tender and normal bowel sounds Musculoskeletal:no cyanosis of digits and no clubbing  NEURO: alert & oriented x 3 with fluent speech, no focal motor/sensory deficits  LABORATORY DATA:  I have reviewed the data as listed    Component Value Date/Time   NA 140 01/12/2019 1146   NA 139 05/14/2017 1145   K 3.7 01/12/2019 1146   K 3.8 05/14/2017 1145   CL 107 01/12/2019 1146   CL 108 (H) 09/30/2013 0945   CL 110 (H) 12/29/2012 1151   CO2 24 01/12/2019 1146   CO2 27 05/14/2017 1145   GLUCOSE 116 (H) 01/12/2019 1146   GLUCOSE 109 05/14/2017 1145   GLUCOSE 127 (H) 12/29/2012 1151   BUN 16 01/12/2019 1146   BUN 17.9 05/14/2017 1145   CREATININE 1.26 (H) 01/12/2019   1146   CREATININE 1.3 05/14/2017 1145   CALCIUM 8.7 (L) 01/12/2019 1146   CALCIUM 9.3 05/14/2017 1145   PROT 6.7 01/12/2019 1146   PROT 6.2 05/14/2017 1145   PROT 6.6 05/14/2017 1145   ALBUMIN 3.2 (L) 01/12/2019 1146   ALBUMIN 3.3 (L) 05/14/2017 1145   AST 25 01/12/2019 1146   AST 19 05/14/2017 1145   ALT 17 01/12/2019 1146   ALT 18 05/14/2017 1145   ALKPHOS 44 01/12/2019 1146   ALKPHOS 45 05/14/2017 1145   BILITOT 0.6 01/12/2019 1146   BILITOT 0.67 05/14/2017 1145   GFRNONAA 56 (L) 01/12/2019 1146   GFRNONAA >60 09/30/2013 0945   GFRAA >60 01/12/2019 1146   GFRAA >60 09/30/2013 0945     No results found for: SPEP, UPEP  Lab Results  Component Value Date   WBC 3.8 (L) 01/12/2019   NEUTROABS 2.0 01/12/2019   HGB 12.5 (L) 01/12/2019   HCT 37.5 (L) 01/12/2019   MCV 101.9 (H) 01/12/2019   PLT 145 (L) 01/12/2019      Chemistry      Component Value Date/Time   NA 140 01/12/2019 1146   NA 139 05/14/2017 1145   K 3.7 01/12/2019 1146   K 3.8 05/14/2017 1145   CL 107 01/12/2019 1146   CL 108 (H) 09/30/2013 0945   CL 110 (H) 12/29/2012 1151   CO2 24 01/12/2019 1146   CO2 27 05/14/2017 1145   BUN 16 01/12/2019 1146   BUN 17.9 05/14/2017 1145   CREATININE 1.26 (H) 01/12/2019 1146   CREATININE 1.3 05/14/2017 1145      Component Value Date/Time   CALCIUM 8.7 (L) 01/12/2019 1146   CALCIUM 9.3 05/14/2017 1145   ALKPHOS 44 01/12/2019 1146   ALKPHOS 45 05/14/2017 1145   AST 25 01/12/2019 1146   AST 19 05/14/2017 1145   ALT 17 01/12/2019 1146   ALT 18 05/14/2017 1145   BILITOT 0.6 01/12/2019 1146   BILITOT 0.67 05/14/2017 1145       All questions were answered. The patient knows to call the clinic with any problems, questions or concerns. No barriers to learning was detected.  I spent 15 minutes counseling the patient face to face. The total time spent in the appointment was 20 minutes and more than 50% was on counseling and review of test results   , MD 01/13/2019 7:51 AM  

## 2019-01-13 NOTE — Assessment & Plan Note (Signed)
He has mild stable CKD I emphasized the importance of adequate fluid hydration

## 2019-01-13 NOTE — Assessment & Plan Note (Signed)
He is currently on low-dose Revlimid as a form of maintenance treatment In the meantime, he will continue aspirin, calcium with vitamin D He denies recent dental issues. He will continue Zometa every 3 months, reduced dose due to renal function I warned the patient that if his prostate cancer progresses, we will discontinue Revlimid and focus on treatment for prostate cancer

## 2019-01-13 NOTE — Assessment & Plan Note (Signed)
This is likely due to recent treatment. The patient denies recent history of bleeding such as epistaxis, hematuria or hematochezia. He is asymptomatic from the low platelet count. I will observe for now.   We will continue low-dose Revlimid as prescribed.

## 2019-01-16 ENCOUNTER — Ambulatory Visit: Payer: Medicare Other | Admitting: Podiatry

## 2019-01-16 LAB — MULTIPLE MYELOMA PANEL, SERUM
Albumin SerPl Elph-Mcnc: 3.2 g/dL (ref 2.9–4.4)
Albumin/Glob SerPl: 1.2 (ref 0.7–1.7)
Alpha 1: 0.2 g/dL (ref 0.0–0.4)
Alpha2 Glob SerPl Elph-Mcnc: 0.8 g/dL (ref 0.4–1.0)
B-Globulin SerPl Elph-Mcnc: 0.7 g/dL (ref 0.7–1.3)
Gamma Glob SerPl Elph-Mcnc: 1.2 g/dL (ref 0.4–1.8)
Globulin, Total: 2.8 g/dL (ref 2.2–3.9)
IgA: 298 mg/dL (ref 61–437)
IgG (Immunoglobin G), Serum: 1333 mg/dL (ref 603–1613)
IgM (Immunoglobulin M), Srm: 24 mg/dL (ref 15–143)
Total Protein ELP: 6 g/dL (ref 6.0–8.5)

## 2019-01-17 ENCOUNTER — Other Ambulatory Visit: Payer: Self-pay

## 2019-01-17 ENCOUNTER — Telehealth: Payer: Self-pay

## 2019-01-17 MED ORDER — LENALIDOMIDE 5 MG PO CAPS: ORAL_CAPSULE | ORAL | 0 refills | Status: DC

## 2019-01-17 NOTE — Telephone Encounter (Signed)
Called and given below message. He verbalized understanding. 

## 2019-01-17 NOTE — Telephone Encounter (Signed)
-----   Message from Heath Lark, MD sent at 01/17/2019 10:37 AM EDT ----- Regarding: Myeloma panel is ok Let him know results are ok/normal

## 2019-01-18 ENCOUNTER — Other Ambulatory Visit: Payer: Self-pay

## 2019-01-18 ENCOUNTER — Ambulatory Visit (INDEPENDENT_AMBULATORY_CARE_PROVIDER_SITE_OTHER): Payer: Medicare Other | Admitting: Podiatry

## 2019-01-18 ENCOUNTER — Encounter: Payer: Self-pay | Admitting: Podiatry

## 2019-01-18 VITALS — Temp 98.1°F

## 2019-01-18 DIAGNOSIS — M79676 Pain in unspecified toe(s): Secondary | ICD-10-CM | POA: Diagnosis not present

## 2019-01-18 DIAGNOSIS — B351 Tinea unguium: Secondary | ICD-10-CM | POA: Diagnosis not present

## 2019-01-18 DIAGNOSIS — L97511 Non-pressure chronic ulcer of other part of right foot limited to breakdown of skin: Secondary | ICD-10-CM | POA: Diagnosis not present

## 2019-01-18 DIAGNOSIS — L97521 Non-pressure chronic ulcer of other part of left foot limited to breakdown of skin: Secondary | ICD-10-CM

## 2019-01-18 NOTE — Patient Instructions (Signed)
Pre-Operative Instructions  Congratulations, you have decided to take an important step towards improving your quality of life.  You can be assured that the doctors and staff at [facility] will be with you every step of the way.  Here are some important things you should know:  1. Plan to be at the surgery center/hospital at least 1 (one) hour prior to your scheduled time, unless otherwise directed by the surgical center/hospital staff.  You must have a responsible adult accompany you, remain during the surgery and drive you home.  Make sure you have directions to the surgical center/hospital to ensure you arrive on time. 2. If you are having surgery at [facility] or [facility] hospitals, you will need a copy of your medical history and physical form from your family physician within one month prior to the date of surgery. We will give you a form for your primary physician to complete.  3. We make every effort to accommodate the date you request for surgery.  However, there are times where surgery dates or times have to be moved.  We will contact you as soon as possible if a change in schedule is required.   4. No aspirin/ibuprofen for one week before surgery.  If you are on aspirin, any non-steroidal anti-inflammatory medications (Mobic, Aleve, Ibuprofen) should not be taken seven (7) days prior to your surgery.  You make take Tylenol for pain prior to surgery.  5. Medications - If you are taking daily heart and blood pressure medications, seizure, reflux, allergy, asthma, anxiety, pain or diabetes medications, make sure you notify the surgery center/hospital before the day of surgery so they can tell you which medications you should take or avoid the day of surgery. 6. No food or drink after midnight the night before surgery unless directed otherwise by surgical center/hospital staff. 7. No alcoholic beverages 24-hours prior to surgery.  No smoking 24-hours prior or 24-hours after  surgery. 8. Wear loose pants or shorts. They should be loose enough to fit over bandages, boots, and casts. 9. Don't wear slip-on shoes. Sneakers are preferred. 10. Bring your boot with you to the surgery center/hospital.  Also bring crutches or a walker if your physician has prescribed it for you.  If you do not have this equipment, it will be provided for you after surgery. 11. If you have not been contacted by the surgery center/hospital by the day before your surgery, call to confirm the date and time of your surgery. 12. Leave-time from work may vary depending on the type of surgery you have.  Appropriate arrangements should be made prior to surgery with your employer. 13. Prescriptions will be provided immediately following surgery by your doctor.  Fill these as soon as possible after surgery and take the medication as directed. Pain medications will not be refilled on weekends and must be approved by the doctor. 14. Remove nail polish on the operative foot and avoid getting pedicures prior to surgery. 15. Wash the night before surgery.  The night before surgery wash the foot and leg well with water and the antibacterial soap provided. Be sure to pay special attention to beneath the toenails and in between the toes.  Wash for at least three (3) minutes. Rinse thoroughly with water and dry well with a towel.  Perform this wash unless told not to do so by your physician.  Enclosed: 1 Ice pack (please put in freezer the night before surgery)   1 Hibiclens skin cleaner     Pre-op instructions  If you have any questions regarding the instructions, please do not hesitate to call our office.  [place]: [address], [place], [place] [address] -- [phone]  [place]: [address] [place], [place] [address] -- [phone]  [place]: [address]  [place], [place] [address] -- [phone]  [place]: [address], [address], [place], [place] [address] -- [phone]  Website:  [email or web address] 

## 2019-01-18 NOTE — Progress Notes (Signed)
He presents today for a follow-up of his plantar ulcer forefoot left.  He says I think these insoles might be a little tight help with one thing but hurting another.  He states that he has now multiple wounds on the feet most likely due to the inserts.  Objective: Vital signs are stable alert and oriented x3.  He has reactive hyperkeratotic ulceration plantar aspect of the second metatarsal from previous amputated toe second left.  He also has a superficial ulceration to the medial aspect of the first metatarsal phalangeal joint overlying the bunion deformity.  He also has a small area medial aspect of the second toe it with skin breakdown.  None of these are through to the dermis with exception of the ulceration.  Currently nothing appears to be infected.  Assessment: Chronic ulceration second metatarsal left foot.  Superficial wounds today secondary to tight shoes.  Plan: Liliane Channel removed some of the bulk from the inserts today.  I also debrided all reactive hyperkeratotic tissue.  He consented for a second metatarsal head removal since the toe is already been resected.  He understands the possible consequences with this and the fact that we can transfer lesions.  I am also going to monitor follow-up with him prior to surgery to make sure that these other wounds are healing.  We dispensed a Darco shoe for him today.  He will continue current treatment regimen.

## 2019-01-19 ENCOUNTER — Telehealth: Payer: Self-pay | Admitting: *Deleted

## 2019-01-19 NOTE — Telephone Encounter (Signed)
"  I'm calling about a surgery that I want to set up with Dr. Milinda Pointer."  Do you have a date that you would like?  "Dr. Milinda Pointer said to schedule it for two weeks out.  Can we schedule it for July 10?"  Dr. Milinda Pointer is not doing surgeries on July 10.  His next available date is July 24.  "That puts it out about a month.  Okay go ahead and put down for it."  I'll get it scheduled.  Someone from the surgical center will give you a call a day or two prior to your surgery date and will give you your arrival time.  You need to go online and register with the surgical center, instructions are in the brochure that we gave you.

## 2019-01-25 ENCOUNTER — Encounter: Payer: Self-pay | Admitting: Podiatry

## 2019-01-25 ENCOUNTER — Other Ambulatory Visit: Payer: Self-pay

## 2019-01-25 ENCOUNTER — Ambulatory Visit (INDEPENDENT_AMBULATORY_CARE_PROVIDER_SITE_OTHER): Payer: Medicare Other | Admitting: Podiatry

## 2019-01-25 VITALS — Temp 99.1°F

## 2019-01-25 DIAGNOSIS — M216X2 Other acquired deformities of left foot: Secondary | ICD-10-CM

## 2019-01-25 DIAGNOSIS — L97521 Non-pressure chronic ulcer of other part of left foot limited to breakdown of skin: Secondary | ICD-10-CM

## 2019-01-25 DIAGNOSIS — L97511 Non-pressure chronic ulcer of other part of right foot limited to breakdown of skin: Secondary | ICD-10-CM

## 2019-01-25 NOTE — Progress Notes (Signed)
He presents today for follow-up of ulceration plantar aspect of the left foot and superficial abrasions associated with tight shoe gear.  At this point he states he seems to be doing better.  Objective: Evaluation of both feet demonstrate well-healing ulcerative lesions.  I debrided the reactive hyperkeratotic lesion plantar aspect of the left foot.  Assessment: Well-healing ulcerations bilateral foot.  Plan: At this point he and I will follow-up with the child there in 2 weeks 1 week prior to his scheduled surgical removal of the second metatarsal head.

## 2019-02-08 ENCOUNTER — Other Ambulatory Visit: Payer: Self-pay

## 2019-02-08 ENCOUNTER — Encounter: Payer: Self-pay | Admitting: Podiatry

## 2019-02-08 ENCOUNTER — Ambulatory Visit (INDEPENDENT_AMBULATORY_CARE_PROVIDER_SITE_OTHER): Payer: Medicare Other | Admitting: Podiatry

## 2019-02-08 VITALS — Temp 97.9°F

## 2019-02-08 DIAGNOSIS — L97521 Non-pressure chronic ulcer of other part of left foot limited to breakdown of skin: Secondary | ICD-10-CM | POA: Diagnosis not present

## 2019-02-08 NOTE — Progress Notes (Signed)
He presents today for follow-up of ulceration to the plantar aspect of his left foot.  He is having surgery in 2 weeks for removal of the head of the second metatarsal.  He states that it seems to be doing better than it was.  Objective: Vital signs are stable alert and oriented x3.  Ulceration appears to be doing better on the plantar aspect of the left foot no signs of infection healing slowly.  Assessment: Well-healing ulceration secondary plantarflexed second metatarsal.  Plan: Debrided wound today redressed Vester compressive dressing follow-up with him in 2 weeks for surgery.

## 2019-02-11 ENCOUNTER — Other Ambulatory Visit: Payer: Self-pay | Admitting: Hematology and Oncology

## 2019-02-13 ENCOUNTER — Other Ambulatory Visit: Payer: Self-pay | Admitting: *Deleted

## 2019-02-13 MED ORDER — LENALIDOMIDE 5 MG PO CAPS: ORAL_CAPSULE | ORAL | 0 refills | Status: DC

## 2019-02-16 ENCOUNTER — Other Ambulatory Visit: Payer: Self-pay | Admitting: Podiatry

## 2019-02-16 MED ORDER — TRAMADOL HCL 50 MG PO TABS: 50.0000 mg | ORAL_TABLET | Freq: Three times a day (TID) | ORAL | 0 refills | Status: AC | PRN

## 2019-02-16 MED ORDER — ONDANSETRON HCL 4 MG PO TABS: 4.0000 mg | ORAL_TABLET | Freq: Three times a day (TID) | ORAL | 0 refills | Status: DC | PRN

## 2019-02-16 MED ORDER — CLINDAMYCIN HCL 150 MG PO CAPS: 150.0000 mg | ORAL_CAPSULE | Freq: Three times a day (TID) | ORAL | 0 refills | Status: DC

## 2019-02-17 ENCOUNTER — Encounter: Payer: Self-pay | Admitting: Podiatry

## 2019-02-20 ENCOUNTER — Telehealth: Payer: Self-pay | Admitting: *Deleted

## 2019-02-20 NOTE — Telephone Encounter (Signed)
"  Is this the [place] office?"  It is not, it's the [employer].  Can I help you with something?  "The person in the [place] office is probably at lunch.  "I'm calling about an appointment I'm scheduled for on Wednesday.  Do I still need to go see Dr. Milinda Pointer for that appointment?  I was supposed to have had surgery this past Friday."  I have already rescheduled your post-op appointments.  "When did you reschedule it to?"  You're scheduled for August 5 at 1 am.  "Okay, who do I talk to about setting up my surgery for this Friday?"  I've already taken care of that as well.  Your surgery is scheduled for this Friday.  "You've taken care of that too?  What time do I need to be there?"  Yes, I have already rescheduled your surgery to that date.  Someone from the surgical center will give you a call a day or two prior to your surgery date and will give you your arrival time.  "Okay, thank you for your help."

## 2019-02-22 ENCOUNTER — Other Ambulatory Visit: Payer: Medicare Other

## 2019-02-23 ENCOUNTER — Other Ambulatory Visit: Payer: Self-pay

## 2019-02-23 ENCOUNTER — Other Ambulatory Visit: Payer: Self-pay | Admitting: Podiatry

## 2019-02-23 ENCOUNTER — Inpatient Hospital Stay: Payer: Medicare Other | Attending: Hematology and Oncology

## 2019-02-23 DIAGNOSIS — Z452 Encounter for adjustment and management of vascular access device: Secondary | ICD-10-CM | POA: Insufficient documentation

## 2019-02-23 DIAGNOSIS — C9001 Multiple myeloma in remission: Secondary | ICD-10-CM | POA: Diagnosis present

## 2019-02-23 DIAGNOSIS — Z95828 Presence of other vascular implants and grafts: Secondary | ICD-10-CM

## 2019-02-23 MED ORDER — SODIUM CHLORIDE 0.9% FLUSH
10.0000 mL | INTRAVENOUS | Status: DC | PRN
  Administered 2019-02-23: 10 mL
  Filled 2019-02-23: qty 10

## 2019-02-23 MED ORDER — TRAMADOL HCL 50 MG PO TABS: 50.0000 mg | ORAL_TABLET | Freq: Four times a day (QID) | ORAL | 0 refills | Status: AC | PRN

## 2019-02-23 MED ORDER — HEPARIN SOD (PORK) LOCK FLUSH 100 UNIT/ML IV SOLN
500.0000 [IU] | Freq: Once | INTRAVENOUS | Status: AC | PRN
  Administered 2019-02-23: 10:00:00 500 [IU] via INTRAVENOUS
  Filled 2019-02-23: qty 5

## 2019-02-24 DIAGNOSIS — M21542 Acquired clubfoot, left foot: Secondary | ICD-10-CM | POA: Diagnosis not present

## 2019-03-01 ENCOUNTER — Other Ambulatory Visit: Payer: Self-pay

## 2019-03-01 ENCOUNTER — Ambulatory Visit (INDEPENDENT_AMBULATORY_CARE_PROVIDER_SITE_OTHER): Payer: Medicare Other | Admitting: Podiatry

## 2019-03-01 ENCOUNTER — Ambulatory Visit (INDEPENDENT_AMBULATORY_CARE_PROVIDER_SITE_OTHER): Payer: Medicare Other

## 2019-03-01 VITALS — Temp 97.7°F

## 2019-03-01 DIAGNOSIS — Z9889 Other specified postprocedural states: Secondary | ICD-10-CM

## 2019-03-01 DIAGNOSIS — M216X2 Other acquired deformities of left foot: Secondary | ICD-10-CM

## 2019-03-01 NOTE — Progress Notes (Signed)
He presents today for his first postop visit status post second metatarsal head resection date of surgery 02/17/2019 states that there is no pain.  Objective: Vital signs are stable he is alert oriented x3.  Pulses are palpable.  Incision site appears to be going on to heal uneventfully there is no erythema edema cellulitis drainage or odor radiographs demonstrate a resected head of the second metatarsal.  Assessment: Well-healing surgical foot x1 week.  Plan: Redressed today dressed a compressive dressing follow-up with him in about a week for surgical suture removal.

## 2019-03-08 ENCOUNTER — Other Ambulatory Visit: Payer: Self-pay

## 2019-03-08 ENCOUNTER — Ambulatory Visit (INDEPENDENT_AMBULATORY_CARE_PROVIDER_SITE_OTHER): Payer: Medicare Other | Admitting: Podiatry

## 2019-03-08 VITALS — Temp 98.1°F

## 2019-03-08 DIAGNOSIS — Z9889 Other specified postprocedural states: Secondary | ICD-10-CM

## 2019-03-08 NOTE — Progress Notes (Signed)
He presents today for follow-up of his second metatarsal head resection left foot.  States that is doing a whole lot better does not hurt anymore and very happy with it.  Objective: Vital signs are stable he is alert and oriented x3.  Pulses are palpable.  Dry sterile dressing was intact once removed demonstrates sutures are intact margins are well coapted well he had removed the sutures today Steri-Strips in the area.  There is no purulence no malodor no signs of infection.  Assessment: Well-healing surgical foot left.  Plan: Redressed [place] will start getting it wet over the next few days we will follow-up with him in 1 week to make sure as he is well.

## 2019-03-14 ENCOUNTER — Other Ambulatory Visit: Payer: Medicare Other

## 2019-03-15 ENCOUNTER — Ambulatory Visit (INDEPENDENT_AMBULATORY_CARE_PROVIDER_SITE_OTHER): Payer: Self-pay | Admitting: Podiatry

## 2019-03-15 ENCOUNTER — Ambulatory Visit (INDEPENDENT_AMBULATORY_CARE_PROVIDER_SITE_OTHER): Payer: Medicare Other

## 2019-03-15 ENCOUNTER — Other Ambulatory Visit: Payer: Self-pay

## 2019-03-15 ENCOUNTER — Encounter: Payer: Self-pay | Admitting: Podiatry

## 2019-03-15 VITALS — Temp 97.3°F

## 2019-03-15 DIAGNOSIS — Z9889 Other specified postprocedural states: Secondary | ICD-10-CM

## 2019-03-15 DIAGNOSIS — M216X2 Other acquired deformities of left foot: Secondary | ICD-10-CM

## 2019-03-15 NOTE — Progress Notes (Signed)
He presents today for postop visit #3 date of surgery February 17, 2019 status post metatarsal head resection second left.  He states that is doing absolutely great and ready to get back to my regular shoes now.  He denies fever chills nausea vomiting muscle aches and pains.  Objective: Presents today carrying his tennis shoe in his hand.  Darco shoe was intact once removed demonstrates well-healing surgical site and the ulcerative lesion plantar aspect of the second metatarsal head has since resolved with only some reactive hyperkeratosis that appears to be peeling at this time.  Assessment: Well-healing surgical foot left.  Plan: At this point we will follow-up with him in 2 to 4 weeks I will allow him to get back into his regular shoe he will watch for signs of skin breakdown.

## 2019-03-22 ENCOUNTER — Other Ambulatory Visit: Payer: Medicare Other

## 2019-03-24 ENCOUNTER — Other Ambulatory Visit: Payer: Self-pay

## 2019-03-24 MED ORDER — LENALIDOMIDE 5 MG PO CAPS: ORAL_CAPSULE | ORAL | 0 refills | Status: DC

## 2019-03-29 ENCOUNTER — Other Ambulatory Visit: Payer: Medicare Other

## 2019-04-04 NOTE — Progress Notes (Signed)
DOS  02/17/2019  Second metatarsal head resection left foot.

## 2019-04-05 ENCOUNTER — Other Ambulatory Visit: Payer: Medicare Other

## 2019-04-11 ENCOUNTER — Other Ambulatory Visit: Payer: Self-pay | Admitting: *Deleted

## 2019-04-11 MED ORDER — LENALIDOMIDE 5 MG PO CAPS: ORAL_CAPSULE | ORAL | 0 refills | Status: DC

## 2019-04-14 ENCOUNTER — Inpatient Hospital Stay: Payer: Medicare Other | Attending: Hematology and Oncology

## 2019-04-14 ENCOUNTER — Inpatient Hospital Stay: Payer: Medicare Other

## 2019-04-14 ENCOUNTER — Inpatient Hospital Stay (HOSPITAL_BASED_OUTPATIENT_CLINIC_OR_DEPARTMENT_OTHER): Payer: Medicare Other | Admitting: Hematology and Oncology

## 2019-04-14 ENCOUNTER — Encounter: Payer: Self-pay | Admitting: Hematology and Oncology

## 2019-04-14 ENCOUNTER — Other Ambulatory Visit: Payer: Self-pay | Admitting: Hematology and Oncology

## 2019-04-14 ENCOUNTER — Telehealth: Payer: Self-pay | Admitting: *Deleted

## 2019-04-14 ENCOUNTER — Other Ambulatory Visit: Payer: Self-pay

## 2019-04-14 DIAGNOSIS — C9001 Multiple myeloma in remission: Secondary | ICD-10-CM | POA: Insufficient documentation

## 2019-04-14 DIAGNOSIS — C61 Malignant neoplasm of prostate: Secondary | ICD-10-CM

## 2019-04-14 DIAGNOSIS — N183 Chronic kidney disease, stage 3 unspecified: Secondary | ICD-10-CM

## 2019-04-14 DIAGNOSIS — Z95828 Presence of other vascular implants and grafts: Secondary | ICD-10-CM

## 2019-04-14 DIAGNOSIS — Z79899 Other long term (current) drug therapy: Secondary | ICD-10-CM | POA: Diagnosis not present

## 2019-04-14 DIAGNOSIS — C9000 Multiple myeloma not having achieved remission: Secondary | ICD-10-CM

## 2019-04-14 DIAGNOSIS — D61818 Other pancytopenia: Secondary | ICD-10-CM | POA: Diagnosis not present

## 2019-04-14 DIAGNOSIS — Z9484 Stem cells transplant status: Secondary | ICD-10-CM | POA: Diagnosis not present

## 2019-04-14 LAB — CBC WITH DIFFERENTIAL/PLATELET
Abs Immature Granulocytes: 0.01 10*3/uL (ref 0.00–0.07)
Basophils Absolute: 0.1 10*3/uL (ref 0.0–0.1)
Basophils Relative: 1 %
Eosinophils Absolute: 0.3 10*3/uL (ref 0.0–0.5)
Eosinophils Relative: 5 %
HCT: 33.1 % — ABNORMAL LOW (ref 39.0–52.0)
Hemoglobin: 11.2 g/dL — ABNORMAL LOW (ref 13.0–17.0)
Immature Granulocytes: 0 %
Lymphocytes Relative: 21 %
Lymphs Abs: 1.2 10*3/uL (ref 0.7–4.0)
MCH: 33.8 pg (ref 26.0–34.0)
MCHC: 33.8 g/dL (ref 30.0–36.0)
MCV: 100 fL (ref 80.0–100.0)
Monocytes Absolute: 0.7 10*3/uL (ref 0.1–1.0)
Monocytes Relative: 12 %
Neutro Abs: 3.4 10*3/uL (ref 1.7–7.7)
Neutrophils Relative %: 61 %
Platelets: 122 10*3/uL — ABNORMAL LOW (ref 150–400)
RBC: 3.31 MIL/uL — ABNORMAL LOW (ref 4.22–5.81)
RDW: 14.6 % (ref 11.5–15.5)
WBC: 5.6 10*3/uL (ref 4.0–10.5)
nRBC: 0 % (ref 0.0–0.2)

## 2019-04-14 LAB — COMPREHENSIVE METABOLIC PANEL
ALT: 15 U/L (ref 0–44)
AST: 21 U/L (ref 15–41)
Albumin: 3.3 g/dL — ABNORMAL LOW (ref 3.5–5.0)
Alkaline Phosphatase: 53 U/L (ref 38–126)
Anion gap: 7 (ref 5–15)
BUN: 19 mg/dL (ref 8–23)
CO2: 26 mmol/L (ref 22–32)
Calcium: 8.6 mg/dL — ABNORMAL LOW (ref 8.9–10.3)
Chloride: 106 mmol/L (ref 98–111)
Creatinine, Ser: 1.29 mg/dL — ABNORMAL HIGH (ref 0.61–1.24)
GFR calc Af Amer: >60 mL/min (ref >60)
GFR calc non Af Amer: 55 mL/min — ABNORMAL LOW (ref >60)
Glucose, Bld: 122 mg/dL — ABNORMAL HIGH (ref 70–99)
Potassium: 3.4 mmol/L — ABNORMAL LOW (ref 3.5–5.1)
Sodium: 139 mmol/L (ref 135–145)
Total Bilirubin: 0.6 mg/dL (ref 0.3–1.2)
Total Protein: 6.6 g/dL (ref 6.5–8.1)

## 2019-04-14 MED ORDER — ZOLEDRONIC ACID 4 MG/5ML IV CONC
3.5000 mg | Freq: Once | INTRAVENOUS | Status: AC
  Administered 2019-04-14: 3.5 mg via INTRAVENOUS
  Filled 2019-04-14: qty 4.38

## 2019-04-14 MED ORDER — SODIUM CHLORIDE 0.9 % IV SOLN
INTRAVENOUS | Status: DC
  Administered 2019-04-14: 13:00:00 via INTRAVENOUS
  Filled 2019-04-14: qty 250

## 2019-04-14 MED ORDER — SODIUM CHLORIDE 0.9% FLUSH
10.0000 mL | INTRAVENOUS | Status: DC | PRN
  Administered 2019-04-14: 12:00:00 10 mL
  Filled 2019-04-14: qty 10

## 2019-04-14 MED ORDER — HEPARIN SOD (PORK) LOCK FLUSH 100 UNIT/ML IV SOLN
500.0000 [IU] | Freq: Once | INTRAVENOUS | Status: AC | PRN
  Administered 2019-04-14: 500 [IU] via INTRAVENOUS
  Filled 2019-04-14: qty 5

## 2019-04-14 MED ORDER — SODIUM CHLORIDE 0.9% FLUSH
10.0000 mL | Freq: Once | INTRAVENOUS | Status: AC
  Administered 2019-04-14: 10 mL via INTRAVENOUS
  Filled 2019-04-14: qty 10

## 2019-04-14 NOTE — Assessment & Plan Note (Signed)
His recent PSA was about 2.5 He has appointment to follow-up with urologist in December

## 2019-04-14 NOTE — Assessment & Plan Note (Signed)
He has mild stable CKD I emphasized the importance of adequate fluid hydration

## 2019-04-14 NOTE — Telephone Encounter (Signed)
Office note from today's visit faxed to Dr. Lucille Passy at the [facility].

## 2019-04-14 NOTE — Assessment & Plan Note (Signed)
This is likely due to recent treatment. The patient denies recent history of bleeding such as epistaxis, hematuria or hematochezia. He is asymptomatic from the low platelet count. I will observe for now.   We will continue low-dose Revlimid as prescribed.

## 2019-04-14 NOTE — Progress Notes (Signed)
[place] OFFICE PROGRESS NOTE  Patient Care Team: Jinny Sanders, MD as PCP - General Jeanann Lewandowsky, MD as Consulting Physician (Hematology and Oncology) Cathlean Marseilles, NP as Nurse Practitioner (Hematology and Oncology) Raynelle Bring, MD as Consulting Physician (Urology) Heath Lark, MD as Consulting Physician (Hematology and Oncology) Ennis, Romilda Garret, [place] as Consulting Physician (Podiatry) Morton Stall, Howell Rucks, NP as Nurse Practitioner (Pain Medicine) Druscilla Brownie, MD as Consulting Physician (Dermatology) Bryson Ha, OD as Consulting Physician (Optometry)  ASSESSMENT & PLAN:  Multiple myeloma in remission [facility]) He is currently on low-dose Revlimid as a form of maintenance treatment In the meantime, he will continue aspirin, calcium with vitamin D He denies recent dental issues. He will continue Zometa every 3 months, reduced dose due to renal function I warned the patient that if his prostate cancer progresses, we will discontinue Revlimid and focus on treatment for prostate cancer  Acquired pancytopenia ([place]) This is likely due to recent treatment. The patient denies recent history of bleeding such as epistaxis, hematuria or hematochezia. He is asymptomatic from the low platelet count. I will observe for now.   We will continue low-dose Revlimid as prescribed.  Chronic kidney disease, stage III (moderate) He has mild stable CKD I emphasized the importance of adequate fluid hydration   Malignant neoplasm of prostate ([place]) His recent PSA was about 2.5 He has appointment to follow-up with urologist in December   No orders of the defined types were placed in this encounter.   INTERVAL HISTORY: Please see below for problem oriented charting. He returns for treatment and follow-up He has received influenza vaccination 3 days ago He has recent good results from urology His prostate cancer is stable He denies worsening bone pain No recent  infection, fever or chills  SUMMARY OF ONCOLOGIC HISTORY:  1. Velcade, Cytoxan, and Decadron on a weekly basis from 07/10/2011 through 10/23/2011; 2. High-dose chemotherapy on 11/23/2011, consisting of carmustine 600 mg IV. He received cytarabine 400 mg IV on 12/24/2011, 12/25/2011, 12/26/2011, and 12/27/2011. He received etoposide 300 mg IV daily from 12/24/2011 through 12/27/2011, four doses. He received melphalan 280 mg IV on 12/28/2011.  3. 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  Current therapy: -Revlimid 5 mg daily, 3 weeks on, 1 week off. Revlimid was started on 05/11/2012.  Zometa was started on 09/02/2012.  Dental clearance was obtained.  He was diagnosed with prostate cancer and received treatment through urology service  REVIEW OF SYSTEMS:   Constitutional: Denies fevers, chills or abnormal weight loss Eyes: Denies blurriness of vision Ears, nose, mouth, throat, and face: Denies mucositis or sore throat Respiratory: Denies cough, dyspnea or wheezes Cardiovascular: Denies palpitation, chest discomfort or lower extremity swelling Gastrointestinal:  Denies nausea, heartburn or change in bowel habits Skin: Denies abnormal skin rashes Lymphatics: Denies new lymphadenopathy or easy bruising Neurological:Denies numbness, tingling or new weaknesses Behavioral/Psych: Mood is stable, no new changes  All other systems were reviewed with the patient and are negative.  I have reviewed the past medical history, past surgical history, social history and family history with the patient and they are unchanged from previous note.  ALLERGIES:  is allergic to celebrex [celecoxib]; cymbalta [duloxetine hcl]; hydromorphone hcl; meloxicam; oxycontin [oxycodone]; percocet [oxycodone-acetaminophen]; vicodin [hydrocodone-acetaminophen]; and lyrica [pregabalin].  MEDICATIONS:  Current Outpatient Medications  Medication Sig Dispense Refill  . aspirin 81 MG tablet Take 81 mg by mouth  daily.    . B Complex Vitamins (VITAMIN-B COMPLEX) TABS Take by  mouth.    . Calcium Carbonate-Vitamin D 600-400 MG-UNIT per tablet Take by mouth.    . cholecalciferol (VITAMIN D) 1000 UNITS tablet Take 1,000 Units by mouth daily.    . fentaNYL (DURAGESIC) 75 MCG/HR APP 1 PA EXT TO THE SKIN QOD    . hydrocortisone 2.5 % ointment APP AA BID    . lenalidomide (REVLIMID) 5 MG capsule Take one capsule by mouth daily for 21 days, then off 7 days. 21 capsule 0  . lidocaine-prilocaine (EMLA) cream Apply topically as needed. Apply to port site one hour before treatment and cover with plastic wrap 30 g 2  . NONFORMULARY OR COMPOUNDED ITEM Baclofn/amitrp/ketamn cream topically to feet TID prn for neuropathy    . ondansetron (ZOFRAN) 4 MG tablet Take 1 tablet (4 mg total) by mouth every 8 (eight) hours as needed. 20 tablet 0  . oxyCODONE (OXY IR/ROXICODONE) 5 MG immediate release tablet TK 1 T PO Q 4 H PRN P  0  . pantoprazole (PROTONIX) 40 MG tablet TAKE 1 TABLET BY MOUTH EVERY DAY 90 tablet 3  . venlafaxine (EFFEXOR) 25 MG tablet Take by mouth.     Current Facility-Administered Medications  Medication Dose Route Frequency Provider Last Rate Last Dose  . 0.9 %  sodium chloride infusion  500 mL Intravenous Continuous Nandigam, Venia Minks, MD       Facility-Administered Medications Ordered in Other Visits  Medication Dose Route Frequency Provider Last Rate Last Dose  . 0.9 %  sodium chloride infusion   Intravenous Continuous Heath Lark, MD   Stopped at 04/14/19 1404    PHYSICAL EXAMINATION: ECOG PERFORMANCE STATUS: 1 - Symptomatic but completely ambulatory  Vitals:   04/14/19 1207  BP: 122/64  Pulse: 62  Resp: 17  Temp: 97.8 F (36.6 C)  SpO2: 100%   Filed Weights   04/14/19 1207  Weight: 175 lb 14.4 oz (79.8 kg)    GENERAL:alert, no distress and comfortable SKIN: skin color, texture, turgor are normal, no rashes or significant lesions EYES: normal, Conjunctiva are pink and non-injected,  sclera clear OROPHARYNX:no exudate, no erythema and lips, buccal mucosa, and tongue normal  NECK: supple, thyroid normal size, non-tender, without nodularity LYMPH:  no palpable lymphadenopathy in the cervical, axillary or inguinal LUNGS: clear to auscultation and percussion with normal breathing effort HEART: regular rate & rhythm and no murmurs and no lower extremity edema ABDOMEN:abdomen soft, non-tender and normal bowel sounds Musculoskeletal:no cyanosis of digits and no clubbing  NEURO: alert & oriented x 3 with fluent speech, no focal motor/sensory deficits  LABORATORY DATA:  I have reviewed the data as listed    Component Value Date/Time   NA 139 04/14/2019 1142   NA 139 05/14/2017 1145   K 3.4 (L) 04/14/2019 1142   K 3.8 05/14/2017 1145   CL 106 04/14/2019 1142   CL 108 (H) 09/30/2013 0945   CL 110 (H) 12/29/2012 1151   CO2 26 04/14/2019 1142   CO2 27 05/14/2017 1145   GLUCOSE 122 (H) 04/14/2019 1142   GLUCOSE 109 05/14/2017 1145   GLUCOSE 127 (H) 12/29/2012 1151   BUN 19 04/14/2019 1142   BUN 17.9 05/14/2017 1145   CREATININE 1.29 (H) 04/14/2019 1142   CREATININE 1.3 05/14/2017 1145   CALCIUM 8.6 (L) 04/14/2019 1142   CALCIUM 9.3 05/14/2017 1145   PROT 6.6 04/14/2019 1142   PROT 6.2 05/14/2017 1145   PROT 6.6 05/14/2017 1145   ALBUMIN 3.3 (L) 04/14/2019 1142   ALBUMIN  3.3 (L) 05/14/2017 1145   AST 21 04/14/2019 1142   AST 19 05/14/2017 1145   ALT 15 04/14/2019 1142   ALT 18 05/14/2017 1145   ALKPHOS 53 04/14/2019 1142   ALKPHOS 45 05/14/2017 1145   BILITOT 0.6 04/14/2019 1142   BILITOT 0.67 05/14/2017 1145   GFRNONAA 55 (L) 04/14/2019 1142   GFRNONAA >60 09/30/2013 0945   GFRAA >60 04/14/2019 1142   GFRAA >60 09/30/2013 0945    No results found for: SPEP, UPEP  Lab Results  Component Value Date   WBC 5.6 04/14/2019   NEUTROABS 3.4 04/14/2019   HGB 11.2 (L) 04/14/2019   HCT 33.1 (L) 04/14/2019   MCV 100.0 04/14/2019   PLT 122 (L) 04/14/2019       Chemistry      Component Value Date/Time   NA 139 04/14/2019 1142   NA 139 05/14/2017 1145   K 3.4 (L) 04/14/2019 1142   K 3.8 05/14/2017 1145   CL 106 04/14/2019 1142   CL 108 (H) 09/30/2013 0945   CL 110 (H) 12/29/2012 1151   CO2 26 04/14/2019 1142   CO2 27 05/14/2017 1145   BUN 19 04/14/2019 1142   BUN 17.9 05/14/2017 1145   CREATININE 1.29 (H) 04/14/2019 1142   CREATININE 1.3 05/14/2017 1145      Component Value Date/Time   CALCIUM 8.6 (L) 04/14/2019 1142   CALCIUM 9.3 05/14/2017 1145   ALKPHOS 53 04/14/2019 1142   ALKPHOS 45 05/14/2017 1145   AST 21 04/14/2019 1142   AST 19 05/14/2017 1145   ALT 15 04/14/2019 1142   ALT 18 05/14/2017 1145   BILITOT 0.6 04/14/2019 1142   BILITOT 0.67 05/14/2017 1145      All questions were answered. The patient knows to call the clinic with any problems, questions or concerns. No barriers to learning was detected.  I spent 15 minutes counseling the patient face to face. The total time spent in the appointment was 20 minutes and more than 50% was on counseling and review of test results  Heath Lark, MD 04/14/2019 2:10 PM

## 2019-04-14 NOTE — Assessment & Plan Note (Signed)
He is currently on low-dose Revlimid as a form of maintenance treatment In the meantime, he will continue aspirin, calcium with vitamin D He denies recent dental issues. He will continue Zometa every 3 months, reduced dose due to renal function I warned the patient that if his prostate cancer progresses, we will discontinue Revlimid and focus on treatment for prostate cancer

## 2019-04-17 ENCOUNTER — Telehealth: Payer: Self-pay | Admitting: Hematology and Oncology

## 2019-04-17 LAB — MULTIPLE MYELOMA PANEL, SERUM
Albumin SerPl Elph-Mcnc: 3.3 g/dL (ref 2.9–4.4)
Albumin/Glob SerPl: 1.2 (ref 0.7–1.7)
Alpha 1: 0.2 g/dL (ref 0.0–0.4)
Alpha2 Glob SerPl Elph-Mcnc: 0.7 g/dL (ref 0.4–1.0)
B-Globulin SerPl Elph-Mcnc: 0.7 g/dL (ref 0.7–1.3)
Gamma Glob SerPl Elph-Mcnc: 1.4 g/dL (ref 0.4–1.8)
Globulin, Total: 2.9 g/dL (ref 2.2–3.9)
IgA: 314 mg/dL (ref 61–437)
IgG (Immunoglobin G), Serum: 1514 mg/dL (ref 603–1613)
IgM (Immunoglobulin M), Srm: 40 mg/dL (ref 15–143)
Total Protein ELP: 6.2 g/dL (ref 6.0–8.5)

## 2019-04-17 LAB — KAPPA/LAMBDA LIGHT CHAINS
Kappa free light chain: 98.7 mg/L — ABNORMAL HIGH (ref 3.3–19.4)
Kappa, lambda light chain ratio: 1.46 (ref 0.26–1.65)
Lambda free light chains: 67.4 mg/L — ABNORMAL HIGH (ref 5.7–26.3)

## 2019-04-17 NOTE — Telephone Encounter (Signed)
I left a message regarding schedule  

## 2019-04-19 ENCOUNTER — Ambulatory Visit (INDEPENDENT_AMBULATORY_CARE_PROVIDER_SITE_OTHER): Payer: Medicare Other | Admitting: Podiatry

## 2019-04-19 ENCOUNTER — Telehealth: Payer: Self-pay

## 2019-04-19 ENCOUNTER — Encounter: Payer: Self-pay | Admitting: Podiatry

## 2019-04-19 ENCOUNTER — Other Ambulatory Visit: Payer: Self-pay

## 2019-04-19 DIAGNOSIS — Q828 Other specified congenital malformations of skin: Secondary | ICD-10-CM | POA: Diagnosis not present

## 2019-04-19 DIAGNOSIS — M216X2 Other acquired deformities of left foot: Secondary | ICD-10-CM

## 2019-04-19 DIAGNOSIS — M79676 Pain in unspecified toe(s): Secondary | ICD-10-CM | POA: Diagnosis not present

## 2019-04-19 DIAGNOSIS — Z9889 Other specified postprocedural states: Secondary | ICD-10-CM | POA: Diagnosis not present

## 2019-04-19 DIAGNOSIS — B351 Tinea unguium: Secondary | ICD-10-CM | POA: Diagnosis not present

## 2019-04-19 NOTE — Progress Notes (Signed)
He presents today chief complaint of painfully elongated toenails and calluses bilateral.  Objective: Toenails are long thick yellow dystrophic clinically mycotic no open lesions or wounds.  Reactive hyper keratoma plantar aspect of the left foot.  Assessment: Pain in limb secondary to onychomycosis and porokeratosis.  Plan: Debrided all reactive hyperkeratotic tissue debrided toenails 1 through 5 due to onychomycosis.

## 2019-04-19 NOTE — Telephone Encounter (Signed)
-----   Message from Heath Lark, MD sent at 04/19/2019  8:16 AM EDT ----- Regarding: Myeloma panel is normal, pls let him know

## 2019-04-19 NOTE — Telephone Encounter (Signed)
Called and given below message. He verbalized understanding. 

## 2019-05-08 ENCOUNTER — Other Ambulatory Visit: Payer: Self-pay | Admitting: Surgery

## 2019-05-08 ENCOUNTER — Other Ambulatory Visit: Payer: Self-pay | Admitting: *Deleted

## 2019-05-08 MED ORDER — LENALIDOMIDE 5 MG PO CAPS: ORAL_CAPSULE | ORAL | 0 refills | Status: DC

## 2019-05-29 ENCOUNTER — Telehealth: Payer: Self-pay

## 2019-05-29 ENCOUNTER — Other Ambulatory Visit: Payer: Self-pay

## 2019-05-29 ENCOUNTER — Encounter (HOSPITAL_BASED_OUTPATIENT_CLINIC_OR_DEPARTMENT_OTHER): Payer: Self-pay | Admitting: *Deleted

## 2019-05-29 NOTE — Telephone Encounter (Signed)
He called and left a message. He is scheduled for surgery Monday for melanoma.  The surgery center wants him to stop the 81 mg Aspirin 3 days prior to surgery. Unless Dr. Alvy Bimler does not want him to stop.

## 2019-05-30 NOTE — Telephone Encounter (Signed)
Called and given below message. He verbalized understanding. Requesting port flush prior to Covid test this Thursday. Flush moved to thursday prior to test. He verbalized understanding.

## 2019-05-30 NOTE — Telephone Encounter (Signed)
OK to stop aspirin I suggest he also hold Revlimid

## 2019-06-01 ENCOUNTER — Other Ambulatory Visit (HOSPITAL_COMMUNITY)
Admission: RE | Admit: 2019-06-01 | Discharge: 2019-06-01 | Disposition: A | Discharge status: Home / Self Care | Payer: Medicare Other | Source: Ambulatory Visit | Attending: Surgery | Admitting: Surgery

## 2019-06-01 ENCOUNTER — Inpatient Hospital Stay: Payer: Medicare Other

## 2019-06-01 ENCOUNTER — Ambulatory Visit: Payer: Self-pay | Admitting: Surgery

## 2019-06-01 ENCOUNTER — Telehealth: Payer: Self-pay

## 2019-06-01 DIAGNOSIS — Z20828 Contact with and (suspected) exposure to other viral communicable diseases: Secondary | ICD-10-CM | POA: Insufficient documentation

## 2019-06-01 DIAGNOSIS — Z01812 Encounter for preprocedural laboratory examination: Secondary | ICD-10-CM | POA: Diagnosis present

## 2019-06-01 NOTE — Telephone Encounter (Signed)
He called to cancel port flush appt today and reschedule to 11/11 at 1:30.

## 2019-06-02 LAB — NOVEL CORONAVIRUS, NAA (HOSP ORDER, SEND-OUT TO REF LAB; TAT 18-24 HRS): SARS-CoV-2, NAA: NOT DETECTED

## 2019-06-04 NOTE — H&P (Signed)
Chief Complaint:  Melanoma behind the left ear  History of Present Illness:  [name] is an 73 y.o. male who had bx of nevus behind left ear and this was a .7 mm melanoma.  It was T1a and less than 1 mm and no sentinel node biopsy indicated.  He has had multiple skin biopsies for obvious solar damage to skin.  He is followed by Dr. Alvy Bimler for myeloma and Dr. Alinda Money for prostate cancer.    Past Medical History:  Diagnosis Date  . Allergic rhinitis   . Anemia 07/2010   secondary to tx rsponsive to [facility]  . Blood transfusion without reported diagnosis 2012, 2013  . 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 DDD (degenerative disc disease)   . Fractures    compression T12,L3  . GERD (gastroesophageal reflux disease)   . History of chemotherapy    weekly Velcade,Cytoxan  . HTN (hypertension)   . Hypercalcemia of malignancy   . Hyperlipemia   . Multiple myeloma 07/09/2011  . Multiple myeloma   . 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 Peripheral neuropathy    toes, sees Dr Krista Blue  . Prostate cancer ([place]) 2008   s/p prostatectomy  . Prostate cancer ([place]) 03/2002  . Renal insufficiency     Past Surgical History:  Procedure Laterality Date  . CATARACT EXTRACTION W/ INTRAOCULAR LENS IMPLANT Bilateral   . DEXA  05/2011   spine 0.7, L femur 0.0, R femur -0.6, normal  . KIDNEY STONE SURGERY    . KIDNEY STONE SURGERY  06/2009   Stone removed from bladder  . PORTACATH PLACEMENT  08/05/11   right sided portacath  . PROSTATECTOMY  04/06/07  . TOE AMPUTATION Left 2017   2nd toe left foot    Current Facility-Administered Medications  Medication Dose Route Frequency Provider Last Rate Last Dose  . 0.9 %  sodium chloride infusion  500 mL Intravenous Continuous Nandigam, Venia Minks, MD       Current Outpatient Medications  Medication Sig Dispense Refill  . aspirin 81 MG tablet Take 81 mg by mouth daily.    . B Complex Vitamins (VITAMIN-B COMPLEX) TABS Take  by mouth.    . Calcium Carbonate-Vitamin D 600-400 MG-UNIT per tablet Take by mouth.    . fentaNYL (DURAGESIC) 75 MCG/HR APP 1 PA EXT TO THE SKIN QOD    . lenalidomide (REVLIMID) 5 MG capsule Take one capsule by mouth daily for 21 days, then off 7 days. 21 capsule 0  . NONFORMULARY OR COMPOUNDED ITEM Baclofn/amitrp/ketamn cream topically to feet TID prn for neuropathy    . oxyCODONE (OXY IR/ROXICODONE) 5 MG immediate release tablet TK 1 T PO Q 4 H PRN P  0  . pantoprazole (PROTONIX) 40 MG tablet TAKE 1 TABLET BY MOUTH EVERY DAY 90 tablet 3  . venlafaxine (EFFEXOR) 25 MG tablet Take by mouth.    . cholecalciferol (VITAMIN D) 1000 UNITS tablet Take 1,000 Units by mouth daily.    . hydrocortisone 2.5 % ointment APP AA BID    . lidocaine-prilocaine (EMLA) cream Apply topically as needed. Apply to port site one hour before treatment and cover with plastic wrap 30 g 2   Celebrex [celecoxib], Cymbalta [duloxetine hcl], Hydromorphone hcl, Meloxicam, Oxycontin [oxycodone], Percocet [oxycodone-acetaminophen], Vicodin [hydrocodone-acetaminophen], and Lyrica [pregabalin] Family History  Problem Relation Age of Onset  . Pneumonia Mother   . Stroke Father   . Cancer Brother  lung  . Kidney cancer Sister   . Colon cancer Neg Hx    Social History:   reports that he quit smoking about 22 years ago. He has a 60.00 pack-year smoking history. He has never used smokeless tobacco. He reports that he does not drink alcohol or use drugs.   REVIEW OF SYSTEMS : Negative except for see problem list  Physical Exam:   Height _0  (1.854 m), weight 83.9 kg. Body mass index is 24.41 kg/m.  Gen:  WDWN WM NAD  Neurological: Alert and oriented to person, place, and time. Motor and sensory function is grossly intact  Head: Normocephalic and atraumatic.  Eyes: Conjunctivae are normal. Pupils are equal, round, and reactive to light. No scleral icterus.  Neck: Normal range of motion. Neck supple. No tracheal  deviation or thyromegaly present.  Cardiovascular:  SR without murmurs or gallops.  No carotid bruits Breast:  Not examined Respiratory: Effort normal.  No respiratory distress. No chest wall tenderness. Breath sounds normal.  No wheezes, rales or rhonchi.  Abdomen:  nontender GU:  Not examined Musculoskeletal: Normal range of motion. Extremities are nontender. No cyanosis, edema or clubbing noted Lymphadenopathy: No cervical, preauricular, postauricular or axillary adenopathy is present Skin: Significant solar damage.  There is a site of biopsy behind the left ear just in the hair line  Pscyh: Normal mood and affect. Behavior is normal. Judgment and thought content normal.   LABORATORY RESULTS: No results found. However, due to the size of the patient record, not all encounters were searched. Please check Results Review for a complete set of results.   RADIOLOGY RESULTS: No results found.  Problem List: Patient Active Problem List   Diagnosis Date Noted  . History of skin cancer 10/20/2018  . Preventive measure 05/14/2017  . Menopausal syndrome (hot flashes) 05/14/2017  . Tongue sore 10/30/2016  . History of multiple myeloma 10/19/2016  . 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/2016  . Port catheter in place 03/16/2016  . AK (actinic keratosis) 01/31/2016  . Chronic kidney disease, stage III (moderate) 12/13/2015  . Acquired pancytopenia ([place]) 09/06/2015  . Thrombocytopenia ([place]) 06/07/2015  . Chronic back pain 06/07/2015  . Leukopenia due to antineoplastic chemotherapy ([place]) 08/20/2014  . Neuropathy due to chemotherapeutic drug ([place]) 07/13/2014  . Peripheral neuropathy 03/02/2013  . Osteoporosis 10/28/2012  . Anemia of chronic disease 07/11/2011  . Multiple myeloma in remission ([place]) 07/09/2011  . Thrombocytopenia due to drugs 06/27/2011  . Lumbar compression fracture ([place]) 06/10/2011  . HYPERTRIGLYCERIDEMIA 08/18/2007  . Malignant neoplasm of  prostate ([place]) 02/23/2007  . OBESITY 11/18/2006  . Essential hypertension, benign 11/18/2006    Assessment & Plan: Melanoma behind the left ear;  For excision at CDS under general    [name] Done, MD, [facility] [phone] beeper [phone]  06/04/2019 8:00 AM

## 2019-06-05 ENCOUNTER — Ambulatory Visit (HOSPITAL_BASED_OUTPATIENT_CLINIC_OR_DEPARTMENT_OTHER): Payer: Medicare Other | Admitting: Anesthesiology

## 2019-06-05 ENCOUNTER — Ambulatory Visit (HOSPITAL_BASED_OUTPATIENT_CLINIC_OR_DEPARTMENT_OTHER)
Admission: RE | Admit: 2019-06-05 | Discharge: 2019-06-05 | Disposition: A | Discharge status: Home / Self Care | Payer: Medicare Other | Source: Ambulatory Visit | Attending: Surgery | Admitting: Surgery

## 2019-06-05 ENCOUNTER — Encounter (HOSPITAL_BASED_OUTPATIENT_CLINIC_OR_DEPARTMENT_OTHER): Payer: Self-pay | Admitting: Anesthesiology

## 2019-06-05 ENCOUNTER — Encounter (HOSPITAL_BASED_OUTPATIENT_CLINIC_OR_DEPARTMENT_OTHER)
Admission: RE | Disposition: A | Discharge status: Home / Self Care | Payer: Self-pay | Source: Ambulatory Visit | Attending: Surgery

## 2019-06-05 DIAGNOSIS — Z7982 Long term (current) use of aspirin: Secondary | ICD-10-CM | POA: Insufficient documentation

## 2019-06-05 DIAGNOSIS — Z9221 Personal history of antineoplastic chemotherapy: Secondary | ICD-10-CM | POA: Diagnosis not present

## 2019-06-05 DIAGNOSIS — C9001 Multiple myeloma in remission: Secondary | ICD-10-CM | POA: Insufficient documentation

## 2019-06-05 DIAGNOSIS — Z89422 Acquired absence of other left toe(s): Secondary | ICD-10-CM | POA: Diagnosis not present

## 2019-06-05 DIAGNOSIS — I129 Hypertensive chronic kidney disease with stage 1 through stage 4 chronic kidney disease, or unspecified chronic kidney disease: Secondary | ICD-10-CM | POA: Diagnosis not present

## 2019-06-05 DIAGNOSIS — K219 Gastro-esophageal reflux disease without esophagitis: Secondary | ICD-10-CM | POA: Insufficient documentation

## 2019-06-05 DIAGNOSIS — C4322 Malignant melanoma of left ear and external auricular canal: Secondary | ICD-10-CM | POA: Insufficient documentation

## 2019-06-05 DIAGNOSIS — Z8546 Personal history of malignant neoplasm of prostate: Secondary | ICD-10-CM | POA: Diagnosis not present

## 2019-06-05 DIAGNOSIS — Z79899 Other long term (current) drug therapy: Secondary | ICD-10-CM | POA: Insufficient documentation

## 2019-06-05 DIAGNOSIS — Z87891 Personal history of nicotine dependence: Secondary | ICD-10-CM | POA: Insufficient documentation

## 2019-06-05 DIAGNOSIS — N183 Chronic kidney disease, stage 3 unspecified: Secondary | ICD-10-CM | POA: Insufficient documentation

## 2019-06-05 HISTORY — PX: MELANOMA EXCISION: SHX5266

## 2019-06-05 SURGERY — EXCISION, MELANOMA
Anesthesia: General | Site: Neck | Laterality: Left

## 2019-06-05 MED ORDER — DEXAMETHASONE SODIUM PHOSPHATE 4 MG/ML IJ SOLN
INTRAMUSCULAR | Status: DC | PRN
  Administered 2019-06-05: 4 mg via INTRAVENOUS

## 2019-06-05 MED ORDER — CHLORHEXIDINE GLUCONATE CLOTH 2 % EX PADS: 6.0000 | MEDICATED_PAD | Freq: Once | CUTANEOUS | Status: DC

## 2019-06-05 MED ORDER — ONDANSETRON HCL 4 MG/2ML IJ SOLN: 4.0000 mg | Freq: Once | INTRAMUSCULAR | Status: DC | PRN

## 2019-06-05 MED ORDER — FENTANYL CITRATE (PF) 100 MCG/2ML IJ SOLN
INTRAMUSCULAR | Status: AC
  Filled 2019-06-05: qty 2

## 2019-06-05 MED ORDER — LIDOCAINE-EPINEPHRINE (PF) 1 %-1:200000 IJ SOLN
INTRAMUSCULAR | Status: DC | PRN
  Administered 2019-06-05: 7 mL

## 2019-06-05 MED ORDER — LIDOCAINE HCL (CARDIAC) PF 100 MG/5ML IV SOSY
PREFILLED_SYRINGE | INTRAVENOUS | Status: DC | PRN
  Administered 2019-06-05: 80 mg via INTRAVENOUS

## 2019-06-05 MED ORDER — ONDANSETRON HCL 4 MG/2ML IJ SOLN
INTRAMUSCULAR | Status: DC | PRN
  Administered 2019-06-05: 4 mg via INTRAVENOUS

## 2019-06-05 MED ORDER — PROPOFOL 10 MG/ML IV BOLUS
INTRAVENOUS | Status: DC | PRN
  Administered 2019-06-05: 120 mg via INTRAVENOUS

## 2019-06-05 MED ORDER — FENTANYL CITRATE (PF) 100 MCG/2ML IJ SOLN
50.0000 ug | INTRAMUSCULAR | Status: DC | PRN
  Administered 2019-06-05 (×2): 50 ug via INTRAVENOUS

## 2019-06-05 MED ORDER — LACTATED RINGERS IV SOLN
INTRAVENOUS | Status: DC
  Administered 2019-06-05: 14:00:00 via INTRAVENOUS

## 2019-06-05 MED ORDER — BUPIVACAINE HCL (PF) 0.5 % IJ SOLN
INTRAMUSCULAR | Status: AC
  Filled 2019-06-05: qty 30

## 2019-06-05 MED ORDER — BUPIVACAINE HCL (PF) 0.25 % IJ SOLN
INTRAMUSCULAR | Status: AC
  Filled 2019-06-05: qty 30

## 2019-06-05 MED ORDER — CEFAZOLIN SODIUM-DEXTROSE 2-4 GM/100ML-% IV SOLN
2.0000 g | INTRAVENOUS | Status: AC
  Administered 2019-06-05: 2 g via INTRAVENOUS

## 2019-06-05 MED ORDER — MIDAZOLAM HCL 2 MG/2ML IJ SOLN: 1.0000 mg | INTRAMUSCULAR | Status: DC | PRN

## 2019-06-05 MED ORDER — EPHEDRINE SULFATE 50 MG/ML IJ SOLN
INTRAMUSCULAR | Status: DC | PRN
  Administered 2019-06-05: 10 mg via INTRAVENOUS

## 2019-06-05 MED ORDER — ACETAMINOPHEN 10 MG/ML IV SOLN: 1000.0000 mg | Freq: Once | INTRAVENOUS | Status: DC | PRN

## 2019-06-05 MED ORDER — CEFAZOLIN SODIUM-DEXTROSE 2-4 GM/100ML-% IV SOLN
INTRAVENOUS | Status: AC
  Filled 2019-06-05: qty 100

## 2019-06-05 MED ORDER — HYDROCODONE-ACETAMINOPHEN 5-325 MG PO TABS: 1.0000 | ORAL_TABLET | Freq: Four times a day (QID) | ORAL | 0 refills | Status: DC | PRN

## 2019-06-05 MED ORDER — FENTANYL CITRATE (PF) 100 MCG/2ML IJ SOLN: 25.0000 ug | INTRAMUSCULAR | Status: DC | PRN

## 2019-06-05 MED ORDER — LIDOCAINE-EPINEPHRINE 1 %-1:100000 IJ SOLN
INTRAMUSCULAR | Status: AC
  Filled 2019-06-05: qty 1

## 2019-06-05 SURGICAL SUPPLY — 50 items
APL SKNCLS STERI-STRIP NONHPOA (GAUZE/BANDAGES/DRESSINGS) ×1
BENZOIN TINCTURE PRP APPL 2/3 (GAUZE/BANDAGES/DRESSINGS) ×3 IMPLANT
BLADE CLIPPER SURG (BLADE) ×2 IMPLANT
BLADE SURG 15 STRL LF DISP TIS (BLADE) ×1 IMPLANT
BLADE SURG 15 STRL SS (BLADE) ×3
BNDG CONFORM 3 STRL LF (GAUZE/BANDAGES/DRESSINGS) ×2 IMPLANT
CANISTER SUCT 1200ML W/VALVE (MISCELLANEOUS) IMPLANT
CLEANER CAUTERY TIP 5X5 PAD (MISCELLANEOUS) ×1 IMPLANT
CLOSURE WOUND 1/2 X4 (GAUZE/BANDAGES/DRESSINGS) ×1
COVER BACK TABLE REUSABLE LG (DRAPES) ×3 IMPLANT
COVER MAYO STAND REUSABLE (DRAPES) ×3 IMPLANT
COVER WAND RF STERILE (DRAPES) IMPLANT
DECANTER SPIKE VIAL GLASS SM (MISCELLANEOUS) ×3 IMPLANT
DRAPE LAPAROTOMY 100X72 PEDS (DRAPES) IMPLANT
DRAPE U-SHAPE 76X120 STRL (DRAPES) IMPLANT
ELECT REM PT RETURN 9FT ADLT (ELECTROSURGICAL) ×3
ELECTRODE REM PT RTRN 9FT ADLT (ELECTROSURGICAL) ×1 IMPLANT
GAUZE SPONGE 4X4 12PLY STRL LF (GAUZE/BANDAGES/DRESSINGS) ×3 IMPLANT
GLOVE BIO SURGEON STRL SZ 6.5 (GLOVE) ×1 IMPLANT
GLOVE BIO SURGEON STRL SZ8 (GLOVE) ×5 IMPLANT
GLOVE BIO SURGEONS STRL SZ 6.5 (GLOVE) ×1
GLOVE BIOGEL M STRL SZ7.5 (GLOVE) ×2 IMPLANT
GLOVE BIOGEL PI IND STRL 6.5 (GLOVE) IMPLANT
GLOVE BIOGEL PI INDICATOR 6.5 (GLOVE) ×2
GOWN STRL REUS W/ TWL LRG LVL3 (GOWN DISPOSABLE) ×1 IMPLANT
GOWN STRL REUS W/ TWL XL LVL3 (GOWN DISPOSABLE) ×1 IMPLANT
GOWN STRL REUS W/TWL LRG LVL3 (GOWN DISPOSABLE) ×3
GOWN STRL REUS W/TWL XL LVL3 (GOWN DISPOSABLE) ×9
NDL HYPO 25X1 1.5 SAFETY (NEEDLE) ×1 IMPLANT
NDL PRECISIONGLIDE 27X1.5 (NEEDLE) IMPLANT
NEEDLE HYPO 25X1 1.5 SAFETY (NEEDLE) ×3 IMPLANT
NEEDLE PRECISIONGLIDE 27X1.5 (NEEDLE) IMPLANT
NS IRRIG 1000ML POUR BTL (IV SOLUTION) ×2 IMPLANT
PACK BASIN DAY SURGERY FS (CUSTOM PROCEDURE TRAY) ×3 IMPLANT
PAD CLEANER CAUTERY TIP 5X5 (MISCELLANEOUS) ×2
PENCIL BUTTON HOLSTER BLD 10FT (ELECTRODE) ×3 IMPLANT
SLEEVE SCD COMPRESS KNEE MED (MISCELLANEOUS) IMPLANT
STRIP CLOSURE SKIN 1/2X4 (GAUZE/BANDAGES/DRESSINGS) ×2 IMPLANT
SUT ETHILON 4 0 PS 2 18 (SUTURE) ×2 IMPLANT
SUT ETHILON 5 0 PS 2 18 (SUTURE) IMPLANT
SUT VIC AB 4-0 SH 18 (SUTURE) ×3 IMPLANT
SUT VIC AB 5-0 PS2 18 (SUTURE) IMPLANT
SYR BULB 3OZ (MISCELLANEOUS) ×3 IMPLANT
SYR CONTROL 10ML LL (SYRINGE) ×3 IMPLANT
TOWEL GREEN STERILE FF (TOWEL DISPOSABLE) ×3 IMPLANT
TRAY DSU PREP LF (CUSTOM PROCEDURE TRAY) ×3 IMPLANT
TUBE CONNECTING 20'X1/4 (TUBING)
TUBE CONNECTING 20X1/4 (TUBING) IMPLANT
UNDERPAD 30X36 HEAVY ABSORB (UNDERPADS AND DIAPERS) IMPLANT
YANKAUER SUCT BULB TIP NO VENT (SUCTIONS) IMPLANT

## 2019-06-05 NOTE — Anesthesia Postprocedure Evaluation (Signed)
Anesthesia Post Note  Patient: [name]  Procedure(s) Performed: REEXCISION MELANOMA OF LEFT POSTERIOR AURICLE (Left Neck)     Patient location during evaluation: PACU Anesthesia Type: General Level of consciousness: awake Pain management: pain level controlled Respiratory status: spontaneous breathing Cardiovascular status: stable Postop Assessment: no apparent nausea or vomiting Anesthetic complications: no    Last Vitals:  Vitals:   06/05/19 1635 06/05/19 1645  BP:  132/77  Pulse: 77 69  Resp:  18  Temp:    SpO2: 100% 100%    Last Pain:  Vitals:   06/05/19 1645  TempSrc:   PainSc: 0-No pain                 Hogan Hoobler

## 2019-06-05 NOTE — Anesthesia Procedure Notes (Signed)
Procedure Name: LMA Insertion Date/Time: 06/05/2019 3:38 PM Performed by: Maryella Shivers, CRNA Pre-anesthesia Checklist: Patient identified, Emergency Drugs available, Suction available and Patient being monitored Patient Re-evaluated:Patient Re-evaluated prior to induction Oxygen Delivery Method: Circle system utilized Preoxygenation: Pre-oxygenation with 100% oxygen Induction Type: IV induction Ventilation: Mask ventilation without difficulty LMA: LMA inserted LMA Size: 5.0 Number of attempts: 1 Airway Equipment and Method: Bite block Placement Confirmation: positive ETCO2 Tube secured with: Tape Dental Injury: Teeth and Oropharynx as per pre-operative assessment

## 2019-06-05 NOTE — Anesthesia Preprocedure Evaluation (Signed)
Anesthesia Evaluation  Patient identified by MRN, date of birth, ID band Patient awake    Reviewed: Allergy & Precautions, NPO status , Patient's Chart, lab work & pertinent test results  Airway Mallampati: II  TM Distance: >3 FB Neck ROM: Full    Dental no notable dental hx. (+) Teeth Intact   Pulmonary neg pulmonary ROS, former smoker,    Pulmonary exam normal breath sounds clear to auscultation       Cardiovascular Exercise Tolerance: Good hypertension, Pt. on medications negative cardio ROS Normal cardiovascular exam Rhythm:Regular Rate:Normal     Neuro/Psych  Headaches,  Neuromuscular disease negative psych ROS   GI/Hepatic GERD  Medicated,  Endo/Other  negative endocrine ROS  Renal/GU Renal hypertensionRenal disease     Musculoskeletal  (+) Arthritis ,   Abdominal   Peds  Hematology  (+) anemia ,   Anesthesia Other Findings   Reproductive/Obstetrics                             Anesthesia Physical Anesthesia Plan  ASA: II  Anesthesia Plan: General   Post-op Pain Management:    Induction: Intravenous  PONV Risk Score and Plan: 3 and Treatment may vary due to age or medical condition, Ondansetron and Dexamethasone  Airway Management Planned: LMA  Additional Equipment:   Intra-op Plan:   Post-operative Plan:   Informed Consent:     Dental advisory given  Plan Discussed with: CRNA  Anesthesia Plan Comments:         Anesthesia Quick Evaluation

## 2019-06-05 NOTE — Anesthesia Postprocedure Evaluation (Signed)
Anesthesia Post Note  Patient: [name]  Procedure(s) Performed: REEXCISION MELANOMA OF LEFT POSTERIOR AURICLE (Left Neck)     Patient location during evaluation: PACU Anesthesia Type: General Level of consciousness: awake Pain management: pain level controlled Vital Signs Assessment: post-procedure vital signs reviewed and stable Respiratory status: spontaneous breathing Cardiovascular status: stable Postop Assessment: no apparent nausea or vomiting Anesthetic complications: no    Last Vitals:  Vitals:   06/05/19 1635 06/05/19 1645  BP:  132/77  Pulse: 77 69  Resp:  18  Temp:    SpO2: 100% 100%    Last Pain:  Vitals:   06/05/19 1645  TempSrc:   PainSc: 0-No pain                 Nelda Luckey

## 2019-06-05 NOTE — Interval H&P Note (Signed)
History and Physical Interval Note:  06/05/2019 3:22 PM  [name]  has presented today for surgery, with the diagnosis of MELANOMA 0.7MM THICK.  The various methods of treatment have been discussed with the patient and family. After consideration of risks, benefits and other options for treatment, the patient has consented to  Procedure(s): REEXCISION MELANOMA OF LEFT POSTERIOR AURICLE (Left) as a surgical intervention.  The patient's history has been reviewed, patient examined, no change in status, stable for surgery.  I have reviewed the patient's chart and labs.  Questions were answered to the patient's satisfaction.     Pedro Earls

## 2019-06-05 NOTE — Discharge Instructions (Signed)
May shower and shampoo in the morning.  Apply neosporin ointment to the incision daily.     Post Anesthesia Home Care Instructions  Activity: Get plenty of rest for the remainder of the day. A responsible individual must stay with you for 24 hours following the procedure.  For the next 24 hours, DO NOT: -Drive a car -Paediatric nurse -Drink alcoholic beverages -Take any medication unless instructed by your physician -Make any legal decisions or sign important papers.  Meals: Start with liquid foods such as gelatin or soup. Progress to regular foods as tolerated. Avoid greasy, spicy, heavy foods. If nausea and/or vomiting occur, drink only clear liquids until the nausea and/or vomiting subsides. Call your physician if vomiting continues.  Special Instructions/Symptoms: Your throat may feel dry or sore from the anesthesia or the breathing tube placed in your throat during surgery. If this causes discomfort, gargle with warm salt water. The discomfort should disappear within 24 hours.  If you had a scopolamine patch placed behind your ear for the management of post- operative nausea and/or vomiting:  1. The medication in the patch is effective for 72 hours, after which it should be removed.  Wrap patch in a tissue and discard in the trash. Wash hands thoroughly with soap and water. 2. You may remove the patch earlier than 72 hours if you experience unpleasant side effects which may include dry mouth, dizziness or visual disturbances. 3. Avoid touching the patch. Wash your hands with soap and water after contact with the patch.

## 2019-06-05 NOTE — Op Note (Signed)
[name]  [birth year]    PCP:  Jinny Sanders, MD   Surgeon: Kaylyn Lim, MD, FACS  Asst:  none  Anes:  general  Preop Dx: 0.7 mm of the left posterior auricular region Postop Dx: Same post reexcision  Procedure: Excision of melanoma of the left posterior ear Location Surgery: CDS 3 Complications: None noted  EBL:   minimal cc  Drains: none  Description of Procedure:  The patient was taken to OR 3 .  After anesthesia was administered and the patient was prepped  with Technicare and a timeout was performed.  An elliptical incion was described with ~ 1 cm margin.  The incision was made and the specimen market with one suture superior and two sutures posteriorly.  The excision was completed and specimen sent for permanents.  The incision was closed with 4-0 vicryl and a running 4-0 nylon.  Lidocaine was injected.    The patient tolerated the procedure well and was taken to the PACU in stable condition.     [name] Done, [place], [facility], [place]

## 2019-06-05 NOTE — Transfer of Care (Signed)
Immediate Anesthesia Transfer of Care Note  Patient: [name]  Procedure(s) Performed: REEXCISION MELANOMA OF LEFT POSTERIOR AURICLE (Left Neck)  Patient Location: PACU  Anesthesia Type:General  Level of Consciousness: awake, alert  and oriented  Airway & Oxygen Therapy: Patient Spontanous Breathing and Patient connected to face mask oxygen  Post-op Assessment: Report given to RN and Post -op Vital signs reviewed and stable  Post vital signs: Reviewed and stable  Last Vitals:  Vitals Value Taken Time  BP    Temp    Pulse 77 06/05/19 1635  Resp    SpO2 100 % 06/05/19 1635  Vitals shown include unvalidated device data.  Last Pain:  Vitals:   06/05/19 1405  TempSrc: Oral  PainSc: 3          Complications: No apparent anesthesia complications

## 2019-06-06 ENCOUNTER — Encounter (HOSPITAL_BASED_OUTPATIENT_CLINIC_OR_DEPARTMENT_OTHER): Payer: Self-pay | Admitting: Surgery

## 2019-06-07 ENCOUNTER — Other Ambulatory Visit: Payer: Self-pay

## 2019-06-07 ENCOUNTER — Inpatient Hospital Stay: Payer: Medicare Other | Attending: Hematology and Oncology

## 2019-06-07 DIAGNOSIS — C9001 Multiple myeloma in remission: Secondary | ICD-10-CM | POA: Insufficient documentation

## 2019-06-07 DIAGNOSIS — Z452 Encounter for adjustment and management of vascular access device: Secondary | ICD-10-CM | POA: Diagnosis present

## 2019-06-07 DIAGNOSIS — Z95828 Presence of other vascular implants and grafts: Secondary | ICD-10-CM

## 2019-06-07 MED ORDER — SODIUM CHLORIDE 0.9% FLUSH
10.0000 mL | INTRAVENOUS | Status: DC | PRN
  Administered 2019-06-07: 10 mL
  Filled 2019-06-07: qty 10

## 2019-06-07 MED ORDER — HEPARIN SOD (PORK) LOCK FLUSH 100 UNIT/ML IV SOLN
500.0000 [IU] | Freq: Once | INTRAVENOUS | Status: AC | PRN
  Administered 2019-06-07: 500 [IU] via INTRAVENOUS
  Filled 2019-06-07: qty 5

## 2019-06-08 LAB — SURGICAL PATHOLOGY

## 2019-06-16 ENCOUNTER — Other Ambulatory Visit: Payer: Self-pay | Admitting: Hematology and Oncology

## 2019-06-19 ENCOUNTER — Telehealth: Payer: Self-pay

## 2019-06-19 NOTE — Telephone Encounter (Signed)
He called and left a message to call him. He saw Dr. Luan Pulling at [facility] regarding doing back surgery. Dr. Luan Pulling wanted Dr. Alvy Bimler input.  Called back and given office fax # to have Dr. Luan Pulling fax over note. He verbalized understanding.

## 2019-06-20 NOTE — Telephone Encounter (Signed)
I tried calling Dr. Luan Pulling clinic I was put on hold for over 30 mins If you can get hold of him or nurse later please give him my cell phone number I can talk to him directly

## 2019-06-21 ENCOUNTER — Encounter: Payer: Self-pay | Admitting: Hematology and Oncology

## 2019-06-21 ENCOUNTER — Telehealth: Payer: Self-pay

## 2019-06-21 NOTE — Telephone Encounter (Signed)
Spoke with Laurena Slimmer, office manager for Dr Georgiann Cocker, to confirm he did receive my email with letter for Dr Georgiann Cocker.

## 2019-06-21 NOTE — Telephone Encounter (Signed)
Letter from Dr Alvy Bimler faxed to Dr Georgiann Cocker at [facility] [phone]

## 2019-06-21 NOTE — Telephone Encounter (Signed)
lvm at [phone] regarding need for fax number for Dr Georgiann Cocker.  Call return requested.

## 2019-06-30 ENCOUNTER — Other Ambulatory Visit: Payer: Self-pay | Admitting: *Deleted

## 2019-06-30 MED ORDER — LENALIDOMIDE 5 MG PO CAPS: ORAL_CAPSULE | ORAL | 0 refills | Status: DC

## 2019-07-12 ENCOUNTER — Ambulatory Visit (INDEPENDENT_AMBULATORY_CARE_PROVIDER_SITE_OTHER): Payer: Medicare Other | Admitting: Podiatry

## 2019-07-12 ENCOUNTER — Encounter: Payer: Self-pay | Admitting: Podiatry

## 2019-07-12 ENCOUNTER — Other Ambulatory Visit: Payer: Self-pay

## 2019-07-12 DIAGNOSIS — M79676 Pain in unspecified toe(s): Secondary | ICD-10-CM | POA: Diagnosis not present

## 2019-07-12 DIAGNOSIS — B351 Tinea unguium: Secondary | ICD-10-CM

## 2019-07-12 DIAGNOSIS — Q828 Other specified congenital malformations of skin: Secondary | ICD-10-CM

## 2019-07-12 NOTE — Progress Notes (Signed)
He presents today chief complaint of painfully elongated toenails bilaterally.  Objective: Pulses are palpable bilateral.  Toenails are long thick yellow dystrophic-like mycotic bilateral.  Assessment: Pain in limb secondary to onychomycosis.  Plan: Debridement of toenails bilaterally.

## 2019-07-14 ENCOUNTER — Other Ambulatory Visit: Payer: Self-pay

## 2019-07-14 ENCOUNTER — Inpatient Hospital Stay: Payer: Medicare Other | Attending: Hematology and Oncology

## 2019-07-14 ENCOUNTER — Inpatient Hospital Stay: Payer: Medicare Other

## 2019-07-14 ENCOUNTER — Encounter: Payer: Self-pay | Admitting: Hematology and Oncology

## 2019-07-14 ENCOUNTER — Inpatient Hospital Stay (HOSPITAL_BASED_OUTPATIENT_CLINIC_OR_DEPARTMENT_OTHER): Payer: Medicare Other | Admitting: Hematology and Oncology

## 2019-07-14 DIAGNOSIS — C9001 Multiple myeloma in remission: Secondary | ICD-10-CM

## 2019-07-14 DIAGNOSIS — D61818 Other pancytopenia: Secondary | ICD-10-CM | POA: Diagnosis not present

## 2019-07-14 DIAGNOSIS — Z85828 Personal history of other malignant neoplasm of skin: Secondary | ICD-10-CM | POA: Diagnosis not present

## 2019-07-14 DIAGNOSIS — N183 Chronic kidney disease, stage 3 unspecified: Secondary | ICD-10-CM

## 2019-07-14 DIAGNOSIS — Z95828 Presence of other vascular implants and grafts: Secondary | ICD-10-CM

## 2019-07-14 DIAGNOSIS — C9000 Multiple myeloma not having achieved remission: Secondary | ICD-10-CM

## 2019-07-14 LAB — COMPREHENSIVE METABOLIC PANEL
ALT: 20 U/L (ref 0–44)
AST: 21 U/L (ref 15–41)
Albumin: 3.2 g/dL — ABNORMAL LOW (ref 3.5–5.0)
Alkaline Phosphatase: 59 U/L (ref 38–126)
Anion gap: 9 (ref 5–15)
BUN: 20 mg/dL (ref 8–23)
CO2: 25 mmol/L (ref 22–32)
Calcium: 8.5 mg/dL — ABNORMAL LOW (ref 8.9–10.3)
Chloride: 107 mmol/L (ref 98–111)
Creatinine, Ser: 1.25 mg/dL — ABNORMAL HIGH (ref 0.61–1.24)
GFR calc Af Amer: >60 mL/min (ref >60)
GFR calc non Af Amer: 57 mL/min — ABNORMAL LOW (ref >60)
Glucose, Bld: 105 mg/dL — ABNORMAL HIGH (ref 70–99)
Potassium: 3.8 mmol/L (ref 3.5–5.1)
Sodium: 141 mmol/L (ref 135–145)
Total Bilirubin: 0.6 mg/dL (ref 0.3–1.2)
Total Protein: 6.5 g/dL (ref 6.5–8.1)

## 2019-07-14 LAB — CBC WITH DIFFERENTIAL/PLATELET
Abs Immature Granulocytes: 0 10*3/uL (ref 0.00–0.07)
Basophils Absolute: 0.1 10*3/uL (ref 0.0–0.1)
Basophils Relative: 2 %
Eosinophils Absolute: 0.7 10*3/uL — ABNORMAL HIGH (ref 0.0–0.5)
Eosinophils Relative: 16 %
HCT: 35.8 % — ABNORMAL LOW (ref 39.0–52.0)
Hemoglobin: 12 g/dL — ABNORMAL LOW (ref 13.0–17.0)
Immature Granulocytes: 0 %
Lymphocytes Relative: 27 %
Lymphs Abs: 1.1 10*3/uL (ref 0.7–4.0)
MCH: 33.7 pg (ref 26.0–34.0)
MCHC: 33.5 g/dL (ref 30.0–36.0)
MCV: 100.6 fL — ABNORMAL HIGH (ref 80.0–100.0)
Monocytes Absolute: 0.7 10*3/uL (ref 0.1–1.0)
Monocytes Relative: 17 %
Neutro Abs: 1.6 10*3/uL — ABNORMAL LOW (ref 1.7–7.7)
Neutrophils Relative %: 38 %
Platelets: 105 10*3/uL — ABNORMAL LOW (ref 150–400)
RBC: 3.56 MIL/uL — ABNORMAL LOW (ref 4.22–5.81)
RDW: 14.5 % (ref 11.5–15.5)
WBC: 4.2 10*3/uL (ref 4.0–10.5)
nRBC: 0 % (ref 0.0–0.2)

## 2019-07-14 MED ORDER — SODIUM CHLORIDE 0.9% FLUSH
10.0000 mL | INTRAVENOUS | Status: DC | PRN
  Administered 2019-07-14: 10 mL
  Filled 2019-07-14: qty 10

## 2019-07-14 MED ORDER — SODIUM CHLORIDE 0.9 % IV SOLN
INTRAVENOUS | Status: DC
  Filled 2019-07-14: qty 250

## 2019-07-14 MED ORDER — HEPARIN SOD (PORK) LOCK FLUSH 100 UNIT/ML IV SOLN
500.0000 [IU] | Freq: Once | INTRAVENOUS | Status: AC | PRN
  Administered 2019-07-14: 500 [IU] via INTRAVENOUS
  Filled 2019-07-14: qty 5

## 2019-07-14 MED ORDER — ZOLEDRONIC ACID 4 MG/5ML IV CONC
3.5000 mg | Freq: Once | INTRAVENOUS | Status: AC
  Administered 2019-07-14: 3.5 mg via INTRAVENOUS
  Filled 2019-07-14: qty 4.38

## 2019-07-14 NOTE — Assessment & Plan Note (Signed)
We discussed the importance of aggressive sun protection He will continue close follow-up with dermatologist

## 2019-07-14 NOTE — Assessment & Plan Note (Signed)
This is likely due to recent treatment. The patient denies recent history of bleeding such as epistaxis, hematuria or hematochezia. He is asymptomatic from the low platelet count. I will observe for now.   We will continue low-dose Revlimid as prescribed.

## 2019-07-14 NOTE — Assessment & Plan Note (Signed)
He has mild stable CKD I emphasized the importance of adequate fluid hydration

## 2019-07-14 NOTE — Progress Notes (Signed)
[place] OFFICE PROGRESS NOTE  Patient Care Team: Jinny Sanders, MD as PCP - General Jeanann Lewandowsky, MD as Consulting Physician (Hematology and Oncology) Cathlean Marseilles, NP as Nurse Practitioner (Hematology and Oncology) Raynelle Bring, MD as Consulting Physician (Urology) Heath Lark, MD as Consulting Physician (Hematology and Oncology) [place], Romilda Garret, [place] as Consulting Physician (Podiatry) Morton Stall, Howell Rucks, NP as Nurse Practitioner (Pain Medicine) Druscilla Brownie, MD as Consulting Physician (Dermatology) Bryson Ha, OD as Consulting Physician (Optometry)  ASSESSMENT & PLAN:  Multiple myeloma in remission [facility]) He is currently on low-dose Revlimid as a form of maintenance treatment In the meantime, he will continue aspirin, calcium with vitamin D He denies recent dental issues. He will continue Zometa every 3 months, reduced dose due to renal function  Acquired pancytopenia ([place]) This is likely due to recent treatment. The patient denies recent history of bleeding such as epistaxis, hematuria or hematochezia. He is asymptomatic from the low platelet count. I will observe for now.   We will continue low-dose Revlimid as prescribed.  History of skin cancer We discussed the importance of aggressive sun protection He will continue close follow-up with dermatologist  Chronic kidney disease, stage III (moderate) He has mild stable CKD I emphasized the importance of adequate fluid hydration    No orders of the defined types were placed in this encounter.   INTERVAL HISTORY: Please see below for problem oriented charting. He returns for further follow-up He had recent skin surgery for melanoma in situ He denies urination issues Tolerated Revlimid well. He denies recent infection, fever or chills No new bone pain Denies recent dental issues  SUMMARY OF ONCOLOGIC HISTORY:  1. Velcade, Cytoxan, and Decadron on a weekly basis from 07/10/2011  through 10/23/2011; 2. High-dose chemotherapy on 11/23/2011, consisting of carmustine 600 mg IV. He received cytarabine 400 mg IV on 12/24/2011, 12/25/2011, 12/26/2011, and 12/27/2011. He received etoposide 300 mg IV daily from 12/24/2011 through 12/27/2011, four doses. He received melphalan 280 mg IV on 12/28/2011.  3. 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  Current therapy: -Revlimid 5 mg daily, 3 weeks on, 1 week off. Revlimid was started on 05/11/2012.  Zometa was started on 09/02/2012.  Dental clearance was obtained.  He was diagnosed with prostate cancer and received treatment through urology service  REVIEW OF SYSTEMS:   Constitutional: Denies fevers, chills or abnormal weight loss Eyes: Denies blurriness of vision Ears, nose, mouth, throat, and face: Denies mucositis or sore throat Respiratory: Denies cough, dyspnea or wheezes Cardiovascular: Denies palpitation, chest discomfort or lower extremity swelling Gastrointestinal:  Denies nausea, heartburn or change in bowel habits Skin: Denies abnormal skin rashes Lymphatics: Denies new lymphadenopathy or easy bruising Neurological:Denies numbness, tingling or new weaknesses Behavioral/Psych: Mood is stable, no new changes  All other systems were reviewed with the patient and are negative.  I have reviewed the past medical history, past surgical history, social history and family history with the patient and they are unchanged from previous note.  ALLERGIES:  is allergic to celebrex [celecoxib]; cymbalta [duloxetine hcl]; hydromorphone hcl; meloxicam; oxycontin [oxycodone]; percocet [oxycodone-acetaminophen]; vicodin [hydrocodone-acetaminophen]; and lyrica [pregabalin].  MEDICATIONS:  Current Outpatient Medications  Medication Sig Dispense Refill  . aspirin 81 MG tablet Take 81 mg by mouth daily.    . B Complex Vitamins (VITAMIN-B COMPLEX) TABS Take by mouth.    . Calcium Carbonate-Vitamin D 600-400 MG-UNIT per  tablet Take by mouth.    . cholecalciferol (VITAMIN D) 1000  UNITS tablet Take 1,000 Units by mouth daily.    . fentaNYL (DURAGESIC) 75 MCG/HR APP 1 PA EXT TO THE SKIN QOD    . hydrocortisone 2.5 % ointment APP AA BID    . lenalidomide (REVLIMID) 5 MG capsule Take 1 capsule by mouth every day for 21 days then 7 days off. 21 capsule 0  . lidocaine-prilocaine (EMLA) cream Apply topically as needed. Apply to port site one hour before treatment and cover with plastic wrap 30 g 2  . NONFORMULARY OR COMPOUNDED ITEM Baclofn/amitrp/ketamn cream topically to feet TID prn for neuropathy    . oxybutynin (DITROPAN) 5 MG tablet Take 5 mg by mouth 2 (two) times daily.    Marland Kitchen oxyCODONE (OXY IR/ROXICODONE) 5 MG immediate release tablet TK 1 T PO Q 4 H PRN P  0  . pantoprazole (PROTONIX) 40 MG tablet TAKE 1 TABLET BY MOUTH EVERY DAY 90 tablet 3   No current facility-administered medications for this visit.   Facility-Administered Medications Ordered in Other Visits  Medication Dose Route Frequency Provider Last Rate Last Admin  . 0.9 %  sodium chloride infusion   Intravenous Continuous Heath Lark, MD 20 mL/hr at 07/14/19 1331 New Bag at 07/14/19 1331  . heparin lock flush 100 unit/mL  500 Units Intravenous Once PRN Alvy Bimler, Chazz Philson, MD      . sodium chloride flush (NS) 0.9 % injection 10 mL  10 mL Intracatheter PRN Alvy Bimler, Estera Ozier, MD      . zolendronic acid (ZOMETA) 3.5 mg in sodium chloride 0.9 % 100 mL IVPB  3.5 mg Intravenous Once Alvy Bimler, Calden Dorsey, MD        PHYSICAL EXAMINATION: ECOG PERFORMANCE STATUS: 1 - Symptomatic but completely ambulatory  Vitals:   07/14/19 1156  BP: (!) 117/59  Pulse: 65  Resp: 18  Temp: 97.8 F (36.6 C)  SpO2: 100%   Filed Weights   07/14/19 1156  Weight: 179 lb 1.6 oz (81.2 kg)    GENERAL:alert, no distress and comfortable SKIN: skin color, texture, turgor are normal, no rashes or significant lesions EYES: normal, Conjunctiva are pink and non-injected, sclera  clear OROPHARYNX:no exudate, no erythema and lips, buccal mucosa, and tongue normal  NECK: supple, thyroid normal size, non-tender, without nodularity LYMPH:  no palpable lymphadenopathy in the cervical, axillary or inguinal LUNGS: clear to auscultation and percussion with normal breathing effort HEART: regular rate & rhythm and no murmurs and no lower extremity edema ABDOMEN:abdomen soft, non-tender and normal bowel sounds Musculoskeletal:no cyanosis of digits and no clubbing  NEURO: alert & oriented x 3 with fluent speech, no focal motor/sensory deficits  LABORATORY DATA:  I have reviewed the data as listed    Component Value Date/Time   NA 141 07/14/2019 1141   NA 139 05/14/2017 1145   K 3.8 07/14/2019 1141   K 3.8 05/14/2017 1145   CL 107 07/14/2019 1141   CL 108 (H) 09/30/2013 0945   CL 110 (H) 12/29/2012 1151   CO2 25 07/14/2019 1141   CO2 27 05/14/2017 1145   GLUCOSE 105 (H) 07/14/2019 1141   GLUCOSE 109 05/14/2017 1145   GLUCOSE 127 (H) 12/29/2012 1151   BUN 20 07/14/2019 1141   BUN 17.9 05/14/2017 1145   CREATININE 1.25 (H) 07/14/2019 1141   CREATININE 1.3 05/14/2017 1145   CALCIUM 8.5 (L) 07/14/2019 1141   CALCIUM 9.3 05/14/2017 1145   PROT 6.5 07/14/2019 1141   PROT 6.2 05/14/2017 1145   PROT 6.6 05/14/2017 1145   ALBUMIN  3.2 (L) 07/14/2019 1141   ALBUMIN 3.3 (L) 05/14/2017 1145   AST 21 07/14/2019 1141   AST 19 05/14/2017 1145   ALT 20 07/14/2019 1141   ALT 18 05/14/2017 1145   ALKPHOS 59 07/14/2019 1141   ALKPHOS 45 05/14/2017 1145   BILITOT 0.6 07/14/2019 1141   BILITOT 0.67 05/14/2017 1145   GFRNONAA 57 (L) 07/14/2019 1141   GFRNONAA >60 09/30/2013 0945   GFRAA >60 07/14/2019 1141   GFRAA >60 09/30/2013 0945    No results found for: SPEP, UPEP  Lab Results  Component Value Date   WBC 4.2 07/14/2019   NEUTROABS 1.6 (L) 07/14/2019   HGB 12.0 (L) 07/14/2019   HCT 35.8 (L) 07/14/2019   MCV 100.6 (H) 07/14/2019   PLT 105 (L) 07/14/2019       Chemistry      Component Value Date/Time   NA 141 07/14/2019 1141   NA 139 05/14/2017 1145   K 3.8 07/14/2019 1141   K 3.8 05/14/2017 1145   CL 107 07/14/2019 1141   CL 108 (H) 09/30/2013 0945   CL 110 (H) 12/29/2012 1151   CO2 25 07/14/2019 1141   CO2 27 05/14/2017 1145   BUN 20 07/14/2019 1141   BUN 17.9 05/14/2017 1145   CREATININE 1.25 (H) 07/14/2019 1141   CREATININE 1.3 05/14/2017 1145      Component Value Date/Time   CALCIUM 8.5 (L) 07/14/2019 1141   CALCIUM 9.3 05/14/2017 1145   ALKPHOS 59 07/14/2019 1141   ALKPHOS 45 05/14/2017 1145   AST 21 07/14/2019 1141   AST 19 05/14/2017 1145   ALT 20 07/14/2019 1141   ALT 18 05/14/2017 1145   BILITOT 0.6 07/14/2019 1141   BILITOT 0.67 05/14/2017 1145      All questions were answered. The patient knows to call the clinic with any problems, questions or concerns. No barriers to learning was detected.  I spent 15 minutes counseling the patient face to face. The total time spent in the appointment was 20 minutes and more than 50% was on counseling and review of test results  Heath Lark, MD 07/14/2019 1:49 PM

## 2019-07-14 NOTE — Assessment & Plan Note (Signed)
He is currently on low-dose Revlimid as a form of maintenance treatment In the meantime, he will continue aspirin, calcium with vitamin D He denies recent dental issues. He will continue Zometa every 3 months, reduced dose due to renal function

## 2019-07-14 NOTE — Patient Instructions (Signed)
Remember to chew two tums each day! Have a great weekend.  Zoledronic Acid injection (Hypercalcemia, Oncology) What is this medicine? ZOLEDRONIC ACID (ZOE le dron ik AS id) lowers the amount of calcium loss from bone. It is used to treat too much calcium in your blood from cancer. It is also used to prevent complications of cancer that has spread to the bone. This medicine may be used for other purposes; ask your health care provider or pharmacist if you have questions. COMMON BRAND NAME(S): Zometa What should I tell my health care provider before I take this medicine? They need to know if you have any of these conditions: -aspirin-sensitive asthma -cancer, especially if you are receiving medicines used to treat cancer -dental disease or wear dentures -infection -kidney disease -receiving corticosteroids like dexamethasone or prednisone -an unusual or allergic reaction to zoledronic acid, other medicines, foods, dyes, or preservatives -pregnant or trying to get pregnant -breast-feeding How should I use this medicine? This medicine is for infusion into a vein. It is given by a health care professional in a hospital or clinic setting. Talk to your pediatrician regarding the use of this medicine in children. Special care may be needed. Overdosage: If you think you have taken too much of this medicine contact a poison control center or emergency room at once. NOTE: This medicine is only for you. Do not share this medicine with others. What if I miss a dose? It is important not to miss your dose. Call your doctor or health care professional if you are unable to keep an appointment. What may interact with this medicine? -certain antibiotics given by injection -NSAIDs, medicines for pain and inflammation, like ibuprofen or naproxen -some diuretics like bumetanide, furosemide -teriparatide -thalidomide This list may not describe all possible interactions. Give your health care provider a list  of all the medicines, herbs, non-prescription drugs, or dietary supplements you use. Also tell them if you smoke, drink alcohol, or use illegal drugs. Some items may interact with your medicine. What should I watch for while using this medicine? Visit your doctor or health care professional for regular checkups. It may be some time before you see the benefit from this medicine. Do not stop taking your medicine unless your doctor tells you to. Your doctor may order blood tests or other tests to see how you are doing. Women should inform their doctor if they wish to become pregnant or think they might be pregnant. There is a potential for serious side effects to an unborn child. Talk to your health care professional or pharmacist for more information. You should make sure that you get enough calcium and vitamin D while you are taking this medicine. Discuss the foods you eat and the vitamins you take with your health care professional. Some people who take this medicine have severe bone, joint, and/or muscle pain. This medicine may also increase your risk for jaw problems or a broken thigh bone. Tell your doctor right away if you have severe pain in your jaw, bones, joints, or muscles. Tell your doctor if you have any pain that does not go away or that gets worse. Tell your dentist and dental surgeon that you are taking this medicine. You should not have major dental surgery while on this medicine. See your dentist to have a dental exam and fix any dental problems before starting this medicine. Take good care of your teeth while on this medicine. Make sure you see your dentist for regular follow-up appointments. What  side effects may I notice from receiving this medicine? Side effects that you should report to your doctor or health care professional as soon as possible: -allergic reactions like skin rash, itching or hives, swelling of the face, lips, or tongue -anxiety, confusion, or depression -breathing  problems -changes in vision -eye pain -feeling faint or lightheaded, falls -jaw pain, especially after dental work -mouth sores -muscle cramps, stiffness, or weakness -redness, blistering, peeling or loosening of the skin, including inside the mouth -trouble passing urine or change in the amount of urine Side effects that usually do not require medical attention (report to your doctor or health care professional if they continue or are bothersome): -bone, joint, or muscle pain -constipation -diarrhea -fever -hair loss -irritation at site where injected -loss of appetite -nausea, vomiting -stomach upset -trouble sleeping -trouble swallowing -weak or tired This list may not describe all possible side effects. Call your doctor for medical advice about side effects. You may report side effects to FDA at 1-800-FDA-1088. Where should I keep my medicine? This drug is given in a hospital or clinic and will not be stored at home. NOTE: This sheet is a summary. It may not cover all possible information. If you have questions about this medicine, talk to your doctor, pharmacist, or health care provider.  2019 Elsevier/Gold Standard (2013-12-09 14:19:39)

## 2019-07-17 LAB — KAPPA/LAMBDA LIGHT CHAINS
Kappa free light chain: 77.3 mg/L — ABNORMAL HIGH (ref 3.3–19.4)
Kappa, lambda light chain ratio: 1.37 (ref 0.26–1.65)
Lambda free light chains: 56.6 mg/L — ABNORMAL HIGH (ref 5.7–26.3)

## 2019-07-19 ENCOUNTER — Telehealth: Payer: Self-pay | Admitting: Hematology and Oncology

## 2019-07-19 NOTE — Telephone Encounter (Signed)
Returned patient's phone call regarding scheduling an appointment, scheduled per 12/18 scheduled message. Patient's wife is notified.

## 2019-07-24 LAB — MULTIPLE MYELOMA PANEL, SERUM
Albumin SerPl Elph-Mcnc: 3.4 g/dL (ref 2.9–4.4)
Albumin/Glob SerPl: 1.2 (ref 0.7–1.7)
Alpha 1: 0.2 g/dL (ref 0.0–0.4)
Alpha2 Glob SerPl Elph-Mcnc: 0.6 g/dL (ref 0.4–1.0)
B-Globulin SerPl Elph-Mcnc: 0.8 g/dL (ref 0.7–1.3)
Gamma Glob SerPl Elph-Mcnc: 1.3 g/dL (ref 0.4–1.8)
Globulin, Total: 2.9 g/dL (ref 2.2–3.9)
IgA: 307 mg/dL (ref 61–437)
IgG (Immunoglobin G), Serum: 1346 mg/dL (ref 603–1613)
IgM (Immunoglobulin M), Srm: 23 mg/dL (ref 15–143)
Total Protein ELP: 6.3 g/dL (ref 6.0–8.5)

## 2019-07-25 ENCOUNTER — Telehealth: Payer: Self-pay

## 2019-07-25 NOTE — Telephone Encounter (Signed)
-----   Message from Heath Lark, MD sent at 07/25/2019  7:08 AM EST ----- Regarding: pls let him know recent myeloma panel showed he is still in remission

## 2019-07-25 NOTE — Telephone Encounter (Signed)
Called and given below message. She verbalized understanding. 

## 2019-08-11 ENCOUNTER — Other Ambulatory Visit: Payer: Self-pay | Admitting: *Deleted

## 2019-08-11 MED ORDER — LENALIDOMIDE 5 MG PO CAPS: ORAL_CAPSULE | ORAL | 0 refills | Status: DC

## 2019-08-25 ENCOUNTER — Inpatient Hospital Stay: Payer: Medicare Other | Attending: Hematology and Oncology

## 2019-08-25 ENCOUNTER — Other Ambulatory Visit: Payer: Self-pay

## 2019-08-25 DIAGNOSIS — Z452 Encounter for adjustment and management of vascular access device: Secondary | ICD-10-CM | POA: Diagnosis present

## 2019-08-25 DIAGNOSIS — C9000 Multiple myeloma not having achieved remission: Secondary | ICD-10-CM

## 2019-08-25 DIAGNOSIS — C9001 Multiple myeloma in remission: Secondary | ICD-10-CM | POA: Diagnosis present

## 2019-08-25 DIAGNOSIS — Z95828 Presence of other vascular implants and grafts: Secondary | ICD-10-CM

## 2019-08-25 MED ORDER — SODIUM CHLORIDE 0.9% FLUSH
10.0000 mL | INTRAVENOUS | Status: DC | PRN
  Administered 2019-08-25: 11:00:00 10 mL
  Filled 2019-08-25: qty 10

## 2019-08-25 MED ORDER — HEPARIN SOD (PORK) LOCK FLUSH 100 UNIT/ML IV SOLN
500.0000 [IU] | Freq: Once | INTRAVENOUS | Status: AC | PRN
  Administered 2019-08-25: 500 [IU] via INTRAVENOUS
  Filled 2019-08-25: qty 5

## 2019-08-25 MED ORDER — ALTEPLASE 2 MG IJ SOLR
2.0000 mg | Freq: Once | INTRAMUSCULAR | Status: DC | PRN
  Filled 2019-08-25: qty 2

## 2019-09-08 ENCOUNTER — Other Ambulatory Visit: Payer: Self-pay | Admitting: Hematology and Oncology

## 2019-10-06 ENCOUNTER — Other Ambulatory Visit: Payer: Self-pay

## 2019-10-06 MED ORDER — LENALIDOMIDE 5 MG PO CAPS: ORAL_CAPSULE | ORAL | 0 refills | Status: DC

## 2019-10-11 ENCOUNTER — Encounter: Payer: Self-pay | Admitting: Podiatry

## 2019-10-11 ENCOUNTER — Other Ambulatory Visit: Payer: Self-pay

## 2019-10-11 ENCOUNTER — Ambulatory Visit (INDEPENDENT_AMBULATORY_CARE_PROVIDER_SITE_OTHER): Payer: Medicare Other | Admitting: Podiatry

## 2019-10-11 VITALS — Temp 97.7°F

## 2019-10-11 DIAGNOSIS — M79676 Pain in unspecified toe(s): Secondary | ICD-10-CM

## 2019-10-11 DIAGNOSIS — B351 Tinea unguium: Secondary | ICD-10-CM | POA: Diagnosis not present

## 2019-10-11 NOTE — Progress Notes (Signed)
He presents today for chief complaint of painfully elongated toenails.  Objective: Toenails are long thick yellow dystrophic clinically mycotic 1 through 5 right 1 and 3 through 5 left.  No open lesions or wounds.  Assessment: Pain limb secondary onychomycosis.  Plan: Debridement of toenails 1 through 5 bilateral.  Follow-up with him in 3 months

## 2019-10-13 ENCOUNTER — Inpatient Hospital Stay: Payer: Medicare Other

## 2019-10-13 ENCOUNTER — Other Ambulatory Visit: Payer: Self-pay

## 2019-10-13 ENCOUNTER — Inpatient Hospital Stay: Payer: Medicare Other | Attending: Hematology and Oncology

## 2019-10-13 ENCOUNTER — Inpatient Hospital Stay (HOSPITAL_BASED_OUTPATIENT_CLINIC_OR_DEPARTMENT_OTHER): Payer: Medicare Other | Admitting: Hematology and Oncology

## 2019-10-13 ENCOUNTER — Encounter: Payer: Self-pay | Admitting: Hematology and Oncology

## 2019-10-13 DIAGNOSIS — C9001 Multiple myeloma in remission: Secondary | ICD-10-CM

## 2019-10-13 DIAGNOSIS — C9000 Multiple myeloma not having achieved remission: Secondary | ICD-10-CM

## 2019-10-13 DIAGNOSIS — C61 Malignant neoplasm of prostate: Secondary | ICD-10-CM

## 2019-10-13 DIAGNOSIS — N183 Chronic kidney disease, stage 3 unspecified: Secondary | ICD-10-CM | POA: Insufficient documentation

## 2019-10-13 DIAGNOSIS — Z95828 Presence of other vascular implants and grafts: Secondary | ICD-10-CM

## 2019-10-13 DIAGNOSIS — Z85828 Personal history of other malignant neoplasm of skin: Secondary | ICD-10-CM | POA: Diagnosis not present

## 2019-10-13 LAB — CBC WITH DIFFERENTIAL/PLATELET
Abs Immature Granulocytes: 0 10*3/uL (ref 0.00–0.07)
Basophils Absolute: 0.1 10*3/uL (ref 0.0–0.1)
Basophils Relative: 3 %
Eosinophils Absolute: 0.3 10*3/uL (ref 0.0–0.5)
Eosinophils Relative: 7 %
HCT: 35.1 % — ABNORMAL LOW (ref 39.0–52.0)
Hemoglobin: 12.2 g/dL — ABNORMAL LOW (ref 13.0–17.0)
Immature Granulocytes: 0 %
Lymphocytes Relative: 30 %
Lymphs Abs: 1.2 10*3/uL (ref 0.7–4.0)
MCH: 35.3 pg — ABNORMAL HIGH (ref 26.0–34.0)
MCHC: 34.8 g/dL (ref 30.0–36.0)
MCV: 101.4 fL — ABNORMAL HIGH (ref 80.0–100.0)
Monocytes Absolute: 0.7 10*3/uL (ref 0.1–1.0)
Monocytes Relative: 17 %
Neutro Abs: 1.8 10*3/uL (ref 1.7–7.7)
Neutrophils Relative %: 43 %
Platelets: 138 10*3/uL — ABNORMAL LOW (ref 150–400)
RBC: 3.46 MIL/uL — ABNORMAL LOW (ref 4.22–5.81)
RDW: 13.8 % (ref 11.5–15.5)
WBC: 4.1 10*3/uL (ref 4.0–10.5)
nRBC: 0 % (ref 0.0–0.2)

## 2019-10-13 LAB — COMPREHENSIVE METABOLIC PANEL
ALT: 14 U/L (ref 0–44)
AST: 21 U/L (ref 15–41)
Albumin: 3.1 g/dL — ABNORMAL LOW (ref 3.5–5.0)
Alkaline Phosphatase: 50 U/L (ref 38–126)
Anion gap: 5 (ref 5–15)
BUN: 20 mg/dL (ref 8–23)
CO2: 25 mmol/L (ref 22–32)
Calcium: 8.7 mg/dL — ABNORMAL LOW (ref 8.9–10.3)
Chloride: 109 mmol/L (ref 98–111)
Creatinine, Ser: 1.38 mg/dL — ABNORMAL HIGH (ref 0.61–1.24)
GFR calc Af Amer: 58 mL/min — ABNORMAL LOW (ref >60)
GFR calc non Af Amer: 50 mL/min — ABNORMAL LOW (ref >60)
Glucose, Bld: 116 mg/dL — ABNORMAL HIGH (ref 70–99)
Potassium: 3.7 mmol/L (ref 3.5–5.1)
Sodium: 139 mmol/L (ref 135–145)
Total Bilirubin: 0.6 mg/dL (ref 0.3–1.2)
Total Protein: 6.3 g/dL — ABNORMAL LOW (ref 6.5–8.1)

## 2019-10-13 MED ORDER — HEPARIN SOD (PORK) LOCK FLUSH 100 UNIT/ML IV SOLN
500.0000 [IU] | Freq: Once | INTRAVENOUS | Status: AC | PRN
  Administered 2019-10-13: 11:00:00 500 [IU] via INTRAVENOUS
  Filled 2019-10-13: qty 5

## 2019-10-13 MED ORDER — SODIUM CHLORIDE 0.9% FLUSH
10.0000 mL | INTRAVENOUS | Status: DC | PRN
  Administered 2019-10-13: 10 mL
  Filled 2019-10-13: qty 10

## 2019-10-13 MED ORDER — ZOLEDRONIC ACID 4 MG/5ML IV CONC
3.5000 mg | Freq: Once | INTRAVENOUS | Status: AC
  Administered 2019-10-13: 3.5 mg via INTRAVENOUS
  Filled 2019-10-13: qty 4.38

## 2019-10-13 MED ORDER — LIDOCAINE-PRILOCAINE 2.5-2.5 % EX CREA: TOPICAL_CREAM | CUTANEOUS | 2 refills | Status: DC | PRN

## 2019-10-13 MED ORDER — SODIUM CHLORIDE 0.9 % IV SOLN
Freq: Once | INTRAVENOUS | Status: AC
  Filled 2019-10-13: qty 250

## 2019-10-13 NOTE — Assessment & Plan Note (Signed)
He follows closely with urologist According to the patient, he is doing well He is currently asymptomatic

## 2019-10-13 NOTE — Assessment & Plan Note (Signed)
He has mild stable CKD I emphasized the importance of adequate fluid hydration

## 2019-10-13 NOTE — Progress Notes (Signed)
[place] OFFICE PROGRESS NOTE  Patient Care Team: Jinny Sanders, MD as PCP - General Jeanann Lewandowsky, MD as Consulting Physician (Hematology and Oncology) Cathlean Marseilles, NP as Nurse Practitioner (Hematology and Oncology) Raynelle Bring, MD as Consulting Physician (Urology) Heath Lark, MD as Consulting Physician (Hematology and Oncology) [place], Romilda Garret, [place] as Consulting Physician (Podiatry) Morton Stall, Howell Rucks, NP as Nurse Practitioner (Pain Medicine) Druscilla Brownie, MD as Consulting Physician (Dermatology) Bryson Ha, OD as Consulting Physician (Optometry)  ASSESSMENT & PLAN:  Multiple myeloma in remission [facility]) He is currently on low-dose Revlimid as a form of maintenance treatment In the meantime, he will continue aspirin, calcium with vitamin D He denies recent dental issues. He will continue Zometa every 3 months, reduced dose due to renal function  Malignant neoplasm of prostate [facility]) He follows closely with urologist According to the patient, he is doing well He is currently asymptomatic  Chronic kidney disease, stage III (moderate) He has mild stable CKD I emphasized the importance of adequate fluid hydration   History of skin cancer We discussed the importance of aggressive sun protection He will continue close follow-up with dermatologist   No orders of the defined types were placed in this encounter.   All questions were answered. The patient knows to call the clinic with any problems, questions or concerns. The total time spent in the appointment was 20 minutes encounter with patients including review of chart and various tests results, discussions about plan of care and coordination of care plan   Heath Lark, MD 10/13/2019 11:47 AM  INTERVAL HISTORY: Please see below for problem oriented charting. He returns for further follow-up He is doing well No urinary symptoms No signs or symptoms of osteonecrosis of the jaw His  chronic back pain is well controlled He has no recent infection, fever or chills  SUMMARY OF ONCOLOGIC HISTORY:  1. Velcade, Cytoxan, and Decadron on a weekly basis from 07/10/2011 through 10/23/2011; 2. High-dose chemotherapy on 11/23/2011, consisting of carmustine 600 mg IV. He received cytarabine 400 mg IV on 12/24/2011, 12/25/2011, 12/26/2011, and 12/27/2011. He received etoposide 300 mg IV daily from 12/24/2011 through 12/27/2011, four doses. He received melphalan 280 mg IV on 12/28/2011.  3. 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  Current therapy: -Revlimid 5 mg daily, 3 weeks on, 1 week off. Revlimid was started on 05/11/2012.  Zometa was started on 09/02/2012.  Dental clearance was obtained.  He was diagnosed with prostate cancer and received treatment through urology service  REVIEW OF SYSTEMS:   Constitutional: Denies fevers, chills or abnormal weight loss Eyes: Denies blurriness of vision Ears, nose, mouth, throat, and face: Denies mucositis or sore throat Respiratory: Denies cough, dyspnea or wheezes Cardiovascular: Denies palpitation, chest discomfort or lower extremity swelling Gastrointestinal:  Denies nausea, heartburn or change in bowel habits Skin: Denies abnormal skin rashes Lymphatics: Denies new lymphadenopathy or easy bruising Neurological:Denies numbness, tingling or new weaknesses Behavioral/Psych: Mood is stable, no new changes  All other systems were reviewed with the patient and are negative.  I have reviewed the past medical history, past surgical history, social history and family history with the patient and they are unchanged from previous note.  ALLERGIES:  is allergic to celebrex [celecoxib]; cymbalta [duloxetine hcl]; hydromorphone hcl; meloxicam; oxycontin [oxycodone]; percocet [oxycodone-acetaminophen]; vicodin [hydrocodone-acetaminophen]; and lyrica [pregabalin].  MEDICATIONS:  Current Outpatient Medications  Medication Sig  Dispense Refill  . aspirin 81 MG tablet Take 81 mg by mouth daily.    Marland Kitchen  B Complex Vitamins (VITAMIN-B COMPLEX) TABS Take by mouth.    . Calcium Carbonate-Vitamin D 600-400 MG-UNIT per tablet Take by mouth.    . cholecalciferol (VITAMIN D) 1000 UNITS tablet Take 1,000 Units by mouth daily.    . fentaNYL (DURAGESIC) 75 MCG/HR APP 1 PA EXT TO THE SKIN QOD    . hydrocortisone 2.5 % ointment APP AA BID    . lenalidomide (REVLIMID) 5 MG capsule Take 1 capsule by mouth every day for 21 days then 7 days off. 21 capsule 0  . lidocaine-prilocaine (EMLA) cream Apply topically as needed. Apply to port site one hour before treatment and cover with plastic wrap 30 g 2  . NONFORMULARY OR COMPOUNDED ITEM Baclofn/amitrp/ketamn cream topically to feet TID prn for neuropathy    . oxybutynin (DITROPAN) 5 MG tablet Take 5 mg by mouth 2 (two) times daily.    Marland Kitchen oxyCODONE (OXY IR/ROXICODONE) 5 MG immediate release tablet TK 1 T PO Q 4 H PRN P  0  . pantoprazole (PROTONIX) 40 MG tablet TAKE 1 TABLET BY MOUTH EVERY DAY 90 tablet 3   No current facility-administered medications for this visit.   Facility-Administered Medications Ordered in Other Visits  Medication Dose Route Frequency Provider Last Rate Last Admin  . sodium chloride flush (NS) 0.9 % injection 10 mL  10 mL Intracatheter PRN Alvy Bimler, Devine Klingel, MD   10 mL at 10/13/19 1100    PHYSICAL EXAMINATION: ECOG PERFORMANCE STATUS: 1 - Symptomatic but completely ambulatory  Vitals:   10/13/19 0951  BP: 133/68  Pulse: (!) 58  Resp: 18  Temp: 98 F (36.7 C)  SpO2: 100%   Filed Weights   10/13/19 0951  Weight: 189 lb 12.8 oz (86.1 kg)    GENERAL:alert, no distress and comfortable SKIN: skin color, texture, turgor are normal, no rashes or significant lesions EYES: normal, Conjunctiva are pink and non-injected, sclera clear OROPHARYNX:no exudate, no erythema and lips, buccal mucosa, and tongue normal  NECK: supple, thyroid normal size, non-tender, without  nodularity LYMPH:  no palpable lymphadenopathy in the cervical, axillary or inguinal LUNGS: clear to auscultation and percussion with normal breathing effort HEART: regular rate & rhythm and no murmurs and no lower extremity edema ABDOMEN:abdomen soft, non-tender and normal bowel sounds Musculoskeletal:no cyanosis of digits and no clubbing  NEURO: alert & oriented x 3 with fluent speech, no focal motor/sensory deficits  LABORATORY DATA:  I have reviewed the data as listed    Component Value Date/Time   NA 139 10/13/2019 0915   NA 139 05/14/2017 1145   K 3.7 10/13/2019 0915   K 3.8 05/14/2017 1145   CL 109 10/13/2019 0915   CL 108 (H) 09/30/2013 0945   CL 110 (H) 12/29/2012 1151   CO2 25 10/13/2019 0915   CO2 27 05/14/2017 1145   GLUCOSE 116 (H) 10/13/2019 0915   GLUCOSE 109 05/14/2017 1145   GLUCOSE 127 (H) 12/29/2012 1151   BUN 20 10/13/2019 0915   BUN 17.9 05/14/2017 1145   CREATININE 1.38 (H) 10/13/2019 0915   CREATININE 1.3 05/14/2017 1145   CALCIUM 8.7 (L) 10/13/2019 0915   CALCIUM 9.3 05/14/2017 1145   PROT 6.3 (L) 10/13/2019 0915   PROT 6.2 05/14/2017 1145   PROT 6.6 05/14/2017 1145   ALBUMIN 3.1 (L) 10/13/2019 0915   ALBUMIN 3.3 (L) 05/14/2017 1145   AST 21 10/13/2019 0915   AST 19 05/14/2017 1145   ALT 14 10/13/2019 0915   ALT 18 05/14/2017 1145  ALKPHOS 50 10/13/2019 0915   ALKPHOS 45 05/14/2017 1145   BILITOT 0.6 10/13/2019 0915   BILITOT 0.67 05/14/2017 1145   GFRNONAA 50 (L) 10/13/2019 0915   GFRNONAA >60 09/30/2013 0945   GFRAA 58 (L) 10/13/2019 0915   GFRAA >60 09/30/2013 0945    No results found for: SPEP, UPEP  Lab Results  Component Value Date   WBC 4.1 10/13/2019   NEUTROABS 1.8 10/13/2019   HGB 12.2 (L) 10/13/2019   HCT 35.1 (L) 10/13/2019   MCV 101.4 (H) 10/13/2019   PLT 138 (L) 10/13/2019      Chemistry      Component Value Date/Time   NA 139 10/13/2019 0915   NA 139 05/14/2017 1145   K 3.7 10/13/2019 0915   K 3.8 05/14/2017  1145   CL 109 10/13/2019 0915   CL 108 (H) 09/30/2013 0945   CL 110 (H) 12/29/2012 1151   CO2 25 10/13/2019 0915   CO2 27 05/14/2017 1145   BUN 20 10/13/2019 0915   BUN 17.9 05/14/2017 1145   CREATININE 1.38 (H) 10/13/2019 0915   CREATININE 1.3 05/14/2017 1145      Component Value Date/Time   CALCIUM 8.7 (L) 10/13/2019 0915   CALCIUM 9.3 05/14/2017 1145   ALKPHOS 50 10/13/2019 0915   ALKPHOS 45 05/14/2017 1145   AST 21 10/13/2019 0915   AST 19 05/14/2017 1145   ALT 14 10/13/2019 0915   ALT 18 05/14/2017 1145   BILITOT 0.6 10/13/2019 0915   BILITOT 0.67 05/14/2017 1145

## 2019-10-13 NOTE — Assessment & Plan Note (Signed)
We discussed the importance of aggressive sun protection He will continue close follow-up with dermatologist

## 2019-10-13 NOTE — Assessment & Plan Note (Signed)
He is currently on low-dose Revlimid as a form of maintenance treatment In the meantime, he will continue aspirin, calcium with vitamin D He denies recent dental issues. He will continue Zometa every 3 months, reduced dose due to renal function

## 2019-10-13 NOTE — Patient Instructions (Signed)
Zoledronic Acid injection (Hypercalcemia, Oncology) What is this medicine? ZOLEDRONIC ACID (ZOE le dron ik AS id) lowers the amount of calcium loss from bone. It is used to treat too much calcium in your blood from cancer. It is also used to prevent complications of cancer that has spread to the bone. This medicine may be used for other purposes; ask your health care provider or pharmacist if you have questions. COMMON BRAND NAME(S): Zometa What should I tell my health care provider before I take this medicine? They need to know if you have any of these conditions:  aspirin-sensitive asthma  cancer, especially if you are receiving medicines used to treat cancer  dental disease or wear dentures  infection  kidney disease  receiving corticosteroids like dexamethasone or prednisone  an unusual or allergic reaction to zoledronic acid, other medicines, foods, dyes, or preservatives  pregnant or trying to get pregnant  breast-feeding How should I use this medicine? This medicine is for infusion into a vein. It is given by a health care professional in a hospital or clinic setting. Talk to your pediatrician regarding the use of this medicine in children. Special care may be needed. Overdosage: If you think you have taken too much of this medicine contact a poison control center or emergency room at once. NOTE: This medicine is only for you. Do not share this medicine with others. What if I miss a dose? It is important not to miss your dose. Call your doctor or health care professional if you are unable to keep an appointment. What may interact with this medicine?  certain antibiotics given by injection  NSAIDs, medicines for pain and inflammation, like ibuprofen or naproxen  some diuretics like bumetanide, furosemide  teriparatide  thalidomide This list may not describe all possible interactions. Give your health care provider a list of all the medicines, herbs, non-prescription  drugs, or dietary supplements you use. Also tell them if you smoke, drink alcohol, or use illegal drugs. Some items may interact with your medicine. What should I watch for while using this medicine? Visit your doctor or health care professional for regular checkups. It may be some time before you see the benefit from this medicine. Do not stop taking your medicine unless your doctor tells you to. Your doctor may order blood tests or other tests to see how you are doing. Women should inform their doctor if they wish to become pregnant or think they might be pregnant. There is a potential for serious side effects to an unborn child. Talk to your health care professional or pharmacist for more information. You should make sure that you get enough calcium and vitamin D while you are taking this medicine. Discuss the foods you eat and the vitamins you take with your health care professional. Some people who take this medicine have severe bone, joint, and/or muscle pain. This medicine may also increase your risk for jaw problems or a broken thigh bone. Tell your doctor right away if you have severe pain in your jaw, bones, joints, or muscles. Tell your doctor if you have any pain that does not go away or that gets worse. Tell your dentist and dental surgeon that you are taking this medicine. You should not have major dental surgery while on this medicine. See your dentist to have a dental exam and fix any dental problems before starting this medicine. Take good care of your teeth while on this medicine. Make sure you see your dentist for regular follow-up   appointments. What side effects may I notice from receiving this medicine? Side effects that you should report to your doctor or health care professional as soon as possible:  allergic reactions like skin rash, itching or hives, swelling of the face, lips, or tongue  anxiety, confusion, or depression  breathing problems  changes in vision  eye  pain  feeling faint or lightheaded, falls  jaw pain, especially after dental work  mouth sores  muscle cramps, stiffness, or weakness  redness, blistering, peeling or loosening of the skin, including inside the mouth  trouble passing urine or change in the amount of urine Side effects that usually do not require medical attention (report to your doctor or health care professional if they continue or are bothersome):  bone, joint, or muscle pain  constipation  diarrhea  fever  hair loss  irritation at site where injected  loss of appetite  nausea, vomiting  stomach upset  trouble sleeping  trouble swallowing  weak or tired This list may not describe all possible side effects. Call your doctor for medical advice about side effects. You may report side effects to FDA at 1-800-FDA-1088. Where should I keep my medicine? This drug is given in a hospital or clinic and will not be stored at home. NOTE: This sheet is a summary. It may not cover all possible information. If you have questions about this medicine, talk to your doctor, pharmacist, or health care provider.  2020 Elsevier/Gold Standard (2013-12-09 14:19:39)  

## 2019-10-16 ENCOUNTER — Telehealth: Payer: Self-pay | Admitting: Hematology and Oncology

## 2019-10-16 LAB — KAPPA/LAMBDA LIGHT CHAINS
Kappa free light chain: 68.7 mg/L — ABNORMAL HIGH (ref 3.3–19.4)
Kappa, lambda light chain ratio: 1.46 (ref 0.26–1.65)
Lambda free light chains: 47.1 mg/L — ABNORMAL HIGH (ref 5.7–26.3)

## 2019-10-16 NOTE — Telephone Encounter (Signed)
Scheduled per 3/19 sch msg. Called and spoke with pt, confirmed 4/30 and 6/11 appts

## 2019-10-17 LAB — MULTIPLE MYELOMA PANEL, SERUM
Albumin SerPl Elph-Mcnc: 3.3 g/dL (ref 2.9–4.4)
Albumin/Glob SerPl: 1.2 (ref 0.7–1.7)
Alpha 1: 0.2 g/dL (ref 0.0–0.4)
Alpha2 Glob SerPl Elph-Mcnc: 0.6 g/dL (ref 0.4–1.0)
B-Globulin SerPl Elph-Mcnc: 0.8 g/dL (ref 0.7–1.3)
Gamma Glob SerPl Elph-Mcnc: 1.3 g/dL (ref 0.4–1.8)
Globulin, Total: 2.8 g/dL (ref 2.2–3.9)
IgA: 307 mg/dL (ref 61–437)
IgG (Immunoglobin G), Serum: 1293 mg/dL (ref 603–1613)
IgM (Immunoglobulin M), Srm: 19 mg/dL (ref 15–143)
Total Protein ELP: 6.1 g/dL (ref 6.0–8.5)

## 2019-10-18 ENCOUNTER — Telehealth: Payer: Self-pay

## 2019-10-18 NOTE — Telephone Encounter (Signed)
-----   Message from Heath Lark, MD sent at 10/18/2019  7:36 AM EDT ----- Regarding: pls call him and let him know myeloma panel is good and he is still in remission

## 2019-10-18 NOTE — Telephone Encounter (Signed)
TC to Pt. Per Dr.Gorsuch message relayed. Pt very happy, verbalized understanding.

## 2019-10-26 ENCOUNTER — Other Ambulatory Visit: Payer: Self-pay | Admitting: Urology

## 2019-10-26 DIAGNOSIS — C61 Malignant neoplasm of prostate: Secondary | ICD-10-CM

## 2019-11-06 ENCOUNTER — Other Ambulatory Visit: Payer: Self-pay

## 2019-11-06 MED ORDER — LENALIDOMIDE 5 MG PO CAPS: ORAL_CAPSULE | ORAL | 0 refills | Status: DC

## 2019-11-24 ENCOUNTER — Other Ambulatory Visit: Payer: Self-pay

## 2019-11-24 ENCOUNTER — Inpatient Hospital Stay: Payer: Medicare Other | Attending: Hematology and Oncology

## 2019-12-01 ENCOUNTER — Other Ambulatory Visit: Payer: Self-pay

## 2019-12-01 MED ORDER — LENALIDOMIDE 5 MG PO CAPS: ORAL_CAPSULE | ORAL | 0 refills | Status: DC

## 2019-12-29 ENCOUNTER — Other Ambulatory Visit: Payer: Self-pay

## 2019-12-29 MED ORDER — LENALIDOMIDE 5 MG PO CAPS: ORAL_CAPSULE | ORAL | 0 refills | Status: DC

## 2020-01-05 ENCOUNTER — Inpatient Hospital Stay (HOSPITAL_BASED_OUTPATIENT_CLINIC_OR_DEPARTMENT_OTHER): Payer: Medicare Other | Admitting: Hematology and Oncology

## 2020-01-05 ENCOUNTER — Inpatient Hospital Stay: Payer: Medicare Other | Attending: Hematology and Oncology

## 2020-01-05 ENCOUNTER — Encounter: Payer: Self-pay | Admitting: Hematology and Oncology

## 2020-01-05 ENCOUNTER — Inpatient Hospital Stay: Payer: Medicare Other

## 2020-01-05 ENCOUNTER — Other Ambulatory Visit: Payer: Self-pay

## 2020-01-05 DIAGNOSIS — N183 Chronic kidney disease, stage 3 unspecified: Secondary | ICD-10-CM | POA: Insufficient documentation

## 2020-01-05 DIAGNOSIS — Z95828 Presence of other vascular implants and grafts: Secondary | ICD-10-CM

## 2020-01-05 DIAGNOSIS — C61 Malignant neoplasm of prostate: Secondary | ICD-10-CM | POA: Insufficient documentation

## 2020-01-05 DIAGNOSIS — G8929 Other chronic pain: Secondary | ICD-10-CM

## 2020-01-05 DIAGNOSIS — C9001 Multiple myeloma in remission: Secondary | ICD-10-CM

## 2020-01-05 DIAGNOSIS — C9000 Multiple myeloma not having achieved remission: Secondary | ICD-10-CM

## 2020-01-05 DIAGNOSIS — Z5189 Encounter for other specified aftercare: Secondary | ICD-10-CM | POA: Insufficient documentation

## 2020-01-05 DIAGNOSIS — M545 Low back pain, unspecified: Secondary | ICD-10-CM

## 2020-01-05 LAB — CBC WITH DIFFERENTIAL/PLATELET
Abs Immature Granulocytes: 0 10*3/uL (ref 0.00–0.07)
Basophils Absolute: 0.1 10*3/uL (ref 0.0–0.1)
Basophils Relative: 2 %
Eosinophils Absolute: 0.2 10*3/uL (ref 0.0–0.5)
Eosinophils Relative: 5 %
HCT: 34.1 % — ABNORMAL LOW (ref 39.0–52.0)
Hemoglobin: 11.7 g/dL — ABNORMAL LOW (ref 13.0–17.0)
Immature Granulocytes: 0 %
Lymphocytes Relative: 32 %
Lymphs Abs: 1.1 10*3/uL (ref 0.7–4.0)
MCH: 34.5 pg — ABNORMAL HIGH (ref 26.0–34.0)
MCHC: 34.3 g/dL (ref 30.0–36.0)
MCV: 100.6 fL — ABNORMAL HIGH (ref 80.0–100.0)
Monocytes Absolute: 0.6 10*3/uL (ref 0.1–1.0)
Monocytes Relative: 18 %
Neutro Abs: 1.6 10*3/uL — ABNORMAL LOW (ref 1.7–7.7)
Neutrophils Relative %: 43 %
Platelets: 129 10*3/uL — ABNORMAL LOW (ref 150–400)
RBC: 3.39 MIL/uL — ABNORMAL LOW (ref 4.22–5.81)
RDW: 14 % (ref 11.5–15.5)
WBC: 3.6 10*3/uL — ABNORMAL LOW (ref 4.0–10.5)
nRBC: 0 % (ref 0.0–0.2)

## 2020-01-05 LAB — COMPREHENSIVE METABOLIC PANEL
ALT: 13 U/L (ref 0–44)
AST: 19 U/L (ref 15–41)
Albumin: 3.2 g/dL — ABNORMAL LOW (ref 3.5–5.0)
Alkaline Phosphatase: 49 U/L (ref 38–126)
Anion gap: 7 (ref 5–15)
BUN: 20 mg/dL (ref 8–23)
CO2: 25 mmol/L (ref 22–32)
Calcium: 9 mg/dL (ref 8.9–10.3)
Chloride: 109 mmol/L (ref 98–111)
Creatinine, Ser: 1.53 mg/dL — ABNORMAL HIGH (ref 0.61–1.24)
GFR calc Af Amer: 51 mL/min — ABNORMAL LOW (ref >60)
GFR calc non Af Amer: 44 mL/min — ABNORMAL LOW (ref >60)
Glucose, Bld: 107 mg/dL — ABNORMAL HIGH (ref 70–99)
Potassium: 3.7 mmol/L (ref 3.5–5.1)
Sodium: 141 mmol/L (ref 135–145)
Total Bilirubin: 0.7 mg/dL (ref 0.3–1.2)
Total Protein: 6.5 g/dL (ref 6.5–8.1)

## 2020-01-05 MED ORDER — SODIUM CHLORIDE 0.9% FLUSH
10.0000 mL | INTRAVENOUS | Status: DC | PRN
  Administered 2020-01-05: 10 mL
  Filled 2020-01-05: qty 10

## 2020-01-05 MED ORDER — ALTEPLASE 2 MG IJ SOLR
2.0000 mg | Freq: Once | INTRAMUSCULAR | Status: DC | PRN
  Filled 2020-01-05: qty 2

## 2020-01-05 MED ORDER — HEPARIN SOD (PORK) LOCK FLUSH 100 UNIT/ML IV SOLN
500.0000 [IU] | Freq: Once | INTRAVENOUS | Status: DC | PRN
  Filled 2020-01-05: qty 5

## 2020-01-05 MED ORDER — ZOLEDRONIC ACID 4 MG/5ML IV CONC
3.5000 mg | Freq: Once | INTRAVENOUS | Status: AC
  Administered 2020-01-05: 3.5 mg via INTRAVENOUS
  Filled 2020-01-05: qty 4.38

## 2020-01-05 MED ORDER — HEPARIN SOD (PORK) LOCK FLUSH 10 UNIT/ML IV SOLN: 10.0000 [IU] | Freq: Once | INTRAVENOUS | Status: DC

## 2020-01-05 MED ORDER — SODIUM CHLORIDE 0.9% FLUSH
10.0000 mL | INTRAVENOUS | Status: DC | PRN
  Administered 2020-01-05: 10 mL via INTRAVENOUS
  Filled 2020-01-05: qty 10

## 2020-01-05 MED ORDER — SODIUM CHLORIDE 0.9 % IV SOLN
INTRAVENOUS | Status: DC
  Filled 2020-01-05: qty 250

## 2020-01-05 NOTE — Assessment & Plan Note (Signed)
He has chronic back pain and recently have exacerbation of right groin pain I recommend he contact his pain management specialist from [place] for treatment recommendation

## 2020-01-05 NOTE — Assessment & Plan Note (Addendum)
He is undergoing extensive evaluation with urologist regarding prostate cancer follow-up I will follow-up on test results on June 23

## 2020-01-05 NOTE — Patient Instructions (Signed)
Zoledronic Acid injection (Hypercalcemia, Oncology) What is this medicine? ZOLEDRONIC ACID (ZOE le dron ik AS id) lowers the amount of calcium loss from bone. It is used to treat too much calcium in your blood from cancer. It is also used to prevent complications of cancer that has spread to the bone. This medicine may be used for other purposes; ask your health care provider or pharmacist if you have questions. COMMON BRAND NAME(S): Zometa What should I tell my health care provider before I take this medicine? They need to know if you have any of these conditions:  aspirin-sensitive asthma  cancer, especially if you are receiving medicines used to treat cancer  dental disease or wear dentures  infection  kidney disease  receiving corticosteroids like dexamethasone or prednisone  an unusual or allergic reaction to zoledronic acid, other medicines, foods, dyes, or preservatives  pregnant or trying to get pregnant  breast-feeding How should I use this medicine? This medicine is for infusion into a vein. It is given by a health care professional in a hospital or clinic setting. Talk to your pediatrician regarding the use of this medicine in children. Special care may be needed. Overdosage: If you think you have taken too much of this medicine contact a poison control center or emergency room at once. NOTE: This medicine is only for you. Do not share this medicine with others. What if I miss a dose? It is important not to miss your dose. Call your doctor or health care professional if you are unable to keep an appointment. What may interact with this medicine?  certain antibiotics given by injection  NSAIDs, medicines for pain and inflammation, like ibuprofen or naproxen  some diuretics like bumetanide, furosemide  teriparatide  thalidomide This list may not describe all possible interactions. Give your health care provider a list of all the medicines, herbs, non-prescription  drugs, or dietary supplements you use. Also tell them if you smoke, drink alcohol, or use illegal drugs. Some items may interact with your medicine. What should I watch for while using this medicine? Visit your doctor or health care professional for regular checkups. It may be some time before you see the benefit from this medicine. Do not stop taking your medicine unless your doctor tells you to. Your doctor may order blood tests or other tests to see how you are doing. Women should inform their doctor if they wish to become pregnant or think they might be pregnant. There is a potential for serious side effects to an unborn child. Talk to your health care professional or pharmacist for more information. You should make sure that you get enough calcium and vitamin D while you are taking this medicine. Discuss the foods you eat and the vitamins you take with your health care professional. Some people who take this medicine have severe bone, joint, and/or muscle pain. This medicine may also increase your risk for jaw problems or a broken thigh bone. Tell your doctor right away if you have severe pain in your jaw, bones, joints, or muscles. Tell your doctor if you have any pain that does not go away or that gets worse. Tell your dentist and dental surgeon that you are taking this medicine. You should not have major dental surgery while on this medicine. See your dentist to have a dental exam and fix any dental problems before starting this medicine. Take good care of your teeth while on this medicine. Make sure you see your dentist for regular follow-up   appointments. What side effects may I notice from receiving this medicine? Side effects that you should report to your doctor or health care professional as soon as possible:  allergic reactions like skin rash, itching or hives, swelling of the face, lips, or tongue  anxiety, confusion, or depression  breathing problems  changes in vision  eye  pain  feeling faint or lightheaded, falls  jaw pain, especially after dental work  mouth sores  muscle cramps, stiffness, or weakness  redness, blistering, peeling or loosening of the skin, including inside the mouth  trouble passing urine or change in the amount of urine Side effects that usually do not require medical attention (report to your doctor or health care professional if they continue or are bothersome):  bone, joint, or muscle pain  constipation  diarrhea  fever  hair loss  irritation at site where injected  loss of appetite  nausea, vomiting  stomach upset  trouble sleeping  trouble swallowing  weak or tired This list may not describe all possible side effects. Call your doctor for medical advice about side effects. You may report side effects to FDA at 1-800-FDA-1088. Where should I keep my medicine? This drug is given in a hospital or clinic and will not be stored at home. NOTE: This sheet is a summary. It may not cover all possible information. If you have questions about this medicine, talk to your doctor, pharmacist, or health care provider.  2020 Elsevier/Gold Standard (2013-12-09 14:19:39)  

## 2020-01-05 NOTE — Assessment & Plan Note (Signed)
He is currently on low-dose Revlimid as a form of maintenance treatment In the meantime, he will continue aspirin, calcium with vitamin D He denies recent dental issues. He will continue Zometa every 3 months, reduced dose due to renal function

## 2020-01-05 NOTE — Assessment & Plan Note (Signed)
He has mild stable CKD I emphasized the importance of adequate fluid hydration

## 2020-01-05 NOTE — Progress Notes (Signed)
OK for Zometa (dose-reduced) per Dr. Alvy Bimler SCr = 1.53  Kennith Center, Pharm.D., CPP 01/05/2020@11 :05 AM

## 2020-01-05 NOTE — Progress Notes (Signed)
[place] OFFICE PROGRESS NOTE  Patient Care Team: Jinny Sanders, MD as PCP - General Jeanann Lewandowsky, MD as Consulting Physician (Hematology and Oncology) Cathlean Marseilles, NP as Nurse Practitioner (Hematology and Oncology) Raynelle Bring, MD as Consulting Physician (Urology) Heath Lark, MD as Consulting Physician (Hematology and Oncology) [place], Romilda Garret, [place] as Consulting Physician (Podiatry) Morton Stall, Howell Rucks, NP as Nurse Practitioner (Pain Medicine) Druscilla Brownie, MD as Consulting Physician (Dermatology) Bryson Ha, OD as Consulting Physician (Optometry)  ASSESSMENT & PLAN:  Multiple myeloma in remission [facility]) He is currently on low-dose Revlimid as a form of maintenance treatment In the meantime, he will continue aspirin, calcium with vitamin D He denies recent dental issues. He will continue Zometa every 3 months, reduced dose due to renal function  Chronic kidney disease, stage III (moderate) He has mild stable CKD I emphasized the importance of adequate fluid hydration   Malignant neoplasm of prostate [facility]) He is undergoing extensive evaluation with urologist regarding prostate cancer follow-up I will follow-up on test results on June 23  Chronic back pain He has chronic back pain and recently have exacerbation of right groin pain I recommend he contact his pain management specialist from [place] for treatment recommendation   No orders of the defined types were placed in this encounter.   All questions were answered. The patient knows to call the clinic with any problems, questions or concerns. The total time spent in the appointment was 25 minutes encounter with patients including review of chart and various tests results, discussions about plan of care and coordination of care plan   Heath Lark, MD 01/05/2020 3:51 PM  INTERVAL HISTORY: Please see below for problem oriented charting. He returns for further follow-up and Zometa  treatment His myeloma is stable He complained of recent intermittent right groin pain.  He felt that this pain in the right groin is a little worse over the past month He felt that his current prescribed pain medicine is not adequate to control his pain but he has not contacted his pain management specialist He is undergoing further testing due to elevated PSA with urology service He is compliant taking Revlimid as prescribed No recent infection, fever or chills No recent dental issues  SUMMARY OF ONCOLOGIC HISTORY:  1. Velcade, Cytoxan, and Decadron on a weekly basis from 07/10/2011 through 10/23/2011; 2. High-dose chemotherapy on 11/23/2011, consisting of carmustine 600 mg IV. He received cytarabine 400 mg IV on 12/24/2011, 12/25/2011, 12/26/2011, and 12/27/2011. He received etoposide 300 mg IV daily from 12/24/2011 through 12/27/2011, four doses. He received melphalan 280 mg IV on 12/28/2011.  3. 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  Current therapy: -Revlimid 5 mg daily, 3 weeks on, 1 week off. Revlimid was started on 05/11/2012.  Zometa was started on 09/02/2012.  Dental clearance was obtained.  He was diagnosed with prostate cancer and received treatment through urology service   REVIEW OF SYSTEMS:   Constitutional: Denies fevers, chills or abnormal weight loss Eyes: Denies blurriness of vision Ears, nose, mouth, throat, and face: Denies mucositis or sore throat Respiratory: Denies cough, dyspnea or wheezes Cardiovascular: Denies palpitation, chest discomfort or lower extremity swelling Gastrointestinal:  Denies nausea, heartburn or change in bowel habits Skin: Denies abnormal skin rashes Lymphatics: Denies new lymphadenopathy or easy bruising Neurological:Denies numbness, tingling or new weaknesses Behavioral/Psych: Mood is stable, no new changes  All other systems were reviewed with the patient and are negative.  I have reviewed the  past medical history,  past surgical history, social history and family history with the patient and they are unchanged from previous note.  ALLERGIES:  is allergic to celebrex [celecoxib], cymbalta [duloxetine hcl], hydromorphone hcl, meloxicam, oxycontin [oxycodone], percocet [oxycodone-acetaminophen], vicodin [hydrocodone-acetaminophen], and lyrica [pregabalin].  MEDICATIONS:  Current Outpatient Medications  Medication Sig Dispense Refill  . aspirin 81 MG tablet Take 81 mg by mouth daily.    . B Complex Vitamins (VITAMIN-B COMPLEX) TABS Take by mouth.    . Calcium Carbonate-Vitamin D 600-400 MG-UNIT per tablet Take by mouth.    . cholecalciferol (VITAMIN D) 1000 UNITS tablet Take 1,000 Units by mouth daily.    . fentaNYL (DURAGESIC) 75 MCG/HR APP 1 PA EXT TO THE SKIN QOD    . hydrocortisone 2.5 % ointment APP AA BID    . lenalidomide (REVLIMID) 5 MG capsule Take 1 capsule by mouth every day for 21 days then 7 days off. 21 capsule 0  . lidocaine-prilocaine (EMLA) cream Apply topically as needed. Apply to port site one hour before treatment and cover with plastic wrap 30 g 2  . NONFORMULARY OR COMPOUNDED ITEM Baclofn/amitrp/ketamn cream topically to feet TID prn for neuropathy    . oxybutynin (DITROPAN) 5 MG tablet Take 5 mg by mouth 2 (two) times daily.    Marland Kitchen oxyCODONE (OXY IR/ROXICODONE) 5 MG immediate release tablet TK 1 T PO Q 4 H PRN P  0  . pantoprazole (PROTONIX) 40 MG tablet TAKE 1 TABLET BY MOUTH EVERY DAY 90 tablet 3   No current facility-administered medications for this visit.   Facility-Administered Medications Ordered in Other Visits  Medication Dose Route Frequency Provider Last Rate Last Admin  . 0.9 %  sodium chloride infusion   Intravenous Continuous Heath Lark, MD   Stopped at 01/05/20 1159  . alteplase (CATHFLO ACTIVASE) injection 2 mg  2 mg Intracatheter Once PRN Alvy Bimler, Davin Muramoto, MD      . heparin flush 10 UNIT/ML injection 10 Units  10 Units Intracatheter Once Alvy Bimler, Madalin Hughart, MD      . heparin  lock flush 100 unit/mL  500 Units Intravenous Once PRN Alvy Bimler, Kanyah Matsushima, MD      . sodium chloride flush (NS) 0.9 % injection 10 mL  10 mL Intravenous PRN Alvy Bimler, Petra Sargeant, MD   10 mL at 01/05/20 1202    PHYSICAL EXAMINATION: ECOG PERFORMANCE STATUS: 0 - Asymptomatic  Vitals:   01/05/20 1020  BP: 112/81  Pulse: 67  Resp: 18  Temp: 98.2 F (36.8 C)  SpO2: 100%   Filed Weights   01/05/20 1020  Weight: 188 lb 12.8 oz (85.6 kg)    GENERAL:alert, no distress and comfortable SKIN: skin color, texture, turgor are normal, no rashes or significant lesions EYES: normal, Conjunctiva are pink and non-injected, sclera clear OROPHARYNX:no exudate, no erythema and lips, buccal mucosa, and tongue normal  NECK: supple, thyroid normal size, non-tender, without nodularity LYMPH:  no palpable lymphadenopathy in the cervical, axillary or inguinal LUNGS: clear to auscultation and percussion with normal breathing effort HEART: regular rate & rhythm and no murmurs and no lower extremity edema ABDOMEN:abdomen soft, non-tender and normal bowel sounds Musculoskeletal:no cyanosis of digits and no clubbing  NEURO: alert & oriented x 3 with fluent speech, no focal motor/sensory deficits  LABORATORY DATA:  I have reviewed the data as listed    Component Value Date/Time   NA 141 01/05/2020 0959   NA 139 05/14/2017 1145   K 3.7 01/05/2020 0959   K 3.8  05/14/2017 1145   CL 109 01/05/2020 0959   CL 108 (H) 09/30/2013 0945   CL 110 (H) 12/29/2012 1151   CO2 25 01/05/2020 0959   CO2 27 05/14/2017 1145   GLUCOSE 107 (H) 01/05/2020 0959   GLUCOSE 109 05/14/2017 1145   GLUCOSE 127 (H) 12/29/2012 1151   BUN 20 01/05/2020 0959   BUN 17.9 05/14/2017 1145   CREATININE 1.53 (H) 01/05/2020 0959   CREATININE 1.3 05/14/2017 1145   CALCIUM 9.0 01/05/2020 0959   CALCIUM 9.3 05/14/2017 1145   PROT 6.5 01/05/2020 0959   PROT 6.2 05/14/2017 1145   PROT 6.6 05/14/2017 1145   ALBUMIN 3.2 (L) 01/05/2020 0959   ALBUMIN 3.3  (L) 05/14/2017 1145   AST 19 01/05/2020 0959   AST 19 05/14/2017 1145   ALT 13 01/05/2020 0959   ALT 18 05/14/2017 1145   ALKPHOS 49 01/05/2020 0959   ALKPHOS 45 05/14/2017 1145   BILITOT 0.7 01/05/2020 0959   BILITOT 0.67 05/14/2017 1145   GFRNONAA 44 (L) 01/05/2020 0959   GFRNONAA >60 09/30/2013 0945   GFRAA 51 (L) 01/05/2020 0959   GFRAA >60 09/30/2013 0945    No results found for: SPEP, UPEP  Lab Results  Component Value Date   WBC 3.6 (L) 01/05/2020   NEUTROABS 1.6 (L) 01/05/2020   HGB 11.7 (L) 01/05/2020   HCT 34.1 (L) 01/05/2020   MCV 100.6 (H) 01/05/2020   PLT 129 (L) 01/05/2020      Chemistry      Component Value Date/Time   NA 141 01/05/2020 0959   NA 139 05/14/2017 1145   K 3.7 01/05/2020 0959   K 3.8 05/14/2017 1145   CL 109 01/05/2020 0959   CL 108 (H) 09/30/2013 0945   CL 110 (H) 12/29/2012 1151   CO2 25 01/05/2020 0959   CO2 27 05/14/2017 1145   BUN 20 01/05/2020 0959   BUN 17.9 05/14/2017 1145   CREATININE 1.53 (H) 01/05/2020 0959   CREATININE 1.3 05/14/2017 1145      Component Value Date/Time   CALCIUM 9.0 01/05/2020 0959   CALCIUM 9.3 05/14/2017 1145   ALKPHOS 49 01/05/2020 0959   ALKPHOS 45 05/14/2017 1145   AST 19 01/05/2020 0959   AST 19 05/14/2017 1145   ALT 13 01/05/2020 0959   ALT 18 05/14/2017 1145   BILITOT 0.7 01/05/2020 0959   BILITOT 0.67 05/14/2017 1145

## 2020-01-08 ENCOUNTER — Telehealth: Payer: Self-pay | Admitting: Hematology and Oncology

## 2020-01-08 LAB — KAPPA/LAMBDA LIGHT CHAINS
Kappa free light chain: 71.3 mg/L — ABNORMAL HIGH (ref 3.3–19.4)
Kappa, lambda light chain ratio: 1.55 (ref 0.26–1.65)
Lambda free light chains: 46.1 mg/L — ABNORMAL HIGH (ref 5.7–26.3)

## 2020-01-08 NOTE — Telephone Encounter (Signed)
Scheduled per 6/11 sch message. Pt is mychart active and is aware of appts. Mailing appt calendar per pt request.

## 2020-01-09 LAB — MULTIPLE MYELOMA PANEL, SERUM
Albumin SerPl Elph-Mcnc: 3.3 g/dL (ref 2.9–4.4)
Albumin/Glob SerPl: 1.2 (ref 0.7–1.7)
Alpha 1: 0.2 g/dL (ref 0.0–0.4)
Alpha2 Glob SerPl Elph-Mcnc: 0.6 g/dL (ref 0.4–1.0)
B-Globulin SerPl Elph-Mcnc: 0.8 g/dL (ref 0.7–1.3)
Gamma Glob SerPl Elph-Mcnc: 1.4 g/dL (ref 0.4–1.8)
Globulin, Total: 3 g/dL (ref 2.2–3.9)
IgA: 306 mg/dL (ref 61–437)
IgG (Immunoglobin G), Serum: 1367 mg/dL (ref 603–1613)
IgM (Immunoglobulin M), Srm: 17 mg/dL (ref 15–143)
Total Protein ELP: 6.3 g/dL (ref 6.0–8.5)

## 2020-01-10 ENCOUNTER — Telehealth: Payer: Self-pay | Admitting: *Deleted

## 2020-01-10 NOTE — Telephone Encounter (Signed)
Per Dr.Gorsuch, called pt to make aware that myeloma is in remission. Pt was appreciative and verbalized understanding.

## 2020-01-15 ENCOUNTER — Encounter: Payer: Self-pay | Admitting: Podiatry

## 2020-01-15 ENCOUNTER — Other Ambulatory Visit: Payer: Self-pay

## 2020-01-15 ENCOUNTER — Ambulatory Visit (INDEPENDENT_AMBULATORY_CARE_PROVIDER_SITE_OTHER): Payer: Medicare Other | Admitting: Podiatry

## 2020-01-15 DIAGNOSIS — Q828 Other specified congenital malformations of skin: Secondary | ICD-10-CM

## 2020-01-15 DIAGNOSIS — B351 Tinea unguium: Secondary | ICD-10-CM | POA: Diagnosis not present

## 2020-01-15 DIAGNOSIS — M79676 Pain in unspecified toe(s): Secondary | ICD-10-CM

## 2020-01-15 NOTE — Progress Notes (Signed)
He presents today chief complaint of painfully elongated toenails bilaterally.  Objective: Vital signs are stable he is alert and oriented x3.  There is no erythema edema cellulitis drainage or odor.  Toenails bilaterally demonstrate thick yellow dystrophic clinically mycotic nails sharply incurvated nail margins and tenderness on palpation.  Assessment: Pain in limb secondary to onychomycosis.  Plan: Debridement of toenails bilateral foot in total.

## 2020-01-17 ENCOUNTER — Encounter (HOSPITAL_COMMUNITY)
Admission: RE | Admit: 2020-01-17 | Discharge: 2020-01-17 | Disposition: A | Discharge status: Home / Self Care | Payer: Medicare Other | Source: Ambulatory Visit | Attending: Urology | Admitting: Urology

## 2020-01-17 ENCOUNTER — Other Ambulatory Visit: Payer: Self-pay

## 2020-01-17 ENCOUNTER — Ambulatory Visit (HOSPITAL_COMMUNITY)
Admission: RE | Admit: 2020-01-17 | Discharge: 2020-01-17 | Disposition: A | Discharge status: Home / Self Care | Payer: Medicare Other | Source: Ambulatory Visit | Attending: Urology | Admitting: Urology

## 2020-01-17 DIAGNOSIS — C61 Malignant neoplasm of prostate: Secondary | ICD-10-CM | POA: Insufficient documentation

## 2020-01-17 MED ORDER — TECHNETIUM TC 99M MEDRONATE IV KIT
20.3000 | PACK | Freq: Once | INTRAVENOUS | Status: AC | PRN
  Administered 2020-01-17: 20.3 via INTRAVENOUS

## 2020-01-25 ENCOUNTER — Other Ambulatory Visit: Payer: Self-pay

## 2020-01-25 MED ORDER — LENALIDOMIDE 5 MG PO CAPS: ORAL_CAPSULE | ORAL | 0 refills | Status: DC

## 2020-01-26 ENCOUNTER — Other Ambulatory Visit: Payer: Self-pay | Admitting: Hematology and Oncology

## 2020-02-16 ENCOUNTER — Other Ambulatory Visit: Payer: Self-pay

## 2020-02-16 ENCOUNTER — Inpatient Hospital Stay: Payer: Medicare Other | Attending: Hematology and Oncology

## 2020-02-16 DIAGNOSIS — C9001 Multiple myeloma in remission: Secondary | ICD-10-CM | POA: Insufficient documentation

## 2020-02-16 DIAGNOSIS — Z452 Encounter for adjustment and management of vascular access device: Secondary | ICD-10-CM | POA: Diagnosis not present

## 2020-02-16 DIAGNOSIS — Z95828 Presence of other vascular implants and grafts: Secondary | ICD-10-CM

## 2020-02-16 MED ORDER — SODIUM CHLORIDE 0.9% FLUSH
10.0000 mL | INTRAVENOUS | Status: DC | PRN
  Administered 2020-02-16: 10 mL
  Filled 2020-02-16: qty 10

## 2020-02-16 MED ORDER — HEPARIN SOD (PORK) LOCK FLUSH 100 UNIT/ML IV SOLN
500.0000 [IU] | Freq: Once | INTRAVENOUS | Status: AC | PRN
  Administered 2020-02-16: 500 [IU] via INTRAVENOUS
  Filled 2020-02-16: qty 5

## 2020-02-16 NOTE — Patient Instructions (Signed)

## 2020-02-26 ENCOUNTER — Other Ambulatory Visit: Payer: Self-pay | Admitting: Hematology and Oncology

## 2020-02-26 NOTE — Telephone Encounter (Signed)
Pls refill electronically

## 2020-03-13 ENCOUNTER — Other Ambulatory Visit: Payer: Self-pay

## 2020-03-13 MED ORDER — LENALIDOMIDE 5 MG PO CAPS: ORAL_CAPSULE | ORAL | 0 refills | Status: DC

## 2020-04-05 ENCOUNTER — Inpatient Hospital Stay (HOSPITAL_BASED_OUTPATIENT_CLINIC_OR_DEPARTMENT_OTHER): Payer: Medicare Other | Admitting: Hematology and Oncology

## 2020-04-05 ENCOUNTER — Inpatient Hospital Stay: Payer: Medicare Other

## 2020-04-05 ENCOUNTER — Inpatient Hospital Stay: Payer: Medicare Other | Attending: Hematology and Oncology

## 2020-04-05 ENCOUNTER — Other Ambulatory Visit: Payer: Self-pay

## 2020-04-05 ENCOUNTER — Encounter: Payer: Self-pay | Admitting: Hematology and Oncology

## 2020-04-05 DIAGNOSIS — C9000 Multiple myeloma not having achieved remission: Secondary | ICD-10-CM

## 2020-04-05 DIAGNOSIS — C9001 Multiple myeloma in remission: Secondary | ICD-10-CM

## 2020-04-05 DIAGNOSIS — N183 Chronic kidney disease, stage 3 unspecified: Secondary | ICD-10-CM

## 2020-04-05 DIAGNOSIS — C61 Malignant neoplasm of prostate: Secondary | ICD-10-CM

## 2020-04-05 DIAGNOSIS — Z95828 Presence of other vascular implants and grafts: Secondary | ICD-10-CM

## 2020-04-05 DIAGNOSIS — D61818 Other pancytopenia: Secondary | ICD-10-CM

## 2020-04-05 DIAGNOSIS — Z85828 Personal history of other malignant neoplasm of skin: Secondary | ICD-10-CM

## 2020-04-05 LAB — CBC WITH DIFFERENTIAL/PLATELET
Abs Immature Granulocytes: 0.01 10*3/uL (ref 0.00–0.07)
Basophils Absolute: 0.1 10*3/uL (ref 0.0–0.1)
Basophils Relative: 3 %
Eosinophils Absolute: 0.4 10*3/uL (ref 0.0–0.5)
Eosinophils Relative: 9 %
HCT: 35.2 % — ABNORMAL LOW (ref 39.0–52.0)
Hemoglobin: 12.1 g/dL — ABNORMAL LOW (ref 13.0–17.0)
Immature Granulocytes: 0 %
Lymphocytes Relative: 23 %
Lymphs Abs: 1 10*3/uL (ref 0.7–4.0)
MCH: 34.7 pg — ABNORMAL HIGH (ref 26.0–34.0)
MCHC: 34.4 g/dL (ref 30.0–36.0)
MCV: 100.9 fL — ABNORMAL HIGH (ref 80.0–100.0)
Monocytes Absolute: 0.4 10*3/uL (ref 0.1–1.0)
Monocytes Relative: 9 %
Neutro Abs: 2.4 10*3/uL (ref 1.7–7.7)
Neutrophils Relative %: 56 %
Platelets: 132 10*3/uL — ABNORMAL LOW (ref 150–400)
RBC: 3.49 MIL/uL — ABNORMAL LOW (ref 4.22–5.81)
RDW: 14.2 % (ref 11.5–15.5)
WBC: 4.2 10*3/uL (ref 4.0–10.5)
nRBC: 0 % (ref 0.0–0.2)

## 2020-04-05 LAB — COMPREHENSIVE METABOLIC PANEL
ALT: 14 U/L (ref 0–44)
AST: 21 U/L (ref 15–41)
Albumin: 3.3 g/dL — ABNORMAL LOW (ref 3.5–5.0)
Alkaline Phosphatase: 49 U/L (ref 38–126)
Anion gap: 8 (ref 5–15)
BUN: 18 mg/dL (ref 8–23)
CO2: 26 mmol/L (ref 22–32)
Calcium: 9 mg/dL (ref 8.9–10.3)
Chloride: 106 mmol/L (ref 98–111)
Creatinine, Ser: 1.36 mg/dL — ABNORMAL HIGH (ref 0.61–1.24)
GFR calc Af Amer: 59 mL/min — ABNORMAL LOW (ref >60)
GFR calc non Af Amer: 51 mL/min — ABNORMAL LOW (ref >60)
Glucose, Bld: 122 mg/dL — ABNORMAL HIGH (ref 70–99)
Potassium: 3.2 mmol/L — ABNORMAL LOW (ref 3.5–5.1)
Sodium: 140 mmol/L (ref 135–145)
Total Bilirubin: 0.7 mg/dL (ref 0.3–1.2)
Total Protein: 7 g/dL (ref 6.5–8.1)

## 2020-04-05 MED ORDER — SODIUM CHLORIDE 0.9 % IV SOLN
INTRAVENOUS | Status: DC
  Filled 2020-04-05: qty 250

## 2020-04-05 MED ORDER — ZOLEDRONIC ACID 4 MG/5ML IV CONC
3.5000 mg | Freq: Once | INTRAVENOUS | Status: AC
  Administered 2020-04-05: 3.5 mg via INTRAVENOUS
  Filled 2020-04-05: qty 4.38

## 2020-04-05 MED ORDER — SODIUM CHLORIDE 0.9% FLUSH
10.0000 mL | INTRAVENOUS | Status: DC | PRN
  Administered 2020-04-05: 10 mL
  Filled 2020-04-05: qty 10

## 2020-04-05 NOTE — Assessment & Plan Note (Signed)
He has mild stable CKD I emphasized the importance of adequate fluid hydration

## 2020-04-05 NOTE — Assessment & Plan Note (Signed)
He is currently on low-dose Revlimid as a form of maintenance treatment In the meantime, he will continue aspirin, calcium with vitamin D He denies recent dental issues. He will continue Zometa every 3 months, reduced dose due to renal function

## 2020-04-05 NOTE — Progress Notes (Signed)
[place] OFFICE PROGRESS NOTE  Patient Care Team: Jinny Sanders, MD as PCP - General Jeanann Lewandowsky, MD as Consulting Physician (Hematology and Oncology) Cathlean Marseilles, NP as Nurse Practitioner (Hematology and Oncology) Raynelle Bring, MD as Consulting Physician (Urology) Heath Lark, MD as Consulting Physician (Hematology and Oncology) [place], Romilda Garret, [place] as Consulting Physician (Podiatry) Morton Stall, Howell Rucks, NP as Nurse Practitioner (Pain Medicine) Druscilla Brownie, MD as Consulting Physician (Dermatology) Bryson Ha, OD as Consulting Physician (Optometry)  ASSESSMENT & PLAN:  Multiple myeloma in remission [facility]) He is currently on low-dose Revlimid as a form of maintenance treatment In the meantime, he will continue aspirin, calcium with vitamin D He denies recent dental issues. He will continue Zometa every 3 months, reduced dose due to renal function  Acquired pancytopenia ([place]) This is likely due to recent treatment. The patient denies recent history of bleeding such as epistaxis, hematuria or hematochezia. He is asymptomatic from the low platelet count. I will observe for now.   We will continue low-dose Revlimid as prescribed.  Chronic kidney disease, stage III (moderate) He has mild stable CKD I emphasized the importance of adequate fluid hydration   Malignant neoplasm of prostate ([place]) He is undergoing extensive evaluation with urologist regarding prostate cancer follow-up I would defer to them for further management and follow-up  History of skin cancer We discussed the importance of aggressive sun protection He will continue close follow-up with dermatologist   No orders of the defined types were placed in this encounter.   All questions were answered. The patient knows to call the clinic with any problems, questions or concerns. The total time spent in the appointment was 20 minutes encounter with patients including review of chart  and various tests results, discussions about plan of care and coordination of care plan   Heath Lark, MD 04/05/2020 10:55 AM  INTERVAL HISTORY: Please see below for problem oriented charting. He returns for further follow-up and Zometa treatment His myeloma is stable He complained of recent intermittent right groin pain.  He was evaluated by pain management clinic at [facility] He felt that his current prescribed pain medicine  He is undergoing further testing due to elevated PSA with urology service He is compliant taking Revlimid as prescribed No recent infection, fever or chills No recent dental issues  SUMMARY OF ONCOLOGIC HISTORY:  1. Velcade, Cytoxan, and Decadron on a weekly basis from 07/10/2011 through 10/23/2011; 2. High-dose chemotherapy on 11/23/2011, consisting of carmustine 600 mg IV. He received cytarabine 400 mg IV on 12/24/2011, 12/25/2011, 12/26/2011, and 12/27/2011. He received etoposide 300 mg IV daily from 12/24/2011 through 12/27/2011, four doses. He received melphalan 280 mg IV on 12/28/2011.  3. 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  Current therapy: -Revlimid 5 mg daily, 3 weeks on, 1 week off. Revlimid was started on 05/11/2012.  Zometa was started on 09/02/2012.  Dental clearance was obtained.  He was diagnosed with prostate cancer and received treatment through urology service  REVIEW OF SYSTEMS:   Constitutional: Denies fevers, chills or abnormal weight loss Eyes: Denies blurriness of vision Ears, nose, mouth, throat, and face: Denies mucositis or sore throat Respiratory: Denies cough, dyspnea or wheezes Cardiovascular: Denies palpitation, chest discomfort or lower extremity swelling Gastrointestinal:  Denies nausea, heartburn or change in bowel habits Skin: Denies abnormal skin rashes Lymphatics: Denies new lymphadenopathy or easy bruising Neurological:Denies numbness, tingling or new weaknesses Behavioral/Psych: Mood is stable, no new  changes  All other  systems were reviewed with the patient and are negative.  I have reviewed the past medical history, past surgical history, social history and family history with the patient and they are unchanged from previous note.  ALLERGIES:  is allergic to celebrex [celecoxib], cymbalta [duloxetine hcl], hydromorphone hcl, meloxicam, oxycontin [oxycodone], percocet [oxycodone-acetaminophen], vicodin [hydrocodone-acetaminophen], and lyrica [pregabalin].  MEDICATIONS:  Current Outpatient Medications  Medication Sig Dispense Refill  . aspirin 81 MG tablet Take 81 mg by mouth daily.    . B Complex Vitamins (VITAMIN-B COMPLEX) TABS Take by mouth.    . Calcium Carbonate-Vitamin D 600-400 MG-UNIT per tablet Take by mouth.    . cholecalciferol (VITAMIN D) 1000 UNITS tablet Take 1,000 Units by mouth daily.    . fentaNYL (DURAGESIC) 75 MCG/HR APP 1 PA EXT TO THE SKIN QOD    . hydrocortisone 2.5 % ointment APP AA BID    . lenalidomide (REVLIMID) 5 MG capsule Take 1 capsule by mouth every day for 21 days then 7 days off. 21 capsule 0  . lidocaine-prilocaine (EMLA) cream Apply topically as needed. Apply to port site one hour before treatment and cover with plastic wrap 30 g 2  . NONFORMULARY OR COMPOUNDED ITEM Baclofn/amitrp/ketamn cream topically to feet TID prn for neuropathy    . oxybutynin (DITROPAN) 5 MG tablet Take 5 mg by mouth 2 (two) times daily.    Marland Kitchen oxyCODONE (OXY IR/ROXICODONE) 5 MG immediate release tablet TK 1 T PO Q 4 H PRN P  0  . pantoprazole (PROTONIX) 40 MG tablet TAKE 1 TABLET BY MOUTH EVERY DAY 90 tablet 3   No current facility-administered medications for this visit.    PHYSICAL EXAMINATION: ECOG PERFORMANCE STATUS: 1 - Symptomatic but completely ambulatory  Vitals:   04/05/20 1049  BP: 140/71  Pulse: 67  Resp: 18  Temp: 97.8 F (36.6 C)  SpO2: 100%   Filed Weights   04/05/20 1049  Weight: 193 lb 3.2 oz (87.6 kg)    GENERAL:alert, no distress and  comfortable SKIN: skin color, texture, turgor are normal, no rashes or significant lesions EYES: normal, Conjunctiva are pink and non-injected, sclera clear OROPHARYNX:no exudate, no erythema and lips, buccal mucosa, and tongue normal  NECK: supple, thyroid normal size, non-tender, without nodularity LYMPH:  no palpable lymphadenopathy in the cervical, axillary or inguinal LUNGS: clear to auscultation and percussion with normal breathing effort HEART: regular rate & rhythm and no murmurs and no lower extremity edema ABDOMEN:abdomen soft, non-tender and normal bowel sounds Musculoskeletal:no cyanosis of digits and no clubbing  NEURO: alert & oriented x 3 with fluent speech, no focal motor/sensory deficits  LABORATORY DATA:  I have reviewed the data as listed    Component Value Date/Time   NA 141 01/05/2020 0959   NA 139 05/14/2017 1145   K 3.7 01/05/2020 0959   K 3.8 05/14/2017 1145   CL 109 01/05/2020 0959   CL 108 (H) 09/30/2013 0945   CL 110 (H) 12/29/2012 1151   CO2 25 01/05/2020 0959   CO2 27 05/14/2017 1145   GLUCOSE 107 (H) 01/05/2020 0959   GLUCOSE 109 05/14/2017 1145   GLUCOSE 127 (H) 12/29/2012 1151   BUN 20 01/05/2020 0959   BUN 17.9 05/14/2017 1145   CREATININE 1.53 (H) 01/05/2020 0959   CREATININE 1.3 05/14/2017 1145   CALCIUM 9.0 01/05/2020 0959   CALCIUM 9.3 05/14/2017 1145   PROT 6.5 01/05/2020 0959   PROT 6.2 05/14/2017 1145   PROT 6.6 05/14/2017 1145  ALBUMIN 3.2 (L) 01/05/2020 0959   ALBUMIN 3.3 (L) 05/14/2017 1145   AST 19 01/05/2020 0959   AST 19 05/14/2017 1145   ALT 13 01/05/2020 0959   ALT 18 05/14/2017 1145   ALKPHOS 49 01/05/2020 0959   ALKPHOS 45 05/14/2017 1145   BILITOT 0.7 01/05/2020 0959   BILITOT 0.67 05/14/2017 1145   GFRNONAA 44 (L) 01/05/2020 0959   GFRNONAA >60 09/30/2013 0945   GFRAA 51 (L) 01/05/2020 0959   GFRAA >60 09/30/2013 0945    No results found for: SPEP, UPEP  Lab Results  Component Value Date   WBC 4.2 04/05/2020    NEUTROABS 2.4 04/05/2020   HGB 12.1 (L) 04/05/2020   HCT 35.2 (L) 04/05/2020   MCV 100.9 (H) 04/05/2020   PLT 132 (L) 04/05/2020      Chemistry      Component Value Date/Time   NA 141 01/05/2020 0959   NA 139 05/14/2017 1145   K 3.7 01/05/2020 0959   K 3.8 05/14/2017 1145   CL 109 01/05/2020 0959   CL 108 (H) 09/30/2013 0945   CL 110 (H) 12/29/2012 1151   CO2 25 01/05/2020 0959   CO2 27 05/14/2017 1145   BUN 20 01/05/2020 0959   BUN 17.9 05/14/2017 1145   CREATININE 1.53 (H) 01/05/2020 0959   CREATININE 1.3 05/14/2017 1145      Component Value Date/Time   CALCIUM 9.0 01/05/2020 0959   CALCIUM 9.3 05/14/2017 1145   ALKPHOS 49 01/05/2020 0959   ALKPHOS 45 05/14/2017 1145   AST 19 01/05/2020 0959   AST 19 05/14/2017 1145   ALT 13 01/05/2020 0959   ALT 18 05/14/2017 1145   BILITOT 0.7 01/05/2020 0959   BILITOT 0.67 05/14/2017 1145

## 2020-04-05 NOTE — Assessment & Plan Note (Signed)
He is undergoing extensive evaluation with urologist regarding prostate cancer follow-up I would defer to them for further management and follow-up

## 2020-04-05 NOTE — Assessment & Plan Note (Signed)
This is likely due to recent treatment. The patient denies recent history of bleeding such as epistaxis, hematuria or hematochezia. He is asymptomatic from the low platelet count. I will observe for now.   We will continue low-dose Revlimid as prescribed.

## 2020-04-05 NOTE — Assessment & Plan Note (Signed)
We discussed the importance of aggressive sun protection He will continue close follow-up with dermatologist

## 2020-04-05 NOTE — Patient Instructions (Signed)

## 2020-04-08 LAB — MULTIPLE MYELOMA PANEL, SERUM
Albumin SerPl Elph-Mcnc: 3.3 g/dL (ref 2.9–4.4)
Albumin/Glob SerPl: 1.1 (ref 0.7–1.7)
Alpha 1: 0.2 g/dL (ref 0.0–0.4)
Alpha2 Glob SerPl Elph-Mcnc: 0.6 g/dL (ref 0.4–1.0)
B-Globulin SerPl Elph-Mcnc: 0.8 g/dL (ref 0.7–1.3)
Gamma Glob SerPl Elph-Mcnc: 1.4 g/dL (ref 0.4–1.8)
Globulin, Total: 3.1 g/dL (ref 2.2–3.9)
IgA: 291 mg/dL (ref 61–437)
IgG (Immunoglobin G), Serum: 1396 mg/dL (ref 603–1613)
IgM (Immunoglobulin M), Srm: 20 mg/dL (ref 15–143)
Total Protein ELP: 6.4 g/dL (ref 6.0–8.5)

## 2020-04-08 LAB — KAPPA/LAMBDA LIGHT CHAINS
Kappa free light chain: 80.5 mg/L — ABNORMAL HIGH (ref 3.3–19.4)
Kappa, lambda light chain ratio: 1.66 — ABNORMAL HIGH (ref 0.26–1.65)
Lambda free light chains: 48.5 mg/L — ABNORMAL HIGH (ref 5.7–26.3)

## 2020-04-09 ENCOUNTER — Telehealth: Payer: Self-pay

## 2020-04-09 NOTE — Telephone Encounter (Signed)
-----   Message from Heath Lark, MD sent at 04/09/2020  8:51 AM EDT ----- Regarding: pls let him know myeloma panel is stabkle

## 2020-04-09 NOTE — Telephone Encounter (Signed)
Called and left below message. Ask him to call the office back for questions.

## 2020-04-17 ENCOUNTER — Ambulatory Visit: Payer: Medicare Other | Admitting: Podiatry

## 2020-04-19 ENCOUNTER — Telehealth: Payer: Self-pay

## 2020-04-19 NOTE — Telephone Encounter (Signed)
Refill request faxed to [facility] [phone]

## 2020-04-22 ENCOUNTER — Other Ambulatory Visit: Payer: Self-pay

## 2020-04-22 MED ORDER — LENALIDOMIDE 5 MG PO CAPS: ORAL_CAPSULE | ORAL | 0 refills | Status: DC

## 2020-05-01 ENCOUNTER — Encounter: Payer: Self-pay | Admitting: *Deleted

## 2020-05-01 DIAGNOSIS — C4492 Squamous cell carcinoma of skin, unspecified: Secondary | ICD-10-CM | POA: Insufficient documentation

## 2020-05-02 ENCOUNTER — Other Ambulatory Visit (HOSPITAL_COMMUNITY): Payer: Self-pay | Admitting: Urology

## 2020-05-02 DIAGNOSIS — C61 Malignant neoplasm of prostate: Secondary | ICD-10-CM

## 2020-05-06 ENCOUNTER — Encounter: Payer: Self-pay | Admitting: Podiatry

## 2020-05-06 ENCOUNTER — Other Ambulatory Visit: Payer: Self-pay

## 2020-05-06 ENCOUNTER — Ambulatory Visit (INDEPENDENT_AMBULATORY_CARE_PROVIDER_SITE_OTHER): Payer: Medicare Other | Admitting: Podiatry

## 2020-05-06 DIAGNOSIS — Q828 Other specified congenital malformations of skin: Secondary | ICD-10-CM | POA: Diagnosis not present

## 2020-05-06 DIAGNOSIS — B351 Tinea unguium: Secondary | ICD-10-CM

## 2020-05-06 DIAGNOSIS — M79676 Pain in unspecified toe(s): Secondary | ICD-10-CM | POA: Diagnosis not present

## 2020-05-06 NOTE — Progress Notes (Signed)
He presents today chief complaint of painful elongated toenails 1 through 5 bilaterally.  Objective: Toenails 1 through 5 bilateral are thick yellow dystrophic Lee mycotic painful palpation.  Assessment: Pain in secondary to onychomycosis bilaterally.  Plan: Debridement of toenails bilateral.

## 2020-05-14 ENCOUNTER — Other Ambulatory Visit: Payer: Self-pay

## 2020-05-14 ENCOUNTER — Ambulatory Visit (HOSPITAL_COMMUNITY)
Admission: RE | Admit: 2020-05-14 | Discharge: 2020-05-14 | Disposition: A | Discharge status: Home / Self Care | Payer: Medicare Other | Source: Ambulatory Visit | Attending: Urology | Admitting: Urology

## 2020-05-14 DIAGNOSIS — C61 Malignant neoplasm of prostate: Secondary | ICD-10-CM | POA: Insufficient documentation

## 2020-05-14 MED ORDER — PIFLIFOLASTAT F 18 (PYLARIFY) INJECTION
9.0000 | Freq: Once | INTRAVENOUS | Status: AC
  Administered 2020-05-14: 9.5 via INTRAVENOUS

## 2020-05-14 MED ORDER — FLUDEOXYGLUCOSE F - 18 (FDG) INJECTION: 9.5000 | Freq: Once | INTRAVENOUS | Status: DC | PRN

## 2020-05-17 ENCOUNTER — Other Ambulatory Visit: Payer: Self-pay

## 2020-05-17 ENCOUNTER — Inpatient Hospital Stay: Payer: Medicare Other | Attending: Hematology and Oncology

## 2020-05-17 DIAGNOSIS — Z95828 Presence of other vascular implants and grafts: Secondary | ICD-10-CM

## 2020-05-17 DIAGNOSIS — Z452 Encounter for adjustment and management of vascular access device: Secondary | ICD-10-CM | POA: Insufficient documentation

## 2020-05-17 DIAGNOSIS — C9001 Multiple myeloma in remission: Secondary | ICD-10-CM | POA: Diagnosis present

## 2020-05-17 MED ORDER — HEPARIN SOD (PORK) LOCK FLUSH 100 UNIT/ML IV SOLN
500.0000 [IU] | Freq: Once | INTRAVENOUS | Status: AC | PRN
  Administered 2020-05-17: 500 [IU] via INTRAVENOUS
  Filled 2020-05-17: qty 5

## 2020-05-17 MED ORDER — SODIUM CHLORIDE 0.9% FLUSH
10.0000 mL | INTRAVENOUS | Status: DC | PRN
  Administered 2020-05-17: 10 mL
  Filled 2020-05-17: qty 10

## 2020-05-17 NOTE — Patient Instructions (Signed)

## 2020-05-21 ENCOUNTER — Other Ambulatory Visit: Payer: Self-pay

## 2020-05-21 MED ORDER — LENALIDOMIDE 5 MG PO CAPS: ORAL_CAPSULE | ORAL | 0 refills | Status: DC

## 2020-06-12 ENCOUNTER — Other Ambulatory Visit: Payer: Self-pay | Admitting: Podiatry

## 2020-06-12 DIAGNOSIS — Z9889 Other specified postprocedural states: Secondary | ICD-10-CM

## 2020-06-12 DIAGNOSIS — M216X2 Other acquired deformities of left foot: Secondary | ICD-10-CM

## 2020-06-14 ENCOUNTER — Other Ambulatory Visit: Payer: Self-pay

## 2020-06-14 MED ORDER — LENALIDOMIDE 5 MG PO CAPS: ORAL_CAPSULE | ORAL | 0 refills | Status: DC

## 2020-06-28 ENCOUNTER — Other Ambulatory Visit: Payer: Self-pay

## 2020-06-28 ENCOUNTER — Inpatient Hospital Stay: Payer: Medicare Other | Attending: Hematology and Oncology

## 2020-06-28 ENCOUNTER — Inpatient Hospital Stay: Payer: Medicare Other

## 2020-06-28 ENCOUNTER — Inpatient Hospital Stay (HOSPITAL_BASED_OUTPATIENT_CLINIC_OR_DEPARTMENT_OTHER): Payer: Medicare Other | Admitting: Hematology and Oncology

## 2020-06-28 ENCOUNTER — Encounter: Payer: Self-pay | Admitting: Hematology and Oncology

## 2020-06-28 DIAGNOSIS — Z95828 Presence of other vascular implants and grafts: Secondary | ICD-10-CM

## 2020-06-28 DIAGNOSIS — D61818 Other pancytopenia: Secondary | ICD-10-CM

## 2020-06-28 DIAGNOSIS — C9001 Multiple myeloma in remission: Secondary | ICD-10-CM | POA: Insufficient documentation

## 2020-06-28 DIAGNOSIS — C9000 Multiple myeloma not having achieved remission: Secondary | ICD-10-CM

## 2020-06-28 DIAGNOSIS — N183 Chronic kidney disease, stage 3 unspecified: Secondary | ICD-10-CM | POA: Diagnosis not present

## 2020-06-28 DIAGNOSIS — C61 Malignant neoplasm of prostate: Secondary | ICD-10-CM | POA: Diagnosis not present

## 2020-06-28 LAB — CBC WITH DIFFERENTIAL/PLATELET
Abs Immature Granulocytes: 0.01 10*3/uL (ref 0.00–0.07)
Basophils Absolute: 0.1 10*3/uL (ref 0.0–0.1)
Basophils Relative: 2 %
Eosinophils Absolute: 0.3 10*3/uL (ref 0.0–0.5)
Eosinophils Relative: 7 %
HCT: 34.4 % — ABNORMAL LOW (ref 39.0–52.0)
Hemoglobin: 11.8 g/dL — ABNORMAL LOW (ref 13.0–17.0)
Immature Granulocytes: 0 %
Lymphocytes Relative: 27 %
Lymphs Abs: 1.2 10*3/uL (ref 0.7–4.0)
MCH: 34.8 pg — ABNORMAL HIGH (ref 26.0–34.0)
MCHC: 34.3 g/dL (ref 30.0–36.0)
MCV: 101.5 fL — ABNORMAL HIGH (ref 80.0–100.0)
Monocytes Absolute: 0.3 10*3/uL (ref 0.1–1.0)
Monocytes Relative: 7 %
Neutro Abs: 2.5 10*3/uL (ref 1.7–7.7)
Neutrophils Relative %: 57 %
Platelets: 116 10*3/uL — ABNORMAL LOW (ref 150–400)
RBC: 3.39 MIL/uL — ABNORMAL LOW (ref 4.22–5.81)
RDW: 14.3 % (ref 11.5–15.5)
WBC: 4.4 10*3/uL (ref 4.0–10.5)
nRBC: 0 % (ref 0.0–0.2)

## 2020-06-28 LAB — COMPREHENSIVE METABOLIC PANEL
ALT: 14 U/L (ref 0–44)
AST: 22 U/L (ref 15–41)
Albumin: 3.1 g/dL — ABNORMAL LOW (ref 3.5–5.0)
Alkaline Phosphatase: 49 U/L (ref 38–126)
Anion gap: 7 (ref 5–15)
BUN: 19 mg/dL (ref 8–23)
CO2: 26 mmol/L (ref 22–32)
Calcium: 8.7 mg/dL — ABNORMAL LOW (ref 8.9–10.3)
Chloride: 108 mmol/L (ref 98–111)
Creatinine, Ser: 1.32 mg/dL — ABNORMAL HIGH (ref 0.61–1.24)
GFR, Estimated: 57 mL/min — ABNORMAL LOW (ref >60)
Glucose, Bld: 121 mg/dL — ABNORMAL HIGH (ref 70–99)
Potassium: 3.6 mmol/L (ref 3.5–5.1)
Sodium: 141 mmol/L (ref 135–145)
Total Bilirubin: 0.7 mg/dL (ref 0.3–1.2)
Total Protein: 6.6 g/dL (ref 6.5–8.1)

## 2020-06-28 MED ORDER — SODIUM CHLORIDE 0.9 % IV SOLN
Freq: Once | INTRAVENOUS | Status: AC
  Filled 2020-06-28: qty 250

## 2020-06-28 MED ORDER — ZOLEDRONIC ACID 4 MG/5ML IV CONC
3.5000 mg | Freq: Once | INTRAVENOUS | Status: AC
  Administered 2020-06-28: 3.5 mg via INTRAVENOUS
  Filled 2020-06-28: qty 4.38

## 2020-06-28 MED ORDER — SODIUM CHLORIDE 0.9% FLUSH
10.0000 mL | INTRAVENOUS | Status: DC | PRN
  Administered 2020-06-28: 10 mL
  Filled 2020-06-28: qty 10

## 2020-06-28 MED ORDER — HEPARIN SOD (PORK) LOCK FLUSH 100 UNIT/ML IV SOLN
500.0000 [IU] | Freq: Once | INTRAVENOUS | Status: AC | PRN
  Administered 2020-06-28: 500 [IU] via INTRAVENOUS
  Filled 2020-06-28: qty 5

## 2020-06-28 NOTE — Assessment & Plan Note (Signed)
He is currently on low-dose Revlimid as a form of maintenance treatment In the meantime, he will continue aspirin, calcium with vitamin D He denies recent dental issues. The patient is noted to have been on Zometa for more than 7 years I recommend spacing out the frequency of Zometa to every 4 months

## 2020-06-28 NOTE — Assessment & Plan Note (Signed)
He has mild stable CKD I emphasized the importance of adequate fluid hydration

## 2020-06-28 NOTE — Assessment & Plan Note (Signed)
According to the patient, his PSA is trending up His recent PET CT scan showed no evidence of measurable disease He has appointment to follow-up with urologist next month

## 2020-06-28 NOTE — Progress Notes (Signed)
Patient stable upon leaving infusion room.

## 2020-06-28 NOTE — Patient Instructions (Signed)
Zoledronic Acid injection (Hypercalcemia, Oncology) What is this medicine? ZOLEDRONIC ACID (ZOE le dron ik AS id) lowers the amount of calcium loss from bone. It is used to treat too much calcium in your blood from cancer. It is also used to prevent complications of cancer that has spread to the bone. This medicine may be used for other purposes; ask your health care provider or pharmacist if you have questions. COMMON BRAND NAME(S): Zometa What should I tell my health care provider before I take this medicine? They need to know if you have any of these conditions:  aspirin-sensitive asthma  cancer, especially if you are receiving medicines used to treat cancer  dental disease or wear dentures  infection  kidney disease  receiving corticosteroids like dexamethasone or prednisone  an unusual or allergic reaction to zoledronic acid, other medicines, foods, dyes, or preservatives  pregnant or trying to get pregnant  breast-feeding How should I use this medicine? This medicine is for infusion into a vein. It is given by a health care professional in a hospital or clinic setting. Talk to your pediatrician regarding the use of this medicine in children. Special care may be needed. Overdosage: If you think you have taken too much of this medicine contact a poison control center or emergency room at once. NOTE: This medicine is only for you. Do not share this medicine with others. What if I miss a dose? It is important not to miss your dose. Call your doctor or health care professional if you are unable to keep an appointment. What may interact with this medicine?  certain antibiotics given by injection  NSAIDs, medicines for pain and inflammation, like ibuprofen or naproxen  some diuretics like bumetanide, furosemide  teriparatide  thalidomide This list may not describe all possible interactions. Give your health care provider a list of all the medicines, herbs, non-prescription  drugs, or dietary supplements you use. Also tell them if you smoke, drink alcohol, or use illegal drugs. Some items may interact with your medicine. What should I watch for while using this medicine? Visit your doctor or health care professional for regular checkups. It may be some time before you see the benefit from this medicine. Do not stop taking your medicine unless your doctor tells you to. Your doctor may order blood tests or other tests to see how you are doing. Women should inform their doctor if they wish to become pregnant or think they might be pregnant. There is a potential for serious side effects to an unborn child. Talk to your health care professional or pharmacist for more information. You should make sure that you get enough calcium and vitamin D while you are taking this medicine. Discuss the foods you eat and the vitamins you take with your health care professional. Some people who take this medicine have severe bone, joint, and/or muscle pain. This medicine may also increase your risk for jaw problems or a broken thigh bone. Tell your doctor right away if you have severe pain in your jaw, bones, joints, or muscles. Tell your doctor if you have any pain that does not go away or that gets worse. Tell your dentist and dental surgeon that you are taking this medicine. You should not have major dental surgery while on this medicine. See your dentist to have a dental exam and fix any dental problems before starting this medicine. Take good care of your teeth while on this medicine. Make sure you see your dentist for regular follow-up   appointments. What side effects may I notice from receiving this medicine? Side effects that you should report to your doctor or health care professional as soon as possible:  allergic reactions like skin rash, itching or hives, swelling of the face, lips, or tongue  anxiety, confusion, or depression  breathing problems  changes in vision  eye  pain  feeling faint or lightheaded, falls  jaw pain, especially after dental work  mouth sores  muscle cramps, stiffness, or weakness  redness, blistering, peeling or loosening of the skin, including inside the mouth  trouble passing urine or change in the amount of urine Side effects that usually do not require medical attention (report to your doctor or health care professional if they continue or are bothersome):  bone, joint, or muscle pain  constipation  diarrhea  fever  hair loss  irritation at site where injected  loss of appetite  nausea, vomiting  stomach upset  trouble sleeping  trouble swallowing  weak or tired This list may not describe all possible side effects. Call your doctor for medical advice about side effects. You may report side effects to FDA at 1-800-FDA-1088. Where should I keep my medicine? This drug is given in a hospital or clinic and will not be stored at home. NOTE: This sheet is a summary. It may not cover all possible information. If you have questions about this medicine, talk to your doctor, pharmacist, or health care provider.  2020 Elsevier/Gold Standard (2013-12-09 14:19:39)  

## 2020-06-28 NOTE — Progress Notes (Signed)
[facility] OFFICE PROGRESS NOTE  Patient Care Team: Jinny Sanders, MD as PCP - General Jeanann Lewandowsky, MD as Consulting Physician (Hematology and Oncology) Cathlean Marseilles, NP as Nurse Practitioner (Hematology and Oncology) Raynelle Bring, MD as Consulting Physician (Urology) Heath Lark, MD as Consulting Physician (Hematology and Oncology) [place], Romilda Garret, [place] as Consulting Physician (Podiatry) Morton Stall, Howell Rucks, NP as Nurse Practitioner (Pain Medicine) Druscilla Brownie, MD as Consulting Physician (Dermatology) Bryson Ha, OD as Consulting Physician (Optometry)  ASSESSMENT & PLAN:  Multiple myeloma in remission [facility]) He is currently on low-dose Revlimid as a form of maintenance treatment In the meantime, he will continue aspirin, calcium with vitamin D He denies recent dental issues. The patient is noted to have been on Zometa for more than 7 years I recommend spacing out the frequency of Zometa to every 4 months  Acquired pancytopenia ([place]) This is likely due to recent treatment. The patient denies recent history of bleeding such as epistaxis, hematuria or hematochezia. He is asymptomatic from the low platelet count. I will observe for now.   We will continue low-dose Revlimid as prescribed.  Malignant neoplasm of prostate ([place]) According to the patient, his PSA is trending up His recent PET CT scan showed no evidence of measurable disease He has appointment to follow-up with urologist next month  Chronic kidney disease, stage III (moderate) He has mild stable CKD I emphasized the importance of adequate fluid hydration    No orders of the defined types were placed in this encounter.   All questions were answered. The patient knows to call the clinic with any problems, questions or concerns. The total time spent in the appointment was 20 minutes encounter with patients including review of chart and various tests results, discussions about plan of  care and coordination of care plan   Heath Lark, MD 06/28/2020 12:01 PM  INTERVAL HISTORY: Please see below for problem oriented charting. Patient returns for further follow-up He was told that his PSA was recently trending up Recent PET CT scan showed no evidence of active disease Clinically, he is not symptomatic No recent bone pain No recent dental issues His chronic pain is stable No recent infection, fever or chills  SUMMARY OF ONCOLOGIC HISTORY:  1. Velcade, Cytoxan, and Decadron on a weekly basis from 07/10/2011 through 10/23/2011; 2. High-dose chemotherapy on 11/23/2011, consisting of carmustine 600 mg IV. He received cytarabine 400 mg IV on 12/24/2011, 12/25/2011, 12/26/2011, and 12/27/2011. He received etoposide 300 mg IV daily from 12/24/2011 through 12/27/2011, four doses. He received melphalan 280 mg IV on 12/28/2011.  3. 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  Current therapy: -Revlimid 5 mg daily, 3 weeks on, 1 week off. Revlimid was started on 05/11/2012.  Zometa was started on 09/02/2012.  Dental clearance was obtained.  He was diagnosed with prostate cancer and received treatment through urology service  REVIEW OF SYSTEMS:   Constitutional: Denies fevers, chills or abnormal weight loss Eyes: Denies blurriness of vision Ears, nose, mouth, throat, and face: Denies mucositis or sore throat Respiratory: Denies cough, dyspnea or wheezes Cardiovascular: Denies palpitation, chest discomfort or lower extremity swelling Gastrointestinal:  Denies nausea, heartburn or change in bowel habits Skin: Denies abnormal skin rashes Lymphatics: Denies new lymphadenopathy or easy bruising Neurological:Denies numbness, tingling or new weaknesses Behavioral/Psych: Mood is stable, no new changes  All other systems were reviewed with the patient and are negative.  I have reviewed the past medical history, past surgical  history, social history and family history with  the patient and they are unchanged from previous note.  ALLERGIES:  is allergic to celebrex [celecoxib], cymbalta [duloxetine hcl], hydromorphone hcl, meloxicam, oxycontin [oxycodone], percocet [oxycodone-acetaminophen], vicodin [hydrocodone-acetaminophen], and lyrica [pregabalin].  MEDICATIONS:  Current Outpatient Medications  Medication Sig Dispense Refill  . aspirin 81 MG tablet Take 81 mg by mouth daily.    . B Complex Vitamins (VITAMIN-B COMPLEX) TABS Take by mouth.    . Calcium Carbonate-Vitamin D 600-400 MG-UNIT per tablet Take by mouth.    . cholecalciferol (VITAMIN D) 1000 UNITS tablet Take 1,000 Units by mouth daily.    . fentaNYL (DURAGESIC) 75 MCG/HR APP 1 PA EXT TO THE SKIN QOD    . lenalidomide (REVLIMID) 5 MG capsule Take 1 capsule by mouth every day for 21 days then 7 days off. 21 capsule 0  . lidocaine-prilocaine (EMLA) cream Apply topically as needed. Apply to port site one hour before treatment and cover with plastic wrap 30 g 2  . NONFORMULARY OR COMPOUNDED ITEM Baclofn/amitrp/ketamn cream topically to feet TID prn for neuropathy    . oxybutynin (DITROPAN) 5 MG tablet Take 5 mg by mouth 2 (two) times daily.    Marland Kitchen oxyCODONE (OXY IR/ROXICODONE) 5 MG immediate release tablet TK 1 T PO Q 4 H PRN P  0  . pantoprazole (PROTONIX) 40 MG tablet TAKE 1 TABLET BY MOUTH EVERY DAY 90 tablet 3   No current facility-administered medications for this visit.    PHYSICAL EXAMINATION: ECOG PERFORMANCE STATUS: 1 - Symptomatic but completely ambulatory  Vitals:   06/28/20 1133  BP: 130/66  Pulse: 65  Resp: 18  Temp: (!) 97.4 F (36.3 C)  SpO2: 100%   Filed Weights   06/28/20 1133  Weight: 190 lb 12.8 oz (86.5 kg)    GENERAL:alert, no distress and comfortable SKIN: skin color, texture, turgor are normal, no rashes or significant lesions EYES: normal, Conjunctiva are pink and non-injected, sclera clear OROPHARYNX:no exudate, no erythema and lips, buccal mucosa, and tongue normal   NECK: supple, thyroid normal size, non-tender, without nodularity LYMPH:  no palpable lymphadenopathy in the cervical, axillary or inguinal LUNGS: clear to auscultation and percussion with normal breathing effort HEART: regular rate & rhythm and no murmurs and no lower extremity edema ABDOMEN:abdomen soft, non-tender and normal bowel sounds Musculoskeletal:no cyanosis of digits and no clubbing  NEURO: alert & oriented x 3 with fluent speech, no focal motor/sensory deficits  LABORATORY DATA:  I have reviewed the data as listed    Component Value Date/Time   NA 141 06/28/2020 1105   NA 139 05/14/2017 1145   K 3.6 06/28/2020 1105   K 3.8 05/14/2017 1145   CL 108 06/28/2020 1105   CL 108 (H) 09/30/2013 0945   CL 110 (H) 12/29/2012 1151   CO2 26 06/28/2020 1105   CO2 27 05/14/2017 1145   GLUCOSE 121 (H) 06/28/2020 1105   GLUCOSE 109 05/14/2017 1145   GLUCOSE 127 (H) 12/29/2012 1151   BUN 19 06/28/2020 1105   BUN 17.9 05/14/2017 1145   CREATININE 1.32 (H) 06/28/2020 1105   CREATININE 1.3 05/14/2017 1145   CALCIUM 8.7 (L) 06/28/2020 1105   CALCIUM 9.3 05/14/2017 1145   PROT 6.6 06/28/2020 1105   PROT 6.2 05/14/2017 1145   PROT 6.6 05/14/2017 1145   ALBUMIN 3.1 (L) 06/28/2020 1105   ALBUMIN 3.3 (L) 05/14/2017 1145   AST 22 06/28/2020 1105   AST 19 05/14/2017 1145   ALT  14 06/28/2020 1105   ALT 18 05/14/2017 1145   ALKPHOS 49 06/28/2020 1105   ALKPHOS 45 05/14/2017 1145   BILITOT 0.7 06/28/2020 1105   BILITOT 0.67 05/14/2017 1145   GFRNONAA 57 (L) 06/28/2020 1105   GFRNONAA >60 09/30/2013 0945   GFRAA 59 (L) 04/05/2020 1031   GFRAA >60 09/30/2013 0945    No results found for: SPEP, UPEP  Lab Results  Component Value Date   WBC 4.4 06/28/2020   NEUTROABS 2.5 06/28/2020   HGB 11.8 (L) 06/28/2020   HCT 34.4 (L) 06/28/2020   MCV 101.5 (H) 06/28/2020   PLT 116 (L) 06/28/2020      Chemistry      Component Value Date/Time   NA 141 06/28/2020 1105   NA 139 05/14/2017  1145   K 3.6 06/28/2020 1105   K 3.8 05/14/2017 1145   CL 108 06/28/2020 1105   CL 108 (H) 09/30/2013 0945   CL 110 (H) 12/29/2012 1151   CO2 26 06/28/2020 1105   CO2 27 05/14/2017 1145   BUN 19 06/28/2020 1105   BUN 17.9 05/14/2017 1145   CREATININE 1.32 (H) 06/28/2020 1105   CREATININE 1.3 05/14/2017 1145      Component Value Date/Time   CALCIUM 8.7 (L) 06/28/2020 1105   CALCIUM 9.3 05/14/2017 1145   ALKPHOS 49 06/28/2020 1105   ALKPHOS 45 05/14/2017 1145   AST 22 06/28/2020 1105   AST 19 05/14/2017 1145   ALT 14 06/28/2020 1105   ALT 18 05/14/2017 1145   BILITOT 0.7 06/28/2020 1105   BILITOT 0.67 05/14/2017 1145

## 2020-06-28 NOTE — Assessment & Plan Note (Signed)
This is likely due to recent treatment. The patient denies recent history of bleeding such as epistaxis, hematuria or hematochezia. He is asymptomatic from the low platelet count. I will observe for now.   We will continue low-dose Revlimid as prescribed.

## 2020-07-01 ENCOUNTER — Other Ambulatory Visit: Payer: Self-pay

## 2020-07-01 LAB — MULTIPLE MYELOMA PANEL, SERUM
Albumin SerPl Elph-Mcnc: 3.3 g/dL (ref 2.9–4.4)
Albumin/Glob SerPl: 1.2 (ref 0.7–1.7)
Alpha 1: 0.2 g/dL (ref 0.0–0.4)
Alpha2 Glob SerPl Elph-Mcnc: 0.6 g/dL (ref 0.4–1.0)
B-Globulin SerPl Elph-Mcnc: 0.8 g/dL (ref 0.7–1.3)
Gamma Glob SerPl Elph-Mcnc: 1.4 g/dL (ref 0.4–1.8)
Globulin, Total: 3 g/dL (ref 2.2–3.9)
IgA: 321 mg/dL (ref 61–437)
IgG (Immunoglobin G), Serum: 1239 mg/dL (ref 603–1613)
IgM (Immunoglobulin M), Srm: 26 mg/dL (ref 15–143)
Total Protein ELP: 6.3 g/dL (ref 6.0–8.5)

## 2020-07-01 LAB — KAPPA/LAMBDA LIGHT CHAINS
Kappa free light chain: 77.6 mg/L — ABNORMAL HIGH (ref 3.3–19.4)
Kappa, lambda light chain ratio: 1.48 (ref 0.26–1.65)
Lambda free light chains: 52.4 mg/L — ABNORMAL HIGH (ref 5.7–26.3)

## 2020-07-01 MED ORDER — LENALIDOMIDE 5 MG PO CAPS
ORAL_CAPSULE | ORAL | 0 refills | Status: DC
Start: 2020-07-01 — End: 2020-08-09

## 2020-07-02 ENCOUNTER — Telehealth: Payer: Self-pay

## 2020-07-02 NOTE — Telephone Encounter (Signed)
-----   Message from Heath Lark, MD sent at 07/02/2020  9:36 AM EST ----- Regarding: let him know he is still in remission

## 2020-07-02 NOTE — Telephone Encounter (Signed)
Called and given below message. He verbalized understanding. 

## 2020-08-07 ENCOUNTER — Other Ambulatory Visit: Payer: Self-pay

## 2020-08-07 ENCOUNTER — Ambulatory Visit (INDEPENDENT_AMBULATORY_CARE_PROVIDER_SITE_OTHER): Payer: Medicare Other | Admitting: Podiatry

## 2020-08-07 ENCOUNTER — Encounter: Payer: Self-pay | Admitting: Podiatry

## 2020-08-07 DIAGNOSIS — B351 Tinea unguium: Secondary | ICD-10-CM | POA: Diagnosis not present

## 2020-08-07 DIAGNOSIS — M79676 Pain in unspecified toe(s): Secondary | ICD-10-CM

## 2020-08-07 NOTE — Progress Notes (Signed)
[name] presents today with his wife for a chief complaint of painful elongated toenails.  He also states that his hallux left is still leaning over towards the third toe where he had his amputated second toe  He states that he is having no problems no open lesions or wounds.  Seems to be doing fine.  Says he has not been on his feet a lot since his wintertime and he is not outside and about.  Objective: Vital signs are stable he is alert and oriented x3.  There is no erythema edema cellulitis drainage or odor mild reactive hyperkeratotic lesion distal medial aspect of the third digit left foot secondary to the hallux leaning on it.  This is sort of a preulcerative lesion at this point.  Toenails are long thick yellow dystrophic with mycotic.   Assessment pain in limb secondary to onychomycosis.  Plan: Debrided toenails 1 through 5 bilaterally placed Silastic coverings over the appropriate toe to prevent breakdown.  Discussed these with him.  Follow-up with him in 3 months sooner if needed.

## 2020-08-09 ENCOUNTER — Other Ambulatory Visit: Payer: Self-pay

## 2020-08-09 MED ORDER — LENALIDOMIDE 5 MG PO CAPS
ORAL_CAPSULE | ORAL | 0 refills | Status: DC
Start: 2020-08-09 — End: 2020-09-06

## 2020-08-23 ENCOUNTER — Other Ambulatory Visit: Payer: Self-pay

## 2020-08-23 ENCOUNTER — Inpatient Hospital Stay: Payer: Medicare Other | Attending: Hematology and Oncology

## 2020-08-23 DIAGNOSIS — C9001 Multiple myeloma in remission: Secondary | ICD-10-CM

## 2020-08-23 DIAGNOSIS — Z95828 Presence of other vascular implants and grafts: Secondary | ICD-10-CM

## 2020-08-23 DIAGNOSIS — Z452 Encounter for adjustment and management of vascular access device: Secondary | ICD-10-CM | POA: Diagnosis present

## 2020-08-23 MED ORDER — HEPARIN SOD (PORK) LOCK FLUSH 100 UNIT/ML IV SOLN
500.0000 [IU] | Freq: Once | INTRAVENOUS | Status: AC | PRN
  Administered 2020-08-23: 500 [IU] via INTRAVENOUS
  Filled 2020-08-23: qty 5

## 2020-08-23 MED ORDER — SODIUM CHLORIDE 0.9% FLUSH
10.0000 mL | INTRAVENOUS | Status: DC | PRN
  Administered 2020-08-23: 10 mL
  Filled 2020-08-23: qty 10

## 2020-08-23 NOTE — Patient Instructions (Signed)

## 2020-09-06 ENCOUNTER — Other Ambulatory Visit: Payer: Self-pay

## 2020-09-06 MED ORDER — LENALIDOMIDE 5 MG PO CAPS
ORAL_CAPSULE | ORAL | 0 refills | Status: DC
Start: 2020-09-06 — End: 2020-10-04

## 2020-10-04 ENCOUNTER — Other Ambulatory Visit: Payer: Self-pay

## 2020-10-04 MED ORDER — LENALIDOMIDE 5 MG PO CAPS: ORAL_CAPSULE | ORAL | 0 refills | Status: DC

## 2020-10-22 HISTORY — PX: BASAL CELL CARCINOMA EXCISION: SHX1214

## 2020-10-28 ENCOUNTER — Encounter: Payer: Self-pay | Admitting: Hematology and Oncology

## 2020-10-28 ENCOUNTER — Telehealth: Payer: Self-pay | Admitting: Hematology and Oncology

## 2020-10-28 ENCOUNTER — Other Ambulatory Visit: Payer: Self-pay

## 2020-10-28 ENCOUNTER — Inpatient Hospital Stay: Payer: Medicare Other

## 2020-10-28 ENCOUNTER — Inpatient Hospital Stay: Payer: Medicare Other | Attending: Hematology and Oncology

## 2020-10-28 ENCOUNTER — Inpatient Hospital Stay (HOSPITAL_BASED_OUTPATIENT_CLINIC_OR_DEPARTMENT_OTHER): Payer: Medicare Other | Admitting: Hematology and Oncology

## 2020-10-28 DIAGNOSIS — N183 Chronic kidney disease, stage 3 unspecified: Secondary | ICD-10-CM | POA: Diagnosis not present

## 2020-10-28 DIAGNOSIS — C9001 Multiple myeloma in remission: Secondary | ICD-10-CM | POA: Insufficient documentation

## 2020-10-28 DIAGNOSIS — C61 Malignant neoplasm of prostate: Secondary | ICD-10-CM | POA: Diagnosis not present

## 2020-10-28 DIAGNOSIS — D61818 Other pancytopenia: Secondary | ICD-10-CM

## 2020-10-28 DIAGNOSIS — Z95828 Presence of other vascular implants and grafts: Secondary | ICD-10-CM

## 2020-10-28 DIAGNOSIS — C9000 Multiple myeloma not having achieved remission: Secondary | ICD-10-CM

## 2020-10-28 DIAGNOSIS — Z85828 Personal history of other malignant neoplasm of skin: Secondary | ICD-10-CM | POA: Diagnosis not present

## 2020-10-28 LAB — COMPREHENSIVE METABOLIC PANEL
ALT: 13 U/L (ref 0–44)
AST: 18 U/L (ref 15–41)
Albumin: 3.2 g/dL — ABNORMAL LOW (ref 3.5–5.0)
Alkaline Phosphatase: 60 U/L (ref 38–126)
Anion gap: 10 (ref 5–15)
BUN: 17 mg/dL (ref 8–23)
CO2: 25 mmol/L (ref 22–32)
Calcium: 8.2 mg/dL — ABNORMAL LOW (ref 8.9–10.3)
Chloride: 105 mmol/L (ref 98–111)
Creatinine, Ser: 1.34 mg/dL — ABNORMAL HIGH (ref 0.61–1.24)
GFR, Estimated: 55 mL/min — ABNORMAL LOW (ref >60)
Glucose, Bld: 116 mg/dL — ABNORMAL HIGH (ref 70–99)
Potassium: 3.6 mmol/L (ref 3.5–5.1)
Sodium: 140 mmol/L (ref 135–145)
Total Bilirubin: 1 mg/dL (ref 0.3–1.2)
Total Protein: 6.4 g/dL — ABNORMAL LOW (ref 6.5–8.1)

## 2020-10-28 LAB — CBC WITH DIFFERENTIAL/PLATELET
Abs Immature Granulocytes: 0.01 10*3/uL (ref 0.00–0.07)
Basophils Absolute: 0.1 10*3/uL (ref 0.0–0.1)
Basophils Relative: 1 %
Eosinophils Absolute: 0.6 10*3/uL — ABNORMAL HIGH (ref 0.0–0.5)
Eosinophils Relative: 16 %
HCT: 32.2 % — ABNORMAL LOW (ref 39.0–52.0)
Hemoglobin: 11.3 g/dL — ABNORMAL LOW (ref 13.0–17.0)
Immature Granulocytes: 0 %
Lymphocytes Relative: 31 %
Lymphs Abs: 1.3 10*3/uL (ref 0.7–4.0)
MCH: 35.2 pg — ABNORMAL HIGH (ref 26.0–34.0)
MCHC: 35.1 g/dL (ref 30.0–36.0)
MCV: 100.3 fL — ABNORMAL HIGH (ref 80.0–100.0)
Monocytes Absolute: 0.7 10*3/uL (ref 0.1–1.0)
Monocytes Relative: 17 %
Neutro Abs: 1.5 10*3/uL — ABNORMAL LOW (ref 1.7–7.7)
Neutrophils Relative %: 35 %
Platelets: 112 10*3/uL — ABNORMAL LOW (ref 150–400)
RBC: 3.21 MIL/uL — ABNORMAL LOW (ref 4.22–5.81)
RDW: 14.2 % (ref 11.5–15.5)
WBC: 4.1 10*3/uL (ref 4.0–10.5)
nRBC: 0 % (ref 0.0–0.2)

## 2020-10-28 MED ORDER — ZOLEDRONIC ACID 4 MG/5ML IV CONC
3.5000 mg | Freq: Once | INTRAVENOUS | Status: AC
  Administered 2020-10-28: 3.5 mg via INTRAVENOUS
  Filled 2020-10-28: qty 4.38

## 2020-10-28 MED ORDER — SODIUM CHLORIDE 0.9% FLUSH
10.0000 mL | INTRAVENOUS | Status: DC | PRN
  Administered 2020-10-28: 10 mL
  Filled 2020-10-28: qty 10

## 2020-10-28 MED ORDER — ZOLEDRONIC ACID 4 MG/100ML IV SOLN
INTRAVENOUS | Status: AC
  Filled 2020-10-28: qty 100

## 2020-10-28 NOTE — Assessment & Plan Note (Signed)
He has mild stable CKD I emphasized the importance of adequate fluid hydration

## 2020-10-28 NOTE — Progress Notes (Signed)
Okay to treat today with Calcium of 8.2 per Dr. Alvy Bimler. Patient educated to increase calcium intake.

## 2020-10-28 NOTE — Patient Instructions (Signed)
Zoledronic Acid Injection (Hypercalcemia, Oncology) What is this medicine? ZOLEDRONIC ACID (ZOE le dron ik AS id) slows calcium loss from bones. It high calcium levels in the blood from some kinds of cancer. It may be used in other people at risk for bone loss. This medicine may be used for other purposes; ask your health care provider or pharmacist if you have questions. COMMON BRAND NAME(S): Zometa What should I tell my health care provider before I take this medicine? They need to know if you have any of these conditions:  cancer  dehydration  dental disease  kidney disease  liver disease  low levels of calcium in the blood  lung or breathing disease (asthma)  receiving steroids like dexamethasone or prednisone  an unusual or allergic reaction to zoledronic acid, other medicines, foods, dyes, or preservatives  pregnant or trying to get pregnant  breast-feeding How should I use this medicine? This drug is injected into a vein. It is given by a health care provider in a hospital or clinic setting. Talk to your health care provider about the use of this drug in children. Special care may be needed. Overdosage: If you think you have taken too much of this medicine contact a poison control center or emergency room at once. NOTE: This medicine is only for you. Do not share this medicine with others. What if I miss a dose? Keep appointments for follow-up doses. It is important not to miss your dose. Call your health care provider if you are unable to keep an appointment. What may interact with this medicine?  certain antibiotics given by injection  NSAIDs, medicines for pain and inflammation, like ibuprofen or naproxen  some diuretics like bumetanide, furosemide  teriparatide  thalidomide This list may not describe all possible interactions. Give your health care provider a list of all the medicines, herbs, non-prescription drugs, or dietary supplements you use. Also tell  them if you smoke, drink alcohol, or use illegal drugs. Some items may interact with your medicine. What should I watch for while using this medicine? Visit your health care provider for regular checks on your progress. It may be some time before you see the benefit from this drug. Some people who take this drug have severe bone, joint, or muscle pain. This drug may also increase your risk for jaw problems or a broken thigh bone. Tell your health care provider right away if you have severe pain in your jaw, bones, joints, or muscles. Tell you health care provider if you have any pain that does not go away or that gets worse. Tell your dentist and dental surgeon that you are taking this drug. You should not have major dental surgery while on this drug. See your dentist to have a dental exam and fix any dental problems before starting this drug. Take good care of your teeth while on this drug. Make sure you see your dentist for regular follow-up appointments. You should make sure you get enough calcium and vitamin D while you are taking this drug. Discuss the foods you eat and the vitamins you take with your health care provider. Check with your health care provider if you have severe diarrhea, nausea, and vomiting, or if you sweat a lot. The loss of too much body fluid may make it dangerous for you to take this drug. You may need blood work done while you are taking this drug. Do not become pregnant while taking this drug. Women should inform their health care provider  if they wish to become pregnant or think they might be pregnant. There is potential for serious harm to an unborn child. Talk to your health care provider for more information. What side effects may I notice from receiving this medicine? Side effects that you should report to your doctor or health care provider as soon as possible:  allergic reactions (skin rash, itching or hives; swelling of the face, lips, or tongue)  bone  pain  infection (fever, chills, cough, sore throat, pain or trouble passing urine)  jaw pain, especially after dental work  joint pain  kidney injury (trouble passing urine or change in the amount of urine)  low blood pressure (dizziness; feeling faint or lightheaded, falls; unusually weak or tired)  low calcium levels (fast heartbeat; muscle cramps or pain; pain, tingling, or numbness in the hands or feet; seizures)  low magnesium levels (fast, irregular heartbeat; muscle cramp or pain; muscle weakness; tremors; seizures)  low red blood cell counts (trouble breathing; feeling faint; lightheaded, falls; unusually weak or tired)  muscle pain  redness, blistering, peeling, or loosening of the skin, including inside the mouth  severe diarrhea  swelling of the ankles, feet, hands  trouble breathing Side effects that usually do not require medical attention (report to your doctor or health care provider if they continue or are bothersome):  anxious  constipation  coughing  depressed mood  eye irritation, itching, or pain  fever  general ill feeling or flu-like symptoms  nausea  pain, redness, or irritation at site where injected  trouble sleeping This list may not describe all possible side effects. Call your doctor for medical advice about side effects. You may report side effects to FDA at 1-800-FDA-1088. Where should I keep my medicine? This drug is given in a hospital or clinic. It will not be stored at home. NOTE: This sheet is a summary. It may not cover all possible information. If you have questions about this medicine, talk to your doctor, pharmacist, or health care provider.  2021 Elsevier/Gold Standard (2019-04-27 09:13:00)

## 2020-10-28 NOTE — Progress Notes (Signed)
[facility] OFFICE PROGRESS NOTE  Patient Care Team: Jinny Sanders, MD as PCP - General Jeanann Lewandowsky, MD as Consulting Physician (Hematology and Oncology) Cathlean Marseilles, NP as Nurse Practitioner (Hematology and Oncology) Raynelle Bring, MD as Consulting Physician (Urology) Heath Lark, MD as Consulting Physician (Hematology and Oncology) [place], Romilda Garret, [place] as Consulting Physician (Podiatry) Morton Stall, Howell Rucks, NP as Nurse Practitioner (Pain Medicine) Druscilla Brownie, MD as Consulting Physician (Dermatology) Bryson Ha, OD as Consulting Physician (Optometry)  ASSESSMENT & PLAN:  Multiple myeloma in remission [facility]) He is currently on low-dose Revlimid as a form of maintenance treatment In the meantime, he will continue aspirin, calcium with vitamin D He denies recent dental issues. The patient is noted to have been on Zometa for more than 7 years He is currently on treatment with Zometa every 4 months However, if his hypocalcemia gets worse, we may have to stop in the future  Malignant neoplasm of prostate [facility]) He is undergoing androgen deprivation treatment I will defer to urologist for management  History of skin cancer He has recent Mohs procedure for basal cell cancer He will continue aggressive management by dermatologist  Chronic kidney disease, stage III (moderate) He has mild stable CKD I emphasized the importance of adequate fluid hydration   Hypocalcemia This could be exacerbated by Zometa treatment I plan to check vitamin D level in his next visit The patient is advised to continue on vitamin D supplement and to increase calcium and  Acquired pancytopenia ([place]) This is likely due to recent treatment. The patient denies recent history of bleeding such as epistaxis, hematuria or hematochezia. He is asymptomatic from the low platelet count. I will observe for now.   We will continue low-dose Revlimid as prescribed.   Orders Placed  This Encounter  Procedures  . VITAMIN D 25 Hydroxy (Vit-D Deficiency, Fractures)    Standing Status:   Standing    Number of Occurrences:   1    Standing Expiration Date:   10/28/2021    All questions were answered. The patient knows to call the clinic with any problems, questions or concerns. The total time spent in the appointment was 30 minutes encounter with patients including review of chart and various tests results, discussions about plan of care and coordination of care plan   Heath Lark, MD 10/28/2020 2:01 PM  INTERVAL HISTORY: Please see below for problem oriented charting. He returns for further follow-up He had recent Mohs procedure performed due to skin cancer/basal cell carcinoma of the left temple He is tolerating androgen deprivation therapy well Denies new bone pain No recent infection The patient denies any recent signs or symptoms of bleeding such as spontaneous epistaxis, hematuria or hematochezia  SUMMARY OF ONCOLOGIC HISTORY: Oncology History  Malignant neoplasm of prostate ([place])  02/23/2007 Initial Diagnosis   Malignant neoplasm of prostate ([place])   10/28/2020 Cancer Staging   Staging form: Prostate, AJCC 6th Edition - Clinical: Stage III (T3, N0, M0) - Signed by Heath Lark, MD on 10/28/2020     REVIEW OF SYSTEMS:   Constitutional: Denies fevers, chills or abnormal weight loss Eyes: Denies blurriness of vision Ears, nose, mouth, throat, and face: Denies mucositis or sore throat Respiratory: Denies cough, dyspnea or wheezes Cardiovascular: Denies palpitation, chest discomfort or lower extremity swelling Gastrointestinal:  Denies nausea, heartburn or change in bowel habits Lymphatics: Denies new lymphadenopathy or easy bruising Neurological:Denies numbness, tingling or new weaknesses Behavioral/Psych: Mood is stable, no new changes  All other systems were reviewed with the patient and are negative.  I have reviewed the past medical history, past surgical  history, social history and family history with the patient and they are unchanged from previous note.  ALLERGIES:  is allergic to celebrex [celecoxib], cymbalta [duloxetine hcl], hydromorphone hcl, meloxicam, oxycontin [oxycodone], percocet [oxycodone-acetaminophen], vicodin [hydrocodone-acetaminophen], and lyrica [pregabalin].  MEDICATIONS:  Current Outpatient Medications  Medication Sig Dispense Refill  . aspirin 81 MG tablet Take 81 mg by mouth daily.    . B Complex Vitamins (VITAMIN-B COMPLEX) TABS Take by mouth.    . Calcium Carbonate-Vitamin D 600-400 MG-UNIT per tablet Take by mouth.    . cholecalciferol (VITAMIN D) 1000 UNITS tablet Take 1,000 Units by mouth daily.    . fentaNYL (DURAGESIC) 75 MCG/HR APP 1 PA EXT TO THE SKIN QOD    . lenalidomide (REVLIMID) 5 MG capsule Take 1 capsule by mouth every day for 21 days then 7 days off. 21 capsule 0  . lidocaine-prilocaine (EMLA) cream Apply topically as needed. Apply to port site one hour before treatment and cover with plastic wrap 30 g 2  . NONFORMULARY OR COMPOUNDED ITEM Baclofn/amitrp/ketamn cream topically to feet TID prn for neuropathy    . oxybutynin (DITROPAN) 5 MG tablet Take 5 mg by mouth 2 (two) times daily.    Marland Kitchen oxyCODONE (OXY IR/ROXICODONE) 5 MG immediate release tablet TK 1 T PO Q 4 H PRN P  0  . pantoprazole (PROTONIX) 40 MG tablet TAKE 1 TABLET BY MOUTH EVERY DAY 90 tablet 3   No current facility-administered medications for this visit.    PHYSICAL EXAMINATION: ECOG PERFORMANCE STATUS: 1 - Symptomatic but completely ambulatory  Vitals:   10/28/20 1205  BP: 124/61  Pulse: 66  Resp: 18  Temp: 98 F (36.7 C)  SpO2: 100%   Filed Weights   10/28/20 1205  Weight: 184 lb (83.5 kg)    GENERAL:alert, no distress and comfortable SKIN: skin color, texture, turgor are normal, no rashes or significant lesions EYES: normal, Conjunctiva are pink and non-injected, sclera clear OROPHARYNX:no exudate, no erythema and  lips, buccal mucosa, and tongue normal  NECK: supple, thyroid normal size, non-tender, without nodularity LYMPH:  no palpable lymphadenopathy in the cervical, axillary or inguinal LUNGS: clear to auscultation and percussion with normal breathing effort HEART: regular rate & rhythm and no murmurs and no lower extremity edema ABDOMEN:abdomen soft, non-tender and normal bowel sounds Musculoskeletal:no cyanosis of digits and no clubbing  NEURO: alert & oriented x 3 with fluent speech, no focal motor/sensory deficits  LABORATORY DATA:  I have reviewed the data as listed    Component Value Date/Time   NA 140 10/28/2020 1149   NA 139 05/14/2017 1145   K 3.6 10/28/2020 1149   K 3.8 05/14/2017 1145   CL 105 10/28/2020 1149   CL 108 (H) 09/30/2013 0945   CL 110 (H) 12/29/2012 1151   CO2 25 10/28/2020 1149   CO2 27 05/14/2017 1145   GLUCOSE 116 (H) 10/28/2020 1149   GLUCOSE 109 05/14/2017 1145   GLUCOSE 127 (H) 12/29/2012 1151   BUN 17 10/28/2020 1149   BUN 17.9 05/14/2017 1145   CREATININE 1.34 (H) 10/28/2020 1149   CREATININE 1.3 05/14/2017 1145   CALCIUM 8.2 (L) 10/28/2020 1149   CALCIUM 9.3 05/14/2017 1145   PROT 6.4 (L) 10/28/2020 1149   PROT 6.2 05/14/2017 1145   PROT 6.6 05/14/2017 1145   ALBUMIN 3.2 (L) 10/28/2020 1149   ALBUMIN  3.3 (L) 05/14/2017 1145   AST 18 10/28/2020 1149   AST 19 05/14/2017 1145   ALT 13 10/28/2020 1149   ALT 18 05/14/2017 1145   ALKPHOS 60 10/28/2020 1149   ALKPHOS 45 05/14/2017 1145   BILITOT 1.0 10/28/2020 1149   BILITOT 0.67 05/14/2017 1145   GFRNONAA 55 (L) 10/28/2020 1149   GFRNONAA >60 09/30/2013 0945   GFRAA 59 (L) 04/05/2020 1031   GFRAA >60 09/30/2013 0945    No results found for: SPEP, UPEP  Lab Results  Component Value Date   WBC 4.1 10/28/2020   NEUTROABS 1.5 (L) 10/28/2020   HGB 11.3 (L) 10/28/2020   HCT 32.2 (L) 10/28/2020   MCV 100.3 (H) 10/28/2020   PLT 112 (L) 10/28/2020      Chemistry      Component Value Date/Time    NA 140 10/28/2020 1149   NA 139 05/14/2017 1145   K 3.6 10/28/2020 1149   K 3.8 05/14/2017 1145   CL 105 10/28/2020 1149   CL 108 (H) 09/30/2013 0945   CL 110 (H) 12/29/2012 1151   CO2 25 10/28/2020 1149   CO2 27 05/14/2017 1145   BUN 17 10/28/2020 1149   BUN 17.9 05/14/2017 1145   CREATININE 1.34 (H) 10/28/2020 1149   CREATININE 1.3 05/14/2017 1145      Component Value Date/Time   CALCIUM 8.2 (L) 10/28/2020 1149   CALCIUM 9.3 05/14/2017 1145   ALKPHOS 60 10/28/2020 1149   ALKPHOS 45 05/14/2017 1145   AST 18 10/28/2020 1149   AST 19 05/14/2017 1145   ALT 13 10/28/2020 1149   ALT 18 05/14/2017 1145   BILITOT 1.0 10/28/2020 1149   BILITOT 0.67 05/14/2017 1145

## 2020-10-28 NOTE — Telephone Encounter (Signed)
Scheduled appts per 4/4 sch msg. Pt aware.  

## 2020-10-28 NOTE — Assessment & Plan Note (Signed)
He is currently on low-dose Revlimid as a form of maintenance treatment In the meantime, he will continue aspirin, calcium with vitamin D He denies recent dental issues. The patient is noted to have been on Zometa for more than 7 years He is currently on treatment with Zometa every 4 months However, if his hypocalcemia gets worse, we may have to stop in the future

## 2020-10-28 NOTE — Assessment & Plan Note (Signed)
This is likely due to recent treatment. The patient denies recent history of bleeding such as epistaxis, hematuria or hematochezia. He is asymptomatic from the low platelet count. I will observe for now.   We will continue low-dose Revlimid as prescribed.

## 2020-10-28 NOTE — Assessment & Plan Note (Signed)
This could be exacerbated by Zometa treatment I plan to check vitamin D level in his next visit The patient is advised to continue on vitamin D supplement and to increase calcium and

## 2020-10-28 NOTE — Assessment & Plan Note (Signed)
He is undergoing androgen deprivation treatment I will defer to urologist for management

## 2020-10-28 NOTE — Assessment & Plan Note (Signed)
He has recent Mohs procedure for basal cell cancer He will continue aggressive management by dermatologist

## 2020-10-28 NOTE — Patient Instructions (Signed)

## 2020-10-29 ENCOUNTER — Telehealth: Payer: Self-pay

## 2020-10-29 LAB — KAPPA/LAMBDA LIGHT CHAINS
Kappa free light chain: 85.8 mg/L — ABNORMAL HIGH (ref 3.3–19.4)
Kappa, lambda light chain ratio: 1.59 (ref 0.26–1.65)
Lambda free light chains: 53.8 mg/L — ABNORMAL HIGH (ref 5.7–26.3)

## 2020-10-29 LAB — MULTIPLE MYELOMA PANEL, SERUM
Albumin SerPl Elph-Mcnc: 3.2 g/dL (ref 2.9–4.4)
Albumin/Glob SerPl: 1.1 (ref 0.7–1.7)
Alpha 1: 0.2 g/dL (ref 0.0–0.4)
Alpha2 Glob SerPl Elph-Mcnc: 0.6 g/dL (ref 0.4–1.0)
B-Globulin SerPl Elph-Mcnc: 0.8 g/dL (ref 0.7–1.3)
Gamma Glob SerPl Elph-Mcnc: 1.3 g/dL (ref 0.4–1.8)
Globulin, Total: 3 g/dL (ref 2.2–3.9)
IgA: 293 mg/dL (ref 61–437)
IgG (Immunoglobin G), Serum: 1338 mg/dL (ref 603–1613)
IgM (Immunoglobulin M), Srm: 28 mg/dL (ref 15–143)
Total Protein ELP: 6.2 g/dL (ref 6.0–8.5)

## 2020-10-29 NOTE — Telephone Encounter (Signed)
Patient notified of lab results.  No further needs at this time.

## 2020-10-29 NOTE — Telephone Encounter (Signed)
-----   Message from Heath Lark, MD sent at 10/29/2020  2:46 PM EDT ----- Pls call and let him know myeloma in remission

## 2020-11-01 ENCOUNTER — Other Ambulatory Visit: Payer: Self-pay

## 2020-11-01 MED ORDER — LENALIDOMIDE 5 MG PO CAPS: ORAL_CAPSULE | ORAL | 0 refills | Status: DC

## 2020-11-06 ENCOUNTER — Other Ambulatory Visit: Payer: Self-pay

## 2020-11-06 ENCOUNTER — Ambulatory Visit (INDEPENDENT_AMBULATORY_CARE_PROVIDER_SITE_OTHER): Payer: Medicare Other | Admitting: Podiatry

## 2020-11-06 ENCOUNTER — Encounter: Payer: Self-pay | Admitting: Podiatry

## 2020-11-06 DIAGNOSIS — D2372 Other benign neoplasm of skin of left lower limb, including hip: Secondary | ICD-10-CM

## 2020-11-06 DIAGNOSIS — B351 Tinea unguium: Secondary | ICD-10-CM | POA: Diagnosis not present

## 2020-11-06 DIAGNOSIS — M79676 Pain in unspecified toe(s): Secondary | ICD-10-CM | POA: Diagnosis not present

## 2020-11-06 DIAGNOSIS — D2371 Other benign neoplasm of skin of right lower limb, including hip: Secondary | ICD-10-CM | POA: Diagnosis not present

## 2020-12-03 ENCOUNTER — Other Ambulatory Visit: Payer: Self-pay

## 2020-12-03 MED ORDER — LENALIDOMIDE 5 MG PO CAPS: ORAL_CAPSULE | ORAL | 0 refills | Status: DC

## 2020-12-24 ENCOUNTER — Other Ambulatory Visit: Payer: Self-pay

## 2020-12-24 ENCOUNTER — Inpatient Hospital Stay: Payer: Medicare Other | Attending: Hematology and Oncology

## 2020-12-24 DIAGNOSIS — Z95828 Presence of other vascular implants and grafts: Secondary | ICD-10-CM

## 2020-12-24 DIAGNOSIS — C9001 Multiple myeloma in remission: Secondary | ICD-10-CM | POA: Diagnosis not present

## 2020-12-24 MED ORDER — SODIUM CHLORIDE 0.9% FLUSH
10.0000 mL | INTRAVENOUS | Status: DC | PRN
  Administered 2020-12-24: 10 mL
  Filled 2020-12-24: qty 10

## 2020-12-24 MED ORDER — HEPARIN SOD (PORK) LOCK FLUSH 100 UNIT/ML IV SOLN
500.0000 [IU] | Freq: Once | INTRAVENOUS | Status: AC | PRN
  Administered 2020-12-24: 500 [IU] via INTRAVENOUS
  Filled 2020-12-24: qty 5

## 2020-12-24 NOTE — Patient Instructions (Signed)

## 2020-12-25 ENCOUNTER — Other Ambulatory Visit: Payer: Self-pay

## 2020-12-25 MED ORDER — LENALIDOMIDE 5 MG PO CAPS: ORAL_CAPSULE | ORAL | 0 refills | Status: DC

## 2021-01-08 ENCOUNTER — Other Ambulatory Visit: Payer: Self-pay

## 2021-01-08 ENCOUNTER — Ambulatory Visit (INDEPENDENT_AMBULATORY_CARE_PROVIDER_SITE_OTHER): Payer: Medicare Other | Admitting: Podiatry

## 2021-01-08 ENCOUNTER — Encounter: Payer: Self-pay | Admitting: Podiatry

## 2021-01-08 VITALS — BP 127/69 | HR 66 | Temp 98.2°F | Resp 16

## 2021-01-08 DIAGNOSIS — L97521 Non-pressure chronic ulcer of other part of left foot limited to breakdown of skin: Secondary | ICD-10-CM | POA: Diagnosis not present

## 2021-01-08 MED ORDER — MUPIROCIN 2 % EX OINT: 1.0000 "application " | TOPICAL_OINTMENT | Freq: Two times a day (BID) | CUTANEOUS | 0 refills | Status: DC

## 2021-01-08 MED ORDER — DOXYCYCLINE HYCLATE 100 MG PO TABS: 100.0000 mg | ORAL_TABLET | Freq: Two times a day (BID) | ORAL | 0 refills | Status: DC

## 2021-01-08 NOTE — Progress Notes (Signed)
He presents today thinks that he has an infection to the third toe on the left foot secondary to the hallux rubbing it.  Amputated second toe is no longer in the way and the hallux is starting to lay laterally irritating the third toe.  States that its been red and swollen he has been using the mupirocin on it.  Objective: Vital signs are stable he is alert oriented x3 pulses are palpable.  Irritation to the distal medial aspect of the second toe left foot secondary to hallux valgus deformity.  Does not probe to bone but I cleaned up the ulcer today which measures about 3 mm in diameter.  No purulence no malodor but the toe is erythematous.  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  Plan: Cleaned up the ulceration today showed him and his wife how to pad this on a daily basis Epson salt warm water soaks doxycycline Musa Pearson and I will follow-up with him in a couple of weeks.  We also you discussed the use of the Darco shoe and we did discuss the need for amputation of the hallux should this not be healing.  He understands that and is amendable to it.

## 2021-01-16 ENCOUNTER — Other Ambulatory Visit: Payer: Self-pay

## 2021-01-16 MED ORDER — LENALIDOMIDE 5 MG PO CAPS: ORAL_CAPSULE | ORAL | 0 refills | Status: DC

## 2021-01-22 ENCOUNTER — Encounter: Payer: Self-pay | Admitting: Podiatry

## 2021-01-22 ENCOUNTER — Ambulatory Visit (INDEPENDENT_AMBULATORY_CARE_PROVIDER_SITE_OTHER): Payer: Medicare Other | Admitting: Podiatry

## 2021-01-22 ENCOUNTER — Other Ambulatory Visit: Payer: Self-pay

## 2021-01-22 DIAGNOSIS — L97521 Non-pressure chronic ulcer of other part of left foot limited to breakdown of skin: Secondary | ICD-10-CM | POA: Diagnosis not present

## 2021-01-22 NOTE — Progress Notes (Signed)
He presents today with his wife for follow-up of his ulceration second digit left foot.  He states I think is doing pretty good.  It is looking better.  Objective: Vital signs are stable alert and oriented x3.  Severe hallux valgus with a history of amputation of the second toe is resulting in a superficial ulceration to the medial aspect of the third toe left foot.  This appears to be healing very nicely there is still some mild erythema no cellulitis drainage or odor reactive hyperkeratotic tissue present today debrided.  Assessment healing ulceration third digit left foot.  Plan: Debrided ulceration today dressed ulceration today we will follow-up with him for nails in 1 month at which time we will discuss amputation of the hallux.

## 2021-01-27 ENCOUNTER — Other Ambulatory Visit: Payer: Self-pay | Admitting: Hematology and Oncology

## 2021-01-28 ENCOUNTER — Encounter: Payer: Self-pay | Admitting: Hematology and Oncology

## 2021-02-13 ENCOUNTER — Other Ambulatory Visit: Payer: Self-pay

## 2021-02-13 MED ORDER — LENALIDOMIDE 5 MG PO CAPS: ORAL_CAPSULE | ORAL | 0 refills | Status: DC

## 2021-02-17 ENCOUNTER — Other Ambulatory Visit: Payer: Self-pay | Admitting: Hematology and Oncology

## 2021-02-17 DIAGNOSIS — C9001 Multiple myeloma in remission: Secondary | ICD-10-CM

## 2021-02-18 ENCOUNTER — Inpatient Hospital Stay (HOSPITAL_BASED_OUTPATIENT_CLINIC_OR_DEPARTMENT_OTHER): Payer: Medicare Other | Admitting: Hematology and Oncology

## 2021-02-18 ENCOUNTER — Encounter: Payer: Self-pay | Admitting: Hematology and Oncology

## 2021-02-18 ENCOUNTER — Inpatient Hospital Stay: Payer: Medicare Other

## 2021-02-18 ENCOUNTER — Inpatient Hospital Stay: Payer: Medicare Other | Attending: Hematology and Oncology

## 2021-02-18 ENCOUNTER — Other Ambulatory Visit: Payer: Medicare Other

## 2021-02-18 ENCOUNTER — Other Ambulatory Visit: Payer: Self-pay

## 2021-02-18 DIAGNOSIS — C9001 Multiple myeloma in remission: Secondary | ICD-10-CM | POA: Diagnosis present

## 2021-02-18 DIAGNOSIS — C61 Malignant neoplasm of prostate: Secondary | ICD-10-CM

## 2021-02-18 DIAGNOSIS — Z95828 Presence of other vascular implants and grafts: Secondary | ICD-10-CM

## 2021-02-18 DIAGNOSIS — D539 Nutritional anemia, unspecified: Secondary | ICD-10-CM | POA: Insufficient documentation

## 2021-02-18 LAB — CBC WITH DIFFERENTIAL/PLATELET
Abs Immature Granulocytes: 0 10*3/uL (ref 0.00–0.07)
Basophils Absolute: 0.1 10*3/uL (ref 0.0–0.1)
Basophils Relative: 2 %
Eosinophils Absolute: 0.8 10*3/uL — ABNORMAL HIGH (ref 0.0–0.5)
Eosinophils Relative: 19 %
HCT: 31 % — ABNORMAL LOW (ref 39.0–52.0)
Hemoglobin: 10.8 g/dL — ABNORMAL LOW (ref 13.0–17.0)
Immature Granulocytes: 0 %
Lymphocytes Relative: 25 %
Lymphs Abs: 1 10*3/uL (ref 0.7–4.0)
MCH: 36.5 pg — ABNORMAL HIGH (ref 26.0–34.0)
MCHC: 34.8 g/dL (ref 30.0–36.0)
MCV: 104.7 fL — ABNORMAL HIGH (ref 80.0–100.0)
Monocytes Absolute: 0.6 10*3/uL (ref 0.1–1.0)
Monocytes Relative: 14 %
Neutro Abs: 1.6 10*3/uL — ABNORMAL LOW (ref 1.7–7.7)
Neutrophils Relative %: 40 %
Platelets: 101 10*3/uL — ABNORMAL LOW (ref 150–400)
RBC: 2.96 MIL/uL — ABNORMAL LOW (ref 4.22–5.81)
RDW: 14.6 % (ref 11.5–15.5)
Smear Review: NORMAL
WBC: 4 10*3/uL (ref 4.0–10.5)
nRBC: 0 % (ref 0.0–0.2)

## 2021-02-18 LAB — COMPREHENSIVE METABOLIC PANEL
ALT: 10 U/L (ref 0–44)
AST: 18 U/L (ref 15–41)
Albumin: 3.1 g/dL — ABNORMAL LOW (ref 3.5–5.0)
Alkaline Phosphatase: 56 U/L (ref 38–126)
Anion gap: 7 (ref 5–15)
BUN: 20 mg/dL (ref 8–23)
CO2: 25 mmol/L (ref 22–32)
Calcium: 8.9 mg/dL (ref 8.9–10.3)
Chloride: 105 mmol/L (ref 98–111)
Creatinine, Ser: 1.34 mg/dL — ABNORMAL HIGH (ref 0.61–1.24)
GFR, Estimated: 55 mL/min — ABNORMAL LOW (ref >60)
Glucose, Bld: 108 mg/dL — ABNORMAL HIGH (ref 70–99)
Potassium: 3.9 mmol/L (ref 3.5–5.1)
Sodium: 137 mmol/L (ref 135–145)
Total Bilirubin: 0.9 mg/dL (ref 0.3–1.2)
Total Protein: 6.4 g/dL — ABNORMAL LOW (ref 6.5–8.1)

## 2021-02-18 LAB — VITAMIN D 25 HYDROXY (VIT D DEFICIENCY, FRACTURES): Vit D, 25-Hydroxy: 63.01 ng/mL (ref 30–100)

## 2021-02-18 MED ORDER — SODIUM CHLORIDE 0.9% FLUSH
10.0000 mL | INTRAVENOUS | Status: DC | PRN
  Administered 2021-02-18: 10 mL
  Filled 2021-02-18: qty 10

## 2021-02-18 MED ORDER — HEPARIN SOD (PORK) LOCK FLUSH 100 UNIT/ML IV SOLN
500.0000 [IU] | Freq: Once | INTRAVENOUS | Status: AC | PRN
  Administered 2021-02-18: 500 [IU] via INTRAVENOUS
  Filled 2021-02-18: qty 5

## 2021-02-18 MED ORDER — ZOLEDRONIC ACID 4 MG/5ML IV CONC
3.5000 mg | Freq: Once | INTRAVENOUS | Status: AC
  Administered 2021-02-18: 3.5 mg via INTRAVENOUS
  Filled 2021-02-18: qty 4.38

## 2021-02-18 MED ORDER — SODIUM CHLORIDE 0.9 % IV SOLN
INTRAVENOUS | Status: DC
  Filled 2021-02-18: qty 250

## 2021-02-18 NOTE — Patient Instructions (Signed)

## 2021-02-18 NOTE — Assessment & Plan Note (Signed)
I am concerned about his progressive pancytopenia due to recent weight loss We will continue current dose of Revlimid but if he developed worsening pancytopenia in the future, we might have to reduce the dose of Revlimid

## 2021-02-18 NOTE — Assessment & Plan Note (Signed)
He is undergoing androgen deprivation treatment I will defer to urologist for management

## 2021-02-18 NOTE — Progress Notes (Signed)
[place] OFFICE PROGRESS NOTE  Patient Care Team: Jinny Sanders, MD as PCP - General Jeanann Lewandowsky, MD as Consulting Physician (Hematology and Oncology) Cathlean Marseilles, NP as Nurse Practitioner (Hematology and Oncology) Raynelle Bring, MD as Consulting Physician (Urology) Heath Lark, MD as Consulting Physician (Hematology and Oncology) Garrel Ridgel, [place] as Consulting Physician (Podiatry) Morton Stall, Howell Rucks, NP as Nurse Practitioner (Pain Medicine) Druscilla Brownie, MD as Consulting Physician (Dermatology) Bryson Ha, OD as Consulting Physician (Optometry)  ASSESSMENT & PLAN:  Multiple myeloma in remission [facility]) I am concerned about his progressive pancytopenia due to recent weight loss We will continue current dose of Revlimid but if he developed worsening pancytopenia in the future, we might have to reduce the dose of Revlimid  Malignant neoplasm of prostate [facility]) He is undergoing androgen deprivation treatment I will defer to urologist for management  Deficiency anemia He has mild worsening pancytopenia likely due to recent treatment He is not symptomatic I plan to order iron studies and vitamin B12 in his future blood work  Orders Placed This Encounter  Procedures   Iron and TIBC    Standing Status:   Future    Standing Expiration Date:   02/18/2022   Vitamin B12    Standing Status:   Future    Standing Expiration Date:   02/18/2022   Ferritin    Standing Status:   Future    Standing Expiration Date:   02/18/2022    All questions were answered. The patient knows to call the clinic with any problems, questions or concerns. The total time spent in the appointment was 20 minutes encounter with patients including review of chart and various tests results, discussions about plan of care and coordination of care plan   Heath Lark, MD 02/18/2021 3:45 PM  INTERVAL HISTORY: Please see below for problem oriented charting. He returns for treatment  and follow-up He denies recent infection He is doing well from prostate cancer treatment standpoint except for recent weight loss His appetite is preserved No recent infection, fever or chills No new bone pain  SUMMARY OF ONCOLOGIC HISTORY: Oncology History  Malignant neoplasm of prostate ([place])  02/23/2007 Initial Diagnosis   Malignant neoplasm of prostate ([place])    10/28/2020 Cancer Staging   Staging form: Prostate, AJCC 6th Edition - Clinical: Stage III (T3, N0, M0) - Signed by Heath Lark, MD on 10/28/2020      REVIEW OF SYSTEMS:   Constitutional: Denies fevers, chills or abnormal weight loss Eyes: Denies blurriness of vision Ears, nose, mouth, throat, and face: Denies mucositis or sore throat Respiratory: Denies cough, dyspnea or wheezes Cardiovascular: Denies palpitation, chest discomfort or lower extremity swelling Gastrointestinal:  Denies nausea, heartburn or change in bowel habits Skin: Denies abnormal skin rashes Lymphatics: Denies new lymphadenopathy or easy bruising Neurological:Denies numbness, tingling or new weaknesses Behavioral/Psych: Mood is stable, no new changes  All other systems were reviewed with the patient and are negative.  I have reviewed the past medical history, past surgical history, social history and family history with the patient and they are unchanged from previous note.  ALLERGIES:  is allergic to celebrex [celecoxib], cymbalta [duloxetine hcl], hydromorphone hcl, meloxicam, oxycontin [oxycodone], percocet [oxycodone-acetaminophen], vicodin [hydrocodone-acetaminophen], and lyrica [pregabalin].  MEDICATIONS:  Current Outpatient Medications  Medication Sig Dispense Refill   aspirin 81 MG tablet Take 81 mg by mouth daily.     B Complex Vitamins (VITAMIN-B COMPLEX) TABS Take by mouth.     Calcium  Carbonate-Vitamin D 600-400 MG-UNIT per tablet Take by mouth.     cholecalciferol (VITAMIN D) 1000 UNITS tablet Take 1,000 Units by mouth daily.      doxycycline (VIBRA-TABS) 100 MG tablet Take 1 tablet (100 mg total) by mouth 2 (two) times daily. 20 tablet 0   fentaNYL (DURAGESIC) 75 MCG/HR APP 1 PA EXT TO THE SKIN QOD     lenalidomide (REVLIMID) 5 MG capsule Take 1 capsule by mouth every day for 21 days then 7 days off. 21 capsule 0   lidocaine-prilocaine (EMLA) cream Apply topically as needed. Apply to port site one hour before treatment and cover with plastic wrap 30 g 2   mupirocin ointment (BACTROBAN) 2 % Apply 1 application topically 2 (two) times daily. 22 g 0   NONFORMULARY OR COMPOUNDED ITEM Baclofn/amitrp/ketamn cream topically to feet TID prn for neuropathy     oxybutynin (DITROPAN) 5 MG tablet Take 5 mg by mouth 2 (two) times daily.     oxyCODONE (OXY IR/ROXICODONE) 5 MG immediate release tablet TK 1 T PO Q 4 H PRN P  0   pantoprazole (PROTONIX) 40 MG tablet TAKE 1 TABLET BY MOUTH EVERY DAY 90 tablet 3   No current facility-administered medications for this visit.   Facility-Administered Medications Ordered in Other Visits  Medication Dose Route Frequency Provider Last Rate Last Admin   0.9 %  sodium chloride infusion   Intravenous Continuous Alvy Bimler, Ileigh Mettler, MD   Stopped at 02/18/21 1246   sodium chloride flush (NS) 0.9 % injection 10 mL  10 mL Intracatheter PRN Heath Lark, MD   10 mL at 02/18/21 1247    PHYSICAL EXAMINATION: ECOG PERFORMANCE STATUS: 1 - Symptomatic but completely ambulatory  Vitals:   02/18/21 1132  BP: 120/68  Pulse: 65  Resp: 16  Temp: 98.1 F (36.7 C)  SpO2: 100%   Filed Weights   02/18/21 1132  Weight: 179 lb 3.2 oz (81.3 kg)    GENERAL:alert, no distress and comfortable SKIN: skin color, texture, turgor are normal, no rashes or significant lesions EYES: normal, Conjunctiva are pink and non-injected, sclera clear OROPHARYNX:no exudate, no erythema and lips, buccal mucosa, and tongue normal  NECK: supple, thyroid normal size, non-tender, without nodularity LYMPH:  no palpable lymphadenopathy  in the cervical, axillary or inguinal LUNGS: clear to auscultation and percussion with normal breathing effort HEART: regular rate & rhythm and no murmurs and no lower extremity edema ABDOMEN:abdomen soft, non-tender and normal bowel sounds Musculoskeletal:no cyanosis of digits and no clubbing  NEURO: alert & oriented x 3 with fluent speech, no focal motor/sensory deficits  LABORATORY DATA:  I have reviewed the data as listed    Component Value Date/Time   NA 137 02/18/2021 1121   NA 139 05/14/2017 1145   K 3.9 02/18/2021 1121   K 3.8 05/14/2017 1145   CL 105 02/18/2021 1121   CL 108 (H) 09/30/2013 0945   CL 110 (H) 12/29/2012 1151   CO2 25 02/18/2021 1121   CO2 27 05/14/2017 1145   GLUCOSE 108 (H) 02/18/2021 1121   GLUCOSE 109 05/14/2017 1145   GLUCOSE 127 (H) 12/29/2012 1151   BUN 20 02/18/2021 1121   BUN 17.9 05/14/2017 1145   CREATININE 1.34 (H) 02/18/2021 1121   CREATININE 1.3 05/14/2017 1145   CALCIUM 8.9 02/18/2021 1121   CALCIUM 9.3 05/14/2017 1145   PROT 6.4 (L) 02/18/2021 1121   PROT 6.2 05/14/2017 1145   PROT 6.6 05/14/2017 1145   ALBUMIN 3.1 (L)  02/18/2021 1121   ALBUMIN 3.3 (L) 05/14/2017 1145   AST 18 02/18/2021 1121   AST 19 05/14/2017 1145   ALT 10 02/18/2021 1121   ALT 18 05/14/2017 1145   ALKPHOS 56 02/18/2021 1121   ALKPHOS 45 05/14/2017 1145   BILITOT 0.9 02/18/2021 1121   BILITOT 0.67 05/14/2017 1145   GFRNONAA 55 (L) 02/18/2021 1121   GFRNONAA >60 09/30/2013 0945   GFRAA 59 (L) 04/05/2020 1031   GFRAA >60 09/30/2013 0945    No results found for: SPEP, UPEP  Lab Results  Component Value Date   WBC 4.0 02/18/2021   NEUTROABS 1.6 (L) 02/18/2021   HGB 10.8 (L) 02/18/2021   HCT 31.0 (L) 02/18/2021   MCV 104.7 (H) 02/18/2021   PLT 101 (L) 02/18/2021      Chemistry      Component Value Date/Time   NA 137 02/18/2021 1121   NA 139 05/14/2017 1145   K 3.9 02/18/2021 1121   K 3.8 05/14/2017 1145   CL 105 02/18/2021 1121   CL 108 (H)  09/30/2013 0945   CL 110 (H) 12/29/2012 1151   CO2 25 02/18/2021 1121   CO2 27 05/14/2017 1145   BUN 20 02/18/2021 1121   BUN 17.9 05/14/2017 1145   CREATININE 1.34 (H) 02/18/2021 1121   CREATININE 1.3 05/14/2017 1145      Component Value Date/Time   CALCIUM 8.9 02/18/2021 1121   CALCIUM 9.3 05/14/2017 1145   ALKPHOS 56 02/18/2021 1121   ALKPHOS 45 05/14/2017 1145   AST 18 02/18/2021 1121   AST 19 05/14/2017 1145   ALT 10 02/18/2021 1121   ALT 18 05/14/2017 1145   BILITOT 0.9 02/18/2021 1121   BILITOT 0.67 05/14/2017 1145

## 2021-02-18 NOTE — Assessment & Plan Note (Signed)
He has mild worsening pancytopenia likely due to recent treatment He is not symptomatic I plan to order iron studies and vitamin B12 in his future blood work

## 2021-02-19 ENCOUNTER — Encounter: Payer: Self-pay | Admitting: Podiatry

## 2021-02-19 ENCOUNTER — Ambulatory Visit (INDEPENDENT_AMBULATORY_CARE_PROVIDER_SITE_OTHER): Payer: Medicare Other | Admitting: Podiatry

## 2021-02-19 DIAGNOSIS — B351 Tinea unguium: Secondary | ICD-10-CM | POA: Diagnosis not present

## 2021-02-19 DIAGNOSIS — M79676 Pain in unspecified toe(s): Secondary | ICD-10-CM | POA: Diagnosis not present

## 2021-02-19 DIAGNOSIS — R0989 Other specified symptoms and signs involving the circulatory and respiratory systems: Secondary | ICD-10-CM

## 2021-02-19 DIAGNOSIS — L97521 Non-pressure chronic ulcer of other part of left foot limited to breakdown of skin: Secondary | ICD-10-CM

## 2021-02-19 DIAGNOSIS — D2372 Other benign neoplasm of skin of left lower limb, including hip: Secondary | ICD-10-CM | POA: Diagnosis not present

## 2021-02-19 DIAGNOSIS — D2371 Other benign neoplasm of skin of right lower limb, including hip: Secondary | ICD-10-CM | POA: Diagnosis not present

## 2021-02-19 LAB — KAPPA/LAMBDA LIGHT CHAINS
Kappa free light chain: 86.1 mg/L — ABNORMAL HIGH (ref 3.3–19.4)
Kappa, lambda light chain ratio: 1.82 — ABNORMAL HIGH (ref 0.26–1.65)
Lambda free light chains: 47.3 mg/L — ABNORMAL HIGH (ref 5.7–26.3)

## 2021-02-19 NOTE — Progress Notes (Signed)
He presents today for debridement of his toenails.  He wants to discuss the ulceration to the third toe as well as amputation of the hallux.  He states that he feels that he be better off without the hallux rubbing the third toe.  Objective: Vital signs are stable he is alert and oriented x3.  Pulses are diminished left greater than right.  Currently there is no erythema edema cellulitis drainage or odor.  Severe hallux abductovalgus deformity of the left foot consistent with the left hallux is in juxtaposition of the third toe to react and provide a reactive hyperkeratosis.  Assessment: Toenails are painful and elongated.  Hallux valgus painful third toe.  Peripheral vascular disease.  Plan: Requesting ABIs immediately.  Once we receive these we will have Dow back in for a surgical discussion regarding amputation of the hallux left.

## 2021-02-21 LAB — MULTIPLE MYELOMA PANEL, SERUM
Albumin SerPl Elph-Mcnc: 3.3 g/dL (ref 2.9–4.4)
Albumin/Glob SerPl: 1.3 (ref 0.7–1.7)
Alpha 1: 0.2 g/dL (ref 0.0–0.4)
Alpha2 Glob SerPl Elph-Mcnc: 0.6 g/dL (ref 0.4–1.0)
B-Globulin SerPl Elph-Mcnc: 0.7 g/dL (ref 0.7–1.3)
Gamma Glob SerPl Elph-Mcnc: 1.1 g/dL (ref 0.4–1.8)
Globulin, Total: 2.6 g/dL (ref 2.2–3.9)
IgA: 264 mg/dL (ref 61–437)
IgG (Immunoglobin G), Serum: 1267 mg/dL (ref 603–1613)
IgM (Immunoglobulin M), Srm: 14 mg/dL — ABNORMAL LOW (ref 15–143)
Total Protein ELP: 5.9 g/dL — ABNORMAL LOW (ref 6.0–8.5)

## 2021-02-24 ENCOUNTER — Telehealth: Payer: Self-pay

## 2021-02-24 NOTE — Telephone Encounter (Signed)
Called and given below message. He verbalized understanding. 

## 2021-02-24 NOTE — Telephone Encounter (Signed)
-----   Message from Heath Lark, MD sent at 02/24/2021  7:48 AM EDT ----- Pls let him know myeloma panel looks good, in remission

## 2021-03-03 ENCOUNTER — Other Ambulatory Visit: Payer: Self-pay

## 2021-03-03 ENCOUNTER — Ambulatory Visit (INDEPENDENT_AMBULATORY_CARE_PROVIDER_SITE_OTHER): Payer: Medicare Other

## 2021-03-03 DIAGNOSIS — L97521 Non-pressure chronic ulcer of other part of left foot limited to breakdown of skin: Secondary | ICD-10-CM

## 2021-03-03 DIAGNOSIS — R0989 Other specified symptoms and signs involving the circulatory and respiratory systems: Secondary | ICD-10-CM | POA: Diagnosis not present

## 2021-03-19 ENCOUNTER — Other Ambulatory Visit: Payer: Self-pay

## 2021-03-19 ENCOUNTER — Ambulatory Visit (INDEPENDENT_AMBULATORY_CARE_PROVIDER_SITE_OTHER): Payer: Medicare Other | Admitting: Podiatry

## 2021-03-19 ENCOUNTER — Encounter: Payer: Self-pay | Admitting: Podiatry

## 2021-03-19 DIAGNOSIS — L97521 Non-pressure chronic ulcer of other part of left foot limited to breakdown of skin: Secondary | ICD-10-CM | POA: Diagnosis not present

## 2021-03-19 MED ORDER — AMOXICILLIN-POT CLAVULANATE 875-125 MG PO TABS
1.0000 | ORAL_TABLET | Freq: Two times a day (BID) | ORAL | 0 refills | Status: DC
Start: 2021-03-19 — End: 2021-04-16

## 2021-03-19 NOTE — Progress Notes (Signed)
He presents today to sign his paperwork regarding his amputation of the hallux left.  His third toe is becoming more red more painful.  Objective: Vital signs are stable he is alert oriented x3 we are assured by the vascular department that his pulses are good enough for this amputation.  His ABIs were normal.  He does have a superficial ulceration to the medial aspect of the third toe left foot with cellulitis extending to the level of the PIPJ.  No purulence no malodor no bone is visible.  Assessment: Painful hallux valgus with painful hallux rubbing the third toe resulting in ulceration.  Plan: Consented him today for amputation of the hallux at the level of the metatarsophalangeal joint.  Provided him a prescription of Augmentin 875 1 p.o.twice daily for the next 2 weeks to help heal the toe.  I did discuss with him wrapping the toe much thicker to help keep the pressure off of it.  He understands and is amendable to it.

## 2021-03-21 ENCOUNTER — Telehealth: Payer: Self-pay

## 2021-03-21 ENCOUNTER — Inpatient Hospital Stay: Payer: Medicare Other | Attending: Hematology and Oncology | Admitting: Hematology and Oncology

## 2021-03-21 ENCOUNTER — Encounter: Payer: Self-pay | Admitting: Hematology and Oncology

## 2021-03-21 ENCOUNTER — Other Ambulatory Visit: Payer: Self-pay

## 2021-03-21 DIAGNOSIS — C9001 Multiple myeloma in remission: Secondary | ICD-10-CM | POA: Diagnosis not present

## 2021-03-21 DIAGNOSIS — L97529 Non-pressure chronic ulcer of other part of left foot with unspecified severity: Secondary | ICD-10-CM

## 2021-03-21 DIAGNOSIS — S98112A Complete traumatic amputation of left great toe, initial encounter: Secondary | ICD-10-CM | POA: Insufficient documentation

## 2021-03-21 DIAGNOSIS — L97509 Non-pressure chronic ulcer of other part of unspecified foot with unspecified severity: Secondary | ICD-10-CM | POA: Insufficient documentation

## 2021-03-21 NOTE — Assessment & Plan Note (Signed)
I have reviewed the notes from podiatrist Due to chronic nonhealing wound affecting the left foot toe, decision was made for him to proceed with surgical amputation next month As above, I recommend the patient to hold off Revlimid until I see him back at the end of the month There is no contraindication for him to proceed with surgery from my perspective

## 2021-03-21 NOTE — Telephone Encounter (Signed)
Called and scheduled virtual visit at 1140 today. He is aware of appt time.

## 2021-03-21 NOTE — Assessment & Plan Note (Signed)
I expressed my concern about risk of poor wound healing for his upcoming surgery I recommend the patient to hold Revlimid now until I see him back at the end of September The patient is currently on treatment break this week I think the timing is about right He will proceed with his surgery on September 2 and when I see him back at the end of the month, I will repeat his blood work and if everything is well healed, we will resume his treatment He is in agreement with the plan of care

## 2021-03-21 NOTE — Progress Notes (Signed)
HEMATOLOGY-ONCOLOGY ELECTRONIC VISIT PROGRESS NOTE  Patient Care Team: Jinny Sanders, MD as PCP - General Jeanann Lewandowsky, MD as Consulting Physician (Hematology and Oncology) Cathlean Marseilles, NP as Nurse Practitioner (Hematology and Oncology) Raynelle Bring, MD as Consulting Physician (Urology) Heath Lark, MD as Consulting Physician (Hematology and Oncology) Garrel Ridgel, DPM as Consulting Physician (Podiatry) Morton Stall, Howell Rucks, NP as Nurse Practitioner (Pain Medicine) Druscilla Brownie, MD as Consulting Physician (Dermatology) Bryson Ha, OD as Consulting Physician (Optometry)  I connected with by telephone call for his telephone virtual visit today.   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   INTERVAL HISTORY: Please see below for problem oriented charting. The purpose of today's visit is to discuss future refill of Revlimid His chart is reviewed The patient has chronic nonhealing wound affecting the left foot toe He was seen by  podiatrist and was prescribed antibiotics Decision was made for him to undergo toe amputation next month He just completed his course of Revlimid and is on the 7-day treatment break He denies recent fever or chills No recent bleeding  SUMMARY OF ONCOLOGIC HISTORY: Oncology History  Malignant neoplasm of prostate ([place])  02/23/2007 Initial Diagnosis   Malignant neoplasm of prostate ([place])   10/28/2020 Cancer Staging   Staging form: Prostate, AJCC 6th Edition - Clinical: Stage III (T3, N0, M0) - Signed by Heath Lark, MD on 10/28/2020     REVIEW OF SYSTEMS:   Constitutional: Denies fevers, chills or abnormal weight loss Eyes: Denies blurriness of vision Ears, nose, mouth, throat, and face: Denies mucositis or sore throat Respiratory: Denies cough, dyspnea or wheezes Cardiovascular: Denies palpitation, chest discomfort Gastrointestinal:  Denies nausea, heartburn or change in bowel habits Skin: Denies abnormal skin rashes Lymphatics: Denies new lymphadenopathy or easy bruising Neurological:Denies numbness, tingling or new weaknesses Behavioral/Psych: Mood is stable, no new changes  Extremities: No lower extremity edema All other systems were reviewed with the patient and are negative.  I have reviewed the past medical history, past surgical history, social history and family history with the patient and they are unchanged from previous note.  ALLERGIES:  is allergic to celebrex [celecoxib], cymbalta [duloxetine hcl], hydromorphone hcl, meloxicam, oxycontin [oxycodone], percocet [oxycodone-acetaminophen], vicodin [hydrocodone-acetaminophen], and lyrica [pregabalin].  MEDICATIONS:  Current Outpatient Medications  Medication Sig Dispense Refill   amoxicillin-clavulanate (AUGMENTIN) 875-125 MG tablet Take 1 tablet by mouth 2 (two) times daily. 20 tablet 0   aspirin 81 MG tablet Take 81  mg by mouth daily.     B Complex Vitamins (VITAMIN-B COMPLEX) TABS Take by mouth.     Calcium  Carbonate-Vitamin D 600-400 MG-UNIT per tablet Take by mouth.     cholecalciferol (VITAMIN D) 1000 UNITS tablet Take 1,000 Units by mouth daily.     doxycycline (VIBRA-TABS) 100 MG tablet Take 1 tablet (100 mg total) by mouth 2 (two) times daily. 20 tablet 0   fentaNYL (DURAGESIC) 75 MCG/HR APP 1 PA EXT TO THE SKIN QOD     lenalidomide (REVLIMID) 5 MG capsule Take 1 capsule by mouth every day for 21 days then 7 days off. 21 capsule 0   lidocaine-prilocaine (EMLA) cream Apply topically as needed. Apply to port site one hour before treatment and cover with plastic wrap 30 g 2   mupirocin ointment (BACTROBAN) 2 % Apply 1 application topically 2 (two) times daily. 22 g 0   NONFORMULARY OR COMPOUNDED ITEM Baclofn/amitrp/ketamn cream topically to feet TID prn for neuropathy     NUBEQA 300 MG tablet Take 600 mg by mouth 2 (two) times daily.     oxybutynin (DITROPAN) 5 MG tablet Take 5 mg by mouth 2 (two) times daily.     oxyCODONE (OXY IR/ROXICODONE) 5 MG immediate release tablet TK 1 T PO Q 4 H PRN P  0   pantoprazole (PROTONIX) 40 MG tablet TAKE 1 TABLET BY MOUTH EVERY DAY 90 tablet 3   No current facility-administered medications for this visit.    PHYSICAL EXAMINATION: ECOG PERFORMANCE STATUS: 1 - Symptomatic but completely ambulatory  LABORATORY DATA:  I have reviewed the data as listed CMP Latest Ref Rng & Units 02/18/2021 10/28/2020 06/28/2020  Glucose 70 - 99 mg/dL 108(H) 116(H) 121(H)  BUN 8 - 23 mg/dL _0 Creatinine 0.61 - 1.24 mg/dL 1.34(H) 1.34(H) 1.32(H)  Sodium 135 - 145 mmol/L 137 140 141  Potassium 3.5 - 5.1 mmol/L 3.9 3.6 3.6  Chloride 98 - 111 mmol/L 105 105 108  CO2 22 - 32 mmol/L _1 Calcium 8.9 - 10.3 mg/dL 8.9 8.2(L) 8.7(L)  Total Protein 6.5 - 8.1 g/dL 6.4(L) 6.4(L) 6.6  Total Bilirubin 0.3 - 1.2 mg/dL 0.9 1.0 0.7  Alkaline Phos 38 - 126 U/L 56 60 49  AST 15 - 41 U/L _2 ALT 0 - 44 U/L _3 Lab Results  Component Value Date   WBC 4.0  02/18/2021   HGB 10.8 (L) 02/18/2021   HCT 31.0 (L) 02/18/2021   MCV 104.7 (H) 02/18/2021   PLT 101 (L) 02/18/2021   NEUTROABS 1.6 (L) 02/18/2021     RADIOGRAPHIC STUDIES: I have personally reviewed the radiological images as listed and agreed with the findings in the report. VAS [place] ABI WITH/WO TBI  Result Date: 03/04/2021  LOWER EXTREMITY DOPPLER STUDY Patient Name:  LARWENCE TU  Date of Exam:   03/03/2021 Medical Rec #: [id number]        Accession #:    [id number] Date of Birth: [birth year]        Patient Gender: M Patient Age:   28 years Exam Location:  [facility] Procedure:      VAS [place] ABI WITH/WO TBI Referring Phys: MAX HYATT --------------------------------------------------------------------------------  Indications: Ulceration, and Decreased Pulses.  Performing Technologist: Almira Coaster RVS  Examination Guidelines: A complete evaluation includes at minimum, Doppler waveform signals and systolic blood pressure reading at the level of bilateral brachial, anterior tibial,  and posterior tibial arteries, when vessel segments are accessible. Bilateral testing is considered an integral part of a complete examination. Photoelectric Plethysmograph (PPG) waveforms and toe systolic pressure readings are included as required and additional duplex testing as needed. Limited examinations for reoccurring indications may be performed as noted.  ABI Findings: +---------+------------------+-----+---------+--------+ Right    Rt Pressure (mmHg)IndexWaveform Comment  +---------+------------------+-----+---------+--------+ Brachial 138                                      +---------+------------------+-----+---------+--------+ ATA      161                    triphasic1.13     +---------+------------------+-----+---------+--------+ PTA      151               1.06 triphasic         +---------+------------------+-----+---------+--------+ Great Toe166               1.17 Normal             +---------+------------------+-----+---------+--------+ +---------+------------------+-----+---------+-------+ Left     Lt Pressure (mmHg)IndexWaveform Comment +---------+------------------+-----+---------+-------+ Brachial 142                                     +---------+------------------+-----+---------+-------+ ATA      167                    triphasic1.18    +---------+------------------+-----+---------+-------+ PTA      162               1.14 triphasic        +---------+------------------+-----+---------+-------+ Great Toe146               1.03 Normal           +---------+------------------+-----+---------+-------+ +-------+-----------+-----------+------------+------------+ ABI/TBIToday's ABIToday's TBIPrevious ABIPrevious TBI +-------+-----------+-----------+------------+------------+ Right  1.13       1.17                                +-------+-----------+-----------+------------+------------+ Left   1.18       1.03                                +-------+-----------+-----------+------------+------------+  Summary: Right: Resting right ankle-brachial index is within normal range. No evidence of significant right lower extremity arterial disease. The right toe-brachial index is normal. Left: Resting left ankle-brachial index is within normal range. No evidence of significant left lower extremity arterial disease. The left toe-brachial index is normal.  *See table(s) above for measurements and observations.  Electronically signed by Leotis Pain MD on 03/04/2021 at 5:17:30 PM.    Final     I discussed the assessment and treatment plan with the patient. The patient was provided an opportunity to ask questions and all were answered. The patient agreed with the plan and demonstrated an understanding of the instructions. The patient was advised to call back or seek an in-person evaluation if the symptoms worsen or if the condition fails to improve as  anticipated.    I spent 20 minutes for the appointment reviewing test results, discuss management and coordination of care.  Heath Lark, MD 03/21/2021 12:21 PM

## 2021-03-26 ENCOUNTER — Other Ambulatory Visit: Payer: Self-pay | Admitting: Podiatry

## 2021-03-26 MED ORDER — CEPHALEXIN 500 MG PO CAPS: 500.0000 mg | ORAL_CAPSULE | Freq: Three times a day (TID) | ORAL | 0 refills | Status: DC

## 2021-03-26 MED ORDER — ONDANSETRON HCL 4 MG PO TABS: 4.0000 mg | ORAL_TABLET | Freq: Three times a day (TID) | ORAL | 0 refills | Status: DC | PRN

## 2021-03-27 ENCOUNTER — Telehealth: Payer: Self-pay

## 2021-03-27 NOTE — Telephone Encounter (Signed)
[name] called to cancel his surgery with Dr. Milinda Pointer on 03/28/2021. He stated his body hurts all over and he just can't do it right now. He did not want to reschedule at this time. Notified Dr. Milinda Pointer and Caren Griffins with [facility]

## 2021-03-28 ENCOUNTER — Other Ambulatory Visit: Payer: Self-pay

## 2021-03-28 ENCOUNTER — Emergency Department: Payer: Medicare Other

## 2021-03-28 ENCOUNTER — Emergency Department
Admission: EM | Admit: 2021-03-28 | Discharge: 2021-03-28 | Disposition: A | Discharge status: Home / Self Care | Payer: Medicare Other | Attending: Emergency Medicine | Admitting: Emergency Medicine

## 2021-03-28 ENCOUNTER — Encounter: Payer: Self-pay | Admitting: Emergency Medicine

## 2021-03-28 DIAGNOSIS — Z8546 Personal history of malignant neoplasm of prostate: Secondary | ICD-10-CM | POA: Diagnosis not present

## 2021-03-28 DIAGNOSIS — Z79899 Other long term (current) drug therapy: Secondary | ICD-10-CM | POA: Diagnosis not present

## 2021-03-28 DIAGNOSIS — Z7982 Long term (current) use of aspirin: Secondary | ICD-10-CM | POA: Insufficient documentation

## 2021-03-28 DIAGNOSIS — Z87891 Personal history of nicotine dependence: Secondary | ICD-10-CM | POA: Diagnosis not present

## 2021-03-28 DIAGNOSIS — M1711 Unilateral primary osteoarthritis, right knee: Secondary | ICD-10-CM

## 2021-03-28 DIAGNOSIS — I129 Hypertensive chronic kidney disease with stage 1 through stage 4 chronic kidney disease, or unspecified chronic kidney disease: Secondary | ICD-10-CM | POA: Insufficient documentation

## 2021-03-28 DIAGNOSIS — M25561 Pain in right knee: Secondary | ICD-10-CM | POA: Diagnosis present

## 2021-03-28 DIAGNOSIS — N183 Chronic kidney disease, stage 3 unspecified: Secondary | ICD-10-CM | POA: Insufficient documentation

## 2021-03-28 DIAGNOSIS — M25461 Effusion, right knee: Secondary | ICD-10-CM | POA: Diagnosis not present

## 2021-03-28 LAB — CBC WITH DIFFERENTIAL/PLATELET
Abs Immature Granulocytes: 0.03 10*3/uL (ref 0.00–0.07)
Basophils Absolute: 0.1 10*3/uL (ref 0.0–0.1)
Basophils Relative: 1 %
Eosinophils Absolute: 0 10*3/uL (ref 0.0–0.5)
Eosinophils Relative: 0 %
HCT: 33.4 % — ABNORMAL LOW (ref 39.0–52.0)
Hemoglobin: 12.1 g/dL — ABNORMAL LOW (ref 13.0–17.0)
Immature Granulocytes: 1 %
Lymphocytes Relative: 15 %
Lymphs Abs: 0.8 10*3/uL (ref 0.7–4.0)
MCH: 37.8 pg — ABNORMAL HIGH (ref 26.0–34.0)
MCHC: 36.2 g/dL — ABNORMAL HIGH (ref 30.0–36.0)
MCV: 104.4 fL — ABNORMAL HIGH (ref 80.0–100.0)
Monocytes Absolute: 2 10*3/uL — ABNORMAL HIGH (ref 0.1–1.0)
Monocytes Relative: 35 %
Neutro Abs: 2.9 10*3/uL (ref 1.7–7.7)
Neutrophils Relative %: 48 %
Platelets: 166 10*3/uL (ref 150–400)
RBC: 3.2 MIL/uL — ABNORMAL LOW (ref 4.22–5.81)
RDW: 13.1 % (ref 11.5–15.5)
WBC: 5.8 10*3/uL (ref 4.0–10.5)
nRBC: 0 % (ref 0.0–0.2)

## 2021-03-28 LAB — BASIC METABOLIC PANEL
Anion gap: 6 (ref 5–15)
BUN: 19 mg/dL (ref 8–23)
CO2: 24 mmol/L (ref 22–32)
Calcium: 8.9 mg/dL (ref 8.9–10.3)
Chloride: 103 mmol/L (ref 98–111)
Creatinine, Ser: 1.35 mg/dL — ABNORMAL HIGH (ref 0.61–1.24)
GFR, Estimated: 55 mL/min — ABNORMAL LOW (ref >60)
Glucose, Bld: 127 mg/dL — ABNORMAL HIGH (ref 70–99)
Potassium: 4 mmol/L (ref 3.5–5.1)
Sodium: 133 mmol/L — ABNORMAL LOW (ref 135–145)

## 2021-03-28 LAB — URIC ACID: Uric Acid, Serum: 4.1 mg/dL (ref 3.7–8.6)

## 2021-03-28 MED ORDER — DICLOFENAC SODIUM 1 % EX GEL: 4.0000 g | Freq: Four times a day (QID) | CUTANEOUS | 1 refills | Status: DC

## 2021-03-28 NOTE — Discharge Instructions (Addendum)
Wear elastic knee support to assist with ambulation.  Remove support when sleeping.  Apply medication as directed.

## 2021-03-28 NOTE — ED Provider Notes (Signed)
[facility] Emergency Department Provider Note   ____________________________________________   Event Date/Time   First MD Initiated Contact with Patient 03/28/21 [phone]     (approximate)  I have reviewed the triage vital signs and the nursing notes.   HISTORY  Chief Complaint Knee Pain (/)    HPI [name] is a 75 y.o. male patient complain of nontraumatic right knee pain and edema for 3 to 4 days.  Patient states pain increased with weightbearing.  Rates pain as a 7/7.  Described pain as "achy".  No palliative measure for complaint.         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[place])    Peripheral neuropathy    toes, sees Dr Krista Blue   Prostate cancer [facility]) 2008   s/p prostatectomy   Prostate cancer ([place]) 03/2002   Renal insufficiency     Patient Active Problem List   Diagnosis Date Noted   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[place]) 03/21/2021   Deficiency anemia 02/18/2021   Hypocalcemia 10/28/2020   Squamous cell skin cancer 05/01/2020   History of skin cancer 10/20/2018   Preventive measure 05/14/2017   Menopausal syndrome (hot flashes) 05/14/2017   Tongue sore 10/30/2016   History of multiple myeloma 10/19/2016   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/2016   Port catheter in place 03/16/2016   AK (actinic keratosis) 01/31/2016   Chronic kidney disease, stage III (moderate) ([place]) 12/13/2015   Acquired  pancytopenia ([place]) 09/06/2015   Thrombocytopenia ([place]) 06/07/2015   Chronic back pain 06/07/2015   Leukopenia due to antineoplastic chemotherapy ([place]) 08/20/2014   Neuropathy due to chemotherapeutic drug ([place]) 07/13/2014   Peripheral neuropathy 03/02/2013   Osteoporosis 10/28/2012   Anemia of chronic disease 07/11/2011   Multiple myeloma in remission ([place]) 07/09/2011   Thrombocytopenia due to drugs 06/27/2011   Lumbar compression fracture ([place]) 06/10/2011   HYPERTRIGLYCERIDEMIA 08/18/2007   Malignant neoplasm of prostate ([place]) 02/23/2007   OBESITY 11/18/2006   Essential hypertension, benign 11/18/2006    Past Surgical History:  Procedure Laterality Date   BASAL CELL CARCINOMA EXCISION Left 10/22/2020   [place] (Dr. Leonie Man)   CATARACT EXTRACTION W/ INTRAOCULAR LENS IMPLANT Bilateral    DEXA  05/2011   spine 0.7, L femur 0.0, R femur -0.6, normal   KIDNEY STONE SURGERY     KIDNEY STONE SURGERY  06/2009   Stone removed from bladder   MELANOMA EXCISION Left 06/05/2019   Procedure: REEXCISION MELANOMA OF LEFT POSTERIOR AURICLE;  Surgeon: Johnathan Hausen, MD;  Location: [facility];  Service: General;  Laterality: Left;   PORTACATH PLACEMENT  08/05/11   right sided portacath   PROSTATECTOMY  04/06/07   TOE AMPUTATION Left 2017   2nd toe left foot    Prior to Admission medications   Medication Sig Start Date End Date Taking? Authorizing Provider  diclofenac Sodium (VOLTAREN)  1 % GEL Apply 4 g topically 4 (four) times daily. 03/28/21  Yes Sable Feil, PA-C  amoxicillin-clavulanate (AUGMENTIN) 875-125 MG tablet Take 1 tablet by mouth 2 (two) times daily. 03/19/21   Hyatt, Max T, DPM  aspirin 81 MG tablet Take 81 mg by mouth daily.    [provider]  B Complex Vitamins (VITAMIN-B COMPLEX) TABS Take by mouth.    [provider]  Calcium Carbonate-Vitamin D 600-400 MG-UNIT per tablet Take by mouth.    [provider]  cephALEXin (KEFLEX) 500  MG capsule Take 1 capsule (500 mg total) by mouth 3 (three) times daily. 03/26/21   Hyatt, Max T, DPM  cholecalciferol (VITAMIN D) 1000 UNITS tablet Take 1,000 Units by mouth daily.    [provider]  doxycycline (VIBRA-TABS) 100 MG tablet Take 1 tablet (100 mg total) by mouth 2 (two) times daily. 01/08/21   Hyatt, Max T, DPM  fentaNYL (DURAGESIC) 75 MCG/HR APP 1 PA EXT TO THE SKIN QOD 08/25/18   [provider]  lenalidomide (REVLIMID) 5 MG capsule Take 1 capsule by mouth every day for 21 days then 7 days off. 02/13/21   Heath Lark, MD  lidocaine-prilocaine (EMLA) cream Apply topically as needed. Apply to port site one hour before treatment and cover with plastic wrap 10/13/19   Heath Lark, MD  mupirocin ointment (BACTROBAN) 2 % Apply 1 application topically 2 (two) times daily. 01/08/21   Hyatt, Max T, DPM  NONFORMULARY OR COMPOUNDED ITEM Baclofn/amitrp/ketamn cream topically to feet TID prn for neuropathy    [provider]  NUBEQA 300 MG tablet Take 600 mg by mouth 2 (two) times daily. 02/24/21   [provider]  ondansetron (ZOFRAN) 4 MG tablet Take 1 tablet (4 mg total) by mouth every 8 (eight) hours as needed. 03/26/21   Hyatt, Max T, DPM  oxybutynin (DITROPAN) 5 MG tablet Take 5 mg by mouth 2 (two) times daily. 06/17/19   [provider]  oxyCODONE (OXY IR/ROXICODONE) 5 MG immediate release tablet TK 1 T PO Q 4 H PRN P 12/16/15   [provider]  pantoprazole (PROTONIX) 40 MG tablet TAKE 1 TABLET BY MOUTH EVERY DAY 01/28/21   Heath Lark, MD    Allergies Celebrex [celecoxib], Cymbalta [duloxetine hcl], Hydromorphone hcl, Meloxicam, Oxycontin [oxycodone], Percocet [oxycodone-acetaminophen], Vicodin [hydrocodone-acetaminophen], and Lyrica [pregabalin]  Family History  Problem Relation Age of Onset   Pneumonia Mother    Stroke Father    Cancer Brother        lung   Kidney cancer Sister    Colon cancer Neg Hx     Social History Social  History   Tobacco Use   Smoking status: Former    Packs/day: 2.00    Years: 30.00    Pack years: 60.00    Types: Cigarettes    Quit date: 07/27/1996    Years since quitting: 24.6   Smokeless tobacco: Never  Substance Use Topics   Alcohol use: No   Drug use: No    Review of Systems  Constitutional: No fever/chills Eyes: No visual changes. ENT: No sore throat. Cardiovascular: Denies chest pain. Respiratory: Denies shortness of breath. Gastrointestinal: No abdominal pain.  No nausea, no vomiting.  No diarrhea.  No constipation. Genitourinary: Negative for dysuria. Musculoskeletal: Right knee pain. Skin: Negative for rash. Neurological: Negative for headaches, focal weakness or numbness. Endocrine: Arthritis, hyperlipidemia, hypertension, prostate CA, renal insufficiency. Hematological/Lymphatic: Multiple myeloma Allergic/Immunilogical: See extensive allergy list. ____________________________________________  PHYSICAL EXAM:  VITAL SIGNS: ED Triage Vitals  Enc Vitals Group     BP 03/28/21 0826 137/79     Pulse Rate 03/28/21 0826 71     Resp 03/28/21 0826 20     Temp 03/28/21 0826 98.1 F (36.7 C)     Temp Source 03/28/21 0826 Oral     SpO2 03/28/21 0826 100 %     Weight 03/28/21 0828 180 lb (81.6 kg)     Height 03/28/21 0828 _0  (1.854 m)     Head Circumference --      Peak Flow --      Pain Score 03/28/21 0827 7     Pain Loc --      Pain Edu? --      Excl. in [place]? --     Constitutional: Alert and oriented. Well appearing and in no acute distress. Eyes: Conjunctivae are normal. PERRL. EOMI. Head: Atraumatic. Nose: No congestion/rhinnorhea. Mouth/Throat: Mucous membranes are moist.  Oropharynx non-erythematous. Neck: No stridor.   Hematological/Lymphatic/Immunilogical: No cervical lymphadenopathy. Cardiovascular: Normal rate, regular rhythm. Grossly normal heart sounds.  Good peripheral circulation. Respiratory: Normal respiratory effort.  No retractions.  Lungs CTAB. Musculoskeletal: No obvious deformity to the right knee.  No erythema.  Moderate crepitus with palpation..  Mild effusion superior aspect of patella. Neurologic:  Normal speech and language. No gross focal neurologic deficits are appreciated. No gait instability. Skin:  Skin is warm, dry and intact. No rash noted. Psychiatric: Mood and affect are normal. Speech and behavior are normal.  ____________________________________________   LABS (all labs ordered are listed, but only abnormal results are displayed)  Labs Reviewed  BASIC METABOLIC PANEL - Abnormal; Notable for the following components:      Result Value   Sodium 133 (*)    Glucose, Bld 127 (*)    Creatinine, Ser 1.35 (*)    GFR, Estimated 55 (*)    All other components within normal limits  CBC WITH DIFFERENTIAL/PLATELET - Abnormal; Notable for the following components:   RBC 3.20 (*)    Hemoglobin 12.1 (*)    HCT 33.4 (*)    MCV 104.4 (*)    MCH 37.8 (*)    MCHC 36.2 (*)    Monocytes Absolute 2.0 (*)    All other components within normal limits  URIC ACID   ____________________________________________  EKG   ____________________________________________  RADIOLOGY I, Sable Feil, personally viewed and evaluated these images (plain radiographs) as part of my medical decision making, as well as reviewing the written report by the radiologist.  ED MD interpretation: Degenerative changes of mild effusion suprapatellar area of the right knee.  Official radiology report(s): DG Knee Complete 4 Views Right  Result Date: 03/28/2021 CLINICAL DATA:  Knee pain. EXAM: RIGHT KNEE - COMPLETE 4+ VIEW COMPARISON:  None. FINDINGS: No fracture. No subluxation or dislocation. Joint effusion evident in the suprapatellar joint space. Meniscal calcification evident bilaterally. IMPRESSION: 1. Joint effusion without acute bony abnormality. 2. Bilateral meniscal calcification compatible with chondrocalcinosis. Electronically  Signed   By: Misty Stanley M.D.   On: 03/28/2021 09:28    ____________________________________________   PROCEDURES  Procedure(s) performed (including Critical Care):  Procedures   ____________________________________________   INITIAL IMPRESSION / ASSESSMENT AND PLAN / ED COURSE  As part of my medical decision making, I reviewed the following data within the [place]         Patient presents with 3 to 4 days of increasing right knee  pain with mild edema.  Discussed degenerative changes of the right knee as seen on x-ray.  Patient has mild suprapatellar effusion.  Patient placed in elastic knee support and given a prescription for Voltaren.  Patient advised to follow-up with orthopedics for definitive evaluation and treatment.      ____________________________________________   FINAL CLINICAL IMPRESSION(S) / ED DIAGNOSES  Final diagnoses:  Effusion of right knee  Primary osteoarthritis of right knee     ED Discharge Orders          Ordered    diclofenac Sodium (VOLTAREN) 1 % GEL  4 times daily        03/28/21 1011             Note:  This document was prepared using Dragon voice recognition software and may include unintentional dictation errors.    Sable Feil, PA-C 03/28/21 1021    Blake Divine, MD 03/28/21 [phone]

## 2021-03-28 NOTE — ED Triage Notes (Signed)
Presents via EMS from home  States he developed right knee pain and swelling 3-4 days ago  no injury

## 2021-04-02 ENCOUNTER — Encounter: Payer: Medicare Other | Admitting: Podiatry

## 2021-04-02 ENCOUNTER — Telehealth: Payer: Self-pay

## 2021-04-02 NOTE — Telephone Encounter (Signed)
Returned his call. He is verifying upcoming appts with Dr. Alvy Bimler. He verbalized understanding.  He went to the ER at [facility] 9/2 for knee pain. Toe surgery was canceled due to him going to the ER. He is awaiting on surgery to be rescheduled.  Knee is feeling better and he is wearing knee brace/ using Voltaren cream. He saw ortho yesterday and they offered a cortisone injection. He is asking if you are okay with him getting a cortisone injection if needed?

## 2021-04-02 NOTE — Telephone Encounter (Signed)
Called and given below message. He verbalized understanding. 

## 2021-04-02 NOTE — Telephone Encounter (Signed)
Yes he can proceed with cortisone injection

## 2021-04-03 ENCOUNTER — Other Ambulatory Visit: Payer: Self-pay

## 2021-04-03 ENCOUNTER — Ambulatory Visit (INDEPENDENT_AMBULATORY_CARE_PROVIDER_SITE_OTHER): Payer: Medicare Other | Admitting: Podiatry

## 2021-04-03 DIAGNOSIS — L97521 Non-pressure chronic ulcer of other part of left foot limited to breakdown of skin: Secondary | ICD-10-CM | POA: Diagnosis not present

## 2021-04-03 MED ORDER — DOXYCYCLINE HYCLATE 100 MG PO TABS: 100.0000 mg | ORAL_TABLET | Freq: Two times a day (BID) | ORAL | 0 refills | Status: AC

## 2021-04-03 NOTE — Progress Notes (Signed)
x

## 2021-04-07 ENCOUNTER — Telehealth: Payer: Self-pay | Admitting: Podiatry

## 2021-04-07 NOTE — Telephone Encounter (Signed)
Patient called the office thinking he had an appointment  this Friday for his left great toe amputation . He is concern , he would like ashley to called him.

## 2021-04-08 ENCOUNTER — Telehealth: Payer: Self-pay

## 2021-04-08 NOTE — Telephone Encounter (Signed)
Pls move to about 2 weeks after his surgery

## 2021-04-08 NOTE — Telephone Encounter (Signed)
He called and left a message. Surgery is now scheduled on 9/30. Do you want to move 9/26 appts?

## 2021-04-08 NOTE — Telephone Encounter (Signed)
Called back and moved appts to 10/14, he is aware of appt times and date.

## 2021-04-09 NOTE — Progress Notes (Signed)
Subjective:  Patient ID: [name], male    DOB: February 02, 1946,  MRN: [id number]  Chief Complaint  Patient presents with   Foot Ulcer    Left foot 2nd toe check     75 y.o. male presents with the above complaint.  Patient presents with evaluation for left third digit ulceration.  They wanted to make sure that it has not gone progressively worse.  Patient is known to Dr. Milinda Pointer who was planning on doing the left hallux amputation.  They wanted get evaluated for third digit amputation as well.  He was unable to undergo surgery 2 weeks ago.  He is more amenable to surgical intervention at this time.  He denies any other acute complaints no nausea fever chills vomiting.   Review of Systems: Negative except as noted in the HPI. Denies N/V/F/Ch.  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   DDD (degenerative disc disease)    Fractures    compression T12,L3   GERD (gastroesophageal reflux disease)    History of chemotherapy    weekly Velcade,Cytoxan   HTN (hypertension)    Hypercalcemia of malignancy    Hyperlipemia    Multiple myeloma 07/09/2011   Multiple myeloma    Myeloma kidney (HCC)    Peripheral neuropathy    toes, sees Dr Krista Blue   Prostate cancer [facility]) 2008   s/p prostatectomy   Prostate cancer ([place]) 03/2002   Renal insufficiency     Current Outpatient Medications:    doxycycline (VIBRA-TABS) 100 MG tablet, Take 1 tablet (100 mg total) by mouth 2 (two) times daily for 10 days., Disp: 20 tablet, Rfl: 0   amoxicillin-clavulanate (AUGMENTIN) 875-125 MG tablet, Take 1 tablet by mouth 2 (two) times daily., Disp: 20 tablet, Rfl: 0   aspirin 81 MG tablet, Take 81 mg by mouth daily., Disp: , Rfl:    B Complex Vitamins (VITAMIN-B COMPLEX) TABS, Take by mouth., Disp: ,  Rfl:    Calcium Carbonate-Vitamin D 600-400 MG-UNIT per tablet, Take by mouth., Disp: , Rfl:    cephALEXin (KEFLEX) 500 MG capsule, Take 1 capsule (500 mg total) by mouth 3 (three) times daily., Disp: 30 capsule, Rfl: 0   cholecalciferol (VITAMIN D) 1000 UNITS tablet, Take 1,000 Units by mouth daily., Disp: , Rfl:    diclofenac Sodium (VOLTAREN) 1 % GEL, Apply 4 g topically 4 (four) times daily., Disp: 150 g, Rfl: 1   doxycycline (VIBRA-TABS) 100 MG tablet, Take 1 tablet (100 mg total) by mouth 2 (two) times daily., Disp: 20 tablet, Rfl: 0   fentaNYL (DURAGESIC) 75 MCG/HR, APP 1 PA EXT TO THE SKIN QOD, Disp: , Rfl:    lenalidomide (REVLIMID) 5 MG capsule, Take 1 capsule by mouth every day for 21 days then 7 days off., Disp: 21 capsule, Rfl: 0   lidocaine-prilocaine (EMLA) cream, Apply topically as needed. Apply to port site one hour before treatment and cover with plastic wrap, Disp: 30 g, Rfl: 2   mupirocin ointment (BACTROBAN) 2 %, Apply 1 application topically 2 (two) times daily., Disp: 22 g, Rfl: 0   NONFORMULARY OR COMPOUNDED ITEM, Baclofn/amitrp/ketamn cream topically to feet TID prn for neuropathy, Disp: , Rfl:    NUBEQA 300 MG tablet, Take 600 mg by mouth 2 (two)  times daily., Disp: , Rfl:    ondansetron (ZOFRAN) 4 MG tablet, Take 1 tablet (4 mg total) by mouth every 8 (eight) hours as needed., Disp: 20 tablet, Rfl: 0   oxybutynin (DITROPAN) 5 MG tablet, Take 5 mg by mouth 2 (two) times daily., Disp: , Rfl:    oxyCODONE (OXY IR/ROXICODONE) 5 MG immediate release tablet, TK 1 T PO Q 4 H PRN P, Disp: , Rfl: 0   pantoprazole (PROTONIX) 40 MG tablet, TAKE 1 TABLET BY MOUTH EVERY DAY, Disp: 90 tablet, Rfl: 3  Social History   Tobacco Use  Smoking Status Former   Packs/day: 2.00   Years: 30.00   Pack years: 60.00   Types: Cigarettes   Quit date: 07/27/1996   Years since quitting: 24.7  Smokeless Tobacco Never    Allergies  Allergen Reactions   Celebrex [Celecoxib] Nausea Only    Cymbalta [Duloxetine Hcl] Other (See Comments) and Hives    Weight loss Weight loss   Hydromorphone Hcl Itching   Meloxicam Itching   Oxycontin [Oxycodone] Itching    Also caused constipation, insomnia Also caused constipation, insomnia Also caused constipation, insomnia   Percocet [Oxycodone-Acetaminophen] Itching   Vicodin [Hydrocodone-Acetaminophen] Itching   Lyrica [Pregabalin] Rash    Lesions   Objective:  There were no vitals filed for this visit. There is no height or weight on file to calculate BMI. Constitutional Well developed. Well nourished.  Vascular Dorsalis pedis pulses palpable bilaterally. Posterior tibial pulses palpable bilaterally. Capillary refill normal to all digits.  No cyanosis or clubbing noted. Pedal hair growth normal.  Neurologic Normal speech. Oriented to person, place, and time. Epicritic sensation to light touch grossly present bilaterally.  Dermatologic Nails well groomed and normal in appearance. No open wounds. No skin lesions.  Orthopedic: Ulceration/soft tissue deficit noted to the left third digit.  Probing down to bone.  No purulent drainage noted no erythema noted.  Valgus deformity noted of the left hallux.   Radiographs: None Assessment:   1. Skin ulcer of toe of left foot, limited to breakdown of skin ([place])    Plan:  Patient was evaluated and treated and all questions answered.  Left third digit ulcer medial aspect probes down to bone -Explained to the patient the etiology of ulceration versus treatment options were discussed.  The problem primary culprit is likely the hallux bumping into the third digit leading to ulceration.  I discussed with the patient the importance of offloading and prevention with shoe gear modification extensive detail.  Ultimately I believe patient will benefit from right third digit amputation as well given that the wound is probing down to the bone.  He did that he does not have any other signs of  infection including purulent drainage or erythema or redness.  I believe he will benefit from continuous treatment with antibiotics with doxycycline.  I have placed him on doxycycline.  He will follow-up with Dr. Milinda Pointer for further evaluation and management.   No follow-ups on file.

## 2021-04-10 ENCOUNTER — Encounter: Payer: Medicare Other | Admitting: Podiatry

## 2021-04-16 ENCOUNTER — Other Ambulatory Visit: Payer: Self-pay

## 2021-04-16 ENCOUNTER — Ambulatory Visit (INDEPENDENT_AMBULATORY_CARE_PROVIDER_SITE_OTHER): Payer: Medicare Other | Admitting: Podiatry

## 2021-04-16 ENCOUNTER — Ambulatory Visit: Payer: Medicare Other | Admitting: Podiatry

## 2021-04-16 DIAGNOSIS — L97521 Non-pressure chronic ulcer of other part of left foot limited to breakdown of skin: Secondary | ICD-10-CM | POA: Diagnosis not present

## 2021-04-16 MED ORDER — AMOXICILLIN-POT CLAVULANATE 875-125 MG PO TABS: 1.0000 | ORAL_TABLET | Freq: Two times a day (BID) | ORAL | 0 refills | Status: DC

## 2021-04-16 NOTE — Progress Notes (Signed)
He presents today to sign his new consent after seeing Dr. Posey Pronto last week.  He was unable to have his hallux amputated which resulted in a ulcerative lesion developing on the third toe.  Once the ulcerative lesion developed according to Dr. Posey Pronto and now demonstrates osteomyelitis.  Objective: Vital signs are stable alert and oriented x3.  Pulses remain palpable left.  Hallux left does demonstrate such a lateral position that is pushing on the distal medial aspect of the third toe since the second toe was amputated previously.  He would like to go ahead and remove that first and part of the third toe.  On physical exam today I did trim the granulation tissue and this does probe deep to bone and there is crepitus of the joint.  This does not appear to be a infection of the joint itself rather breaking down of the bone and infection of the bone.  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  Plan: Discussed etiology pathology and surgical therapies at this point went ahead and performed a consent signing today once again removal of the hallux at the metatarsophalangeal joint and removal of the third toe or at least portion of it.  He understands this is amendable to it signed this once again I think he is scheduled for 30 September.

## 2021-04-21 ENCOUNTER — Other Ambulatory Visit: Payer: Medicare Other

## 2021-04-21 ENCOUNTER — Ambulatory Visit: Payer: Medicare Other | Admitting: Hematology and Oncology

## 2021-04-23 ENCOUNTER — Encounter: Payer: Medicare Other | Admitting: Podiatry

## 2021-04-25 ENCOUNTER — Telehealth: Payer: Self-pay

## 2021-04-25 DIAGNOSIS — L97521 Non-pressure chronic ulcer of other part of left foot limited to breakdown of skin: Secondary | ICD-10-CM | POA: Diagnosis not present

## 2021-04-25 MED ORDER — AMOXICILLIN-POT CLAVULANATE 875-125 MG PO TABS: 1.0000 | ORAL_TABLET | Freq: Two times a day (BID) | ORAL | 0 refills | Status: DC

## 2021-04-25 NOTE — Telephone Encounter (Signed)
[name] called in and stated that the pt would like [facility]

## 2021-04-25 NOTE — Telephone Encounter (Signed)
Noted  

## 2021-04-29 ENCOUNTER — Other Ambulatory Visit: Payer: Self-pay | Admitting: Podiatry

## 2021-04-29 ENCOUNTER — Telehealth: Payer: Self-pay | Admitting: Podiatry

## 2021-04-29 MED ORDER — CEPHALEXIN 500 MG PO CAPS: 500.0000 mg | ORAL_CAPSULE | Freq: Three times a day (TID) | ORAL | 0 refills | Status: DC

## 2021-04-29 NOTE — Telephone Encounter (Signed)
Patient called and stated that the medication Augmentin gives him diarrhea and he wanted to know if Dr. Milinda Pointer can prescribe something else.

## 2021-04-30 ENCOUNTER — Ambulatory Visit (INDEPENDENT_AMBULATORY_CARE_PROVIDER_SITE_OTHER): Payer: Medicare Other

## 2021-04-30 ENCOUNTER — Other Ambulatory Visit: Payer: Self-pay

## 2021-04-30 ENCOUNTER — Ambulatory Visit (INDEPENDENT_AMBULATORY_CARE_PROVIDER_SITE_OTHER): Payer: Medicare Other | Admitting: Podiatry

## 2021-04-30 DIAGNOSIS — L97521 Non-pressure chronic ulcer of other part of left foot limited to breakdown of skin: Secondary | ICD-10-CM

## 2021-04-30 DIAGNOSIS — Z9889 Other specified postprocedural states: Secondary | ICD-10-CM

## 2021-04-30 NOTE — Progress Notes (Signed)
He presents today for his first postop visit date of surgery 04/25/2021 disarticulation great toe left and partial amputation third digit left he denies fever chills nausea vomiting muscle aches and pains.  Objective: Presents today CAM Walker.  I dressed her dressing intact was removed demonstrates some mild erythema around the distal aspect of the incision site.  No purulence no malodor.  Assessment: Well-healing surgical foot  Plan: Redressed today dressed a compressive dressing he will follow-up with Dr. Sherryle Lis next week at which time most likely elbasvir dressing change I imagine the stitches will stay in for another couple of weeks.

## 2021-05-02 ENCOUNTER — Encounter: Payer: Self-pay | Admitting: Podiatry

## 2021-05-07 ENCOUNTER — Other Ambulatory Visit: Payer: Self-pay

## 2021-05-07 ENCOUNTER — Ambulatory Visit (INDEPENDENT_AMBULATORY_CARE_PROVIDER_SITE_OTHER): Payer: Medicare Other | Admitting: Podiatry

## 2021-05-07 DIAGNOSIS — Z89422 Acquired absence of other left toe(s): Secondary | ICD-10-CM

## 2021-05-07 DIAGNOSIS — Z89412 Acquired absence of left great toe: Secondary | ICD-10-CM | POA: Diagnosis not present

## 2021-05-07 MED ORDER — DOXYCYCLINE HYCLATE 100 MG PO TABS: 100.0000 mg | ORAL_TABLET | Freq: Two times a day (BID) | ORAL | 0 refills | Status: DC

## 2021-05-08 NOTE — Progress Notes (Signed)
  Subjective:  Patient ID: MIKE HAMRE, male    DOB: 16-Mar-1946,  MRN: [id number]    DOS: 04/25/2021 Procedure: Amputation hallux and partial amp third toe left foot  75 y.o. male returns for post-op check.   Review of Systems: Negative except as noted in the HPI. Denies N/V/F/Ch.   Objective:  There were no vitals filed for this visit. There is no height or weight on file to calculate BMI. Constitutional Well developed. Well nourished.  Vascular Foot warm and well perfused. Capillary refill normal to all digits.   Neurologic Normal speech. Oriented to person, place, and time. Epicritic sensation to light touch grossly present bilaterally.  Dermatologic Skin healing well there is some erythema  Orthopedic: Tenderness to palpation noted about the surgical site.    Assessment:   1. Status post amputation of great toe, left ([place])   2. Status post amputation of lesser toe of left foot ([place])    Plan:  Patient was evaluated and treated and all questions answered.  S/p foot surgery  -Healing well without any signs of infection.  Would leave sutures intact for 2 more weeks and can remove postop week 4.  I recommended he is able to remove the dressing and begin to shower no soaking throbbing of the incision. -Due to the erythema I switched him from cephalexin to doxycycline  Return in about 2 weeks (around 05/21/2021) for suture removal.

## 2021-05-09 ENCOUNTER — Other Ambulatory Visit: Payer: Self-pay

## 2021-05-09 ENCOUNTER — Encounter: Payer: Self-pay | Admitting: Hematology and Oncology

## 2021-05-09 ENCOUNTER — Inpatient Hospital Stay: Payer: Medicare Other | Attending: Hematology and Oncology

## 2021-05-09 ENCOUNTER — Telehealth: Payer: Self-pay

## 2021-05-09 ENCOUNTER — Inpatient Hospital Stay (HOSPITAL_BASED_OUTPATIENT_CLINIC_OR_DEPARTMENT_OTHER): Payer: Medicare Other | Admitting: Hematology and Oncology

## 2021-05-09 DIAGNOSIS — E538 Deficiency of other specified B group vitamins: Secondary | ICD-10-CM

## 2021-05-09 DIAGNOSIS — Z95828 Presence of other vascular implants and grafts: Secondary | ICD-10-CM

## 2021-05-09 DIAGNOSIS — D539 Nutritional anemia, unspecified: Secondary | ICD-10-CM

## 2021-05-09 DIAGNOSIS — C9001 Multiple myeloma in remission: Secondary | ICD-10-CM | POA: Diagnosis present

## 2021-05-09 DIAGNOSIS — C61 Malignant neoplasm of prostate: Secondary | ICD-10-CM | POA: Insufficient documentation

## 2021-05-09 DIAGNOSIS — D638 Anemia in other chronic diseases classified elsewhere: Secondary | ICD-10-CM

## 2021-05-09 LAB — CMP (CANCER CENTER ONLY)
ALT: 12 U/L (ref 0–44)
AST: 22 U/L (ref 15–41)
Albumin: 3.1 g/dL — ABNORMAL LOW (ref 3.5–5.0)
Alkaline Phosphatase: 62 U/L (ref 38–126)
Anion gap: 7 (ref 5–15)
BUN: 22 mg/dL (ref 8–23)
CO2: 26 mmol/L (ref 22–32)
Calcium: 8.5 mg/dL — ABNORMAL LOW (ref 8.9–10.3)
Chloride: 104 mmol/L (ref 98–111)
Creatinine: 1.35 mg/dL — ABNORMAL HIGH (ref 0.61–1.24)
GFR, Estimated: 55 mL/min — ABNORMAL LOW (ref >60)
Glucose, Bld: 135 mg/dL — ABNORMAL HIGH (ref 70–99)
Potassium: 3.7 mmol/L (ref 3.5–5.1)
Sodium: 137 mmol/L (ref 135–145)
Total Bilirubin: 0.7 mg/dL (ref 0.3–1.2)
Total Protein: 6.7 g/dL (ref 6.5–8.1)

## 2021-05-09 LAB — CBC WITH DIFFERENTIAL/PLATELET
Abs Immature Granulocytes: 0.01 10*3/uL (ref 0.00–0.07)
Basophils Absolute: 0 10*3/uL (ref 0.0–0.1)
Basophils Relative: 0 %
Eosinophils Absolute: 0.1 10*3/uL (ref 0.0–0.5)
Eosinophils Relative: 2 %
HCT: 32 % — ABNORMAL LOW (ref 39.0–52.0)
Hemoglobin: 10.9 g/dL — ABNORMAL LOW (ref 13.0–17.0)
Immature Granulocytes: 0 %
Lymphocytes Relative: 19 %
Lymphs Abs: 1 10*3/uL (ref 0.7–4.0)
MCH: 35 pg — ABNORMAL HIGH (ref 26.0–34.0)
MCHC: 34.1 g/dL (ref 30.0–36.0)
MCV: 102.9 fL — ABNORMAL HIGH (ref 80.0–100.0)
Monocytes Absolute: 0.7 10*3/uL (ref 0.1–1.0)
Monocytes Relative: 13 %
Neutro Abs: 3.5 10*3/uL (ref 1.7–7.7)
Neutrophils Relative %: 66 %
Platelets: 166 10*3/uL (ref 150–400)
RBC: 3.11 MIL/uL — ABNORMAL LOW (ref 4.22–5.81)
RDW: 13.5 % (ref 11.5–15.5)
WBC: 5.3 10*3/uL (ref 4.0–10.5)
nRBC: 0 % (ref 0.0–0.2)

## 2021-05-09 LAB — IRON AND TIBC
Iron: 148 ug/dL (ref 45–182)
Saturation Ratios: 94 % — ABNORMAL HIGH (ref 17.9–39.5)
TIBC: 157 ug/dL — ABNORMAL LOW (ref 250–450)
UIBC: 9 ug/dL

## 2021-05-09 LAB — VITAMIN B12: Vitamin B-12: 121 pg/mL — ABNORMAL LOW (ref 180–914)

## 2021-05-09 LAB — FERRITIN: Ferritin: 709 ng/mL — ABNORMAL HIGH (ref 24–336)

## 2021-05-09 MED ORDER — SODIUM CHLORIDE 0.9% FLUSH
10.0000 mL | INTRAVENOUS | Status: DC | PRN
  Administered 2021-05-09: 10 mL

## 2021-05-09 MED ORDER — HEPARIN SOD (PORK) LOCK FLUSH 100 UNIT/ML IV SOLN
500.0000 [IU] | Freq: Once | INTRAVENOUS | Status: AC | PRN
  Administered 2021-05-09: 500 [IU] via INTRAVENOUS

## 2021-05-09 NOTE — Telephone Encounter (Signed)
Called and given below message. He verbalized understanding. 

## 2021-05-09 NOTE — Assessment & Plan Note (Signed)
The cause of his anemia is multifactorial At the time of dictation, he is found to have B12 deficiency I suggest to the patient to take oral vitamin B12 If he is not able to replenish with oral supplement, we can prescribe B12 injection in the future

## 2021-05-09 NOTE — Telephone Encounter (Signed)
-----   Message from Heath Lark, MD sent at 05/09/2021  2:18 PM EDT ----- Pls tell him B12 level is low I suggest OTC B12 supplement 1000 mcg I will recheck it next month and if still low we will schedule B12 injection

## 2021-05-09 NOTE — Assessment & Plan Note (Signed)
I expressed my concern about risk of poor wound healing for since his surgery The patient is still on antibiotics I recommend the patient to hold Revlimid now until I see him back next month Will call him about test results

## 2021-05-09 NOTE — Progress Notes (Signed)
[facility] OFFICE PROGRESS NOTE  Patient Care Team: Jinny Sanders, MD as PCP - General Jeanann Lewandowsky, MD as Consulting Physician (Hematology and Oncology) Cathlean Marseilles, NP as Nurse Practitioner (Hematology and Oncology) Raynelle Bring, MD as Consulting Physician (Urology) Heath Lark, MD as Consulting Physician (Hematology and Oncology) Garrel Ridgel, DPM as Consulting Physician (Podiatry) Morton Stall, Howell Rucks, NP as Nurse Practitioner (Pain Medicine) Druscilla Brownie, MD as Consulting Physician (Dermatology) Bryson Ha, OD as Consulting Physician (Optometry)  ASSESSMENT & PLAN:  Multiple myeloma in remission [facility]) I expressed my concern about risk of poor wound healing for since his surgery The patient is still on antibiotics I recommend the patient to hold Revlimid now until I see him back next month Will call him about test results  Anemia of chronic disease The cause of his anemia is multifactorial At the time of dictation, he is found to have B12 deficiency I suggest to the patient to take oral vitamin B12 If he is not able to replenish with oral supplement, we can prescribe B12 injection in the future  Malignant neoplasm of prostate [facility]) According to the patient, his recent PSA show positive response to treatment I would defer to his urologist for management  Orders Placed This Encounter  Procedures   CMP ([place] only)   Vitamin B12    Standing Status:   Future    Standing Expiration Date:   05/09/2022    All questions were answered. The patient knows to call the clinic with any problems, questions or concerns. The total time spent in the appointment was 20 minutes encounter with patients including review of chart and various tests results, discussions about plan of care and coordination of care plan   Heath Lark, MD 05/09/2021 2:24 PM  INTERVAL HISTORY: Please see below for problem oriented charting. he returns for treatment  follow-up for multiple myeloma His treatment is placed on hold due to recent toe surgery According to the patient, he has been placed on multiple courses of antibiotics and is noted to have poor wound healing His recent PSA was satisfactory  REVIEW OF SYSTEMS:   Constitutional: Denies fevers, chills or abnormal weight loss Eyes: Denies blurriness of vision Ears, nose, mouth, throat, and face: Denies mucositis or sore throat Respiratory: Denies cough, dyspnea or wheezes Cardiovascular: Denies palpitation, chest discomfort or lower extremity swelling Gastrointestinal:  Denies nausea, heartburn or change in bowel habits Skin: Denies abnormal skin rashes Lymphatics: Denies new lymphadenopathy or easy bruising Neurological:Denies numbness, tingling or new weaknesses Behavioral/Psych: Mood is stable, no new changes  All other systems were reviewed with the patient and are negative.  I have reviewed the past medical history, past surgical history, social history and family history with the patient and they are unchanged from previous note.  ALLERGIES:  is allergic to celebrex [celecoxib], cymbalta [duloxetine hcl], hydromorphone hcl, meloxicam, oxycontin [oxycodone], percocet [oxycodone-acetaminophen], vicodin [hydrocodone-acetaminophen], and lyrica [pregabalin].  MEDICATIONS:  Current Outpatient Medications  Medication Sig Dispense Refill   aspirin 81 MG tablet Take 81 mg by mouth daily.     B Complex Vitamins (VITAMIN-B COMPLEX) TABS Take by mouth.     Calcium Carbonate-Vitamin D 600-400 MG-UNIT per tablet Take by mouth.     cholecalciferol (VITAMIN D) 1000 UNITS tablet Take 1,000 Units by mouth daily.     diclofenac Sodium (VOLTAREN) 1 % GEL Apply 4 g topically 4 (four) times daily. 150 g 1   doxycycline (VIBRA-TABS) 100 MG tablet Take  1 tablet (100 mg total) by mouth 2 (two) times daily. 20 tablet 0   fentaNYL (DURAGESIC) 75 MCG/HR APP 1 PA EXT TO THE SKIN QOD     lenalidomide (REVLIMID)  5 MG capsule Take 1 capsule by mouth every day for 21 days then 7 days off. 21 capsule 0   lidocaine-prilocaine (EMLA) cream Apply topically as needed. Apply to port site one hour before treatment and cover with plastic wrap 30 g 2   mupirocin ointment (BACTROBAN) 2 % Apply 1 application topically 2 (two) times daily. 22 g 0   NONFORMULARY OR COMPOUNDED ITEM Baclofn/amitrp/ketamn cream topically to feet TID prn for neuropathy     NUBEQA 300 MG tablet Take 600 mg by mouth 2 (two) times daily.     ondansetron (ZOFRAN) 4 MG tablet Take 1 tablet (4 mg total) by mouth every 8 (eight) hours as needed. 20 tablet 0   oxybutynin (DITROPAN) 5 MG tablet Take 5 mg by mouth 2 (two) times daily.     oxyCODONE (OXY IR/ROXICODONE) 5 MG immediate release tablet TK 1 T PO Q 4 H PRN P  0   pantoprazole (PROTONIX) 40 MG tablet TAKE 1 TABLET BY MOUTH EVERY DAY 90 tablet 3   No current facility-administered medications for this visit.    SUMMARY OF ONCOLOGIC HISTORY: Oncology History  Malignant neoplasm of prostate ([place])  02/23/2007 Initial Diagnosis   Malignant neoplasm of prostate ([place])   10/28/2020 Cancer Staging   Staging form: Prostate, AJCC 6th Edition - Clinical: Stage III (T3, N0, M0) - Signed by Heath Lark, MD on 10/28/2020    1. Velcade, Cytoxan, and Decadron on a weekly basis from 07/10/2011 through 10/23/2011; 2. High-dose chemotherapy on 11/23/2011, consisting of carmustine 600 mg IV. He received cytarabine 400 mg IV on 12/24/2011, 12/25/2011, 12/26/2011, and 12/27/2011. He received etoposide 300 mg IV daily from 12/24/2011 through 12/27/2011, four doses. He received melphalan 280 mg IV on 12/28/2011.  3. 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  Current therapy: -Revlimid 5 mg daily, 3 weeks on, 1 week off. Revlimid was started on 05/11/2012.  Zometa was started on 09/02/2012.  Dental clearance was obtained.  He was diagnosed with prostate cancer and received treatment through  urology service  PHYSICAL EXAMINATION: ECOG PERFORMANCE STATUS: 1 - Symptomatic but completely ambulatory  Vitals:   05/09/21 1155  BP: 108/67  Pulse: 73  Resp: 18  Temp: 97.7 F (36.5 C)  SpO2: 100%   Filed Weights   05/09/21 1155  Weight: 184 lb 12.8 oz (83.8 kg)    GENERAL:alert, no distress and comfortable NEURO: alert & oriented x 3 with fluent speech, no focal motor/sensory deficits  LABORATORY DATA:  I have reviewed the data as listed    Component Value Date/Time   NA 137 05/09/2021 1122   NA 139 05/14/2017 1145   K 3.7 05/09/2021 1122   K 3.8 05/14/2017 1145   CL 104 05/09/2021 1122   CL 108 (H) 09/30/2013 0945   CL 110 (H) 12/29/2012 1151   CO2 26 05/09/2021 1122   CO2 27 05/14/2017 1145   GLUCOSE 135 (H) 05/09/2021 1122   GLUCOSE 109 05/14/2017 1145   GLUCOSE 127 (H) 12/29/2012 1151   BUN 22 05/09/2021 1122   BUN 17.9 05/14/2017 1145   CREATININE 1.35 (H) 05/09/2021 1122   CREATININE 1.3 05/14/2017 1145   CALCIUM 8.5 (L) 05/09/2021 1122   CALCIUM 9.3 05/14/2017 1145   PROT 6.7 05/09/2021 1122  PROT 6.2 05/14/2017 1145   PROT 6.6 05/14/2017 1145   ALBUMIN 3.1 (L) 05/09/2021 1122   ALBUMIN 3.3 (L) 05/14/2017 1145   AST 22 05/09/2021 1122   AST 19 05/14/2017 1145   ALT 12 05/09/2021 1122   ALT 18 05/14/2017 1145   ALKPHOS 62 05/09/2021 1122   ALKPHOS 45 05/14/2017 1145   BILITOT 0.7 05/09/2021 1122   BILITOT 0.67 05/14/2017 1145   GFRNONAA 55 (L) 05/09/2021 1122   GFRNONAA >60 09/30/2013 0945   GFRAA 59 (L) 04/05/2020 1031   GFRAA >60 09/30/2013 0945    No results found for: SPEP, UPEP  Lab Results  Component Value Date   WBC 5.3 05/09/2021   NEUTROABS 3.5 05/09/2021   HGB 10.9 (L) 05/09/2021   HCT 32.0 (L) 05/09/2021   MCV 102.9 (H) 05/09/2021   PLT 166 05/09/2021      Chemistry      Component Value Date/Time   NA 137 05/09/2021 1122   NA 139 05/14/2017 1145   K 3.7 05/09/2021 1122   K 3.8 05/14/2017 1145   CL 104 05/09/2021  1122   CL 108 (H) 09/30/2013 0945   CL 110 (H) 12/29/2012 1151   CO2 26 05/09/2021 1122   CO2 27 05/14/2017 1145   BUN 22 05/09/2021 1122   BUN 17.9 05/14/2017 1145   CREATININE 1.35 (H) 05/09/2021 1122   CREATININE 1.3 05/14/2017 1145      Component Value Date/Time   CALCIUM 8.5 (L) 05/09/2021 1122   CALCIUM 9.3 05/14/2017 1145   ALKPHOS 62 05/09/2021 1122   ALKPHOS 45 05/14/2017 1145   AST 22 05/09/2021 1122   AST 19 05/14/2017 1145   ALT 12 05/09/2021 1122   ALT 18 05/14/2017 1145   BILITOT 0.7 05/09/2021 1122   BILITOT 0.67 05/14/2017 1145

## 2021-05-09 NOTE — Assessment & Plan Note (Signed)
According to the patient, his recent PSA show positive response to treatment I would defer to his urologist for management

## 2021-05-12 LAB — KAPPA/LAMBDA LIGHT CHAINS
Kappa free light chain: 51.3 mg/L — ABNORMAL HIGH (ref 3.3–19.4)
Kappa, lambda light chain ratio: 1.86 — ABNORMAL HIGH (ref 0.26–1.65)
Lambda free light chains: 27.6 mg/L — ABNORMAL HIGH (ref 5.7–26.3)

## 2021-05-13 LAB — MULTIPLE MYELOMA PANEL, SERUM
Albumin SerPl Elph-Mcnc: 3.2 g/dL (ref 2.9–4.4)
Albumin/Glob SerPl: 1.2 (ref 0.7–1.7)
Alpha 1: 0.2 g/dL (ref 0.0–0.4)
Alpha2 Glob SerPl Elph-Mcnc: 0.7 g/dL (ref 0.4–1.0)
B-Globulin SerPl Elph-Mcnc: 0.7 g/dL (ref 0.7–1.3)
Gamma Glob SerPl Elph-Mcnc: 1.3 g/dL (ref 0.4–1.8)
Globulin, Total: 2.9 g/dL (ref 2.2–3.9)
IgA: 238 mg/dL (ref 61–437)
IgG (Immunoglobin G), Serum: 1304 mg/dL (ref 603–1613)
IgM (Immunoglobulin M), Srm: 36 mg/dL (ref 15–143)
Total Protein ELP: 6.1 g/dL (ref 6.0–8.5)

## 2021-05-14 ENCOUNTER — Encounter: Payer: Medicare Other | Admitting: Podiatry

## 2021-05-15 ENCOUNTER — Telehealth: Payer: Self-pay

## 2021-05-15 NOTE — Telephone Encounter (Signed)
-----   Message from Heath Lark, MD sent at 05/15/2021  8:02 AM EDT ----- Pls call and let him know he is still in remission from myeloma

## 2021-05-15 NOTE — Telephone Encounter (Signed)
Called and given below message. He verbalized understanding.  He forgot to mention at the last appt. He is up to date on covid vaccine and had flu vaccine in September.

## 2021-05-21 ENCOUNTER — Ambulatory Visit: Payer: Medicare Other | Admitting: Podiatry

## 2021-05-21 ENCOUNTER — Other Ambulatory Visit: Payer: Self-pay

## 2021-05-21 ENCOUNTER — Encounter: Payer: Self-pay | Admitting: Podiatry

## 2021-05-21 ENCOUNTER — Ambulatory Visit (INDEPENDENT_AMBULATORY_CARE_PROVIDER_SITE_OTHER): Payer: Medicare Other | Admitting: Podiatry

## 2021-05-21 DIAGNOSIS — D2372 Other benign neoplasm of skin of left lower limb, including hip: Secondary | ICD-10-CM

## 2021-05-21 DIAGNOSIS — Z89412 Acquired absence of left great toe: Secondary | ICD-10-CM

## 2021-05-21 DIAGNOSIS — L97521 Non-pressure chronic ulcer of other part of left foot limited to breakdown of skin: Secondary | ICD-10-CM

## 2021-05-21 DIAGNOSIS — B351 Tinea unguium: Secondary | ICD-10-CM

## 2021-05-21 DIAGNOSIS — D2371 Other benign neoplasm of skin of right lower limb, including hip: Secondary | ICD-10-CM | POA: Diagnosis not present

## 2021-05-21 DIAGNOSIS — M79676 Pain in unspecified toe(s): Secondary | ICD-10-CM | POA: Diagnosis not present

## 2021-05-21 NOTE — Progress Notes (Signed)
He presents today status post amputation hallux and third digit left states that he is doing quite well date of surgery 04/25/2021.  Objective: Vital signs are stable alert oriented x3 there is no erythema edema cellulitis drainage or odor incision sites appear to be healing very nicely no open lesions or wounds.  Nontender on palpation no signs of infection.  Assessment: Well-healing surgical foot left.  Plan: Redressed today and would allow him to start showering and using his regular shoe gear.  He will follow-up with me next month however he will follow-up with [facility] for a toe filler in December.

## 2021-05-23 ENCOUNTER — Other Ambulatory Visit: Payer: Self-pay | Admitting: Nurse Practitioner

## 2021-06-04 ENCOUNTER — Other Ambulatory Visit: Payer: Self-pay

## 2021-06-04 ENCOUNTER — Encounter: Payer: Self-pay | Admitting: Podiatry

## 2021-06-04 ENCOUNTER — Ambulatory Visit (INDEPENDENT_AMBULATORY_CARE_PROVIDER_SITE_OTHER): Payer: Medicare Other | Admitting: Podiatry

## 2021-06-04 DIAGNOSIS — Z89412 Acquired absence of left great toe: Secondary | ICD-10-CM

## 2021-06-04 DIAGNOSIS — L97521 Non-pressure chronic ulcer of other part of left foot limited to breakdown of skin: Secondary | ICD-10-CM

## 2021-06-04 MED ORDER — MUPIROCIN 2 % EX OINT: 1.0000 "application " | TOPICAL_OINTMENT | Freq: Two times a day (BID) | CUTANEOUS | 6 refills | Status: DC

## 2021-06-04 NOTE — Progress Notes (Signed)
He presents today for follow-up of his amputation hallux left and partial amputation of the third toe left.  He states that he is doing well having no problems he would like to cancel the appointment for the shoe filler.  Objective: Vital signs are stable alert oriented x3 there is no erythema edema cellulitis drainage or odor.  He appears to be healing very nicely no open lesions or wounds at this time.  Assessment: Well-healing surgical foot.  Plan: I explained to him that it was okay to cancel at this point but if we started to develop any problems with the 2 toes that he has left and we would most likely need to get the toe filler made.  I expressed to him that my concern would be that we would start to develop lesions on the toes from shifting in the shoes.

## 2021-06-20 ENCOUNTER — Inpatient Hospital Stay (HOSPITAL_BASED_OUTPATIENT_CLINIC_OR_DEPARTMENT_OTHER): Payer: Medicare Other | Admitting: Hematology and Oncology

## 2021-06-20 ENCOUNTER — Inpatient Hospital Stay: Payer: Medicare Other | Attending: Hematology and Oncology

## 2021-06-20 ENCOUNTER — Other Ambulatory Visit: Payer: Self-pay

## 2021-06-20 ENCOUNTER — Encounter: Payer: Self-pay | Admitting: Hematology and Oncology

## 2021-06-20 ENCOUNTER — Inpatient Hospital Stay: Payer: Medicare Other

## 2021-06-20 VITALS — BP 149/74 | HR 77 | Temp 97.7°F | Resp 16 | Ht 73.0 in | Wt 187.8 lb

## 2021-06-20 DIAGNOSIS — C9001 Multiple myeloma in remission: Secondary | ICD-10-CM

## 2021-06-20 DIAGNOSIS — C4492 Squamous cell carcinoma of skin, unspecified: Secondary | ICD-10-CM | POA: Diagnosis not present

## 2021-06-20 DIAGNOSIS — C61 Malignant neoplasm of prostate: Secondary | ICD-10-CM | POA: Diagnosis not present

## 2021-06-20 DIAGNOSIS — Z95828 Presence of other vascular implants and grafts: Secondary | ICD-10-CM | POA: Diagnosis not present

## 2021-06-20 DIAGNOSIS — Z7982 Long term (current) use of aspirin: Secondary | ICD-10-CM | POA: Insufficient documentation

## 2021-06-20 DIAGNOSIS — E538 Deficiency of other specified B group vitamins: Secondary | ICD-10-CM

## 2021-06-20 LAB — COMPREHENSIVE METABOLIC PANEL
ALT: 12 U/L (ref 0–44)
AST: 21 U/L (ref 15–41)
Albumin: 3.4 g/dL — ABNORMAL LOW (ref 3.5–5.0)
Alkaline Phosphatase: 75 U/L (ref 38–126)
Anion gap: 8 (ref 5–15)
BUN: 29 mg/dL — ABNORMAL HIGH (ref 8–23)
CO2: 23 mmol/L (ref 22–32)
Calcium: 8.9 mg/dL (ref 8.9–10.3)
Chloride: 109 mmol/L (ref 98–111)
Creatinine, Ser: 1.39 mg/dL — ABNORMAL HIGH (ref 0.61–1.24)
GFR, Estimated: 53 mL/min — ABNORMAL LOW (ref >60)
Glucose, Bld: 111 mg/dL — ABNORMAL HIGH (ref 70–99)
Potassium: 4.2 mmol/L (ref 3.5–5.1)
Sodium: 140 mmol/L (ref 135–145)
Total Bilirubin: 0.7 mg/dL (ref 0.3–1.2)
Total Protein: 6.9 g/dL (ref 6.5–8.1)

## 2021-06-20 LAB — CBC WITH DIFFERENTIAL/PLATELET
Abs Immature Granulocytes: 0.01 10*3/uL (ref 0.00–0.07)
Basophils Absolute: 0 10*3/uL (ref 0.0–0.1)
Basophils Relative: 0 %
Eosinophils Absolute: 0.2 10*3/uL (ref 0.0–0.5)
Eosinophils Relative: 4 %
HCT: 34.3 % — ABNORMAL LOW (ref 39.0–52.0)
Hemoglobin: 11.6 g/dL — ABNORMAL LOW (ref 13.0–17.0)
Immature Granulocytes: 0 %
Lymphocytes Relative: 22 %
Lymphs Abs: 1.3 10*3/uL (ref 0.7–4.0)
MCH: 34.5 pg — ABNORMAL HIGH (ref 26.0–34.0)
MCHC: 33.8 g/dL (ref 30.0–36.0)
MCV: 102.1 fL — ABNORMAL HIGH (ref 80.0–100.0)
Monocytes Absolute: 0.8 10*3/uL (ref 0.1–1.0)
Monocytes Relative: 14 %
Neutro Abs: 3.4 10*3/uL (ref 1.7–7.7)
Neutrophils Relative %: 60 %
Platelets: 153 10*3/uL (ref 150–400)
RBC: 3.36 MIL/uL — ABNORMAL LOW (ref 4.22–5.81)
RDW: 13.4 % (ref 11.5–15.5)
WBC: 5.7 10*3/uL (ref 4.0–10.5)
nRBC: 0 % (ref 0.0–0.2)

## 2021-06-20 LAB — VITAMIN B12: Vitamin B-12: 440 pg/mL (ref 180–914)

## 2021-06-20 MED ORDER — SODIUM CHLORIDE 0.9% FLUSH
10.0000 mL | INTRAVENOUS | Status: DC | PRN
  Administered 2021-06-20: 10 mL

## 2021-06-20 MED ORDER — HEPARIN SOD (PORK) LOCK FLUSH 100 UNIT/ML IV SOLN
500.0000 [IU] | Freq: Once | INTRAVENOUS | Status: AC | PRN
  Administered 2021-06-20: 500 [IU] via INTRAVENOUS

## 2021-06-20 NOTE — Assessment & Plan Note (Signed)
He will continue treatment as guided by urologist

## 2021-06-20 NOTE — Assessment & Plan Note (Signed)
I had an extensive discussion with the patient and his significant other regarding the role of continuing Revlimid versus stopping The patient has achieved complete remission for many years The Revlimid is causing issues with infection and risk of secondary malignancy After much discussion, he is in agreement not to resume Revlimid and focus on prostate cancer treatment The patient has been on Zometa for over 10 years and I think it is also time to discontinue to reduce risk of pathological fracture and osteonecrosis of the jaw He will continue calcium and vitamin D only and we can consider Zometa once a year I will call him next week once we have results of his myeloma panel

## 2021-06-20 NOTE — Progress Notes (Signed)
[facility] OFFICE PROGRESS NOTE  Patient Care Team: Jinny Sanders, MD as PCP - General Jeanann Lewandowsky, MD as Consulting Physician (Hematology and Oncology) Cathlean Marseilles, NP as Nurse Practitioner (Hematology and Oncology) Raynelle Bring, MD as Consulting Physician (Urology) Heath Lark, MD as Consulting Physician (Hematology and Oncology) Garrel Ridgel, [place] as Consulting Physician (Podiatry) Morton Stall, Howell Rucks, NP as Nurse Practitioner (Pain Medicine) Druscilla Brownie, MD as Consulting Physician (Dermatology) Bryson Ha, OD as Consulting Physician (Optometry)  ASSESSMENT & PLAN:  Multiple myeloma in remission [facility]) I had an extensive discussion with the patient and his significant other regarding the role of continuing Revlimid versus stopping The patient has achieved complete remission for many years The Revlimid is causing issues with infection and risk of secondary malignancy After much discussion, he is in agreement not to resume Revlimid and focus on prostate cancer treatment The patient has been on Zometa for over 10 years and I think it is also time to discontinue to reduce risk of pathological fracture and osteonecrosis of the jaw He will continue calcium and vitamin D only and we can consider Zometa once a year I will call him next week once we have results of his myeloma panel  Malignant neoplasm of prostate [facility]) He will continue treatment as guided by urologist  Squamous cell skin cancer I would defer management to his dermatologist  No orders of the defined types were placed in this encounter.   All questions were answered. The patient knows to call the clinic with any problems, questions or concerns. The total time spent in the appointment was 25 minutes encounter with patients including review of chart and various tests results, discussions about plan of care and coordination of care plan   Heath Lark, MD 06/20/2021 4:12 PM  INTERVAL  HISTORY: Please see below for problem oriented charting. 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 He is also getting treatment by urologist for prostate cancer He denies new bone pain  REVIEW OF SYSTEMS:   Constitutional: Denies fevers, chills or abnormal weight loss Eyes: Denies blurriness of vision Ears, nose, mouth, throat, and face: Denies mucositis or sore throat Respiratory: Denies cough, dyspnea or wheezes Cardiovascular: Denies palpitation, chest discomfort or lower extremity swelling Gastrointestinal:  Denies nausea, heartburn or change in bowel habits Skin: Denies abnormal skin rashes Lymphatics: Denies new lymphadenopathy or easy bruising Neurological:Denies numbness, tingling or new weaknesses Behavioral/Psych: Mood is stable, no new changes  All other systems were reviewed with the patient and are negative.  I have reviewed the past medical history, past surgical history, social history and family history with the patient and they are unchanged from previous note.  ALLERGIES:  is allergic to celebrex [celecoxib], cymbalta [duloxetine hcl], hydromorphone hcl, meloxicam, oxycontin [oxycodone], percocet [oxycodone-acetaminophen], vicodin [hydrocodone-acetaminophen], and lyrica [pregabalin].  MEDICATIONS:  Current Outpatient Medications  Medication Sig Dispense Refill   aspirin 81 MG tablet Take 81 mg by mouth daily.     B Complex Vitamins (VITAMIN-B COMPLEX) TABS Take by mouth.     Calcium Carbonate-Vitamin D 600-400 MG-UNIT per tablet Take by mouth.     cholecalciferol (VITAMIN D) 1000 UNITS tablet Take 1,000 Units by mouth daily.     diclofenac Sodium (VOLTAREN) 1 % GEL Apply 4 g topically 4 (four) times daily. 150 g 1   fentaNYL (DURAGESIC) 75  MCG/HR APP 1 PA EXT TO THE SKIN QOD     lidocaine-prilocaine (EMLA) cream Apply  topically as needed. Apply to port site one hour before treatment and cover with plastic wrap 30 g 2   mupirocin ointment (BACTROBAN) 2 % Apply 1 application topically 2 (two) times daily. 22 g 6   NONFORMULARY OR COMPOUNDED ITEM Baclofn/amitrp/ketamn cream topically to feet TID prn for neuropathy     NUBEQA 300 MG tablet Take 600 mg by mouth 2 (two) times daily.     ondansetron (ZOFRAN) 4 MG tablet Take 1 tablet (4 mg total) by mouth every 8 (eight) hours as needed. 20 tablet 0   oxybutynin (DITROPAN) 5 MG tablet Take 5 mg by mouth 2 (two) times daily.     oxyCODONE (OXY IR/ROXICODONE) 5 MG immediate release tablet TK 1 T PO Q 4 H PRN P  0   pantoprazole (PROTONIX) 40 MG tablet TAKE 1 TABLET BY MOUTH EVERY DAY 90 tablet 3   Current Facility-Administered Medications  Medication Dose Route Frequency Provider Last Rate Last Admin   sodium chloride flush (NS) 0.9 % injection 10 mL  10 mL Intracatheter PRN Alvy Bimler, Charlyne Robertshaw, MD   10 mL at 06/20/21 1232    SUMMARY OF ONCOLOGIC HISTORY: Oncology History  Malignant neoplasm of prostate ([place])  02/23/2007 Initial Diagnosis   Malignant neoplasm of prostate ([place])   10/28/2020 Cancer Staging   Staging form: Prostate, AJCC 6th Edition - Clinical: Stage III (T3, N0, M0) - Signed by Heath Lark, MD on 10/28/2020      PHYSICAL EXAMINATION: ECOG PERFORMANCE STATUS: 1 - Symptomatic but completely ambulatory  Vitals:   06/20/21 1207  BP: (!) 149/74  Pulse: 77  Resp: 16  Temp: 97.7 F (36.5 C)  SpO2: 98%   Filed Weights   06/20/21 1207  Weight: 187 lb 12.8 oz (85.2 kg)    GENERAL:alert, no distress and comfortable NEURO: alert & oriented x 3 with fluent speech, no focal motor/sensory deficits  LABORATORY DATA:  I have reviewed the data as listed    Component Value Date/Time   NA 140 06/20/2021 1147   NA 139 05/14/2017 1145   K 4.2 06/20/2021 1147   K 3.8 05/14/2017 1145   CL 109 06/20/2021 1147   CL 108 (H) 09/30/2013 0945   CL 110 (H)  12/29/2012 1151   CO2 23 06/20/2021 1147   CO2 27 05/14/2017 1145   GLUCOSE 111 (H) 06/20/2021 1147   GLUCOSE 109 05/14/2017 1145   GLUCOSE 127 (H) 12/29/2012 1151   BUN 29 (H) 06/20/2021 1147   BUN 17.9 05/14/2017 1145   CREATININE 1.39 (H) 06/20/2021 1147   CREATININE 1.35 (H) 05/09/2021 1122   CREATININE 1.3 05/14/2017 1145   CALCIUM 8.9 06/20/2021 1147   CALCIUM 9.3 05/14/2017 1145   PROT 6.9 06/20/2021 1147   PROT 6.2 05/14/2017 1145   PROT 6.6 05/14/2017 1145   ALBUMIN 3.4 (L) 06/20/2021 1147   ALBUMIN 3.3 (L) 05/14/2017 1145   AST 21 06/20/2021 1147   AST 22 05/09/2021 1122   AST 19 05/14/2017 1145   ALT 12 06/20/2021 1147   ALT 12 05/09/2021 1122   ALT 18 05/14/2017 1145   ALKPHOS 75 06/20/2021 1147   ALKPHOS 45 05/14/2017 1145   BILITOT 0.7 06/20/2021 1147   BILITOT 0.7 05/09/2021 1122   BILITOT 0.67 05/14/2017 1145   GFRNONAA 53 (L) 06/20/2021 1147   GFRNONAA 55 (L) 05/09/2021 1122   GFRNONAA >60 09/30/2013 0945  GFRAA 59 (L) 04/05/2020 1031   GFRAA >60 09/30/2013 0945    No results found for: SPEP, UPEP  Lab Results  Component Value Date   WBC 5.7 06/20/2021   NEUTROABS 3.4 06/20/2021   HGB 11.6 (L) 06/20/2021   HCT 34.3 (L) 06/20/2021   MCV 102.1 (H) 06/20/2021   PLT 153 06/20/2021      Chemistry      Component Value Date/Time   NA 140 06/20/2021 1147   NA 139 05/14/2017 1145   K 4.2 06/20/2021 1147   K 3.8 05/14/2017 1145   CL 109 06/20/2021 1147   CL 108 (H) 09/30/2013 0945   CL 110 (H) 12/29/2012 1151   CO2 23 06/20/2021 1147   CO2 27 05/14/2017 1145   BUN 29 (H) 06/20/2021 1147   BUN 17.9 05/14/2017 1145   CREATININE 1.39 (H) 06/20/2021 1147   CREATININE 1.35 (H) 05/09/2021 1122   CREATININE 1.3 05/14/2017 1145      Component Value Date/Time   CALCIUM 8.9 06/20/2021 1147   CALCIUM 9.3 05/14/2017 1145   ALKPHOS 75 06/20/2021 1147   ALKPHOS 45 05/14/2017 1145   AST 21 06/20/2021 1147   AST 22 05/09/2021 1122   AST 19 05/14/2017  1145   ALT 12 06/20/2021 1147   ALT 12 05/09/2021 1122   ALT 18 05/14/2017 1145   BILITOT 0.7 06/20/2021 1147   BILITOT 0.7 05/09/2021 1122   BILITOT 0.67 05/14/2017 1145

## 2021-06-20 NOTE — Progress Notes (Signed)
Patient's port deaccessed per MD request. Patient will not require infusion today. Patient and infusion aware.  Pt discharged in stable condition.

## 2021-06-20 NOTE — Assessment & Plan Note (Signed)
I would defer management to his dermatologist

## 2021-06-23 LAB — KAPPA/LAMBDA LIGHT CHAINS
Kappa free light chain: 46.4 mg/L — ABNORMAL HIGH (ref 3.3–19.4)
Kappa, lambda light chain ratio: 1.71 — ABNORMAL HIGH (ref 0.26–1.65)
Lambda free light chains: 27.2 mg/L — ABNORMAL HIGH (ref 5.7–26.3)

## 2021-06-24 LAB — MULTIPLE MYELOMA PANEL, SERUM
Albumin SerPl Elph-Mcnc: 3.2 g/dL (ref 2.9–4.4)
Albumin/Glob SerPl: 1.1 (ref 0.7–1.7)
Alpha 1: 0.2 g/dL (ref 0.0–0.4)
Alpha2 Glob SerPl Elph-Mcnc: 0.8 g/dL (ref 0.4–1.0)
B-Globulin SerPl Elph-Mcnc: 0.7 g/dL (ref 0.7–1.3)
Gamma Glob SerPl Elph-Mcnc: 1.3 g/dL (ref 0.4–1.8)
Globulin, Total: 3 g/dL (ref 2.2–3.9)
IgA: 221 mg/dL (ref 61–437)
IgG (Immunoglobin G), Serum: 1340 mg/dL (ref 603–1613)
IgM (Immunoglobulin M), Srm: 27 mg/dL (ref 15–143)
Total Protein ELP: 6.2 g/dL (ref 6.0–8.5)

## 2021-06-25 ENCOUNTER — Telehealth: Payer: Self-pay | Admitting: *Deleted

## 2021-06-25 NOTE — Telephone Encounter (Signed)
-----   Message from Heath Lark, MD sent at 06/25/2021  9:26 AM EST ----- Regarding: results Pls call and let him know myeloma panel is stable/in remission

## 2021-06-25 NOTE — Telephone Encounter (Signed)
Per Dr.Gorsuch, called pt with message below. Pt verbalized understanding and was appreciative.

## 2021-06-30 ENCOUNTER — Telehealth: Payer: Self-pay | Admitting: Podiatry

## 2021-06-30 NOTE — Telephone Encounter (Signed)
Patient called back after I left him a message. I relayed message to pt that we can schedule him but insurance wouldn't cover the orthotics . Patient wanted next available appt with Dr Milinda Pointer because he states he is having some pain and he needs the orthotics. Scheduled pt next available appt.

## 2021-06-30 NOTE — Telephone Encounter (Signed)
Called pt LM on VM for pt to call the office to schedule appt if he is still wanting the orthotics.

## 2021-06-30 NOTE — Telephone Encounter (Signed)
Patient called stating he had surgery and is having some pain. Patient states he thinks he needs orthotics. Patient wanted to know if you could order them and [place] schedule him an appointment. Patients number is [phone].

## 2021-06-30 NOTE — Telephone Encounter (Signed)
You can schedule him an appt with Saafir, but let him know they are not covered by his insurance.

## 2021-07-08 NOTE — Telephone Encounter (Signed)
Scheduled pt 1.16.2022 when [name] is here in orthotics.

## 2021-07-09 ENCOUNTER — Other Ambulatory Visit: Payer: Medicare Other

## 2021-07-23 ENCOUNTER — Encounter: Payer: Self-pay | Admitting: Podiatry

## 2021-07-23 ENCOUNTER — Ambulatory Visit: Payer: Medicare Other | Admitting: Podiatry

## 2021-07-23 ENCOUNTER — Other Ambulatory Visit: Payer: Self-pay

## 2021-07-23 ENCOUNTER — Ambulatory Visit (INDEPENDENT_AMBULATORY_CARE_PROVIDER_SITE_OTHER): Payer: Medicare Other | Admitting: Podiatry

## 2021-07-23 DIAGNOSIS — L97521 Non-pressure chronic ulcer of other part of left foot limited to breakdown of skin: Secondary | ICD-10-CM

## 2021-07-23 MED ORDER — AMOXICILLIN 500 MG PO CAPS: 500.0000 mg | ORAL_CAPSULE | Freq: Three times a day (TID) | ORAL | 0 refills | Status: AC

## 2021-07-24 NOTE — Progress Notes (Signed)
He presents today concerned about the fourth toe of the left foot.  States that it was red and swollen for about a week.  Objective: Vital signs are stable he is alert oriented x3 he has multiple amputations with the fourth and fifth digits remaining in this foot.  At this point it appears that the toenail is growing long and curving into the toe he has broken the skin most likely due to jamming of the end of the shoe.  He has not seen Emilio yet for a shoe filler.  The toe is red swollen at the end the nail is incurvated and has broken the skin at this point I debrided the nail and debrided the tissue does not demonstrate any purulence or malodor just mild erythema.  Assessment: Digital infection secondary to the nail breaking the skin.  Plan: At this point we will start back on amoxicillin 500 mg 3 times a day and daily dressing changes and I will follow-up with him on an [place] on the 16th.

## 2021-07-31 ENCOUNTER — Telehealth: Payer: Self-pay

## 2021-07-31 NOTE — Telephone Encounter (Signed)
Returned his call. He has a sore throat and wants to move port/lab flush appt on 1/6 to 1/13. Appt moved and he is aware of appt.

## 2021-08-01 ENCOUNTER — Inpatient Hospital Stay: Payer: Medicare Other

## 2021-08-08 ENCOUNTER — Other Ambulatory Visit: Payer: Self-pay

## 2021-08-08 ENCOUNTER — Inpatient Hospital Stay: Payer: Medicare Other | Attending: Hematology and Oncology

## 2021-08-08 DIAGNOSIS — Z95828 Presence of other vascular implants and grafts: Secondary | ICD-10-CM

## 2021-08-08 DIAGNOSIS — C9001 Multiple myeloma in remission: Secondary | ICD-10-CM | POA: Insufficient documentation

## 2021-08-08 LAB — CBC WITH DIFFERENTIAL/PLATELET
Abs Immature Granulocytes: 0.03 10*3/uL (ref 0.00–0.07)
Basophils Absolute: 0 10*3/uL (ref 0.0–0.1)
Basophils Relative: 0 %
Eosinophils Absolute: 0.3 10*3/uL (ref 0.0–0.5)
Eosinophils Relative: 4 %
HCT: 35.6 % — ABNORMAL LOW (ref 39.0–52.0)
Hemoglobin: 12.1 g/dL — ABNORMAL LOW (ref 13.0–17.0)
Immature Granulocytes: 0 %
Lymphocytes Relative: 19 %
Lymphs Abs: 1.4 10*3/uL (ref 0.7–4.0)
MCH: 34 pg (ref 26.0–34.0)
MCHC: 34 g/dL (ref 30.0–36.0)
MCV: 100 fL (ref 80.0–100.0)
Monocytes Absolute: 0.9 10*3/uL (ref 0.1–1.0)
Monocytes Relative: 13 %
Neutro Abs: 4.4 10*3/uL (ref 1.7–7.7)
Neutrophils Relative %: 64 %
Platelets: 162 10*3/uL (ref 150–400)
RBC: 3.56 MIL/uL — ABNORMAL LOW (ref 4.22–5.81)
RDW: 12.6 % (ref 11.5–15.5)
WBC: 7.1 10*3/uL (ref 4.0–10.5)
nRBC: 0 % (ref 0.0–0.2)

## 2021-08-08 LAB — COMPREHENSIVE METABOLIC PANEL
ALT: 14 U/L (ref 0–44)
AST: 23 U/L (ref 15–41)
Albumin: 3.6 g/dL (ref 3.5–5.0)
Alkaline Phosphatase: 65 U/L (ref 38–126)
Anion gap: 7 (ref 5–15)
BUN: 21 mg/dL (ref 8–23)
CO2: 26 mmol/L (ref 22–32)
Calcium: 9 mg/dL (ref 8.9–10.3)
Chloride: 106 mmol/L (ref 98–111)
Creatinine, Ser: 1.25 mg/dL — ABNORMAL HIGH (ref 0.61–1.24)
GFR, Estimated: >60 mL/min (ref >60)
Glucose, Bld: 100 mg/dL — ABNORMAL HIGH (ref 70–99)
Potassium: 4 mmol/L (ref 3.5–5.1)
Sodium: 139 mmol/L (ref 135–145)
Total Bilirubin: 0.8 mg/dL (ref 0.3–1.2)
Total Protein: 6.7 g/dL (ref 6.5–8.1)

## 2021-08-08 MED ORDER — SODIUM CHLORIDE 0.9% FLUSH
10.0000 mL | INTRAVENOUS | Status: DC | PRN
  Administered 2021-08-08: 10 mL

## 2021-08-08 MED ORDER — HEPARIN SOD (PORK) LOCK FLUSH 100 UNIT/ML IV SOLN
500.0000 [IU] | Freq: Once | INTRAVENOUS | Status: AC | PRN
  Administered 2021-08-08: 500 [IU] via INTRAVENOUS

## 2021-08-11 ENCOUNTER — Ambulatory Visit (INDEPENDENT_AMBULATORY_CARE_PROVIDER_SITE_OTHER): Payer: Medicare Other | Admitting: Podiatry

## 2021-08-11 ENCOUNTER — Encounter: Payer: Self-pay | Admitting: Podiatry

## 2021-08-11 ENCOUNTER — Other Ambulatory Visit: Payer: Self-pay

## 2021-08-11 ENCOUNTER — Other Ambulatory Visit: Payer: Medicare Other

## 2021-08-11 DIAGNOSIS — L97521 Non-pressure chronic ulcer of other part of left foot limited to breakdown of skin: Secondary | ICD-10-CM

## 2021-08-11 LAB — KAPPA/LAMBDA LIGHT CHAINS
Kappa free light chain: 49.5 mg/L — ABNORMAL HIGH (ref 3.3–19.4)
Kappa, lambda light chain ratio: 1.54 (ref 0.26–1.65)
Lambda free light chains: 32.2 mg/L — ABNORMAL HIGH (ref 5.7–26.3)

## 2021-08-11 MED ORDER — AMOXICILLIN 500 MG PO CAPS: 500.0000 mg | ORAL_CAPSULE | Freq: Three times a day (TID) | ORAL | 0 refills | Status: AC

## 2021-08-11 NOTE — Progress Notes (Signed)
He presents today states that the toe continues to bother him as he refers to the fourth digit of the left foot.  He states that I had rather just get rid of all of the toes because it was remaining is painful.  Objective: Vital signs are stable alert oriented x3.  Pulses are palpable.  There is no edema to some mild erythema he still has an ulceration to the distal aspect of the fourth digit left foot with some mild erythema to the toe.  No purulence no malodor but a small granuloma was noted.  This was debrided today with considerable bleeding.  Assessment: Painful forefoot left status post amputations and chronic ulceration fourth digit left foot.  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  He understands this and is amendable to it.  We consented him today and discussed possible postop complications which may include but not limited to postop pain bleeding swelling infection recurrence need for further surgery overcorrection under correction loss of digit loss of limb loss of life he understands this is amendable to it was signed all 3 pages consent form.  I will follow-up with him at the time of surgery.

## 2021-08-12 NOTE — Addendum Note (Signed)
Addended by: Clovis Riley E on: 08/12/2021 11:41 AM   Modules accepted: Level of Service

## 2021-08-13 ENCOUNTER — Other Ambulatory Visit: Payer: Self-pay | Admitting: Podiatry

## 2021-08-13 ENCOUNTER — Encounter: Payer: Medicare Other | Admitting: Podiatry

## 2021-08-14 ENCOUNTER — Telehealth: Payer: Self-pay

## 2021-08-14 LAB — MULTIPLE MYELOMA PANEL, SERUM
Albumin SerPl Elph-Mcnc: 3.3 g/dL (ref 2.9–4.4)
Albumin/Glob SerPl: 1.2 (ref 0.7–1.7)
Alpha 1: 0.2 g/dL (ref 0.0–0.4)
Alpha2 Glob SerPl Elph-Mcnc: 0.8 g/dL (ref 0.4–1.0)
B-Globulin SerPl Elph-Mcnc: 0.8 g/dL (ref 0.7–1.3)
Gamma Glob SerPl Elph-Mcnc: 1.2 g/dL (ref 0.4–1.8)
Globulin, Total: 3 g/dL (ref 2.2–3.9)
IgA: 194 mg/dL (ref 61–437)
IgG (Immunoglobin G), Serum: 1285 mg/dL (ref 603–1613)
IgM (Immunoglobulin M), Srm: 36 mg/dL (ref 15–143)
Total Protein ELP: 6.3 g/dL (ref 6.0–8.5)

## 2021-08-14 NOTE — Telephone Encounter (Signed)
Called and given below message. He verbalized understanding. He appreciated the call.

## 2021-08-14 NOTE — Telephone Encounter (Signed)
-----   Message from Heath Lark, MD sent at 08/14/2021  1:58 PM EST ----- Regarding: myeloma panel Pls let him know myeloma panel is good

## 2021-08-15 DIAGNOSIS — L97521 Non-pressure chronic ulcer of other part of left foot limited to breakdown of skin: Secondary | ICD-10-CM | POA: Diagnosis not present

## 2021-08-20 ENCOUNTER — Other Ambulatory Visit: Payer: Self-pay

## 2021-08-20 ENCOUNTER — Ambulatory Visit (INDEPENDENT_AMBULATORY_CARE_PROVIDER_SITE_OTHER): Payer: Medicare Other | Admitting: Podiatry

## 2021-08-20 ENCOUNTER — Encounter: Payer: Self-pay | Admitting: Podiatry

## 2021-08-20 ENCOUNTER — Encounter: Payer: Medicare Other | Admitting: Podiatry

## 2021-08-20 DIAGNOSIS — Z89412 Acquired absence of left great toe: Secondary | ICD-10-CM

## 2021-08-20 DIAGNOSIS — L97521 Non-pressure chronic ulcer of other part of left foot limited to breakdown of skin: Secondary | ICD-10-CM

## 2021-08-20 MED ORDER — CLINDAMYCIN HCL 150 MG PO CAPS: 150.0000 mg | ORAL_CAPSULE | Freq: Three times a day (TID) | ORAL | 1 refills | Status: DC

## 2021-08-21 NOTE — Progress Notes (Signed)
He presents today for his first postop visit he is status post transmetatarsal amputation left foot.  Denies fever chills nausea vomiting muscle aches and pains states that he has not been wearing the boot at home he walked the bandage off of the foot and then put a light bandage on it with Aquaphor ointment over the incision site.  He denies any pain in the leg.  Objective: Once the dressing was removed that we did not apply was noticed that his foot is red and swollen as is his leg.  Vitals are stable he has no signs of systemic infection.  He has been taking his amoxicillin as requested.  Radiographs taken today do not demonstrate any type of osseous abnormalities other than transmetatarsal amputation no signs of gas in the tissues.  Assessment: Status post transmetatarsal amputation of the left foot I am concerned about the swelling and the cellulitic appearance of his left leg I recommended that we change his antibiotic to clindamycin I redressed the foot encouraged him to stay in the cam boot.  Should he develop a fever disorientation not feel well he is to immediately go to the hospital.

## 2021-08-29 ENCOUNTER — Ambulatory Visit (INDEPENDENT_AMBULATORY_CARE_PROVIDER_SITE_OTHER): Payer: Medicare Other | Admitting: Podiatry

## 2021-08-29 ENCOUNTER — Encounter: Payer: Self-pay | Admitting: Podiatry

## 2021-08-29 ENCOUNTER — Other Ambulatory Visit: Payer: Self-pay

## 2021-08-29 DIAGNOSIS — Z9889 Other specified postprocedural states: Secondary | ICD-10-CM

## 2021-08-29 NOTE — Progress Notes (Signed)
° °  Subjective:  Patient presents today status post left transmetatarsal amputation DOS: 08/15/2021.  Patient states that they are doing very well.  Patient states that they took the antibiotic as prescribed.  He has been staying in the cam boot.  He does not like the cam boot and would like to transition into a poor surgical shoe..  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  Skin incision appears to be well coapted with sutures and staples intact. No sign of infectious process noted. No dehiscence. No active bleeding noted.  Overall this is a well healing surgical amputation foot  Assessment: 1. s/p transmetatarsal amputation left. DOS: 08/15/2021   Plan of Care:  1. Patient was evaluated. 2.  Dressings changed today.  Patient is now 2 weeks postop.  I would like to leave the sutures and staples intact for 1 additional week.  Keep dressings clean dry and intact x1 week 3.  The patient may now transition out of the cam boot into a post surgical shoe.  Patient states that he has already been wearing the postsurgical shoe around the house anyways.  There has been no detriment to the foot based on exam today 4.   Return to clinic in 1 week for possible suture and staple removal pending reevaluation with Dr. Mickeal Needy, DPM [facility]  Dr. Edrick Kins, DPM    [address], [place] [address]                Office [phone]  Fax [phone]

## 2021-09-10 ENCOUNTER — Ambulatory Visit (INDEPENDENT_AMBULATORY_CARE_PROVIDER_SITE_OTHER): Payer: Medicare Other | Admitting: Podiatry

## 2021-09-10 ENCOUNTER — Encounter: Payer: Self-pay | Admitting: Podiatry

## 2021-09-10 ENCOUNTER — Other Ambulatory Visit: Payer: Self-pay

## 2021-09-10 DIAGNOSIS — M79676 Pain in unspecified toe(s): Secondary | ICD-10-CM | POA: Diagnosis not present

## 2021-09-10 DIAGNOSIS — L97521 Non-pressure chronic ulcer of other part of left foot limited to breakdown of skin: Secondary | ICD-10-CM

## 2021-09-10 DIAGNOSIS — Z9889 Other specified postprocedural states: Secondary | ICD-10-CM | POA: Diagnosis not present

## 2021-09-10 DIAGNOSIS — B351 Tinea unguium: Secondary | ICD-10-CM | POA: Diagnosis not present

## 2021-09-10 NOTE — Progress Notes (Signed)
He presents today date of surgery 08/15/2021 transmetatarsal amputation left foot states that his been doing just fine and ready to get out of the shoe and back into my regular tennis shoe.  Objective: Vital signs are stable alert oriented x3.  There is no erythema it is some mild edema no cellulitis drainage or odor sutures and staples are intact to the transmet left however he does have an area of discoloration to the anterior most aspect which appears to have been a superficial infection that has gone on to heal.  However I do not want to remove the sutures in this area inferior of the wound opening up.  Assessment: Well-healing surgical foot.  Plan: I removed sutures and staples along the medial lateral aspect of the incision site and this appears to be well coapted I did redress the foot today dressed a compressive dressing and told him to try to stay off it is much as possible continue the use of the [place] shoe I will follow-up with him in 1 week at which time we hope to be able to remove the remainder of the staples

## 2021-09-17 ENCOUNTER — Other Ambulatory Visit: Payer: Self-pay

## 2021-09-17 ENCOUNTER — Ambulatory Visit (INDEPENDENT_AMBULATORY_CARE_PROVIDER_SITE_OTHER): Payer: Medicare Other | Admitting: Podiatry

## 2021-09-17 ENCOUNTER — Encounter: Payer: Self-pay | Admitting: Podiatry

## 2021-09-17 DIAGNOSIS — L97521 Non-pressure chronic ulcer of other part of left foot limited to breakdown of skin: Secondary | ICD-10-CM

## 2021-09-17 DIAGNOSIS — Z9889 Other specified postprocedural states: Secondary | ICD-10-CM

## 2021-09-17 NOTE — Progress Notes (Signed)
He presents today for his thumb transmetatarsal amputation date of surgery 08/15/2021 so its been a month now and he states that he seems to be doing pretty well with it.  Denies any drainage or any pain.  Objective: Dressed her dressing intact once removed demonstrates no erythema edema cellulitis drainage or odor I removed remainder of the stitches.  His margins remain closed.  Will allow him to start washing this and cleaning it.  He is not to soak it.  He understands this and he understands not to pick at it.  He can put lotion on it.  I did put him in a Surgigrip compression to help mold the front of the foot and we will continue to utilize this.  Assessment: Well-healing transmet amputation left foot.  Plan: I Georgina Peer let him back into his regular shoes as long as he can use his compression dressing.  We will follow-up with him in a few weeks to consider a toe filler.

## 2021-09-22 ENCOUNTER — Other Ambulatory Visit: Payer: Self-pay

## 2021-09-22 ENCOUNTER — Inpatient Hospital Stay (HOSPITAL_BASED_OUTPATIENT_CLINIC_OR_DEPARTMENT_OTHER): Payer: Medicare Other | Admitting: Hematology and Oncology

## 2021-09-22 ENCOUNTER — Telehealth: Payer: Self-pay | Admitting: Family Medicine

## 2021-09-22 ENCOUNTER — Inpatient Hospital Stay: Payer: Medicare Other | Attending: Hematology and Oncology

## 2021-09-22 ENCOUNTER — Encounter: Payer: Medicare Other | Admitting: Podiatry

## 2021-09-22 DIAGNOSIS — C9001 Multiple myeloma in remission: Secondary | ICD-10-CM | POA: Diagnosis present

## 2021-09-22 DIAGNOSIS — N183 Chronic kidney disease, stage 3 unspecified: Secondary | ICD-10-CM | POA: Diagnosis not present

## 2021-09-22 DIAGNOSIS — Z79899 Other long term (current) drug therapy: Secondary | ICD-10-CM | POA: Insufficient documentation

## 2021-09-22 DIAGNOSIS — C61 Malignant neoplasm of prostate: Secondary | ICD-10-CM | POA: Insufficient documentation

## 2021-09-22 DIAGNOSIS — Z95828 Presence of other vascular implants and grafts: Secondary | ICD-10-CM

## 2021-09-22 LAB — CBC WITH DIFFERENTIAL/PLATELET
Abs Immature Granulocytes: 0.02 10*3/uL (ref 0.00–0.07)
Basophils Absolute: 0 10*3/uL (ref 0.0–0.1)
Basophils Relative: 1 %
Eosinophils Absolute: 0.1 10*3/uL (ref 0.0–0.5)
Eosinophils Relative: 2 %
HCT: 37.4 % — ABNORMAL LOW (ref 39.0–52.0)
Hemoglobin: 12.6 g/dL — ABNORMAL LOW (ref 13.0–17.0)
Immature Granulocytes: 0 %
Lymphocytes Relative: 19 %
Lymphs Abs: 1.4 10*3/uL (ref 0.7–4.0)
MCH: 33.5 pg (ref 26.0–34.0)
MCHC: 33.7 g/dL (ref 30.0–36.0)
MCV: 99.5 fL (ref 80.0–100.0)
Monocytes Absolute: 1 10*3/uL (ref 0.1–1.0)
Monocytes Relative: 13 %
Neutro Abs: 4.9 10*3/uL (ref 1.7–7.7)
Neutrophils Relative %: 65 %
Platelets: 215 10*3/uL (ref 150–400)
RBC: 3.76 MIL/uL — ABNORMAL LOW (ref 4.22–5.81)
RDW: 12.6 % (ref 11.5–15.5)
WBC: 7.5 10*3/uL (ref 4.0–10.5)
nRBC: 0 % (ref 0.0–0.2)

## 2021-09-22 LAB — COMPREHENSIVE METABOLIC PANEL
ALT: 11 U/L (ref 0–44)
AST: 18 U/L (ref 15–41)
Albumin: 3.8 g/dL (ref 3.5–5.0)
Alkaline Phosphatase: 53 U/L (ref 38–126)
Anion gap: 6 (ref 5–15)
BUN: 24 mg/dL — ABNORMAL HIGH (ref 8–23)
CO2: 27 mmol/L (ref 22–32)
Calcium: 9.5 mg/dL (ref 8.9–10.3)
Chloride: 104 mmol/L (ref 98–111)
Creatinine, Ser: 1.62 mg/dL — ABNORMAL HIGH (ref 0.61–1.24)
GFR, Estimated: 44 mL/min — ABNORMAL LOW (ref >60)
Glucose, Bld: 102 mg/dL — ABNORMAL HIGH (ref 70–99)
Potassium: 4.1 mmol/L (ref 3.5–5.1)
Sodium: 137 mmol/L (ref 135–145)
Total Bilirubin: 0.8 mg/dL (ref 0.3–1.2)
Total Protein: 7.3 g/dL (ref 6.5–8.1)

## 2021-09-22 MED ORDER — HEPARIN SOD (PORK) LOCK FLUSH 100 UNIT/ML IV SOLN
500.0000 [IU] | Freq: Once | INTRAVENOUS | Status: AC | PRN
  Administered 2021-09-22: 500 [IU] via INTRAVENOUS

## 2021-09-22 MED ORDER — SODIUM CHLORIDE 0.9% FLUSH
10.0000 mL | INTRAVENOUS | Status: DC | PRN
  Administered 2021-09-22: 10 mL

## 2021-09-22 NOTE — Telephone Encounter (Signed)
Last seen in 2017... needs to have new patient OV note. Make sure no more than 1 new patient scheduled per week.

## 2021-09-22 NOTE — Telephone Encounter (Signed)
MR. Tietje CALLED IN AND WANTED TO KNOW IF DR. Diona Browner WILL SEE HIM AGAIN

## 2021-09-23 ENCOUNTER — Encounter: Payer: Self-pay | Admitting: Hematology and Oncology

## 2021-09-23 LAB — KAPPA/LAMBDA LIGHT CHAINS
Kappa free light chain: 47.6 mg/L — ABNORMAL HIGH (ref 3.3–19.4)
Kappa, lambda light chain ratio: 1.54 (ref 0.26–1.65)
Lambda free light chains: 30.9 mg/L — ABNORMAL HIGH (ref 5.7–26.3)

## 2021-09-23 NOTE — Assessment & Plan Note (Signed)
He is known to have intermittent elevated serum creatinine This is not due to myeloma We discussed importance of adequate hydration

## 2021-09-23 NOTE — Assessment & Plan Note (Signed)
According to the patient, his PSA was recently elevated I would defer to urologist for management

## 2021-09-23 NOTE — Progress Notes (Signed)
[facility] OFFICE PROGRESS NOTE  Patient Care Team: Jinny Sanders, MD as PCP - General Jeanann Lewandowsky, MD as Consulting Physician (Hematology and Oncology) Cathlean Marseilles, NP as Nurse Practitioner (Hematology and Oncology) Raynelle Bring, MD as Consulting Physician (Urology) Heath Lark, MD as Consulting Physician (Hematology and Oncology) [place], Romilda Garret, [place] as Consulting Physician (Podiatry) Morton Stall, Howell Rucks, NP as Nurse Practitioner (Pain Medicine) Druscilla Brownie, MD as Consulting Physician (Dermatology) Bryson Ha, OD as Consulting Physician (Optometry)  ASSESSMENT & PLAN:  Multiple myeloma in remission [facility]) Myeloma results are pending I will call him with result next visit The patient has been on Zometa for over 10 years and I think it is also time to discontinue to reduce risk of pathological fracture and osteonecrosis of the jaw He will continue calcium and vitamin D only and we can consider Zometa once a year  Malignant neoplasm of prostate [facility]) According to the patient, his PSA was recently elevated I would defer to urologist for management  Chronic kidney disease, stage III (moderate) He is known to have intermittent elevated serum creatinine This is not due to myeloma We discussed importance of adequate hydration  No orders of the defined types were placed in this encounter.   All questions were answered. The patient knows to call the clinic with any problems, questions or concerns. The total time spent in the appointment was 20 minutes encounter with patients including review of chart and various tests results, discussions about plan of care and coordination of care plan   Heath Lark, MD 09/23/2021 9:21 AM  INTERVAL HISTORY: Please see below for problem oriented charting. he returns for treatment follow-up with his significant other No recent infection, fever or chills His chronic pain is stable He was told that recent PSA was  elevated  REVIEW OF SYSTEMS:   Constitutional: Denies fevers, chills or abnormal weight loss Eyes: Denies blurriness of vision Ears, nose, mouth, throat, and face: Denies mucositis or sore throat Respiratory: Denies cough, dyspnea or wheezes Cardiovascular: Denies palpitation, chest discomfort or lower extremity swelling Gastrointestinal:  Denies nausea, heartburn or change in bowel habits Skin: Denies abnormal skin rashes Lymphatics: Denies new lymphadenopathy or easy bruising Neurological:Denies numbness, tingling or new weaknesses Behavioral/Psych: Mood is stable, no new changes  All other systems were reviewed with the patient and are negative.  I have reviewed the past medical history, past surgical history, social history and family history with the patient and they are unchanged from previous note.  ALLERGIES:  is allergic to celebrex [celecoxib], cymbalta [duloxetine hcl], hydromorphone hcl, meloxicam, oxycontin [oxycodone], percocet [oxycodone-acetaminophen], vicodin [hydrocodone-acetaminophen], and lyrica [pregabalin].  MEDICATIONS:  Current Outpatient Medications  Medication Sig Dispense Refill   aspirin 81 MG tablet Take 81 mg by mouth daily.     B Complex Vitamins (VITAMIN-B COMPLEX) TABS Take by mouth.     Calcium Carbonate-Vitamin D 600-400 MG-UNIT per tablet Take by mouth.     cholecalciferol (VITAMIN D) 1000 UNITS tablet Take 1,000 Units by mouth daily.     diclofenac Sodium (VOLTAREN) 1 % GEL Apply 4 g topically 4 (four) times daily. 150 g 1   fentaNYL (DURAGESIC) 75 MCG/HR APP 1 PA EXT TO THE SKIN QOD     lidocaine-prilocaine (EMLA) cream Apply topically as needed. Apply to port site one hour before treatment and cover with plastic wrap 30 g 2   mupirocin ointment (BACTROBAN) 2 % Apply 1 application topically 2 (two) times daily. 22 g 6  NONFORMULARY OR COMPOUNDED ITEM Baclofn/amitrp/ketamn cream topically to feet TID prn for neuropathy     NUBEQA 300 MG tablet Take  600 mg by mouth 2 (two) times daily.     oxyCODONE (OXY IR/ROXICODONE) 5 MG immediate release tablet TK 1 T PO Q 4 H PRN P  0   pantoprazole (PROTONIX) 40 MG tablet TAKE 1 TABLET BY MOUTH EVERY DAY 90 tablet 3   No current facility-administered medications for this visit.    SUMMARY OF ONCOLOGIC HISTORY: Oncology History  Malignant neoplasm of prostate ([place])  02/23/2007 Initial Diagnosis   Malignant neoplasm of prostate ([place])   10/28/2020 Cancer Staging   Staging form: Prostate, AJCC 6th Edition - Clinical: Stage III (T3, N0, M0) - Signed by Heath Lark, MD on 10/28/2020      PHYSICAL EXAMINATION: ECOG PERFORMANCE STATUS: 1 - Symptomatic but completely ambulatory  Vitals:   09/22/21 1243  BP: 127/72  Pulse: 78  Resp: 18  Temp: 98.1 F (36.7 C)  SpO2: 100%   Filed Weights   09/22/21 1243  Weight: 187 lb 3.2 oz (84.9 kg)    GENERAL:alert, no distress and comfortable NEURO: alert & oriented x 3 with fluent speech, no focal motor/sensory deficits  LABORATORY DATA:  I have reviewed the data as listed    Component Value Date/Time   NA 137 09/22/2021 1157   NA 139 05/14/2017 1145   K 4.1 09/22/2021 1157   K 3.8 05/14/2017 1145   CL 104 09/22/2021 1157   CL 108 (H) 09/30/2013 0945   CL 110 (H) 12/29/2012 1151   CO2 27 09/22/2021 1157   CO2 27 05/14/2017 1145   GLUCOSE 102 (H) 09/22/2021 1157   GLUCOSE 109 05/14/2017 1145   GLUCOSE 127 (H) 12/29/2012 1151   BUN 24 (H) 09/22/2021 1157   BUN 17.9 05/14/2017 1145   CREATININE 1.62 (H) 09/22/2021 1157   CREATININE 1.35 (H) 05/09/2021 1122   CREATININE 1.3 05/14/2017 1145   CALCIUM 9.5 09/22/2021 1157   CALCIUM 9.3 05/14/2017 1145   PROT 7.3 09/22/2021 1157   PROT 6.2 05/14/2017 1145   PROT 6.6 05/14/2017 1145   ALBUMIN 3.8 09/22/2021 1157   ALBUMIN 3.3 (L) 05/14/2017 1145   AST 18 09/22/2021 1157   AST 22 05/09/2021 1122   AST 19 05/14/2017 1145   ALT 11 09/22/2021 1157   ALT 12 05/09/2021 1122   ALT 18  05/14/2017 1145   ALKPHOS 53 09/22/2021 1157   ALKPHOS 45 05/14/2017 1145   BILITOT 0.8 09/22/2021 1157   BILITOT 0.7 05/09/2021 1122   BILITOT 0.67 05/14/2017 1145   GFRNONAA 44 (L) 09/22/2021 1157   GFRNONAA 55 (L) 05/09/2021 1122   GFRNONAA >60 09/30/2013 0945   GFRAA 59 (L) 04/05/2020 1031   GFRAA >60 09/30/2013 0945    No results found for: SPEP, UPEP  Lab Results  Component Value Date   WBC 7.5 09/22/2021   NEUTROABS 4.9 09/22/2021   HGB 12.6 (L) 09/22/2021   HCT 37.4 (L) 09/22/2021   MCV 99.5 09/22/2021   PLT 215 09/22/2021      Chemistry      Component Value Date/Time   NA 137 09/22/2021 1157   NA 139 05/14/2017 1145   K 4.1 09/22/2021 1157   K 3.8 05/14/2017 1145   CL 104 09/22/2021 1157   CL 108 (H) 09/30/2013 0945   CL 110 (H) 12/29/2012 1151   CO2 27 09/22/2021 1157   CO2 27 05/14/2017 1145  BUN 24 (H) 09/22/2021 1157   BUN 17.9 05/14/2017 1145   CREATININE 1.62 (H) 09/22/2021 1157   CREATININE 1.35 (H) 05/09/2021 1122   CREATININE 1.3 05/14/2017 1145      Component Value Date/Time   CALCIUM 9.5 09/22/2021 1157   CALCIUM 9.3 05/14/2017 1145   ALKPHOS 53 09/22/2021 1157   ALKPHOS 45 05/14/2017 1145   AST 18 09/22/2021 1157   AST 22 05/09/2021 1122   AST 19 05/14/2017 1145   ALT 11 09/22/2021 1157   ALT 12 05/09/2021 1122   ALT 18 05/14/2017 1145   BILITOT 0.8 09/22/2021 1157   BILITOT 0.7 05/09/2021 1122   BILITOT 0.67 05/14/2017 1145

## 2021-09-23 NOTE — Progress Notes (Signed)
No critical labs need to be addressed urgently. We will discuss labs in detail at upcoming office visit.   

## 2021-09-23 NOTE — Assessment & Plan Note (Signed)
Myeloma results are pending I will call him with result next visit The patient has been on Zometa for over 10 years and I think it is also time to discontinue to reduce risk of pathological fracture and osteonecrosis of the jaw He will continue calcium and vitamin D only and we can consider Zometa once a year

## 2021-09-24 ENCOUNTER — Encounter: Payer: Medicare Other | Admitting: Podiatry

## 2021-09-24 LAB — MULTIPLE MYELOMA PANEL, SERUM
Albumin SerPl Elph-Mcnc: 3.5 g/dL (ref 2.9–4.4)
Albumin/Glob SerPl: 1.1 (ref 0.7–1.7)
Alpha 1: 0.3 g/dL (ref 0.0–0.4)
Alpha2 Glob SerPl Elph-Mcnc: 0.8 g/dL (ref 0.4–1.0)
B-Globulin SerPl Elph-Mcnc: 0.9 g/dL (ref 0.7–1.3)
Gamma Glob SerPl Elph-Mcnc: 1.2 g/dL (ref 0.4–1.8)
Globulin, Total: 3.2 g/dL (ref 2.2–3.9)
IgA: 217 mg/dL (ref 61–437)
IgG (Immunoglobin G), Serum: 1229 mg/dL (ref 603–1613)
IgM (Immunoglobulin M), Srm: 37 mg/dL (ref 15–143)
Total Protein ELP: 6.7 g/dL (ref 6.0–8.5)

## 2021-09-25 ENCOUNTER — Telehealth: Payer: Self-pay

## 2021-09-25 NOTE — Telephone Encounter (Signed)
Called and given below message. He verbalized understanding. 

## 2021-09-25 NOTE — Telephone Encounter (Signed)
-----   Message from Heath Lark, MD sent at 09/25/2021  8:17 AM EST ----- ?Let him know myeloma panel is good ? ?

## 2021-10-01 ENCOUNTER — Encounter: Payer: Self-pay | Admitting: Podiatry

## 2021-10-01 ENCOUNTER — Ambulatory Visit (INDEPENDENT_AMBULATORY_CARE_PROVIDER_SITE_OTHER): Payer: Medicare Other | Admitting: Podiatry

## 2021-10-01 ENCOUNTER — Other Ambulatory Visit: Payer: Self-pay

## 2021-10-01 DIAGNOSIS — Z9889 Other specified postprocedural states: Secondary | ICD-10-CM

## 2021-10-01 DIAGNOSIS — L97521 Non-pressure chronic ulcer of other part of left foot limited to breakdown of skin: Secondary | ICD-10-CM

## 2021-10-01 NOTE — Progress Notes (Signed)
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 ? ?Objective: Vital signs stable he is alert oriented x3 his amputation site is gone on to heal uneventfully large portions of the scabs are coming off and his skin is looking more normal by the day.  He does have a prominent fifth metatarsal base of the left foot does not appear to be ulcerated at this time. ? ?Assessment: Well-healing surgical foot left. ? ?Plan: I like to follow-up with him in 2 weeks. ?

## 2021-10-08 ENCOUNTER — Encounter: Payer: Medicare Other | Admitting: Podiatry

## 2021-10-10 ENCOUNTER — Ambulatory Visit (INDEPENDENT_AMBULATORY_CARE_PROVIDER_SITE_OTHER): Payer: Medicare Other | Admitting: Family Medicine

## 2021-10-10 ENCOUNTER — Encounter: Payer: Self-pay | Admitting: Family Medicine

## 2021-10-10 ENCOUNTER — Other Ambulatory Visit: Payer: Self-pay

## 2021-10-10 VITALS — BP 146/80 | HR 80 | Ht 73.0 in | Wt 187.8 lb

## 2021-10-10 DIAGNOSIS — L570 Actinic keratosis: Secondary | ICD-10-CM | POA: Diagnosis not present

## 2021-10-10 DIAGNOSIS — C61 Malignant neoplasm of prostate: Secondary | ICD-10-CM

## 2021-10-10 DIAGNOSIS — R079 Chest pain, unspecified: Secondary | ICD-10-CM | POA: Diagnosis not present

## 2021-10-10 DIAGNOSIS — C9001 Multiple myeloma in remission: Secondary | ICD-10-CM

## 2021-10-10 DIAGNOSIS — I10 Essential (primary) hypertension: Secondary | ICD-10-CM

## 2021-10-10 DIAGNOSIS — N183 Chronic kidney disease, stage 3 unspecified: Secondary | ICD-10-CM

## 2021-10-10 DIAGNOSIS — D638 Anemia in other chronic diseases classified elsewhere: Secondary | ICD-10-CM | POA: Diagnosis not present

## 2021-10-10 DIAGNOSIS — E781 Pure hyperglyceridemia: Secondary | ICD-10-CM

## 2021-10-10 DIAGNOSIS — D61818 Other pancytopenia: Secondary | ICD-10-CM | POA: Diagnosis not present

## 2021-10-10 DIAGNOSIS — S98112A Complete traumatic amputation of left great toe, initial encounter: Secondary | ICD-10-CM

## 2021-10-10 DIAGNOSIS — M545 Low back pain, unspecified: Secondary | ICD-10-CM

## 2021-10-10 DIAGNOSIS — G8929 Other chronic pain: Secondary | ICD-10-CM

## 2021-10-10 DIAGNOSIS — E538 Deficiency of other specified B group vitamins: Secondary | ICD-10-CM

## 2021-10-10 DIAGNOSIS — R0609 Other forms of dyspnea: Secondary | ICD-10-CM

## 2021-10-10 DIAGNOSIS — G6289 Other specified polyneuropathies: Secondary | ICD-10-CM

## 2021-10-10 NOTE — Assessment & Plan Note (Signed)
Usually well controlled on no medication. ?Slightly more elevated today. ?

## 2021-10-10 NOTE — Assessment & Plan Note (Signed)
Urology Dr. Alinda Money. S/P prostatectomy.. on hormonal blocker ?

## 2021-10-10 NOTE — Assessment & Plan Note (Signed)
Stable overall.  

## 2021-10-10 NOTE — Assessment & Plan Note (Signed)
Followed by dermatology Dr. Pearline Cables ?

## 2021-10-10 NOTE — Assessment & Plan Note (Signed)
Secondary to the myeloma ? S/P back surgery. ? Using oxycodone  Prn and fentanyl patches. ?Followed by Mr. Water PA at [facility] pain center ?

## 2021-10-10 NOTE — Patient Instructions (Addendum)
If chest pain  or shortness of breath worsens.Marland Kitchen go to ER. ? We will start setting up an US /ECHO of your heart. ? ?

## 2021-10-10 NOTE — Assessment & Plan Note (Signed)
Followed by Dr.Gorsuch for oncology ?

## 2021-10-10 NOTE — Assessment & Plan Note (Signed)
Stable on supplement ?

## 2021-10-10 NOTE — Assessment & Plan Note (Signed)
Needs to be  re-evaluated. ?

## 2021-10-10 NOTE — Progress Notes (Signed)
? ? Patient ID: [name], male    DOB: [birth year], 76 y.o.   MRN: [id number] ? ?This visit was conducted in person. ? ?BP (!) 146/80   Pulse 80   Ht '6\' 1"'  (1.854 m)   Wt 187 lb 12.8 oz (85.2 kg)   SpO2 99%   BMI 24.78 kg/m?   ? ?CC:  ?Chief Complaint  ?Patient presents with  ? New Patient (Initial Visit)  ?  Re est , having shortness  breath , and chest pain , having chest pain twice  day , have pain  going  3 weeks   ? ? ?Subjective:  ? ?HPI: ?[name] is a 76 y.o. male presenting on 10/10/2021 for New Patient (Initial Visit) (Re est , having shortness  breath , and chest pain , having chest pain twice  day , have pain  going  3 weeks ) ? ?Hypertension:     ?BP Readings from Last 3 Encounters:  ?10/10/21 (!) 146/80  ?09/22/21 127/72  ?06/20/21 (!) 149/74  ?Using medication without problems or lightheadedness:  ?Chest pain with exertion: ?Edema: ?Short of breath: ?Average home BPs: ?Other issues: ? ? In normal state health until 3 weeks ago. ? Started having SOB, when talking. ? Occ sharp pain  " ping" very momentary. ? Some worse with exertion. ? No swelling, no increase weight lately. ? No cough, no fever. ? No reflux  symptoms. ?? Cholesterol. ? ? No past stress test known. ? ?  ? ?Relevant past medical, surgical, family and social history reviewed and updated as indicated. Interim medical history since our last visit reviewed. ?Allergies and medications reviewed and updated. ?Outpatient Medications Prior to Visit  ?Medication Sig Dispense Refill  ? aspirin 81 MG tablet Take 81 mg by mouth daily.    ? B Complex Vitamins (VITAMIN-B COMPLEX) TABS Take by mouth.    ? Calcium Carbonate-Vitamin D 600-400 MG-UNIT per tablet Take by mouth.    ? cholecalciferol (VITAMIN D) 1000 UNITS tablet Take 1,000 Units by mouth daily.    ? diclofenac Sodium (VOLTAREN) 1 % GEL Apply 4 g topically 4 (four) times daily. 150 g 1  ? fentaNYL (DURAGESIC) 75 MCG/HR APP 1 PA EXT TO THE SKIN QOD    ? lidocaine-prilocaine  (EMLA) cream Apply topically as needed. Apply to port site one hour before treatment and cover with plastic wrap 30 g 2  ? mupirocin ointment (BACTROBAN) 2 % Apply 1 application topically 2 (two) times daily. 22 g 6  ? NONFORMULARY OR COMPOUNDED ITEM Baclofn/amitrp/ketamn cream topically to feet TID prn for neuropathy    ? NUBEQA 300 MG tablet Take 600 mg by mouth 2 (two) times daily.    ? oxyCODONE (OXY IR/ROXICODONE) 5 MG immediate release tablet TK 1 T PO Q 4 H PRN P  0  ? pantoprazole (PROTONIX) 40 MG tablet TAKE 1 TABLET BY MOUTH EVERY DAY 90 tablet 3  ? ?No facility-administered medications prior to visit.  ?  ? ?Per HPI unless specifically indicated in ROS section below ?Review of Systems  ?Constitutional:  Negative for fatigue and fever.  ?HENT:  Negative for ear pain.   ?Eyes:  Negative for pain.  ?Respiratory:  Positive for shortness of breath. Negative for cough.   ?Cardiovascular:  Positive for chest pain. Negative for palpitations and leg swelling.  ?Gastrointestinal:  Negative for abdominal pain.  ?Genitourinary:  Negative for dysuria.  ?Musculoskeletal:  Negative for arthralgias.  ?Neurological:  Negative for  syncope, light-headedness and headaches.  ?Psychiatric/Behavioral:  Negative for dysphoric mood.   ?Objective:  ?BP (!) 146/80   Pulse 80   Ht '6\' 1"'  (1.854 m)   Wt 187 lb 12.8 oz (85.2 kg)   SpO2 99%   BMI 24.78 kg/m?   ?Wt Readings from Last 3 Encounters:  ?10/10/21 187 lb 12.8 oz (85.2 kg)  ?09/22/21 187 lb 3.2 oz (84.9 kg)  ?06/20/21 187 lb 12.8 oz (85.2 kg)  ?  ?  ?Physical Exam ?Constitutional:   ?   Appearance: He is well-developed.  ?HENT:  ?   Head: Normocephalic.  ?   Right Ear: Hearing normal.  ?   Left Ear: Hearing normal.  ?   Nose: Nose normal.  ?Neck:  ?   Thyroid: No thyroid mass or thyromegaly.  ?   Vascular: No carotid bruit.  ?   Trachea: Trachea normal.  ?Cardiovascular:  ?   Rate and Rhythm: Normal rate and regular rhythm.  ?   Pulses: Normal pulses.  ?   Heart sounds:  Heart sounds not distant. No murmur heard. ?  No friction rub. No gallop.  ?   Comments: No peripheral edema ?Pulmonary:  ?   Effort: Pulmonary effort is normal. No respiratory distress.  ?   Breath sounds: Normal breath sounds.  ?Skin: ?   General: Skin is warm and dry.  ?   Findings: No rash.  ?Psychiatric:     ?   Speech: Speech normal.     ?   Behavior: Behavior normal.     ?   Thought Content: Thought content normal.  ? ?   ?Results for orders placed or performed in visit on 09/22/21  ?Multiple Myeloma Panel (SPEP&IFE w/QIG)  ?Result Value Ref Range  ? IgG (Immunoglobin G), Serum 1,229 603 - 1,613 mg/dL  ? IgA 217 61 - 437 mg/dL  ? IgM (Immunoglobulin M), Srm 37 15 - 143 mg/dL  ? Total Protein ELP 6.7 6.0 - 8.5 g/dL  ? Albumin SerPl Elph-Mcnc 3.5 2.9 - 4.4 g/dL  ? Alpha 1 0.3 0.0 - 0.4 g/dL  ? Alpha2 Glob SerPl Elph-Mcnc 0.8 0.4 - 1.0 g/dL  ? B-Globulin SerPl Elph-Mcnc 0.9 0.7 - 1.3 g/dL  ? Gamma Glob SerPl Elph-Mcnc 1.2 0.4 - 1.8 g/dL  ? M Protein SerPl Elph-Mcnc Not Observed Not Observed g/dL  ? Globulin, Total 3.2 2.2 - 3.9 g/dL  ? Albumin/Glob SerPl 1.1 0.7 - 1.7  ? IFE 1 Comment   ? Please Note Comment   ?Kappa/lambda light chains  ?Result Value Ref Range  ? Kappa free light chain 47.6 (H) 3.3 - 19.4 mg/L  ? Lambda free light chains 30.9 (H) 5.7 - 26.3 mg/L  ? Kappa, lambda light chain ratio 1.54 0.26 - 1.65  ?CBC with Differential/Platelet  ?Result Value Ref Range  ? WBC 7.5 4.0 - 10.5 K/uL  ? RBC 3.76 (L) 4.22 - 5.81 MIL/uL  ? Hemoglobin 12.6 (L) 13.0 - 17.0 g/dL  ? HCT 37.4 (L) 39.0 - 52.0 %  ? MCV 99.5 80.0 - 100.0 fL  ? MCH 33.5 26.0 - 34.0 pg  ? MCHC 33.7 30.0 - 36.0 g/dL  ? RDW 12.6 11.5 - 15.5 %  ? Platelets 215 150 - 400 K/uL  ? nRBC 0.0 0.0 - 0.2 %  ? Neutrophils Relative % 65 %  ? Neutro Abs 4.9 1.7 - 7.7 K/uL  ? Lymphocytes Relative 19 %  ? Lymphs Abs 1.4 0.7 -  4.0 K/uL  ? Monocytes Relative 13 %  ? Monocytes Absolute 1.0 0.1 - 1.0 K/uL  ? Eosinophils Relative 2 %  ? Eosinophils Absolute 0.1  0.0 - 0.5 K/uL  ? Basophils Relative 1 %  ? Basophils Absolute 0.0 0.0 - 0.1 K/uL  ? Immature Granulocytes 0 %  ? Abs Immature Granulocytes 0.02 0.00 - 0.07 K/uL  ?Comprehensive metabolic panel  ?Result Value Ref Range  ? Sodium 137 135 - 145 mmol/L  ? Potassium 4.1 3.5 - 5.1 mmol/L  ? Chloride 104 98 - 111 mmol/L  ? CO2 27 22 - 32 mmol/L  ? Glucose, Bld 102 (H) 70 - 99 mg/dL  ? BUN 24 (H) 8 - 23 mg/dL  ? Creatinine, Ser 1.62 (H) 0.61 - 1.24 mg/dL  ? Calcium 9.5 8.9 - 10.3 mg/dL  ? Total Protein 7.3 6.5 - 8.1 g/dL  ? Albumin 3.8 3.5 - 5.0 g/dL  ? AST 18 15 - 41 U/L  ? ALT 11 0 - 44 U/L  ? Alkaline Phosphatase 53 38 - 126 U/L  ? Total Bilirubin 0.8 0.3 - 1.2 mg/dL  ? GFR, Estimated 44 (L) >60 mL/min  ? Anion gap 6 5 - 15  ? ?*Note: Due to a large number of results and/or encounters for the requested time period, some results have not been displayed. A complete set of results can be found in Results Review.  ? ? ?This visit occurred during the SARS-CoV-2 public health emergency.  Safety protocols were in place, including screening questions prior to the visit, additional usage of staff PPE, and extensive cleaning of exam room while observing appropriate contact time as indicated for disinfecting solutions.  ? ?COVID 19 screen:  No recent travel or known exposure to [place] ?The patient denies respiratory symptoms of COVID 19 at this time. ?The importance of social distancing was discussed today.  ? ?Assessment and Plan ?Problem List Items Addressed This Visit   ? ? 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   Followed by Dr.Gorsuch for oncology ?  ?  ? AK (actinic keratosis)  ?   Followed by dermatology Dr. Pearline Cables ?  ?  ? 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    Chronic infeciton and ulcer left toes.. now amputated all 5 digits on left. Dr. Milinda Pointer. ?  ?  ? Anemia of chronic disease  ?   Stable at baseline 2 weeks ago.. 11.6 Hemoglobin ?  ?  ? B12 deficiency  ?   Stable on supplement ?  ?  ? Chest pain in adult - Primary  ?  EKG  reviewed.  EKG is unremarkable with no new changes compared to previous 2012. RBBB, no specific  ST changes. ? ?No red flags and not clearly cardiac in source. ?Pain appears to be most likely musculoskeletal, less likely

## 2021-10-10 NOTE — Assessment & Plan Note (Signed)
Using topical medication to treat. ?

## 2021-10-10 NOTE — Assessment & Plan Note (Signed)
Followed by ONcology Dr. Alvy Bimler ?

## 2021-10-10 NOTE — Assessment & Plan Note (Signed)
Stable at baseline 2 weeks ago.. 11.6 Hemoglobin ?

## 2021-10-10 NOTE — Assessment & Plan Note (Signed)
Chronic infeciton and ulcer left toes.. now amputated all 5 digits on left. Dr. Milinda Pointer. ?

## 2021-10-14 ENCOUNTER — Telehealth: Payer: Self-pay | Admitting: Family Medicine

## 2021-10-14 NOTE — Telephone Encounter (Signed)
Pt friend called asking for a call back to discuss pt results. Please advise. ?

## 2021-10-14 NOTE — Telephone Encounter (Signed)
The test was an echocardiogram.  ?[name], can you help me figure out who he needs to call back? You or imaging location? ?

## 2021-10-14 NOTE — Telephone Encounter (Signed)
Patient called stating that he saw Dr. Diona Browner and she was suppose to order a test for him. Patient stated that someone called earlier today while they were out and left a message and it got erased from their machine. Patient wants to know what test he is suppose to have and where he is suppose to go for the test.. ?

## 2021-10-14 NOTE — Telephone Encounter (Signed)
Left a message on voicemail to call the office back. ?

## 2021-10-15 NOTE — Telephone Encounter (Signed)
Patient has called the office back wanting information regarding scheduling the test needed. ?

## 2021-10-16 ENCOUNTER — Telehealth: Payer: Self-pay | Admitting: Family Medicine

## 2021-10-16 DIAGNOSIS — E781 Pure hyperglyceridemia: Secondary | ICD-10-CM

## 2021-10-16 DIAGNOSIS — E538 Deficiency of other specified B group vitamins: Secondary | ICD-10-CM

## 2021-10-16 NOTE — Telephone Encounter (Signed)
ECHO appointment information called to Mr. Resende.  ?

## 2021-10-16 NOTE — Telephone Encounter (Signed)
-----   Message from Ellamae Sia sent at 10/13/2021  4:02 PM EDT ----- ?Regarding: Lab orders for Friday, 10/17/21 ?Patient is scheduled for CPX labs, please order future labs, Thanks , Terri  ? ?

## 2021-10-17 ENCOUNTER — Other Ambulatory Visit (INDEPENDENT_AMBULATORY_CARE_PROVIDER_SITE_OTHER): Payer: Medicare Other

## 2021-10-17 ENCOUNTER — Other Ambulatory Visit: Payer: Self-pay

## 2021-10-17 DIAGNOSIS — E781 Pure hyperglyceridemia: Secondary | ICD-10-CM | POA: Diagnosis not present

## 2021-10-17 LAB — COMPREHENSIVE METABOLIC PANEL
ALT: 11 U/L (ref 0–53)
AST: 23 U/L (ref 0–37)
Albumin: 3.9 g/dL (ref 3.5–5.2)
Alkaline Phosphatase: 53 U/L (ref 39–117)
BUN: 22 mg/dL (ref 6–23)
CO2: 30 mEq/L (ref 19–32)
Calcium: 9.6 mg/dL (ref 8.4–10.5)
Chloride: 103 mEq/L (ref 96–112)
Creatinine, Ser: 1.34 mg/dL (ref 0.40–1.50)
GFR: 51.64 mL/min — ABNORMAL LOW (ref >60.00)
Glucose, Bld: 90 mg/dL (ref 70–99)
Potassium: 4.2 mEq/L (ref 3.5–5.1)
Sodium: 138 mEq/L (ref 135–145)
Total Bilirubin: 0.7 mg/dL (ref 0.2–1.2)
Total Protein: 7.2 g/dL (ref 6.0–8.3)

## 2021-10-17 LAB — LIPID PANEL
Cholesterol: 139 mg/dL (ref 0–200)
HDL: 45.1 mg/dL (ref >39.00)
LDL Cholesterol: 74 mg/dL (ref 0–99)
NonHDL: 93.72
Total CHOL/HDL Ratio: 3
Triglycerides: 97 mg/dL (ref 0.0–149.0)
VLDL: 19.4 mg/dL (ref 0.0–40.0)

## 2021-10-17 NOTE — Progress Notes (Signed)
No critical labs need to be addressed urgently. We will discuss labs in detail at upcoming office visit.   

## 2021-10-20 ENCOUNTER — Encounter: Payer: Self-pay | Admitting: Podiatry

## 2021-10-20 ENCOUNTER — Ambulatory Visit (INDEPENDENT_AMBULATORY_CARE_PROVIDER_SITE_OTHER): Payer: Medicare Other | Admitting: Podiatry

## 2021-10-20 ENCOUNTER — Other Ambulatory Visit: Payer: Self-pay

## 2021-10-20 DIAGNOSIS — L97521 Non-pressure chronic ulcer of other part of left foot limited to breakdown of skin: Secondary | ICD-10-CM

## 2021-10-20 DIAGNOSIS — Z9889 Other specified postprocedural states: Secondary | ICD-10-CM

## 2021-10-20 NOTE — Progress Notes (Signed)
He presents today for postop visit date of surgery August 23, 2021 transmetatarsal amputation left foot.  Denies fever chills nausea vomiting muscle aches pains.  States that he feels fine.  Still continues to walk up a sock increment in the end of the toe of his shoe.  States that he does not want a toe filler for his shoes. ? ?Objective: Vital signs are stable alert and oriented x3 there is no erythema edema cellulitis drainage or odor no signs of infection no purulence no malodor and there is no skin breakdown and there is no callus formation. ? ?Assessment: Well-healing transmet amp left. ? ?Plan: Follow-up with me as needed.  I will most likely follow-up with him in 1 month trim the nails on his right foot. ?

## 2021-10-24 ENCOUNTER — Encounter: Payer: Self-pay | Admitting: Family Medicine

## 2021-10-24 ENCOUNTER — Ambulatory Visit (INDEPENDENT_AMBULATORY_CARE_PROVIDER_SITE_OTHER): Payer: Medicare Other | Admitting: Family Medicine

## 2021-10-24 VITALS — BP 110/60 | HR 72 | Temp 97.9°F | Ht 71.5 in | Wt 185.6 lb

## 2021-10-24 DIAGNOSIS — Z0000 Encounter for general adult medical examination without abnormal findings: Secondary | ICD-10-CM

## 2021-10-24 DIAGNOSIS — R079 Chest pain, unspecified: Secondary | ICD-10-CM

## 2021-10-24 NOTE — Progress Notes (Signed)
? ? Patient ID: [name], male    DOB: October 06, 1945, 76 y.o.   MRN: [id number] ? ?This visit was conducted in person. ? ?BP 110/60   Pulse 72   Temp 97.9 ?F (36.6 ?C) (Oral)   Ht 5' 11.5" (1.816 m)   Wt 185 lb 9 oz (84.2 kg)   SpO2 96%   BMI 25.52 kg/m?   ? ?CC:  ?Chief Complaint  ?Patient presents with  ? Medicare Wellness  ? ? ?Subjective:  ? ?HPI: ?[name] is a 76 y.o. male presenting on 10/24/2021 for Medicare Wellness ? ?The patient presents for annual medicare wellness, complete physical and review of chronic health problems. He/She also has the following acute concerns today:  ? ?Chest pain intermittent, momentary in last 3 weeks. ? Occurring 2 times a day... usually happens at rest. ? Notes most in bed when rolling over on your side. ? Mild SOB. ? ?EKG unremarkable. ?  ? No swelling, no increase weight lately. ? No cough, no fever. ? No reflux  symptoms. ? ? ?Wt Readings from Last 3 Encounters:  ?10/24/21 185 lb 9 oz (84.2 kg)  ?10/10/21 187 lb 12.8 oz (85.2 kg)  ?09/22/21 187 lb 3.2 oz (84.9 kg)  ? ? ? ?I have personally reviewed the Medicare Annual Wellness questionnaire and have noted ?1. The patient's medical and social history ?2. Their use of alcohol, tobacco or illicit drugs ?3. Their current medications and supplements ?4. The patient's functional ability including ADL's, fall risks, home safety risks and hearing or visual ?            impairment. ?5. Diet and physical activities ?6. Evidence for depression or mood disorders ?7.         Updated provider list ?Cognitive evaluation was performed and recorded on pt medicare questionnaire form. ?The patients weight, height, BMI and visual acuity have been recorded in the chart   ?I have made referrals, counseling and provided education to the patient based review of the above and I have provided the pt with a written personalized care plan for preventive services.  ? Documentation of this information was scanned into the electronic  record under the media tab. ? ? Advance directives and end of life planning reviewed in detail with patient and documented in EMR. Patient given handout on advance care directives if needed. HCPOA and living will updated if needed. ? ?Hearing Screening  ?Method: Audiometry  ? 500Hz  1000Hz  2000Hz  4000Hz   ?Right ear 20 20 20 20   ?Left ear 25 25 20 25   ? ?Vision Screening  ? Right eye Left eye Both eyes  ?Without correction 20/100 20/50 20/50  ?With correction     ? Has implants, has hole in retina.. followed by Opthamology once a year. ?No falls in last 12 months. ? ?Personnel officer Visit from 10/24/2021 in [facility]  ?PHQ-2 Total Score 0  ? ?  ? ? ?Recent visit to re-establish and review chronic health issues. ? ? Reviewed labs in detail  ? ?Lab Results  ?Component Value Date  ? CHOL 139 10/17/2021  ? HDL 45.10 10/17/2021  ? [place] 74 10/17/2021  ? LDLDIRECT 124.3 11/16/2007  ? TRIG 97.0 10/17/2021  ? CHOLHDL 3 10/17/2021  ? ? ? ?   ? ?Relevant past medical, surgical, family and social history reviewed and updated as indicated. Interim medical history since our last visit reviewed. ?Allergies and medications reviewed and updated. ?Outpatient Medications Prior to  Visit  ?Medication Sig Dispense Refill  ? aspirin 81 MG tablet Take 81 mg by mouth daily.    ? B Complex Vitamins (VITAMIN-B COMPLEX) TABS Take by mouth.    ? Calcium Carbonate-Vitamin D 600-400 MG-UNIT per tablet Take by mouth.    ? cholecalciferol (VITAMIN D) 1000 UNITS tablet Take 1,000 Units by mouth daily.    ? diclofenac Sodium (VOLTAREN) 1 % GEL Apply 4 g topically 4 (four) times daily. 150 g 1  ? fentaNYL (DURAGESIC) 75 MCG/HR Place onto the skin.    ? lidocaine-prilocaine (EMLA) cream Apply topically as needed. Apply to port site one hour before treatment and cover with plastic wrap 30 g 2  ? mupirocin ointment (BACTROBAN) 2 % Apply 1 application topically 2 (two) times daily. 22 g 6  ? NONFORMULARY OR COMPOUNDED ITEM  Baclofn/amitrp/ketamn cream topically to feet TID prn for neuropathy    ? NUBEQA 300 MG tablet Take 600 mg by mouth 2 (two) times daily.    ? oxyCODONE (OXY IR/ROXICODONE) 5 MG immediate release tablet TK 1 T PO Q 4 H PRN P  0  ? pantoprazole (PROTONIX) 40 MG tablet TAKE 1 TABLET BY MOUTH EVERY DAY 90 tablet 3  ? ?No facility-administered medications prior to visit.  ?  ? ?Per HPI unless specifically indicated in ROS section below ?Review of Systems  ?Constitutional:  Negative for fatigue and fever.  ?HENT:  Negative for ear pain.   ?Eyes:  Negative for pain.  ?Respiratory:  Negative for cough and shortness of breath.   ?Cardiovascular:  Negative for chest pain, palpitations and leg swelling.  ?Gastrointestinal:  Negative for abdominal pain.  ?Genitourinary:  Negative for dysuria.  ?Musculoskeletal:  Negative for arthralgias.  ?Neurological:  Negative for syncope, light-headedness and headaches.  ?Psychiatric/Behavioral:  Negative for dysphoric mood.   ?Objective:  ?BP 110/60   Pulse 72   Temp 97.9 ?F (36.6 ?C) (Oral)   Ht 5' 11.5" (1.816 m)   Wt 185 lb 9 oz (84.2 kg)   SpO2 96%   BMI 25.52 kg/m?   ?Wt Readings from Last 3 Encounters:  ?10/24/21 185 lb 9 oz (84.2 kg)  ?10/10/21 187 lb 12.8 oz (85.2 kg)  ?09/22/21 187 lb 3.2 oz (84.9 kg)  ?  ?  ?Physical Exam ?Constitutional:   ?   Appearance: He is well-developed.  ?HENT:  ?   Head: Normocephalic.  ?   Right Ear: Hearing normal.  ?   Left Ear: Hearing normal.  ?   Nose: Nose normal.  ?Neck:  ?   Thyroid: No thyroid mass or thyromegaly.  ?   Vascular: No carotid bruit.  ?   Trachea: Trachea normal.  ?Cardiovascular:  ?   Rate and Rhythm: Normal rate and regular rhythm.  ?   Pulses: Normal pulses.  ?   Heart sounds: Heart sounds not distant. No murmur heard. ?  No friction rub. No gallop.  ?   Comments: No peripheral edema ?Pulmonary:  ?   Effort: Pulmonary effort is normal. No respiratory distress.  ?   Breath sounds: Normal breath sounds.  ?Skin: ?   General:  Skin is warm and dry.  ?   Findings: No rash.  ?Psychiatric:     ?   Speech: Speech normal.     ?   Behavior: Behavior normal.     ?   Thought Content: Thought content normal.  ? ?   ?Results for orders placed or performed in visit  on 10/17/21  ?Comprehensive metabolic panel  ?Result Value Ref Range  ? Sodium 138 135 - 145 mEq/L  ? Potassium 4.2 3.5 - 5.1 mEq/L  ? Chloride 103 96 - 112 mEq/L  ? CO2 30 19 - 32 mEq/L  ? Glucose, Bld 90 70 - 99 mg/dL  ? BUN 22 6 - 23 mg/dL  ? Creatinine, Ser 1.34 0.40 - 1.50 mg/dL  ? Total Bilirubin 0.7 0.2 - 1.2 mg/dL  ? Alkaline Phosphatase 53 39 - 117 U/L  ? AST 23 0 - 37 U/L  ? ALT 11 0 - 53 U/L  ? Total Protein 7.2 6.0 - 8.3 g/dL  ? Albumin 3.9 3.5 - 5.2 g/dL  ? GFR 51.64 (L) >60.00 mL/min  ? Calcium 9.6 8.4 - 10.5 mg/dL  ?Lipid panel  ?Result Value Ref Range  ? Cholesterol 139 0 - 200 mg/dL  ? Triglycerides 97.0 0.0 - 149.0 mg/dL  ? HDL 45.10 >39.00 mg/dL  ? VLDL 19.4 0.0 - 40.0 mg/dL  ? LDL Cholesterol 74 0 - 99 mg/dL  ? Total CHOL/HDL Ratio 3   ? NonHDL 93.72   ? ?*Note: Due to a large number of results and/or encounters for the requested time period, some results have not been displayed. A complete set of results can be found in Results Review.  ? ? ?This visit occurred during the SARS-CoV-2 public health emergency.  Safety protocols were in place, including screening questions prior to the visit, additional usage of staff PPE, and extensive cleaning of exam room while observing appropriate contact time as indicated for disinfecting solutions.  ? ?COVID 19 screen:  No recent travel or known exposure to [place] ?The patient denies respiratory symptoms of COVID 19 at this time. ?The importance of social distancing was discussed today.  ? ?Assessment and Plan ? ? The patient's preventative maintenance and recommended screening tests for an annual wellness exam were reviewed in full today. ?Brought up to date unless services declined. ? ?Counselled on the importance of diet,  exercise, and its role in overall health and mortality. ?The patient's FH and SH was reviewed, including their home life, tobacco status, and drug and alcohol status.  ? ? ?Vaccines: Shingrix, PNA, flu and td ?Prostate Cance

## 2021-10-24 NOTE — Assessment & Plan Note (Signed)
Acute, stable ?His description of this pain is most consistent with musculoskeletal issue from his back issues.  Given he has some mild shortness of breath that is new as well we will evaluate this with an echocardiogram which is pending next week. ?He can start chest wall stretches and heat as needed.  No clear GI issues.Marland Kitchen ?He denies anxiety. ?He was given return and ER precautions. ?

## 2021-10-24 NOTE — Assessment & Plan Note (Signed)
EKG reviewed.  EKG is unremarkable with no new changes compared to previous 2012. RBBB, no specific  ST changes. ? ?No red flags and not clearly cardiac in source. ?Pain appears to be most likely musculoskeletal, less likely GI.  Given his shortness of breath I will move forward with an echocardiogram of his heart.  ?

## 2021-10-24 NOTE — Patient Instructions (Addendum)
We will call with ECHO results. ?  ?

## 2021-11-03 ENCOUNTER — Other Ambulatory Visit: Payer: Medicare Other

## 2021-11-03 ENCOUNTER — Other Ambulatory Visit: Payer: Self-pay

## 2021-11-03 ENCOUNTER — Ambulatory Visit (HOSPITAL_COMMUNITY)
Admission: RE | Admit: 2021-11-03 | Discharge: 2021-11-03 | Disposition: A | Discharge status: Home / Self Care | Payer: Medicare Other | Source: Ambulatory Visit | Attending: Family Medicine | Admitting: Family Medicine

## 2021-11-03 ENCOUNTER — Encounter: Payer: Self-pay | Admitting: Hematology and Oncology

## 2021-11-03 ENCOUNTER — Inpatient Hospital Stay: Payer: Medicare Other | Attending: Hematology and Oncology

## 2021-11-03 ENCOUNTER — Ambulatory Visit: Payer: Medicare Other | Admitting: Hematology and Oncology

## 2021-11-03 ENCOUNTER — Inpatient Hospital Stay (HOSPITAL_BASED_OUTPATIENT_CLINIC_OR_DEPARTMENT_OTHER): Payer: Medicare Other | Admitting: Hematology and Oncology

## 2021-11-03 DIAGNOSIS — I083 Combined rheumatic disorders of mitral, aortic and tricuspid valves: Secondary | ICD-10-CM | POA: Insufficient documentation

## 2021-11-03 DIAGNOSIS — J90 Pleural effusion, not elsewhere classified: Secondary | ICD-10-CM | POA: Diagnosis present

## 2021-11-03 DIAGNOSIS — C61 Malignant neoplasm of prostate: Secondary | ICD-10-CM

## 2021-11-03 DIAGNOSIS — Z79899 Other long term (current) drug therapy: Secondary | ICD-10-CM | POA: Diagnosis not present

## 2021-11-03 DIAGNOSIS — C9000 Multiple myeloma not having achieved remission: Secondary | ICD-10-CM | POA: Insufficient documentation

## 2021-11-03 DIAGNOSIS — C9001 Multiple myeloma in remission: Secondary | ICD-10-CM

## 2021-11-03 DIAGNOSIS — I10 Essential (primary) hypertension: Secondary | ICD-10-CM | POA: Diagnosis not present

## 2021-11-03 DIAGNOSIS — N183 Chronic kidney disease, stage 3 unspecified: Secondary | ICD-10-CM | POA: Diagnosis not present

## 2021-11-03 DIAGNOSIS — R0600 Dyspnea, unspecified: Secondary | ICD-10-CM | POA: Diagnosis present

## 2021-11-03 DIAGNOSIS — Z95828 Presence of other vascular implants and grafts: Secondary | ICD-10-CM

## 2021-11-03 DIAGNOSIS — R0609 Other forms of dyspnea: Secondary | ICD-10-CM

## 2021-11-03 LAB — CBC WITH DIFFERENTIAL/PLATELET
Abs Immature Granulocytes: 0.01 10*3/uL (ref 0.00–0.07)
Basophils Absolute: 0.1 10*3/uL (ref 0.0–0.1)
Basophils Relative: 1 %
Eosinophils Absolute: 0.2 10*3/uL (ref 0.0–0.5)
Eosinophils Relative: 3 %
HCT: 36.3 % — ABNORMAL LOW (ref 39.0–52.0)
Hemoglobin: 12.2 g/dL — ABNORMAL LOW (ref 13.0–17.0)
Immature Granulocytes: 0 %
Lymphocytes Relative: 15 %
Lymphs Abs: 1.1 10*3/uL (ref 0.7–4.0)
MCH: 33.2 pg (ref 26.0–34.0)
MCHC: 33.6 g/dL (ref 30.0–36.0)
MCV: 98.6 fL (ref 80.0–100.0)
Monocytes Absolute: 0.9 10*3/uL (ref 0.1–1.0)
Monocytes Relative: 13 %
Neutro Abs: 4.7 10*3/uL (ref 1.7–7.7)
Neutrophils Relative %: 68 %
Platelets: 216 10*3/uL (ref 150–400)
RBC: 3.68 MIL/uL — ABNORMAL LOW (ref 4.22–5.81)
RDW: 12.5 % (ref 11.5–15.5)
WBC: 6.9 10*3/uL (ref 4.0–10.5)
nRBC: 0 % (ref 0.0–0.2)

## 2021-11-03 LAB — COMPREHENSIVE METABOLIC PANEL
ALT: 11 U/L (ref 0–44)
AST: 18 U/L (ref 15–41)
Albumin: 3.6 g/dL (ref 3.5–5.0)
Alkaline Phosphatase: 58 U/L (ref 38–126)
Anion gap: 6 (ref 5–15)
BUN: 21 mg/dL (ref 8–23)
CO2: 28 mmol/L (ref 22–32)
Calcium: 9.2 mg/dL (ref 8.9–10.3)
Chloride: 104 mmol/L (ref 98–111)
Creatinine, Ser: 1.28 mg/dL — ABNORMAL HIGH (ref 0.61–1.24)
GFR, Estimated: 58 mL/min — ABNORMAL LOW (ref >60)
Glucose, Bld: 98 mg/dL (ref 70–99)
Potassium: 4 mmol/L (ref 3.5–5.1)
Sodium: 138 mmol/L (ref 135–145)
Total Bilirubin: 0.6 mg/dL (ref 0.3–1.2)
Total Protein: 7.1 g/dL (ref 6.5–8.1)

## 2021-11-03 MED ORDER — SODIUM CHLORIDE 0.9% FLUSH
10.0000 mL | INTRAVENOUS | Status: DC | PRN
  Administered 2021-11-03: 10 mL

## 2021-11-03 MED ORDER — HEPARIN SOD (PORK) LOCK FLUSH 100 UNIT/ML IV SOLN
500.0000 [IU] | Freq: Once | INTRAVENOUS | Status: AC | PRN
  Administered 2021-11-03: 500 [IU] via INTRAVENOUS

## 2021-11-03 NOTE — Assessment & Plan Note (Signed)
Myeloma results are pending ?I will call him with result next visit ?The patient has been on Zometa for over 10 years and I think it is also time to discontinue to reduce risk of pathological fracture and osteonecrosis of the jaw ?He will continue calcium and vitamin D only and we can consider Zometa once a year, next due in June ?

## 2021-11-03 NOTE — Progress Notes (Signed)
Echocardiogram ?2D Echocardiogram has been performed. ? ?[name] ?11/03/2021, 9:42 AM ?

## 2021-11-03 NOTE — Progress Notes (Signed)
[facility] ?OFFICE PROGRESS NOTE ? ?Patient Care Team: ?Jinny Sanders, MD as PCP - General ?Jeanann Lewandowsky, MD as Consulting Physician (Hematology and Oncology) ?Cathlean Marseilles, NP as Nurse Practitioner (Hematology and Oncology) ?Raynelle Bring, MD as Consulting Physician (Urology) ?Heath Lark, MD as Consulting Physician (Hematology and Oncology) ?Hyatt, Max T, DPM as Veterinary surgeon) ?Waters, Howell Rucks, NP as Nurse Practitioner (Pain Medicine) ?Druscilla Brownie, MD as Consulting Physician (Dermatology) ?Bryson Ha, OD as Consulting Physician (Optometry) ? ?ASSESSMENT & PLAN:  ?Multiple myeloma in remission ([place]) ?Myeloma results are pending ?I will call him with result next visit ?The patient has been on Zometa for over 10 years and I think it is also time to discontinue to reduce risk of pathological fracture and osteonecrosis of the jaw ?He will continue calcium and vitamin D only and we can consider Zometa once a year, next due in June ? ?Chronic kidney disease, stage III (moderate) ?He is known to have intermittent elevated serum creatinine ?This is not due to myeloma ?We discussed importance of adequate hydration ? ?Malignant neoplasm of prostate ([place]) ?According to the patient, his PSA was recently elevated ?I would defer to urologist for management ? ?No orders of the defined types were placed in this encounter. ? ? ?All questions were answered. The patient knows to call the clinic with any problems, questions or concerns. ?The total time spent in the appointment was 25 minutes encounter with patients including review of chart and various tests results, discussions about plan of care and coordination of care plan ?  ?Heath Lark, MD ?11/03/2021 1:05 PM ? ?INTERVAL HISTORY: ?Please see below for problem oriented charting. ?he returns for surveillance follow-up due to history of multiple myeloma ?He is doing well ?No new bone pain or infection ?No recent dental  issues ?He follows closely with urologist due to prostate cancer ?He had recent shortness of breath and is undergoing evaluation with echocardiogram ? ?REVIEW OF SYSTEMS:   ?Constitutional: Denies fevers, chills or abnormal weight loss ?Eyes: Denies blurriness of vision ?Ears, nose, mouth, throat, and face: Denies mucositis or sore throat ?Cardiovascular: Denies palpitation, chest discomfort or lower extremity swelling ?Gastrointestinal:  Denies nausea, heartburn or change in bowel habits ?Skin: Denies abnormal skin rashes ?Lymphatics: Denies new lymphadenopathy or easy bruising ?Neurological:Denies numbness, tingling or new weaknesses ?Behavioral/Psych: Mood is stable, no new changes  ?All other systems were reviewed with the patient and are negative. ? ?I have reviewed the past medical history, past surgical history, social history and family history with the patient and they are unchanged from previous note. ? ?ALLERGIES:  is allergic to celebrex [celecoxib], cymbalta [duloxetine hcl], hydromorphone hcl, meloxicam, oxycontin [oxycodone], percocet [oxycodone-acetaminophen], vicodin [hydrocodone-acetaminophen], and lyrica [pregabalin]. ? ?MEDICATIONS:  ?Current Outpatient Medications  ?Medication Sig Dispense Refill  ? aspirin 81 MG tablet Take 81 mg by mouth daily.    ? B Complex Vitamins (VITAMIN-B COMPLEX) TABS Take by mouth.    ? Calcium Carbonate-Vitamin D 600-400 MG-UNIT per tablet Take by mouth.    ? cholecalciferol (VITAMIN D) 1000 UNITS tablet Take 1,000 Units by mouth daily.    ? diclofenac Sodium (VOLTAREN) 1 % GEL Apply 4 g topically 4 (four) times daily. 150 g 1  ? fentaNYL (DURAGESIC) 75 MCG/HR Place onto the skin.    ? lidocaine-prilocaine (EMLA) cream Apply topically as needed. Apply to port site one hour before treatment and cover with plastic wrap 30 g 2  ? mupirocin ointment (BACTROBAN) 2 %  Apply 1 application topically 2 (two) times daily. 22 g 6  ? NONFORMULARY OR COMPOUNDED ITEM  Baclofn/amitrp/ketamn cream topically to feet TID prn for neuropathy    ? NUBEQA 300 MG tablet Take 600 mg by mouth 2 (two) times daily.    ? oxyCODONE (OXY IR/ROXICODONE) 5 MG immediate release tablet TK 1 T PO Q 4 H PRN P  0  ? pantoprazole (PROTONIX) 40 MG tablet TAKE 1 TABLET BY MOUTH EVERY DAY 90 tablet 3  ? ?No current facility-administered medications for this visit.  ? ? ?SUMMARY OF ONCOLOGIC HISTORY: ?Oncology History  ?Malignant neoplasm of prostate ([place])  ?02/23/2007 Initial Diagnosis  ? Malignant neoplasm of prostate [facility]) ?  ?10/28/2020 Cancer Staging  ? Staging form: Prostate, AJCC 6th Edition ?- Clinical: Stage III (T3, N0, M0) - Signed by Heath Lark, MD on 10/28/2020 ? ?  ? ? ?PHYSICAL EXAMINATION: ?ECOG PERFORMANCE STATUS: 0 - Asymptomatic ? ?Vitals:  ? 11/03/21 1121  ?BP: 139/75  ?Pulse: 77  ?Resp: 18  ?Temp: (!) 97.4 ?F (36.3 ?C)  ?SpO2: 100%  ? ?Filed Weights  ? 11/03/21 1121  ?Weight: 188 lb 6.4 oz (85.5 kg)  ? ? ?GENERAL:alert, no distress and comfortable ?SKIN: skin color, texture, turgor are normal, no rashes or significant lesions ?EYES: normal, Conjunctiva are pink and non-injected, sclera clear ?OROPHARYNX:no exudate, no erythema and lips, buccal mucosa, and tongue normal  ?NECK: supple, thyroid normal size, non-tender, without nodularity ?LYMPH:  no palpable lymphadenopathy in the cervical, axillary or inguinal ?LUNGS: clear to auscultation and percussion with normal breathing effort ?HEART: regular rate & rhythm and no murmurs and no lower extremity edema ?ABDOMEN:abdomen soft, non-tender and normal bowel sounds ?Musculoskeletal:no cyanosis of digits and no clubbing  ?NEURO: alert & oriented x 3 with fluent speech, no focal motor/sensory deficits ? ?LABORATORY DATA:  ?I have reviewed the data as listed ?   ?Component Value Date/Time  ? NA 138 11/03/2021 1101  ? NA 139 05/14/2017 1145  ? K 4.0 11/03/2021 1101  ? K 3.8 05/14/2017 1145  ? CL 104 11/03/2021 1101  ? CL 108 (H) 09/30/2013 0945  ?  CL 110 (H) 12/29/2012 1151  ? CO2 28 11/03/2021 1101  ? CO2 27 05/14/2017 1145  ? GLUCOSE 98 11/03/2021 1101  ? GLUCOSE 109 05/14/2017 1145  ? GLUCOSE 127 (H) 12/29/2012 1151  ? BUN 21 11/03/2021 1101  ? BUN 17.9 05/14/2017 1145  ? CREATININE 1.28 (H) 11/03/2021 1101  ? CREATININE 1.35 (H) 05/09/2021 1122  ? CREATININE 1.3 05/14/2017 1145  ? CALCIUM 9.2 11/03/2021 1101  ? CALCIUM 9.3 05/14/2017 1145  ? PROT 7.1 11/03/2021 1101  ? PROT 6.2 05/14/2017 1145  ? PROT 6.6 05/14/2017 1145  ? ALBUMIN 3.6 11/03/2021 1101  ? ALBUMIN 3.3 (L) 05/14/2017 1145  ? AST 18 11/03/2021 1101  ? AST 22 05/09/2021 1122  ? AST 19 05/14/2017 1145  ? ALT 11 11/03/2021 1101  ? ALT 12 05/09/2021 1122  ? ALT 18 05/14/2017 1145  ? ALKPHOS 58 11/03/2021 1101  ? ALKPHOS 45 05/14/2017 1145  ? BILITOT 0.6 11/03/2021 1101  ? BILITOT 0.7 05/09/2021 1122  ? BILITOT 0.67 05/14/2017 1145  ? GFRNONAA 58 (L) 11/03/2021 1101  ? GFRNONAA 55 (L) 05/09/2021 1122  ? GFRNONAA >60 09/30/2013 0945  ? GFRAA 59 (L) 04/05/2020 1031  ? GFRAA >60 09/30/2013 0945  ? ? ?No results found for: SPEP, UPEP ? ?Lab Results  ?Component Value Date  ? WBC 6.9 11/03/2021  ?  NEUTROABS 4.7 11/03/2021  ? HGB 12.2 (L) 11/03/2021  ? HCT 36.3 (L) 11/03/2021  ? MCV 98.6 11/03/2021  ? PLT 216 11/03/2021  ? ? ?  Chemistry   ?   ?Component Value Date/Time  ? NA 138 11/03/2021 1101  ? NA 139 05/14/2017 1145  ? K 4.0 11/03/2021 1101  ? K 3.8 05/14/2017 1145  ? CL 104 11/03/2021 1101  ? CL 108 (H) 09/30/2013 0945  ? CL 110 (H) 12/29/2012 1151  ? CO2 28 11/03/2021 1101  ? CO2 27 05/14/2017 1145  ? BUN 21 11/03/2021 1101  ? BUN 17.9 05/14/2017 1145  ? CREATININE 1.28 (H) 11/03/2021 1101  ? CREATININE 1.35 (H) 05/09/2021 1122  ? CREATININE 1.3 05/14/2017 1145  ?    ?Component Value Date/Time  ? CALCIUM 9.2 11/03/2021 1101  ? CALCIUM 9.3 05/14/2017 1145  ? ALKPHOS 58 11/03/2021 1101  ? ALKPHOS 45 05/14/2017 1145  ? AST 18 11/03/2021 1101  ? AST 22 05/09/2021 1122  ? AST 19 05/14/2017 1145  ? ALT  11 11/03/2021 1101  ? ALT 12 05/09/2021 1122  ? ALT 18 05/14/2017 1145  ? BILITOT 0.6 11/03/2021 1101  ? BILITOT 0.7 05/09/2021 1122  ? BILITOT 0.67 05/14/2017 1145  ?  ? ?

## 2021-11-03 NOTE — Assessment & Plan Note (Signed)
According to the patient, his PSA was recently elevated ?I would defer to urologist for management ?

## 2021-11-03 NOTE — Assessment & Plan Note (Signed)
He is known to have intermittent elevated serum creatinine ?This is not due to myeloma ?We discussed importance of adequate hydration ?

## 2021-11-04 ENCOUNTER — Telehealth: Payer: Self-pay | Admitting: Family Medicine

## 2021-11-04 LAB — ECHOCARDIOGRAM COMPLETE
AR max vel: 1.64 cm2
AV Area VTI: 1.76 cm2
AV Area mean vel: 1.61 cm2
AV Mean grad: 8 mmHg
AV Peak grad: 15.1 mmHg
Ao pk vel: 1.94 m/s
Area-P 1/2: 1.79 cm2
S' Lateral: 2.9 cm

## 2021-11-04 LAB — KAPPA/LAMBDA LIGHT CHAINS
Kappa free light chain: 47.6 mg/L — ABNORMAL HIGH (ref 3.3–19.4)
Kappa, lambda light chain ratio: 1.5 (ref 0.26–1.65)
Lambda free light chains: 31.8 mg/L — ABNORMAL HIGH (ref 5.7–26.3)

## 2021-11-04 NOTE — Telephone Encounter (Signed)
Placed in Dr Rometta Emery inbox ?

## 2021-11-04 NOTE — Telephone Encounter (Signed)
Patient came in with results/medication. Wants Dr. Diona Browner to look at them and communicate back with him. I will put it in your box, thanks.  ?

## 2021-11-05 LAB — MULTIPLE MYELOMA PANEL, SERUM
Albumin SerPl Elph-Mcnc: 3.2 g/dL (ref 2.9–4.4)
Albumin/Glob SerPl: 1 (ref 0.7–1.7)
Alpha 1: 0.3 g/dL (ref 0.0–0.4)
Alpha2 Glob SerPl Elph-Mcnc: 0.9 g/dL (ref 0.4–1.0)
B-Globulin SerPl Elph-Mcnc: 0.9 g/dL (ref 0.7–1.3)
Gamma Glob SerPl Elph-Mcnc: 1.2 g/dL (ref 0.4–1.8)
Globulin, Total: 3.3 g/dL (ref 2.2–3.9)
IgA: 216 mg/dL (ref 61–437)
IgG (Immunoglobin G), Serum: 1200 mg/dL (ref 603–1613)
IgM (Immunoglobulin M), Srm: 30 mg/dL (ref 15–143)
Total Protein ELP: 6.5 g/dL (ref 6.0–8.5)

## 2021-11-07 ENCOUNTER — Telehealth: Payer: Self-pay

## 2021-11-07 NOTE — Telephone Encounter (Signed)
-----   Message from Heath Lark, MD sent at 11/07/2021  8:41 AM EDT ----- ?Pls let him know he is still in remission from myeloma ? ?

## 2021-11-07 NOTE — Telephone Encounter (Signed)
Called and given below message. He verbalized understanding. 

## 2021-11-14 ENCOUNTER — Other Ambulatory Visit: Payer: Self-pay | Admitting: Nurse Practitioner

## 2021-11-19 ENCOUNTER — Telehealth: Payer: Self-pay

## 2021-11-19 ENCOUNTER — Ambulatory Visit (INDEPENDENT_AMBULATORY_CARE_PROVIDER_SITE_OTHER): Payer: Medicare Other | Admitting: Podiatry

## 2021-11-19 DIAGNOSIS — M79676 Pain in unspecified toe(s): Secondary | ICD-10-CM

## 2021-11-19 DIAGNOSIS — B351 Tinea unguium: Secondary | ICD-10-CM

## 2021-11-19 NOTE — Telephone Encounter (Signed)
Pt called with concerns regarding CXR results per Junius Finner, NP. Advised pt that MD is not in office today and we would be in touch after MD has a chance to speak with Waters. Pt verbalized thanks and understanding. ?

## 2021-11-19 NOTE — Progress Notes (Signed)
He presents today chief complaint of painful elongated toenails 1 through 5 of the right foot.  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 ? ?Objective: Vital signs stable he is alert oriented x3 left foot demonstrates completely healed wounds is doing very well.  Right foot demonstrates palpable pulses toenails are long thick yellow dystrophic onychomycotic. ? ?Assessment: Pain in limb secondary to onychomycosis 1 through 5 right foot. ? ?Plan: Debridement of toenails 1 through 5 right foot.  Follow-up with him in 3 months. ?

## 2021-11-20 ENCOUNTER — Encounter: Payer: Self-pay | Admitting: Hematology and Oncology

## 2021-11-20 ENCOUNTER — Inpatient Hospital Stay (HOSPITAL_BASED_OUTPATIENT_CLINIC_OR_DEPARTMENT_OTHER): Payer: Medicare Other | Admitting: Hematology and Oncology

## 2021-11-20 ENCOUNTER — Telehealth: Payer: Self-pay

## 2021-11-20 ENCOUNTER — Other Ambulatory Visit: Payer: Self-pay

## 2021-11-20 VITALS — BP 154/83 | HR 83 | Resp 18 | Ht 71.5 in | Wt 178.8 lb

## 2021-11-20 DIAGNOSIS — C61 Malignant neoplasm of prostate: Secondary | ICD-10-CM

## 2021-11-20 DIAGNOSIS — C9002 Multiple myeloma in relapse: Secondary | ICD-10-CM

## 2021-11-20 DIAGNOSIS — J90 Pleural effusion, not elsewhere classified: Secondary | ICD-10-CM

## 2021-11-20 NOTE — Progress Notes (Signed)
[place] ?OFFICE PROGRESS NOTE ? ?Patient Care Team: ?Jinny Sanders, MD as PCP - General ?Jeanann Lewandowsky, MD as Consulting Physician (Hematology and Oncology) ?Cathlean Marseilles, NP as Nurse Practitioner (Hematology and Oncology) ?Raynelle Bring, MD as Consulting Physician (Urology) ?Heath Lark, MD as Consulting Physician (Hematology and Oncology) ?Hyatt, Max T, DPM as Veterinary surgeon) ?Waters, Howell Rucks, NP as Nurse Practitioner (Pain Medicine) ?Druscilla Brownie, MD as Consulting Physician (Dermatology) ?Bryson Ha, OD as Consulting Physician (Optometry) ? ?ASSESSMENT & PLAN:  ?Pleural effusion, left ?I spoke with Dr. Morton Stall from [facility] and review documentation on epic ?Chest x-ray performed on 11/17/2021 showed evidence of opacity on the left lung along with moderate left pleural effusion ?His recent myeloma panel showed no evidence of recurrence ?I am concerned that this might represent malignant pleural effusion from metastatic prostate cancer ?He is symptomatic but his vitals are stable ?I will order both diagnostic and therapeutic thoracentesis tomorrow ?I will order PET/CT imaging to assess for disease from the myeloma standpoint as well as from the prostate cancer standpoint ?I will see him after PET/CT imaging results for next ? ?Multiple myeloma in relapse [facility]) ?His recent myeloma panel was normal but it is possible he might have disease elsewhere ?I recommend PET/CT imaging for staging and he is in agreement ? ?Malignant neoplasm of prostate ([place]) ?He is being treated by urologist and was told his PSA has been rising for some time ?As above, we will stage him ?He may need to start chemotherapy if proven to have metastatic prostate cancer ? ?Orders Placed This Encounter  ?Procedures  ? US Thoracentesis Asp Pleural space w/IMG guide  ?  Standing Status:   Future  ?  Standing Expiration Date:   11/20/2022  ?  Order Specific Question:   Are labs required for specimen  collection?  ?  Answer:   Yes  ?  Order Specific Question:   Lab orders requested (DO NOT place separate lab orders, these will be automatically ordered during procedure specimen collection):  ?  Answer:   Cytology - Non Pap  ?  Order Specific Question:   Reason for Exam (SYMPTOM  OR DIAGNOSIS REQUIRED)  ?  Answer:   suspect malignant effusion, prostate cancer and multiple myeloma  ?  Order Specific Question:   Preferred imaging location?  ?  Answer:   [facility]  ? NM PET Image Restage (PS) Whole Body  ?  Multiple myeloma and prostate cancer  ?  Standing Status:   Future  ?  Standing Expiration Date:   11/20/2022  ?  Order Specific Question:   If indicated for the ordered procedure, I authorize the administration of a radiopharmaceutical per Radiology protocol  ?  Answer:   Yes  ?  Order Specific Question:   Preferred imaging location?  ?  Answer:   [facility]  ? ? ?All questions were answered. The patient knows to call the clinic with any problems, questions or concerns. ?The total time spent in the appointment was 40 minutes encounter with patients including review of chart and various tests results, discussions about plan of care and coordination of care plan ?  ?Heath Lark, MD ?11/20/2021 4:51 PM ? ?INTERVAL HISTORY: ?Please see below for problem oriented charting. ?he returns for urgent evaluation ?I received a telephone call from his pain management physician from [place] ?He was recently seen there and an x-ray of the chest was ordered due to his complaint of persistent  shortness of breath ?He had recent evaluation with echocardiogram which rule out cardiomyopathy as a cause of his shortness of breath ?The chest x-ray from [place] showed moderate pleural effusion ?Due to his cancer history, I spoke with the physician there about his case and the patient is brought in for further evaluation today ?He denies recent fever or chills ?No major cough but does have shortness of breath on minimal  exertion ? ?REVIEW OF SYSTEMS:   ?Constitutional: Denies fevers, chills or abnormal weight loss ?Eyes: Denies blurriness of vision ?Ears, nose, mouth, throat, and face: Denies mucositis or sore throat ?Cardiovascular: Denies palpitation, chest discomfort or lower extremity swelling ?Gastrointestinal:  Denies nausea, heartburn or change in bowel habits ?Skin: Denies abnormal skin rashes ?Lymphatics: Denies new lymphadenopathy or easy bruising ?Neurological:Denies numbness, tingling or new weaknesses ?Behavioral/Psych: Mood is stable, no new changes  ?All other systems were reviewed with the patient and are negative. ? ?I have reviewed the past medical history, past surgical history, social history and family history with the patient and they are unchanged from previous note. ? ?ALLERGIES:  is allergic to celebrex [celecoxib], cymbalta [duloxetine hcl], hydromorphone hcl, meloxicam, oxycontin [oxycodone], percocet [oxycodone-acetaminophen], vicodin [hydrocodone-acetaminophen], and lyrica [pregabalin]. ? ?MEDICATIONS:  ?Current Outpatient Medications  ?Medication Sig Dispense Refill  ? aspirin 81 MG tablet Take 81 mg by mouth daily.    ? B Complex Vitamins (VITAMIN-B COMPLEX) TABS Take by mouth.    ? Calcium Carbonate-Vitamin D 600-400 MG-UNIT per tablet Take by mouth.    ? cholecalciferol (VITAMIN D) 1000 UNITS tablet Take 1,000 Units by mouth daily.    ? diclofenac Sodium (VOLTAREN) 1 % GEL Apply 4 g topically 4 (four) times daily. 150 g 1  ? lidocaine-prilocaine (EMLA) cream Apply topically as needed. Apply to port site one hour before treatment and cover with plastic wrap 30 g 2  ? mupirocin ointment (BACTROBAN) 2 % Apply 1 application topically 2 (two) times daily. 22 g 6  ? NONFORMULARY OR COMPOUNDED ITEM Baclofn/amitrp/ketamn cream topically to feet TID prn for neuropathy    ? NUBEQA 300 MG tablet Take 600 mg by mouth 2 (two) times daily.    ? oxyCODONE (OXY IR/ROXICODONE) 5 MG immediate release tablet TK 1 T PO  Q 4 H PRN P  0  ? pantoprazole (PROTONIX) 40 MG tablet TAKE 1 TABLET BY MOUTH EVERY DAY 90 tablet 3  ? ?No current facility-administered medications for this visit.  ? ? ?SUMMARY OF ONCOLOGIC HISTORY: ?Oncology History  ?Malignant neoplasm of prostate ([place])  ?02/23/2007 Initial Diagnosis  ? Malignant neoplasm of prostate ([place]) ? ?  ?10/28/2020 Cancer Staging  ? Staging form: Prostate, AJCC 6th Edition ?- Clinical: Stage III (T3, N0, M0) - Signed by Heath Lark, MD on 10/28/2020 ? ?  ? ? ?PHYSICAL EXAMINATION: ?ECOG PERFORMANCE STATUS: 1 - Symptomatic but completely ambulatory ? ?Vitals:  ? 11/20/21 1111  ?BP: (!) 154/83  ?Pulse: 83  ?Resp: 18  ?SpO2: 99%  ? ?Filed Weights  ? 11/20/21 1111  ?Weight: 178 lb 12.8 oz (81.1 kg)  ? ? ?GENERAL:alert, no distress and comfortable ?SKIN: skin color, texture, turgor are normal, no rashes or significant lesions ?EYES: normal, Conjunctiva are pink and non-injected, sclera clear ?OROPHARYNX:no exudate, no erythema and lips, buccal mucosa, and tongue normal  ?NECK: supple, thyroid normal size, non-tender, without nodularity ?LYMPH:  no palpable lymphadenopathy in the cervical, axillary or inguinal ?LUNGS: He has mildly reduced breath sound on the left lung base ?HEART: regular  rate & rhythm and no murmurs and no lower extremity edema ?ABDOMEN:abdomen soft, non-tender and normal bowel sounds ?Musculoskeletal:no cyanosis of digits and no clubbing  ?NEURO: alert & oriented x 3 with fluent speech, no focal motor/sensory deficits ? ?LABORATORY DATA:  ?I have reviewed the data as listed ?   ?Component Value Date/Time  ? NA 138 11/03/2021 1101  ? NA 139 05/14/2017 1145  ? K 4.0 11/03/2021 1101  ? K 3.8 05/14/2017 1145  ? CL 104 11/03/2021 1101  ? CL 108 (H) 09/30/2013 0945  ? CL 110 (H) 12/29/2012 1151  ? CO2 28 11/03/2021 1101  ? CO2 27 05/14/2017 1145  ? GLUCOSE 98 11/03/2021 1101  ? GLUCOSE 109 05/14/2017 1145  ? GLUCOSE 127 (H) 12/29/2012 1151  ? BUN 21 11/03/2021 1101  ? BUN 17.9  05/14/2017 1145  ? CREATININE 1.28 (H) 11/03/2021 1101  ? CREATININE 1.35 (H) 05/09/2021 1122  ? CREATININE 1.3 05/14/2017 1145  ? CALCIUM 9.2 11/03/2021 1101  ? CALCIUM 9.3 05/14/2017 1145  ? PROT 7.1 11/03/2021 1101  ? P

## 2021-11-20 NOTE — Telephone Encounter (Signed)
Returned his call. PET is scheduled on 5/8, he just wanted Dr. Alvy Bimler to be aware. ?

## 2021-11-20 NOTE — Assessment & Plan Note (Signed)
I spoke with Dr. Morton Stall from [facility] and review documentation on epic ?Chest x-ray performed on 11/17/2021 showed evidence of opacity on the left lung along with moderate left pleural effusion ?His recent myeloma panel showed no evidence of recurrence ?I am concerned that this might represent malignant pleural effusion from metastatic prostate cancer ?He is symptomatic but his vitals are stable ?I will order both diagnostic and therapeutic thoracentesis tomorrow ?I will order PET/CT imaging to assess for disease from the myeloma standpoint as well as from the prostate cancer standpoint ?I will see him after PET/CT imaging results for next ?

## 2021-11-20 NOTE — Assessment & Plan Note (Signed)
He is being treated by urologist and was told his PSA has been rising for some time ?As above, we will stage him ?He may need to start chemotherapy if proven to have metastatic prostate cancer ?

## 2021-11-20 NOTE — Telephone Encounter (Signed)
Called and scheduled appt at 11 am today. He is aware of appt date/time. ?

## 2021-11-20 NOTE — Assessment & Plan Note (Signed)
His recent myeloma panel was normal but it is possible he might have disease elsewhere ?I recommend PET/CT imaging for staging and he is in agreement ?

## 2021-11-20 NOTE — Telephone Encounter (Signed)
I will put LOS for return visit ?

## 2021-11-21 ENCOUNTER — Ambulatory Visit (HOSPITAL_COMMUNITY)
Admission: RE | Admit: 2021-11-21 | Discharge: 2021-11-21 | Disposition: A | Discharge status: Home / Self Care | Payer: Medicare Other | Source: Ambulatory Visit | Attending: Hematology and Oncology | Admitting: Hematology and Oncology

## 2021-11-21 ENCOUNTER — Ambulatory Visit (HOSPITAL_COMMUNITY)
Admission: RE | Admit: 2021-11-21 | Discharge: 2021-11-21 | Disposition: A | Discharge status: Home / Self Care | Payer: Medicare Other | Source: Ambulatory Visit | Attending: Radiology | Admitting: Radiology

## 2021-11-21 DIAGNOSIS — Z8546 Personal history of malignant neoplasm of prostate: Secondary | ICD-10-CM | POA: Diagnosis not present

## 2021-11-21 DIAGNOSIS — C9001 Multiple myeloma in remission: Secondary | ICD-10-CM | POA: Insufficient documentation

## 2021-11-21 DIAGNOSIS — J90 Pleural effusion, not elsewhere classified: Secondary | ICD-10-CM | POA: Diagnosis present

## 2021-11-21 MED ORDER — LIDOCAINE HCL 1 % IJ SOLN
INTRAMUSCULAR | Status: AC
  Administered 2021-11-21: 15 mL
  Filled 2021-11-21: qty 20

## 2021-11-21 NOTE — Procedures (Addendum)
Ultrasound-guided diagnostic and therapeutic left thoracentesis performed yielding 750 cc of hazy, amber fluid. No immediate complications. Follow-up chest x-ray pending. The fluid was sent to the lab for preordered studies. EBL < 2 cc.  Postprocedure US revealed no sig residual free fluid.      ? ?

## 2021-11-24 LAB — CYTOLOGY - NON PAP

## 2021-12-01 ENCOUNTER — Encounter (HOSPITAL_COMMUNITY)
Admission: RE | Admit: 2021-12-01 | Discharge: 2021-12-01 | Disposition: A | Discharge status: Home / Self Care | Payer: Medicare Other | Source: Ambulatory Visit | Attending: Hematology and Oncology | Admitting: Hematology and Oncology

## 2021-12-01 DIAGNOSIS — J90 Pleural effusion, not elsewhere classified: Secondary | ICD-10-CM | POA: Insufficient documentation

## 2021-12-01 DIAGNOSIS — C61 Malignant neoplasm of prostate: Secondary | ICD-10-CM | POA: Insufficient documentation

## 2021-12-01 LAB — GLUCOSE, CAPILLARY: Glucose-Capillary: 104 mg/dL — ABNORMAL HIGH (ref 70–99)

## 2021-12-01 MED ORDER — FLUDEOXYGLUCOSE F - 18 (FDG) INJECTION
8.9000 | Freq: Once | INTRAVENOUS | Status: AC
  Administered 2021-12-01: 8.83 via INTRAVENOUS

## 2021-12-04 ENCOUNTER — Encounter: Payer: Self-pay | Admitting: Hematology and Oncology

## 2021-12-04 ENCOUNTER — Encounter: Payer: Self-pay | Admitting: *Deleted

## 2021-12-04 ENCOUNTER — Other Ambulatory Visit: Payer: Self-pay

## 2021-12-04 ENCOUNTER — Inpatient Hospital Stay: Payer: Medicare Other | Attending: Hematology and Oncology | Admitting: Hematology and Oncology

## 2021-12-04 DIAGNOSIS — J910 Malignant pleural effusion: Secondary | ICD-10-CM | POA: Diagnosis not present

## 2021-12-04 DIAGNOSIS — C9002 Multiple myeloma in relapse: Secondary | ICD-10-CM

## 2021-12-04 DIAGNOSIS — J90 Pleural effusion, not elsewhere classified: Secondary | ICD-10-CM | POA: Diagnosis not present

## 2021-12-04 DIAGNOSIS — J948 Other specified pleural conditions: Secondary | ICD-10-CM

## 2021-12-04 DIAGNOSIS — J949 Pleural condition, unspecified: Secondary | ICD-10-CM | POA: Diagnosis not present

## 2021-12-04 DIAGNOSIS — C61 Malignant neoplasm of prostate: Secondary | ICD-10-CM

## 2021-12-04 DIAGNOSIS — M8580 Other specified disorders of bone density and structure, unspecified site: Secondary | ICD-10-CM | POA: Diagnosis not present

## 2021-12-04 NOTE — Assessment & Plan Note (Signed)
His recent myeloma panel showed no evidence of recurrent disease ?He is scheduled for Zometa next month ?

## 2021-12-04 NOTE — Assessment & Plan Note (Addendum)
He has residual pleural fluid on the left lung but not significant ?For now, I do not recommend repeat thoracentesis ?Recent thoracentesis did not yield malignant cytology ?

## 2021-12-04 NOTE — Progress Notes (Unsigned)
Aletta Edouard, MD  [facility] ?OK for CT guided biopsy of left pleural mass, anterior/anterolateral on PET.  ? ?GY   ?  ?  ? ?

## 2021-12-04 NOTE — Progress Notes (Signed)
[place] ?OFFICE PROGRESS NOTE ? ?Patient Care Team: ?Jinny Sanders, MD as PCP - General ?Jeanann Lewandowsky, MD as Consulting Physician (Hematology and Oncology) ?Cathlean Marseilles, NP as Nurse Practitioner (Hematology and Oncology) ?Raynelle Bring, MD as Consulting Physician (Urology) ?Heath Lark, MD as Consulting Physician (Hematology and Oncology) ?Hyatt, Max T, DPM as Veterinary surgeon) ?Waters, Howell Rucks, NP as Nurse Practitioner (Pain Medicine) ?Druscilla Brownie, MD as Consulting Physician (Dermatology) ?Bryson Ha, OD as Consulting Physician (Optometry) ? ?ASSESSMENT & PLAN:  ?Pleural mass ?I have reviewed multiple PET CT imaging with the patient ?The most recent PET CT scan was compared with prior imaging from 2021 ?Retrospectively looking, the patient probably have a small lung nodule on the left that has since grown ?He has signs of malignant pleural effusion/pleural mass as well as lymphadenopathy in the left hilum, most consistent with probable undiagnosed non-small cell lung cancer ?I do not believe this represent metastatic prostate cancer ?We discussed the importance of biopsy ?I spoke with interventional radiologist who felt that CT-guided left pleural mass would be most appropriate ?I will put the order ?As soon as the biopsy is performed, I will bring him back within 3 to 5 days to review test results ? ?Pleural effusion, left ?He has residual pleural fluid on the left lung but not significant ?For now, I do not recommend repeat thoracentesis ?Recent thoracentesis did not yield malignant cytology ? ?Malignant neoplasm of prostate ([place]) ?He has appointment to see Dr. Alinda Money next month for follow-up ?He will continue follow-up as directed ? ?Multiple myeloma in relapse [facility]) ?His recent myeloma panel showed no evidence of recurrent disease ?He is scheduled for Zometa next month ? ?Orders Placed This Encounter  ?Procedures  ? CT GUIDED NEEDLE PLACEMENT  ?   Standing Status:   Future  ?  Standing Expiration Date:   12/05/2022  ?  Order Specific Question:   If indicated for the ordered procedure, I authorize the administration of contrast media per Radiology protocol  ?  Answer:   Yes  ?  Order Specific Question:   Reason for Exam (SYMPTOM  OR DIAGNOSIS REQUIRED)  ?  Answer:   D/W Dr. Kathlene Cote, recommend CT guided biopsy of L pleural mass  ?  Order Specific Question:   Preferred imaging location?  ?  Answer:   [facility]  ? ? ?All questions were answered. The patient knows to call the clinic with any problems, questions or concerns. ?The total time spent in the appointment was 40 minutes encounter with patients including review of chart and various tests results, discussions about plan of care and coordination of care plan ?  ?Heath Lark, MD ?12/04/2021 1:28 PM ? ?INTERVAL HISTORY: ?Please see below for problem oriented charting. ?he returns for review of test results ?He did not have any symptomatic improvement since recent thoracentesis ?Denies recent cough ?He denies pain ?He had numerous questions related to recent PET/CT imaging ? ?REVIEW OF SYSTEMS:   ?Constitutional: Denies fevers, chills or abnormal weight loss ?Eyes: Denies blurriness of vision ?Ears, nose, mouth, throat, and face: Denies mucositis or sore throat ?Cardiovascular: Denies palpitation, chest discomfort or lower extremity swelling ?Gastrointestinal:  Denies nausea, heartburn or change in bowel habits ?Skin: Denies abnormal skin rashes ?Lymphatics: Denies new lymphadenopathy or easy bruising ?Neurological:Denies numbness, tingling or new weaknesses ?Behavioral/Psych: Mood is stable, no new changes  ?All other systems were reviewed with the patient and are negative. ? ?I have reviewed the past  medical history, past surgical history, social history and family history with the patient and they are unchanged from previous note. ? ?ALLERGIES:  is allergic to celebrex [celecoxib], cymbalta  [duloxetine hcl], hydromorphone hcl, meloxicam, oxycontin [oxycodone], percocet [oxycodone-acetaminophen], vicodin [hydrocodone-acetaminophen], and lyrica [pregabalin]. ? ?MEDICATIONS:  ?Current Outpatient Medications  ?Medication Sig Dispense Refill  ? aspirin 81 MG tablet Take 81 mg by mouth daily.    ? B Complex Vitamins (VITAMIN-B COMPLEX) TABS Take by mouth.    ? Calcium Carbonate-Vitamin D 600-400 MG-UNIT per tablet Take by mouth.    ? cholecalciferol (VITAMIN D) 1000 UNITS tablet Take 1,000 Units by mouth daily.    ? diclofenac Sodium (VOLTAREN) 1 % GEL Apply 4 g topically 4 (four) times daily. 150 g 1  ? lidocaine-prilocaine (EMLA) cream Apply topically as needed. Apply to port site one hour before treatment and cover with plastic wrap 30 g 2  ? mupirocin ointment (BACTROBAN) 2 % Apply 1 application topically 2 (two) times daily. 22 g 6  ? NONFORMULARY OR COMPOUNDED ITEM Baclofn/amitrp/ketamn cream topically to feet TID prn for neuropathy    ? NUBEQA 300 MG tablet Take 600 mg by mouth 2 (two) times daily.    ? oxyCODONE (OXY IR/ROXICODONE) 5 MG immediate release tablet TK 1 T PO Q 4 H PRN P  0  ? pantoprazole (PROTONIX) 40 MG tablet TAKE 1 TABLET BY MOUTH EVERY DAY 90 tablet 3  ? ?No current facility-administered medications for this visit.  ? ? ?SUMMARY OF ONCOLOGIC HISTORY: ?Oncology History  ?Malignant neoplasm of prostate ([place])  ?02/23/2007 Initial Diagnosis  ? Malignant neoplasm of prostate ([place]) ? ?  ?10/28/2020 Cancer Staging  ? Staging form: Prostate, AJCC 6th Edition ?- Clinical: Stage III (T3, N0, M0) - Signed by Heath Lark, MD on 10/28/2020 ? ?  ?11/24/2021 Procedure  ? Successful ultrasound guided diagnostic and therapeutic left thoracentesis yielding 750 cc of pleural fluid. ?  ?  ?12/02/2021 PET scan  ? 1. Hypermetabolic nodular areas of consolidation in the left lung with a hypermetabolic malignant left pleural effusion. Hypermetabolism extends inferiorly along the left hemidiaphragm. Associated  hypermetabolic prepericardiac lymph nodes. Findings may be metastatic. Primary bronchogenic carcinoma cannot be excluded. ?2. Uptake in the soft tissues of the distal left foot with associated edema in the soft tissues. Please correlate clinically. ?3. Cholelithiasis and choledocholithiasis. No biliary ductal dilatation. ?4. Bilateral renal stones. ?5. Aortic atherosclerosis (ICD10-I70.0). Coronary artery calcification. ?  ?  ? ? ?PHYSICAL EXAMINATION: ?ECOG PERFORMANCE STATUS: 1 - Symptomatic but completely ambulatory ? ?Vitals:  ? 12/04/21 1254  ?BP: (!) 142/73  ?Pulse: 90  ?Resp: 18  ?Temp: 98.9 ?F (37.2 ?C)  ?SpO2: 99%  ? ?Filed Weights  ? 12/04/21 1254  ?Weight: 184 lb 6.4 oz (83.6 kg)  ? ? ?GENERAL:alert, no distress and comfortable ?NEURO: alert & oriented x 3 with fluent speech, no focal motor/sensory deficits ? ?LABORATORY DATA:  ?I have reviewed the data as listed ?   ?Component Value Date/Time  ? NA 138 11/03/2021 1101  ? NA 139 05/14/2017 1145  ? K 4.0 11/03/2021 1101  ? K 3.8 05/14/2017 1145  ? CL 104 11/03/2021 1101  ? CL 108 (H) 09/30/2013 0945  ? CL 110 (H) 12/29/2012 1151  ? CO2 28 11/03/2021 1101  ? CO2 27 05/14/2017 1145  ? GLUCOSE 98 11/03/2021 1101  ? GLUCOSE 109 05/14/2017 1145  ? GLUCOSE 127 (H) 12/29/2012 1151  ? BUN 21 11/03/2021 1101  ? BUN  17.9 05/14/2017 1145  ? CREATININE 1.28 (H) 11/03/2021 1101  ? CREATININE 1.35 (H) 05/09/2021 1122  ? CREATININE 1.3 05/14/2017 1145  ? CALCIUM 9.2 11/03/2021 1101  ? CALCIUM 9.3 05/14/2017 1145  ? PROT 7.1 11/03/2021 1101  ? PROT 6.2 05/14/2017 1145  ? PROT 6.6 05/14/2017 1145  ? ALBUMIN 3.6 11/03/2021 1101  ? ALBUMIN 3.3 (L) 05/14/2017 1145  ? AST 18 11/03/2021 1101  ? AST 22 05/09/2021 1122  ? AST 19 05/14/2017 1145  ? ALT 11 11/03/2021 1101  ? ALT 12 05/09/2021 1122  ? ALT 18 05/14/2017 1145  ? ALKPHOS 58 11/03/2021 1101  ? ALKPHOS 45 05/14/2017 1145  ? BILITOT 0.6 11/03/2021 1101  ? BILITOT 0.7 05/09/2021 1122  ? BILITOT 0.67 05/14/2017 1145  ?  GFRNONAA 58 (L) 11/03/2021 1101  ? GFRNONAA 55 (L) 05/09/2021 1122  ? GFRNONAA >60 09/30/2013 0945  ? GFRAA 59 (L) 04/05/2020 1031  ? GFRAA >60 09/30/2013 0945  ? ? ?No results found for: SPEP, UPEP ? ?Lab Results  ?[place]

## 2021-12-04 NOTE — Assessment & Plan Note (Signed)
I have reviewed multiple PET CT imaging with the patient ?The most recent PET CT scan was compared with prior imaging from 2021 ?Retrospectively looking, the patient probably have a small lung nodule on the left that has since grown ?He has signs of malignant pleural effusion/pleural mass as well as lymphadenopathy in the left hilum, most consistent with probable undiagnosed non-small cell lung cancer ?I do not believe this represent metastatic prostate cancer ?We discussed the importance of biopsy ?I spoke with interventional radiologist who felt that CT-guided left pleural mass would be most appropriate ?I will put the order ?As soon as the biopsy is performed, I will bring him back within 3 to 5 days to review test results ?

## 2021-12-04 NOTE — Assessment & Plan Note (Signed)
He has appointment to see Dr. Alinda Money next month for follow-up ?He will continue follow-up as directed ?

## 2021-12-08 ENCOUNTER — Telehealth: Payer: Self-pay

## 2021-12-08 NOTE — Telephone Encounter (Signed)
Called and given below message. He verbalized understanding. 

## 2021-12-08 NOTE — Telephone Encounter (Signed)
-----   Message from Heath Lark, MD sent at 12/08/2021  1:45 PM EDT ----- ?Pls let him know, since his biopsy is next week, I plan to just keep his appt on 6/5 as scheduled ? ?

## 2021-12-16 ENCOUNTER — Other Ambulatory Visit: Payer: Self-pay | Admitting: Radiology

## 2021-12-16 DIAGNOSIS — J948 Other specified pleural conditions: Secondary | ICD-10-CM

## 2021-12-18 ENCOUNTER — Ambulatory Visit (HOSPITAL_COMMUNITY)
Admission: RE | Admit: 2021-12-18 | Discharge: 2021-12-18 | Disposition: A | Discharge status: Home / Self Care | Payer: Medicare Other | Source: Ambulatory Visit | Attending: Hematology and Oncology | Admitting: Hematology and Oncology

## 2021-12-18 ENCOUNTER — Other Ambulatory Visit: Payer: Self-pay

## 2021-12-18 ENCOUNTER — Encounter (HOSPITAL_COMMUNITY): Payer: Self-pay

## 2021-12-18 ENCOUNTER — Ambulatory Visit (HOSPITAL_COMMUNITY)
Admission: RE | Admit: 2021-12-18 | Discharge: 2021-12-18 | Disposition: A | Discharge status: Home / Self Care | Payer: Medicare Other | Source: Ambulatory Visit | Attending: Interventional Radiology | Admitting: Interventional Radiology

## 2021-12-18 DIAGNOSIS — C3492 Malignant neoplasm of unspecified part of left bronchus or lung: Secondary | ICD-10-CM | POA: Diagnosis present

## 2021-12-18 DIAGNOSIS — J948 Other specified pleural conditions: Secondary | ICD-10-CM

## 2021-12-18 DIAGNOSIS — J90 Pleural effusion, not elsewhere classified: Secondary | ICD-10-CM | POA: Diagnosis not present

## 2021-12-18 LAB — BASIC METABOLIC PANEL
Anion gap: 8 (ref 5–15)
BUN: 24 mg/dL — ABNORMAL HIGH (ref 8–23)
CO2: 24 mmol/L (ref 22–32)
Calcium: 9.2 mg/dL (ref 8.9–10.3)
Chloride: 105 mmol/L (ref 98–111)
Creatinine, Ser: 1.42 mg/dL — ABNORMAL HIGH (ref 0.61–1.24)
GFR, Estimated: 51 mL/min — ABNORMAL LOW (ref >60)
Glucose, Bld: 110 mg/dL — ABNORMAL HIGH (ref 70–99)
Potassium: 3.5 mmol/L (ref 3.5–5.1)
Sodium: 137 mmol/L (ref 135–145)

## 2021-12-18 LAB — PROTIME-INR
INR: 1.1 (ref 0.8–1.2)
Prothrombin Time: 14.2 seconds (ref 11.4–15.2)

## 2021-12-18 LAB — CBC WITH DIFFERENTIAL/PLATELET
Abs Immature Granulocytes: 0.02 10*3/uL (ref 0.00–0.07)
Basophils Absolute: 0 10*3/uL (ref 0.0–0.1)
Basophils Relative: 1 %
Eosinophils Absolute: 0.2 10*3/uL (ref 0.0–0.5)
Eosinophils Relative: 2 %
HCT: 38.1 % — ABNORMAL LOW (ref 39.0–52.0)
Hemoglobin: 13.1 g/dL (ref 13.0–17.0)
Immature Granulocytes: 0 %
Lymphocytes Relative: 13 %
Lymphs Abs: 1.1 10*3/uL (ref 0.7–4.0)
MCH: 34.3 pg — ABNORMAL HIGH (ref 26.0–34.0)
MCHC: 34.4 g/dL (ref 30.0–36.0)
MCV: 99.7 fL (ref 80.0–100.0)
Monocytes Absolute: 1.1 10*3/uL — ABNORMAL HIGH (ref 0.1–1.0)
Monocytes Relative: 13 %
Neutro Abs: 5.9 10*3/uL (ref 1.7–7.7)
Neutrophils Relative %: 71 %
Platelets: 206 10*3/uL (ref 150–400)
RBC: 3.82 MIL/uL — ABNORMAL LOW (ref 4.22–5.81)
RDW: 12.6 % (ref 11.5–15.5)
WBC: 8.2 10*3/uL (ref 4.0–10.5)
nRBC: 0 % (ref 0.0–0.2)

## 2021-12-18 MED ORDER — FENTANYL CITRATE (PF) 100 MCG/2ML IJ SOLN
INTRAMUSCULAR | Status: AC | PRN
  Administered 2021-12-18 (×2): 50 ug via INTRAVENOUS

## 2021-12-18 MED ORDER — HEPARIN SOD (PORK) LOCK FLUSH 100 UNIT/ML IV SOLN
500.0000 [IU] | INTRAVENOUS | Status: AC | PRN
  Administered 2021-12-18: 500 [IU]
  Filled 2021-12-18 (×2): qty 5

## 2021-12-18 MED ORDER — MIDAZOLAM HCL 2 MG/2ML IJ SOLN
INTRAMUSCULAR | Status: AC | PRN
  Administered 2021-12-18 (×2): 1 mg via INTRAVENOUS

## 2021-12-18 MED ORDER — LIDOCAINE HCL (PF) 1 % IJ SOLN
INTRAMUSCULAR | Status: AC | PRN
  Administered 2021-12-18: 20 mL

## 2021-12-18 MED ORDER — MIDAZOLAM HCL 2 MG/2ML IJ SOLN
INTRAMUSCULAR | Status: AC
  Filled 2021-12-18: qty 4

## 2021-12-18 MED ORDER — SODIUM CHLORIDE 0.9 % IV SOLN: INTRAVENOUS | Status: DC

## 2021-12-18 MED ORDER — FENTANYL CITRATE (PF) 100 MCG/2ML IJ SOLN
INTRAMUSCULAR | Status: AC
  Filled 2021-12-18: qty 2

## 2021-12-18 NOTE — H&P (Signed)
Referring Physician(s): Heath Lark  Supervising Physician: Michaelle Birks  Patient Status:  [facility]  Chief Complaint:  "I'm here for a biopsy"  Subjective: Patient known to IR service from bone marrow biopsy in 2012, [place] placements in 2013 and 2014, T12, L2, L3 biopsy as well as ablation and cement augmentation in 2013, and left thoracentesis on 11/21/2021.  He has a history of multiple myeloma in relapse and prostate carcinoma.  Recent PET scan on 12/02/2021 revealed:  1. Hypermetabolic nodular areas of consolidation in the left lung with a hypermetabolic malignant left pleural effusion. Hypermetabolism extends inferiorly along the left hemidiaphragm. Associated hypermetabolic prepericardiac lymph nodes. Findings may be metastatic. Primary bronchogenic carcinoma cannot be excluded. 2. Uptake in the soft tissues of the distal left foot with associated edema in the soft tissues. Please correlate clinically. 3. Cholelithiasis and choledocholithiasis. No biliary ductal dilatation. 4. Bilateral renal stones. 5. Aortic atherosclerosis (ICD10-I70.0). Coronary artery Calcification  He presents today for image guided left pleural mass biopsy for further evaluation.  Additional medical history as below.  He currently denies fever, headache, cough, abdominal pain, nausea, vomiting or bleeding.  He does have some occasional lower anterior chest /epigastric discomfort, dyspnea with exertion, and back pain.  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 Leonie Man)   CATARACT EXTRACTION W/ INTRAOCULAR LENS IMPLANT Bilateral    DEXA  05/2011   spine 0.7, L femur 0.0, R femur -0.6, normal   KIDNEY STONE SURGERY     KIDNEY STONE SURGERY  06/2009   Stone removed from bladder   MELANOMA EXCISION Left 06/05/2019   Procedure: REEXCISION MELANOMA OF LEFT POSTERIOR AURICLE;  Surgeon: Johnathan Hausen, MD;  Location: [place];  Service: General;  Laterality: Left;   PORTACATH PLACEMENT  08/05/11   right sided portacath   PROSTATECTOMY  04/06/07   TOE AMPUTATION Left 2017   2nd toe left foot     Allergies: Celebrex [celecoxib], Cymbalta [duloxetine hcl], Hydromorphone hcl, Meloxicam, Oxycontin [oxycodone], Percocet [oxycodone-acetaminophen], Vicodin [hydrocodone-acetaminophen], and Lyrica [pregabalin]  Medications: Prior to Admission medications   Medication Sig Start Date End Date Taking? Authorizing Provider  aspirin 81 MG tablet Take 81 mg by mouth daily.    [provider]  B Complex Vitamins (VITAMIN-B COMPLEX) TABS Take by mouth.    [provider]  Calcium Carbonate-Vitamin D 600-400 MG-UNIT per tablet Take by mouth.    [provider]  cholecalciferol (VITAMIN D) 1000 UNITS tablet Take 1,000 Units by mouth daily.    [provider]  diclofenac Sodium (VOLTAREN) 1 % GEL  Apply 4 g topically 4 (four) times daily. 03/28/21   Sable Feil, PA-C  lidocaine-prilocaine (EMLA) cream Apply topically as needed. Apply to port site one hour before treatment and cover with plastic wrap 10/13/19   Heath Lark, MD  mupirocin ointment (BACTROBAN) 2 %  Apply 1 application topically 2 (two) times daily. 06/04/21   Hyatt, Max T, DPM  NONFORMULARY OR COMPOUNDED ITEM Baclofn/amitrp/ketamn cream topically to feet TID prn for neuropathy    [provider]  NUBEQA 300 MG tablet Take 600 mg by mouth 2 (two) times daily. 02/24/21   [provider]  oxyCODONE (OXY IR/ROXICODONE) 5 MG immediate release tablet TK 1 T PO Q 4 H PRN P 12/16/15   [provider]  pantoprazole (PROTONIX) 40 MG tablet TAKE 1 TABLET BY MOUTH EVERY DAY 01/28/21   Heath Lark, MD     Vital Signs: BP 130/89, temp 97.6, heart rate 80, respirations 18, O2 sat 95% room air    Physical Exam awake, alert.  Chest with slightly diminished breath sounds left base, right clear.  Clean, intact left chest wall Port-A-Cath.  Heart with regular rate and rhythm.  Abdomen soft, positive bowel sounds, nontender.  No lower extremity edema.  Imaging: No results found.  Labs:  CBC: Recent Labs    08/08/21 1107 09/22/21 1157 11/03/21 1101 12/18/21 0730  WBC 7.1 7.5 6.9 8.2  HGB 12.1* 12.6* 12.2* 13.1  HCT 35.6* 37.4* 36.3* 38.1*  PLT 162 215 216 206    COAGS: No results for input(s): INR, APTT in the last 8760 hours.  BMP: Recent Labs    06/20/21 1147 08/08/21 1107 09/22/21 1157 10/17/21 0924 11/03/21 1101  NA 140 139 137 138 138  K 4.2 4.0 4.1 4.2 4.0  CL 109 106 104 103 104  CO2 _0 GLUCOSE 111* 100* 102* 90 98  BUN 29* 21 24* 22 21  CALCIUM 8.9 9.0 9.5 9.6 9.2  CREATININE 1.39* 1.25* 1.62* 1.34 1.28*  GFRNONAA 53* >60 44*  --  58*    LIVER FUNCTION TESTS: Recent Labs    08/08/21 1107 09/22/21 1157 10/17/21 0924 11/03/21 1101  BILITOT 0.8 0.8 0.7 0.6  AST _1 ALT _2 ALKPHOS 65 53 53 58  PROT 6.7 7.3 7.2 7.1  ALBUMIN 3.6 3.8 3.9 3.6    Assessment and Plan: Patient known to IR service from bone marrow biopsy in 2012, Port-A-Cath placements in 2013 and 2014, T12, L2, L3 biopsy as well as ablation and  cement augmentation in 2013, and left thoracentesis on 11/21/2021.  He has a history of anemia, GERD, hypertension, hyperlipidemia, renal insufficiency, multiple myeloma in relapse and prostate carcinoma.  Recent PET scan on 12/02/2021 revealed:  1. Hypermetabolic nodular areas of consolidation in the left lung with a hypermetabolic malignant left pleural effusion. Hypermetabolism extends inferiorly along the left hemidiaphragm. Associated hypermetabolic prepericardiac lymph nodes. Findings may be metastatic. Primary bronchogenic carcinoma cannot be excluded. 2. Uptake in the soft tissues of the distal left foot with associated edema in the soft tissues. Please correlate clinically. 3. Cholelithiasis and choledocholithiasis. No biliary ductal dilatation. 4. Bilateral renal stones. 5. Aortic atherosclerosis (ICD10-I70.0). Coronary artery Calcification  He presents today for image guided left pleural mass biopsy for further evaluation. Risks and benefits of procedure was discussed with the patient  including, but not limited to bleeding, infection, damage to adjacent structures or low yield requiring additional tests.  All of the questions were answered and there is agreement to proceed.  Consent signed and in chart.    Electronically Signed: D. Rowe Robert, PA-C 12/18/2021, 7:56 AM   I spent a total of 25 minutes at the the patient's bedside AND on the patient's hospital floor or unit, greater than 50% of which was counseling/coordinating care for image guided left pleural mass biopsy

## 2021-12-18 NOTE — Procedures (Signed)
Vascular and Interventional Radiology Procedure Note  Patient: [name] DOB: May 25, 1946 Medical Record Number: [id number] Note Date/Time: 12/18/21 8:53 AM   Performing Physician: Michaelle Birks, MD Assistant(s): None  Diagnosis: L lung mass  Procedure: LEFT LUNG MASS BIOPSY  Anesthesia: Conscious Sedation Complications: None Estimated Blood Loss: Minimal Specimens: Sent for Pathology  Findings:  Successful CT-guided biopsy of LEFT lung mass. A total of 3 samples were obtained. Hemostasis of the tract was achieved using Hemostatic Patch.  Plan: Bed rest for 1 hours.  See detailed procedure note with images in PACS. The patient tolerated the procedure well without incident or complication and was returned to Recovery in stable condition.    Michaelle Birks, MD [facility] Radiology   Pager. [place]

## 2021-12-18 NOTE — Discharge Instructions (Signed)
Please call Interventional Radiology clinic [phone] with any questions or concerns.   You may remove your dressing and shower tomorrow.    Needle Biopsy, Care After These instructions tell you how to care for yourself after your procedure. Your doctor may also give you more specific instructions. Call your doctor if youhave any problems or questions. What can I expect after the procedure? After the procedure, it is common to have: Soreness. Bruising. Mild pain. Follow these instructions at home:  Return to your normal activities as told by your doctor. Ask your doctor what activities are safe for you. Take over-the-counter and prescription medicines only as told by your doctor. Wash your hands with soap and water before you change your bandage (dressing). If you cannot use soap and water, use hand sanitizer. Follow instructions from your doctor about: How to take care of your puncture site. When and how to change your bandage. When to remove your bandage. Check your puncture site every day for signs of infection. Watch for: Redness, swelling, or pain. Fluid or blood. Pus or a bad smell. Warmth. Do not take baths, swim, or use a hot tub until your doctor approves. Ask your doctor if you may take showers. You may only be allowed to take sponge baths. Keep all follow-up visits as told by your doctor. This is important. Contact a doctor if you have: A fever. Redness, swelling, or pain at the puncture site, and it lasts longer than a few days. Fluid, blood, or pus coming from the puncture site. Warmth coming from the puncture site. Get help right away if: You have a lot of bleeding from the puncture site. Summary After the procedure, it is common to have soreness, bruising, or mild pain at the puncture site. Check your puncture site every day for signs of infection, such as redness, swelling, or pain. Get help right away if you have severe bleeding from your puncture  site. This information is not intended to replace advice given to you by your health care provider. Make sure you discuss any questions you have with your healthcare provider. Document Revised: 01/11/2020 Document Reviewed: 01/11/2020 Elsevier Patient Education  2022 Elsevier Inc.   Moderate Conscious Sedation, Adult, Care After This sheet gives you information about how to care for yourself after your procedure. Your health care provider may also give you more specific instructions. If you have problems or questions, contact your health care provider. What can I expect after the procedure? After the procedure, it is common to have: Sleepiness for several hours. Impaired judgment for several hours. Difficulty with balance. Vomiting if you eat too soon. Follow these instructions at home: For the time period you were told by your health care provider: Rest. Do not participate in activities where you could fall or become injured. Do not drive or use machinery. Do not drink alcohol. Do not take sleeping pills or medicines that cause drowsiness. Do not make important decisions or sign legal documents. Do not take care of children on your own.      Eating and drinking Follow the diet recommended by your health care provider. Drink enough fluid to keep your urine pale yellow. If you vomit: Drink water, juice, or soup when you can drink without vomiting. Make sure you have little or no nausea before eating solid foods.   General instructions Take over-the-counter and prescription medicines only as told by your health care provider. Have a responsible adult stay with you for the time you are told.   It is important to have someone help care for you until you are awake and alert. Do not smoke. Keep all follow-up visits as told by your health care provider. This is important. Contact a health care provider if: You are still sleepy or having trouble with balance after 24 hours. You feel  light-headed. You keep feeling nauseous or you keep vomiting. You develop a rash. You have a fever. You have redness or swelling around the IV site. Get help right away if: You have trouble breathing. You have new-onset confusion at home. Summary After the procedure, it is common to feel sleepy, have impaired judgment, or feel nauseous if you eat too soon. Rest after you get home. Know the things you should not do after the procedure. Follow the diet recommended by your health care provider and drink enough fluid to keep your urine pale yellow. Get help right away if you have trouble breathing or new-onset confusion at home. This information is not intended to replace advice given to you by your health care provider. Make sure you discuss any questions you have with your health care provider. Document Revised: 11/10/2019 Document Reviewed: 06/08/2019 Elsevier Patient Education  2021 Elsevier Inc.     

## 2021-12-23 ENCOUNTER — Telehealth: Payer: Self-pay

## 2021-12-23 ENCOUNTER — Other Ambulatory Visit: Payer: Self-pay | Admitting: Hematology and Oncology

## 2021-12-23 ENCOUNTER — Encounter: Payer: Self-pay | Admitting: Hematology and Oncology

## 2021-12-23 DIAGNOSIS — C3490 Malignant neoplasm of unspecified part of unspecified bronchus or lung: Secondary | ICD-10-CM | POA: Insufficient documentation

## 2021-12-23 LAB — SURGICAL PATHOLOGY

## 2021-12-23 NOTE — Telephone Encounter (Signed)
Called and given below message. He verbalized understanding. He will keep appts scheduled with Dr. Alvy Bimler on 6/5. He would like to transfer care to Dr. Julien Nordmann and ask that Dr. Alvy Bimler reach out to Dr. Julien Nordmann.  Given MRI brain appt for 6/5 at 7 am, arrive at [place]. He verbalized understanding.

## 2021-12-23 NOTE — Telephone Encounter (Signed)
-----   Message from Heath Lark, MD sent at 12/23/2021  2:43 PM EDT ----- Path came back lung cancer I am ordering MRI brain to complete staging, help him schedule not sooner than Friday, best next week Please call him and ask if it is ok to transfer his care to Dr. Julien Nordmann (lung cancer expert) If it is ok with him, let me know and I will reach out to Dr. Julien Nordmann If he wants to talk to me first, we can keep his appt

## 2021-12-25 ENCOUNTER — Encounter: Payer: Self-pay | Admitting: Hematology and Oncology

## 2021-12-25 ENCOUNTER — Telehealth: Payer: Self-pay | Admitting: Internal Medicine

## 2021-12-25 ENCOUNTER — Encounter: Payer: Self-pay | Admitting: *Deleted

## 2021-12-25 DIAGNOSIS — J948 Other specified pleural conditions: Secondary | ICD-10-CM

## 2021-12-25 NOTE — Telephone Encounter (Signed)
Scheduled appt per 6/1 staff msg from [employer]. Pt is aware of appt date and time.

## 2021-12-25 NOTE — Progress Notes (Signed)
Oncology Nurse Navigator Documentation     12/25/2021   10:00 AM 12/04/2015   11:00 AM  Oncology Nurse Navigator Flowsheets  Navigator Location [facility]  Navigator Encounter Type Other: Telephone  Telephone  Incoming Call  Barriers/Navigation Needs Coordination of Care/I received a message from Dr. Julien Nordmann and Dr. Alvy Bimler. Patient has been referred to Dr. Julien Nordmann. I notified new patient coordinator to call and schedule him to be seen on 6/13 with Dr. Julien Nordmann.  Education  Education  Other  Interventions Coordination of Care Education Method  Acuity Level 2-Minimal Needs (1-2 Barriers Identified) Level 1  Education Method  Teach-back;Verbal  Support Groups/Services  Friends and Family  Time Spent with Patient 80 16

## 2021-12-29 ENCOUNTER — Encounter: Payer: Self-pay | Admitting: Hematology and Oncology

## 2021-12-29 ENCOUNTER — Inpatient Hospital Stay: Payer: Medicare Other

## 2021-12-29 ENCOUNTER — Inpatient Hospital Stay: Payer: Medicare Other | Attending: Hematology and Oncology

## 2021-12-29 ENCOUNTER — Inpatient Hospital Stay (HOSPITAL_BASED_OUTPATIENT_CLINIC_OR_DEPARTMENT_OTHER): Payer: Medicare Other | Admitting: Hematology and Oncology

## 2021-12-29 ENCOUNTER — Other Ambulatory Visit: Payer: Self-pay

## 2021-12-29 DIAGNOSIS — C9000 Multiple myeloma not having achieved remission: Secondary | ICD-10-CM

## 2021-12-29 DIAGNOSIS — C9002 Multiple myeloma in relapse: Secondary | ICD-10-CM

## 2021-12-29 DIAGNOSIS — J90 Pleural effusion, not elsewhere classified: Secondary | ICD-10-CM

## 2021-12-29 DIAGNOSIS — C3492 Malignant neoplasm of unspecified part of left bronchus or lung: Secondary | ICD-10-CM | POA: Insufficient documentation

## 2021-12-29 DIAGNOSIS — Z95828 Presence of other vascular implants and grafts: Secondary | ICD-10-CM

## 2021-12-29 DIAGNOSIS — C9001 Multiple myeloma in remission: Secondary | ICD-10-CM | POA: Insufficient documentation

## 2021-12-29 DIAGNOSIS — C61 Malignant neoplasm of prostate: Secondary | ICD-10-CM | POA: Diagnosis not present

## 2021-12-29 DIAGNOSIS — C3490 Malignant neoplasm of unspecified part of unspecified bronchus or lung: Secondary | ICD-10-CM | POA: Diagnosis not present

## 2021-12-29 LAB — COMPREHENSIVE METABOLIC PANEL
ALT: 8 U/L (ref 0–44)
AST: 15 U/L (ref 15–41)
Albumin: 3.4 g/dL — ABNORMAL LOW (ref 3.5–5.0)
Alkaline Phosphatase: 47 U/L (ref 38–126)
Anion gap: 6 (ref 5–15)
BUN: 21 mg/dL (ref 8–23)
CO2: 27 mmol/L (ref 22–32)
Calcium: 9.2 mg/dL (ref 8.9–10.3)
Chloride: 105 mmol/L (ref 98–111)
Creatinine, Ser: 1.18 mg/dL (ref 0.61–1.24)
GFR, Estimated: >60 mL/min (ref >60)
Glucose, Bld: 118 mg/dL — ABNORMAL HIGH (ref 70–99)
Potassium: 3.7 mmol/L (ref 3.5–5.1)
Sodium: 138 mmol/L (ref 135–145)
Total Bilirubin: 0.6 mg/dL (ref 0.3–1.2)
Total Protein: 6.5 g/dL (ref 6.5–8.1)

## 2021-12-29 LAB — CBC WITH DIFFERENTIAL/PLATELET
Abs Immature Granulocytes: 0.03 10*3/uL (ref 0.00–0.07)
Basophils Absolute: 0 10*3/uL (ref 0.0–0.1)
Basophils Relative: 1 %
Eosinophils Absolute: 0.2 10*3/uL (ref 0.0–0.5)
Eosinophils Relative: 2 %
HCT: 35 % — ABNORMAL LOW (ref 39.0–52.0)
Hemoglobin: 12.1 g/dL — ABNORMAL LOW (ref 13.0–17.0)
Immature Granulocytes: 0 %
Lymphocytes Relative: 10 %
Lymphs Abs: 0.7 10*3/uL (ref 0.7–4.0)
MCH: 33.9 pg (ref 26.0–34.0)
MCHC: 34.6 g/dL (ref 30.0–36.0)
MCV: 98 fL (ref 80.0–100.0)
Monocytes Absolute: 0.9 10*3/uL (ref 0.1–1.0)
Monocytes Relative: 12 %
Neutro Abs: 5.6 10*3/uL (ref 1.7–7.7)
Neutrophils Relative %: 75 %
Platelets: 192 10*3/uL (ref 150–400)
RBC: 3.57 MIL/uL — ABNORMAL LOW (ref 4.22–5.81)
RDW: 12.8 % (ref 11.5–15.5)
WBC: 7.5 10*3/uL (ref 4.0–10.5)
nRBC: 0 % (ref 0.0–0.2)

## 2021-12-29 MED ORDER — SODIUM CHLORIDE 0.9% FLUSH
10.0000 mL | Freq: Once | INTRAVENOUS | Status: AC
  Administered 2021-12-29: 10 mL via INTRAVENOUS

## 2021-12-29 MED ORDER — SODIUM CHLORIDE 0.9% FLUSH
10.0000 mL | INTRAVENOUS | Status: DC | PRN
  Administered 2021-12-29: 10 mL

## 2021-12-29 MED ORDER — SODIUM CHLORIDE 0.9 % IV SOLN: Freq: Once | INTRAVENOUS | Status: AC

## 2021-12-29 MED ORDER — ZOLEDRONIC ACID 4 MG/5ML IV CONC
3.5000 mg | Freq: Once | INTRAVENOUS | Status: AC
  Administered 2021-12-29: 3.5 mg via INTRAVENOUS
  Filled 2021-12-29: qty 4.38

## 2021-12-29 MED ORDER — HEPARIN SOD (PORK) LOCK FLUSH 100 UNIT/ML IV SOLN
500.0000 [IU] | Freq: Once | INTRAVENOUS | Status: AC
  Administered 2021-12-29: 500 [IU] via INTRAVENOUS

## 2021-12-29 NOTE — Assessment & Plan Note (Signed)
I have reviewed pathology result with the patient and his significant other He will complete staging with MRI of the brain and then transfer care to Dr. Julien Nordmann, who is our lung cancer specialist I will defer to Dr. Julien Nordmann to discuss treatment options with the patient

## 2021-12-29 NOTE — Assessment & Plan Note (Signed)
He has some shortness of breath on exertion but denies recent cough His oxygen saturation is adequate He does not feel that he needs therapeutic thoracentesis today

## 2021-12-29 NOTE — Assessment & Plan Note (Signed)
His recent myeloma panel showed no evidence of recurrent disease He is scheduled for [place] today and we will proceed I recommend once a year treatment

## 2021-12-29 NOTE — Patient Instructions (Signed)

## 2021-12-29 NOTE — Progress Notes (Signed)
[place] OFFICE PROGRESS NOTE  Patient Care Team: Jinny Sanders, MD as PCP - General Jeanann Lewandowsky, MD as Consulting Physician (Hematology and Oncology) Cathlean Marseilles, NP as Nurse Practitioner (Hematology and Oncology) Raynelle Bring, MD as Consulting Physician (Urology) [place], Romilda Garret, [place] as Consulting Physician (Podiatry) Morton Stall, Howell Rucks, NP as Nurse Practitioner (Pain Medicine) Druscilla Brownie, MD as Consulting Physician (Dermatology) Bryson Ha, OD as Consulting Physician (Optometry)  ASSESSMENT & PLAN:  Lung cancer [facility]) I have reviewed pathology result with the patient and his significant other He will complete staging with MRI of the brain and then transfer care to Dr. Julien Nordmann, who is our lung cancer specialist I will defer to Dr. Julien Nordmann to discuss treatment options with the patient  Multiple myeloma in relapse [facility]) His recent myeloma panel showed no evidence of recurrent disease He is scheduled for Zometa today and we will proceed I recommend once a year treatment  Malignant neoplasm of prostate [facility]) He has appointment to see Dr. Alinda Money for follow-up He will continue follow-up as directed  Pleural effusion, left He has some shortness of breath on exertion but denies recent cough His oxygen saturation is adequate He does not feel that he needs therapeutic thoracentesis today  No orders of the defined types were placed in this encounter.   All questions were answered. The patient knows to call the clinic with any problems, questions or concerns. The total time spent in the appointment was 30 minutes encounter with patients including review of chart and various tests results, discussions about plan of care and coordination of care plan   Heath Lark, MD 12/29/2021 12:03 PM  INTERVAL HISTORY: Please see below for problem oriented charting. he returns for treatment follow-up with his significant other He has shortness of breath on  exertion but denies significant chest pressure or cough His chronic pain is being managed by pain specialist from [place] He has several questions related to the role of MRI and future treatment  REVIEW OF SYSTEMS:   Constitutional: Denies fevers, chills or abnormal weight loss Eyes: Denies blurriness of vision Cardiovascular: Denies palpitation, chest discomfort or lower extremity swelling Gastrointestinal:  Denies nausea, heartburn or change in bowel habits Skin: Denies abnormal skin rashes Lymphatics: Denies new lymphadenopathy or easy bruising Neurological:Denies numbness, tingling or new weaknesses Behavioral/Psych: Mood is stable, no new changes  All other systems were reviewed with the patient and are negative.  I have reviewed the past medical history, past surgical history, social history and family history with the patient and they are unchanged from previous note.  ALLERGIES:  is allergic to celebrex [celecoxib], cymbalta [duloxetine hcl], hydromorphone hcl, meloxicam, oxycontin [oxycodone], percocet [oxycodone-acetaminophen], vicodin [hydrocodone-acetaminophen], and lyrica [pregabalin].  MEDICATIONS:  Current Outpatient Medications  Medication Sig Dispense Refill   aspirin 81 MG tablet Take 81 mg by mouth daily.     B Complex Vitamins (VITAMIN-B COMPLEX) TABS Take by mouth.     Calcium Carbonate-Vitamin D 600-400 MG-UNIT per tablet Take by mouth.     cholecalciferol (VITAMIN D) 1000 UNITS tablet Take 1,000 Units by mouth daily.     diclofenac Sodium (VOLTAREN) 1 % GEL Apply 4 g topically 4 (four) times daily. 150 g 1   lidocaine-prilocaine (EMLA) cream Apply topically as needed. Apply to port site one hour before treatment and cover with plastic wrap 30 g 2   mupirocin ointment (BACTROBAN) 2 % Apply 1 application topically 2 (two) times daily. 22 g 6  NONFORMULARY OR COMPOUNDED ITEM Baclofn/amitrp/ketamn cream topically to feet TID prn for neuropathy     NUBEQA 300 MG tablet  Take 600 mg by mouth 2 (two) times daily.     oxyCODONE (OXY IR/ROXICODONE) 5 MG immediate release tablet TK 1 T PO Q 4 H PRN P  0   pantoprazole (PROTONIX) 40 MG tablet TAKE 1 TABLET BY MOUTH EVERY DAY 90 tablet 3   No current facility-administered medications for this visit.    SUMMARY OF ONCOLOGIC HISTORY: Oncology History Overview Note  Adenosquamous lung cancer   Malignant neoplasm of prostate ([place])  02/23/2007 Initial Diagnosis   Malignant neoplasm of prostate ([place])    10/28/2020 Cancer Staging   Staging form: Prostate, AJCC 6th Edition - Clinical: Stage III (T3, N0, M0) - Signed by Heath Lark, MD on 10/28/2020    11/24/2021 Procedure   Successful ultrasound guided diagnostic and therapeutic left thoracentesis yielding 750 cc of pleural fluid.     12/02/2021 PET scan   1. Hypermetabolic nodular areas of consolidation in the left lung with a hypermetabolic malignant left pleural effusion. Hypermetabolism extends inferiorly along the left hemidiaphragm. Associated hypermetabolic prepericardiac lymph nodes. Findings may be metastatic. Primary bronchogenic carcinoma cannot be excluded. 2. Uptake in the soft tissues of the distal left foot with associated edema in the soft tissues. Please correlate clinically. 3. Cholelithiasis and choledocholithiasis. No biliary ductal dilatation. 4. Bilateral renal stones. 5. Aortic atherosclerosis (ICD10-I70.0). Coronary artery calcification.     12/18/2021 Procedure   Successful CT guided core needle core biopsy of LEFT lung mass, as above   Lung cancer ([place])  12/02/2021 PET scan   1. Hypermetabolic nodular areas of consolidation in the left lung with a hypermetabolic malignant left pleural effusion. Hypermetabolism extends inferiorly along the left hemidiaphragm. Associated hypermetabolic prepericardiac lymph nodes. Findings may be metastatic. Primary bronchogenic carcinoma cannot be excluded. 2. Uptake in the soft tissues of the distal left foot  with associated edema in the soft tissues. Please correlate clinically. 3. Cholelithiasis and choledocholithiasis. No biliary ductal dilatation. 4. Bilateral renal stones. 5. Aortic atherosclerosis (ICD10-I70.0). Coronary artery calcification.   12/18/2021 Pathology Results   A. LUNG MASS, LEFT, BIOPSY:  -  Adenocarcinoma  -  See comment  Six immunohistochemical stains were performed with adequate control. The adenocarcinoma is diffusely and strongly positive for cytokeratin 7 and the pulmonary adeno marker TTF-1.  The tumor is negative for the pulmonary adeno marker Napsin A.  The tumor does show a patchy focal positivity for the squamous marker p40 suggesting an adenosquamous differentiation to this tumor.  The tumor is negative for the prostate markers prostate-specific antigen and prostein.    12/23/2021 Initial Diagnosis   Lung cancer ([place])   12/23/2021 Cancer Staging   Staging form: Lung, AJCC 8th Edition - Clinical stage from 12/23/2021: Stage IVA (cT4, cN2, cM1a) - Signed by Heath Lark, MD on 12/23/2021 Stage prefix: Initial diagnosis      PHYSICAL EXAMINATION: ECOG PERFORMANCE STATUS: 1 - Symptomatic but completely ambulatory  Vitals:   12/29/21 1034  BP: (!) 142/75  Pulse: 80  Resp: 18  Temp: 97.6 F (36.4 C)  SpO2: 96%   Filed Weights   12/29/21 1034  Weight: 185 lb 9.6 oz (84.2 kg)    GENERAL:alert, no distress and comfortable NEURO: alert & oriented x 3 with fluent speech, no focal motor/sensory deficits  LABORATORY DATA:  I have reviewed the data as listed    Component Value Date/Time   NA 138  12/29/2021 1006   NA 139 05/14/2017 1145   K 3.7 12/29/2021 1006   K 3.8 05/14/2017 1145   CL 105 12/29/2021 1006   CL 108 (H) 09/30/2013 0945   CL 110 (H) 12/29/2012 1151   CO2 27 12/29/2021 1006   CO2 27 05/14/2017 1145   GLUCOSE 118 (H) 12/29/2021 1006   GLUCOSE 109 05/14/2017 1145   GLUCOSE 127 (H) 12/29/2012 1151   BUN 21 12/29/2021 1006   BUN 17.9  05/14/2017 1145   CREATININE 1.18 12/29/2021 1006   CREATININE 1.35 (H) 05/09/2021 1122   CREATININE 1.3 05/14/2017 1145   CALCIUM 9.2 12/29/2021 1006   CALCIUM 9.3 05/14/2017 1145   PROT 6.5 12/29/2021 1006   PROT 6.2 05/14/2017 1145   PROT 6.6 05/14/2017 1145   ALBUMIN 3.4 (L) 12/29/2021 1006   ALBUMIN 3.3 (L) 05/14/2017 1145   AST 15 12/29/2021 1006   AST 22 05/09/2021 1122   AST 19 05/14/2017 1145   ALT 8 12/29/2021 1006   ALT 12 05/09/2021 1122   ALT 18 05/14/2017 1145   ALKPHOS 47 12/29/2021 1006   ALKPHOS 45 05/14/2017 1145   BILITOT 0.6 12/29/2021 1006   BILITOT 0.7 05/09/2021 1122   BILITOT 0.67 05/14/2017 1145   GFRNONAA >60 12/29/2021 1006   GFRNONAA 55 (L) 05/09/2021 1122   GFRNONAA >60 09/30/2013 0945   GFRAA 59 (L) 04/05/2020 1031   GFRAA >60 09/30/2013 0945    No results found for: SPEP, UPEP  Lab Results  Component Value Date   WBC 7.5 12/29/2021   NEUTROABS 5.6 12/29/2021   HGB 12.1 (L) 12/29/2021   HCT 35.0 (L) 12/29/2021   MCV 98.0 12/29/2021   PLT 192 12/29/2021      Chemistry      Component Value Date/Time   NA 138 12/29/2021 1006   NA 139 05/14/2017 1145   K 3.7 12/29/2021 1006   K 3.8 05/14/2017 1145   CL 105 12/29/2021 1006   CL 108 (H) 09/30/2013 0945   CL 110 (H) 12/29/2012 1151   CO2 27 12/29/2021 1006   CO2 27 05/14/2017 1145   BUN 21 12/29/2021 1006   BUN 17.9 05/14/2017 1145   CREATININE 1.18 12/29/2021 1006   CREATININE 1.35 (H) 05/09/2021 1122   CREATININE 1.3 05/14/2017 1145      Component Value Date/Time   CALCIUM 9.2 12/29/2021 1006   CALCIUM 9.3 05/14/2017 1145   ALKPHOS 47 12/29/2021 1006   ALKPHOS 45 05/14/2017 1145   AST 15 12/29/2021 1006   AST 22 05/09/2021 1122   AST 19 05/14/2017 1145   ALT 8 12/29/2021 1006   ALT 12 05/09/2021 1122   ALT 18 05/14/2017 1145   BILITOT 0.6 12/29/2021 1006   BILITOT 0.7 05/09/2021 1122   BILITOT 0.67 05/14/2017 1145

## 2021-12-29 NOTE — Assessment & Plan Note (Signed)
He has appointment to see Dr. Alinda Money for follow-up He will continue follow-up as directed

## 2021-12-30 ENCOUNTER — Ambulatory Visit (HOSPITAL_COMMUNITY)
Admission: RE | Admit: 2021-12-30 | Discharge: 2021-12-30 | Disposition: A | Discharge status: Home / Self Care | Payer: Medicare Other | Source: Ambulatory Visit | Attending: Hematology and Oncology | Admitting: Hematology and Oncology

## 2021-12-30 DIAGNOSIS — C3490 Malignant neoplasm of unspecified part of unspecified bronchus or lung: Secondary | ICD-10-CM | POA: Insufficient documentation

## 2021-12-30 LAB — KAPPA/LAMBDA LIGHT CHAINS
Kappa free light chain: 47.9 mg/L — ABNORMAL HIGH (ref 3.3–19.4)
Kappa, lambda light chain ratio: 1.58 (ref 0.26–1.65)
Lambda free light chains: 30.4 mg/L — ABNORMAL HIGH (ref 5.7–26.3)

## 2021-12-30 MED ORDER — GADOBUTROL 1 MMOL/ML IV SOLN
8.0000 mL | Freq: Once | INTRAVENOUS | Status: AC | PRN
  Administered 2021-12-30: 8 mL via INTRAVENOUS

## 2022-01-01 ENCOUNTER — Encounter: Payer: Self-pay | Admitting: *Deleted

## 2022-01-01 ENCOUNTER — Other Ambulatory Visit: Payer: Self-pay | Admitting: *Deleted

## 2022-01-01 NOTE — Progress Notes (Signed)
Oncology Nurse Navigator Documentation     01/01/2022   10:00 AM 12/25/2021   10:00 AM 12/04/2015   11:00 AM  Oncology Nurse Navigator Flowsheets  Navigator Location [facility]  Navigator Encounter Type Molecular Studies Other: Telephone  Telephone   Incoming Call  Barriers/Navigation Needs Coordination of Care Coordination of Care Education  Education   Other  Interventions Coordination of Care/per Dr. Julien Nordmann I notified pathology to send tissue [id number] for molecular and PDL 1 testing.   Coordination of Care Education Method  Acuity Level 2-Minimal Needs (1-2 Barriers Identified) Level 2-Minimal Needs (1-2 Barriers Identified) Level 1  Education Method   Teach-back;Verbal  Support Groups/Services   Friends and Family  Time Spent with Patient [phone]

## 2022-01-01 NOTE — Progress Notes (Signed)
The proposed treatment discussed in cancer conference is for discussion purpose only and is not a binding recommendation. The patient was not physically examined nor present for their treatment options. Therefore, final treatment plans cannot be decided.  ?

## 2022-01-02 LAB — MULTIPLE MYELOMA PANEL, SERUM
Albumin SerPl Elph-Mcnc: 3 g/dL (ref 2.9–4.4)
Albumin/Glob SerPl: 1 (ref 0.7–1.7)
Alpha 1: 0.3 g/dL (ref 0.0–0.4)
Alpha2 Glob SerPl Elph-Mcnc: 0.9 g/dL (ref 0.4–1.0)
B-Globulin SerPl Elph-Mcnc: 0.9 g/dL (ref 0.7–1.3)
Gamma Glob SerPl Elph-Mcnc: 1.2 g/dL (ref 0.4–1.8)
Globulin, Total: 3.2 g/dL (ref 2.2–3.9)
IgA: 209 mg/dL (ref 61–437)
IgG (Immunoglobin G), Serum: 1202 mg/dL (ref 603–1613)
IgM (Immunoglobulin M), Srm: 25 mg/dL (ref 15–143)
Total Protein ELP: 6.2 g/dL (ref 6.0–8.5)

## 2022-01-05 ENCOUNTER — Inpatient Hospital Stay: Payer: Medicare Other

## 2022-01-05 ENCOUNTER — Other Ambulatory Visit: Payer: Self-pay

## 2022-01-05 ENCOUNTER — Telehealth: Payer: Self-pay

## 2022-01-05 ENCOUNTER — Other Ambulatory Visit: Payer: Self-pay | Admitting: Medical Oncology

## 2022-01-05 ENCOUNTER — Inpatient Hospital Stay (HOSPITAL_BASED_OUTPATIENT_CLINIC_OR_DEPARTMENT_OTHER): Payer: Medicare Other | Admitting: Internal Medicine

## 2022-01-05 ENCOUNTER — Encounter: Payer: Self-pay | Admitting: Internal Medicine

## 2022-01-05 VITALS — BP 132/68 | HR 79 | Temp 97.2°F | Resp 16 | Wt 193.0 lb

## 2022-01-05 DIAGNOSIS — C9001 Multiple myeloma in remission: Secondary | ICD-10-CM | POA: Diagnosis not present

## 2022-01-05 DIAGNOSIS — C3492 Malignant neoplasm of unspecified part of left bronchus or lung: Secondary | ICD-10-CM

## 2022-01-05 DIAGNOSIS — Z5111 Encounter for antineoplastic chemotherapy: Secondary | ICD-10-CM

## 2022-01-05 DIAGNOSIS — J948 Other specified pleural conditions: Secondary | ICD-10-CM

## 2022-01-05 DIAGNOSIS — Z95828 Presence of other vascular implants and grafts: Secondary | ICD-10-CM

## 2022-01-05 LAB — CBC WITH DIFFERENTIAL (CANCER CENTER ONLY)
Abs Immature Granulocytes: 0.01 10*3/uL (ref 0.00–0.07)
Basophils Absolute: 0 10*3/uL (ref 0.0–0.1)
Basophils Relative: 0 %
Eosinophils Absolute: 0.2 10*3/uL (ref 0.0–0.5)
Eosinophils Relative: 2 %
HCT: 36.1 % — ABNORMAL LOW (ref 39.0–52.0)
Hemoglobin: 12.3 g/dL — ABNORMAL LOW (ref 13.0–17.0)
Immature Granulocytes: 0 %
Lymphocytes Relative: 18 %
Lymphs Abs: 1.4 10*3/uL (ref 0.7–4.0)
MCH: 33.4 pg (ref 26.0–34.0)
MCHC: 34.1 g/dL (ref 30.0–36.0)
MCV: 98.1 fL (ref 80.0–100.0)
Monocytes Absolute: 0.9 10*3/uL (ref 0.1–1.0)
Monocytes Relative: 12 %
Neutro Abs: 5.1 10*3/uL (ref 1.7–7.7)
Neutrophils Relative %: 68 %
Platelet Count: 225 10*3/uL (ref 150–400)
RBC: 3.68 MIL/uL — ABNORMAL LOW (ref 4.22–5.81)
RDW: 13.1 % (ref 11.5–15.5)
WBC Count: 7.6 10*3/uL (ref 4.0–10.5)
nRBC: 0 % (ref 0.0–0.2)

## 2022-01-05 LAB — CMP (CANCER CENTER ONLY)
ALT: 9 U/L (ref 0–44)
AST: 16 U/L (ref 15–41)
Albumin: 3.6 g/dL (ref 3.5–5.0)
Alkaline Phosphatase: 50 U/L (ref 38–126)
Anion gap: 7 (ref 5–15)
BUN: 20 mg/dL (ref 8–23)
CO2: 25 mmol/L (ref 22–32)
Calcium: 9.2 mg/dL (ref 8.9–10.3)
Chloride: 107 mmol/L (ref 98–111)
Creatinine: 1.22 mg/dL (ref 0.61–1.24)
GFR, Estimated: >60 mL/min (ref >60)
Glucose, Bld: 118 mg/dL — ABNORMAL HIGH (ref 70–99)
Potassium: 4.1 mmol/L (ref 3.5–5.1)
Sodium: 139 mmol/L (ref 135–145)
Total Bilirubin: 0.6 mg/dL (ref 0.3–1.2)
Total Protein: 7 g/dL (ref 6.5–8.1)

## 2022-01-05 MED ORDER — HEPARIN SOD (PORK) LOCK FLUSH 100 UNIT/ML IV SOLN
500.0000 [IU] | INTRAVENOUS | Status: AC | PRN
  Administered 2022-01-05: 500 [IU]

## 2022-01-05 MED ORDER — SODIUM CHLORIDE 0.9% FLUSH
10.0000 mL | INTRAVENOUS | Status: AC | PRN
  Administered 2022-01-05: 10 mL

## 2022-01-05 NOTE — Progress Notes (Signed)
[place] [phone]   [phone]  CONSULT NOTE  REFERRING PHYSICIAN: Dr. Heath Lark  REASON FOR CONSULTATION:  76 years old white male recently diagnosed with lung cancer.  HPI [name] is a 76 y.o. male with past medical history significant for multiple medical problems including history of multiple myeloma, hypercalcemia of malignancy, history of prostate cancer status post prostatectomy in 2008, renal insufficiency, GERD, hypertension, dyslipidemia, peripheral neuropathy, degenerative disc disease and longer history of smoking but quit on July 27, 1996.  The patient was seen by his oncologist at [facility] Dr. Morton Stall and chest x-ray performed on 11/17/2021 showed evidence of opacity on the left lung with moderate left pleural effusion.  On 11/21/2021 he underwent ultrasound-guided diagnostic and therapeutic left thoracentesis with drainage of 750 cc of pleural fluid.  The cytology at that time showed no malignant cells identified.  On 12/01/2021 the patient had a PET scan performed and it showed hypermetabolic pleural nodularity and thickening throughout the left hemothorax with the index thickening in the anterior left hemothorax measuring 1.4 cm at the level of the heart with SUV max of 10.  There was associated moderate and slightly loculated left pleural effusion.  Hypermetabolic areas of nodular consolidation in the left lung with index lingular nodule measuring 2.2 x 2.4 cm with SUV max of 8.2 and hypermetabolic pericardiac lymph nodes with index lymph node measuring 0.8 cm with SUV max of 9.5.  The hypermetabolism extends inferiorly along the left hemidiaphragm.  On 12/18/2021 the patient underwent CT-guided left lung mass biopsy by interventional radiology and the final pathology ([id number]) of the biopsy of the left lung mass was consistent with adenocarcinoma. The adenocarcinoma is diffusely and strongly positive for cytokeratin 7  and  the pulmonary adeno marker TTF-1.  The tumor is negative for the pulmonary adeno marker Napsin A.  The tumor does show a patchy focal positivity for the squamous marker p40 suggesting an adenosquamous differentiation to this tumor.  The tumor is negative for the prostate markers prostate-specific antigen and prostein.  The patient had MRI of the brain performed on 12/30/2021 and this was negative for metastatic disease to the brain. The patient was referred to me today for evaluation and recommendation regarding his condition. When seen today the patient is feeling fine with no concerning complaints except for intermittent pain on the right side of the chest as well as shortness of breath with exertion but no cough or hemoptysis.  He lost few pounds recently but no significant nausea, vomiting, diarrhea or constipation.  He has no headache or visual changes. Family history significant for mother with COPD.  Father had a stroke.  Brother had lung cancer and his sister had kidney cancer. The patient is married and has 2 children a son and daughter.  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  He has a history of smoking more than 1 pack/day for around 20 years and quit in 1998.  He has no history of alcohol or drug abuse.  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   DDD (degenerative disc disease)    Fractures    compression T12,L3  GERD (gastroesophageal reflux disease)    History of chemotherapy    weekly Velcade,Cytoxan   HTN (hypertension)    Hypercalcemia of malignancy    Hyperlipemia    Multiple myeloma 07/09/2011   Multiple myeloma    Myeloma kidney (HCC)    Peripheral neuropathy    toes, sees Dr  Krista Blue   Prostate cancer [facility]) 2008   s/p prostatectomy   Prostate cancer ([place]) 03/2002   Renal insufficiency     Past Surgical History:  Procedure Laterality Date   BASAL CELL CARCINOMA EXCISION Left 10/22/2020   [facility] (Dr. Leonie Man)   CATARACT EXTRACTION W/ INTRAOCULAR LENS IMPLANT Bilateral    DEXA  05/2011   spine 0.7, L femur 0.0, R femur -0.6, normal   KIDNEY STONE SURGERY     KIDNEY STONE SURGERY  06/2009   Stone removed from bladder   MELANOMA EXCISION Left 06/05/2019   Procedure: REEXCISION MELANOMA OF LEFT POSTERIOR AURICLE;  Surgeon: Johnathan Hausen, MD;  Location: [facility];  Service: General;  Laterality: Left;   PORTACATH PLACEMENT  08/05/11   right sided portacath   PROSTATECTOMY  04/06/07   TOE AMPUTATION Left 2017   2nd toe left foot    Family History  Problem Relation Age of Onset   Pneumonia Mother    Stroke Father    Cancer Brother        lung   Kidney cancer Sister    Colon cancer Neg Hx     Social History Social History   Tobacco Use   Smoking status: Former    Packs/day: 2.00    Years: 30.00    Total pack years: 60.00    Types: Cigarettes    Quit date: 07/27/1996    Years since quitting: 25.4   Smokeless tobacco: Never  Substance Use Topics   Alcohol use: No   Drug use: No    Allergies  Allergen Reactions   Celebrex [Celecoxib] Nausea Only   Cymbalta [Duloxetine Hcl] Other (See Comments) and Hives    Weight loss Weight loss   Hydromorphone Hcl Itching   Meloxicam Itching   Oxycontin [Oxycodone] Itching    Also caused constipation, insomnia Also caused constipation, insomnia Also caused constipation, insomnia   Percocet [Oxycodone-Acetaminophen] Itching   Vicodin [Hydrocodone-Acetaminophen] Itching   Lyrica [Pregabalin] Rash    Lesions    Current Outpatient Medications  Medication Sig Dispense Refill   aspirin 81 MG tablet Take 81 mg by mouth daily.     B Complex Vitamins (VITAMIN-B COMPLEX) TABS Take by mouth.      Calcium Carbonate-Vitamin D 600-400 MG-UNIT per tablet Take by mouth.     cholecalciferol (VITAMIN D) 1000 UNITS tablet Take 1,000 Units by mouth daily.     diclofenac Sodium (VOLTAREN) 1 % GEL Apply 4 g topically 4 (four) times daily. 150 g 1   fentaNYL (DURAGESIC) 75 MCG/HR Place 1 patch onto the skin every 3 (three) days.     lidocaine-prilocaine (EMLA) cream Apply topically as needed. Apply to port site one hour before treatment and cover with plastic wrap 30 g 2   mupirocin ointment (BACTROBAN) 2 % Apply 1 application topically 2 (two) times daily. 22 g 6   NONFORMULARY OR COMPOUNDED ITEM Baclofn/amitrp/ketamn cream topically to feet TID prn for neuropathy     NUBEQA 300 MG tablet Take 600 mg by mouth 2 (two) times daily.     oxyCODONE (OXY IR/ROXICODONE) 5 MG immediate release tablet TK 1 T PO  Q 4 H PRN P  0   pantoprazole (PROTONIX) 40 MG tablet TAKE 1 TABLET BY MOUTH EVERY DAY 90 tablet 3   No current facility-administered medications for this visit.    Review of Systems  Constitutional: positive for fatigue and weight loss Eyes: negative Ears, nose, mouth, throat, and face: negative Respiratory: positive for dyspnea on exertion and pleurisy/chest pain Cardiovascular: negative Gastrointestinal: negative Genitourinary:negative Integument/breast: negative Hematologic/lymphatic: negative Musculoskeletal:negative Neurological: negative Behavioral/Psych: negative Endocrine: negative Allergic/Immunologic: negative  Physical Exam  VPX:TGGYI, healthy, no distress, well nourished, and well developed SKIN: skin color, texture, turgor are normal, no rashes or significant lesions HEAD: Normocephalic, No masses, lesions, tenderness or abnormalities EYES: normal, PERRLA, Conjunctiva are pink and non-injected EARS: External ears normal, Canals clear OROPHARYNX:no exudate, no erythema, and lips, buccal mucosa, and tongue normal  NECK: supple, no adenopathy, no JVD LYMPH:  no  palpable lymphadenopathy, no hepatosplenomegaly LUNGS: coarse sounds heard, decreased breath sounds HEART: regular rate & rhythm, no murmurs, and no gallops ABDOMEN:abdomen soft, non-tender, normal bowel sounds, and no masses or organomegaly BACK: Back symmetric, no curvature., No CVA tenderness EXTREMITIES:no joint deformities, effusion, or inflammation, no edema  NEURO: alert & oriented x 3 with fluent speech, no focal motor/sensory deficits  PERFORMANCE STATUS: ECOG 1  LABORATORY DATA: Lab Results  Component Value Date   WBC 7.5 12/29/2021   HGB 12.1 (L) 12/29/2021   HCT 35.0 (L) 12/29/2021   MCV 98.0 12/29/2021   PLT 192 12/29/2021      Chemistry      Component Value Date/Time   NA 138 12/29/2021 1006   NA 139 05/14/2017 1145   K 3.7 12/29/2021 1006   K 3.8 05/14/2017 1145   CL 105 12/29/2021 1006   CL 108 (H) 09/30/2013 0945   CL 110 (H) 12/29/2012 1151   CO2 27 12/29/2021 1006   CO2 27 05/14/2017 1145   BUN 21 12/29/2021 1006   BUN 17.9 05/14/2017 1145   CREATININE 1.18 12/29/2021 1006   CREATININE 1.35 (H) 05/09/2021 1122   CREATININE 1.3 05/14/2017 1145      Component Value Date/Time   CALCIUM 9.2 12/29/2021 1006   CALCIUM 9.3 05/14/2017 1145   ALKPHOS 47 12/29/2021 1006   ALKPHOS 45 05/14/2017 1145   AST 15 12/29/2021 1006   AST 22 05/09/2021 1122   AST 19 05/14/2017 1145   ALT 8 12/29/2021 1006   ALT 12 05/09/2021 1122   ALT 18 05/14/2017 1145   BILITOT 0.6 12/29/2021 1006   BILITOT 0.7 05/09/2021 1122   BILITOT 0.67 05/14/2017 1145       RADIOGRAPHIC STUDIES: MR BRAIN W WO CONTRAST  Result Date: 12/31/2021 CLINICAL DATA:  Non-small cell lung cancer staging EXAM: MRI HEAD WITHOUT AND WITH CONTRAST TECHNIQUE: Multiplanar, multiecho pulse sequences of the brain and surrounding structures were obtained without and with intravenous contrast. CONTRAST:  66m GADAVIST GADOBUTROL 1 MMOL/ML IV SOLN COMPARISON:  [name] Available. FINDINGS: Brain: No enhancement  or swelling to suggest metastatic disease. Mild for age chronic small vessel ischemia in the hemispheric white matter. Normal brain volume. No hemorrhage, hydrocephalus, or collection Vascular: Normal flow voids and vascular enhancements Skull and upper cervical spine: No focal marrow lesion. Sinuses/Orbits: Negative IMPRESSION: Negative for metastatic disease. Electronically Signed   By: JJorje GuildM.D.   On: 12/31/2021 07:21   CT LUNG MASS BIOPSY  Result Date: 12/18/2021 INDICATION: LEFT lung mass EXAM: CT-GUIDED LEFT LUNG MASS BIOPSY RADIATION DOSE REDUCTION: This exam was performed  according to the departmental dose-optimization program which includes automated exposure control, adjustment of the mA and/or kV according to patient size and/or use of iterative reconstruction technique. COMPARISON:  PET-CT, 12/01/2021.  Chest XR, 12/01/2021. MEDICATIONS: [name]. ANESTHESIA/SEDATION: Moderate (conscious) sedation was employed during this procedure. A total of Versed 2 mg and Fentanyl 100 mcg was administered intravenously. Moderate Sedation Time: 15 minutes. The patient's level of consciousness and vital signs were monitored continuously by radiology nursing throughout the procedure under my direct supervision. CONTRAST:  [name] COMPLICATIONS: [name] immediate. PROCEDURE: Informed consent was obtained from the patient following an explanation of the procedure, risks, benefits and alternatives. The patient understands,agrees and consents for the procedure. All questions were addressed. A time out was performed prior to the initiation of the procedure. The patient was positioned supine on the CT table and a limited chest CT was performed for procedural planning demonstrating LEFT upper lobe lung mass. The operative site was prepped and draped in the usual sterile fashion. Under sterile conditions and local anesthesia, a 17 gauge coaxial needle was advanced into the peripheral aspect of the nodule. Positioning was  confirmed with intermittent CT fluoroscopy and followed by the acquisition of 3 core biopsies with an 18 gauge core needle biopsy device. The coaxial needle was removed following an injection of hemostatic patch and superficial hemostasis was achieved with manual compression. Limited post procedural chest CT was negative for pneumothorax or additional complication. A dressing was placed. The patient tolerated the procedure well without immediate postprocedural complication. The patient was escorted to have an upright chest radiograph. IMPRESSION: Successful CT guided core needle core biopsy of LEFT lung mass, as above. Michaelle Birks, MD [facility] Electronically Signed   By: Michaelle Birks M.D.   On: 12/18/2021 15:09   DG Chest Port 1 View  Result Date: 12/18/2021 CLINICAL DATA:  Post lung biopsy.  LEFT EXAM: PORTABLE CHEST 1 VIEW COMPARISON:  IR CT, earlier same day. PET-CT, 12/01/2021. Chest XR, 11/21/2021. FINDINGS: Support lines: LEFT IJ port with catheter tip at the superior cavoatrial junction. Cardiomediastinal silhouette is obscured by LEFT lung base consolidation. Hazy opacities at the LEFT high and lingula. The RIGHT lung is clear. Small volume LEFT pleural effusion. No pneumothorax. No acute osseous findings . IMPRESSION: 1. No LEFT pneumothorax 2. Small volume LEFT pleural effusion and basilar consolidation, unchanged from recent comparison PET-CT. Electronically Signed   By: Michaelle Birks M.D.   On: 12/18/2021 12:08    ASSESSMENT: This is a very pleasant 76 years old white male recently diagnosed with stage IV (T1c, N2, M1 a) non-small cell lung cancer, adenocarcinoma presented with left lingular nodule in addition to pleural nodularity and thickening throughout the left hemothorax as well as malignant left pleural effusion and pericardiac lymphadenopathy diagnosed in May 2023. Molecular studies and PD-L1 expression are still pending. The  patient also has a history of prostate adenocarcinoma status post prostatectomy in 2003 and also history of multiple myeloma currently in remission diagnosed in 2012.  PLAN: I had a lengthy discussion with the patient and his wife today about his current disease stage, prognosis and treatment options. I personally and independently reviewed the scan images and discussed the result and showed the images to the patient and his wife today. I recommended for the patient to wait for the molecular studies before making any decision regarding his systemic treatment. The patient understands that he has incurable condition and all the treatment will be of palliative nature.  I explained to the patient that his treatment option are either palliative care and hospice referral versus palliative treatment with either systemic chemotherapy in combination with immunotherapy or single agent immunotherapy based on the PD-L1 expression versus treatment with targeted therapy if he has any actionable mutations. I recommended for the patient to wait for the molecular studies before proceeding with systemic therapy because of the significant benefit of treatment with targeted therapy if he has an actionable mutation. I will see him back for follow-up visit in around 2-3 weeks for evaluation and more detailed discussion of his treatment options based on the molecular studies. For the left pleural effusion, will consider the patient for repeat left thoracentesis as needed based on his symptoms and the reaccumulation of the fluid. The patient and his wife are in agreement with the current plan. The patient voices understanding of current disease status and treatment options and is in agreement with the current care plan. He was advised to call immediately if he has any other concerning symptoms in the interval. All questions were answered. The patient knows to call the clinic with any problems, questions or concerns. We can  certainly see the patient much sooner if necessary.  Thank you so much for allowing me to participate in the care of ROMNEY COMPEAN. I will continue to follow up the patient with you and assist in his care. The total time spent in the appointment was 90 minutes.  Disclaimer: This note was dictated with voice recognition software. Similar sounding words can inadvertently be transcribed and may not be corrected upon review.   Eilleen Kempf January 05, 2022, 1:30 PM

## 2022-01-05 NOTE — Patient Instructions (Signed)
Thank you for choosing [facility] to provide your care.   Should you have questions after your visit to the [facility] ([facility]), please contact this office at [phone] between 8:30 AM and 4:30 PM.  Voice mails left after 4:00 PM may not be returned until the following business day.  Calls received after 4:30 PM will be answered by an off-site Nurse Triage Line.    Prescription Refills:  Please have your pharmacy contact us directly for most prescription requests.  Contact the office directly for refills of narcotics (pain medications). Allow 48-72 hours for refills.  Appointments: Please contact the [facility] scheduling department [phone] for questions regarding [facility] appointment scheduling.  Contact the schedulers with any scheduling changes so that your appointment can be rescheduled in a timely manner.   Central Scheduling for [place] [phone] - Call to schedule procedures such as PET scans, CT scans, MRI, Ultrasound, etc.  To afford each patient quality time with our providers, please arrive 30 minutes before your scheduled appointment time.  If you arrive late for your appointment, you may be asked to reschedule.  We strive to give you quality time with our providers, and arriving late affects you and other patients whose appointments are after yours. If you are a no show for multiple scheduled visits, you may be dismissed from the clinic at the providers discretion.     Resources: [facility] Social Workers [phone] for additional information on assistance programs or assistance connecting with community support programs   [place] DSS  336-641-3447: Information regarding food stamps, Medicaid, and utility assistance [place] Access [place] 336-333-6589   [place] [employer]'s shared-ride transportation service for eligible riders who have a disability that prevents them from riding the fixed route bus.   Medicare Rights Center 800-333-4114  Helps people with Medicare understand their rights and benefits, navigate the Medicare system, and secure the quality healthcare they deserve American Cancer Society 800-227-2345 Assists patients locate various types of support and financial assistance Cancer Care: 1-800-813-HOPE (4673) Provides financial assistance, online support groups, medication/co-pay assistance.   Transportation Assistance for appointments at [facility]:[phone]  Again, thank you for choosing  Cancer Center for your care.       Lung Cancer Lung cancer is an abnormal growth of cancerous cells that forms a mass (malignant tumor) in a lung. There are several types of lung cancer. The types are based on the appearance of the tumor cells. The two most common types are: Non-small cell lung cancer. This type of lung cancer is the most common type. Non-small cell lung cancers include squamous cell carcinoma, adenocarcinoma, and large cell carcinoma. Small cell lung cancer. In this type of lung cancer, abnormal cells are smaller than those of non-small cell lung cancer. Small cell lung cancer gets worse (progresses) faster than non-small cell lung cancer. What are the causes? The most common cause of lung cancer is smoking tobacco. The second most common cause is exposure to a chemical called radon. What increases the risk? You are more likely to develop this condition if: You smoke tobacco. You have been exposed to: Secondhand tobacco smoke. Radon gas. Uranium. Asbestos. Arsenic in drinking water. Air pollution and diesel exhaust. You have a family or personal history of lung cancer. You have had lung radiation therapy in the past. You are older than age 65. What are the signs or symptoms? In the early stages, you may not have any symptoms. As the cancer progresses, symptoms may include: A   lasting cough, possibly with blood. Fatigue. Unexplained weight loss. Shortness of breath. High-pitched whistling  sounds when you breathe, most often when you breathe out (wheezing). Chest pain. Loss of appetite. Symptoms of advanced lung cancer include: Hoarseness. Bone or joint pain. Weakness. Change in the structure of the fingernails (clubbing), so that the nail looks like an upside-down spoon. Swelling of the face or arms. Inability to move the face (paralysis). Drooping eyelids. How is this diagnosed? This condition may be diagnosed based on: Your symptoms and medical history. A physical exam. A chest X-ray. A CT scan. Blood tests. Sputum tests. Removal of a sample of lung tissue (lung biopsy) for testing. Your cancer will be assessed (staged) to determine how severe it is and how much it has spread (metastasized). How is this treated? Treatment depends on the type and stage of your cancer. Treatment may include one or more of the following: Surgery to remove as much of the cancer as possible. Lymph nodes in the area may be removed and tested for cancer as well. Medicines that kill cancer cells (chemotherapy). High-energy rays that kill cancer cells (radiation therapy). Targeted therapy. This targets specific parts of cancer cells and the area around them to block the growth and spread of the cancer. Targeted therapy can help limit the damage to healthy cells. Immunotherapy. This treatment uses a person's own immune system to fight cancer by either boosting the immune system or changing how the immune system works. Follow these instructions at home:  Do not use any products that contain nicotine or tobacco. These products include cigarettes, chewing tobacco, and vaping devices, such as e-cigarettes. If you need help quitting, ask your health care provider. Do not drink alcohol. If you are admitted to the hospital, make sure your cancer specialist (oncologist) is aware. Your cancer may affect your treatment for other conditions. Take over-the-counter and prescription medicines only as told  by your health care provider. Work with your health care provider to manage any side effects of treatment. Keep all follow-up visits. This is important. Where to find support Consider joining a local support group for people who have been diagnosed with lung cancer. Where to find more information American Cancer Society: www.cancer.org National Cancer Institute (NCI): www.cancer.gov Contact a health care provider if you: Lose weight without trying. Have a persistent cough and wheezing. Feel short of breath. Get tired easily. Have bone or joint pain. Have difficulty swallowing. Notice that your voice is changing or getting hoarse. Have pain that does not get better with medicine. Get help right away if you: Cough up blood. Have chest pain or new breathing problems. Have a fever. Have swelling in an ankle, leg, or arm, or the face or neck. Have paralysis in your face. Are very confused. Have a drooping eyelid. These symptoms may represent a serious problem that is an emergency. Do not wait to see if the symptoms will go away. Get medical help right away. Call your local emergency services (911 in the [place] Do not drive yourself to the hospital. Summary Lung cancer is an abnormal growth of cancerous cells that forms a mass (malignant tumor) in a lung. There are several types of lung cancer. The types are based on the appearance of the tumor cells. The two most common types are non-small cell and small cell. The most common cause of lung cancer is smoking tobacco. Early symptoms include a lasting cough, possibly with blood, and fatigue, unexplained weight loss, and shortness of breath. After diagnosis,   treatment depends on the type and stage of your cancer. This information is not intended to replace advice given to you by your health care provider. Make sure you discuss any questions you have with your health care provider. Document Revised: 01/01/2021 Document Reviewed:  01/01/2021 Elsevier Patient Education  2023 Elsevier Inc.  

## 2022-01-05 NOTE — Telephone Encounter (Signed)
-----   Message from Heath Lark, MD sent at 01/05/2022  8:31 AM EDT ----- I think he sees Dr. Jerilynn Mages today MM panel is normal

## 2022-01-05 NOTE — Telephone Encounter (Signed)
Called and left below message. Ask him to call the office back if needed.

## 2022-01-07 ENCOUNTER — Telehealth: Payer: Self-pay | Admitting: Internal Medicine

## 2022-01-07 NOTE — Telephone Encounter (Signed)
Scheduled per 06/12 los, patient has been called and notified of upcoming appointments.

## 2022-01-12 ENCOUNTER — Encounter (HOSPITAL_COMMUNITY): Payer: Self-pay | Admitting: Internal Medicine

## 2022-01-19 ENCOUNTER — Inpatient Hospital Stay: Payer: Medicare Other

## 2022-01-19 ENCOUNTER — Other Ambulatory Visit: Payer: Self-pay | Admitting: Medical Oncology

## 2022-01-19 ENCOUNTER — Other Ambulatory Visit: Payer: Self-pay

## 2022-01-19 ENCOUNTER — Inpatient Hospital Stay (HOSPITAL_BASED_OUTPATIENT_CLINIC_OR_DEPARTMENT_OTHER): Payer: Medicare Other | Admitting: Internal Medicine

## 2022-01-19 VITALS — BP 134/80 | HR 92 | Temp 97.5°F | Resp 16 | Ht 71.0 in | Wt 179.3 lb

## 2022-01-19 DIAGNOSIS — C3492 Malignant neoplasm of unspecified part of left bronchus or lung: Secondary | ICD-10-CM

## 2022-01-19 DIAGNOSIS — Z5112 Encounter for antineoplastic immunotherapy: Secondary | ICD-10-CM

## 2022-01-19 DIAGNOSIS — Z95828 Presence of other vascular implants and grafts: Secondary | ICD-10-CM

## 2022-01-19 DIAGNOSIS — R5382 Chronic fatigue, unspecified: Secondary | ICD-10-CM | POA: Diagnosis not present

## 2022-01-19 DIAGNOSIS — C9001 Multiple myeloma in remission: Secondary | ICD-10-CM | POA: Diagnosis not present

## 2022-01-19 DIAGNOSIS — Z5111 Encounter for antineoplastic chemotherapy: Secondary | ICD-10-CM | POA: Diagnosis not present

## 2022-01-19 DIAGNOSIS — E538 Deficiency of other specified B group vitamins: Secondary | ICD-10-CM

## 2022-01-19 LAB — CMP (CANCER CENTER ONLY)
ALT: 9 U/L (ref 0–44)
AST: 16 U/L (ref 15–41)
Albumin: 3.7 g/dL (ref 3.5–5.0)
Alkaline Phosphatase: 47 U/L (ref 38–126)
Anion gap: 8 (ref 5–15)
BUN: 26 mg/dL — ABNORMAL HIGH (ref 8–23)
CO2: 26 mmol/L (ref 22–32)
Calcium: 9.3 mg/dL (ref 8.9–10.3)
Chloride: 103 mmol/L (ref 98–111)
Creatinine: 1.37 mg/dL — ABNORMAL HIGH (ref 0.61–1.24)
GFR, Estimated: 53 mL/min — ABNORMAL LOW (ref >60)
Glucose, Bld: 116 mg/dL — ABNORMAL HIGH (ref 70–99)
Potassium: 4 mmol/L (ref 3.5–5.1)
Sodium: 137 mmol/L (ref 135–145)
Total Bilirubin: 0.7 mg/dL (ref 0.3–1.2)
Total Protein: 7.3 g/dL (ref 6.5–8.1)

## 2022-01-19 LAB — CBC WITH DIFFERENTIAL (CANCER CENTER ONLY)
Abs Immature Granulocytes: 0.02 10*3/uL (ref 0.00–0.07)
Basophils Absolute: 0.1 10*3/uL (ref 0.0–0.1)
Basophils Relative: 1 %
Eosinophils Absolute: 0.1 10*3/uL (ref 0.0–0.5)
Eosinophils Relative: 2 %
HCT: 36.4 % — ABNORMAL LOW (ref 39.0–52.0)
Hemoglobin: 12.5 g/dL — ABNORMAL LOW (ref 13.0–17.0)
Immature Granulocytes: 0 %
Lymphocytes Relative: 16 %
Lymphs Abs: 1.2 10*3/uL (ref 0.7–4.0)
MCH: 33.6 pg (ref 26.0–34.0)
MCHC: 34.3 g/dL (ref 30.0–36.0)
MCV: 97.8 fL (ref 80.0–100.0)
Monocytes Absolute: 1.1 10*3/uL — ABNORMAL HIGH (ref 0.1–1.0)
Monocytes Relative: 15 %
Neutro Abs: 5 10*3/uL (ref 1.7–7.7)
Neutrophils Relative %: 66 %
Platelet Count: 241 10*3/uL (ref 150–400)
RBC: 3.72 MIL/uL — ABNORMAL LOW (ref 4.22–5.81)
RDW: 12.8 % (ref 11.5–15.5)
WBC Count: 7.6 10*3/uL (ref 4.0–10.5)
nRBC: 0 % (ref 0.0–0.2)

## 2022-01-19 MED ORDER — CYANOCOBALAMIN 1000 MCG/ML IJ SOLN
1000.0000 ug | Freq: Once | INTRAMUSCULAR | Status: AC
  Administered 2022-01-19: 1000 ug via INTRAMUSCULAR
  Filled 2022-01-19: qty 1

## 2022-01-19 MED ORDER — PROCHLORPERAZINE MALEATE 10 MG PO TABS: 10.0000 mg | ORAL_TABLET | Freq: Four times a day (QID) | ORAL | 0 refills | Status: DC | PRN

## 2022-01-19 MED ORDER — LIDOCAINE-PRILOCAINE 2.5-2.5 % EX CREA: TOPICAL_CREAM | CUTANEOUS | 0 refills | Status: DC

## 2022-01-19 MED ORDER — HEPARIN SOD (PORK) LOCK FLUSH 100 UNIT/ML IV SOLN
500.0000 [IU] | Freq: Once | INTRAVENOUS | Status: AC
  Administered 2022-01-19: 500 [IU]

## 2022-01-19 MED ORDER — SODIUM CHLORIDE 0.9% FLUSH
10.0000 mL | Freq: Once | INTRAVENOUS | Status: AC
  Administered 2022-01-19: 10 mL

## 2022-01-19 MED ORDER — FOLIC ACID 1 MG PO TABS: 1.0000 mg | ORAL_TABLET | Freq: Every day | ORAL | 4 refills | Status: DC

## 2022-01-21 ENCOUNTER — Telehealth: Payer: Self-pay | Admitting: Internal Medicine

## 2022-01-21 NOTE — Telephone Encounter (Signed)
Scheduled per 06/26 los, patient has been called and notified of upcoming appointments.

## 2022-01-23 ENCOUNTER — Other Ambulatory Visit: Payer: Self-pay

## 2022-01-23 ENCOUNTER — Inpatient Hospital Stay: Payer: Medicare Other

## 2022-01-24 ENCOUNTER — Other Ambulatory Visit: Payer: Self-pay | Admitting: Hematology and Oncology

## 2022-01-26 ENCOUNTER — Encounter: Payer: Self-pay | Admitting: Internal Medicine

## 2022-01-28 ENCOUNTER — Inpatient Hospital Stay: Payer: Medicare Other | Attending: Hematology and Oncology

## 2022-01-28 ENCOUNTER — Other Ambulatory Visit: Payer: Medicare Other

## 2022-01-28 ENCOUNTER — Inpatient Hospital Stay: Payer: Medicare Other

## 2022-01-28 ENCOUNTER — Other Ambulatory Visit: Payer: Self-pay

## 2022-01-28 VITALS — BP 122/73 | HR 80 | Temp 97.9°F | Resp 18 | Ht 71.0 in | Wt 179.8 lb

## 2022-01-28 DIAGNOSIS — C9001 Multiple myeloma in remission: Secondary | ICD-10-CM | POA: Insufficient documentation

## 2022-01-28 DIAGNOSIS — J910 Malignant pleural effusion: Secondary | ICD-10-CM | POA: Insufficient documentation

## 2022-01-28 DIAGNOSIS — C3492 Malignant neoplasm of unspecified part of left bronchus or lung: Secondary | ICD-10-CM | POA: Insufficient documentation

## 2022-01-28 DIAGNOSIS — R5382 Chronic fatigue, unspecified: Secondary | ICD-10-CM

## 2022-01-28 DIAGNOSIS — C3412 Malignant neoplasm of upper lobe, left bronchus or lung: Secondary | ICD-10-CM

## 2022-01-28 DIAGNOSIS — Z79899 Other long term (current) drug therapy: Secondary | ICD-10-CM | POA: Insufficient documentation

## 2022-01-28 DIAGNOSIS — Z95828 Presence of other vascular implants and grafts: Secondary | ICD-10-CM

## 2022-01-28 DIAGNOSIS — Z5112 Encounter for antineoplastic immunotherapy: Secondary | ICD-10-CM | POA: Diagnosis not present

## 2022-01-28 DIAGNOSIS — C9002 Multiple myeloma in relapse: Secondary | ICD-10-CM

## 2022-01-28 DIAGNOSIS — Z5111 Encounter for antineoplastic chemotherapy: Secondary | ICD-10-CM | POA: Insufficient documentation

## 2022-01-28 DIAGNOSIS — D6481 Anemia due to antineoplastic chemotherapy: Secondary | ICD-10-CM | POA: Diagnosis not present

## 2022-01-28 LAB — CBC WITH DIFFERENTIAL (CANCER CENTER ONLY)
Abs Immature Granulocytes: 0.03 10*3/uL (ref 0.00–0.07)
Basophils Absolute: 0.1 10*3/uL (ref 0.0–0.1)
Basophils Relative: 1 %
Eosinophils Absolute: 0.1 10*3/uL (ref 0.0–0.5)
Eosinophils Relative: 1 %
HCT: 35.3 % — ABNORMAL LOW (ref 39.0–52.0)
Hemoglobin: 12.1 g/dL — ABNORMAL LOW (ref 13.0–17.0)
Immature Granulocytes: 0 %
Lymphocytes Relative: 16 %
Lymphs Abs: 1.3 10*3/uL (ref 0.7–4.0)
MCH: 33.4 pg (ref 26.0–34.0)
MCHC: 34.3 g/dL (ref 30.0–36.0)
MCV: 97.5 fL (ref 80.0–100.0)
Monocytes Absolute: 1.2 10*3/uL — ABNORMAL HIGH (ref 0.1–1.0)
Monocytes Relative: 15 %
Neutro Abs: 5.4 10*3/uL (ref 1.7–7.7)
Neutrophils Relative %: 67 %
Platelet Count: 258 10*3/uL (ref 150–400)
RBC: 3.62 MIL/uL — ABNORMAL LOW (ref 4.22–5.81)
RDW: 12.5 % (ref 11.5–15.5)
WBC Count: 8 10*3/uL (ref 4.0–10.5)
nRBC: 0 % (ref 0.0–0.2)

## 2022-01-28 LAB — CMP (CANCER CENTER ONLY)
ALT: 12 U/L (ref 0–44)
AST: 20 U/L (ref 15–41)
Albumin: 3.6 g/dL (ref 3.5–5.0)
Alkaline Phosphatase: 48 U/L (ref 38–126)
Anion gap: 7 (ref 5–15)
BUN: 24 mg/dL — ABNORMAL HIGH (ref 8–23)
CO2: 26 mmol/L (ref 22–32)
Calcium: 9 mg/dL (ref 8.9–10.3)
Chloride: 104 mmol/L (ref 98–111)
Creatinine: 1.64 mg/dL — ABNORMAL HIGH (ref 0.61–1.24)
GFR, Estimated: 43 mL/min — ABNORMAL LOW (ref >60)
Glucose, Bld: 116 mg/dL — ABNORMAL HIGH (ref 70–99)
Potassium: 4 mmol/L (ref 3.5–5.1)
Sodium: 137 mmol/L (ref 135–145)
Total Bilirubin: 0.7 mg/dL (ref 0.3–1.2)
Total Protein: 7.9 g/dL (ref 6.5–8.1)

## 2022-01-28 LAB — TSH: TSH: 2.403 u[IU]/mL (ref 0.350–4.500)

## 2022-01-28 MED ORDER — HEPARIN SOD (PORK) LOCK FLUSH 100 UNIT/ML IV SOLN
500.0000 [IU] | Freq: Once | INTRAVENOUS | Status: AC | PRN
  Administered 2022-01-28: 500 [IU]

## 2022-01-28 MED ORDER — SODIUM CHLORIDE 0.9 % IV SOLN
150.0000 mg | Freq: Once | INTRAVENOUS | Status: AC
  Administered 2022-01-28: 150 mg via INTRAVENOUS
  Filled 2022-01-28: qty 150

## 2022-01-28 MED ORDER — SODIUM CHLORIDE 0.9 % IV SOLN: Freq: Once | INTRAVENOUS | Status: AC

## 2022-01-28 MED ORDER — SODIUM CHLORIDE 0.9% FLUSH
10.0000 mL | Freq: Once | INTRAVENOUS | Status: AC
  Administered 2022-01-28: 10 mL

## 2022-01-28 MED ORDER — SODIUM CHLORIDE 0.9 % IV SOLN
200.0000 mg | Freq: Once | INTRAVENOUS | Status: AC
  Administered 2022-01-28: 200 mg via INTRAVENOUS
  Filled 2022-01-28: qty 200

## 2022-01-28 MED ORDER — PALONOSETRON HCL INJECTION 0.25 MG/5ML
0.2500 mg | Freq: Once | INTRAVENOUS | Status: AC
  Administered 2022-01-28: 0.25 mg via INTRAVENOUS
  Filled 2022-01-28: qty 5

## 2022-01-28 MED ORDER — SODIUM CHLORIDE 0.9 % IV SOLN
500.0000 mg/m2 | Freq: Once | INTRAVENOUS | Status: AC
  Administered 2022-01-28: 1000 mg via INTRAVENOUS
  Filled 2022-01-28: qty 40

## 2022-01-28 MED ORDER — SODIUM CHLORIDE 0.9 % IV SOLN
10.0000 mg | Freq: Once | INTRAVENOUS | Status: AC
  Administered 2022-01-28: 10 mg via INTRAVENOUS
  Filled 2022-01-28: qty 10

## 2022-01-28 MED ORDER — SODIUM CHLORIDE 0.9% FLUSH
10.0000 mL | INTRAVENOUS | Status: DC | PRN
  Administered 2022-01-28: 10 mL

## 2022-01-28 MED ORDER — SODIUM CHLORIDE 0.9 % IV SOLN
350.0000 mg | Freq: Once | INTRAVENOUS | Status: AC
  Administered 2022-01-28: 350 mg via INTRAVENOUS
  Filled 2022-01-28: qty 35

## 2022-01-28 NOTE — Progress Notes (Signed)
Per Dr. Julien Nordmann - okay to proceed with treatment today with CMP results from 6/26.

## 2022-01-28 NOTE — Progress Notes (Signed)
Per PA Cassie will dose Carboplatin based on calculated dose.   Larene Beach, PharmD

## 2022-01-28 NOTE — Patient Instructions (Addendum)
[facility]   Discharge Instructions: Thank you for choosing [facility] to provide your oncology and hematology care.   If you have a lab appointment with the [facility], please go directly to the [facility] and check in at the registration area.   Wear comfortable clothing and clothing appropriate for easy access to any Portacath or PICC line.   We strive to give you quality time with your provider. You may need to reschedule your appointment if you arrive late (15 or more minutes).  Arriving late affects you and other patients whose appointments are after yours.  Also, if you miss three or more appointments without notifying the office, you may be dismissed from the clinic at the provider's discretion.      For prescription refill requests, have your pharmacy contact our office and allow 72 hours for refills to be completed.    Today you received the following chemotherapy and/or immunotherapy agents  Pembrolizumab (Keytruda),  Pemetrexed (Alimta), Carboplatin    To help prevent nausea and vomiting after your treatment, we encourage you to take your nausea medication as directed.  BELOW ARE SYMPTOMS THAT SHOULD BE REPORTED IMMEDIATELY: *FEVER GREATER THAN 100.4 F (38 C) OR HIGHER *CHILLS OR SWEATING *NAUSEA AND VOMITING THAT IS NOT CONTROLLED WITH YOUR NAUSEA MEDICATION *UNUSUAL SHORTNESS OF BREATH *UNUSUAL BRUISING OR BLEEDING *URINARY PROBLEMS (pain or burning when urinating, or frequent urination) *BOWEL PROBLEMS (unusual diarrhea, constipation, pain near the anus) TENDERNESS IN MOUTH AND THROAT WITH OR WITHOUT PRESENCE OF ULCERS (sore throat, sores in mouth, or a toothache) UNUSUAL RASH, SWELLING OR PAIN  UNUSUAL VAGINAL DISCHARGE OR ITCHING   Items with * indicate a potential emergency and should be followed up as soon as possible or go to the Emergency Department if any problems should occur.  Please show the CHEMOTHERAPY  ALERT CARD or IMMUNOTHERAPY ALERT CARD at check-in to the Emergency Department and triage nurse.  Should you have questions after your visit or need to cancel or reschedule your appointment, please contact [facility]  Dept: [phone]  and follow the prompts.  Office hours are 8:00 a.m. to 4:30 p.m. Monday - Friday. Please note that voicemails left after 4:00 p.m. may not be returned until the following business day.  We are closed weekends and major holidays. You have access to a nurse at all times for urgent questions. Please call the main number to the clinic Dept: [phone] and follow the prompts.   For any non-urgent questions, you may also contact your provider using MyChart. We now offer e-Visits for anyone 33 and older to request care online for non-urgent symptoms. For details visit [email or web address].   Also download the MyChart app! Go to the app store, search "MyChart", open the app, select [facility], and log in with your MyChart username and password.  Masks are optional in the cancer centers. If you would like for your care team to wear a mask while they are taking care of you, please let them know. For doctor visits, patients may have with them one support person who is at least 76 years old. At this time, visitors are not allowed in the infusion area.  Pembrolizumab Beryle Flock) What is this medication? PEMBROLIZUMAB (pem broe liz ue mab) is a monoclonal antibody. It is used to treat certain types of cancer. This medicine may be used for other purposes; ask your health care provider or pharmacist if you have questions. COMMON  BRAND NAME(S): Keytruda What should I tell my care team before I take this medication? They need to know if you have any of these conditions: autoimmune diseases like Crohn's disease, ulcerative colitis, or lupus have had or planning to have an allogeneic stem cell transplant (uses someone else's stem cells) history of  organ transplant history of chest radiation nervous system problems like myasthenia gravis or Guillain-Barre syndrome an unusual or allergic reaction to pembrolizumab, other medicines, foods, dyes, or preservatives pregnant or trying to get pregnant breast-feeding How should I use this medication? This medicine is for infusion into a vein. It is given by a health care professional in a hospital or clinic setting. A special MedGuide will be given to you before each treatment. Be sure to read this information carefully each time. Talk to your pediatrician regarding the use of this medicine in children. While this drug may be prescribed for children as young as 6 months for selected conditions, precautions do apply. Overdosage: If you think you have taken too much of this medicine contact a poison control center or emergency room at once. NOTE: This medicine is only for you. Do not share this medicine with others. What if I miss a dose? It is important not to miss your dose. Call your doctor or health care professional if you are unable to keep an appointment. What may interact with this medication? Interactions have not been studied. This list may not describe all possible interactions. Give your health care provider a list of all the medicines, herbs, non-prescription drugs, or dietary supplements you use. Also tell them if you smoke, drink alcohol, or use illegal drugs. Some items may interact with your medicine. What should I watch for while using this medication? Your condition will be monitored carefully while you are receiving this medicine. You may need blood work done while you are taking this medicine. Do not become pregnant while taking this medicine or for 4 months after stopping it. Women should inform their doctor if they wish to become pregnant or think they might be pregnant. There is a potential for serious side effects to an unborn child. Talk to your health care professional or  pharmacist for more information. Do not breast-feed an infant while taking this medicine or for 4 months after the last dose. What side effects may I notice from receiving this medication? Side effects that you should report to your doctor or health care professional as soon as possible: allergic reactions like skin rash, itching or hives, swelling of the face, lips, or tongue bloody or black, tarry breathing problems changes in vision chest pain chills confusion constipation cough diarrhea dizziness or feeling faint or lightheaded fast or irregular heartbeat fever flushing joint pain low blood counts - this medicine may decrease the number of white blood cells, red blood cells and platelets. You may be at increased risk for infections and bleeding. muscle pain muscle weakness pain, tingling, numbness in the hands or feet persistent headache redness, blistering, peeling or loosening of the skin, including inside the mouth signs and symptoms of high blood sugar such as dizziness; dry mouth; dry skin; fruity breath; nausea; stomach pain; increased hunger or thirst; increased urination signs and symptoms of kidney injury like trouble passing urine or change in the amount of urine signs and symptoms of liver injury like dark urine, light-colored stools, loss of appetite, nausea, right upper belly pain, yellowing of the eyes or skin sweating swollen lymph nodes weight loss Side effects that usually  do not require medical attention (report to your doctor or health care professional if they continue or are bothersome): decreased appetite hair loss tiredness This list may not describe all possible side effects. Call your doctor for medical advice about side effects. You may report side effects to FDA at 1-800-FDA-1088. Where should I keep my medication? This drug is given in a hospital or clinic and will not be stored at home. NOTE: This sheet is a summary. It may not cover all possible  information. If you have questions about this medicine, talk to your doctor, pharmacist, or health care provider.  2023 Elsevier/Gold Standard (2021-06-13 00:00:00) Pemetrexed (Alimta) What is this medication? PEMETREXED (PEM e TREX ed) is a chemotherapy drug used to treat lung cancers like non-small cell lung cancer and mesothelioma. It may also be used to treat other cancers. This medicine may be used for other purposes; ask your health care provider or pharmacist if you have questions. COMMON BRAND NAME(S): Alimta, PEMFEXY What should I tell my care team before I take this medication? They need to know if you have any of these conditions: infection (especially a virus infection such as chickenpox, cold sores, or herpes) kidney disease low blood counts, like low white cell, platelet, or red cell counts lung or breathing disease, like asthma radiation therapy an unusual or allergic reaction to pemetrexed, other medicines, foods, dyes, or preservative pregnant or trying to get pregnant breast-feeding How should I use this medication? This drug is given as an infusion into a vein. It is administered in a hospital or clinic by a specially trained health care professional. Talk to your pediatrician regarding the use of this medicine in children. Special care may be needed. Overdosage: If you think you have taken too much of this medicine contact a poison control center or emergency room at once. NOTE: This medicine is only for you. Do not share this medicine with others. What if I miss a dose? It is important not to miss your dose. Call your doctor or health care professional if you are unable to keep an appointment. What may interact with this medication? This medicine may interact with the following medications: Ibuprofen This list may not describe all possible interactions. Give your health care provider a list of all the medicines, herbs, non-prescription drugs, or dietary supplements  you use. Also tell them if you smoke, drink alcohol, or use illegal drugs. Some items may interact with your medicine. What should I watch for while using this medication? Visit your doctor for checks on your progress. This drug may make you feel generally unwell. This is not uncommon, as chemotherapy can affect healthy cells as well as cancer cells. Report any side effects. Continue your course of treatment even though you feel ill unless your doctor tells you to stop. In some cases, you may be given additional medicines to help with side effects. Follow all directions for their use. Call your doctor or health care professional for advice if you get a fever, chills or sore throat, or other symptoms of a cold or flu. Do not treat yourself. This drug decreases your body's ability to fight infections. Try to avoid being around people who are sick. This medicine may increase your risk to bruise or bleed. Call your doctor or health care professional if you notice any unusual bleeding. Be careful brushing and flossing your teeth or using a toothpick because you may get an infection or bleed more easily. If you have any dental work  done, tell your dentist you are receiving this medicine. Avoid taking products that contain aspirin, acetaminophen, ibuprofen, naproxen, or ketoprofen unless instructed by your doctor. These medicines may hide a fever. Call your doctor or health care professional if you get diarrhea or mouth sores. Do not treat yourself. To protect your kidneys, drink water or other fluids as directed while you are taking this medicine. Do not become pregnant while taking this medicine or for 6 months after stopping it. Women should inform their doctor if they wish to become pregnant or think they might be pregnant. Men should not father a child while taking this medicine and for 3 months after stopping it. This may interfere with the ability to father a child. You should talk to your doctor or health  care professional if you are concerned about your fertility. There is a potential for serious side effects to an unborn child. Talk to your health care professional or pharmacist for more information. Do not breast-feed an infant while taking this medicine or for 1 week after stopping it. What side effects may I notice from receiving this medication? Side effects that you should report to your doctor or health care professional as soon as possible: allergic reactions like skin rash, itching or hives, swelling of the face, lips, or tongue breathing problems redness, blistering, peeling or loosening of the skin, including inside the mouth signs and symptoms of bleeding such as bloody or black, tarry stools; red or dark-brown urine; spitting up blood or brown material that looks like coffee grounds; red spots on the skin; unusual bruising or bleeding from the eye, gums, or nose signs and symptoms of infection like fever or chills; cough; sore throat; pain or trouble passing urine signs and symptoms of kidney injury like trouble passing urine or change in the amount of urine signs and symptoms of liver injury like dark yellow or brown urine; general ill feeling or flu-like symptoms; light-colored stools; loss of appetite; nausea; right upper belly pain; unusually weak or tired; yellowing of the eyes or skin Side effects that usually do not require medical attention (report to your doctor or health care professional if they continue or are bothersome): constipation mouth sores nausea, vomiting unusually weak or tired This list may not describe all possible side effects. Call your doctor for medical advice about side effects. You may report side effects to FDA at 1-800-FDA-1088. Where should I keep my medication? This drug is given in a hospital or clinic and will not be stored at home. NOTE: This sheet is a summary. It may not cover all possible information. If you have questions about this medicine,  talk to your doctor, pharmacist, or health care provider.  2023 Elsevier/Gold Standard (2017-09-07 00:00:00)  Carboplatin injection What is this medication? CARBOPLATIN (KAR boe pla tin) is a chemotherapy drug. It targets fast dividing cells, like cancer cells, and causes these cells to die. This medicine is used to treat ovarian cancer and many other cancers. This medicine may be used for other purposes; ask your health care provider or pharmacist if you have questions. COMMON BRAND NAME(S): Paraplatin What should I tell my care team before I take this medication? They need to know if you have any of these conditions: blood disorders hearing problems kidney disease recent or ongoing radiation therapy an unusual or allergic reaction to carboplatin, cisplatin, other chemotherapy, other medicines, foods, dyes, or preservatives pregnant or trying to get pregnant breast-feeding How should I use this medication? This drug is  usually given as an infusion into a vein. It is administered in a hospital or clinic by a specially trained health care professional. Talk to your pediatrician regarding the use of this medicine in children. Special care may be needed. Overdosage: If you think you have taken too much of this medicine contact a poison control center or emergency room at once. NOTE: This medicine is only for you. Do not share this medicine with others. What if I miss a dose? It is important not to miss a dose. Call your doctor or health care professional if you are unable to keep an appointment. What may interact with this medication? medicines for seizures medicines to increase blood counts like filgrastim, pegfilgrastim, sargramostim some antibiotics like amikacin, gentamicin, neomycin, streptomycin, tobramycin vaccines Talk to your doctor or health care professional before taking any of these medicines: acetaminophen aspirin ibuprofen ketoprofen naproxen This list may not describe  all possible interactions. Give your health care provider a list of all the medicines, herbs, non-prescription drugs, or dietary supplements you use. Also tell them if you smoke, drink alcohol, or use illegal drugs. Some items may interact with your medicine. What should I watch for while using this medication? Your condition will be monitored carefully while you are receiving this medicine. You will need important blood work done while you are taking this medicine. This drug may make you feel generally unwell. This is not uncommon, as chemotherapy can affect healthy cells as well as cancer cells. Report any side effects. Continue your course of treatment even though you feel ill unless your doctor tells you to stop. In some cases, you may be given additional medicines to help with side effects. Follow all directions for their use. Call your doctor or health care professional for advice if you get a fever, chills or sore throat, or other symptoms of a cold or flu. Do not treat yourself. This drug decreases your body's ability to fight infections. Try to avoid being around people who are sick. This medicine may increase your risk to bruise or bleed. Call your doctor or health care professional if you notice any unusual bleeding. Be careful brushing and flossing your teeth or using a toothpick because you may get an infection or bleed more easily. If you have any dental work done, tell your dentist you are receiving this medicine. Avoid taking products that contain aspirin, acetaminophen, ibuprofen, naproxen, or ketoprofen unless instructed by your doctor. These medicines may hide a fever. Do not become pregnant while taking this medicine. Women should inform their doctor if they wish to become pregnant or think they might be pregnant. There is a potential for serious side effects to an unborn child. Talk to your health care professional or pharmacist for more information. Do not breast-feed an infant while  taking this medicine. What side effects may I notice from receiving this medication? Side effects that you should report to your doctor or health care professional as soon as possible: allergic reactions like skin rash, itching or hives, swelling of the face, lips, or tongue signs of infection - fever or chills, cough, sore throat, pain or difficulty passing urine signs of decreased platelets or bleeding - bruising, pinpoint red spots on the skin, black, tarry stools, nosebleeds signs of decreased red blood cells - unusually weak or tired, fainting spells, lightheadedness breathing problems changes in hearing changes in vision chest pain high blood pressure low blood counts - This drug may decrease the number of white blood cells, red  blood cells and platelets. You may be at increased risk for infections and bleeding. nausea and vomiting pain, swelling, redness or irritation at the injection site pain, tingling, numbness in the hands or feet problems with balance, talking, walking trouble passing urine or change in the amount of urine Side effects that usually do not require medical attention (report to your doctor or health care professional if they continue or are bothersome): hair loss loss of appetite metallic taste in the mouth or changes in taste This list may not describe all possible side effects. Call your doctor for medical advice about side effects. You may report side effects to FDA at 1-800-FDA-1088. Where should I keep my medication? This drug is given in a hospital or clinic and will not be stored at home. NOTE: This sheet is a summary. It may not cover all possible information. If you have questions about this medicine, talk to your doctor, pharmacist, or health care provider.  2023 Elsevier/Gold Standard (2007-12-21 00:00:00)

## 2022-01-29 ENCOUNTER — Telehealth: Payer: Self-pay

## 2022-01-29 NOTE — Telephone Encounter (Signed)
-----   Message from Jeremiah Gilbert, RN sent at 01/28/2022  3:45 PM EDT ----- Regarding: First Time Keytruda, Alimta, Carboplatin - Dr. Julien Nordmann Patient First Time Keytruda, Alimta, Carboplatin - Dr. Julien Nordmann Patient Patient tolerated treatment well.

## 2022-01-29 NOTE — Telephone Encounter (Signed)
[name] states that he is feeling fine.  He is eating, drinking and urinating well. He knows to call the office at [phone] if he has any questions or concerns.

## 2022-02-05 ENCOUNTER — Inpatient Hospital Stay: Payer: Medicare Other

## 2022-02-05 ENCOUNTER — Other Ambulatory Visit: Payer: Medicare Other

## 2022-02-05 ENCOUNTER — Other Ambulatory Visit: Payer: Self-pay

## 2022-02-05 ENCOUNTER — Inpatient Hospital Stay (HOSPITAL_BASED_OUTPATIENT_CLINIC_OR_DEPARTMENT_OTHER): Payer: Medicare Other | Admitting: Internal Medicine

## 2022-02-05 VITALS — BP 129/82 | HR 94 | Temp 97.5°F | Resp 18 | Wt 171.4 lb

## 2022-02-05 DIAGNOSIS — C3412 Malignant neoplasm of upper lobe, left bronchus or lung: Secondary | ICD-10-CM

## 2022-02-05 DIAGNOSIS — Z5112 Encounter for antineoplastic immunotherapy: Secondary | ICD-10-CM | POA: Diagnosis not present

## 2022-02-05 DIAGNOSIS — Z95828 Presence of other vascular implants and grafts: Secondary | ICD-10-CM

## 2022-02-05 DIAGNOSIS — Z5111 Encounter for antineoplastic chemotherapy: Secondary | ICD-10-CM

## 2022-02-05 DIAGNOSIS — C3492 Malignant neoplasm of unspecified part of left bronchus or lung: Secondary | ICD-10-CM

## 2022-02-05 DIAGNOSIS — R5382 Chronic fatigue, unspecified: Secondary | ICD-10-CM

## 2022-02-05 LAB — CMP (CANCER CENTER ONLY)
ALT: 15 U/L (ref 0–44)
AST: 23 U/L (ref 15–41)
Albumin: 3.7 g/dL (ref 3.5–5.0)
Alkaline Phosphatase: 49 U/L (ref 38–126)
Anion gap: 7 (ref 5–15)
BUN: 30 mg/dL — ABNORMAL HIGH (ref 8–23)
CO2: 27 mmol/L (ref 22–32)
Calcium: 9.4 mg/dL (ref 8.9–10.3)
Chloride: 102 mmol/L (ref 98–111)
Creatinine: 1.16 mg/dL (ref 0.61–1.24)
GFR, Estimated: >60 mL/min (ref >60)
Glucose, Bld: 108 mg/dL — ABNORMAL HIGH (ref 70–99)
Potassium: 4.1 mmol/L (ref 3.5–5.1)
Sodium: 136 mmol/L (ref 135–145)
Total Bilirubin: 0.9 mg/dL (ref 0.3–1.2)
Total Protein: 7.5 g/dL (ref 6.5–8.1)

## 2022-02-05 LAB — CBC WITH DIFFERENTIAL (CANCER CENTER ONLY)
Abs Immature Granulocytes: 0.02 10*3/uL (ref 0.00–0.07)
Basophils Absolute: 0 10*3/uL (ref 0.0–0.1)
Basophils Relative: 0 %
Eosinophils Absolute: 0.1 10*3/uL (ref 0.0–0.5)
Eosinophils Relative: 5 %
HCT: 34.6 % — ABNORMAL LOW (ref 39.0–52.0)
Hemoglobin: 12.2 g/dL — ABNORMAL LOW (ref 13.0–17.0)
Immature Granulocytes: 1 %
Lymphocytes Relative: 27 %
Lymphs Abs: 0.7 10*3/uL (ref 0.7–4.0)
MCH: 33.7 pg (ref 26.0–34.0)
MCHC: 35.3 g/dL (ref 30.0–36.0)
MCV: 95.6 fL (ref 80.0–100.0)
Monocytes Absolute: 0.1 10*3/uL (ref 0.1–1.0)
Monocytes Relative: 4 %
Neutro Abs: 1.5 10*3/uL — ABNORMAL LOW (ref 1.7–7.7)
Neutrophils Relative %: 63 %
Platelet Count: 119 10*3/uL — ABNORMAL LOW (ref 150–400)
RBC: 3.62 MIL/uL — ABNORMAL LOW (ref 4.22–5.81)
RDW: 11.8 % (ref 11.5–15.5)
WBC Count: 2.5 10*3/uL — ABNORMAL LOW (ref 4.0–10.5)
nRBC: 0 % (ref 0.0–0.2)

## 2022-02-05 MED ORDER — METHYLPREDNISOLONE 4 MG PO TBPK: ORAL_TABLET | ORAL | 0 refills | Status: DC

## 2022-02-05 MED ORDER — HEPARIN SOD (PORK) LOCK FLUSH 100 UNIT/ML IV SOLN
500.0000 [IU] | Freq: Once | INTRAVENOUS | Status: AC
  Administered 2022-02-05: 500 [IU]

## 2022-02-05 MED ORDER — SODIUM CHLORIDE 0.9% FLUSH
10.0000 mL | Freq: Once | INTRAVENOUS | Status: AC
  Administered 2022-02-05: 10 mL

## 2022-02-05 NOTE — Addendum Note (Signed)
Addended by: Ardeen Garland on: 02/05/2022 10:26 AM   Modules accepted: Orders

## 2022-02-05 NOTE — Progress Notes (Signed)
    [facility] [phone]   [phone]  OFFICE PROGRESS NOTE  Bedsole, Amy E, MD [address] [place] [address]  DIAGNOSIS: stage IV (T1c, N2, M1 a) non-small cell lung cancer, adenocarcinoma presented with left lingular nodule in addition to pleural nodularity and thickening throughout the left hemothorax as well as malignant left pleural effusion and pericardiac lymphadenopathy diagnosed in May 2023. The patient also has a history of prostate adenocarcinoma status post prostatectomy in 2003 and also history of multiple myeloma currently in remission diagnosed in 2012.  Molecular studies by foundation 1 showed no actionable mutations. ATM R2993* , CDKN2A/B, NFE2L2  PRIOR THERAPY: None  CURRENT THERAPY: Systemic chemotherapy with carboplatin for AUC of 5, Alimta 500 Mg/M2 and Keytruda 200 Mg IV every 3 weeks.  First dose January 29, 2022.  Status post 1 cycle  INTERVAL HISTORY: [name] 76 y.o. male returns to the clinic today for follow-up visit accompanied by his wife.  The patient is complaining of increasing fatigue and weakness started after the first cycle of his treatment.  He felt much better on day one of the treatment likely because of the steroid premedication.  He is complaining of shortness of breath with exertion as well as worsening back pain.  He is currently on fentanyl patch 75 mcg/hour every 3 days prescribed by the palliative care team at [facility].  The patient denied having any chest pain but has cough with no hemoptysis.  He has no nausea, vomiting, diarrhea or constipation.  He has no headache or visual changes.  He is here today for evaluation and repeat blood work.  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   DDD (degenerative disc disease)    Fractures    compression T12,L3   GERD (gastroesophageal reflux disease)    History of chemotherapy    weekly Velcade,Cytoxan   HTN (hypertension)    Hypercalcemia of malignancy    Hyperlipemia    Multiple myeloma 07/09/2011   Multiple myeloma    Myeloma kidney (HCC)    Peripheral neuropathy    toes, sees Dr Yan   Prostate cancer (HCC) 2008   s/p prostatectomy   Prostate cancer (HCC) 03/2002   Renal insufficiency     ALLERGIES:  is allergic to celebrex [celecoxib], cymbalta [duloxetine hcl], hydromorphone hcl, meloxicam, oxycontin [oxycodone], percocet [oxycodone-acetaminophen], vicodin [hydrocodone-acetaminophen], and lyrica [pregabalin].  MEDICATIONS:  Current Outpatient Medications  Medication Sig Dispense Refill   aspirin 81 MG tablet Take 81 mg by mouth daily.     B Complex Vitamins (VITAMIN-B COMPLEX) TABS Take by mouth.     Calcium Carbonate-Vitamin D 600-400 MG-UNIT per tablet Take by mouth.     cholecalciferol (VITAMIN D) 1000 UNITS tablet Take 1,000 Units by mouth daily.     diclofenac Sodium (VOLTAREN) 1 % GEL Apply 4 g topically 4 (four) times daily. 150 g 1   fentaNYL (DURAGESIC) 75 MCG/HR Place 1 patch onto the skin every 3 (three) days.     folic acid (FOLVITE) 1 MG tablet Take 1 tablet (1 mg total) by mouth daily. 30 tablet 4   lidocaine-prilocaine (EMLA) cream Apply topically as needed. Apply to port site one hour before treatment   and cover with plastic wrap 30 g 2   lidocaine-prilocaine (EMLA) cream Apply to the Port-A-Cath site 30 minutes before treatment 30 g 0   mupirocin ointment (BACTROBAN) 2 % Apply 1 application topically 2 (two) times daily. 22 g 6   NONFORMULARY OR COMPOUNDED ITEM Baclofn/amitrp/ketamn cream topically to feet TID prn for neuropathy     NUBEQA 300 MG tablet Take 600 mg by mouth 2 (two) times daily.     oxyCODONE (OXY IR/ROXICODONE) 5 MG immediate release tablet TK  1 T PO Q 4 H PRN P  0   pantoprazole (PROTONIX) 40 MG tablet TAKE 1 TABLET BY MOUTH EVERY DAY 90 tablet 3   prochlorperazine (COMPAZINE) 10 MG tablet Take 1 tablet (10 mg total) by mouth every 6 (six) hours as needed for nausea or vomiting. 30 tablet 0   No current facility-administered medications for this visit.    SURGICAL HISTORY:  Past Surgical History:  Procedure Laterality Date   BASAL CELL CARCINOMA EXCISION Left 10/22/2020   [facility] (Dr. Leonie Man)   CATARACT EXTRACTION W/ INTRAOCULAR LENS IMPLANT Bilateral    DEXA  05/2011   spine 0.7, L femur 0.0, R femur -0.6, normal   KIDNEY STONE SURGERY     KIDNEY STONE SURGERY  06/2009   Stone removed from bladder   MELANOMA EXCISION Left 06/05/2019   Procedure: REEXCISION MELANOMA OF LEFT POSTERIOR AURICLE;  Surgeon: Johnathan Hausen, MD;  Location: [facility];  Service: General;  Laterality: Left;   PORTACATH PLACEMENT  08/05/11   right sided portacath   PROSTATECTOMY  04/06/07   TOE AMPUTATION Left 2017   2nd toe left foot    REVIEW OF SYSTEMS:  Constitutional: positive for anorexia, fatigue, and weight loss Eyes: negative Ears, nose, mouth, throat, and face: negative Respiratory: positive for cough and dyspnea on exertion Cardiovascular: negative Gastrointestinal: negative Genitourinary:negative Integument/breast: negative Hematologic/lymphatic: negative Musculoskeletal:positive for back pain Neurological: negative Behavioral/Psych: negative Endocrine: negative Allergic/Immunologic: negative   PHYSICAL EXAMINATION: General appearance: alert, cooperative, fatigued, and no distress Head: Normocephalic, without obvious abnormality, atraumatic Neck: no adenopathy, no JVD, supple, symmetrical, trachea midline, and thyroid not enlarged, symmetric, no tenderness/mass/nodules Lymph nodes: Cervical, supraclavicular, and axillary nodes normal. Resp: diminished breath sounds LLL and dullness to percussion LLL Back:  symmetric, no curvature. ROM normal. No CVA tenderness. Cardio: regular rate and rhythm, S1, S2 normal, no murmur, click, rub or gallop GI: soft, non-tender; bowel sounds normal; no masses,  no organomegaly Extremities: extremities normal, atraumatic, no cyanosis or edema Neurologic: Alert and oriented X 3, normal strength and tone. Normal symmetric reflexes. Normal coordination and gait  ECOG PERFORMANCE STATUS: 1 - Symptomatic but completely ambulatory  Blood pressure 129/82, pulse 94, temperature (!) 97.5 F (36.4 C), temperature source Oral, resp. rate 18, weight 171 lb 7 oz (77.8 kg), SpO2 99 %.  LABORATORY DATA: Lab Results  Component Value Date   WBC 2.5 (L) 02/05/2022   HGB 12.2 (L) 02/05/2022   HCT 34.6 (L) 02/05/2022   MCV 95.6 02/05/2022   PLT 119 (L) 02/05/2022      Chemistry      Component Value Date/Time   NA 137 01/28/2022 1338   NA 139 05/14/2017 1145   K 4.0 01/28/2022 1338   K 3.8 05/14/2017 1145   CL 104 01/28/2022 1338   CL 108 (H) 09/30/2013 0945   CL 110 (H) 12/29/2012 1151   CO2 26 01/28/2022 1338   CO2 27 05/14/2017 1145   BUN  24 (H) 01/28/2022 1338   BUN 17.9 05/14/2017 1145   CREATININE 1.64 (H) 01/28/2022 1338   CREATININE 1.3 05/14/2017 1145      Component Value Date/Time   CALCIUM 9.0 01/28/2022 1338   CALCIUM 9.3 05/14/2017 1145   ALKPHOS 48 01/28/2022 1338   ALKPHOS 45 05/14/2017 1145   AST 20 01/28/2022 1338   AST 19 05/14/2017 1145   ALT 12 01/28/2022 1338   ALT 18 05/14/2017 1145   BILITOT 0.7 01/28/2022 1338   BILITOT 0.67 05/14/2017 1145       RADIOGRAPHIC STUDIES: No results found.  ASSESSMENT AND PLAN: This is a very pleasant 76 years old white male with stage IV (T1c, N2, M a) non-small cell lung cancer, adenocarcinoma presented with left lingular nodule in addition to pleural nodularity and thickening throughout the left hemothorax as well as malignant left pleural effusion and pericardiac lymphadenopathy diagnosed in May  2023.  Molecular studies by foundation 1 showed no actionable mutations The patient also has a history of prostate adenocarcinoma status post prostatectomy in 2003 and also history of multiple myeloma currently in remission diagnosed in 2012. The patient is currently undergoing systemic chemotherapy with carboplatin for AUC of 5, Alimta 500 Mg/M2 and Keytruda 200 Mg IV every 3 weeks status post 1 cycle started on January 29, 2022. The patient is complaining of increasing fatigue and weakness as well as the persistent back pain.  He also has lack of appetite and weight loss. His CBC today showed mild anemia, leukocytopenia and thrombocytopenia secondary to his treatment. I recommended for the patient to continue his treatment as planned and he is expected to start cycle #2 in 2 weeks. For the lack of appetite and generalized weakness, I will give him a Medrol Dosepak.  I also encouraged the patient to increase his oral intake. He will come back for follow-up visit in 2 weeks for evaluation before starting cycle #2. He was advised to call immediately if he has any other concerning symptoms in the interval.  The patient voices understanding of current disease status and treatment options and is in agreement with the current care plan.  All questions were answered. The patient knows to call the clinic with any problems, questions or concerns. We can certainly see the patient much sooner if necessary.  The total time spent in the appointment was 30 minutes.  Disclaimer: This note was dictated with voice recognition software. Similar sounding words can inadvertently be transcribed and may not be corrected upon review.

## 2022-02-12 ENCOUNTER — Other Ambulatory Visit: Payer: Self-pay

## 2022-02-12 ENCOUNTER — Inpatient Hospital Stay: Payer: Medicare Other

## 2022-02-12 ENCOUNTER — Other Ambulatory Visit: Payer: Medicare Other

## 2022-02-12 DIAGNOSIS — C3492 Malignant neoplasm of unspecified part of left bronchus or lung: Secondary | ICD-10-CM

## 2022-02-12 DIAGNOSIS — Z5112 Encounter for antineoplastic immunotherapy: Secondary | ICD-10-CM | POA: Diagnosis not present

## 2022-02-12 DIAGNOSIS — R5382 Chronic fatigue, unspecified: Secondary | ICD-10-CM

## 2022-02-12 DIAGNOSIS — Z95828 Presence of other vascular implants and grafts: Secondary | ICD-10-CM

## 2022-02-12 LAB — CBC WITH DIFFERENTIAL (CANCER CENTER ONLY)
Abs Immature Granulocytes: 0 10*3/uL (ref 0.00–0.07)
Basophils Absolute: 0 10*3/uL (ref 0.0–0.1)
Basophils Relative: 0 %
Eosinophils Absolute: 0.1 10*3/uL (ref 0.0–0.5)
Eosinophils Relative: 4 %
HCT: 29.5 % — ABNORMAL LOW (ref 39.0–52.0)
Hemoglobin: 10.7 g/dL — ABNORMAL LOW (ref 13.0–17.0)
Immature Granulocytes: 0 %
Lymphocytes Relative: 48 %
Lymphs Abs: 1.1 10*3/uL (ref 0.7–4.0)
MCH: 33.4 pg (ref 26.0–34.0)
MCHC: 36.3 g/dL — ABNORMAL HIGH (ref 30.0–36.0)
MCV: 92.2 fL (ref 80.0–100.0)
Monocytes Absolute: 0.5 10*3/uL (ref 0.1–1.0)
Monocytes Relative: 24 %
Neutro Abs: 0.5 10*3/uL — ABNORMAL LOW (ref 1.7–7.7)
Neutrophils Relative %: 24 %
Platelet Count: 17 10*3/uL — ABNORMAL LOW (ref 150–400)
RBC: 3.2 MIL/uL — ABNORMAL LOW (ref 4.22–5.81)
RDW: 11 % — ABNORMAL LOW (ref 11.5–15.5)
WBC Count: 2.2 10*3/uL — ABNORMAL LOW (ref 4.0–10.5)
nRBC: 0 % (ref 0.0–0.2)

## 2022-02-12 LAB — CMP (CANCER CENTER ONLY)
ALT: 17 U/L (ref 0–44)
AST: 20 U/L (ref 15–41)
Albumin: 3.6 g/dL (ref 3.5–5.0)
Alkaline Phosphatase: 44 U/L (ref 38–126)
Anion gap: 9 (ref 5–15)
BUN: 27 mg/dL — ABNORMAL HIGH (ref 8–23)
CO2: 29 mmol/L (ref 22–32)
Calcium: 9.3 mg/dL (ref 8.9–10.3)
Chloride: 96 mmol/L — ABNORMAL LOW (ref 98–111)
Creatinine: 1.22 mg/dL (ref 0.61–1.24)
GFR, Estimated: >60 mL/min (ref >60)
Glucose, Bld: 120 mg/dL — ABNORMAL HIGH (ref 70–99)
Potassium: 3.6 mmol/L (ref 3.5–5.1)
Sodium: 134 mmol/L — ABNORMAL LOW (ref 135–145)
Total Bilirubin: 0.6 mg/dL (ref 0.3–1.2)
Total Protein: 6.8 g/dL (ref 6.5–8.1)

## 2022-02-12 MED ORDER — SODIUM CHLORIDE 0.9% FLUSH
10.0000 mL | Freq: Once | INTRAVENOUS | Status: AC
  Administered 2022-02-12: 10 mL

## 2022-02-12 MED ORDER — HEPARIN SOD (PORK) LOCK FLUSH 100 UNIT/ML IV SOLN
500.0000 [IU] | Freq: Once | INTRAVENOUS | Status: AC
  Administered 2022-02-12: 500 [IU]

## 2022-02-13 ENCOUNTER — Telehealth: Payer: Self-pay | Admitting: *Deleted

## 2022-02-13 NOTE — Telephone Encounter (Signed)
Per Dr.Mohamed, called pt spouse to discuss bleeding precautions and results. Advised to call office to make aware. Pt spouse verbalized understanding.

## 2022-02-13 NOTE — Telephone Encounter (Signed)
-----   Message from Curt Bears, MD sent at 02/13/2022  8:46 AM EDT ----- Please call the patient with bleeding precautions.  His platelets are low. ----- Message ----- From: Interface, Lab In [place] Sent: 02/12/2022   1:51 PM EDT To: Curt Bears, MD

## 2022-02-16 ENCOUNTER — Other Ambulatory Visit: Payer: Self-pay

## 2022-02-17 NOTE — Progress Notes (Unsigned)
[facility] OFFICE PROGRESS NOTE  Jinny Sanders, MD [place] [address]  DIAGNOSIS:  Stage IV (T1c, N2, M1 a) non-small cell lung cancer, adenocarcinoma presented with left lingular nodule in addition to pleural nodularity and thickening throughout the left hemothorax as well as malignant left pleural effusion and pericardiac lymphadenopathy diagnosed in May 2023. The patient also has a history of prostate adenocarcinoma status post prostatectomy in 2003 and also history of multiple myeloma currently in remission diagnosed in 2012.   Molecular studies by foundation 1 showed no actionable mutations. ATM N1916* , CDKN2A/B, NFE2L2  PRIOR THERAPY: None  CURRENT THERAPY:  Systemic chemotherapy with carboplatin for AUC of 5, Alimta 500 Mg/M2 and Keytruda 200 Mg IV every 3 weeks.  First dose January 29, 2022.  Status post 1 cycle.  Starting from cycle #2, carboplatin dose reduced to an AUC of 4 due to pancytopenia  INTERVAL HISTORY: [name] 76 y.o. male returns to the clinic today for a follow-up visit accompanied by his significant other.  The patient is currently undergoing palliative systemic chemotherapy.  He status post 1 cycle and had challenging time with treatment due to fatigue and weakness.  He also reports decreased appetite and weight loss.  He is not interested in seeing a member the nutritionist team.  He is drinking Premier protein drinks once a day.  He reportedly sleeps most of the day.  He also had pancytopenia on labs.  He denies any signs and symptoms of infection or any abnormal bleeding or bruising.  The patient also has back pain for which he is prescribed 75 mcg of fentanyl by the palliative care team at [facility].  He reports his breathing is "better".  He reports improvement in his dyspnea on exertion.  He has a mild cough. He denies any fever, chills, or night sweats. He denies any hemoptysis or chest pain.  Denies any  nausea, vomiting, diarrhea, or constipation.  He denies any headache or visual changes.  Denies any rashes or skin changes.  The patient is here today for evaluation and repeat blood work before undergoing cycle #2.    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   diffuse scattered osseous metastatic disease   DDD (degenerative disc disease)    Fractures    compression T12,L3   GERD (gastroesophageal reflux disease)    History of chemotherapy    weekly Velcade,Cytoxan   HTN (hypertension)    Hypercalcemia of malignancy    Hyperlipemia    Multiple myeloma 07/09/2011   Multiple myeloma    Myeloma kidney (HCC)    Peripheral neuropathy    toes, sees Dr Krista Blue   Prostate cancer [facility]) 2008   s/p prostatectomy   Prostate cancer ([facility]) 03/2002   Renal insufficiency     ALLERGIES:  is allergic to celebrex [celecoxib], cymbalta [duloxetine hcl], hydromorphone hcl, meloxicam, oxycontin [oxycodone], percocet [oxycodone-acetaminophen], vicodin [hydrocodone-acetaminophen], and lyrica [pregabalin].  MEDICATIONS:  Current Outpatient Medications  Medication Sig Dispense Refill   potassium chloride SA (KLOR-CON M) 20 MEQ tablet Take 1 tablet (20 mEq total) by mouth daily. 6 tablet 0   aspirin 81 MG tablet Take 81 mg by mouth daily.     B Complex Vitamins (VITAMIN-B COMPLEX) TABS Take  by mouth.     Calcium Carbonate-Vitamin D 600-400 MG-UNIT per tablet Take by mouth.     cholecalciferol (VITAMIN D) 1000 UNITS tablet Take 1,000 Units by mouth daily.     diclofenac Sodium (VOLTAREN) 1 % GEL Apply 4 g topically 4 (four) times daily. 150 g 1   fentaNYL (DURAGESIC) 75 MCG/HR Place 1 patch onto the skin every 3 (three) days.     folic acid (FOLVITE) 1 MG tablet Take 1 tablet (1 mg total) by mouth daily. 30 tablet 4    lidocaine-prilocaine (EMLA) cream Apply topically as needed. Apply to port site one hour before treatment and cover with plastic wrap 30 g 2   methylPREDNISolone (MEDROL DOSEPAK) 4 MG TBPK tablet Use as instructed 21 tablet 0   mupirocin ointment (BACTROBAN) 2 % Apply 1 application topically 2 (two) times daily. 22 g 6   NONFORMULARY OR COMPOUNDED ITEM Baclofn/amitrp/ketamn cream topically to feet TID prn for neuropathy     NUBEQA 300 MG tablet Take 600 mg by mouth 2 (two) times daily.     oxyCODONE (OXY IR/ROXICODONE) 5 MG immediate release tablet TK 1 T PO Q 4 H PRN P  0   pantoprazole (PROTONIX) 40 MG tablet TAKE 1 TABLET BY MOUTH EVERY DAY 90 tablet 3   prochlorperazine (COMPAZINE) 10 MG tablet Take 1 tablet (10 mg total) by mouth every 6 (six) hours as needed for nausea or vomiting. 30 tablet 0   No current facility-administered medications for this visit.    SURGICAL HISTORY:  Past Surgical History:  Procedure Laterality Date   BASAL CELL CARCINOMA EXCISION Left 10/22/2020   [facility] (Dr. Leonie Man)   CATARACT EXTRACTION W/ INTRAOCULAR LENS IMPLANT Bilateral    DEXA  05/2011   spine 0.7, L femur 0.0, R femur -0.6, normal   KIDNEY STONE SURGERY     KIDNEY STONE SURGERY  06/2009   Stone removed from bladder   MELANOMA EXCISION Left 06/05/2019   Procedure: REEXCISION MELANOMA OF LEFT POSTERIOR AURICLE;  Surgeon: Johnathan Hausen, MD;  Location: [facility];  Service: General;  Laterality: Left;   PORTACATH PLACEMENT  08/05/11   right sided portacath   PROSTATECTOMY  04/06/07   TOE AMPUTATION Left 2017   2nd toe left foot    REVIEW OF SYSTEMS:   Review of Systems  Constitutional: Positive for fatigue, decreased appetite, weight loss.  Negative for chills and  fever.  HENT: Negative for mouth sores, nosebleeds, sore throat and trouble swallowing.   Eyes: Negative for eye problems and icterus.  Respiratory:  Positive for dyspnea on exertion (improved compared to prior )  positive for mild cough.  Negative for hemoptysis, and wheezing.   Cardiovascular: Negative for chest pain and leg swelling.  Gastrointestinal: Negative for abdominal pain, constipation, diarrhea, nausea and vomiting.  Genitourinary: Negative for bladder incontinence, difficulty urinating, dysuria, frequency and hematuria.   Musculoskeletal: Positive for back pain.  Negative for gait problem, neck pain and neck stiffness.  Skin: Negative for itching and rash.  Neurological: Negative for dizziness, extremity weakness, gait problem, headaches, light-headedness and seizures.  Hematological: Negative for adenopathy. Does not bruise/bleed easily.  Psychiatric/Behavioral: Negative for confusion, depression and sleep disturbance. The patient is not nervous/anxious.     PHYSICAL EXAMINATION:  Blood pressure 111/69, pulse 86, temperature 97.8 F (36.6 C), temperature source Oral, resp. rate 16, weight 167 lb 11.2 oz (76.1 kg), SpO2 94 %.  ECOG PERFORMANCE STATUS: 2-3  Physical Exam  Constitutional:  Oriented to person, place, and time and thin appearing male and in no distress.  HENT:  Head: Normocephalic and atraumatic.  Mouth/Throat: Oropharynx is clear and moist. No oropharyngeal exudate.  Eyes: Conjunctivae are normal. Right eye exhibits no discharge. Left eye exhibits no discharge. No scleral icterus.  Neck: Normal range of motion. Neck supple.  Cardiovascular: Normal rate, regular rhythm, normal heart sounds and intact distal pulses.   Pulmonary/Chest: Effort normal and breath sounds normal. No respiratory distress. No wheezes. No rales.  Abdominal: Soft. Bowel sounds are normal. Exhibits no distension and no mass. There is no tenderness.  Musculoskeletal: Normal range of motion. Exhibits no edema.  Lymphadenopathy:    No cervical adenopathy.  Neurological: Alert and oriented to person, place, and time.  Exhibits muscle wasting.  Examined in the wheelchair.  Skin: Skin is warm and dry. No  rash noted. Not diaphoretic. No erythema. Positive for pallor.  Psychiatric: Mood, memory and judgment normal.  Vitals reviewed.  LABORATORY DATA: Lab Results  Component Value Date   WBC 4.0 02/19/2022   HGB 10.1 (L) 02/19/2022   HCT 27.9 (L) 02/19/2022   MCV 92.4 02/19/2022   PLT 227 02/19/2022      Chemistry      Component Value Date/Time   NA 136 02/19/2022 0800   NA 139 05/14/2017 1145   K 3.2 (L) 02/19/2022 0800   K 3.8 05/14/2017 1145   CL 98 02/19/2022 0800   CL 108 (H) 09/30/2013 0945   CL 110 (H) 12/29/2012 1151   CO2 29 02/19/2022 0800   CO2 27 05/14/2017 1145   BUN 15 02/19/2022 0800   BUN 17.9 05/14/2017 1145   CREATININE 1.10 02/19/2022 0800   CREATININE 1.3 05/14/2017 1145      Component Value Date/Time   CALCIUM 8.9 02/19/2022 0800   CALCIUM 9.3 05/14/2017 1145   ALKPHOS 44 02/19/2022 0800   ALKPHOS 45 05/14/2017 1145   AST 23 02/19/2022 0800   AST 19 05/14/2017 1145   ALT 13 02/19/2022 0800   ALT 18 05/14/2017 1145   BILITOT 0.4 02/19/2022 0800   BILITOT 0.67 05/14/2017 1145       RADIOGRAPHIC STUDIES:  No results found.   ASSESSMENT/PLAN:  This is a very pleasant 76 year old Caucasian male diagnosed with stage IV (T1c, N2, M1A) non-small cell lung cancer, adenocarcinoma.  The patient presented with a left lingular nodule in addition to pleural nodularity and thickening throughout the left hemithorax as well as a malignant left pleural effusion.  The patient also has pericardiac lymphadenopathy.  He was diagnosed in May 2023.  His molecular studies by foundation 1 showed no actionable mutations.  The patient also has a history of prostate adenocarcinoma.  He is status post prostatectomy in 2003.  He also has a history of multiple myeloma and is currently in remission.  He was diagnosed in 2012.  The patient is currently undergoing palliative systemic chemotherapy with carboplatin for an AUC of 5, Alimta 500 mg per metered squared, and Keytruda  200 mg IV every 3 weeks.  The patient is status post 1 cycle.  He had pancytopenia and significant fatigue and weakness with cycle #1.  His first dose of treatment was on 01/29/2022.  The patient was seen with Dr. Julien Nordmann today.  The patient's chemotherapy dose was reviewed with Dr. Julien Nordmann.  Due to the patient's pancytopenia, Dr. Julien Nordmann is going to reduce his dose of carboplatin to an AUC of 4.  The patient's labs did recover nicely;  however, I did review with the patient and his significant other that this can happen again.  I did strongly encourage them to wait in the lobby 10 to 15 minutes after having his weekly labs drawn to ensure he does not need any supportive care before driving back home to [place].  I just asked that they please notify staff member to let me or Dr. Julien Nordmann know that they are waiting in the lobby for the results.    Labs were reviewed.  He will proceed with cycle #2 today scheduled with carboplatin for an AUC of 4, Alimta 500 mg per metered square, Keytruda 200 mg   We will continue to monitor his labs closely on a weekly basis.  His potassium is slightly low.  I sent a prescription for 20 mEq of potassium to take once daily for 6 days.  The patient lost a significant amount of weight since last being seen.  The patient is not interested in referral to nutrition at this time.  I encouraged him to increase his supplemental protein drink and take 2 to 3/day.  We will see him back for follow-up visit in 3 weeks for evaluation and repeat blood work before starting cycle #3.  The patient was advised to call immediately if he has any concerning symptoms in the interval. The patient voices understanding of current disease status and treatment options and is in agreement with the current care plan. All questions were answered. The patient knows to call the clinic with any problems, questions or concerns. We can certainly see the patient much sooner if necessary  No orders of  the defined types were placed in this encounter.    Loisann Roach L Nateisha Moyd, PA-C 02/19/22  ADDENDUM: Hematology/Oncology Attending: I had a face-to-face encounter with the patient today.  I reviewed his record, lab and recommended his care plan.  This is a very pleasant 76 years old white male diagnosed with a stage IV non-small cell lung cancer, adenocarcinoma in May 2023.  The patient also has a history of prostate adenocarcinoma status post prostatectomy and history of multiple myeloma currently in remission. He is currently undergoing systemic chemotherapy with carboplatin, Alimta and Keytruda.  He tolerated the first cycle of his treatment well except for pancytopenia from his treatment. I recommended for the patient to proceed with cycle #2 today but I will reduce the dose of carboplatin to AUC of 4. We will continue to monitor his blood count closely. For the hypokalemia we will give the patient prescription for potassium chloride supplements. He will come back for follow-up visit in 3 weeks for evaluation before the next cycle of his treatment. The patient was advised to call immediately if he has any other concerning symptoms in the interval.  The total time spent in the appointment was 30 minutes. Disclaimer: This note was dictated with voice recognition software. Similar sounding words can inadvertently be transcribed and may be missed upon review. Eilleen Kempf, MD

## 2022-02-18 ENCOUNTER — Ambulatory Visit: Payer: Medicare Other | Admitting: Podiatry

## 2022-02-18 MED FILL — Dexamethasone Sodium Phosphate Inj 100 MG/10ML: INTRAMUSCULAR | Qty: 1 | Status: AC

## 2022-02-18 MED FILL — Fosaprepitant Dimeglumine For IV Infusion 150 MG (Base Eq): INTRAVENOUS | Qty: 5 | Status: AC

## 2022-02-19 ENCOUNTER — Other Ambulatory Visit: Payer: Self-pay

## 2022-02-19 ENCOUNTER — Inpatient Hospital Stay: Payer: Medicare Other

## 2022-02-19 ENCOUNTER — Inpatient Hospital Stay (HOSPITAL_BASED_OUTPATIENT_CLINIC_OR_DEPARTMENT_OTHER): Payer: Medicare Other | Admitting: Physician Assistant

## 2022-02-19 ENCOUNTER — Other Ambulatory Visit: Payer: Medicare Other

## 2022-02-19 VITALS — BP 111/69 | HR 86 | Temp 97.8°F | Resp 16 | Wt 167.7 lb

## 2022-02-19 DIAGNOSIS — Z5111 Encounter for antineoplastic chemotherapy: Secondary | ICD-10-CM | POA: Diagnosis not present

## 2022-02-19 DIAGNOSIS — C61 Malignant neoplasm of prostate: Secondary | ICD-10-CM | POA: Diagnosis not present

## 2022-02-19 DIAGNOSIS — R5382 Chronic fatigue, unspecified: Secondary | ICD-10-CM

## 2022-02-19 DIAGNOSIS — E876 Hypokalemia: Secondary | ICD-10-CM

## 2022-02-19 DIAGNOSIS — C9002 Multiple myeloma in relapse: Secondary | ICD-10-CM

## 2022-02-19 DIAGNOSIS — C3492 Malignant neoplasm of unspecified part of left bronchus or lung: Secondary | ICD-10-CM

## 2022-02-19 DIAGNOSIS — C3412 Malignant neoplasm of upper lobe, left bronchus or lung: Secondary | ICD-10-CM

## 2022-02-19 DIAGNOSIS — Z95828 Presence of other vascular implants and grafts: Secondary | ICD-10-CM

## 2022-02-19 DIAGNOSIS — Z5112 Encounter for antineoplastic immunotherapy: Secondary | ICD-10-CM | POA: Diagnosis not present

## 2022-02-19 LAB — CMP (CANCER CENTER ONLY)
ALT: 13 U/L (ref 0–44)
AST: 23 U/L (ref 15–41)
Albumin: 3.4 g/dL — ABNORMAL LOW (ref 3.5–5.0)
Alkaline Phosphatase: 44 U/L (ref 38–126)
Anion gap: 9 (ref 5–15)
BUN: 15 mg/dL (ref 8–23)
CO2: 29 mmol/L (ref 22–32)
Calcium: 8.9 mg/dL (ref 8.9–10.3)
Chloride: 98 mmol/L (ref 98–111)
Creatinine: 1.1 mg/dL (ref 0.61–1.24)
GFR, Estimated: >60 mL/min (ref >60)
Glucose, Bld: 107 mg/dL — ABNORMAL HIGH (ref 70–99)
Potassium: 3.2 mmol/L — ABNORMAL LOW (ref 3.5–5.1)
Sodium: 136 mmol/L (ref 135–145)
Total Bilirubin: 0.4 mg/dL (ref 0.3–1.2)
Total Protein: 6.7 g/dL (ref 6.5–8.1)

## 2022-02-19 LAB — CBC WITH DIFFERENTIAL (CANCER CENTER ONLY)
Abs Immature Granulocytes: 0.03 10*3/uL (ref 0.00–0.07)
Basophils Absolute: 0 10*3/uL (ref 0.0–0.1)
Basophils Relative: 0 %
Eosinophils Absolute: 0 10*3/uL (ref 0.0–0.5)
Eosinophils Relative: 1 %
HCT: 27.9 % — ABNORMAL LOW (ref 39.0–52.0)
Hemoglobin: 10.1 g/dL — ABNORMAL LOW (ref 13.0–17.0)
Immature Granulocytes: 1 %
Lymphocytes Relative: 20 %
Lymphs Abs: 0.8 10*3/uL (ref 0.7–4.0)
MCH: 33.4 pg (ref 26.0–34.0)
MCHC: 36.2 g/dL — ABNORMAL HIGH (ref 30.0–36.0)
MCV: 92.4 fL (ref 80.0–100.0)
Monocytes Absolute: 1.1 10*3/uL — ABNORMAL HIGH (ref 0.1–1.0)
Monocytes Relative: 28 %
Neutro Abs: 2 10*3/uL (ref 1.7–7.7)
Neutrophils Relative %: 50 %
Platelet Count: 227 10*3/uL (ref 150–400)
RBC: 3.02 MIL/uL — ABNORMAL LOW (ref 4.22–5.81)
RDW: 11.2 % — ABNORMAL LOW (ref 11.5–15.5)
WBC Count: 4 10*3/uL (ref 4.0–10.5)
nRBC: 0 % (ref 0.0–0.2)

## 2022-02-19 LAB — TSH: TSH: 2.09 u[IU]/mL (ref 0.350–4.500)

## 2022-02-19 MED ORDER — SODIUM CHLORIDE 0.9 % IV SOLN
200.0000 mg | Freq: Once | INTRAVENOUS | Status: AC
  Administered 2022-02-19: 200 mg via INTRAVENOUS
  Filled 2022-02-19: qty 200

## 2022-02-19 MED ORDER — SODIUM CHLORIDE 0.9 % IV SOLN
500.0000 mg/m2 | Freq: Once | INTRAVENOUS | Status: AC
  Administered 2022-02-19: 1000 mg via INTRAVENOUS
  Filled 2022-02-19: qty 40

## 2022-02-19 MED ORDER — POTASSIUM CHLORIDE CRYS ER 20 MEQ PO TBCR: 20.0000 meq | EXTENDED_RELEASE_TABLET | Freq: Every day | ORAL | 0 refills | Status: DC

## 2022-02-19 MED ORDER — SODIUM CHLORIDE 0.9% FLUSH
10.0000 mL | INTRAVENOUS | Status: DC | PRN
  Administered 2022-02-19: 10 mL

## 2022-02-19 MED ORDER — SODIUM CHLORIDE 0.9 % IV SOLN
150.0000 mg | Freq: Once | INTRAVENOUS | Status: AC
  Administered 2022-02-19: 150 mg via INTRAVENOUS
  Filled 2022-02-19: qty 150

## 2022-02-19 MED ORDER — SODIUM CHLORIDE 0.9 % IV SOLN: Freq: Once | INTRAVENOUS | Status: AC

## 2022-02-19 MED ORDER — SODIUM CHLORIDE 0.9 % IV SOLN
10.0000 mg | Freq: Once | INTRAVENOUS | Status: AC
  Administered 2022-02-19: 10 mg via INTRAVENOUS
  Filled 2022-02-19: qty 10

## 2022-02-19 MED ORDER — SODIUM CHLORIDE 0.9% FLUSH
10.0000 mL | Freq: Once | INTRAVENOUS | Status: AC
  Administered 2022-02-19: 10 mL

## 2022-02-19 MED ORDER — PALONOSETRON HCL INJECTION 0.25 MG/5ML
0.2500 mg | Freq: Once | INTRAVENOUS | Status: AC
  Administered 2022-02-19: 0.25 mg via INTRAVENOUS
  Filled 2022-02-19: qty 5

## 2022-02-19 MED ORDER — HEPARIN SOD (PORK) LOCK FLUSH 100 UNIT/ML IV SOLN
500.0000 [IU] | Freq: Once | INTRAVENOUS | Status: AC | PRN
  Administered 2022-02-19: 500 [IU]

## 2022-02-19 MED ORDER — SODIUM CHLORIDE 0.9 % IV SOLN
350.0000 mg | Freq: Once | INTRAVENOUS | Status: AC
  Administered 2022-02-19: 350 mg via INTRAVENOUS
  Filled 2022-02-19: qty 35

## 2022-02-19 NOTE — Patient Instructions (Signed)
[facility]   Discharge Instructions: Thank you for choosing [facility] to provide your oncology and hematology care.   If you have a lab appointment with the [facility], please go directly to the [facility] and check in at the registration area.   Wear comfortable clothing and clothing appropriate for easy access to any Portacath or PICC line.   We strive to give you quality time with your provider. You may need to reschedule your appointment if you arrive late (15 or more minutes).  Arriving late affects you and other patients whose appointments are after yours.  Also, if you miss three or more appointments without notifying the office, you may be dismissed from the clinic at the provider's discretion.      For prescription refill requests, have your pharmacy contact our office and allow 72 hours for refills to be completed.    Today you received the following chemotherapy and/or immunotherapy agents  Pembrolizumab (Keytruda),  Pemetrexed (Alimta), Carboplatin    To help prevent nausea and vomiting after your treatment, we encourage you to take your nausea medication as directed.  BELOW ARE SYMPTOMS THAT SHOULD BE REPORTED IMMEDIATELY: *FEVER GREATER THAN 100.4 F (38 C) OR HIGHER *CHILLS OR SWEATING *NAUSEA AND VOMITING THAT IS NOT CONTROLLED WITH YOUR NAUSEA MEDICATION *UNUSUAL SHORTNESS OF BREATH *UNUSUAL BRUISING OR BLEEDING *URINARY PROBLEMS (pain or burning when urinating, or frequent urination) *BOWEL PROBLEMS (unusual diarrhea, constipation, pain near the anus) TENDERNESS IN MOUTH AND THROAT WITH OR WITHOUT PRESENCE OF ULCERS (sore throat, sores in mouth, or a toothache) UNUSUAL RASH, SWELLING OR PAIN  UNUSUAL VAGINAL DISCHARGE OR ITCHING   Items with * indicate a potential emergency and should be followed up as soon as possible or go to the Emergency Department if any problems should occur.  Please show the CHEMOTHERAPY  ALERT CARD or IMMUNOTHERAPY ALERT CARD at check-in to the Emergency Department and triage nurse.  Should you have questions after your visit or need to cancel or reschedule your appointment, please contact [facility]  Dept: [phone]  and follow the prompts.  Office hours are 8:00 a.m. to 4:30 p.m. Monday - Friday. Please note that voicemails left after 4:00 p.m. may not be returned until the following business day.  We are closed weekends and major holidays. You have access to a nurse at all times for urgent questions. Please call the main number to the clinic Dept: [phone] and follow the prompts.   For any non-urgent questions, you may also contact your provider using MyChart. We now offer e-Visits for anyone 85 and older to request care online for non-urgent symptoms. For details visit [email or web address].   Also download the MyChart app! Go to the app store, search "MyChart", open the app, select [facility], and log in with your MyChart username and password.  Masks are optional in the cancer centers. If you would like for your care team to wear a mask while they are taking care of you, please let them know. For doctor visits, patients may have with them one support Millard Bautch who is at least 76 years old. At this time, visitors are not allowed in the infusion area.  Pembrolizumab Beryle Flock) What is this medication? PEMBROLIZUMAB (pem broe liz ue mab) is a monoclonal antibody. It is used to treat certain types of cancer. This medicine may be used for other purposes; ask your health care provider or pharmacist if you have questions. COMMON  BRAND NAME(S): Keytruda What should I tell my care team before I take this medication? They need to know if you have any of these conditions: autoimmune diseases like Crohn's disease, ulcerative colitis, or lupus have had or planning to have an allogeneic stem cell transplant (uses someone else's stem cells) history of  organ transplant history of chest radiation nervous system problems like myasthenia gravis or Guillain-Barre syndrome an unusual or allergic reaction to pembrolizumab, other medicines, foods, dyes, or preservatives pregnant or trying to get pregnant breast-feeding How should I use this medication? This medicine is for infusion into a vein. It is given by a health care professional in a hospital or clinic setting. A special MedGuide will be given to you before each treatment. Be sure to read this information carefully each time. Talk to your pediatrician regarding the use of this medicine in children. While this drug may be prescribed for children as young as 6 months for selected conditions, precautions do apply. Overdosage: If you think you have taken too much of this medicine contact a poison control center or emergency room at once. NOTE: This medicine is only for you. Do not share this medicine with others. What if I miss a dose? It is important not to miss your dose. Call your doctor or health care professional if you are unable to keep an appointment. What may interact with this medication? Interactions have not been studied. This list may not describe all possible interactions. Give your health care provider a list of all the medicines, herbs, non-prescription drugs, or dietary supplements you use. Also tell them if you smoke, drink alcohol, or use illegal drugs. Some items may interact with your medicine. What should I watch for while using this medication? Your condition will be monitored carefully while you are receiving this medicine. You may need blood work done while you are taking this medicine. Do not become pregnant while taking this medicine or for 4 months after stopping it. Women should inform their doctor if they wish to become pregnant or think they might be pregnant. There is a potential for serious side effects to an unborn child. Talk to your health care professional or  pharmacist for more information. Do not breast-feed an infant while taking this medicine or for 4 months after the last dose. What side effects may I notice from receiving this medication? Side effects that you should report to your doctor or health care professional as soon as possible: allergic reactions like skin rash, itching or hives, swelling of the face, lips, or tongue bloody or black, tarry breathing problems changes in vision chest pain chills confusion constipation cough diarrhea dizziness or feeling faint or lightheaded fast or irregular heartbeat fever flushing joint pain low blood counts - this medicine may decrease the number of white blood cells, red blood cells and platelets. You may be at increased risk for infections and bleeding. muscle pain muscle weakness pain, tingling, numbness in the hands or feet persistent headache redness, blistering, peeling or loosening of the skin, including inside the mouth signs and symptoms of high blood sugar such as dizziness; dry mouth; dry skin; fruity breath; nausea; stomach pain; increased hunger or thirst; increased urination signs and symptoms of kidney injury like trouble passing urine or change in the amount of urine signs and symptoms of liver injury like dark urine, light-colored stools, loss of appetite, nausea, right upper belly pain, yellowing of the eyes or skin sweating swollen lymph nodes weight loss Side effects that usually  do not require medical attention (report to your doctor or health care professional if they continue or are bothersome): decreased appetite hair loss tiredness This list may not describe all possible side effects. Call your doctor for medical advice about side effects. You may report side effects to FDA at 1-800-FDA-1088. Where should I keep my medication? This drug is given in a hospital or clinic and will not be stored at home. NOTE: This sheet is a summary. It may not cover all possible  information. If you have questions about this medicine, talk to your doctor, pharmacist, or health care provider.  2023 Elsevier/Gold Standard (2021-06-13 00:00:00) Pemetrexed (Alimta) What is this medication? PEMETREXED (PEM e TREX ed) is a chemotherapy drug used to treat lung cancers like non-small cell lung cancer and mesothelioma. It may also be used to treat other cancers. This medicine may be used for other purposes; ask your health care provider or pharmacist if you have questions. COMMON BRAND NAME(S): Alimta, PEMFEXY What should I tell my care team before I take this medication? They need to know if you have any of these conditions: infection (especially a virus infection such as chickenpox, cold sores, or herpes) kidney disease low blood counts, like low white cell, platelet, or red cell counts lung or breathing disease, like asthma radiation therapy an unusual or allergic reaction to pemetrexed, other medicines, foods, dyes, or preservative pregnant or trying to get pregnant breast-feeding How should I use this medication? This drug is given as an infusion into a vein. It is administered in a hospital or clinic by a specially trained health care professional. Talk to your pediatrician regarding the use of this medicine in children. Special care may be needed. Overdosage: If you think you have taken too much of this medicine contact a poison control center or emergency room at once. NOTE: This medicine is only for you. Do not share this medicine with others. What if I miss a dose? It is important not to miss your dose. Call your doctor or health care professional if you are unable to keep an appointment. What may interact with this medication? This medicine may interact with the following medications: Ibuprofen This list may not describe all possible interactions. Give your health care provider a list of all the medicines, herbs, non-prescription drugs, or dietary supplements  you use. Also tell them if you smoke, drink alcohol, or use illegal drugs. Some items may interact with your medicine. What should I watch for while using this medication? Visit your doctor for checks on your progress. This drug may make you feel generally unwell. This is not uncommon, as chemotherapy can affect healthy cells as well as cancer cells. Report any side effects. Continue your course of treatment even though you feel ill unless your doctor tells you to stop. In some cases, you may be given additional medicines to help with side effects. Follow all directions for their use. Call your doctor or health care professional for advice if you get a fever, chills or sore throat, or other symptoms of a cold or flu. Do not treat yourself. This drug decreases your body's ability to fight infections. Try to avoid being around people who are sick. This medicine may increase your risk to bruise or bleed. Call your doctor or health care professional if you notice any unusual bleeding. Be careful brushing and flossing your teeth or using a toothpick because you may get an infection or bleed more easily. If you have any dental work  done, tell your dentist you are receiving this medicine. Avoid taking products that contain aspirin, acetaminophen, ibuprofen, naproxen, or ketoprofen unless instructed by your doctor. These medicines may hide a fever. Call your doctor or health care professional if you get diarrhea or mouth sores. Do not treat yourself. To protect your kidneys, drink water or other fluids as directed while you are taking this medicine. Do not become pregnant while taking this medicine or for 6 months after stopping it. Women should inform their doctor if they wish to become pregnant or think they might be pregnant. Men should not father a child while taking this medicine and for 3 months after stopping it. This may interfere with the ability to father a child. You should talk to your doctor or health  care professional if you are concerned about your fertility. There is a potential for serious side effects to an unborn child. Talk to your health care professional or pharmacist for more information. Do not breast-feed an infant while taking this medicine or for 1 week after stopping it. What side effects may I notice from receiving this medication? Side effects that you should report to your doctor or health care professional as soon as possible: allergic reactions like skin rash, itching or hives, swelling of the face, lips, or tongue breathing problems redness, blistering, peeling or loosening of the skin, including inside the mouth signs and symptoms of bleeding such as bloody or black, tarry stools; red or dark-brown urine; spitting up blood or brown material that looks like coffee grounds; red spots on the skin; unusual bruising or bleeding from the eye, gums, or nose signs and symptoms of infection like fever or chills; cough; sore throat; pain or trouble passing urine signs and symptoms of kidney injury like trouble passing urine or change in the amount of urine signs and symptoms of liver injury like dark yellow or brown urine; general ill feeling or flu-like symptoms; light-colored stools; loss of appetite; nausea; right upper belly pain; unusually weak or tired; yellowing of the eyes or skin Side effects that usually do not require medical attention (report to your doctor or health care professional if they continue or are bothersome): constipation mouth sores nausea, vomiting unusually weak or tired This list may not describe all possible side effects. Call your doctor for medical advice about side effects. You may report side effects to FDA at 1-800-FDA-1088. Where should I keep my medication? This drug is given in a hospital or clinic and will not be stored at home. NOTE: This sheet is a summary. It may not cover all possible information. If you have questions about this medicine,  talk to your doctor, pharmacist, or health care provider.  2023 Elsevier/Gold Standard (2017-09-07 00:00:00)  Carboplatin injection What is this medication? CARBOPLATIN (KAR boe pla tin) is a chemotherapy drug. It targets fast dividing cells, like cancer cells, and causes these cells to die. This medicine is used to treat ovarian cancer and many other cancers. This medicine may be used for other purposes; ask your health care provider or pharmacist if you have questions. COMMON BRAND NAME(S): Paraplatin What should I tell my care team before I take this medication? They need to know if you have any of these conditions: blood disorders hearing problems kidney disease recent or ongoing radiation therapy an unusual or allergic reaction to carboplatin, cisplatin, other chemotherapy, other medicines, foods, dyes, or preservatives pregnant or trying to get pregnant breast-feeding How should I use this medication? This drug is  usually given as an infusion into a vein. It is administered in a hospital or clinic by a specially trained health care professional. Talk to your pediatrician regarding the use of this medicine in children. Special care may be needed. Overdosage: If you think you have taken too much of this medicine contact a poison control center or emergency room at once. NOTE: This medicine is only for you. Do not share this medicine with others. What if I miss a dose? It is important not to miss a dose. Call your doctor or health care professional if you are unable to keep an appointment. What may interact with this medication? medicines for seizures medicines to increase blood counts like filgrastim, pegfilgrastim, sargramostim some antibiotics like amikacin, gentamicin, neomycin, streptomycin, tobramycin vaccines Talk to your doctor or health care professional before taking any of these medicines: acetaminophen aspirin ibuprofen ketoprofen naproxen This list may not describe  all possible interactions. Give your health care provider a list of all the medicines, herbs, non-prescription drugs, or dietary supplements you use. Also tell them if you smoke, drink alcohol, or use illegal drugs. Some items may interact with your medicine. What should I watch for while using this medication? Your condition will be monitored carefully while you are receiving this medicine. You will need important blood work done while you are taking this medicine. This drug may make you feel generally unwell. This is not uncommon, as chemotherapy can affect healthy cells as well as cancer cells. Report any side effects. Continue your course of treatment even though you feel ill unless your doctor tells you to stop. In some cases, you may be given additional medicines to help with side effects. Follow all directions for their use. Call your doctor or health care professional for advice if you get a fever, chills or sore throat, or other symptoms of a cold or flu. Do not treat yourself. This drug decreases your body's ability to fight infections. Try to avoid being around people who are sick. This medicine may increase your risk to bruise or bleed. Call your doctor or health care professional if you notice any unusual bleeding. Be careful brushing and flossing your teeth or using a toothpick because you may get an infection or bleed more easily. If you have any dental work done, tell your dentist you are receiving this medicine. Avoid taking products that contain aspirin, acetaminophen, ibuprofen, naproxen, or ketoprofen unless instructed by your doctor. These medicines may hide a fever. Do not become pregnant while taking this medicine. Women should inform their doctor if they wish to become pregnant or think they might be pregnant. There is a potential for serious side effects to an unborn child. Talk to your health care professional or pharmacist for more information. Do not breast-feed an infant while  taking this medicine. What side effects may I notice from receiving this medication? Side effects that you should report to your doctor or health care professional as soon as possible: allergic reactions like skin rash, itching or hives, swelling of the face, lips, or tongue signs of infection - fever or chills, cough, sore throat, pain or difficulty passing urine signs of decreased platelets or bleeding - bruising, pinpoint red spots on the skin, black, tarry stools, nosebleeds signs of decreased red blood cells - unusually weak or tired, fainting spells, lightheadedness breathing problems changes in hearing changes in vision chest pain high blood pressure low blood counts - This drug may decrease the number of white blood cells, red  blood cells and platelets. You may be at increased risk for infections and bleeding. nausea and vomiting pain, swelling, redness or irritation at the injection site pain, tingling, numbness in the hands or feet problems with balance, talking, walking trouble passing urine or change in the amount of urine Side effects that usually do not require medical attention (report to your doctor or health care professional if they continue or are bothersome): hair loss loss of appetite metallic taste in the mouth or changes in taste This list may not describe all possible side effects. Call your doctor for medical advice about side effects. You may report side effects to FDA at 1-800-FDA-1088. Where should I keep my medication? This drug is given in a hospital or clinic and will not be stored at home. NOTE: This sheet is a summary. It may not cover all possible information. If you have questions about this medicine, talk to your doctor, pharmacist, or health care provider.  2023 Elsevier/Gold Standard (2007-12-21 00:00:00)

## 2022-02-25 ENCOUNTER — Emergency Department: Payer: Medicare Other

## 2022-02-25 ENCOUNTER — Inpatient Hospital Stay
Admission: EM | Admit: 2022-02-25 | Discharge: 2022-02-28 | DRG: 871 | Disposition: A | Discharge status: Home / Self Care | Payer: Medicare Other | Attending: Internal Medicine | Admitting: Internal Medicine

## 2022-02-25 ENCOUNTER — Encounter: Payer: Self-pay | Admitting: Emergency Medicine

## 2022-02-25 DIAGNOSIS — Z885 Allergy status to narcotic agent status: Secondary | ICD-10-CM

## 2022-02-25 DIAGNOSIS — K219 Gastro-esophageal reflux disease without esophagitis: Secondary | ICD-10-CM | POA: Diagnosis present

## 2022-02-25 DIAGNOSIS — Z95828 Presence of other vascular implants and grafts: Secondary | ICD-10-CM

## 2022-02-25 DIAGNOSIS — Z8546 Personal history of malignant neoplasm of prostate: Secondary | ICD-10-CM

## 2022-02-25 DIAGNOSIS — Z888 Allergy status to other drugs, medicaments and biological substances status: Secondary | ICD-10-CM

## 2022-02-25 DIAGNOSIS — Z6820 Body mass index (BMI) 20.0-20.9, adult: Secondary | ICD-10-CM

## 2022-02-25 DIAGNOSIS — D72819 Decreased white blood cell count, unspecified: Secondary | ICD-10-CM

## 2022-02-25 DIAGNOSIS — R6510 Systemic inflammatory response syndrome (SIRS) of non-infectious origin without acute organ dysfunction: Secondary | ICD-10-CM

## 2022-02-25 DIAGNOSIS — C3492 Malignant neoplasm of unspecified part of left bronchus or lung: Secondary | ICD-10-CM

## 2022-02-25 DIAGNOSIS — Z87891 Personal history of nicotine dependence: Secondary | ICD-10-CM

## 2022-02-25 DIAGNOSIS — Z79899 Other long term (current) drug therapy: Secondary | ICD-10-CM

## 2022-02-25 DIAGNOSIS — Z859 Personal history of malignant neoplasm, unspecified: Secondary | ICD-10-CM

## 2022-02-25 DIAGNOSIS — Z85820 Personal history of malignant melanoma of skin: Secondary | ICD-10-CM

## 2022-02-25 DIAGNOSIS — E872 Acidosis, unspecified: Secondary | ICD-10-CM | POA: Diagnosis present

## 2022-02-25 DIAGNOSIS — I10 Essential (primary) hypertension: Secondary | ICD-10-CM | POA: Diagnosis present

## 2022-02-25 DIAGNOSIS — D638 Anemia in other chronic diseases classified elsewhere: Secondary | ICD-10-CM | POA: Diagnosis present

## 2022-02-25 DIAGNOSIS — R531 Weakness: Principal | ICD-10-CM

## 2022-02-25 DIAGNOSIS — A048 Other specified bacterial intestinal infections: Secondary | ICD-10-CM | POA: Diagnosis present

## 2022-02-25 DIAGNOSIS — R197 Diarrhea, unspecified: Secondary | ICD-10-CM

## 2022-02-25 DIAGNOSIS — D84821 Immunodeficiency due to drugs: Secondary | ICD-10-CM | POA: Diagnosis present

## 2022-02-25 DIAGNOSIS — Z79891 Long term (current) use of opiate analgesic: Secondary | ICD-10-CM

## 2022-02-25 DIAGNOSIS — Z7982 Long term (current) use of aspirin: Secondary | ICD-10-CM

## 2022-02-25 DIAGNOSIS — A419 Sepsis, unspecified organism: Secondary | ICD-10-CM | POA: Diagnosis not present

## 2022-02-25 DIAGNOSIS — Z823 Family history of stroke: Secondary | ICD-10-CM

## 2022-02-25 DIAGNOSIS — D630 Anemia in neoplastic disease: Secondary | ICD-10-CM | POA: Diagnosis present

## 2022-02-25 DIAGNOSIS — D701 Agranulocytosis secondary to cancer chemotherapy: Secondary | ICD-10-CM | POA: Diagnosis present

## 2022-02-25 DIAGNOSIS — T451X5A Adverse effect of antineoplastic and immunosuppressive drugs, initial encounter: Secondary | ICD-10-CM | POA: Diagnosis present

## 2022-02-25 DIAGNOSIS — C9001 Multiple myeloma in remission: Secondary | ICD-10-CM | POA: Diagnosis present

## 2022-02-25 DIAGNOSIS — D6959 Other secondary thrombocytopenia: Secondary | ICD-10-CM | POA: Diagnosis present

## 2022-02-25 DIAGNOSIS — Z886 Allergy status to analgesic agent status: Secondary | ICD-10-CM

## 2022-02-25 DIAGNOSIS — E785 Hyperlipidemia, unspecified: Secondary | ICD-10-CM | POA: Diagnosis present

## 2022-02-25 DIAGNOSIS — D702 Other drug-induced agranulocytosis: Secondary | ICD-10-CM

## 2022-02-25 DIAGNOSIS — K529 Noninfective gastroenteritis and colitis, unspecified: Secondary | ICD-10-CM

## 2022-02-25 DIAGNOSIS — Z8051 Family history of malignant neoplasm of kidney: Secondary | ICD-10-CM

## 2022-02-25 DIAGNOSIS — J910 Malignant pleural effusion: Secondary | ICD-10-CM | POA: Diagnosis present

## 2022-02-25 DIAGNOSIS — Z85828 Personal history of other malignant neoplasm of skin: Secondary | ICD-10-CM

## 2022-02-25 DIAGNOSIS — D61818 Other pancytopenia: Secondary | ICD-10-CM | POA: Diagnosis present

## 2022-02-25 DIAGNOSIS — R112 Nausea with vomiting, unspecified: Secondary | ICD-10-CM

## 2022-02-25 DIAGNOSIS — Z9079 Acquired absence of other genital organ(s): Secondary | ICD-10-CM

## 2022-02-25 DIAGNOSIS — D849 Immunodeficiency, unspecified: Secondary | ICD-10-CM

## 2022-02-25 DIAGNOSIS — R339 Retention of urine, unspecified: Secondary | ICD-10-CM | POA: Diagnosis not present

## 2022-02-25 DIAGNOSIS — E876 Hypokalemia: Secondary | ICD-10-CM | POA: Diagnosis present

## 2022-02-25 DIAGNOSIS — R1011 Right upper quadrant pain: Secondary | ICD-10-CM

## 2022-02-25 DIAGNOSIS — R591 Generalized enlarged lymph nodes: Secondary | ICD-10-CM | POA: Diagnosis present

## 2022-02-25 DIAGNOSIS — Z87442 Personal history of urinary calculi: Secondary | ICD-10-CM

## 2022-02-25 DIAGNOSIS — E43 Unspecified severe protein-calorie malnutrition: Secondary | ICD-10-CM | POA: Insufficient documentation

## 2022-02-25 LAB — CBC
HCT: 33.5 % — ABNORMAL LOW (ref 39.0–52.0)
Hemoglobin: 11 g/dL — ABNORMAL LOW (ref 13.0–17.0)
MCH: 32.4 pg (ref 26.0–34.0)
MCHC: 32.8 g/dL (ref 30.0–36.0)
MCV: 98.8 fL (ref 80.0–100.0)
Platelets: 217 10*3/uL (ref 150–400)
RBC: 3.39 MIL/uL — ABNORMAL LOW (ref 4.22–5.81)
RDW: 11.6 % (ref 11.5–15.5)
WBC: 1.7 10*3/uL — ABNORMAL LOW (ref 4.0–10.5)
nRBC: 0 % (ref 0.0–0.2)

## 2022-02-25 LAB — COMPREHENSIVE METABOLIC PANEL
ALT: 17 U/L (ref 0–44)
AST: 29 U/L (ref 15–41)
Albumin: 3.5 g/dL (ref 3.5–5.0)
Alkaline Phosphatase: 42 U/L (ref 38–126)
Anion gap: 9 (ref 5–15)
BUN: 25 mg/dL — ABNORMAL HIGH (ref 8–23)
CO2: 23 mmol/L (ref 22–32)
Calcium: 8.7 mg/dL — ABNORMAL LOW (ref 8.9–10.3)
Chloride: 103 mmol/L (ref 98–111)
Creatinine, Ser: 1.06 mg/dL (ref 0.61–1.24)
GFR, Estimated: >60 mL/min (ref >60)
Glucose, Bld: 113 mg/dL — ABNORMAL HIGH (ref 70–99)
Potassium: 4.3 mmol/L (ref 3.5–5.1)
Sodium: 135 mmol/L (ref 135–145)
Total Bilirubin: 1 mg/dL (ref 0.3–1.2)
Total Protein: 7.3 g/dL (ref 6.5–8.1)

## 2022-02-25 LAB — LIPASE, BLOOD: Lipase: 27 U/L (ref 11–51)

## 2022-02-25 MED ORDER — LACTATED RINGERS IV BOLUS (SEPSIS)
1000.0000 mL | Freq: Once | INTRAVENOUS | Status: AC
  Administered 2022-02-26: 1000 mL via INTRAVENOUS

## 2022-02-25 MED ORDER — ONDANSETRON HCL 4 MG/2ML IJ SOLN
4.0000 mg | INTRAMUSCULAR | Status: AC
  Administered 2022-02-26: 4 mg via INTRAVENOUS
  Filled 2022-02-25: qty 2

## 2022-02-25 MED ORDER — ONDANSETRON 4 MG PO TBDP
4.0000 mg | ORAL_TABLET | Freq: Once | ORAL | Status: AC | PRN
  Administered 2022-02-25: 4 mg via ORAL
  Filled 2022-02-25: qty 1

## 2022-02-25 NOTE — ED Triage Notes (Signed)
Pt presents via POV with complaints of abdominal pain with associated nausea, vomiting, and diarrhea for the last 3 days. Pt has lung cancer & receives chemo every 3 weeks - last dose being last week. Denies fevers, urinary sx, CP or SOB.

## 2022-02-25 NOTE — ED Provider Notes (Signed)
[facility] Provider Note    Event Date/Time   First MD Initiated Contact with Patient 02/25/22 2314     (approximate)   History   Nausea   HPI {Remember to add pertinent medical, surgical, social, and/or OB history to HPI:1} [name] is a 76 y.o. male  ***       Physical Exam   Triage Vital Signs: ED Triage Vitals  Enc Vitals Group     BP 02/25/22 1917 125/87     Pulse Rate 02/25/22 1917 96     Resp 02/25/22 1917 16     Temp 02/25/22 1917 98.2 F (36.8 C)     Temp Source 02/25/22 1917 Oral     SpO2 02/25/22 1917 98 %     Weight 02/25/22 1930 72.6 kg (160 lb)     Height 02/25/22 1930 1.88 m (6\' 2" )     Head Circumference --      Peak Flow --      Pain Score 02/25/22 1930 8     Pain Loc --      Pain Edu? --      Excl. in [place]? --     Most recent vital signs: Vitals:   02/25/22 1917  BP: 125/87  Pulse: 96  Resp: 16  Temp: 98.2 F (36.8 C)  SpO2: 98%    {Only need to document appropriate and relevant physical exam:1} General: Awake, no distress. *** CV:  Good peripheral perfusion. *** Resp:  Normal effort. *** Abd:  No distention. *** Other:  ***   ED Results / Procedures / Treatments   Labs (all labs ordered are listed, but only abnormal results are displayed) Labs Reviewed  COMPREHENSIVE METABOLIC PANEL - Abnormal; Notable for the following components:      Result Value   Glucose, Bld 113 (*)    BUN 25 (*)    Calcium 8.7 (*)    All other components within normal limits  CBC - Abnormal; Notable for the following components:   WBC 1.7 (*)    RBC 3.39 (*)    Hemoglobin 11.0 (*)    HCT 33.5 (*)    All other components within normal limits  LIPASE, BLOOD  URINALYSIS, ROUTINE W REFLEX MICROSCOPIC  CBC WITH DIFFERENTIAL/PLATELET     EKG  ***   RADIOLOGY *** {USE THE WORD "INTERPRETED"!! You MUST document your own interpretation of imaging, as well as the fact that you reviewed the radiologist's  report!:1}   PROCEDURES:  Critical Care performed: {CriticalCareYesNo:19197::"Yes, see critical care procedure note(s)","No"}  Procedures   MEDICATIONS ORDERED IN ED: Medications  lactated ringers bolus 1,000 mL (has no administration in time range)  ondansetron (ZOFRAN) injection 4 mg (has no administration in time range)  ondansetron (ZOFRAN-ODT) disintegrating tablet 4 mg (4 mg Oral Given 02/25/22 1939)     IMPRESSION / MDM / ASSESSMENT AND PLAN / ED COURSE  I reviewed the triage vital signs and the nursing notes.                              Differential diagnosis includes, but is not limited to, ***  Patient's presentation is most consistent with {EM COPA:27473}  {If the patient is on the monitor, remove the brackets and asterisks on the sentence below and remember to document it as a Procedure as well. Otherwise delete the sentence below:1} {**The patient is on the cardiac monitor to evaluate  for evidence of arrhythmia and/or significant heart rate changes.**} {Remember to include, when applicable, any/all of the following data: independent review of imaging independent review of labs (comment specifically on pertinent positives and negatives) review of specific prior hospitalizations, PCP/specialist notes, etc. discuss meds given and prescribed document any discussion with consultants (including hospitalists) any clinical decision tools you used and why (PECARN, NEXUS, etc.) did you consider admitting the patient? document social determinants of health affecting patient's care (homelessness, inability to follow up in a timely fashion, etc) document any pre-existing conditions increasing risk on current visit (e.g. diabetes and HTN increasing danger of high-risk chest pain/ACS) describes what meds you gave (especially parenteral) and why any other interventions?:1}     FINAL CLINICAL IMPRESSION(S) / ED DIAGNOSES   Final diagnoses:  None     Rx / DC Orders   ED  Discharge Orders     None        Note:  This document was prepared using Dragon voice recognition software and may include unintentional dictation errors.

## 2022-02-26 ENCOUNTER — Telehealth: Payer: Self-pay | Admitting: Medical Oncology

## 2022-02-26 ENCOUNTER — Other Ambulatory Visit: Payer: Self-pay

## 2022-02-26 ENCOUNTER — Telehealth: Payer: Self-pay | Admitting: Internal Medicine

## 2022-02-26 ENCOUNTER — Other Ambulatory Visit: Payer: Medicare Other

## 2022-02-26 ENCOUNTER — Other Ambulatory Visit: Payer: Self-pay | Admitting: Internal Medicine

## 2022-02-26 ENCOUNTER — Inpatient Hospital Stay: Payer: Medicare Other

## 2022-02-26 DIAGNOSIS — C3492 Malignant neoplasm of unspecified part of left bronchus or lung: Secondary | ICD-10-CM | POA: Diagnosis present

## 2022-02-26 DIAGNOSIS — C9001 Multiple myeloma in remission: Secondary | ICD-10-CM | POA: Diagnosis present

## 2022-02-26 DIAGNOSIS — Z85828 Personal history of other malignant neoplasm of skin: Secondary | ICD-10-CM | POA: Diagnosis not present

## 2022-02-26 DIAGNOSIS — J910 Malignant pleural effusion: Secondary | ICD-10-CM | POA: Diagnosis present

## 2022-02-26 DIAGNOSIS — E876 Hypokalemia: Secondary | ICD-10-CM | POA: Diagnosis present

## 2022-02-26 DIAGNOSIS — R531 Weakness: Secondary | ICD-10-CM | POA: Diagnosis not present

## 2022-02-26 DIAGNOSIS — I10 Essential (primary) hypertension: Secondary | ICD-10-CM | POA: Diagnosis present

## 2022-02-26 DIAGNOSIS — Z8546 Personal history of malignant neoplasm of prostate: Secondary | ICD-10-CM | POA: Diagnosis not present

## 2022-02-26 DIAGNOSIS — Z95828 Presence of other vascular implants and grafts: Secondary | ICD-10-CM | POA: Diagnosis not present

## 2022-02-26 DIAGNOSIS — Z9079 Acquired absence of other genital organ(s): Secondary | ICD-10-CM | POA: Diagnosis not present

## 2022-02-26 DIAGNOSIS — D84821 Immunodeficiency due to drugs: Secondary | ICD-10-CM | POA: Diagnosis present

## 2022-02-26 DIAGNOSIS — R197 Diarrhea, unspecified: Secondary | ICD-10-CM

## 2022-02-26 DIAGNOSIS — K529 Noninfective gastroenteritis and colitis, unspecified: Secondary | ICD-10-CM | POA: Diagnosis not present

## 2022-02-26 DIAGNOSIS — D701 Agranulocytosis secondary to cancer chemotherapy: Secondary | ICD-10-CM | POA: Diagnosis present

## 2022-02-26 DIAGNOSIS — Z79891 Long term (current) use of opiate analgesic: Secondary | ICD-10-CM

## 2022-02-26 DIAGNOSIS — E872 Acidosis, unspecified: Secondary | ICD-10-CM | POA: Diagnosis present

## 2022-02-26 DIAGNOSIS — Z859 Personal history of malignant neoplasm, unspecified: Secondary | ICD-10-CM

## 2022-02-26 DIAGNOSIS — T451X5A Adverse effect of antineoplastic and immunosuppressive drugs, initial encounter: Secondary | ICD-10-CM | POA: Diagnosis present

## 2022-02-26 DIAGNOSIS — D61818 Other pancytopenia: Secondary | ICD-10-CM | POA: Diagnosis not present

## 2022-02-26 DIAGNOSIS — Z85820 Personal history of malignant melanoma of skin: Secondary | ICD-10-CM | POA: Diagnosis not present

## 2022-02-26 DIAGNOSIS — E785 Hyperlipidemia, unspecified: Secondary | ICD-10-CM | POA: Diagnosis present

## 2022-02-26 DIAGNOSIS — Z8051 Family history of malignant neoplasm of kidney: Secondary | ICD-10-CM | POA: Diagnosis not present

## 2022-02-26 DIAGNOSIS — R1011 Right upper quadrant pain: Secondary | ICD-10-CM

## 2022-02-26 DIAGNOSIS — R6510 Systemic inflammatory response syndrome (SIRS) of non-infectious origin without acute organ dysfunction: Secondary | ICD-10-CM | POA: Diagnosis not present

## 2022-02-26 DIAGNOSIS — Z823 Family history of stroke: Secondary | ICD-10-CM | POA: Diagnosis not present

## 2022-02-26 DIAGNOSIS — R112 Nausea with vomiting, unspecified: Secondary | ICD-10-CM

## 2022-02-26 DIAGNOSIS — E43 Unspecified severe protein-calorie malnutrition: Secondary | ICD-10-CM | POA: Diagnosis present

## 2022-02-26 DIAGNOSIS — D630 Anemia in neoplastic disease: Secondary | ICD-10-CM | POA: Diagnosis present

## 2022-02-26 DIAGNOSIS — D6959 Other secondary thrombocytopenia: Secondary | ICD-10-CM | POA: Diagnosis present

## 2022-02-26 DIAGNOSIS — A419 Sepsis, unspecified organism: Secondary | ICD-10-CM | POA: Diagnosis present

## 2022-02-26 DIAGNOSIS — D702 Other drug-induced agranulocytosis: Secondary | ICD-10-CM

## 2022-02-26 DIAGNOSIS — A048 Other specified bacterial intestinal infections: Secondary | ICD-10-CM | POA: Diagnosis present

## 2022-02-26 LAB — URINALYSIS, COMPLETE (UACMP) WITH MICROSCOPIC
Bilirubin Urine: NEGATIVE
Glucose, UA: NEGATIVE mg/dL
Ketones, ur: 5 mg/dL — AB
Leukocytes,Ua: NEGATIVE
Nitrite: NEGATIVE
Protein, ur: NEGATIVE mg/dL
Specific Gravity, Urine: 1.02 (ref 1.005–1.030)
Squamous Epithelial / HPF: NONE SEEN (ref 0–5)
pH: 7 (ref 5.0–8.0)

## 2022-02-26 LAB — BASIC METABOLIC PANEL
Anion gap: 9 (ref 5–15)
BUN: 26 mg/dL — ABNORMAL HIGH (ref 8–23)
CO2: 24 mmol/L (ref 22–32)
Calcium: 8.6 mg/dL — ABNORMAL LOW (ref 8.9–10.3)
Chloride: 104 mmol/L (ref 98–111)
Creatinine, Ser: 1.07 mg/dL (ref 0.61–1.24)
GFR, Estimated: >60 mL/min (ref >60)
Glucose, Bld: 117 mg/dL — ABNORMAL HIGH (ref 70–99)
Potassium: 4.3 mmol/L (ref 3.5–5.1)
Sodium: 137 mmol/L (ref 135–145)

## 2022-02-26 LAB — CBC WITH DIFFERENTIAL/PLATELET
Abs Immature Granulocytes: 0.01 10*3/uL (ref 0.00–0.07)
Abs Immature Granulocytes: 0.01 10*3/uL (ref 0.00–0.07)
Basophils Absolute: 0 10*3/uL (ref 0.0–0.1)
Basophils Absolute: 0 10*3/uL (ref 0.0–0.1)
Basophils Relative: 1 %
Basophils Relative: 1 %
Eosinophils Absolute: 0 10*3/uL (ref 0.0–0.5)
Eosinophils Absolute: 0 10*3/uL (ref 0.0–0.5)
Eosinophils Relative: 0 %
Eosinophils Relative: 1 %
HCT: 33.4 % — ABNORMAL LOW (ref 39.0–52.0)
HCT: 26.2 % — ABNORMAL LOW (ref 39.0–52.0)
Hemoglobin: 11.1 g/dL — ABNORMAL LOW (ref 13.0–17.0)
Hemoglobin: 9.1 g/dL — ABNORMAL LOW (ref 13.0–17.0)
Immature Granulocytes: 1 %
Immature Granulocytes: 1 %
Lymphocytes Relative: 35 %
Lymphocytes Relative: 32 %
Lymphs Abs: 0.5 10*3/uL — ABNORMAL LOW (ref 0.7–4.0)
Lymphs Abs: 0.4 10*3/uL — ABNORMAL LOW (ref 0.7–4.0)
MCH: 32.9 pg (ref 26.0–34.0)
MCH: 32.7 pg (ref 26.0–34.0)
MCHC: 33.2 g/dL (ref 30.0–36.0)
MCHC: 34.7 g/dL (ref 30.0–36.0)
MCV: 99.1 fL (ref 80.0–100.0)
MCV: 94.2 fL (ref 80.0–100.0)
Monocytes Absolute: 0.1 10*3/uL (ref 0.1–1.0)
Monocytes Absolute: 0.1 10*3/uL (ref 0.1–1.0)
Monocytes Relative: 3 %
Monocytes Relative: 4 %
Neutro Abs: 0.9 10*3/uL — ABNORMAL LOW (ref 1.7–7.7)
Neutro Abs: 0.9 10*3/uL — ABNORMAL LOW (ref 1.7–7.7)
Neutrophils Relative %: 60 %
Neutrophils Relative %: 61 %
Platelets: 231 10*3/uL (ref 150–400)
Platelets: 193 10*3/uL (ref 150–400)
RBC: 3.37 MIL/uL — ABNORMAL LOW (ref 4.22–5.81)
RBC: 2.78 MIL/uL — ABNORMAL LOW (ref 4.22–5.81)
RDW: 11.7 % (ref 11.5–15.5)
RDW: 11.5 % (ref 11.5–15.5)
Smear Review: NORMAL
Smear Review: NORMAL
WBC: 1.5 10*3/uL — ABNORMAL LOW (ref 4.0–10.5)
WBC: 1.4 10*3/uL — CL (ref 4.0–10.5)
nRBC: 0 % (ref 0.0–0.2)
nRBC: 0 % (ref 0.0–0.2)

## 2022-02-26 LAB — CBC
HCT: 25.8 % — ABNORMAL LOW (ref 39.0–52.0)
Hemoglobin: 9 g/dL — ABNORMAL LOW (ref 13.0–17.0)
MCH: 32.7 pg (ref 26.0–34.0)
MCHC: 34.9 g/dL (ref 30.0–36.0)
MCV: 93.8 fL (ref 80.0–100.0)
Platelets: 169 10*3/uL (ref 150–400)
RBC: 2.75 MIL/uL — ABNORMAL LOW (ref 4.22–5.81)
RDW: 11.4 % — ABNORMAL LOW (ref 11.5–15.5)
WBC: 1.4 10*3/uL — CL (ref 4.0–10.5)
nRBC: 0 % (ref 0.0–0.2)

## 2022-02-26 LAB — LACTIC ACID, PLASMA
Lactic Acid, Venous: 2.4 mmol/L (ref 0.5–1.9)
Lactic Acid, Venous: 1.7 mmol/L (ref 0.5–1.9)

## 2022-02-26 LAB — PATHOLOGIST SMEAR REVIEW

## 2022-02-26 MED ORDER — SODIUM CHLORIDE 0.9 % IV SOLN
2.0000 g | Freq: Three times a day (TID) | INTRAVENOUS | Status: DC
  Administered 2022-02-26 – 2022-02-28 (×7): 2 g via INTRAVENOUS
  Filled 2022-02-26: qty 12.5
  Filled 2022-02-26 (×2): qty 2
  Filled 2022-02-26 (×6): qty 12.5

## 2022-02-26 MED ORDER — MIRTAZAPINE 15 MG PO TABS
15.0000 mg | ORAL_TABLET | Freq: Every day | ORAL | Status: DC
Start: 2022-02-26 — End: 2022-02-28
  Administered 2022-02-26 – 2022-02-27 (×2): 15 mg via ORAL
  Filled 2022-02-26 (×2): qty 1

## 2022-02-26 MED ORDER — LACTATED RINGERS IV BOLUS (SEPSIS)
1000.0000 mL | Freq: Once | INTRAVENOUS | Status: AC
  Administered 2022-02-26: 1000 mL via INTRAVENOUS

## 2022-02-26 MED ORDER — SODIUM CHLORIDE 0.9 % IV SOLN
2.0000 g | Freq: Once | INTRAVENOUS | Status: AC
  Administered 2022-02-26: 2 g via INTRAVENOUS
  Filled 2022-02-26: qty 12.5

## 2022-02-26 MED ORDER — PROSOURCE PLUS PO LIQD
30.0000 mL | Freq: Two times a day (BID) | ORAL | Status: DC
  Administered 2022-02-27: 30 mL via ORAL
  Filled 2022-02-26 (×2): qty 30

## 2022-02-26 MED ORDER — CHLORHEXIDINE GLUCONATE CLOTH 2 % EX PADS
6.0000 | MEDICATED_PAD | Freq: Every day | CUTANEOUS | Status: DC
  Administered 2022-02-26 – 2022-02-28 (×3): 6 via TOPICAL

## 2022-02-26 MED ORDER — METRONIDAZOLE 500 MG/100ML IV SOLN
500.0000 mg | Freq: Two times a day (BID) | INTRAVENOUS | Status: DC
  Administered 2022-02-26 – 2022-02-28 (×5): 500 mg via INTRAVENOUS
  Filled 2022-02-26 (×5): qty 100

## 2022-02-26 MED ORDER — ACETAMINOPHEN 650 MG RE SUPP: 650.0000 mg | Freq: Four times a day (QID) | RECTAL | Status: DC | PRN

## 2022-02-26 MED ORDER — ONDANSETRON HCL 4 MG PO TABS: 4.0000 mg | ORAL_TABLET | Freq: Four times a day (QID) | ORAL | Status: DC | PRN

## 2022-02-26 MED ORDER — BOOST / RESOURCE BREEZE PO LIQD CUSTOM
1.0000 | Freq: Three times a day (TID) | ORAL | Status: DC
  Administered 2022-02-26 – 2022-02-27 (×2): 1 via ORAL

## 2022-02-26 MED ORDER — LACTATED RINGERS IV SOLN: INTRAVENOUS | Status: AC

## 2022-02-26 MED ORDER — TAMSULOSIN HCL 0.4 MG PO CAPS
0.4000 mg | ORAL_CAPSULE | Freq: Every day | ORAL | Status: DC
  Administered 2022-02-26 – 2022-02-28 (×3): 0.4 mg via ORAL
  Filled 2022-02-26 (×3): qty 1

## 2022-02-26 MED ORDER — ONDANSETRON HCL 4 MG/2ML IJ SOLN
4.0000 mg | Freq: Four times a day (QID) | INTRAMUSCULAR | Status: DC | PRN
  Administered 2022-02-26 (×2): 4 mg via INTRAVENOUS
  Filled 2022-02-26 (×2): qty 2

## 2022-02-26 MED ORDER — FENTANYL 75 MCG/HR TD PT72
1.0000 | MEDICATED_PATCH | TRANSDERMAL | Status: DC
  Filled 2022-02-26: qty 1

## 2022-02-26 MED ORDER — ENOXAPARIN SODIUM 40 MG/0.4ML IJ SOSY
40.0000 mg | PREFILLED_SYRINGE | INTRAMUSCULAR | Status: DC
  Administered 2022-02-26 – 2022-02-28 (×3): 40 mg via SUBCUTANEOUS
  Filled 2022-02-26 (×3): qty 0.4

## 2022-02-26 MED ORDER — TBO-FILGRASTIM 480 MCG/0.8ML ~~LOC~~ SOSY
480.0000 ug | PREFILLED_SYRINGE | Freq: Every day | SUBCUTANEOUS | Status: DC
  Administered 2022-02-26 – 2022-02-28 (×3): 480 ug via SUBCUTANEOUS
  Filled 2022-02-26 (×3): qty 0.8

## 2022-02-26 MED ORDER — ASPIRIN EC 81 MG PO TBEC
81.0000 mg | DELAYED_RELEASE_TABLET | Freq: Every day | ORAL | Status: DC
  Administered 2022-02-26 – 2022-02-28 (×3): 81 mg via ORAL
  Filled 2022-02-26 (×3): qty 1

## 2022-02-26 MED ORDER — LACTATED RINGERS IV BOLUS (SEPSIS)
250.0000 mL | Freq: Once | INTRAVENOUS | Status: AC
  Administered 2022-02-26: 250 mL via INTRAVENOUS

## 2022-02-26 MED ORDER — ACETAMINOPHEN 325 MG PO TABS: 650.0000 mg | ORAL_TABLET | Freq: Four times a day (QID) | ORAL | Status: DC | PRN

## 2022-02-26 MED ORDER — ADULT MULTIVITAMIN W/MINERALS CH
1.0000 | ORAL_TABLET | Freq: Every day | ORAL | Status: DC
  Administered 2022-02-26 – 2022-02-28 (×3): 1 via ORAL
  Filled 2022-02-26 (×3): qty 1

## 2022-02-26 MED ORDER — PANTOPRAZOLE SODIUM 40 MG PO TBEC
40.0000 mg | DELAYED_RELEASE_TABLET | Freq: Every day | ORAL | Status: DC
Start: 2022-02-26 — End: 2022-02-28
  Administered 2022-02-26 – 2022-02-28 (×3): 40 mg via ORAL
  Filled 2022-02-26 (×3): qty 1

## 2022-02-26 MED ORDER — MORPHINE SULFATE (PF) 2 MG/ML IV SOLN
2.0000 mg | INTRAVENOUS | Status: AC | PRN
  Administered 2022-02-26 (×2): 2 mg via INTRAVENOUS
  Filled 2022-02-26 (×2): qty 1

## 2022-02-26 MED ORDER — OXYCODONE HCL 5 MG PO TABS: 10.0000 mg | ORAL_TABLET | Freq: Three times a day (TID) | ORAL | Status: DC | PRN

## 2022-02-26 NOTE — Progress Notes (Signed)
CODE SEPSIS - PHARMACY COMMUNICATION  **Broad Spectrum Antibiotics should be administered within 1 hour of Sepsis diagnosis**  Time Code Sepsis Called/Page Received:  8/3 @ 0157  Antibiotics Ordered:  cefepime 2 gm IV   Time of 1st antibiotic administration: Cefepime 2 gm IV X 1 on 8/3 @ 0234  Additional action taken by pharmacy:   If necessary, Name of Provider/Nurse Contacted:     Kellen Dutch D ,PharmD Clinical Pharmacist  02/26/2022  3:13 AM

## 2022-02-26 NOTE — Progress Notes (Signed)
Pt with UA ordered since arrival to ED. Pt states he is unable to void at this time, does not feel the need to void. Per previous nursing note, "DO NOT IN/OUT CATH for urine specimen" per pt's primary oncologist. Bladder scan performed at this time with 48mL in bladder.

## 2022-02-26 NOTE — Assessment & Plan Note (Addendum)
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

## 2022-02-26 NOTE — Assessment & Plan Note (Signed)
Last chemotherapy was 7/27 Followed by Dr. Earlie Server

## 2022-02-26 NOTE — ED Notes (Signed)
Per Oncology, DO NOT IN/OUT CATH patient for urine specimen.

## 2022-02-26 NOTE — Assessment & Plan Note (Signed)
On fentanyl

## 2022-02-26 NOTE — Progress Notes (Signed)
Pt not able to void, having difficulty starting stream, pt states he doesn't feel the urge to urinate. Bladder scanned pt yield > 376 cc. Per Dr. Wynelle Cleveland insert foley catheter.   1400-unable to insert foley catheter, meeting resistance, Dr. Wynelle Cleveland notified, per MD will order coude catheter.

## 2022-02-26 NOTE — Assessment & Plan Note (Signed)
Hemoglobin at baseline at 70.1 Continue folic acid

## 2022-02-26 NOTE — Sepsis Progress Note (Addendum)
Monitoring for the code sepsis protocol.  Blood cultures collected at midnight, code sepsis ordered at [place].

## 2022-02-26 NOTE — Progress Notes (Signed)
Dr. Damita Dunnings notified of critical WBC result of 1.4. Protective isolation in place per order.

## 2022-02-26 NOTE — Progress Notes (Signed)
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 This patient presented to the hospital on 8/2 with complaints of abdominal pain nausea vomiting and diarrhea for 2 to 3 days.  The patient states that he has had at least 3-4 episodes of diarrhea daily and vomiting daily. In the ED: WBC count 1.7, creatinine 1.06, lactic acid 2.4, heart rate in the 90s.  Case was discussed with oncology Dr. Rogue Bussing who recommended the patient be placed on antibiotics.  The patient was started on Maxipime and metronidazole. Stool studies were ordered. Subjective:  He has had 1 episode of loose stool this morning mixed with urine.  He does not have any nausea but has no appetite and did not want his clear liquid breakfast.  No abdominal pain.  Assessment and Plan: Principal Problem:   Nausea/ vomiting abdominal pain and diarrhea -CT scan of the abdomen pelvis without contrast was unrevealing as to the etiology - ? 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 - Receiving carboplatin, Altima and Keytruda    Poor appetite - Recently  received a Medrol Dosepak by oncology    SIRS , probable sepsis (systemic inflammatory response syndrome) ([place]) - f/u work up to identify an infection     Essential hypertension, benign - not on meds for this follow    History of prostate cancer s/p prostatectomy/history of multiple myeloma in remission - Nubeqa last filled in March- will resume if he is still taking it    Chronic prescription opiate use - resume oxycodone 10-10 mg TID PRN - cont 75 mcg Fentanyl pacth   DVT prophylaxis:  enoxaparin (LOVENOX) injection 40 mg Start: 02/26/22 0800     Code Status: Full Code  Consultants: oncology Level of Care: Level of care: Telemetry Medical Disposition Plan:  Status is: Inpatient Remains inpatient appropriate because: f/u GI work up  Objective:   Vitals:   02/26/22 0308 02/26/22 0341 02/26/22 0621 02/26/22 0736  BP: 133/79 137/74 (!) 155/90 137/78  Pulse: 78 80 79 64  Resp: '18 18 20 18  ' Temp: 98.1 F (36.7 C) 99.7 F (37.6 C) 99.3 F (37.4 C) 98.6 F (37 C)  TempSrc: Oral Oral Oral   SpO2: 98% 100% 100% 100%  Weight:      Height:       Filed Weights   02/25/22 1930  Weight: 72.6 kg   Exam: General exam: Appears comfortable  HEENT: PERRLA, oral mucosa moist, no sclera icterus or thrush Respiratory system: Clear to auscultation. Respiratory effort normal. Cardiovascular system: S1 & S2 heard, regular rate and rhythm Gastrointestinal system: Abdomen soft, non-tender, nondistended. Normal  bowel sounds   Central nervous system: Alert and oriented. No focal neurological deficits. Extremities: No cyanosis, clubbing or edema Skin: No rashes or ulcers Psychiatry:  Mood & affect appropriate.    Imaging and lab data was personally reviewed    CBC: Recent Labs  Lab 02/25/22 1932 02/26/22 0405  WBC 1.5*  1.7* 1.4*  NEUTROABS 0.9*  --   HGB 11.1*  11.0* 9.0*  HCT 33.4*  33.5* 25.8*  MCV 99.1  98.8 93.8  PLT 231  217 779   Basic Metabolic Panel: Recent  Labs  Lab 02/25/22 1932 02/26/22 0405  NA 135 137  K 4.3 4.3  CL 103 104  CO2 23 24  GLUCOSE 113* 117*  BUN 25* 26*  CREATININE 1.06 1.07  CALCIUM 8.7* 8.6*   GFR: Estimated Creatinine Clearance: 60.3 mL/min (by C-G formula based on SCr of 1.07 mg/dL).  Scheduled Meds:  aspirin EC  81 mg Oral Daily   enoxaparin (LOVENOX) injection  40 mg Subcutaneous Q24H   fentaNYL  1 patch Transdermal Q72H   mirtazapine  15 mg Oral QHS   pantoprazole  40 mg Oral Daily   tamsulosin  0.4 mg Oral QPC supper   Tbo-filgastrim (GRANIX) SQ  480 mcg Subcutaneous Daily   Continuous Infusions:  ceFEPime (MAXIPIME) IV 2 g (02/26/22 3968)   metronidazole 500 mg (02/26/22 0409)     LOS: 0 days   Author: Debbe Odea  02/26/2022 10:50 AM

## 2022-02-26 NOTE — Telephone Encounter (Signed)
02/25/22 No food intake in 5 days -vomiting, weak .  02/26/22-pt admitted to hospital 02/25/22.

## 2022-02-26 NOTE — Progress Notes (Signed)
Initial Nutrition Assessment  DOCUMENTATION CODES:   Severe malnutrition in context of chronic illness  INTERVENTION:   -Boost Breeze po TID, each supplement provides 250 kcal and 9 grams of protein  -30 ml Prosource Plus TID, each supplement provides 100 kcals and 15 grams protein -MVI with minerals daily -RD will follow for diet advancement and add supplements as appropriate  NUTRITION DIAGNOSIS:   Severe Malnutrition related to chronic illness (lung cancer) as evidenced by percent weight loss, severe fat depletion, severe muscle depletion.  GOAL:   Patient will meet greater than or equal to 90% of their needs  MONITOR:   PO intake, Supplement acceptance, Diet advancement  REASON FOR ASSESSMENT:   Malnutrition Screening Tool    ASSESSMENT:   Pt with medical history significant for HTN, prostate cancer s/p prostatectomy 2003, multiple myeloma in remission and more recently stage IV non-small cell lung cancer on palliative chemotherapy and immunotherapy, with last oncology visit and cycle 2 infusion therapy 02/19/2022 who presents with a 3-day history of nausea vomiting and diarrhea as well as severe right upper quadrant colicky abdominal pain.  Pt admitted with SIRS, acute gastroenteritis, and immunosuppression secondary to chemotherapy.    Reviewed I/O's: +1.1 L x 24 hours  Spoke with pt at bedside, who reports he has been unable to keep foods and liquids down for the past 5 days. Per pt, he reports appetite was good prior to acute illness, but unable to give diet recall. Pt was sipping water at time of visit and started vomiting during RD assessment.   Reviewed wt hx; pt has experienced a 10.9% wt loss over the past month, which is significant for time frame.   Medications reviewed and include remeron.   Labs reviewed: Phos: 7.2, CBGS: 104 (inpatient orders for glycemic control are none).    NUTRITION - FOCUSED PHYSICAL EXAM:  Flowsheet Row Most Recent Value   Orbital Region Severe depletion  Upper Arm Region Severe depletion  Thoracic and Lumbar Region Severe depletion  Buccal Region Severe depletion  Temple Region Severe depletion  Clavicle Bone Region Severe depletion  Clavicle and Acromion Bone Region Severe depletion  Scapular Bone Region Severe depletion  Dorsal Hand Severe depletion  Patellar Region Severe depletion  Anterior Thigh Region Severe depletion  Posterior Calf Region Severe depletion  Edema (RD Assessment) None  Hair Reviewed  Eyes Reviewed  Mouth Reviewed  Skin Reviewed  Nails Reviewed       Diet Order:   Diet Order             Diet clear liquid Room service appropriate? Yes; Fluid consistency: Thin  Diet effective now                   EDUCATION NEEDS:   Not appropriate for education at this time  Skin:  Skin Assessment: Reviewed RN Assessment  Last BM:  02/25/22  Height:   Ht Readings from Last 1 Encounters:  02/25/22 '6\' 2"'  (1.88 m)    Weight:   Wt Readings from Last 1 Encounters:  02/25/22 72.6 kg    Ideal Body Weight:  86.4 kg  BMI:  Body mass index is 20.54 kg/m.  Estimated Nutritional Needs:   Kcal:  2200-2400  Protein:  110-125 grams  Fluid:  > 2 L    Loistine Chance, RD, LDN, [place] Registered Dietitian II Certified Diabetes Care and Education Specialist Please refer to [facility] for RD and/or RD on-call/weekend/after hours pager

## 2022-02-26 NOTE — Assessment & Plan Note (Signed)
Likely related to gastroenteritis. lfts and lipase normal Will get CT abd and pelvis to evaluate further

## 2022-02-26 NOTE — Consult Note (Signed)
[place] NOTE  Patient Care Team: Jinny Sanders, MD as PCP - General Jeanann Lewandowsky, MD as Consulting Physician (Hematology and Oncology) Cathlean Marseilles, NP as Nurse Practitioner (Hematology and Oncology) Raynelle Bring, MD as Consulting Physician (Urology) Garrel Ridgel, [place] as Consulting Physician (Podiatry) Morton Stall, Howell Rucks, NP as Nurse Practitioner (Pain Medicine) Druscilla Brownie, MD as Consulting Physician (Dermatology) Bryson Ha, OD as Consulting Physician (Optometry) Nicholas Lose, MD as Consulting Physician (Hematology and Oncology)  CHIEF COMPLAINTS/PURPOSE OF CONSULTATION: Lung cancer/neutropenic sepsis  HISTORY OF PRESENTING ILLNESS:  [name] 76 y.o.  male with history of stage IV non-small cell lung cancer, adenocarcinoma [diagnosed May 2023]; also prior history of multiple myeloma [2012 diagnosed; remission]; history of prostate cancer status post prostatectomy [2003]-i currently on chemoimmunotherapy.  Patient received cycle #2 of carboplatin Alimta-Keytruda approximately 1 week ago.  Patient did not receive any growth factor support.  However given previous neutropenia patient's carboplatin AUC dose was reduced to 4.   Approximately 2 to 3 days postchemotherapy patient noted to have worsening nausea vomiting.  Also noted to have significant diarrhea.  Multiple loose stools a point of incontinence.  Complains of abdominal pain-right upper quadrant.  Complains of poor p.o. intake.  On admission to emergency room-patient was found to be neutropenic; ANC 0.9; hemoglobin 9.1 platelets 193.  Chemistries unremarkable.  Given the neutropenia-patient is currently on broad-spectrum antibiotics.   Patient continues to have nausea with vomiting this morning.  Diarrhea resolved.    Review of Systems  Constitutional:  Positive for malaise/fatigue and weight loss. Negative for chills, diaphoresis and fever.  HENT:  Negative for  nosebleeds and sore throat.   Eyes:  Negative for double vision.  Respiratory:  Positive for cough and shortness of breath. Negative for hemoptysis, sputum production and wheezing.   Cardiovascular:  Negative for chest pain, palpitations, orthopnea and leg swelling.  Gastrointestinal:  Positive for abdominal pain, diarrhea, nausea and vomiting. Negative for blood in stool, constipation, heartburn and melena.  Genitourinary:  Negative for dysuria, frequency and urgency.  Musculoskeletal:  Negative for back pain and joint pain.  Skin: Negative.  Negative for itching and rash.  Neurological:  Positive for dizziness and weakness. Negative for tingling, focal weakness and headaches.  Endo/Heme/Allergies:  Does not bruise/bleed easily.  Psychiatric/Behavioral:  Negative for depression. The patient is not nervous/anxious and does not have insomnia.     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 Leonie Man)   CATARACT EXTRACTION W/ INTRAOCULAR LENS IMPLANT Bilateral  DEXA  05/2011   spine 0.7, L femur 0.0, R femur -0.6, normal   KIDNEY STONE SURGERY     KIDNEY STONE  SURGERY  06/2009   Stone removed from bladder   MELANOMA EXCISION Left 06/05/2019   Procedure: REEXCISION MELANOMA OF LEFT POSTERIOR AURICLE;  Surgeon: Johnathan Hausen, MD;  Location: [place];  Service: General;  Laterality: Left;   PORTACATH PLACEMENT  08/05/11   right sided portacath   PROSTATECTOMY  04/06/07   TOE AMPUTATION Left 2017   2nd toe left foot    SOCIAL HISTORY: Social History   Socioeconomic History   Marital status: Single    Spouse name: Not on file   Number of children: Not on file   Years of education: Not on file   Highest education level: Not on file  Occupational History   Occupation: Sales    Comment: [employer] [place]  Tobacco Use   Smoking status: Former    Packs/day: 2.00    Years: 30.00    Total pack years: 60.00    Types: Cigarettes    Quit date: 07/27/1996    Years since quitting: 25.6   Smokeless tobacco: Never  Substance and Sexual Activity   Alcohol use: No   Drug use: No   Sexual activity: Never  Other Topics Concern   Not on file  Social History Narrative   Not on file   Social Determinants of Health   Financial Resource Strain: Not on file  Food Insecurity: Not on file  Transportation Needs: Not on file  Physical Activity: Not on file  Stress: Not on file  Social Connections: Not on file  Intimate Partner Violence: Not on file    FAMILY HISTORY: Family History  Problem Relation Age of Onset   Pneumonia Mother    Stroke Father    Cancer Brother        lung   Kidney cancer Sister    Colon cancer Neg Hx     ALLERGIES:  is allergic to celebrex [celecoxib], cymbalta [duloxetine hcl], hydromorphone hcl, meloxicam, oxycontin [oxycodone], percocet [oxycodone-acetaminophen], vicodin [hydrocodone-acetaminophen], and lyrica [pregabalin].  MEDICATIONS:  Current Facility-Administered Medications  Medication Dose Route Frequency Provider Last Rate Last Admin   [START ON 02/27/2022] (feeding supplement)  PROSource Plus liquid 30 mL  30 mL Oral BID BM Rizwan, Saima, MD       acetaminophen (TYLENOL) tablet 650 mg  650 mg Oral Q6H PRN Athena Masse, MD       Or   acetaminophen (TYLENOL) suppository 650 mg  650 mg Rectal Q6H PRN Athena Masse, MD       aspirin EC tablet 81 mg  81 mg Oral Daily Rizwan, Eunice Blase, MD   81 mg at 02/26/22 1326   ceFEPIme (MAXIPIME) 2 g in sodium chloride 0.9 % 100 mL IVPB  2 g Intravenous Q8H Judd Gaudier V, MD 200 mL/hr at 02/26/22 1812 2 g at 02/26/22 1812   Chlorhexidine Gluconate Cloth 2 % PADS 6 each  6 each Topical Daily Debbe Odea, MD   6 each at 02/26/22 1326   enoxaparin (LOVENOX) injection 40 mg  40 mg Subcutaneous Q24H Judd Gaudier V, MD   40 mg at 02/26/22 6967   feeding supplement (BOOST / RESOURCE BREEZE) liquid 1 Container  1 Container Oral TID BM Debbe Odea, MD   1 Container at 02/26/22 2056   fentaNYL (DURAGESIC) 75 MCG/HR 1 patch  1 patch Transdermal Q72H Debbe Odea, MD  metroNIDAZOLE (FLAGYL) IVPB 500 mg  500 mg Intravenous Q12H Judd Gaudier V, MD 100 mL/hr at 02/26/22 1536 500 mg at 02/26/22 1536   mirtazapine (REMERON) tablet 15 mg  15 mg Oral QHS Debbe Odea, MD   15 mg at 02/26/22 2046   multivitamin with minerals tablet 1 tablet  1 tablet Oral Daily Debbe Odea, MD   1 tablet at 02/26/22 1636   ondansetron (ZOFRAN) tablet 4 mg  4 mg Oral Q6H PRN Athena Masse, MD       Or   ondansetron [facility]) injection 4 mg  4 mg Intravenous Q6H PRN Athena Masse, MD   4 mg at 02/26/22 1222   oxyCODONE (Oxy IR/ROXICODONE) immediate release tablet 10 mg  10 mg Oral TID PRN Debbe Odea, MD       pantoprazole (PROTONIX) EC tablet 40 mg  40 mg Oral Daily Rizwan, Eunice Blase, MD   40 mg at 02/26/22 1326   tamsulosin (FLOMAX) capsule 0.4 mg  0.4 mg Oral QPC supper Debbe Odea, MD   0.4 mg at 02/26/22 1635   Tbo-Filgrastim (GRANIX) injection 480 mcg  480 mcg Subcutaneous Daily Charlaine Dalton R, MD   480 mcg at 02/26/22 1218    PHYSICAL  EXAMINATION:  Vitals:   02/26/22 1546 02/26/22 2048  BP: 121/75 139/75  Pulse: 72 76  Resp: 18 16  Temp: 98.3 F (36.8 C) 98 F (36.7 C)  SpO2: 100% 97%   Filed Weights   02/25/22 1930  Weight: 160 lb (72.6 kg)   Dry oral mucosa.  Poor dentition; dentures present  Amputation of the toes noted on the left lower extremity.  Physical Exam Vitals and nursing note reviewed.  HENT:     Head: Normocephalic and atraumatic.     Mouth/Throat:     Pharynx: Oropharynx is clear.  Eyes:     Extraocular Movements: Extraocular movements intact.     Pupils: Pupils are equal, round, and reactive to light.  Cardiovascular:     Rate and Rhythm: Normal rate and regular rhythm.  Pulmonary:     Comments: Decreased breath sounds bilaterally.  Abdominal:     Palpations: Abdomen is soft.  Musculoskeletal:        General: Normal range of motion.     Cervical back: Normal range of motion.  Skin:    General: Skin is warm.  Neurological:     General: No focal deficit present.     Mental Status: He is alert and oriented to person, place, and time.  Psychiatric:        Behavior: Behavior normal.        Judgment: Judgment normal.     LABORATORY DATA:  I have reviewed the data as listed Lab Results  Component Value Date   WBC 1.4 (LL) 02/26/2022   WBC 1.4 (LL) 02/26/2022   HGB 9.0 (L) 02/26/2022   HGB 9.1 (L) 02/26/2022   HCT 25.8 (L) 02/26/2022   HCT 26.2 (L) 02/26/2022   MCV 93.8 02/26/2022   MCV 94.2 02/26/2022   PLT 169 02/26/2022   PLT 193 02/26/2022   Recent Labs    02/12/22 1308 02/19/22 0800 02/25/22 1932 02/26/22 0405  NA 134* 136 135 137  K 3.6 3.2* 4.3 4.3  CL 96* 98 103 104  CO2 '29 29 23 24  ' GLUCOSE 120* 107* 113* 117*  BUN 27* 15 25* 26*  CREATININE 1.22 1.10 1.06 1.07  CALCIUM 9.3 8.9 8.7* 8.6*  GFRNONAA >60 >60 >60 >  60  PROT 6.8 6.7 7.3  --   ALBUMIN 3.6 3.4* 3.5  --   AST '20 23 29  ' --   ALT '17 13 17  ' --   ALKPHOS 44 44 42  --   BILITOT 0.6 0.4 1.0  --      RADIOGRAPHIC STUDIES: I have personally reviewed the radiological images as listed and agreed with the findings in the report. CT ABDOMEN PELVIS WO CONTRAST  Result Date: 02/26/2022 CLINICAL DATA:  Nausea, vomiting.  History of prostate cancer. EXAM: CT ABDOMEN AND PELVIS WITHOUT CONTRAST TECHNIQUE: Multidetector CT imaging of the abdomen and pelvis was performed following the standard protocol without IV contrast. RADIATION DOSE REDUCTION: This exam was performed according to the departmental dose-optimization program which includes automated exposure control, adjustment of the mA and/or kV according to patient size and/or use of iterative reconstruction technique. COMPARISON:  January 17, 2020. FINDINGS: Lower chest: Small left pleural effusion is noted with adjacent subsegmental atelectasis of the left lower lobe. Hepatobiliary: No focal liver abnormality is seen. Mild cholelithiasis is noted without biliary dilatation. Pancreas: Unremarkable. No pancreatic ductal dilatation or surrounding inflammatory changes. Spleen: Normal in size without focal abnormality. Adrenals/Urinary Tract: Adrenal glands appear normal. Bilateral nonobstructive nephrolithiasis is noted. No hydronephrosis or renal obstruction is noted. Urinary bladder is unremarkable. Stable left renal cyst is noted for which no further follow-up is required. Stomach/Bowel: Stomach is within normal limits. Appendix appears normal. No evidence of bowel wall thickening, distention, or inflammatory changes. Vascular/Lymphatic: Aortic atherosclerosis. No enlarged abdominal or pelvic lymph nodes. Reproductive: Status post prostatectomy. Other: No abdominal wall hernia or abnormality. No abdominopelvic ascites. Musculoskeletal: Status post kyphoplasty at T12, L2, L3 and L4. Stable old right parasymphyseal fracture is noted. No definite acute osseous abnormality is noted. IMPRESSION: Small left pleural effusion is noted with adjacent subsegmental  atelectasis of the left lower lobe. Mild cholelithiasis without inflammation. Bilateral nonobstructive nephrolithiasis. Status post prostatectomy. Aortic Atherosclerosis (ICD10-I70.0). Electronically Signed   By: Marijo Conception M.D.   On: 02/26/2022 08:51   DG Chest Port 1 View  Result Date: 02/26/2022 CLINICAL DATA:  Questionable sepsis - evaluate for abnormality Nausea, vomiting and diarrhea. History of lung cancer, active chemotherapy. EXAM: PORTABLE CHEST 1 VIEW COMPARISON:  Radiograph 12/18/2021 FINDINGS: Left chest port remains in place. Decreased left pleural effusion with persistent pleuroparenchymal opacity at the left lung base. Mild diffuse increased density in the left hemithorax may represent layering effusion or airspace disease. This is similar to prior. Known left lung mass is not well seen by radiograph. Mild atelectasis at the right lung base. Stable heart size and mediastinal contours. No pneumothorax. IMPRESSION: 1. Decreased left pleural effusion from prior exam. Persistent pleuroparenchymal opacity at the left lung base. 2. Mild diffuse increased density in the left hemithorax may represent layering effusion or airspace disease, unchanged. Electronically Signed   By: Keith Rake M.D.   On: 02/26/2022 00:04     # 76 yo patient with multiple co-morbidities diagnosed stage IV lung currently immunotherapy presents nausea vomiting/abdominal pain.  Also noted to have severe neutropenia.  # Nausea vomiting- Diarrhea-likely secondary to chemotherapy.  Diarrhea is unlikely related to immunotherapy-currently resolved.  CT scan abdomen pelvis-noncontrast [with its limitations] negative for any acute process.   # Neutropenia-secondary chemotherapy.   # Lung cancer-stage IV adenocarcinoma; status post cycle #2-approximately 1 week ago.   #History of multiple myeloma-currently under remission.   #Plan/recommendations:  #Given the absence of any acute findings noted on  CT scan; and  resolution of diarrhea-underlying cause of patient's symptoms seem to be related to breakthrough nausea vomiting from underlying chemotherapy versus infectious.  Clinically do not suspect any immunotherapy related autoimmune pathology.  #Severe neutropenia/SIRS-secondary chemotherapy; currently on broad-spectrum antibiotics including cefepime plus Flagyl.  Blood cultures currently pending.  Start the patient on Granix.  Patient will likely need growth factor support with subsequent cycles of chemotherapy.  Defer to Dr. Julien Nordmann.   Thank you Dr.Rizwan for allowing me to participate in the care of your pleasant patient. Please do not hesitate to contact me with questions or concerns in the interim.  Discussed with patient and her husband at the bedside.  Also informed Dr. Julien Nordmann patient's primary oncologist at the patient admission to the hospital.   # I reviewed the blood work- with the patient in detail; also reviewed the imaging independently [as summarized above]; and with the patient in detail.     Cammie Sickle, MD 02/26/2022 9:33 PM

## 2022-02-26 NOTE — Assessment & Plan Note (Addendum)
ANC of 900 suspect mostly related to chemotherapy Continue to monitor WBC Neutropenic precautions

## 2022-02-26 NOTE — ED Notes (Signed)
EDP, Karma Greaser at bedside; update provided to patient and family.

## 2022-02-26 NOTE — Progress Notes (Signed)
Fentanyl patch noted to R shoulder on back. Hospitalist made aware.

## 2022-02-26 NOTE — Assessment & Plan Note (Signed)
No acute issues.

## 2022-02-26 NOTE — Progress Notes (Signed)
Pharmacy Antibiotic Note  [name] is a 76 y.o. male admitted on 02/25/2022 with  intra-abdominal .  Pharmacy has been consulted for Cefepime dosing.  Plan: Cefepime 2 gm IV X 1 on 8/3 @ 0234. Cefepime 2 gm IV Q8H ordered to start on 8/3 @ 1030.   Height: 6\' 2"  (188 cm) Weight: 72.6 kg (160 lb) IBW/kg (Calculated) : 82.2  Temp (24hrs), Avg:98.2 F (36.8 C), Min:98.1 F (36.7 C), Max:98.2 F (36.8 C)  Recent Labs  Lab 02/19/22 0800 02/25/22 1932 02/26/22 0006  WBC 4.0 1.5*  1.7*  --   CREATININE 1.10 1.06  --   LATICACIDVEN  --   --  2.4*    Estimated Creatinine Clearance: 60.9 mL/min (by C-G formula based on SCr of 1.06 mg/dL).    Allergies  Allergen Reactions   Celebrex [Celecoxib] Nausea Only   Cymbalta [Duloxetine Hcl] Other (See Comments) and Hives    Weight loss Weight loss   Hydromorphone Hcl Itching   Meloxicam Itching   Oxycontin [Oxycodone] Itching    Also caused constipation, insomnia Also caused constipation, insomnia Also caused constipation, insomnia   Percocet [Oxycodone-Acetaminophen] Itching   Vicodin [Hydrocodone-Acetaminophen] Itching   Lyrica [Pregabalin] Rash    Lesions    Antimicrobials this admission:   >>    >>   Dose adjustments this admission:   Microbiology results:  BCx:   UCx:    Sputum:    MRSA PCR:   Thank you for allowing pharmacy to be a part of this patient's care.  Dustin Bumbaugh D 02/26/2022 3:15 AM

## 2022-02-26 NOTE — ED Notes (Signed)
Patient made aware of need for Urine specimen and Stool specimen; states he is unable to go at this time.  Advised to use call bell as the need arises. Expresses verbal understanding.

## 2022-02-26 NOTE — H&P (Addendum)
History and Physical    Patient: [name] KVQ:[id number] DOB: 1946/01/19 DOA: 02/25/2022 DOS: the patient was seen and examined on 02/26/2022 PCP: Jinny Sanders, MD  Patient coming from: Home  Chief Complaint:  Chief Complaint  Patient presents with   Nausea    HPI: [name] is a 76 y.o. male with medical history significant for HTN, prostate cancer s/p prostatectomy 2003, multiple myeloma in remission and more recently stage IV non-small cell lung cancer on palliative chemotherapy and immunotherapy, with last oncology visit and cycle 2 infusion therapy 02/19/2022 who presents to the ED with a 3-day history of nausea vomiting and diarrhea as well as severe right upper quadrant colicky abdominal pain.  He had chills but no fever.  Denies chest pain or shortness of breath.  He has had very poor oral intake. ED course and data review: Mildly tachycardic on arrival with pulse 96 but afebrile and BP 125/87 with O2 sat 98% on room air. Labs: WBC 1.7 with lactic acid 2.4.  Hemoglobin 11 and platelets WNL.  Lipase and LFTs WNL.  Creatinine at baseline at 1.06. Chest x-ray shows the following: IMPRESSION: 1. Decreased left pleural effusion from prior exam. Persistent pleuroparenchymal opacity at the left lung base. 2. Mild diffuse increased density in the left hemithorax may represent layering effusion or airspace disease, unchanged.  Patient was pancultured, GI panel ordered.  IV fluid bolus ordered. The ED provider consulted with oncologist Dr. Rogue Bussing who recommended antibiotics.  Patient subsequently started on Maxipime.  Hospitalist consulted for admission.     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 Leonie Man)   CATARACT EXTRACTION W/ INTRAOCULAR LENS IMPLANT Bilateral    DEXA  05/2011   spine 0.7, L femur 0.0, R femur -0.6, normal   KIDNEY STONE SURGERY     KIDNEY STONE SURGERY  06/2009   Stone removed from bladder   MELANOMA EXCISION Left 06/05/2019   Procedure: REEXCISION MELANOMA OF LEFT POSTERIOR AURICLE;  Surgeon: Johnathan Hausen, MD;  Location: [facility];  Service: General;  Laterality: Left;   PORTACATH PLACEMENT  08/05/11   right sided portacath   PROSTATECTOMY  04/06/07   TOE AMPUTATION Left 2017   2nd toe left foot   Social History:  reports that he quit smoking about 25 years ago. His smoking use included cigarettes. He has a 60.00 pack-year smoking history. He has never used smokeless tobacco. He reports that he does not drink alcohol and does not use drugs.  Allergies  Allergen Reactions   Celebrex [Celecoxib] Nausea Only  Cymbalta [Duloxetine Hcl] Other (See Comments) and Hives    Weight loss Weight loss   Hydromorphone Hcl Itching   Meloxicam Itching   Oxycontin [Oxycodone] Itching    Also caused constipation, insomnia Also caused constipation, insomnia Also caused constipation, insomnia   Percocet [Oxycodone-Acetaminophen] Itching   Vicodin [Hydrocodone-Acetaminophen] Itching   Lyrica [Pregabalin] Rash    Lesions    Family History  Problem Relation Age of Onset   Pneumonia Mother    Stroke Father    Cancer Brother         lung   Kidney cancer Sister    Colon cancer Neg Hx     Prior to Admission medications   Medication Sig Start Date End Date Taking? Authorizing Provider  aspirin 81 MG tablet Take 81 mg by mouth daily.   Yes [provider]  B Complex Vitamins (VITAMIN-B COMPLEX) TABS Take 1 tablet by mouth daily.   Yes [provider]  Calcium Carbonate-Vitamin D 600-400 MG-UNIT per tablet Take 1 tablet by mouth daily.   Yes [provider]  cholecalciferol (VITAMIN D) 1000 UNITS tablet Take 1,000 Units by mouth daily.   Yes [provider]  fentaNYL (DURAGESIC) 75 MCG/HR Place 1 patch onto the skin every 3 (three) days. 12/15/21  Yes [provider]  folic acid (FOLVITE) 1 MG tablet Take 1 tablet (1 mg total) by mouth daily. 01/19/22  Yes Curt Bears, MD  mirtazapine (REMERON) 15 MG tablet Take 15 mg by mouth at bedtime. 02/16/22  Yes [provider]  pantoprazole (PROTONIX) 40 MG tablet TAKE 1 TABLET BY MOUTH EVERY DAY 01/28/21  Yes Gorsuch, Ni, MD  potassium chloride SA (KLOR-CON M) 20 MEQ tablet Take 1 tablet (20 mEq total) by mouth daily. 02/19/22  Yes Heilingoetter, Cassandra L, PA-C  lidocaine-prilocaine (EMLA) cream Apply topically as needed. Apply to port site one hour before treatment and cover with plastic wrap 10/13/19   Heath Lark, MD  NONFORMULARY OR COMPOUNDED ITEM Baclofn/amitrp/ketamn cream topically to feet TID prn for neuropathy    [provider]  NUBEQA 300 MG tablet Take 600 mg by mouth 2 (two) times daily. 02/24/21   [provider]  ondansetron (ZOFRAN) 4 MG tablet Take 4 mg by mouth every 8 (eight) hours as needed. 02/16/22   [provider]  Oxycodone HCl 10 MG TABS Take 1-2 tablets by mouth 3 (three) times daily as needed. 02/10/22   [provider]  prochlorperazine (COMPAZINE) 10 MG tablet Take 1 tablet (10 mg total) by mouth every 6 (six) hours as needed for nausea or vomiting. 01/19/22    Curt Bears, MD    Physical Exam: Vitals:   02/25/22 1930 02/26/22 0000 02/26/22 0100 02/26/22 0200  BP:  (!) 131/116 (!) 143/80 (!) 144/79  Pulse:  81 82 73  Resp:  _0 Temp:      TempSrc:      SpO2:  100% 100% 99%  Weight: 72.6 kg     Height: _1  (1.88 m)      Physical Exam Vitals and nursing note reviewed.  Constitutional:      General: He is awake. He is not in acute distress.    Appearance: He is cachectic.  HENT:     Head: Normocephalic and atraumatic.  Cardiovascular:     Rate and Rhythm: Normal rate and regular rhythm.     Heart sounds: Normal heart sounds.  Pulmonary:     Effort: Pulmonary effort is  normal.     Breath sounds: Normal breath sounds.  Abdominal:     Palpations: Abdomen is soft.     Tenderness: There is abdominal tenderness in the right upper quadrant.  Neurological:     Mental Status: Mental status is at baseline.     Labs on Admission: I have personally reviewed following labs and imaging studies  CBC: Recent Labs  Lab 02/19/22 0800 02/25/22 1932  WBC 4.0 1.5*  1.7*  NEUTROABS 2.0 0.9*  HGB 10.1* 11.1*  11.0*  HCT 27.9* 33.4*  33.5*  MCV 92.4 99.1  98.8  PLT 227 231  448   Basic Metabolic Panel: Recent Labs  Lab 02/19/22 0800 02/25/22 1932  NA 136 135  K 3.2* 4.3  CL 98 103  CO2 29 23  GLUCOSE 107* 113*  BUN 15 25*  CREATININE 1.10 1.06  CALCIUM 8.9 8.7*   GFR: Estimated Creatinine Clearance: 60.9 mL/min (by C-G formula based on SCr of 1.06 mg/dL). Liver Function Tests: Recent Labs  Lab 02/19/22 0800 02/25/22 1932  AST 23 29  ALT 13 17  ALKPHOS 44 42  BILITOT 0.4 1.0  PROT 6.7 7.3  ALBUMIN 3.4* 3.5   Recent Labs  Lab 02/25/22 1932  LIPASE 27   No results for input(s): "AMMONIA" in the last 168 hours. Coagulation Profile: No results for input(s): "INR", "PROTIME" in the last 168 hours. Cardiac Enzymes: No results for input(s): "CKTOTAL", "CKMB", "CKMBINDEX", "TROPONINI" in the last 168  hours. BNP (last 3 results) No results for input(s): "PROBNP" in the last 8760 hours. HbA1C: No results for input(s): "HGBA1C" in the last 72 hours. CBG: No results for input(s): "GLUCAP" in the last 168 hours. Lipid Profile: No results for input(s): "CHOL", "HDL", "LDLCALC", "TRIG", "CHOLHDL", "LDLDIRECT" in the last 72 hours. Thyroid Function Tests: No results for input(s): "TSH", "T4TOTAL", "FREET4", "T3FREE", "THYROIDAB" in the last 72 hours. Anemia Panel: No results for input(s): "VITAMINB12", "FOLATE", "FERRITIN", "TIBC", "IRON", "RETICCTPCT" in the last 72 hours. Urine analysis:    Component Value Date/Time   COLORURINE YELLOW 07/06/2011 0725   APPEARANCEUR CLEAR 07/06/2011 0725   LABSPEC 1.011 07/06/2011 0725   PHURINE 6.0 07/06/2011 0725   GLUCOSEU NEGATIVE 07/06/2011 0725   HGBUR MODERATE (A) 07/06/2011 0725   BILIRUBINUR NEGATIVE 07/06/2011 0725   KETONESUR NEGATIVE 07/06/2011 0725   PROTEINUR <3 08/07/2011 0928   UROBILINOGEN 0.2 07/06/2011 0725   NITRITE NEGATIVE 07/06/2011 0725   LEUKOCYTESUR NEGATIVE 07/06/2011 0725    Radiological Exams on Admission: DG Chest Port 1 View  Result Date: 02/26/2022 CLINICAL DATA:  Questionable sepsis - evaluate for abnormality Nausea, vomiting and diarrhea. History of lung cancer, active chemotherapy. EXAM: PORTABLE CHEST 1 VIEW COMPARISON:  Radiograph 12/18/2021 FINDINGS: Left chest port remains in place. Decreased left pleural effusion with persistent pleuroparenchymal opacity at the left lung base. Mild diffuse increased density in the left hemithorax may represent layering effusion or airspace disease. This is similar to prior. Known left lung mass is not well seen by radiograph. Mild atelectasis at the right lung base. Stable heart size and mediastinal contours. No pneumothorax. IMPRESSION: 1. Decreased left pleural effusion from prior exam. Persistent pleuroparenchymal opacity at the left lung base. 2. Mild diffuse increased density  in the left hemithorax may represent layering effusion or airspace disease, unchanged. Electronically Signed   By: Keith Rake M.D.   On: 02/26/2022 00:04     Data Reviewed: Relevant notes from primary care and specialist visits, past discharge summaries as  available in EHR, including Care Everywhere. 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 lfts and lipase normal Will get CT abd and pelvis to evaluate further  Leukopenia ANC of 900 suspect mostly related to chemotherapy Continue to monitor WBC Neutropenic precautions  Chronic prescription opiate use On fentanyl  History of prostate cancer s/p prostatectomy/history of multiple myeloma in remission No acute issues  Lung cancer ([place]) Last chemotherapy was 7/27 Followed by Dr. Earlie Server  Anemia of chronic disease Hemoglobin at baseline at 25.8 Continue folic acid      DVT prophylaxis: Lovenox  Consults: none  Advance Care Planning:   Code Status: Prior   Family Communication: none  Disposition Plan: Back to previous home environment  Severity of Illness: The appropriate patient status for this patient is INPATIENT. Inpatient status is judged to be  reasonable and necessary in order to provide the required intensity of service to ensure the patient's safety. The patient's presenting symptoms, physical exam findings, and initial radiographic and laboratory data in the context of their chronic comorbidities is felt to place them at high risk for further clinical deterioration. Furthermore, it is not anticipated that the patient will be medically stable for discharge from the hospital within 2 midnights of admission.   * I certify that at the point of admission it is my clinical judgment that the patient will require inpatient hospital care spanning beyond 2 midnights from the point of admission due to high intensity of service, high risk for further deterioration and high frequency of surveillance required.*  Author: Athena Masse, MD 02/26/2022 2:50 AM  For on call review [email or web address].

## 2022-02-26 NOTE — Telephone Encounter (Signed)
Per 8/3 phone line pt's wife called and said pt was in the hospital and canceled appointment

## 2022-02-27 DIAGNOSIS — R197 Diarrhea, unspecified: Secondary | ICD-10-CM | POA: Diagnosis not present

## 2022-02-27 DIAGNOSIS — K529 Noninfective gastroenteritis and colitis, unspecified: Secondary | ICD-10-CM | POA: Diagnosis not present

## 2022-02-27 DIAGNOSIS — C3492 Malignant neoplasm of unspecified part of left bronchus or lung: Secondary | ICD-10-CM | POA: Diagnosis not present

## 2022-02-27 DIAGNOSIS — E43 Unspecified severe protein-calorie malnutrition: Secondary | ICD-10-CM | POA: Insufficient documentation

## 2022-02-27 DIAGNOSIS — Z79891 Long term (current) use of opiate analgesic: Secondary | ICD-10-CM | POA: Diagnosis not present

## 2022-02-27 LAB — BASIC METABOLIC PANEL
Anion gap: 7 (ref 5–15)
Anion gap: 8 (ref 5–15)
BUN: 25 mg/dL — ABNORMAL HIGH (ref 8–23)
BUN: 27 mg/dL — ABNORMAL HIGH (ref 8–23)
CO2: 25 mmol/L (ref 22–32)
CO2: 24 mmol/L (ref 22–32)
Calcium: 8.6 mg/dL — ABNORMAL LOW (ref 8.9–10.3)
Calcium: 8.8 mg/dL — ABNORMAL LOW (ref 8.9–10.3)
Chloride: 107 mmol/L (ref 98–111)
Chloride: 107 mmol/L (ref 98–111)
Creatinine, Ser: 1.01 mg/dL (ref 0.61–1.24)
Creatinine, Ser: 1.06 mg/dL (ref 0.61–1.24)
GFR, Estimated: >60 mL/min (ref >60)
GFR, Estimated: >60 mL/min (ref >60)
Glucose, Bld: 99 mg/dL (ref 70–99)
Glucose, Bld: 109 mg/dL — ABNORMAL HIGH (ref 70–99)
Potassium: 3.4 mmol/L — ABNORMAL LOW (ref 3.5–5.1)
Potassium: 3.8 mmol/L (ref 3.5–5.1)
Sodium: 139 mmol/L (ref 135–145)
Sodium: 139 mmol/L (ref 135–145)

## 2022-02-27 LAB — CBC WITH DIFFERENTIAL/PLATELET
Abs Immature Granulocytes: 0.33 10*3/uL — ABNORMAL HIGH (ref 0.00–0.07)
Basophils Absolute: 0 10*3/uL (ref 0.0–0.1)
Basophils Relative: 0 %
Eosinophils Absolute: 0 10*3/uL (ref 0.0–0.5)
Eosinophils Relative: 0 %
HCT: 24.5 % — ABNORMAL LOW (ref 39.0–52.0)
Hemoglobin: 8.4 g/dL — ABNORMAL LOW (ref 13.0–17.0)
Immature Granulocytes: 11 %
Lymphocytes Relative: 21 %
Lymphs Abs: 0.6 10*3/uL — ABNORMAL LOW (ref 0.7–4.0)
MCH: 32.7 pg (ref 26.0–34.0)
MCHC: 34.3 g/dL (ref 30.0–36.0)
MCV: 95.3 fL (ref 80.0–100.0)
Monocytes Absolute: 0.1 10*3/uL (ref 0.1–1.0)
Monocytes Relative: 3 %
Neutro Abs: 1.9 10*3/uL (ref 1.7–7.7)
Neutrophils Relative %: 65 %
Platelets: 107 10*3/uL — ABNORMAL LOW (ref 150–400)
RBC: 2.57 MIL/uL — ABNORMAL LOW (ref 4.22–5.81)
RDW: 11.5 % (ref 11.5–15.5)
Smear Review: NORMAL
WBC: 2.8 10*3/uL — ABNORMAL LOW (ref 4.0–10.5)
nRBC: 0 % (ref 0.0–0.2)

## 2022-02-27 LAB — THYROID PANEL WITH TSH
Free Thyroxine Index: 1.5 (ref 1.2–4.9)
T3 Uptake Ratio: 28 % (ref 24–39)
T4, Total: 5.5 ug/dL (ref 4.5–12.0)
TSH: 0.793 u[IU]/mL (ref 0.450–4.500)

## 2022-02-27 LAB — MAGNESIUM: Magnesium: 1.5 mg/dL — ABNORMAL LOW (ref 1.7–2.4)

## 2022-02-27 MED ORDER — POTASSIUM CHLORIDE CRYS ER 20 MEQ PO TBCR
40.0000 meq | EXTENDED_RELEASE_TABLET | ORAL | Status: AC
  Administered 2022-02-27 (×2): 40 meq via ORAL
  Filled 2022-02-27 (×2): qty 2

## 2022-02-27 MED ORDER — MAGNESIUM SULFATE 2 GM/50ML IV SOLN
2.0000 g | Freq: Once | INTRAVENOUS | Status: AC
  Administered 2022-02-27: 2 g via INTRAVENOUS
  Filled 2022-02-27: qty 50

## 2022-02-27 NOTE — Progress Notes (Signed)
[facility] Progress Note  Patient: [name]     TTS:[id number]  DOA: 02/25/2022   PCP: Jinny Sanders, MD       Brief hospital course: This is a 76 year old male currently being treated for stage IV non-small cell cancer (palliative chemo with immunotherapy) status post cycle 2 infusion on 7/27, prostate cancer status post prostatectomy in 2003, multiple myeloma in remission, hypertension. This patient presented to the hospital on 8/2 with complaints of abdominal pain nausea vomiting and diarrhea for 2 to 3 days.  The patient states that he has had at least 3-4 episodes of diarrhea daily and vomiting daily. In the ED: WBC count 1.7, creatinine 1.06, lactic acid 2.4, heart rate in the 90s.  Case was discussed with oncology Dr. Rogue Bussing who recommended the patient be placed on antibiotics.  The patient was started on Maxipime and metronidazole. Stool studies were ordered. Subjective:  No more loose stools. He is barely eating but wants to go home.   Assessment and Plan: Principal Problem:   Nausea/ vomiting abdominal pain and diarrhea Severe malnutrition -CT scan of the abdomen pelvis without contrast was unrevealing as to the etiology - ? gastroenteritis - May also be related to [facility] - Blood cultures negative so far - UA not done but no urinary symptoms - Stool studies unable to be collected so far - Continue clear liquids- he has not had any so far - advance to solid food as diarrhea resolved - cont antibiotics - stop IVF and follow oral intake - calorie count ordered   Active Problems: Leukopenia - possibly related to recent chemo - WBC 1.5> 1.4> 2.8 - Neutrophils 900> 1,900   - Granix daily ordered by Oncology daily on 8/3  Hypokalemia and Hypomagnesemia - due to poor oral intake - replace and recheck tomorrow  Urinary retention - foley placed on 8.3 and Flomax added  Adenocarcinoma/non-small cell lung cancer (May 2023) - Stage IV - Malignant  left pleural effusion and pericardiac lymphadenopathy - Receiving carboplatin, Altima and Keytruda    Poor appetite - Recently received a Medrol Dosepak by oncology    SIRS , probable sepsis (systemic inflammatory response syndrome) (HCC) - f/u work up to identify an infection     Essential hypertension, benign - not on meds for this follow    History of prostate cancer s/p prostatectomy/history of multiple myeloma in remission - Nubeqa last filled in March- will resume if he is still taking it    Chronic prescription opiate use - resume oxycodone 10-10 mg TID PRN - cont 75 mcg Fentanyl pacth   DVT prophylaxis:  enoxaparin (LOVENOX) injection 40 mg Start: 02/26/22 0800     Code Status: Full Code  Consultants: oncology Level of Care: Level of care: Telemetry Medical Disposition Plan:  Status is: Inpatient Remains inpatient appropriate because: f/u oral intake  Objective:   Vitals:   02/27/22 0041 02/27/22 0538 02/27/22 0839 02/27/22 1142  BP: 122/69 123/76 127/75 124/85  Pulse: 81 86 81 86  Resp: '16 16 19 17  ' Temp: 98.3 F (36.8 C) 98.8 F (37.1 C) 98.2 F (36.8 C) 98.4 F (36.9 C)  TempSrc: Oral Oral    SpO2: 96% 97% 96% 99%  Weight:      Height:       Filed Weights   02/25/22 1930  Weight: 72.6 kg   Exam: General exam: Appears comfortable  HEENT: PERRLA, oral mucosa moist, no sclera icterus or thrush Respiratory system: Clear to auscultation. Respiratory  effort normal. Cardiovascular system: S1 & S2 heard, regular rate and rhythm Gastrointestinal system: Abdomen soft, non-tender, nondistended. Normal bowel sounds   Central nervous system: Alert and oriented. No focal neurological deficits. Extremities: No cyanosis, clubbing or edema Skin: No rashes or ulcers Psychiatry:  Mood & affect appropriate.   Imaging and lab data was personally reviewed    CBC: Recent Labs  Lab 02/25/22 1932 02/26/22 0405 02/27/22 1002  WBC 1.5*  1.7* 1.4*  1.4* 2.8*   NEUTROABS 0.9* 0.9* 1.9  HGB 11.1*  11.0* 9.1*  9.0* 8.4*  HCT 33.4*  33.5* 26.2*  25.8* 24.5*  MCV 99.1  98.8 94.2  93.8 95.3  PLT 231  217 193  169 107*    Basic Metabolic Panel: Recent Labs  Lab 02/25/22 1932 02/26/22 0405 02/27/22 1002  NA 135 137 139  K 4.3 4.3 3.4*  CL 103 104 107  CO2 '23 24 25  ' GLUCOSE 113* 117* 99  BUN 25* 26* 25*  CREATININE 1.06 1.07 1.01  CALCIUM 8.7* 8.6* 8.6*  MG  --   --  1.5*    GFR: Estimated Creatinine Clearance: 63.9 mL/min (by C-G formula based on SCr of 1.01 mg/dL).  Scheduled Meds:  (feeding supplement) PROSource Plus  30 mL Oral BID BM   aspirin EC  81 mg Oral Daily   Chlorhexidine Gluconate Cloth  6 each Topical Daily   enoxaparin (LOVENOX) injection  40 mg Subcutaneous Q24H   feeding supplement  1 Container Oral TID BM   fentaNYL  1 patch Transdermal Q72H   mirtazapine  15 mg Oral QHS   multivitamin with minerals  1 tablet Oral Daily   pantoprazole  40 mg Oral Daily   potassium chloride  40 mEq Oral Q4H   tamsulosin  0.4 mg Oral QPC supper   Tbo-filgastrim (GRANIX) SQ  480 mcg Subcutaneous Daily   Continuous Infusions:  ceFEPime (MAXIPIME) IV 2 g (02/27/22 1050)   magnesium sulfate bolus IVPB     metronidazole 500 mg (02/27/22 0401)     LOS: 1 day   Author: Debbe Odea  02/27/2022 1:35 PM

## 2022-02-27 NOTE — Progress Notes (Signed)
Nutrition Follow-up  DOCUMENTATION CODES:   Severe malnutrition in context of chronic illness  INTERVENTION:   -Continue MVI with minerals daily -D/c Boost Breeze -D/c Prosource Plus -Magic cup TID with meals, each supplement provides 290 kcal and 9 grams of protein  -Initiate 48 hour calorie count per MD request; RD to follow-up on Monday 03/02/22 for further results  NUTRITION DIAGNOSIS:   Severe Malnutrition related to chronic illness (lung cancer) as evidenced by percent weight loss, severe fat depletion, severe muscle depletion.  Ongoing  GOAL:   Patient will meet greater than or equal to 90% of their needs  Progressing   MONITOR:   PO intake, Supplement acceptance, Diet advancement  REASON FOR ASSESSMENT:   Malnutrition Screening Tool    ASSESSMENT:   Pt with medical history significant for HTN, prostate cancer s/p prostatectomy 2003, multiple myeloma in remission and more recently stage IV non-small cell lung cancer on palliative chemotherapy and immunotherapy, with last oncology visit and cycle 2 infusion therapy 02/19/2022 who presents with a 3-day history of nausea vomiting and diarrhea as well as severe right upper quadrant colicky abdominal pain.  Reviewed I/O's: -340 ml x 24 hours and +759 ml since admission  UOP: 900 ml x 24 hours   Pt advanced to a regular diet today. Pt reports he hasn't eaten anything but a whole cup of applesauce with his medications. He has not had any diarrhea.   MD requesting calorie count. Offered to order pt meal tray, however, he refused. Pt reports he drank one supplement today (Ensure), but did not like it. RD offered other supplements, however, pt refused. He shares he drinks supplements regularly at home, but does not know the brand- he describes home supplement as peanut butter flavored and can be purchased at [employer].   Discussed importance of good meal intake to promote healing. Encouraged pt to eat secondary to calorie count.  Pt reports he is leaving tomorrow "because I have things to do".   Medications reviewed and include remeron and potassium.   Labs reviewed: K: 3.4, Phos: 7.2, Mg: 1.5, CBGS: 104 (inpatient orders for glycemic control are none).    Diet Order:   Diet Order             Diet regular Room service appropriate? Yes; Fluid consistency: Thin  Diet effective now                   EDUCATION NEEDS:   Not appropriate for education at this time  Skin:  Skin Assessment: Reviewed RN Assessment  Last BM:  02/25/22  Height:   Ht Readings from Last 1 Encounters:  02/25/22 _0  (1.88 m)    Weight:   Wt Readings from Last 1 Encounters:  02/25/22 72.6 kg    Ideal Body Weight:  86.4 kg  BMI:  Body mass index is 20.54 kg/m.  Estimated Nutritional Needs:   Kcal:  2200-2400  Protein:  110-125 grams  Fluid:  > 2 L    Loistine Chance, RD, LDN, [place] Registered Dietitian II Certified Diabetes Care and Education Specialist Please refer to [facility] for RD and/or RD on-call/weekend/after hours pager

## 2022-02-27 NOTE — Progress Notes (Signed)
Pharmacy Antibiotic Note  [name] is a 76 y.o. male admitted on 02/25/2022 with  intra-abdominal .  Pharmacy has been consulted for Cefepime dosing.  -also on metronidazole  Plan: Cefepime 2 gm IV Q8H     Height: 6\' 2"  (188 cm) Weight: 72.6 kg (160 lb) IBW/kg (Calculated) : 82.2  Temp (24hrs), Avg:98.3 F (36.8 C), Min:98 F (36.7 C), Max:98.8 F (37.1 C)  Recent Labs  Lab 02/25/22 1932 02/26/22 0006 02/26/22 0405 02/27/22 1002  WBC 1.5*  1.7*  --  1.4*  1.4* 2.8*  CREATININE 1.06  --  1.07 1.01  LATICACIDVEN  --  2.4* 1.7  --      Estimated Creatinine Clearance: 63.9 mL/min (by C-G formula based on SCr of 1.01 mg/dL).    Allergies  Allergen Reactions   Celebrex [Celecoxib] Nausea Only   Cymbalta [Duloxetine Hcl] Other (See Comments) and Hives    Weight loss Weight loss   Hydromorphone Hcl Itching   Meloxicam Itching   Oxycontin [Oxycodone] Itching    Also caused constipation, insomnia Also caused constipation, insomnia Also caused constipation, insomnia   Percocet [Oxycodone-Acetaminophen] Itching   Vicodin [Hydrocodone-Acetaminophen] Itching   Lyrica [Pregabalin] Rash    Lesions    Antimicrobials this admission:  Cefepime 8/3>> Metronidazole 8/3>>  Dose adjustments this admission:   Microbiology results: 8/3  BCx: [id number]  UCx:    Sputum:    MRSA PCR:  Cdiff pend GI panel pend  Thank you for allowing pharmacy to be a part of this patient's care.  Adonia Porada A 02/27/2022 11:38 AM

## 2022-02-28 DIAGNOSIS — D849 Immunodeficiency, unspecified: Secondary | ICD-10-CM

## 2022-02-28 DIAGNOSIS — E43 Unspecified severe protein-calorie malnutrition: Secondary | ICD-10-CM

## 2022-02-28 DIAGNOSIS — K529 Noninfective gastroenteritis and colitis, unspecified: Secondary | ICD-10-CM | POA: Diagnosis not present

## 2022-02-28 DIAGNOSIS — T451X5A Adverse effect of antineoplastic and immunosuppressive drugs, initial encounter: Secondary | ICD-10-CM

## 2022-02-28 DIAGNOSIS — Z859 Personal history of malignant neoplasm, unspecified: Secondary | ICD-10-CM

## 2022-02-28 DIAGNOSIS — D701 Agranulocytosis secondary to cancer chemotherapy: Secondary | ICD-10-CM

## 2022-02-28 DIAGNOSIS — D709 Neutropenia, unspecified: Secondary | ICD-10-CM

## 2022-02-28 DIAGNOSIS — A419 Sepsis, unspecified organism: Principal | ICD-10-CM

## 2022-02-28 DIAGNOSIS — D638 Anemia in other chronic diseases classified elsewhere: Secondary | ICD-10-CM

## 2022-02-28 DIAGNOSIS — C3492 Malignant neoplasm of unspecified part of left bronchus or lung: Secondary | ICD-10-CM | POA: Diagnosis not present

## 2022-02-28 DIAGNOSIS — I10 Essential (primary) hypertension: Secondary | ICD-10-CM

## 2022-02-28 DIAGNOSIS — D61818 Other pancytopenia: Secondary | ICD-10-CM | POA: Diagnosis not present

## 2022-02-28 DIAGNOSIS — R531 Weakness: Secondary | ICD-10-CM | POA: Diagnosis not present

## 2022-02-28 LAB — MAGNESIUM: Magnesium: 2 mg/dL (ref 1.7–2.4)

## 2022-02-28 LAB — CBC WITH DIFFERENTIAL/PLATELET
Abs Immature Granulocytes: 0.27 10*3/uL — ABNORMAL HIGH (ref 0.00–0.07)
Basophils Absolute: 0 10*3/uL (ref 0.0–0.1)
Basophils Relative: 0 %
Eosinophils Absolute: 0 10*3/uL (ref 0.0–0.5)
Eosinophils Relative: 0 %
HCT: 22.4 % — ABNORMAL LOW (ref 39.0–52.0)
Hemoglobin: 7.9 g/dL — ABNORMAL LOW (ref 13.0–17.0)
Immature Granulocytes: 11 %
Lymphocytes Relative: 25 %
Lymphs Abs: 0.6 10*3/uL — ABNORMAL LOW (ref 0.7–4.0)
MCH: 33.6 pg (ref 26.0–34.0)
MCHC: 35.3 g/dL (ref 30.0–36.0)
MCV: 95.3 fL (ref 80.0–100.0)
Monocytes Absolute: 0.2 10*3/uL (ref 0.1–1.0)
Monocytes Relative: 6 %
Neutro Abs: 1.5 10*3/uL — ABNORMAL LOW (ref 1.7–7.7)
Neutrophils Relative %: 58 %
Platelets: 76 10*3/uL — ABNORMAL LOW (ref 150–400)
RBC: 2.35 MIL/uL — ABNORMAL LOW (ref 4.22–5.81)
RDW: 11.4 % — ABNORMAL LOW (ref 11.5–15.5)
WBC: 2.5 10*3/uL — ABNORMAL LOW (ref 4.0–10.5)
nRBC: 0 % (ref 0.0–0.2)

## 2022-02-28 MED ORDER — ORAL CARE MOUTH RINSE: 15.0000 mL | OROMUCOSAL | Status: DC | PRN

## 2022-02-28 MED ORDER — METRONIDAZOLE 500 MG PO TABS
500.0000 mg | ORAL_TABLET | Freq: Two times a day (BID) | ORAL | Status: DC
  Administered 2022-02-28: 500 mg via ORAL
  Filled 2022-02-28: qty 1

## 2022-02-28 MED ORDER — HEPARIN SOD (PORK) LOCK FLUSH 100 UNIT/ML IV SOLN
500.0000 [IU] | Freq: Once | INTRAVENOUS | Status: AC
  Administered 2022-02-28: 500 [IU] via INTRAVENOUS
  Filled 2022-02-28: qty 5

## 2022-02-28 MED ORDER — METRONIDAZOLE 500 MG PO TABS: 500.0000 mg | ORAL_TABLET | Freq: Two times a day (BID) | ORAL | 0 refills | Status: DC

## 2022-02-28 MED ORDER — CEFDINIR 300 MG PO CAPS: 300.0000 mg | ORAL_CAPSULE | Freq: Two times a day (BID) | ORAL | 0 refills | Status: AC

## 2022-02-28 NOTE — Progress Notes (Signed)
[name]   DOB:Jul 31, 1945   PF#:[id number]    Subjective: Patient resting in the bed.  He is having his breakfast.  Nausea vomiting resolved.  No chest pain or shortness of breath.  Mild cough.  Eager to go home.  Objective:  Vitals:   02/28/22 0841 02/28/22 1614  BP: 110/69 100/85  Pulse: 72 97  Resp: 19 20  Temp: 98.5 F (36.9 C) 97.8 F (36.6 C)  SpO2: 98% 98%     Intake/Output Summary (Last 24 hours) at 02/28/2022 2209 Last data filed at 02/28/2022 1200 Gross per 24 hour  Intake 340 ml  Output 900 ml  Net -560 ml    Physical Exam Vitals and nursing note reviewed.  HENT:     Head: Normocephalic and atraumatic.     Mouth/Throat:     Pharynx: Oropharynx is clear.  Eyes:     Extraocular Movements: Extraocular movements intact.     Pupils: Pupils are equal, round, and reactive to light.  Cardiovascular:     Rate and Rhythm: Normal rate and regular rhythm.  Pulmonary:     Comments: Decreased breath sounds bilaterally.  Abdominal:     Palpations: Abdomen is soft.  Musculoskeletal:        General: Normal range of motion.     Cervical back: Normal range of motion.  Skin:    General: Skin is warm.  Neurological:     General: No focal deficit present.     Mental Status: He is alert and oriented to person, place, and time.  Psychiatric:        Behavior: Behavior normal.        Judgment: Judgment normal.      Labs:  Lab Results  Component Value Date   WBC 2.5 (L) 02/28/2022   HGB 7.9 (L) 02/28/2022   HCT 22.4 (L) 02/28/2022   MCV 95.3 02/28/2022   PLT 76 (L) 02/28/2022   NEUTROABS 1.5 (L) 02/28/2022    Lab Results  Component Value Date   NA 139 02/27/2022   K 3.8 02/27/2022   CL 107 02/27/2022   CO2 24 02/27/2022    Studies:  No results found.  # 76 yo patient with multiple co-morbidities diagnosed stage IV lung currently immunotherapy presents nausea vomiting/abdominal pain.  Also noted to have severe neutropenia.   # Nausea vomiting-  Diarrhea-likely secondary to chemotherapy.  Diarrhea is unlikely related to immunotherapy-currently resolved;m without any interventions with steroids.  CT scan abdomen pelvis-noncontrast [with its limitations] negative for any acute process.    # Neutropenia-secondary chemotherapy; s/p Granix improved.  Consider growth factor support with subsequent cycles.    # Lung cancer-stage IV adenocarcinoma; status post cycle #2-approximately 1 week ago.  Patient concerned about ongoing side effects/decrement in quality of life from ongoing chemo-immunotherapy.  Encouraged the patient to talk to Dr. Julien Nordmann regarding other options.  Recommended patient call Dr. Worthy Flank office for an appointment in the interim/hospital follow-up   #History of multiple myeloma-currently under remission.    Cammie Sickle, MD 02/28/2022  10:09 PM

## 2022-02-28 NOTE — Progress Notes (Signed)
MD order received in [facility] to discharge pt home today; verbally reviewed AVS with pt, Rxs escribed to [place], pt will pick up on Sunday, 03/01/22; no questions voiced at this time; pt getting dressed and will call out when he is ready; pt will discharge via wheelchair by nursing to the ED where is wife is currently a pt

## 2022-02-28 NOTE — Discharge Summary (Signed)
Physician Discharge Summary  [name] VQX:[id number] DOB: 1945/09/27 DOA: 02/25/2022  PCP: Jinny Sanders, MD  Admit date: 02/25/2022 Discharge date: 02/28/2022 Discharging to: home Recommendations for Outpatient Follow-up:  Please f/u pancytopenia with a CBC at next visit  Consults:  oncology Procedures:  none   Discharge Diagnoses:   Principal Problem:   Acute gastroenteritis Active Problems:   SIRS , probable sepsis (systemic inflammatory response syndrome) (HCC)   Leukopenia   Essential hypertension, benign   Anemia of chronic disease   Acquired pancytopenia ([place])   Port catheter in place   History of prostate cancer s/p prostatectomy/history of multiple myeloma in remission   Chronic prescription opiate use   Adenocarcinoma of left lung, stage 4 ([place])   Protein-calorie malnutrition, severe     Hospital Course:  This is a 76 year old male currently being treated for stage IV non-small cell cancer (palliative chemo with immunotherapy) status post cycle 2 infusion on 7/27, prostate cancer status post prostatectomy in 2003, multiple myeloma in remission, hypertension. This patient presented to the hospital on 8/2 with complaints of abdominal pain nausea vomiting and diarrhea for 2 to 3 days.  The patient states that he has had at least 3-4 episodes of diarrhea daily and vomiting daily.Complained of RUQ pain.   In the ED: WBC count 1.7, creatinine 1.06, lactic acid 2.4, heart rate in the 90s.  Case was discussed with oncology Dr. Rogue Bussing who recommended the patient be placed on antibiotics.  The patient was started on Maxipime and metronidazole. Stool studies were ordered.  Principal Problem:   Nausea/ vomiting abdominal pain and diarrhea- suspected gastroenteritis Severe malnutrition -CT scan of the abdomen pelvis without contrast was unrevealing as to the etiology - ? gastroenteritis - Blood cultures negative so far - UA not done but no urinary symptoms - Stool  studies unable to be collected and diarrhea resolved - Continue clear liquids- he has not had any so far - Advance to solid food yesterday-initially had very poor appetite but this is improved and he is eating more- multiple supplements ordered by nutrition which will be prescribed --We will continue antibiotics for another 2 days for a total of 5 days  Active Problems: Leukopenia - possibly related to recent chemo - WBC 1.5> 1.4> 2.8> 2.5 - Neutrophils 900> 1,900   - Granix daily ordered by Oncology daily on 8/3  Thrombocytopenia - likely from chemo - follow as outpt   Hypokalemia and Hypomagnesemia - due to poor oral intake and diarrhea - replaced     Urinary retention - foley placed on 8/3   - was able to void today   Adenocarcinoma/non-small cell lung cancer (May 2023) - Stage IV - 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 Nutrition Status: Nutrition Problem: Severe Malnutrition Etiology: chronic illness (lung cancer) Signs/Symptoms: percent weight loss, severe fat depletion, severe muscle depletion Interventions: MVI, Magic cup    Discharge Instructions  Discharge Instructions     Diet general  Complete by: As directed    Eat a balanced, healthy diet   Increase activity slowly   Complete by: As directed       Allergies as of 02/28/2022       Reactions   Celebrex [celecoxib] Nausea Only   Cymbalta [duloxetine Hcl] Other (See Comments), Hives   Weight loss Weight loss    Hydromorphone Hcl Itching   Meloxicam Itching   Oxycontin [oxycodone] Itching   Also caused constipation, insomnia Also caused constipation, insomnia Also caused constipation, insomnia   Percocet [oxycodone-acetaminophen] Itching   Vicodin [hydrocodone-acetaminophen] Itching   Lyrica [pregabalin] Rash   Lesions        Medication List     TAKE these medications    aspirin 81 MG tablet Take 81 mg by mouth daily.   Calcium Carbonate-Vitamin D 600-400 MG-UNIT tablet Take 1 tablet by mouth daily.   cefdinir 300 MG capsule Commonly known as: OMNICEF Take 1 capsule (300 mg total) by mouth 2 (two) times daily for 2 days.   cholecalciferol 1000 units tablet Commonly known as: VITAMIN D Take 1,000 Units by mouth daily.   fentaNYL 75 MCG/HR Commonly known as: Downs 1 patch onto the skin every 3 (three) days.   folic acid 1 MG tablet Commonly known as: FOLVITE Take 1 tablet (1 mg total) by mouth daily.   lidocaine-prilocaine cream Commonly known as: EMLA Apply topically as needed. Apply to port site one hour before treatment and cover with plastic wrap   metroNIDAZOLE 500 MG tablet Commonly known as: FLAGYL Take 1 tablet (500 mg total) by mouth every 12 (twelve) hours.   mirtazapine 15 MG tablet Commonly known as: REMERON Take 15 mg by mouth at bedtime.   NONFORMULARY OR COMPOUNDED ITEM Baclofn/amitrp/ketamn cream topically to feet TID prn for neuropathy   Nubeqa 300 MG tablet Generic drug: darolutamide Take 600 mg by mouth 2 (two) times daily.   ondansetron 4 MG tablet Commonly known as: ZOFRAN Take 4 mg by mouth every 8 (eight) hours as needed.   Oxycodone HCl 10 MG Tabs Take 1-2 tablets by mouth 3 (three) times daily as needed.   pantoprazole 40 MG tablet Commonly known as: PROTONIX TAKE 1 TABLET BY MOUTH EVERY DAY   potassium chloride SA 20 MEQ tablet Commonly known as: KLOR-CON M Take 1 tablet (20 mEq total) by mouth daily.    prochlorperazine 10 MG tablet Commonly known as: COMPAZINE Take 1 tablet (10 mg total) by mouth every 6 (six) hours as needed for nausea or vomiting.   Vitamin-B Complex Tabs Take 1 tablet by mouth daily.            The results of significant diagnostics from this hospitalization (including imaging, microbiology, ancillary and laboratory) are listed below for reference.    CT ABDOMEN PELVIS WO CONTRAST  Result Date: 02/26/2022 CLINICAL DATA:  Nausea, vomiting.  History of prostate cancer. EXAM: CT ABDOMEN AND PELVIS WITHOUT CONTRAST TECHNIQUE: Multidetector CT imaging of the abdomen and pelvis was performed following the standard protocol without IV contrast. RADIATION DOSE REDUCTION: This exam was performed according to the departmental dose-optimization program which includes automated exposure control, adjustment of the mA and/or kV according to patient size and/or use of iterative reconstruction technique. COMPARISON:  January 17, 2020. FINDINGS: Lower chest: Small left pleural effusion is noted with adjacent subsegmental atelectasis of the left lower lobe. Hepatobiliary: No focal liver abnormality is seen. Mild cholelithiasis is noted without biliary dilatation. Pancreas: Unremarkable. No pancreatic ductal  dilatation or surrounding inflammatory changes. Spleen: Normal in size without focal abnormality. Adrenals/Urinary Tract: Adrenal glands appear normal. Bilateral nonobstructive nephrolithiasis is noted. No hydronephrosis or renal obstruction is noted. Urinary bladder is unremarkable. Stable left renal cyst is noted for which no further follow-up is required. Stomach/Bowel: Stomach is within normal limits. Appendix appears normal. No evidence of bowel wall thickening, distention, or inflammatory changes. Vascular/Lymphatic: Aortic atherosclerosis. No enlarged abdominal or pelvic lymph nodes. Reproductive: Status post prostatectomy. Other: No abdominal wall hernia or abnormality. No  abdominopelvic ascites. Musculoskeletal: Status post kyphoplasty at T12, L2, L3 and L4. Stable old right parasymphyseal fracture is noted. No definite acute osseous abnormality is noted. IMPRESSION: Small left pleural effusion is noted with adjacent subsegmental atelectasis of the left lower lobe. Mild cholelithiasis without inflammation. Bilateral nonobstructive nephrolithiasis. Status post prostatectomy. Aortic Atherosclerosis (ICD10-I70.0). Electronically Signed   By: Marijo Conception M.D.   On: 02/26/2022 08:51   DG Chest Port 1 View  Result Date: 02/26/2022 CLINICAL DATA:  Questionable sepsis - evaluate for abnormality Nausea, vomiting and diarrhea. History of lung cancer, active chemotherapy. EXAM: PORTABLE CHEST 1 VIEW COMPARISON:  Radiograph 12/18/2021 FINDINGS: Left chest port remains in place. Decreased left pleural effusion with persistent pleuroparenchymal opacity at the left lung base. Mild diffuse increased density in the left hemithorax may represent layering effusion or airspace disease. This is similar to prior. Known left lung mass is not well seen by radiograph. Mild atelectasis at the right lung base. Stable heart size and mediastinal contours. No pneumothorax. IMPRESSION: 1. Decreased left pleural effusion from prior exam. Persistent pleuroparenchymal opacity at the left lung base. 2. Mild diffuse increased density in the left hemithorax may represent layering effusion or airspace disease, unchanged. Electronically Signed   By: Keith Rake M.D.   On: 02/26/2022 00:04   Labs:   Basic Metabolic Panel: Recent Labs  Lab 02/25/22 1932 02/26/22 0405 02/27/22 1002 02/27/22 1420 02/28/22 0551  NA 135 137 139 139  --   K 4.3 4.3 3.4* 3.8  --   CL 103 104 107 107  --   CO2 _0 --   GLUCOSE 113* 117* 99 109*  --   BUN 25* 26* 25* 27*  --   CREATININE 1.06 1.07 1.01 1.06  --   CALCIUM 8.7* 8.6* 8.6* 8.8*  --   MG  --   --  1.5*  --  2.0     CBC: Recent Labs  Lab  02/25/22 1932 02/26/22 0405 02/27/22 1002 02/28/22 0551  WBC 1.5*  1.7* 1.4*  1.4* 2.8* 2.5*  NEUTROABS 0.9* 0.9* 1.9 1.5*  HGB 11.1*  11.0* 9.1*  9.0* 8.4* 7.9*  HCT 33.4*  33.5* 26.2*  25.8* 24.5* 22.4*  MCV 99.1  98.8 94.2  93.8 95.3 95.3  PLT 231  217 193  169 107* 76*         SIGNED:   Debbe Odea, MD  [facility] 02/28/2022, 3:34 PM

## 2022-02-28 NOTE — Care Management (Signed)
  Transition of Care [facility]) Screening Note   Patient Details  Name: [name] Date of Birth: [birth year]   Transition of Care [facility]) CM/SW Contact:    Pete Pelt, RN Phone Number: 02/28/2022, 12:13 PM    Transition of Care Department [facility]) has reviewed patient and no TOC needs have been identified at this time. We will continue to monitor patient advancement through interdisciplinary progression rounds. If new patient transition needs arise, please place a TOC consult.    Note:  Patient has a walker and electric scooter at home, and states not home health needs.

## 2022-02-28 NOTE — Plan of Care (Signed)

## 2022-02-28 NOTE — Progress Notes (Signed)
PHARMACIST - PHYSICIAN COMMUNICATION  CONCERNING: Antibiotic IV to Oral Route Change Policy  RECOMMENDATION: This patient is receiving metronidazole by the intravenous route.  Based on criteria approved by the Pharmacy and Therapeutics Committee, the antibiotic(s) is/are being converted to the equivalent oral dose form(s).   DESCRIPTION: These criteria include: Patient being treated for a respiratory tract infection, urinary tract infection, cellulitis or clostridium difficile associated diarrhea if on metronidazole The patient is not neutropenic and does not exhibit a GI malabsorption state The patient is eating (either orally or via tube) and/or has been taking other orally administered medications for a least 24 hours The patient is improving clinically and has a Tmax < 100.5  If you have questions about this conversion, please contact the [place] 02/28/22

## 2022-02-28 NOTE — Progress Notes (Signed)
Pt dressed and ready for discharge; pt discharged via wheelchair by nursing to the ED, where his wife was currently a pt; got pt enrolled as a visitor to see his wife in ED25; left pt in ED25 with his wife in a wheelchair

## 2022-03-02 ENCOUNTER — Other Ambulatory Visit: Payer: Self-pay | Admitting: Physician Assistant

## 2022-03-02 ENCOUNTER — Other Ambulatory Visit: Payer: Self-pay | Admitting: Hematology and Oncology

## 2022-03-02 DIAGNOSIS — E876 Hypokalemia: Secondary | ICD-10-CM

## 2022-03-03 LAB — CULTURE, BLOOD (ROUTINE X 2)
Culture: NO GROWTH
Special Requests: ADEQUATE

## 2022-03-04 LAB — CULTURE, BLOOD (ROUTINE X 2): Special Requests: ADEQUATE

## 2022-03-05 ENCOUNTER — Other Ambulatory Visit: Payer: Medicare Other

## 2022-03-05 ENCOUNTER — Telehealth: Payer: Self-pay | Admitting: Dietician

## 2022-03-05 ENCOUNTER — Inpatient Hospital Stay: Payer: Medicare Other | Attending: Hematology and Oncology

## 2022-03-05 DIAGNOSIS — C3412 Malignant neoplasm of upper lobe, left bronchus or lung: Secondary | ICD-10-CM | POA: Insufficient documentation

## 2022-03-05 DIAGNOSIS — Z8546 Personal history of malignant neoplasm of prostate: Secondary | ICD-10-CM | POA: Insufficient documentation

## 2022-03-05 DIAGNOSIS — C9001 Multiple myeloma in remission: Secondary | ICD-10-CM | POA: Insufficient documentation

## 2022-03-05 DIAGNOSIS — Z87891 Personal history of nicotine dependence: Secondary | ICD-10-CM | POA: Insufficient documentation

## 2022-03-05 DIAGNOSIS — D701 Agranulocytosis secondary to cancer chemotherapy: Secondary | ICD-10-CM | POA: Insufficient documentation

## 2022-03-05 NOTE — Telephone Encounter (Signed)
Patient diagnosed  by IP RD  with Severe malnutrition in context of chronic illness. First attempt to reach. Spoke with patient who reports he is eating well since discharge but would prefer to talk with me next week. Provided my cell# on voice mail as contact.  Scheduled for remote nutrition assessment   April Manson, RDN, LDN Registered Dietitian, [place] Part Time Remote (Usual office hours: Tuesday-Thursday) Cell: [phone]

## 2022-03-06 NOTE — Progress Notes (Deleted)
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 The patient also has a history of prostate adenocarcinoma status post prostatectomy in 2003 and also history of multiple myeloma currently in remission diagnosed in 2012.   Molecular studies by foundation 1 showed no actionable mutations. ATM [id number]* , CDKN2A/B, NFE2L2  PRIOR THERAPY: None  CURRENT THERAPY: Systemic chemotherapy with carboplatin for AUC of 5, Alimta 500 Mg/M2 and Keytruda 200 Mg IV every 3 weeks.  First dose January 29, 2022.  Status post 2 cycle.  Starting from cycle #2, carboplatin dose reduced to an AUC of 4 due to pancytopenia. ***Further change?***  INTERVAL HISTORY: [name] 76 y.o. male returns to the clinic today for follow-up visit accompanied by his significant other.  The patient is currently undergoing treatment palliative systemic chemotherapy.  He did not tolerate cycle #1 well and had significant fatigue and weakness.  He also reported decreased appetite.  He previously declines any member the nutritionist team.  He also has some pancytopenia on labs.  Therefore, with cycle #2, the patient had a dose reduction of carboplatin to an AUC of 4.  The patient presented to the emergency room approximately 1 week after cycle #2 with a chief complaint of nausea, vomiting, and diarrhea.  He had a CT of the abdomen and pelvis for possible suspected gastroenteritis which was unrevealing.  He received antibiotics and Granix injections.  In the interval since his last cycle, level of thrombocytopenia with a platelet count of 76 K.  His hemoglobin got down to 7.9.  The patient's  white blood cell count got down to 1.4.  Today, the patient continues to have significant fatigue and weakness.  He reportedly sleeps most of the day.  Due to his severe malnutrition of chronic illness the registered dietitian was consulted.  The patient declined?  Weight loss?  The patient denies any fever, chills, or night sweats.  His breathing is "better".  He continues to have a mild cough.  Denies any chest pain or hemoptysis.  Denies any nausea, vomiting, diarrhea, or constipation since being discharged from the hospital?  Denies any headache or visual changes.  Denies any rashes or skin changes.  He is here today for evaluation and repeat blood work before starting cycle #3.     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   DDD (degenerative disc disease)    Fractures    compression T12,L3   GERD (gastroesophageal reflux disease)    History of chemotherapy    weekly Velcade,Cytoxan   HTN (hypertension)    Hypercalcemia of malignancy    Hyperlipemia    Multiple myeloma 07/09/2011   Multiple myeloma    Myeloma kidney (HCC)    Peripheral neuropathy    toes, sees Dr Krista Blue   Prostate cancer [facility]) 2008   s/p prostatectomy   Prostate cancer ([facility]) 03/2002   Renal insufficiency     ALLERGIES:  is allergic to  celebrex [celecoxib], cymbalta [duloxetine hcl], hydromorphone hcl, meloxicam, oxycontin [oxycodone], percocet [oxycodone-acetaminophen], vicodin [hydrocodone-acetaminophen], and lyrica [pregabalin].  MEDICATIONS:  Current Outpatient Medications  Medication Sig Dispense Refill   aspirin 81 MG tablet Take 81 mg by mouth daily.     B Complex Vitamins (VITAMIN-B COMPLEX) TABS Take 1 tablet by mouth daily.     Calcium Carbonate-Vitamin D 600-400  MG-UNIT per tablet Take 1 tablet by mouth daily.     cholecalciferol (VITAMIN D) 1000 UNITS tablet Take 1,000 Units by mouth daily.     fentaNYL (DURAGESIC) 75 MCG/HR Place 1 patch onto the skin every 3 (three) days.     folic acid (FOLVITE) 1 MG tablet Take 1 tablet (1 mg total) by mouth daily. 30 tablet 4   lidocaine-prilocaine (EMLA) cream Apply topically as needed. Apply to port site one hour before treatment and cover with plastic wrap 30 g 2   metroNIDAZOLE (FLAGYL) 500 MG tablet Take 1 tablet (500 mg total) by mouth every 12 (twelve) hours. 5 tablet 0   mirtazapine (REMERON) 15 MG tablet Take 15 mg by mouth at bedtime.     NONFORMULARY OR COMPOUNDED ITEM Baclofn/amitrp/ketamn cream topically to feet TID prn for neuropathy     NUBEQA 300 MG tablet Take 600 mg by mouth 2 (two) times daily.     ondansetron (ZOFRAN) 4 MG tablet Take 4 mg by mouth every 8 (eight) hours as needed.     Oxycodone HCl 10 MG TABS Take 1-2 tablets by mouth 3 (three) times daily as needed.     pantoprazole (PROTONIX) 40 MG tablet TAKE 1 TABLET BY MOUTH EVERY DAY 90 tablet 3   potassium chloride SA (KLOR-CON M) 20 MEQ tablet Take 1 tablet (20 mEq total) by mouth daily. 6 tablet 0   prochlorperazine (COMPAZINE) 10 MG tablet Take 1 tablet (10 mg total) by mouth every 6 (six) hours as needed for nausea or vomiting. 30 tablet 0   No current facility-administered medications for this visit.    SURGICAL HISTORY:  Past Surgical History:  Procedure Laterality Date   BASAL CELL CARCINOMA EXCISION Left 10/22/2020   [facility] (Dr. Leonie Man)   CATARACT EXTRACTION W/ INTRAOCULAR LENS IMPLANT Bilateral    DEXA  05/2011   spine 0.7, L femur 0.0, R femur -0.6, normal   KIDNEY STONE SURGERY     KIDNEY STONE SURGERY  06/2009   Stone removed from bladder   MELANOMA EXCISION Left 06/05/2019   Procedure: REEXCISION MELANOMA OF LEFT POSTERIOR AURICLE;  Surgeon: Johnathan Hausen, MD;  Location: [facility];  Service:  General;  Laterality: Left;   PORTACATH PLACEMENT  08/05/11   right sided portacath   PROSTATECTOMY  04/06/07   TOE AMPUTATION Left 2017   2nd toe left foot    REVIEW OF SYSTEMS:   Review of Systems  Constitutional: Negative for appetite change, chills, fatigue, fever and unexpected weight change.  HENT:   Negative for mouth sores, nosebleeds, sore throat and trouble swallowing.   Eyes: Negative for eye problems and icterus.  Respiratory: Negative for cough, hemoptysis, shortness of breath and wheezing.   Cardiovascular: Negative for chest pain and leg swelling.  Gastrointestinal: Negative for abdominal pain, constipation, diarrhea, nausea and vomiting.  Genitourinary: Negative for bladder incontinence, difficulty urinating, dysuria, frequency and hematuria.   Musculoskeletal: Negative for back pain, gait problem, neck pain and neck stiffness.  Skin: Negative for itching and rash.  Neurological: Negative for dizziness, extremity weakness, gait problem, headaches, light-headedness  and seizures.  Hematological: Negative for adenopathy. Does not bruise/bleed easily.  Psychiatric/Behavioral: Negative for confusion, depression and sleep disturbance. The patient is not nervous/anxious.     PHYSICAL EXAMINATION:  There were no vitals taken for this visit.  ECOG PERFORMANCE STATUS: {CHL ONC ECOG [id number]  Physical Exam  Constitutional: Oriented to person, place, and time and well-developed, well-nourished, and in no distress. No distress.  HENT:  Head: Normocephalic and atraumatic.  Mouth/Throat: Oropharynx is clear and moist. No oropharyngeal exudate.  Eyes: Conjunctivae are normal. Right eye exhibits no discharge. Left eye exhibits no discharge. No scleral icterus.  Neck: Normal range of motion. Neck supple.  Cardiovascular: Normal rate, regular rhythm, normal heart sounds and intact distal pulses.   Pulmonary/Chest: Effort normal and breath sounds normal. No respiratory distress.  No wheezes. No rales.  Abdominal: Soft. Bowel sounds are normal. Exhibits no distension and no mass. There is no tenderness.  Musculoskeletal: Normal range of motion. Exhibits no edema.  Lymphadenopathy:    No cervical adenopathy.  Neurological: Alert and oriented to person, place, and time. Exhibits normal muscle tone. Gait normal. Coordination normal.  Skin: Skin is warm and dry. No rash noted. Not diaphoretic. No erythema. No pallor.  Psychiatric: Mood, memory and judgment normal.  Vitals reviewed.  LABORATORY DATA: Lab Results  Component Value Date   WBC 2.5 (L) 02/28/2022   HGB 7.9 (L) 02/28/2022   HCT 22.4 (L) 02/28/2022   MCV 95.3 02/28/2022   PLT 76 (L) 02/28/2022      Chemistry      Component Value Date/Time   NA 139 02/27/2022 1420   NA 139 05/14/2017 1145   K 3.8 02/27/2022 1420   K 3.8 05/14/2017 1145   CL 107 02/27/2022 1420   CL 108 (H) 09/30/2013 0945   CL 110 (H) 12/29/2012 1151   CO2 24 02/27/2022 1420   CO2 27 05/14/2017 1145   BUN 27 (H) 02/27/2022 1420   BUN 17.9 05/14/2017 1145   CREATININE 1.06 02/27/2022 1420   CREATININE 1.10 02/19/2022 0800   CREATININE 1.3 05/14/2017 1145      Component Value Date/Time   CALCIUM 8.8 (L) 02/27/2022 1420   CALCIUM 9.3 05/14/2017 1145   ALKPHOS 42 02/25/2022 1932   ALKPHOS 45 05/14/2017 1145   AST 29 02/25/2022 1932   AST 23 02/19/2022 0800   AST 19 05/14/2017 1145   ALT 17 02/25/2022 1932   ALT 13 02/19/2022 0800   ALT 18 05/14/2017 1145   BILITOT 1.0 02/25/2022 1932   BILITOT 0.4 02/19/2022 0800   BILITOT 0.67 05/14/2017 1145       RADIOGRAPHIC STUDIES:  CT ABDOMEN PELVIS WO CONTRAST  Result Date: 02/26/2022 CLINICAL DATA:  Nausea, vomiting.  History of prostate cancer. EXAM: CT ABDOMEN AND PELVIS WITHOUT CONTRAST TECHNIQUE: Multidetector CT imaging of the abdomen and pelvis was performed following the standard protocol without IV contrast. RADIATION DOSE REDUCTION: This exam was performed according  to the departmental dose-optimization program which includes automated exposure control, adjustment of the mA and/or kV according to patient size and/or use of iterative reconstruction technique. COMPARISON:  January 17, 2020. FINDINGS: Lower chest: Small left pleural effusion is noted with adjacent subsegmental atelectasis of the left lower lobe. Hepatobiliary: No focal liver abnormality is seen. Mild cholelithiasis is noted without biliary dilatation. Pancreas: Unremarkable. No pancreatic ductal dilatation or surrounding inflammatory changes. Spleen: Normal in size without focal abnormality. Adrenals/Urinary Tract: Adrenal glands appear normal. Bilateral nonobstructive nephrolithiasis is noted. No hydronephrosis  or renal obstruction is noted. Urinary bladder is unremarkable. Stable left renal cyst is noted for which no further follow-up is required. Stomach/Bowel: Stomach is within normal limits. Appendix appears normal. No evidence of bowel wall thickening, distention, or inflammatory changes. Vascular/Lymphatic: Aortic atherosclerosis. No enlarged abdominal or pelvic lymph nodes. Reproductive: Status post prostatectomy. Other: No abdominal wall hernia or abnormality. No abdominopelvic ascites. Musculoskeletal: Status post kyphoplasty at T12, L2, L3 and L4. Stable old right parasymphyseal fracture is noted. No definite acute osseous abnormality is noted. IMPRESSION: Small left pleural effusion is noted with adjacent subsegmental atelectasis of the left lower lobe. Mild cholelithiasis without inflammation. Bilateral nonobstructive nephrolithiasis. Status post prostatectomy. Aortic Atherosclerosis (ICD10-I70.0). Electronically Signed   By: Marijo Conception M.D.   On: 02/26/2022 08:51   DG Chest Port 1 View  Result Date: 02/26/2022 CLINICAL DATA:  Questionable sepsis - evaluate for abnormality Nausea, vomiting and diarrhea. History of lung cancer, active chemotherapy. EXAM: PORTABLE CHEST 1 VIEW COMPARISON:   Radiograph 12/18/2021 FINDINGS: Left chest port remains in place. Decreased left pleural effusion with persistent pleuroparenchymal opacity at the left lung base. Mild diffuse increased density in the left hemithorax may represent layering effusion or airspace disease. This is similar to prior. Known left lung mass is not well seen by radiograph. Mild atelectasis at the right lung base. Stable heart size and mediastinal contours. No pneumothorax. IMPRESSION: 1. Decreased left pleural effusion from prior exam. Persistent pleuroparenchymal opacity at the left lung base. 2. Mild diffuse increased density in the left hemithorax may represent layering effusion or airspace disease, unchanged. Electronically Signed   By: Keith Rake M.D.   On: 02/26/2022 00:04     ASSESSMENT/PLAN:  This is a very pleasant 76 year old Caucasian male diagnosed with stage IV (T1c, N2, M1A) non-small cell lung cancer, adenocarcinoma.  The patient presented with a left lingular nodule in addition to pleural nodularity and thickening throughout the left hemithorax as well as a malignant left pleural effusion.  The patient also has pericardiac lymphadenopathy.  He was diagnosed in May 2023.  His molecular studies by foundation 1 showed no actionable mutations.  The patient also has a history of prostate adenocarcinoma.  He is status post prostatectomy in 2003.  He also has a history of multiple myeloma and is currently in remission.  He was diagnosed in 2012.  The patient is currently undergoing palliative systemic chemotherapy with carboplatin for an AUC of 5, Alimta 500 mg per metered squared, and Keytruda 200 mg IV every 3 weeks.  The patient is status post 2 cycles.  He had pancytopenia and significant fatigue and weakness with cycle #1.  His first dose of treatment was on 01/29/2022.  He also has significant fatigue, weakness, nausea, vomiting, and diarrhea following cycle #2  The patient was seen with Dr. Julien Nordmann today.  Dr.  Julien Nordmann had a lengthy discussion with the patient today about his current condition and plan moving forward.  Further dose reductions can?  Remove carboplatin?  Palliative care and hospice?  Arrange for CT scan?  Need to sign ***  We will see him back for follow-up visit in 3 weeks for evaluation and repeat blood work before starting cycle #4.  Weight loss and nutrition?  Previously given Medrol Dosepak for fatigue and decreased appetite.  Referral to registered dietitian?   The patient was advised to call immediately if she has any concerning symptoms in the interval. The patient voices understanding of current disease status and treatment options and  is in agreement with the current care plan. All questions were answered. The patient knows to call the clinic with any problems, questions or concerns. We can certainly see the patient much sooner if necessary             No orders of the defined types were placed in this encounter.    I spent {CHL ONC TIME VISIT - IFBPP:[id number]} counseling the patient face to face. The total time spent in the appointment was {CHL ONC TIME VISIT - LKHVF:[id number]}.  Jakerria Kingbird L Sofya Moustafa, PA-C 03/06/22

## 2022-03-08 ENCOUNTER — Other Ambulatory Visit: Payer: Self-pay | Admitting: Hematology and Oncology

## 2022-03-09 ENCOUNTER — Telehealth: Payer: Self-pay | Admitting: Medical Oncology

## 2022-03-09 ENCOUNTER — Encounter: Payer: Self-pay | Admitting: Internal Medicine

## 2022-03-09 NOTE — Telephone Encounter (Addendum)
Referral I lvm at Dr Steve Rattler office that Dr Julien Nordmann is referring pt to Dr Rogue Bussing in [place]. Pt wants all future followup to be there.

## 2022-03-09 NOTE — Telephone Encounter (Signed)
He is aware. He said he will ask MD in [place].

## 2022-03-09 NOTE — Telephone Encounter (Signed)
Referral requested to Dr Charlaine Dalton, "oncologist" in [place].    More convenient for pt .  Cancel all future appts.

## 2022-03-11 ENCOUNTER — Ambulatory Visit: Payer: Medicare Other | Admitting: Dietician

## 2022-03-11 NOTE — Progress Notes (Signed)
Nutrition Follow Up: Reached out to patient at home telephone number.    ASSESSMENT: Patient assessed by IP RD with severe malnutrition.  Patient reports his appetite is improving since his hospitalization.Reports had some nausea this morning after eating a cupcake, "cupcake too dry," he relayed that PTA lots of nausea.  He thinks he has anti nausea meds but he isn't taking any at this time. He also relays has some taste changes especially with fruits "tastes weird, but I eat them because they are good for me." Bowels are back to normal, has bowel movement every other day.  Friends and neighbors are bringing food over.   Usual Intake: Breakfast Bowl oatmeal made with water with fruit Lunch Chik filet, tomatoes, green beans Dinner Meatloaf and BBQ beans, potato salad Smoked pork, mac & cheese Watermelon cantaloupe  Snacks throughout day, cookies, potato chips, strawberry shortcake  Fluid:  milk in morning, OJ 2-3 times, water, drinks ONS from [employer] 3x week. He doesn't know brand but reports it is a chocolate peanut butter flavor.   He was getting a call from [facility] so tried to rush me off the call.  I explained that I would be checking in on him remotely and if he can maintain and improved nutritional status I would continue to follow, or transfer care to RD at [facility].    Nutrition Focused Physical Exam (performed by IP RD 02/26/22):   Flowsheet Row Most Recent Value  Orbital Region Severe depletion  Upper Arm Region Severe depletion  Thoracic and Lumbar Region Severe depletion  Buccal Region Severe depletion  Temple Region Severe depletion  Clavicle Bone Region Severe depletion  Clavicle and Acromion Bone Region Severe depletion  Scapular Bone Region Severe depletion  Dorsal Hand Severe depletion  Patellar Region Severe depletion  Anterior Thigh Region Severe depletion  Posterior Calf Region Severe depletion  Edema (RD Assessment) None  Hair Reviewed  Eyes Reviewed  Mouth  Reviewed  Skin Reviewed  Nails Reviewed    Medications: Not currently taking anti nausea meds, appetite stimulant or any vitamins or supplements.   Labs: 02/28/22  Hgb 7.9 (falling)   Anthropometrics: Lost 33# (17%) past 2 months  Height: 74" Weight:  02/25/22  160# 02/05/22  171# 01/19/22  179# 01/05/22   193#  UBW: 185-188 BMI: 20.54   Estimated Energy Needs  Kcals: 2200-2500 Protein: 88-110 Fluid: 2.5 L  NUTRITION DIAGNOSIS (per IP RD):    Severe Malnutrition related to chronic illness (lung cancer) as evidenced by percent weight loss, severe fat depletion, severe muscle depletion. Continues a   INTERVENTION:   Educated on importance of adequate calorie and protein energy intake  with nutrient dense foods when possible to maintain weight/strength Encouraged high protein foods as well as small frequent meals/snacks -  Suggested he increase his oral nutrition supplement to once daily. Emailed Nutrition Tip sheet  for  High protein High Calorie snacking and recipe for Chocolate Peanut Butter Pudding, and  Nutrition During Cancer Treatment to [employer]. Contact information provided  MONITORING, EVALUATION, GOAL: weight trends, nutrition impact symptoms, PO intake, labs  Next Visit: next week after MD visit  April Manson, RDN, LDN Registered Dietitian, [place] (Usual office hours: Tuesday-Thursday) Mobile: [phone] Remote Office: [phone]

## 2022-03-12 ENCOUNTER — Ambulatory Visit: Payer: Medicare Other

## 2022-03-12 ENCOUNTER — Other Ambulatory Visit: Payer: Medicare Other

## 2022-03-12 ENCOUNTER — Ambulatory Visit: Payer: Medicare Other | Admitting: Physician Assistant

## 2022-03-17 ENCOUNTER — Inpatient Hospital Stay (HOSPITAL_BASED_OUTPATIENT_CLINIC_OR_DEPARTMENT_OTHER): Payer: Medicare Other | Admitting: Internal Medicine

## 2022-03-17 ENCOUNTER — Encounter: Payer: Self-pay | Admitting: Internal Medicine

## 2022-03-17 ENCOUNTER — Inpatient Hospital Stay: Payer: Medicare Other

## 2022-03-17 ENCOUNTER — Encounter: Payer: Self-pay | Admitting: *Deleted

## 2022-03-17 VITALS — BP 101/66 | HR 95 | Temp 97.9°F | Ht 74.0 in | Wt 163.7 lb

## 2022-03-17 DIAGNOSIS — Z87891 Personal history of nicotine dependence: Secondary | ICD-10-CM | POA: Diagnosis not present

## 2022-03-17 DIAGNOSIS — C61 Malignant neoplasm of prostate: Secondary | ICD-10-CM

## 2022-03-17 DIAGNOSIS — C9001 Multiple myeloma in remission: Secondary | ICD-10-CM | POA: Diagnosis not present

## 2022-03-17 DIAGNOSIS — C9002 Multiple myeloma in relapse: Secondary | ICD-10-CM | POA: Diagnosis not present

## 2022-03-17 DIAGNOSIS — Z8546 Personal history of malignant neoplasm of prostate: Secondary | ICD-10-CM | POA: Diagnosis not present

## 2022-03-17 DIAGNOSIS — C3412 Malignant neoplasm of upper lobe, left bronchus or lung: Secondary | ICD-10-CM | POA: Diagnosis not present

## 2022-03-17 DIAGNOSIS — D701 Agranulocytosis secondary to cancer chemotherapy: Secondary | ICD-10-CM | POA: Diagnosis not present

## 2022-03-17 LAB — COMPREHENSIVE METABOLIC PANEL
ALT: 13 U/L (ref 0–44)
AST: 25 U/L (ref 15–41)
Albumin: 3 g/dL — ABNORMAL LOW (ref 3.5–5.0)
Alkaline Phosphatase: 45 U/L (ref 38–126)
Anion gap: 8 (ref 5–15)
BUN: 16 mg/dL (ref 8–23)
CO2: 28 mmol/L (ref 22–32)
Calcium: 8.7 mg/dL — ABNORMAL LOW (ref 8.9–10.3)
Chloride: 100 mmol/L (ref 98–111)
Creatinine, Ser: 1.11 mg/dL (ref 0.61–1.24)
GFR, Estimated: >60 mL/min (ref >60)
Glucose, Bld: 133 mg/dL — ABNORMAL HIGH (ref 70–99)
Potassium: 3 mmol/L — ABNORMAL LOW (ref 3.5–5.1)
Sodium: 136 mmol/L (ref 135–145)
Total Bilirubin: 0.5 mg/dL (ref 0.3–1.2)
Total Protein: 6.6 g/dL (ref 6.5–8.1)

## 2022-03-17 LAB — CBC WITH DIFFERENTIAL/PLATELET
Abs Immature Granulocytes: 0.05 10*3/uL (ref 0.00–0.07)
Basophils Absolute: 0 10*3/uL (ref 0.0–0.1)
Basophils Relative: 1 %
Eosinophils Absolute: 0 10*3/uL (ref 0.0–0.5)
Eosinophils Relative: 1 %
HCT: 24.8 % — ABNORMAL LOW (ref 39.0–52.0)
Hemoglobin: 8.5 g/dL — ABNORMAL LOW (ref 13.0–17.0)
Immature Granulocytes: 1 %
Lymphocytes Relative: 15 %
Lymphs Abs: 0.9 10*3/uL (ref 0.7–4.0)
MCH: 34.4 pg — ABNORMAL HIGH (ref 26.0–34.0)
MCHC: 34.3 g/dL (ref 30.0–36.0)
MCV: 100.4 fL — ABNORMAL HIGH (ref 80.0–100.0)
Monocytes Absolute: 1.5 10*3/uL — ABNORMAL HIGH (ref 0.1–1.0)
Monocytes Relative: 25 %
Neutro Abs: 3.3 10*3/uL (ref 1.7–7.7)
Neutrophils Relative %: 57 %
Platelets: 281 10*3/uL (ref 150–400)
RBC: 2.47 MIL/uL — ABNORMAL LOW (ref 4.22–5.81)
RDW: 17.6 % — ABNORMAL HIGH (ref 11.5–15.5)
WBC: 5.8 10*3/uL (ref 4.0–10.5)
nRBC: 0.3 % — ABNORMAL HIGH (ref 0.0–0.2)

## 2022-03-17 LAB — IRON AND TIBC
Iron: 132 ug/dL (ref 45–182)
Saturation Ratios: 87 % — ABNORMAL HIGH (ref 17.9–39.5)
TIBC: 151 ug/dL — ABNORMAL LOW (ref 250–450)
UIBC: 19 ug/dL

## 2022-03-17 LAB — PSA: Prostatic Specific Antigen: 11.96 ng/mL — ABNORMAL HIGH (ref 0.00–4.00)

## 2022-03-17 LAB — FERRITIN: Ferritin: 1603 ng/mL — ABNORMAL HIGH (ref 24–336)

## 2022-03-17 LAB — LACTATE DEHYDROGENASE: LDH: 187 U/L (ref 98–192)

## 2022-03-17 MED ORDER — PANTOPRAZOLE SODIUM 40 MG PO TBEC: 40.0000 mg | DELAYED_RELEASE_TABLET | Freq: Every day | ORAL | 3 refills | Status: DC

## 2022-03-17 NOTE — Progress Notes (Signed)
[place] NOTE  Patient Care Team: Jinny Sanders, MD as PCP - General Jeanann Lewandowsky, MD as Consulting Physician (Hematology and Oncology) Cathlean Marseilles, NP as Nurse Practitioner (Hematology and Oncology) Raynelle Bring, MD as Consulting Physician (Urology) Garrel Ridgel, [place] as Consulting Physician (Podiatry) Morton Stall, Howell Rucks, NP as Nurse Practitioner (Pain Medicine) Druscilla Brownie, MD as Consulting Physician (Dermatology) Bryson Ha, OD as Consulting Physician (Optometry) Nicholas Lose, MD as Consulting Physician (Hematology and Oncology) Telford Nab, RN as Oncology Nurse Navigator  CHIEF COMPLAINTS/PURPOSE OF CONSULTATION: Lung cancer  Oncology History Overview Note  Adenosquamous lung cancer  # MULTIPLE MYELOMA [Dr.Gorsuch]; s/p Auto-Transplant [[facility]]- remission.   # MAY 2023-   # The patient is currently undergoing systemic chemotherapy with carboplatin for AUC of 5, Alimta 500 Mg/M2 and Keytruda 200 Mg IV every 3 weeks status post 1 cycle started on January 29, 2022. [Dr.Mohammed; [place]]  #Prostate cancer prostatectomy 2003; multiple myeloma- 2012-remission   Malignant neoplasm of prostate ([place])  02/23/2007 Initial Diagnosis   Malignant neoplasm of prostate ([place])   10/28/2020 Cancer Staging   Staging form: Prostate, AJCC 6th Edition - Clinical: Stage III (T3, N0, M0) - Signed by Heath Lark, MD on 10/28/2020   11/24/2021 Procedure   Successful ultrasound guided diagnostic and therapeutic left thoracentesis yielding 750 cc of pleural fluid.     12/02/2021 PET scan   1. Hypermetabolic nodular areas of consolidation in the left lung with a hypermetabolic malignant left pleural effusion. Hypermetabolism extends inferiorly along the left hemidiaphragm. Associated hypermetabolic prepericardiac lymph nodes. Findings may be metastatic. Primary bronchogenic carcinoma cannot be excluded. 2. Uptake in the soft tissues of the distal left foot with  associated edema in the soft tissues. Please correlate clinically. 3. Cholelithiasis and choledocholithiasis. No biliary ductal dilatation. 4. Bilateral renal stones. 5. Aortic atherosclerosis (ICD10-I70.0). Coronary artery calcification.     12/18/2021 Procedure   Successful CT guided core needle core biopsy of LEFT lung mass, as above   Multiple myeloma in relapse ([place])  07/09/2011 Initial Diagnosis   Multiple myeloma in relapse ([place])   01/28/2022 - 02/19/2022 Chemotherapy   Patient is on Treatment Plan : LUNG Carboplatin (5) + Pemetrexed (500) + Pembrolizumab (200) D1 q21d Induction x 4 cycles / Maintenance Pemetrexed (500) + Pembrolizumab (200) D1 q21d     01/28/2022 -  Chemotherapy   Patient is on Treatment Plan : LUNG Carboplatin (5) + Pemetrexed (500) + Pembrolizumab (200) D1 q21d Induction x 4 cycles / Maintenance Pemetrexed (500) + Pembrolizumab (200) D1 q21d     Lung cancer ([place])  12/02/2021 PET scan   1. Hypermetabolic nodular areas of consolidation in the left lung with a hypermetabolic malignant left pleural effusion. Hypermetabolism extends inferiorly along the left hemidiaphragm. Associated hypermetabolic prepericardiac lymph nodes. Findings may be metastatic. Primary bronchogenic carcinoma cannot be excluded. 2. Uptake in the soft tissues of the distal left foot with associated edema in the soft tissues. Please correlate clinically. 3. Cholelithiasis and choledocholithiasis. No biliary ductal dilatation. 4. Bilateral renal stones. 5. Aortic atherosclerosis (ICD10-I70.0). Coronary artery calcification.   12/18/2021 Pathology Results   A. LUNG MASS, LEFT, BIOPSY:  -  Adenocarcinoma  -  See comment  Six immunohistochemical stains were performed with adequate control. The adenocarcinoma is diffusely and strongly positive for cytokeratin 7 and the pulmonary adeno marker TTF-1.  The tumor is negative for the pulmonary adeno marker Napsin A.  The tumor does show a patchy focal  positivity  for the squamous marker p40 suggesting an adenosquamous differentiation to this tumor.  The tumor is negative for the prostate markers prostate-specific antigen and prostein.    12/23/2021 Initial Diagnosis   Lung cancer ([place])   12/23/2021 Cancer Staging   Staging form: Lung, AJCC 8th Edition - Clinical stage from 12/23/2021: Stage IVA (cT1c, cN2, cM1a) - Signed by Curt Bears, MD on 01/05/2022 Stage prefix: Initial diagnosis   01/01/2022 Imaging   MRI brain Negative for metastatic disease.   01/28/2022 - 02/19/2022 Chemotherapy   Patient is on Treatment Plan : LUNG Carboplatin (5) + Pemetrexed (500) + Pembrolizumab (200) D1 q21d Induction x 4 cycles / Maintenance Pemetrexed (500) + Pembrolizumab (200) D1 q21d     01/28/2022 -  Chemotherapy   Patient is on Treatment Plan : LUNG Carboplatin (5) + Pemetrexed (500) + Pembrolizumab (200) D1 q21d Induction x 4 cycles / Maintenance Pemetrexed (500) + Pembrolizumab (200) D1 q21d     Malignant neoplasm of upper lobe of left lung ([place])  01/28/2022 -  Chemotherapy   Patient is on Treatment Plan : LUNG Carboplatin (5) + Pemetrexed (500) + Pembrolizumab (200) D1 q21d Induction x 4 cycles / Maintenance Pemetrexed (500) + Pembrolizumab (200) D1 q21d     03/17/2022 Initial Diagnosis   Malignant neoplasm of upper lobe of left lung ([place])   03/17/2022 Cancer Staging   Staging form: Lung, AJCC 8th Edition - Clinical: Stage IVB (cT2, cN2, cM1c) - Signed by Cammie Sickle, MD on 03/17/2022     HISTORY OF PRESENTING ILLNESS: Patient in wheelchair.  Accompanied by his friend.   [name] 76 y.o.  male with multiple medical problems-including history of multiple myeloma in remission; Dx in May 2023- stage IV lung cancer adeno-squamous carcinoma currently palliative immunotherapy.    Patient was recently admitted to hospital after cycle #2-with neutropenia/nausea vomiting-attributable to his chemotherapy.  Patient's symptoms improved without  any steroids.  Patient brought factor support.   Given the proximity/logistics involved-patient wants his care locally at [facility].   Review of Systems  Constitutional:  Positive for malaise/fatigue and weight loss. Negative for chills, diaphoresis and fever.  HENT:  Negative for nosebleeds and sore throat.   Eyes:  Negative for double vision.  Respiratory:  Negative for cough, hemoptysis, sputum production, shortness of breath and wheezing.   Cardiovascular:  Negative for chest pain, palpitations, orthopnea and leg swelling.  Gastrointestinal:  Negative for abdominal pain, blood in stool, constipation, diarrhea, heartburn, melena, nausea and vomiting.  Genitourinary:  Negative for dysuria, frequency and urgency.  Musculoskeletal:  Positive for back pain and joint pain.  Skin: Negative.  Negative for itching and rash.  Neurological:  Positive for weakness. Negative for dizziness, tingling, focal weakness and headaches.  Endo/Heme/Allergies:  Does not bruise/bleed easily.  Psychiatric/Behavioral:  Negative for depression. The patient is not nervous/anxious and does not have insomnia.     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 Leonie Man)   CATARACT EXTRACTION W/ INTRAOCULAR LENS IMPLANT Bilateral    DEXA  05/2011   spine 0.7, L femur 0.0, R femur -0.6, normal   KIDNEY STONE SURGERY     KIDNEY STONE SURGERY  06/2009   Stone removed from bladder   MELANOMA EXCISION Left 06/05/2019   Procedure: REEXCISION MELANOMA OF LEFT POSTERIOR AURICLE;  Surgeon: Johnathan Hausen, MD;  Location: [facility];  Service: General;  Laterality: Left;   PORTACATH PLACEMENT  08/05/11   right sided portacath   PROSTATECTOMY  04/06/07   TOE AMPUTATION Left 2017   2nd toe left foot    SOCIAL HISTORY: Social History   Socioeconomic History   Marital status: Single    Spouse name: Not on file   Number of children: Not on file   Years of education: Not on file   Highest education level: Not on file  Occupational History   Occupation: Sales    Comment: [employer] [place] [employer]  Tobacco Use   Smoking status: Former    Packs/day: 2.00    Years: 30.00    Total pack years: 60.00    Types: Cigarettes    Quit date: 07/27/1996    Years since quitting: 25.6   Smokeless tobacco: Never  Vaping Use   Vaping Use: Never used  Substance and Sexual Activity   Alcohol use: No   Drug use: No   Sexual activity: Never  Other Topics Concern   Not on file  Social History Narrative   [place]- closer to [place]; condos; [wife with stroke- better]. Quit smoking 1998; no alcohol.    Social Determinants of Health   Financial Resource Strain: Not on file  Food Insecurity: Not on file  Transportation Needs: Not on file  Physical Activity: Not on file  Stress: Not on file  Social Connections: Not on file  Intimate Partner Violence: Not on file    FAMILY HISTORY: Family History  Problem Relation Age of Onset   Pneumonia Mother    Stroke Father    Cancer Brother        lung   Kidney cancer Sister    Colon cancer Neg Hx     ALLERGIES:   is allergic to celebrex [celecoxib], cymbalta [duloxetine hcl], hydromorphone hcl, meloxicam, oxycontin [oxycodone], percocet [oxycodone-acetaminophen], vicodin [hydrocodone-acetaminophen], and lyrica [pregabalin].  MEDICATIONS:  Current Outpatient Medications  Medication Sig Dispense Refill   Oxycodone HCl 10 MG TABS Take 1-2 tablets by mouth 3 (three) times daily as needed.     aspirin 81 MG tablet Take 81 mg by mouth daily. (Patient not taking: Reported on 03/17/2022)     B Complex Vitamins (VITAMIN-B COMPLEX) TABS Take 1 tablet by mouth daily. (Patient not taking: Reported on 03/17/2022)     Calcium Carbonate-Vitamin D 600-400 MG-UNIT per tablet Take 1 tablet by mouth daily. (Patient not taking: Reported on 03/17/2022)     cholecalciferol (VITAMIN D) 1000 UNITS tablet Take 1,000 Units by mouth daily. (Patient not taking: Reported on 03/17/2022)     fentaNYL (DURAGESIC) 75 MCG/HR Place 1 patch onto the  skin every 3 (three) days. (Patient not taking: Reported on 2/70/6237)     folic acid (FOLVITE) 1 MG tablet Take 1 tablet (1 mg total) by mouth daily. (Patient not taking: Reported on 03/17/2022) 30 tablet 4   lidocaine-prilocaine (EMLA) cream Apply topically as needed. Apply to port site one hour before treatment and cover with plastic wrap (Patient not taking: Reported on 03/17/2022) 30 g 2   metroNIDAZOLE (FLAGYL) 500 MG tablet Take 1 tablet (500 mg total) by mouth every 12 (twelve) hours. (Patient not taking: Reported on 03/17/2022) 5 tablet 0   mirtazapine (REMERON) 15 MG tablet Take 15 mg by mouth at bedtime. (Patient not taking: Reported on 03/17/2022)     NONFORMULARY OR COMPOUNDED ITEM Baclofn/amitrp/ketamn cream topically to feet TID prn for neuropathy (Patient not taking: Reported on 03/17/2022)     NUBEQA 300 MG tablet Take 600 mg by mouth 2 (two) times daily. (Patient not taking: Reported on 03/17/2022)     ondansetron (ZOFRAN) 4 MG tablet Take 4 mg by mouth every 8 (eight) hours as needed.  (Patient not taking: Reported on 03/17/2022)     pantoprazole (PROTONIX) 40 MG tablet Take 1 tablet (40 mg total) by mouth daily. 90 tablet 3   potassium chloride SA (KLOR-CON M) 20 MEQ tablet Take 1 tablet (20 mEq total) by mouth daily. (Patient not taking: Reported on 03/17/2022) 6 tablet 0   prochlorperazine (COMPAZINE) 10 MG tablet Take 1 tablet (10 mg total) by mouth every 6 (six) hours as needed for nausea or vomiting. (Patient not taking: Reported on 03/17/2022) 30 tablet 0   No current facility-administered medications for this visit.    PHYSICAL EXAMINATION:  Vitals:   03/17/22 1333  BP: 101/66  Pulse: 95  Temp: 97.9 F (36.6 C)  SpO2: 97%   Filed Weights   03/17/22 1333  Weight: 163 lb 11.2 oz (74.3 kg)    Physical Exam Vitals and nursing note reviewed.  HENT:     Head: Normocephalic and atraumatic.     Mouth/Throat:     Pharynx: Oropharynx is clear.  Eyes:     Extraocular Movements: Extraocular movements intact.     Pupils: Pupils are equal, round, and reactive to light.  Cardiovascular:     Rate and Rhythm: Normal rate and regular rhythm.  Pulmonary:     Comments: Decreased breath sounds bilaterally.  Abdominal:     Palpations: Abdomen is soft.  Musculoskeletal:        General: Normal range of motion.     Cervical back: Normal range of motion.  Skin:    General: Skin is warm.  Neurological:     General: No focal deficit present.     Mental Status: He is alert and oriented to person, place, and time.  Psychiatric:        Behavior: Behavior normal.        Judgment: Judgment normal.    LABORATORY DATA:  I have reviewed the data as listed Lab Results  Component Value Date   WBC 5.8 03/17/2022   HGB 8.5 (L) 03/17/2022   HCT 24.8 (L) 03/17/2022   MCV 100.4 (H) 03/17/2022   PLT 281 03/17/2022   Recent Labs    02/19/22 0800 02/25/22 1932 02/26/22 0405 02/27/22 1002 02/27/22 1420 03/17/22 1427  NA 136 135   < > 139 139 136  K 3.2* 4.3   < > 3.4*  3.8 3.0*  CL 98 103   < > 107 107 100  CO2 29 23   < > '25 24 28  ' GLUCOSE 107* 113*   < > 99 109* 133*  BUN 15 25*   < > 25* 27* 16  CREATININE 1.10 1.06   < > 1.01 1.06 1.11  CALCIUM 8.9 8.7*   < > 8.6* 8.8* 8.7*  GFRNONAA >60 >60   < > >60 >60 >60  PROT 6.7 7.3  --   --   --  6.6  ALBUMIN 3.4* 3.5  --   --   --  3.0*  AST 23 29  --   --   --  25  ALT 13 17  --   --   --  13  ALKPHOS 44 42  --   --   --  45  BILITOT 0.4 1.0  --   --   --  0.5   < > = values in this interval not displayed.    RADIOGRAPHIC STUDIES: I have personally reviewed the radiological images as listed and agreed with the findings in the report. CT ABDOMEN PELVIS WO CONTRAST  Result Date: 02/26/2022 CLINICAL DATA:  Nausea, vomiting.  History of prostate cancer. EXAM: CT ABDOMEN AND PELVIS WITHOUT CONTRAST TECHNIQUE: Multidetector CT imaging of the abdomen and pelvis was performed following the standard protocol without IV contrast. RADIATION DOSE REDUCTION: This exam was performed according to the departmental dose-optimization program which includes automated exposure control, adjustment of the mA and/or kV according to patient size and/or use of iterative reconstruction technique. COMPARISON:  January 17, 2020. FINDINGS: Lower chest: Small left pleural effusion is noted with adjacent subsegmental atelectasis of the left lower lobe. Hepatobiliary: No focal liver abnormality is seen. Mild cholelithiasis is noted without biliary dilatation. Pancreas: Unremarkable. No pancreatic ductal dilatation or surrounding inflammatory changes. Spleen: Normal in size without focal abnormality. Adrenals/Urinary Tract: Adrenal glands appear normal. Bilateral nonobstructive nephrolithiasis is noted. No hydronephrosis or renal obstruction is noted. Urinary bladder is unremarkable. Stable left renal cyst is noted for which no further follow-up is required. Stomach/Bowel: Stomach is within normal limits. Appendix appears normal. No evidence of  bowel wall thickening, distention, or inflammatory changes. Vascular/Lymphatic: Aortic atherosclerosis. No enlarged abdominal or pelvic lymph nodes. Reproductive: Status post prostatectomy. Other: No abdominal wall hernia or abnormality. No abdominopelvic ascites. Musculoskeletal: Status post kyphoplasty at T12, L2, L3 and L4. Stable old right parasymphyseal fracture is noted. No definite acute osseous abnormality is noted. IMPRESSION: Small left pleural effusion is noted with adjacent subsegmental atelectasis of the left lower lobe. Mild cholelithiasis without inflammation. Bilateral nonobstructive nephrolithiasis. Status post prostatectomy. Aortic Atherosclerosis (ICD10-I70.0). Electronically Signed   By: Marijo Conception M.D.   On: 02/26/2022 08:51   DG Chest Port 1 View  Result Date: 02/26/2022 CLINICAL DATA:  Questionable sepsis - evaluate for abnormality Nausea, vomiting and diarrhea. History of lung cancer, active chemotherapy. EXAM: PORTABLE CHEST 1 VIEW COMPARISON:  Radiograph 12/18/2021 FINDINGS: Left chest port remains in place. Decreased left pleural effusion with persistent pleuroparenchymal opacity at the left lung base. Mild diffuse increased density in the left hemithorax may represent layering effusion or airspace disease. This is similar to prior. Known left lung mass is not well seen by radiograph. Mild atelectasis at the right lung base. Stable heart size and mediastinal contours. No pneumothorax. IMPRESSION: 1. Decreased left pleural effusion from prior exam. Persistent pleuroparenchymal opacity at the left lung base. 2. Mild diffuse increased density in the left hemithorax may represent layering effusion or airspace disease, unchanged. Electronically  Signed   By: Keith Rake M.D.   On: 02/26/2022 00:04     Malignant neoplasm of upper lobe of left lung [facility]) # May 2023- Stage IV (T1c, N2, M a) non-small cell lung cancer, adenocarcinoma presented with left lingular nodule in addition  to pleural nodularity and thickening throughout the left hemothorax as well as malignant left pleural effusion and pericardiac lymphadenopathy diagnosed in  Molecular studies by foundation 1 showed no actionable mutations. Check PDL-1 status.  #Patient currently status post cycle number 2 of [place] [July 26th, 2023].  Unfortunately extremely poor tolerance to chemotherapy-needing admission to the hospital/sepsis/will nausea vomiting.  #Recommend restaging CAT scan-CAP to assess response to therapy.  Also discussed regarding pros and cons of combination of chemotherapy-chemotherapy versus single agent Keytruda.  Given patient's poor tolerance-plan Keytruda [lesser response rates; at the same time lesser risk of side effects]  # Hx of Prostate cancer- post prostatectomy in 2003  # Hx of Multiple Myeloma [2012]- I remission.   # Pain- chronic- 2 in AM; 2 in PM; Not on fentanyl patch.   # IV ACCESS: port flush-   # DISPOSITION: # CT CAP ASAP # labs today- cbc/cmp; PSA; iron studies; ferritin- LDH; CEA;  # follow up TBD- Dr.B  # 40 minutes face-to-face with the patient discussing the above plan of care; more than 50% of time spent on prognosis/ natural history; counseling and coordination.   # I reviewed the blood work- with the patient in detail; also reviewed the imaging independently [as summarized above]; and with the patient in detail.       Above plan of care was discussed with patient/family in detail.  My contact information was given to the patient/family.       Cammie Sickle, MD 03/17/2022 6:59 PM

## 2022-03-17 NOTE — Progress Notes (Signed)
Met with patient during his initial visit with Dr. Rogue Bussing. All questions answered during visit. Reviewed upcoming appts with pt and his caregiver. Contact info given and instructed to call with any questions or needs. Pt informed that will be called with his results from his CT scan. Pt verbalized understanding. Nothing further needed at this time.

## 2022-03-17 NOTE — Progress Notes (Signed)
ON PATHWAY REGIMEN - Non-Small Cell Lung  No Change  Continue With Treatment as Ordered.  Original Decision Date/Time: 01/19/2022 16:01     A cycle is every 21 days:     Pembrolizumab      Pemetrexed      Carboplatin   **Always confirm dose/schedule in your pharmacy ordering system**  Patient Characteristics: Stage IV Metastatic, Nonsquamous, Molecular Analysis Completed, Molecular Alteration Present and Targeted Therapy Exhausted OR EGFR Exon 20+ or KRAS G12C+ or HER2+ Present and No Prior Chemo/Immunotherapy OR No Alteration Present, Initial  Chemotherapy/Immunotherapy, PS = 0, 1, No Alteration Present, No Alteration Present, Candidate for Immunotherapy, PD-L1 Expression Positive 1-49% (TPS) / Negative / Not Tested / Awaiting Test Results and Immunotherapy Candidate Therapeutic Status: Stage IV Metastatic Histology: Nonsquamous Cell Broad Molecular Profiling Status: Molecular Analysis Completed Molecular Analysis Results: No Alteration Present ECOG Performance Status: 1 Chemotherapy/Immunotherapy Line of Therapy: Initial Chemotherapy/Immunotherapy EGFR Exons 18-21 Mutation Testing Status: Completed and Negative ALK Fusion/Rearrangement Testing Status: Completed and Negative BRAF V600 Mutation Testing Status: Completed and Negative KRAS G12C Mutation Testing Status: Completed and Negative MET Exon 14 Mutation Testing Status: Completed and Negative RET Fusion/Rearrangement Testing Status: Completed and Negative HER2 Mutation Testing Status: Completed and Negative NTRK Fusion/Rearrangement Testing Status: Completed and Negative ROS1 Fusion/Rearrangement Testing Status: Completed and Negative Immunotherapy Candidate Status: Candidate for Immunotherapy PD-L1 Expression Status: Awaiting Test Results Intent of Therapy: Non-Curative / Palliative Intent, Discussed with Patient

## 2022-03-17 NOTE — Assessment & Plan Note (Addendum)
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 Check PDL-1 status.  #Patient currently status post cycle number 2 of [place] [July 26th, 2023].  Unfortunately extremely poor tolerance to chemotherapy-needing admission to the hospital/sepsis/will nausea vomiting.  #Recommend restaging CAT scan-CAP to assess response to therapy.  Also discussed regarding pros and cons of combination of chemotherapy-chemotherapy versus single agent Keytruda.  Given patient's poor tolerance-plan Keytruda [lesser response rates; at the same time lesser risk of side effects]  # Hx of Prostate cancer- post prostatectomy in 2003  # Hx of Multiple Myeloma [2012]- I remission.   # Pain- chronic- 2 in AM; 2 in PM; Not on fentanyl patch.   # IV ACCESS: port flush-   # DISPOSITION: # CT CAP ASAP # labs today- cbc/cmp; PSA; iron studies; ferritin- LDH; CEA;  # follow up TBD- Dr.B  # 40 minutes face-to-face with the patient discussing the above plan of care; more than 50% of time spent on prognosis/ natural history; counseling and coordination.   # I reviewed the blood work- with the patient in detail; also reviewed the imaging independently [as summarized above]; and with the patient in detail.

## 2022-03-18 ENCOUNTER — Ambulatory Visit
Admission: RE | Admit: 2022-03-18 | Discharge: 2022-03-18 | Disposition: A | Discharge status: Home / Self Care | Payer: Medicare Other | Source: Ambulatory Visit | Attending: Internal Medicine | Admitting: Internal Medicine

## 2022-03-18 DIAGNOSIS — C3412 Malignant neoplasm of upper lobe, left bronchus or lung: Secondary | ICD-10-CM | POA: Insufficient documentation

## 2022-03-18 LAB — CEA: CEA: 48.8 ng/mL — ABNORMAL HIGH (ref 0.0–4.7)

## 2022-03-18 MED ORDER — IOHEXOL 300 MG/ML  SOLN
100.0000 mL | Freq: Once | INTRAMUSCULAR | Status: AC | PRN
  Administered 2022-03-18: 85 mL via INTRAVENOUS

## 2022-03-19 ENCOUNTER — Other Ambulatory Visit: Payer: Self-pay | Admitting: Internal Medicine

## 2022-03-19 ENCOUNTER — Other Ambulatory Visit: Payer: Medicare Other

## 2022-03-19 ENCOUNTER — Ambulatory Visit: Payer: Medicare Other | Admitting: Dietician

## 2022-03-19 ENCOUNTER — Encounter: Payer: Self-pay | Admitting: Internal Medicine

## 2022-03-19 NOTE — Progress Notes (Signed)
Nutrition Follow Up: Reached out to patient at home telephone number and [facility] on voice mail.  I sent text to mobile for patient to reach out today and if I didn't hear from him I would call Tuesday.    Labs: 03/17/22  K+ 3.0, Albumin 3.0, Hgb 8.5 (rebounded)     Anthropometrics: Weight up 3# since previous significant loss   Height: 74" Weight:  03/17/22  163# 02/25/22  160# 02/05/22  171# 01/19/22  179# 01/05/22   193#   UBW: 185-188  NUTRITION DIAGNOSIS (per IP RD):    Severe Malnutrition related to chronic illness (lung cancer) as evidenced by percent weight loss, severe fat depletion, severe muscle depletion.  Improving with 3 # weight gain.  April Manson, RDN, LDN Registered Dietitian, [place] Part Time Remote (Usual office hours: Tuesday-Thursday) Mobile: [phone] Remote Office: [phone]

## 2022-03-19 NOTE — Progress Notes (Signed)
Tried to reach the patient to discuss the results of the CT scan.  Unable.  I left a detailed voicemail with regards to the results of the CT scan, elevated PSA and also as we are waiting on PD-L1 .   [name] - as there is no obvious progression. We will wait on PDL-1 resultts before confirming treatment plan. Please  also inform patient that he might need eligard injection, given his elevated PSA at next visit. Please keep me poste d of PDL results

## 2022-03-24 ENCOUNTER — Ambulatory Visit: Payer: Medicare Other | Admitting: Dietician

## 2022-03-24 NOTE — Progress Notes (Signed)
Attempted to reach patient to follow up on PO intake and nutrition impact symptoms.  Larene Beach (caregiver) answered his home telephone number and stated he couldn't come to the phone.  She reports he is eating well at this time.  When asked when would be a good time to speak with patient, she said after appointment with new medical oncologist.  Will attempt again on 04/02/22 she said afternoons are best.  April Manson, RDN, LDN Registered Dietitian, [place] (Usual office hours: Tuesday-Thursday) Mobile: [phone] Remote Office: [phone]

## 2022-03-26 ENCOUNTER — Other Ambulatory Visit: Payer: Medicare Other

## 2022-03-27 ENCOUNTER — Other Ambulatory Visit: Payer: Self-pay | Admitting: Internal Medicine

## 2022-03-27 ENCOUNTER — Other Ambulatory Visit: Payer: Self-pay

## 2022-03-27 DIAGNOSIS — R5383 Other fatigue: Secondary | ICD-10-CM

## 2022-03-27 DIAGNOSIS — C3412 Malignant neoplasm of upper lobe, left bronchus or lung: Secondary | ICD-10-CM

## 2022-03-27 DIAGNOSIS — C9002 Multiple myeloma in relapse: Secondary | ICD-10-CM

## 2022-03-27 NOTE — Progress Notes (Signed)
BC- please schedule appt on 9/12- MD; labs- port/cbc/cmp;TSH; keytruda-GB

## 2022-03-27 NOTE — Progress Notes (Signed)
Spoke with patient and his son will be in town that week and requests that his appt be moved to the following week if possible. Pt's wife has an appt on 9/19 so requests if he can schedule his appt for 9/20. Informed pt that will call him next week to confirm appt once Dr. Jacinto Reap adjusts the treatment plan. Pt verbalized understanding.

## 2022-03-27 NOTE — Progress Notes (Signed)
Labs entered.

## 2022-03-31 ENCOUNTER — Other Ambulatory Visit: Payer: Self-pay | Admitting: Internal Medicine

## 2022-03-31 NOTE — Progress Notes (Signed)
Appt has been rescheduled to 9/20 at 10:15am. Spoke with patient and he is aware of appt date/time.

## 2022-04-01 ENCOUNTER — Inpatient Hospital Stay: Payer: Medicare Other | Attending: Hematology and Oncology | Admitting: Hospice and Palliative Medicine

## 2022-04-01 DIAGNOSIS — D7589 Other specified diseases of blood and blood-forming organs: Secondary | ICD-10-CM | POA: Insufficient documentation

## 2022-04-01 DIAGNOSIS — Z79899 Other long term (current) drug therapy: Secondary | ICD-10-CM | POA: Insufficient documentation

## 2022-04-01 DIAGNOSIS — J910 Malignant pleural effusion: Secondary | ICD-10-CM | POA: Insufficient documentation

## 2022-04-01 DIAGNOSIS — C3412 Malignant neoplasm of upper lobe, left bronchus or lung: Secondary | ICD-10-CM | POA: Insufficient documentation

## 2022-04-01 DIAGNOSIS — C9001 Multiple myeloma in remission: Secondary | ICD-10-CM | POA: Insufficient documentation

## 2022-04-01 DIAGNOSIS — Z5112 Encounter for antineoplastic immunotherapy: Secondary | ICD-10-CM | POA: Insufficient documentation

## 2022-04-01 NOTE — Progress Notes (Signed)
Multidisciplinary Oncology Council Documentation  [name] was presented by our [facility] on 04/01/2022, which included representatives from:  Palliative Care Dietitian  Physical/Occupational Therapist Nurse Navigator Genetics Speech Therapist Social work Survivorship RN Financial Navigator Research RN   Om currently presents with history of lung cancer  We reviewed previous medical and familial history, history of present illness, and recent lab results along with all available histopathologic and imaging studies. The [place] considered available treatment options and made the following recommendations/referrals:  Consider palliative care, rehab screening  The MOC is a meeting of clinicians from various specialty areas who evaluate and discuss patients for whom a multidisciplinary approach is being considered. Final determinations in the plan of care are those of the provider(s).   Today's extended care, comprehensive team conference, [name] was not present for the discussion and was not examined.

## 2022-04-02 ENCOUNTER — Ambulatory Visit: Payer: Medicare Other

## 2022-04-02 ENCOUNTER — Other Ambulatory Visit: Payer: Medicare Other

## 2022-04-02 ENCOUNTER — Ambulatory Visit: Payer: Medicare Other | Admitting: Internal Medicine

## 2022-04-02 ENCOUNTER — Inpatient Hospital Stay: Payer: Medicare Other | Admitting: Dietician

## 2022-04-02 NOTE — Progress Notes (Signed)
Second attempt to reach patient to follow up on PO intake and nutrition impact symptoms.  Other appointments have been rescheduled. [place] on voice mail at home telephone#.  April Manson, RDN, LDN Registered Dietitian, [place] Part Time Remote (Usual office hours: Tuesday-Thursday) Mobile: [phone] Remote Office: [phone]

## 2022-04-06 ENCOUNTER — Encounter: Payer: Self-pay | Admitting: *Deleted

## 2022-04-07 ENCOUNTER — Ambulatory Visit: Payer: Medicare Other | Admitting: Internal Medicine

## 2022-04-07 ENCOUNTER — Ambulatory Visit: Payer: Medicare Other

## 2022-04-07 ENCOUNTER — Other Ambulatory Visit: Payer: Medicare Other

## 2022-04-07 ENCOUNTER — Other Ambulatory Visit (HOSPITAL_COMMUNITY): Payer: Self-pay | Admitting: Urology

## 2022-04-07 DIAGNOSIS — C61 Malignant neoplasm of prostate: Secondary | ICD-10-CM

## 2022-04-08 ENCOUNTER — Ambulatory Visit: Payer: Medicare Other | Admitting: Dietician

## 2022-04-08 NOTE — Progress Notes (Signed)
Nutrition Follow Up: Reached out to patient at home telephone number. Patient reports his appetite getting back to normal for him. He is still having some taste changes. Bowels are regular.   Eating 5-6 times a day, with "good foods" Snacks throughout day, applesauce and some nuts   Fluids:  just water  He rush me off the call stated his son was up visiting from [place].  I explained that I would be checking in on him remotely and if he can maintain and improved nutritional status I would continue to follow monthly.     Labs: NNL since 03/17/22      Anthropometrics: Weight up 3# since previous significant loss   Height: 74" Weight:  03/17/22  163# 02/25/22  160# 02/05/22  171# 01/19/22  179# 01/05/22   193#   UBW: 185-188 BMI: 21.02     Estimated Energy Needs   Kcals: 2200-2500 Protein: 88-110 Fluid: 2.5 L   NUTRITION DIAGNOSIS (per IP RD):    Severe Malnutrition related to chronic illness (lung cancer) as evidenced by percent weight loss, severe fat depletion, severe muscle depletion.  Improving with 3 # weight gain.     INTERVENTION:    Continue to encourage small frequent feeds, attempted to discuss and encourage high potassium choices but he didn't want to stay on call.   MONITORING, EVALUATION, GOAL: weight trends, nutrition impact symptoms, PO intake, labs   Next Visit: next month after MD visit and infusion   April Manson, RDN, LDN Registered Dietitian, [place] (Usual office hours: Tuesday-Thursday) Mobile: [phone]

## 2022-04-09 ENCOUNTER — Other Ambulatory Visit: Payer: Medicare Other

## 2022-04-15 ENCOUNTER — Encounter: Payer: Self-pay | Admitting: Internal Medicine

## 2022-04-15 ENCOUNTER — Other Ambulatory Visit: Payer: Medicare Other

## 2022-04-15 ENCOUNTER — Inpatient Hospital Stay: Payer: Medicare Other | Admitting: Occupational Therapy

## 2022-04-15 ENCOUNTER — Inpatient Hospital Stay (HOSPITAL_BASED_OUTPATIENT_CLINIC_OR_DEPARTMENT_OTHER): Payer: Medicare Other | Admitting: Internal Medicine

## 2022-04-15 ENCOUNTER — Ambulatory Visit: Payer: Medicare Other | Admitting: Internal Medicine

## 2022-04-15 ENCOUNTER — Encounter: Payer: Self-pay | Admitting: *Deleted

## 2022-04-15 ENCOUNTER — Inpatient Hospital Stay: Payer: Medicare Other

## 2022-04-15 ENCOUNTER — Ambulatory Visit: Payer: Medicare Other

## 2022-04-15 VITALS — BP 107/73 | HR 90 | Temp 96.7°F | Resp 18 | Wt 158.2 lb

## 2022-04-15 DIAGNOSIS — C9001 Multiple myeloma in remission: Secondary | ICD-10-CM | POA: Diagnosis not present

## 2022-04-15 DIAGNOSIS — R5383 Other fatigue: Secondary | ICD-10-CM

## 2022-04-15 DIAGNOSIS — Z859 Personal history of malignant neoplasm, unspecified: Secondary | ICD-10-CM

## 2022-04-15 DIAGNOSIS — Z79899 Other long term (current) drug therapy: Secondary | ICD-10-CM | POA: Diagnosis not present

## 2022-04-15 DIAGNOSIS — C3412 Malignant neoplasm of upper lobe, left bronchus or lung: Secondary | ICD-10-CM

## 2022-04-15 DIAGNOSIS — C9002 Multiple myeloma in relapse: Secondary | ICD-10-CM

## 2022-04-15 DIAGNOSIS — Z5112 Encounter for antineoplastic immunotherapy: Secondary | ICD-10-CM | POA: Diagnosis present

## 2022-04-15 DIAGNOSIS — D7589 Other specified diseases of blood and blood-forming organs: Secondary | ICD-10-CM

## 2022-04-15 DIAGNOSIS — J910 Malignant pleural effusion: Secondary | ICD-10-CM | POA: Diagnosis not present

## 2022-04-15 DIAGNOSIS — E876 Hypokalemia: Secondary | ICD-10-CM

## 2022-04-15 DIAGNOSIS — R5382 Chronic fatigue, unspecified: Secondary | ICD-10-CM

## 2022-04-15 LAB — CBC WITH DIFFERENTIAL/PLATELET
Abs Immature Granulocytes: 0.03 10*3/uL (ref 0.00–0.07)
Basophils Absolute: 0.1 10*3/uL (ref 0.0–0.1)
Basophils Relative: 1 %
Eosinophils Absolute: 0.3 10*3/uL (ref 0.0–0.5)
Eosinophils Relative: 4 %
HCT: 33.1 % — ABNORMAL LOW (ref 39.0–52.0)
Hemoglobin: 11.3 g/dL — ABNORMAL LOW (ref 13.0–17.0)
Immature Granulocytes: 0 %
Lymphocytes Relative: 11 %
Lymphs Abs: 0.9 10*3/uL (ref 0.7–4.0)
MCH: 35.9 pg — ABNORMAL HIGH (ref 26.0–34.0)
MCHC: 34.1 g/dL (ref 30.0–36.0)
MCV: 105.1 fL — ABNORMAL HIGH (ref 80.0–100.0)
Monocytes Absolute: 0.9 10*3/uL (ref 0.1–1.0)
Monocytes Relative: 11 %
Neutro Abs: 5.8 10*3/uL (ref 1.7–7.7)
Neutrophils Relative %: 73 %
Platelets: 168 10*3/uL (ref 150–400)
RBC: 3.15 MIL/uL — ABNORMAL LOW (ref 4.22–5.81)
RDW: 15.9 % — ABNORMAL HIGH (ref 11.5–15.5)
WBC: 7.9 10*3/uL (ref 4.0–10.5)
nRBC: 0 % (ref 0.0–0.2)

## 2022-04-15 LAB — COMPREHENSIVE METABOLIC PANEL
ALT: 14 U/L (ref 0–44)
AST: 34 U/L (ref 15–41)
Albumin: 3.3 g/dL — ABNORMAL LOW (ref 3.5–5.0)
Alkaline Phosphatase: 40 U/L (ref 38–126)
Anion gap: 6 (ref 5–15)
BUN: 15 mg/dL (ref 8–23)
CO2: 28 mmol/L (ref 22–32)
Calcium: 8.7 mg/dL — ABNORMAL LOW (ref 8.9–10.3)
Chloride: 102 mmol/L (ref 98–111)
Creatinine, Ser: 0.88 mg/dL (ref 0.61–1.24)
GFR, Estimated: >60 mL/min (ref >60)
Glucose, Bld: 141 mg/dL — ABNORMAL HIGH (ref 70–99)
Potassium: 2.7 mmol/L — CL (ref 3.5–5.1)
Sodium: 136 mmol/L (ref 135–145)
Total Bilirubin: 0.6 mg/dL (ref 0.3–1.2)
Total Protein: 7 g/dL (ref 6.5–8.1)

## 2022-04-15 LAB — TSH: TSH: 2.476 u[IU]/mL (ref 0.350–4.500)

## 2022-04-15 MED ORDER — POTASSIUM CHLORIDE 20 MEQ PO PACK: 40.0000 meq | PACK | Freq: Once | ORAL | Status: DC

## 2022-04-15 MED ORDER — POTASSIUM CHLORIDE CRYS ER 10 MEQ PO TBCR: 40.0000 meq | EXTENDED_RELEASE_TABLET | Freq: Once | ORAL | Status: DC

## 2022-04-15 MED ORDER — POTASSIUM CHLORIDE 20 MEQ PO PACK: 20.0000 meq | PACK | Freq: Every day | ORAL | 0 refills | Status: DC

## 2022-04-15 MED ORDER — PROCHLORPERAZINE MALEATE 10 MG PO TABS
10.0000 mg | ORAL_TABLET | Freq: Once | ORAL | Status: AC
  Administered 2022-04-15: 10 mg via ORAL
  Filled 2022-04-15: qty 1

## 2022-04-15 MED ORDER — SODIUM CHLORIDE 0.9 % IV SOLN
200.0000 mg | Freq: Once | INTRAVENOUS | Status: AC
  Administered 2022-04-15: 200 mg via INTRAVENOUS
  Filled 2022-04-15: qty 8

## 2022-04-15 MED ORDER — HEPARIN SOD (PORK) LOCK FLUSH 100 UNIT/ML IV SOLN
500.0000 [IU] | Freq: Once | INTRAVENOUS | Status: AC | PRN
  Administered 2022-04-15: 500 [IU]
  Filled 2022-04-15: qty 5

## 2022-04-15 MED ORDER — SODIUM CHLORIDE 0.9% FLUSH
10.0000 mL | INTRAVENOUS | Status: DC | PRN
  Administered 2022-04-15: 10 mL
  Filled 2022-04-15: qty 10

## 2022-04-15 MED ORDER — SODIUM CHLORIDE 0.9 % IV SOLN
Freq: Once | INTRAVENOUS | Status: AC
  Filled 2022-04-15: qty 250

## 2022-04-15 MED ORDER — SODIUM CHLORIDE 0.9 % IV SOLN
40.0000 meq | Freq: Once | INTRAVENOUS | Status: DC
  Administered 2022-04-15: 40 meq via INTRAVENOUS
  Filled 2022-04-15: qty 20

## 2022-04-15 NOTE — Patient Instructions (Signed)
[facility]  Discharge Instructions: Thank you for choosing [facility] to provide your oncology and hematology care.  If you have a lab appointment with the Cancer Center, please go directly to the Cancer Center and check in at the registration area.  Wear comfortable clothing and clothing appropriate for easy access to any Portacath or PICC line.   We strive to give you quality time with your provider. You may need to reschedule your appointment if you arrive late (15 or more minutes).  Arriving late affects you and other patients whose appointments are after yours.  Also, if you miss three or more appointments without notifying the office, you may be dismissed from the clinic at the provider's discretion.      For prescription refill requests, have your pharmacy contact our office and allow 72 hours for refills to be completed.    Today you received the following chemotherapy and/or immunotherapy agents Keytruda      To help prevent nausea and vomiting after your treatment, we encourage you to take your nausea medication as directed.  BELOW ARE SYMPTOMS THAT SHOULD BE REPORTED IMMEDIATELY: *FEVER GREATER THAN 100.4 F (38 C) OR HIGHER *CHILLS OR SWEATING *NAUSEA AND VOMITING THAT IS NOT CONTROLLED WITH YOUR NAUSEA MEDICATION *UNUSUAL SHORTNESS OF BREATH *UNUSUAL BRUISING OR BLEEDING *URINARY PROBLEMS (pain or burning when urinating, or frequent urination) *BOWEL PROBLEMS (unusual diarrhea, constipation, pain near the anus) TENDERNESS IN MOUTH AND THROAT WITH OR WITHOUT PRESENCE OF ULCERS (sore throat, sores in mouth, or a toothache) UNUSUAL RASH, SWELLING OR PAIN  UNUSUAL VAGINAL DISCHARGE OR ITCHING   Items with * indicate a potential emergency and should be followed up as soon as possible or go to the Emergency Department if any problems should occur.  Please show the CHEMOTHERAPY ALERT CARD or IMMUNOTHERAPY ALERT CARD at check-in to  the Emergency Department and triage nurse.  Should you have questions after your visit or need to cancel or reschedule your appointment, please contact [facility]  [phone] and follow the prompts.  Office hours are 8:00 a.m. to 4:30 p.m. Monday - Friday. Please note that voicemails left after 4:00 p.m. may not be returned until the following business day.  We are closed weekends and major holidays. You have access to a nurse at all times for urgent questions. Please call the main number to the clinic [phone] and follow the prompts.  For any non-urgent questions, you may also contact your provider using MyChart. We now offer e-Visits for anyone 18 and older to request care online for non-urgent symptoms. For details visit [email or web address].   Also download the MyChart app! Go to the app store, search "MyChart", open the app, select [facility], and log in with your MyChart username and password.  Masks are optional in the cancer centers. If you would like for your care team to wear a mask while they are taking care of you, please let them know. For doctor visits, patients may have with them one support person who is at least 76 years old. At this time, visitors are not allowed in the infusion area.   

## 2022-04-15 NOTE — Progress Notes (Signed)
[facility] NOTE  Patient Care Team: Nicholas Lose, MD as PCP - General (Hematology and Oncology) Jeanann Lewandowsky, MD as Consulting Physician (Hematology and Oncology) Cathlean Marseilles, NP as Nurse Practitioner (Hematology and Oncology) Raynelle Bring, MD as Consulting Physician (Urology) Garrel Ridgel, [place] as Consulting Physician (Podiatry) Morton Stall, Howell Rucks, NP as Nurse Practitioner (Pain Medicine) Druscilla Brownie, MD as Consulting Physician (Dermatology) Bryson Ha, OD as Consulting Physician (Optometry) Nicholas Lose, MD as Consulting Physician (Hematology and Oncology) Telford Nab, RN as Oncology Nurse Navigator  CHIEF COMPLAINTS/PURPOSE OF CONSULTATION: Lung cancer  Oncology History Overview Note  Adenosquamous lung cancer  # MULTIPLE MYELOMA [Dr.Gorsuch]; s/p Auto-Transplant [[facility]]- remission.   # MAY 2023-   # The patient is currently undergoing systemic chemotherapy with carboplatin for AUC of 5, Alimta 500 Mg/M2 and Keytruda 200 Mg IV every 3 weeks status post 1 cycle started on January 29, 2022. [Dr.Mohammed; [place]]  #Prostate cancer prostatectomy 2003; multiple myeloma- 2012-remission   Malignant neoplasm of prostate ([place])  02/23/2007 Initial Diagnosis   Malignant neoplasm of prostate ([place])   10/28/2020 Cancer Staging   Staging form: Prostate, AJCC 6th Edition - Clinical: Stage III (T3, N0, M0) - Signed by Heath Lark, MD on 10/28/2020   11/24/2021 Procedure   Successful ultrasound guided diagnostic and therapeutic left thoracentesis yielding 750 cc of pleural fluid.     12/02/2021 PET scan   1. Hypermetabolic nodular areas of consolidation in the left lung with a hypermetabolic malignant left pleural effusion. Hypermetabolism extends inferiorly along the left hemidiaphragm. Associated hypermetabolic prepericardiac lymph nodes. Findings may be metastatic. Primary bronchogenic carcinoma cannot be excluded. 2. Uptake in the soft tissues of the  distal left foot with associated edema in the soft tissues. Please correlate clinically. 3. Cholelithiasis and choledocholithiasis. No biliary ductal dilatation. 4. Bilateral renal stones. 5. Aortic atherosclerosis (ICD10-I70.0). Coronary artery calcification.     12/18/2021 Procedure   Successful CT guided core needle core biopsy of LEFT lung mass, as above   Multiple myeloma in relapse ([place])  07/09/2011 Initial Diagnosis   Multiple myeloma in relapse ([place])   01/28/2022 - 02/19/2022 Chemotherapy   Patient is on Treatment Plan : LUNG Carboplatin (5) + Pemetrexed (500) + Pembrolizumab (200) D1 q21d Induction x 4 cycles / Maintenance Pemetrexed (500) + Pembrolizumab (200) D1 q21d     01/28/2022 -  Chemotherapy   Patient is on Treatment Plan : LUNG Carboplatin (5) + Pemetrexed (500) + Pembrolizumab (200) D1 q21d Induction x 4 cycles / Maintenance Pemetrexed (500) + Pembrolizumab (200) D1 q21d     Lung cancer ([place])  12/02/2021 PET scan   1. Hypermetabolic nodular areas of consolidation in the left lung with a hypermetabolic malignant left pleural effusion. Hypermetabolism extends inferiorly along the left hemidiaphragm. Associated hypermetabolic prepericardiac lymph nodes. Findings may be metastatic. Primary bronchogenic carcinoma cannot be excluded. 2. Uptake in the soft tissues of the distal left foot with associated edema in the soft tissues. Please correlate clinically. 3. Cholelithiasis and choledocholithiasis. No biliary ductal dilatation. 4. Bilateral renal stones. 5. Aortic atherosclerosis (ICD10-I70.0). Coronary artery calcification.   12/18/2021 Pathology Results   A. LUNG MASS, LEFT, BIOPSY:  -  Adenocarcinoma  -  See comment  Six immunohistochemical stains were performed with adequate control. The adenocarcinoma is diffusely and strongly positive for cytokeratin 7 and the pulmonary adeno marker TTF-1.  The tumor is negative for the pulmonary adeno marker Napsin A.  The tumor does show  a patchy  focal positivity for the squamous marker p40 suggesting an adenosquamous differentiation to this tumor.  The tumor is negative for the prostate markers prostate-specific antigen and prostein.    12/23/2021 Initial Diagnosis   Lung cancer ([place])   12/23/2021 Cancer Staging   Staging form: Lung, AJCC 8th Edition - Clinical stage from 12/23/2021: Stage IVA (cT1c, cN2, cM1a) - Signed by Curt Bears, MD on 01/05/2022 Stage prefix: Initial diagnosis   01/01/2022 Imaging   MRI brain Negative for metastatic disease.   01/28/2022 - 02/19/2022 Chemotherapy   Patient is on Treatment Plan : LUNG Carboplatin (5) + Pemetrexed (500) + Pembrolizumab (200) D1 q21d Induction x 4 cycles / Maintenance Pemetrexed (500) + Pembrolizumab (200) D1 q21d     01/28/2022 -  Chemotherapy   Patient is on Treatment Plan : LUNG Carboplatin (5) + Pemetrexed (500) + Pembrolizumab (200) D1 q21d Induction x 4 cycles / Maintenance Pemetrexed (500) + Pembrolizumab (200) D1 q21d     Malignant neoplasm of upper lobe of left lung ([place])  01/28/2022 -  Chemotherapy   Patient is on Treatment Plan : LUNG Carboplatin (5) + Pemetrexed (500) + Pembrolizumab (200) D1 q21d Induction x 4 cycles / Maintenance Pemetrexed (500) + Pembrolizumab (200) D1 q21d     03/17/2022 Initial Diagnosis   Malignant neoplasm of upper lobe of left lung ([place])   03/17/2022 Cancer Staging   Staging form: Lung, AJCC 8th Edition - Clinical: Stage IVB (cT2, cN2, cM1c) - Signed by Cammie Sickle, MD on 03/17/2022     HISTORY OF PRESENTING ILLNESS: Patient in wheelchair.  Accompanied by his friend.   [name] 76 y.o.  male with multiple medical problems-including history of multiple myeloma in remission; Dx in May 2023- stage IV lung cancer adeno-squamous carcinoma currently palliative immunotherapy.    Patient seen today accompained with wife to start maintenance single agent [place]. He had difficulty tolerating chemo.  He feels weak. Denies  fever, chills, n/v, abdo pain. Diarrhea resolved.    Review of Systems  Constitutional:  Positive for malaise/fatigue and weight loss. Negative for chills, diaphoresis and fever.  HENT:  Negative for nosebleeds and sore throat.   Eyes:  Negative for double vision.  Respiratory:  Negative for cough, hemoptysis, sputum production, shortness of breath and wheezing.   Cardiovascular:  Negative for chest pain, palpitations, orthopnea and leg swelling.  Gastrointestinal:  Negative for abdominal pain, blood in stool, constipation, diarrhea, heartburn, melena, nausea and vomiting.  Genitourinary:  Negative for dysuria, frequency and urgency.  Musculoskeletal:  Positive for back pain and joint pain.  Skin: Negative.  Negative for itching and rash.  Neurological:  Positive for weakness. Negative for dizziness, tingling, focal weakness and headaches.  Endo/Heme/Allergies:  Does not bruise/bleed easily.  Psychiatric/Behavioral:  Negative for depression. The patient is not nervous/anxious and does not have insomnia.     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 Leonie Man)    CATARACT EXTRACTION W/ INTRAOCULAR LENS IMPLANT Bilateral    DEXA  05/2011   spine 0.7, L femur 0.0, R femur -0.6, normal   KIDNEY STONE SURGERY     KIDNEY STONE SURGERY  06/2009   Stone removed from bladder   MELANOMA EXCISION Left 06/05/2019   Procedure: REEXCISION MELANOMA OF LEFT POSTERIOR AURICLE;  Surgeon: Johnathan Hausen, MD;  Location: [facility];  Service: General;  Laterality: Left;   PORTACATH PLACEMENT  08/05/11   right sided portacath   PROSTATECTOMY  04/06/07   TOE AMPUTATION Left 2017   2nd toe left foot    SOCIAL HISTORY: Social History   Socioeconomic History   Marital status: Single    Spouse name: Not on file   Number of children: Not on file   Years of education: Not on file   Highest education level: Not on file  Occupational History   Occupation: Sales    Comment: [employer] [place]  Tobacco Use   Smoking status: Former    Packs/day: 2.00    Years: 30.00    Total pack years: 60.00    Types: Cigarettes    Quit date: 07/27/1996    Years since quitting: 25.7   Smokeless tobacco: Never  Vaping Use   Vaping Use: Never used  Substance and Sexual Activity   Alcohol use: No   Drug use: No   Sexual activity: Never  Other Topics Concern   Not on file  Social History Narrative   [place]- closer to [place]; condos; [wife with stroke- better]. Quit smoking 1998; no alcohol.    Social Determinants of Health   Financial Resource Strain: Not on file  Food Insecurity: Not on file  Transportation Needs: Not on file  Physical Activity: Not on file  Stress: Not on file  Social Connections: Not on file  Intimate Partner Violence: Not on file    FAMILY HISTORY: Family History  Problem Relation Age of Onset   Pneumonia Mother    Stroke Father    Cancer Brother        lung   Kidney cancer Sister    Colon cancer Neg Hx     ALLERGIES:  is allergic to celebrex [celecoxib], cymbalta [duloxetine hcl], hydromorphone hcl,  meloxicam, oxycontin [oxycodone], percocet [oxycodone-acetaminophen], vicodin [hydrocodone-acetaminophen], and lyrica [pregabalin].  MEDICATIONS:  Current Outpatient Medications  Medication Sig Dispense Refill   fentaNYL (DURAGESIC) 75 MCG/HR Place 1 patch onto the skin every other day.     pantoprazole (PROTONIX) 40 MG tablet Take 1 tablet (40 mg total) by mouth daily. 90 tablet 3   potassium chloride (KLOR-CON) 20 MEQ packet Take 20 mEq by mouth daily for 5 days. 5 packet 0   aspirin 81 MG tablet Take 81 mg by mouth daily. (Patient not taking: Reported on 03/17/2022)     B Complex Vitamins (VITAMIN-B COMPLEX) TABS Take 1 tablet by mouth daily. (Patient not taking: Reported on 03/17/2022)     Calcium Carbonate-Vitamin D 600-400 MG-UNIT per tablet Take 1 tablet by mouth daily. (Patient not taking: Reported on 03/17/2022)     cholecalciferol (VITAMIN D)  1000 UNITS tablet Take 1,000 Units by mouth daily. (Patient not taking: Reported on 09/07/2480)     folic acid (FOLVITE) 1 MG tablet Take 1 tablet (1 mg total) by mouth daily. (Patient not taking: Reported on 03/17/2022) 30 tablet 4   lidocaine-prilocaine (EMLA) cream Apply topically as needed. Apply to port site one hour before treatment and cover with plastic wrap (Patient not taking: Reported on 03/17/2022) 30 g 2   metroNIDAZOLE (FLAGYL) 500 MG tablet Take 1 tablet (500 mg total) by mouth every 12 (twelve) hours. (Patient not taking: Reported on 03/17/2022) 5 tablet 0   mirtazapine (REMERON) 15 MG tablet Take 15 mg by mouth at bedtime. (Patient not taking: Reported on 03/17/2022)     NONFORMULARY OR COMPOUNDED ITEM Baclofn/amitrp/ketamn cream topically to feet TID prn for neuropathy (Patient not taking: Reported on 03/17/2022)     NUBEQA 300 MG tablet Take 600 mg by mouth 2 (two) times daily. (Patient not taking: Reported on 03/17/2022)     ondansetron (ZOFRAN) 4 MG tablet Take 4 mg by mouth every 8 (eight) hours as needed. (Patient not taking: Reported on  03/17/2022)     Oxycodone HCl 10 MG TABS Take 1-2 tablets by mouth 3 (three) times daily as needed. (Patient not taking: Reported on 04/15/2022)     potassium chloride SA (KLOR-CON M) 20 MEQ tablet Take 1 tablet (20 mEq total) by mouth daily. (Patient not taking: Reported on 03/17/2022) 6 tablet 0   prochlorperazine (COMPAZINE) 10 MG tablet Take 1 tablet (10 mg total) by mouth every 6 (six) hours as needed for nausea or vomiting. (Patient not taking: Reported on 03/17/2022) 30 tablet 0   Current Facility-Administered Medications  Medication Dose Route Frequency Provider Last Rate Last Admin   potassium chloride (KLOR-CON M) CR tablet 40 mEq  40 mEq Oral Once Jane Canary, MD       potassium chloride 40 mEq in sodium chloride 0.9 % 270 mL (0.1481 mEq/mL) infusion  40 mEq Intravenous Once Jane Canary, MD 135 mL/hr at 04/15/22 1050 40 mEq at 04/15/22 1050   Facility-Administered Medications Ordered in Other Visits  Medication Dose Route Frequency Provider Last Rate Last Admin   heparin lock flush 100 unit/mL  500 Units Intracatheter Once PRN Cammie Sickle, MD       pembrolizumab [facility]) 200 mg in sodium chloride 0.9 % 50 mL chemo infusion  200 mg Intravenous Once Charlaine Dalton R, MD       sodium chloride flush (NS) 0.9 % injection 10 mL  10 mL Intracatheter PRN Cammie Sickle, MD        PHYSICAL EXAMINATION:  Vitals:   04/15/22 0950  BP: 107/73  Pulse: 90  Resp: 18  Temp: (!) 96.7 F (35.9 C)  SpO2: 96%   Filed Weights   04/15/22 0950  Weight: 158 lb 3.2 oz (71.8 kg)    Physical Exam Vitals and nursing note reviewed.  HENT:     Head: Normocephalic and atraumatic.     Mouth/Throat:     Pharynx: Oropharynx is clear.  Eyes:     Extraocular Movements: Extraocular movements intact.     Pupils: Pupils are equal, round, and reactive to light.  Cardiovascular:     Rate and Rhythm: Normal rate and regular rhythm.  Pulmonary:     Comments: Decreased breath  sounds bilaterally.  Abdominal:     Palpations: Abdomen is soft.  Musculoskeletal:        General: Normal range of motion.  Cervical back: Normal range of motion.  Skin:    General: Skin is warm.  Neurological:     General: No focal deficit present.     Mental Status: He is alert and oriented to person, place, and time.  Psychiatric:        Behavior: Behavior normal.        Judgment: Judgment normal.   LABORATORY DATA:  I have reviewed the data as listed Lab Results  Component Value Date   WBC 7.9 04/15/2022   HGB 11.3 (L) 04/15/2022   HCT 33.1 (L) 04/15/2022   MCV 105.1 (H) 04/15/2022   PLT 168 04/15/2022   Recent Labs    02/25/22 1932 02/26/22 0405 02/27/22 1420 03/17/22 1427 04/15/22 0926  NA 135   < > 139 136 136  K 4.3   < > 3.8 3.0* 2.7*  CL 103   < > 107 100 102  CO2 23   < > _0 GLUCOSE 113*   < > 109* 133* 141*  BUN 25*   < > 27* 16 15  CREATININE 1.06   < > 1.06 1.11 0.88  CALCIUM 8.7*   < > 8.8* 8.7* 8.7*  GFRNONAA >60   < > >60 >60 >60  PROT 7.3  --   --  6.6 7.0  ALBUMIN 3.5  --   --  3.0* 3.3*  AST 29  --   --  25 34  ALT 17  --   --  13 14  ALKPHOS 42  --   --  45 40  BILITOT 1.0  --   --  0.5 0.6   < > = values in this interval not displayed.     RADIOGRAPHIC STUDIES: I have personally reviewed the radiological images as listed and agreed with the findings in the report. CT CHEST ABDOMEN PELVIS W CONTRAST  Result Date: 03/19/2022 CLINICAL DATA:  Non-small cell lung cancer. Metastatic disease. Initial multiple myeloma. Additional history of prostate carcinoma. * Tracking Code: BO * EXAM: CT CHEST, ABDOMEN, AND PELVIS WITH CONTRAST TECHNIQUE: Multidetector CT imaging of the chest, abdomen and pelvis was performed following the standard protocol during bolus administration of intravenous contrast. RADIATION DOSE REDUCTION: This exam was performed according to the departmental dose-optimization program which includes automated exposure  control, adjustment of the mA and/or kV according to patient size and/or use of iterative reconstruction technique. CONTRAST:  81m OMNIPAQUE IOHEXOL 300 MG/ML  SOLN COMPARISON:  02/26/2022, PET-CT 12/01/2021 FINDINGS: CT CHEST FINDINGS Cardiovascular: No significant vascular findings. Normal heart size. No pericardial effusion. Mediastinum/Nodes: No axillary or supraclavicular adenopathy. No mediastinal or hilar adenopathy. No pericardial fluid. Esophagus normal. Port in the anterior chest wall with tip in distal SVC. Lungs/Pleura: LEFT upper lobe nodule measures 22 mm 18 mm (image 67/series 3) compared to 23 mm 21 mm on PET-CT scan 12/01/2021. This nodules hypermetabolic at that time. LEFT lower lobe consolidation measures 5.9 x 2.0 cm compared to 5.4 2.2 cm. This consolidation was also hypermetabolic comparison PET-CT. Nodular pleural thickening in the anterior lingula similar (image 41/2). This tissue was also hypermetabolic on comparison PET scan. Loculated LEFT effusion is similar comparison exam No new lesions in the RIGHT lung. Musculoskeletal: No aggressive osseous lesion. CT ABDOMEN AND PELVIS FINDINGS Hepatobiliary: Small low-density lesion in the RIGHT hepatic lobe measuring 9 mm (image 62/2) is indeterminate. Several stones within the gallbladder Pancreas: Pancreas is normal. No ductal dilatation. No pancreatic inflammation. Spleen: Normal spleen Adrenals/urinary tract: Adrenal  glands and kidneys are normal. The ureters and bladder normal. Stomach/Bowel: Stomach, small bowel, appendix, and cecum are normal. The colon and rectosigmoid colon are normal. Vascular/Lymphatic: Abdominal aorta is normal caliber with atherosclerotic calcification. There is no retroperitoneal or periportal lymphadenopathy. No pelvic lymphadenopathy. Reproductive: Post prostatectomy. Other: Chronic fracture in sclerosis of the RIGHT inferior inferior pubic ramus. Vertebroplasty augmentation lower thoracic spine. No evidence of  active skeletal metastasis. Musculoskeletal: No aggressive osseous lesion. IMPRESSION: Chest Impression: 1. Nodules within the LEFT lung and pleural space are not changed significantly in size from comparison PET-CT scan. These nodules were hypermetabolic on comparison PET-CT scan. 2. Stable loculated pleural fluid in the LEFT hemithorax. 3. No evidence of new or progressive malignancy in the thorax. Abdomen / Pelvis Impression: 1. No evidence of metastatic disease in the abdomen pelvis. 2. Small indeterminate lesion in liver. 3. Stable sclerotic lesion and chronic fracture of the RIGHT pubic ramus. Electronically Signed   By: Suzy Bouchard M.D.   On: 03/19/2022 11:52     ASSESSMENT AND PLAN - Malignant neoplasm of upper lobe of left lung [facility]) # May 2023- Stage IV (T1c, N2, M a) non-small cell lung cancer, adenocarcinoma presented with left lingular nodule in addition to pleural nodularity and thickening throughout the left hemothorax as well as malignant left pleural effusion and pericardiac lymphadenopathy diagnosed in  Molecular studies by foundation 1 showed no actionable mutations. Check PDL-1 status.   #Patient status post cycle number 2 of [place] [July 26th, 2023].  Unfortunately extremely poor tolerance to chemotherapy-needing admission to the hospital/sepsis/will nausea vomiting.   #Ct c/a/p from 03/19/22 showed stable disease in lungs. Starting with single agent Keytruda today. Labs reviewed and acceptable.    # Hx of Prostate cancer- post prostatectomy in 2003. PSA rising. Last was 11.4. Will add repeat PSA for next time. I spoke with [facility] and she will discuss with Dr. Jacinto Reap if eligard to be added for next visit.    # Hx of Multiple Myeloma [2012]- I remission.   # Hypokalemia - K 2.7. Replete IV Kcl 40 meq. He cannot take pills. Sent Kcl packet 20 meQ [facility]   # Macrocytosis. Hb improved from prior. Added b12 and folate for next time.     # Pain- chronic- on fentanyl patch   # IV ACCESS: port flush-    # DISPOSITION: # f/u in 3 weeks with Dr. Jacinto Reap, labs, [place].   Above plan of care was discussed with patient/family in detail.  My contact information was given to the patient/family.       Jane Canary, MD 04/15/2022 10:58 AM

## 2022-04-15 NOTE — Progress Notes (Signed)
Patient here today for follow up and treatment consideration regarding lung cancer. Patient reports diarrhea x 4-5 days, has now stopped and has been 2 days with no bowel movement. Patient report pain 4/10 today in back.

## 2022-04-15 NOTE — Therapy (Signed)
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   diffuse scattered osseous metastatic disease   DDD (degenerative disc disease)    Fractures    compression T12,L3   GERD (gastroesophageal reflux disease)    History of chemotherapy    weekly Velcade,Cytoxan   HTN (hypertension)    Hypercalcemia of malignancy    Hyperlipemia    Multiple myeloma 07/09/2011   Multiple myeloma    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 Leonie Man)   CATARACT EXTRACTION W/ INTRAOCULAR LENS IMPLANT Bilateral    DEXA  05/2011   spine 0.7, L femur 0.0, R femur -0.6, normal   KIDNEY STONE SURGERY     KIDNEY STONE SURGERY  06/2009   Stone removed from bladder   MELANOMA EXCISION Left 06/05/2019   Procedure: REEXCISION MELANOMA OF LEFT POSTERIOR AURICLE;  Surgeon: Johnathan Hausen, MD;  Location: [facility];  Service: General;  Laterality: Left;   PORTACATH PLACEMENT  08/05/11   right sided portacath   PROSTATECTOMY  04/06/07   TOE AMPUTATION Left 2017   2nd toe left foot    There were no vitals filed for this visit.   Subjective Assessment - 04/15/22 1128     Subjective  I had a hard time  tolerating the chemotherapy.  And I have back problem and pain-had 2 surgeries in the past.  We do have a caretaker that comes in 5-6 times a week for 5 or 6 hours.  Helps her with her laundry as well as cleaning and cooking/meals.  I do my own bathing and dressing and walks sometimes with a walker longer distances in the house with a cane.    Currently in Pain? Yes    Pain Score 6     Pain Location Back    Pain Orientation Lower    Pain Descriptors / Indicators Aching    Pain Type Chronic pain    Pain Onset More than a month ago    Pain Frequency Constant                ASSESSMENT AND PLAN - Dr Darrall Dears - 04/15/22 Malignant neoplasm of upper lobe of left lung [facility]) # May 2023- Stage IV (T1c, N2, M a) non-small cell lung cancer, adenocarcinoma presented with left lingular nodule in addition to pleural nodularity and thickening throughout the left hemothorax as well as malignant left pleural effusion and pericardiac lymphadenopathy diagnosed in  Molecular studies by foundation 1 showed no actionable mutations. Check PDL-1 status.   #Patient status post cycle number 2 of  [place] [July 26th, 2023].  Unfortunately extremely poor tolerance to chemotherapy-needing admission to the hospital/sepsis/will nausea vomiting.   #Ct c/a/p from 03/19/22 showed stable disease in lungs. Starting with single agent Keytruda today. Labs reviewed and acceptable.    # Hx of Prostate cancer- post prostatectomy in 2003. PSA rising. Last was 11.4. Will add repeat PSA for next time. I spoke with [facility] and she will discuss with Dr. Jacinto Reap if eligard to be added for next visit.    # Hx of Multiple Myeloma [2012]- I remission.    # Hypokalemia - K 2.7. Replete IV Kcl 40 meq. He cannot take pills. Sent Kcl packet 20 meQ [facility]    # Macrocytosis. Hb improved from prior. Added b12 and folate for next time.    # Pain- chronic- on fentanyl patch   # IV ACCESS: port flush-     # DISPOSITION: # f/u in 3 weeks with Dr. Jacinto Reap, labs, [place].    OT SCREEN 04/15/22: Patient seen for OT screen to assess fall risk as well as educating in home program to maintain strength.  Patient lives with wife one-story, no steps to enter as well as a walk-in tub with door and seat.  Patient denies any falls in the last 3 to 6 months.  Patient was hospitalized in July after chemo.  Patient uses a walker for longer distances and a cane for shorter distances in the house. Has a longstanding history of back pain with 2 surgeries in the past.  He does use fentanyl patches for pain. Patient do report he has severe neuropathy in bilateral feet since his previous cancer.  BERG balance test done - pt score on border 41/56 low risk for falling- med risk is 40/56  Pt is limited by back pain and weakness. Decrease range of motion in ankles because of neuropathy. Limited in standing heel-to-toe as well as alternating placing feet on stool and stand on 1 leg by back pain. HEP: Recommend for patient to try and be active at least 4 times a day for 5 to 7 minutes.  Even during chemo. -Try to do meals at the table.  And when walking to the bathroom or dining room walk maybe a second time. -Sit and stand from a firm chair with a pillow in the seat-try not to use hands or 1 hand 6-10 reps. -Sidestepping at the kitchen counter 1 to 2 minutes -Ankle range of motion standing at the kitchen counter or walker dorsi and plantarflexion. -And heel raises at the kitchen counter 8-10 reps.  Patient preferred to do these at home did not had any falls in 3 to 6 months limited by back pain and using a walker and a cane as needed. Had his caretaker 5-6 times a week for 5 to 6 hours Patient recommended to follow-up with me in 3 weeks to reassess fall risk and how he is doing with home program. Patient in agreement.                                   Visit Diagnosis: Chronic  fatigue    Problem List Patient Active Problem List   Diagnosis Date Noted   Malignant neoplasm of upper lobe of left lung ([place]) 03/17/2022   Protein-calorie malnutrition, severe 02/27/2022   SIRS , probable sepsis (systemic inflammatory response syndrome) ([place]) 02/26/2022   Acute gastroenteritis  02/26/2022   History of prostate cancer s/p prostatectomy/history of multiple myeloma in remission 02/26/2022   Chronic prescription opiate use 02/26/2022   Adenocarcinoma of left lung, stage 4 (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[place]) 03/21/2021   Hypocalcemia 10/28/2020   Squamous cell skin cancer 05/01/2020   History of skin cancer 10/20/2018   Menopausal syndrome (hot flashes) 05/14/2017   History of multiple myeloma 10/19/2016   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/2016   Port catheter in place 03/16/2016   AK (actinic keratosis) 01/31/2016   Chronic kidney disease, stage III (moderate) ([place]) 12/13/2015   Acquired pancytopenia ([place]) 09/06/2015   Thrombocytopenia ([place]) 06/07/2015   Chronic back pain 06/07/2015   Leukopenia 08/20/2014   Neuropathy due to chemotherapeutic drug ([place]) 07/13/2014   Peripheral neuropathy 03/02/2013   Osteoporosis 10/28/2012   Anemia of chronic disease 07/11/2011   Multiple myeloma in relapse ([place]) 07/09/2011   Thrombocytopenia due to drugs 06/27/2011   HYPERTRIGLYCERIDEMIA 08/18/2007   Malignant neoplasm of prostate ([place]) 02/23/2007   OBESITY 11/18/2006   Essential hypertension, benign 11/18/2006    Rosalyn Gess, OTR/L,CLT 04/15/2022, 11:30 AM  [facility] [address] [place], [place], [address] Phone: [phone]   Fax:  [phone]  Name: [name] MRN: [id number] Date of Birth: [birth year]

## 2022-04-16 ENCOUNTER — Other Ambulatory Visit: Payer: Medicare Other

## 2022-04-20 ENCOUNTER — Telehealth: Payer: Self-pay | Admitting: *Deleted

## 2022-04-20 ENCOUNTER — Ambulatory Visit (HOSPITAL_COMMUNITY): Payer: Medicare Other

## 2022-04-20 NOTE — Telephone Encounter (Signed)
Pt stated that he has had episodes of diarrhea prior to starting [place] but recently noticed his diarrhea has worsened since receiving his most recent [place] treatment. States has 2-3 episodes of diarrhea each day but just started taking imodium today. Pt would like to know if Dr. B has any other recommendations. I advised pt to see how the imodium works today and to call me back in the morning with an update.

## 2022-04-21 NOTE — Telephone Encounter (Signed)
I reached out to patient. I offered pt apt in [facility]c today or tomorrow. Pt states that he would like to continue to monitor his symptoms for now. He would like Hayley to call him in the morning to see how is feeling.

## 2022-04-22 NOTE — Telephone Encounter (Signed)
Per pt his diarrhea has resolved and stated that he had 2 formed stools yesterday. Does not report any bowel movement today but states will call back if has any further episodes of diarrhea.

## 2022-04-23 ENCOUNTER — Other Ambulatory Visit: Payer: Medicare Other

## 2022-04-23 ENCOUNTER — Ambulatory Visit: Payer: Medicare Other | Admitting: Physician Assistant

## 2022-04-23 ENCOUNTER — Ambulatory Visit: Payer: Medicare Other

## 2022-04-23 ENCOUNTER — Encounter: Payer: Medicare Other | Admitting: Nutrition

## 2022-04-29 ENCOUNTER — Telehealth: Payer: Self-pay | Admitting: *Deleted

## 2022-04-29 ENCOUNTER — Encounter: Payer: Medicare Other | Admitting: Dietician

## 2022-04-29 NOTE — Telephone Encounter (Signed)
Pt is asking if he can receive the RSV and covid vaccine the day after his [place] treatment which is scheduled for 05/06/22. Please advise.

## 2022-04-30 ENCOUNTER — Other Ambulatory Visit: Payer: Medicare Other

## 2022-05-06 ENCOUNTER — Ambulatory Visit: Payer: Medicare Other | Admitting: Internal Medicine

## 2022-05-06 ENCOUNTER — Encounter: Payer: Self-pay | Admitting: Internal Medicine

## 2022-05-06 ENCOUNTER — Other Ambulatory Visit: Payer: Self-pay | Admitting: Internal Medicine

## 2022-05-06 ENCOUNTER — Inpatient Hospital Stay (HOSPITAL_BASED_OUTPATIENT_CLINIC_OR_DEPARTMENT_OTHER): Payer: Medicare Other | Admitting: Internal Medicine

## 2022-05-06 ENCOUNTER — Inpatient Hospital Stay: Payer: Medicare Other | Attending: Internal Medicine

## 2022-05-06 ENCOUNTER — Inpatient Hospital Stay: Payer: Medicare Other

## 2022-05-06 VITALS — BP 116/81 | HR 78 | Resp 16

## 2022-05-06 VITALS — BP 103/75 | HR 94 | Temp 96.3°F | Resp 16 | Wt 157.0 lb

## 2022-05-06 DIAGNOSIS — C9001 Multiple myeloma in remission: Secondary | ICD-10-CM | POA: Insufficient documentation

## 2022-05-06 DIAGNOSIS — C9002 Multiple myeloma in relapse: Secondary | ICD-10-CM

## 2022-05-06 DIAGNOSIS — C3412 Malignant neoplasm of upper lobe, left bronchus or lung: Secondary | ICD-10-CM

## 2022-05-06 DIAGNOSIS — J910 Malignant pleural effusion: Secondary | ICD-10-CM | POA: Insufficient documentation

## 2022-05-06 DIAGNOSIS — Z5112 Encounter for antineoplastic immunotherapy: Secondary | ICD-10-CM | POA: Insufficient documentation

## 2022-05-06 DIAGNOSIS — Z95828 Presence of other vascular implants and grafts: Secondary | ICD-10-CM

## 2022-05-06 DIAGNOSIS — C61 Malignant neoplasm of prostate: Secondary | ICD-10-CM | POA: Insufficient documentation

## 2022-05-06 DIAGNOSIS — Z5111 Encounter for antineoplastic chemotherapy: Secondary | ICD-10-CM | POA: Insufficient documentation

## 2022-05-06 DIAGNOSIS — Z79899 Other long term (current) drug therapy: Secondary | ICD-10-CM | POA: Diagnosis not present

## 2022-05-06 LAB — CBC WITH DIFFERENTIAL/PLATELET
Abs Immature Granulocytes: 0.1 10*3/uL — ABNORMAL HIGH (ref 0.00–0.07)
Basophils Absolute: 0.1 10*3/uL (ref 0.0–0.1)
Basophils Relative: 1 %
Eosinophils Absolute: 0.2 10*3/uL (ref 0.0–0.5)
Eosinophils Relative: 2 %
HCT: 35 % — ABNORMAL LOW (ref 39.0–52.0)
Hemoglobin: 11.9 g/dL — ABNORMAL LOW (ref 13.0–17.0)
Immature Granulocytes: 1 %
Lymphocytes Relative: 12 %
Lymphs Abs: 0.9 10*3/uL (ref 0.7–4.0)
MCH: 36.2 pg — ABNORMAL HIGH (ref 26.0–34.0)
MCHC: 34 g/dL (ref 30.0–36.0)
MCV: 106.4 fL — ABNORMAL HIGH (ref 80.0–100.0)
Monocytes Absolute: 1 10*3/uL (ref 0.1–1.0)
Monocytes Relative: 13 %
Neutro Abs: 5.4 10*3/uL (ref 1.7–7.7)
Neutrophils Relative %: 71 %
Platelets: 195 10*3/uL (ref 150–400)
RBC: 3.29 MIL/uL — ABNORMAL LOW (ref 4.22–5.81)
RDW: 12.5 % (ref 11.5–15.5)
WBC: 7.6 10*3/uL (ref 4.0–10.5)
nRBC: 0 % (ref 0.0–0.2)

## 2022-05-06 LAB — COMPREHENSIVE METABOLIC PANEL
ALT: 17 U/L (ref 0–44)
AST: 45 U/L — ABNORMAL HIGH (ref 15–41)
Albumin: 3.3 g/dL — ABNORMAL LOW (ref 3.5–5.0)
Alkaline Phosphatase: 47 U/L (ref 38–126)
Anion gap: 7 (ref 5–15)
BUN: 15 mg/dL (ref 8–23)
CO2: 26 mmol/L (ref 22–32)
Calcium: 8.9 mg/dL (ref 8.9–10.3)
Chloride: 103 mmol/L (ref 98–111)
Creatinine, Ser: 1.14 mg/dL (ref 0.61–1.24)
GFR, Estimated: >60 mL/min (ref >60)
Glucose, Bld: 126 mg/dL — ABNORMAL HIGH (ref 70–99)
Potassium: 3.2 mmol/L — ABNORMAL LOW (ref 3.5–5.1)
Sodium: 136 mmol/L (ref 135–145)
Total Bilirubin: 0.5 mg/dL (ref 0.3–1.2)
Total Protein: 7.3 g/dL (ref 6.5–8.1)

## 2022-05-06 LAB — TSH: TSH: 3.693 u[IU]/mL (ref 0.350–4.500)

## 2022-05-06 MED ORDER — HEPARIN SOD (PORK) LOCK FLUSH 100 UNIT/ML IV SOLN
INTRAVENOUS | Status: AC
  Administered 2022-05-06: 500 [IU]
  Filled 2022-05-06: qty 5

## 2022-05-06 MED ORDER — SODIUM CHLORIDE 0.9 % IV SOLN
Freq: Once | INTRAVENOUS | Status: AC
  Filled 2022-05-06: qty 250

## 2022-05-06 MED ORDER — POTASSIUM CHLORIDE CRYS ER 20 MEQ PO TBCR: EXTENDED_RELEASE_TABLET | ORAL | 3 refills | Status: DC

## 2022-05-06 MED ORDER — SODIUM CHLORIDE 0.9% FLUSH
10.0000 mL | INTRAVENOUS | Status: DC | PRN
  Administered 2022-05-06: 10 mL
  Filled 2022-05-06: qty 10

## 2022-05-06 MED ORDER — SODIUM CHLORIDE 0.9 % IV SOLN
200.0000 mg | Freq: Once | INTRAVENOUS | Status: AC
  Administered 2022-05-06: 200 mg via INTRAVENOUS
  Filled 2022-05-06: qty 8

## 2022-05-06 MED ORDER — PROCHLORPERAZINE MALEATE 10 MG PO TABS
10.0000 mg | ORAL_TABLET | Freq: Once | ORAL | Status: AC
  Administered 2022-05-06: 10 mg via ORAL
  Filled 2022-05-06: qty 1

## 2022-05-06 MED ORDER — HEPARIN SOD (PORK) LOCK FLUSH 100 UNIT/ML IV SOLN
500.0000 [IU] | Freq: Once | INTRAVENOUS | Status: AC | PRN
  Filled 2022-05-06: qty 5

## 2022-05-06 MED ORDER — LEUPROLIDE ACETATE (6 MONTH) 45 MG ~~LOC~~ KIT
45.0000 mg | PACK | Freq: Once | SUBCUTANEOUS | Status: AC
  Administered 2022-05-06: 45 mg via SUBCUTANEOUS
  Filled 2022-05-06: qty 45

## 2022-05-06 NOTE — Assessment & Plan Note (Addendum)
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  Extreme poor tolerance to status post cycle number 2 of carbo [facility] 26th, 2023]-hospital with sepsis. PDL-1 status-1%.  August 2023 CT scan-chest stable.  Currently on single agent Keytruda.  #Proceed with Keytruda cycle #3 today Labs today reviewed;  acceptable for treatment today. SEP 2023- TSH- WNL.  # Diarrhea- G-1- recommend immoidum prn. Monitor closely on immunotherapy.   # Hypokalemia- 3.2; recommd Kdur BID.   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 ELigard q 6 M; give today. Understand palliative nature of the treatment.    # Hx of Multiple Myeloma [2012]-  Remission- monitor for now.   # Pain- chronic- none oxycodone [constipation]; fentanyl patch -75 mcg- STABLE.   # IV ACCESS: port flush-   # DISPOSITION: # Eligard today # Keytruda today # follow up in 3 weeks- MD; labs- cbc/cmp; TSH; Keytruda- Dr.B

## 2022-05-06 NOTE — Patient Instructions (Signed)
[facility]  Discharge Instructions: Thank you for choosing [facility] to provide your oncology and hematology care.  If you have a lab appointment with the Cancer Center, please go directly to the Cancer Center and check in at the registration area.  Wear comfortable clothing and clothing appropriate for easy access to any Portacath or PICC line.   We strive to give you quality time with your provider. You may need to reschedule your appointment if you arrive late (15 or more minutes).  Arriving late affects you and other patients whose appointments are after yours.  Also, if you miss three or more appointments without notifying the office, you may be dismissed from the clinic at the provider's discretion.      For prescription refill requests, have your pharmacy contact our office and allow 72 hours for refills to be completed.    Today you received the following chemotherapy and/or immunotherapy agents Keytruda      To help prevent nausea and vomiting after your treatment, we encourage you to take your nausea medication as directed.  BELOW ARE SYMPTOMS THAT SHOULD BE REPORTED IMMEDIATELY: *FEVER GREATER THAN 100.4 F (38 C) OR HIGHER *CHILLS OR SWEATING *NAUSEA AND VOMITING THAT IS NOT CONTROLLED WITH YOUR NAUSEA MEDICATION *UNUSUAL SHORTNESS OF BREATH *UNUSUAL BRUISING OR BLEEDING *URINARY PROBLEMS (pain or burning when urinating, or frequent urination) *BOWEL PROBLEMS (unusual diarrhea, constipation, pain near the anus) TENDERNESS IN MOUTH AND THROAT WITH OR WITHOUT PRESENCE OF ULCERS (sore throat, sores in mouth, or a toothache) UNUSUAL RASH, SWELLING OR PAIN  UNUSUAL VAGINAL DISCHARGE OR ITCHING   Items with * indicate a potential emergency and should be followed up as soon as possible or go to the Emergency Department if any problems should occur.  Please show the CHEMOTHERAPY ALERT CARD or IMMUNOTHERAPY ALERT CARD at check-in to  the Emergency Department and triage nurse.  Should you have questions after your visit or need to cancel or reschedule your appointment, please contact [facility]  [phone] and follow the prompts.  Office hours are 8:00 a.m. to 4:30 p.m. Monday - Friday. Please note that voicemails left after 4:00 p.m. may not be returned until the following business day.  We are closed weekends and major holidays. You have access to a nurse at all times for urgent questions. Please call the main number to the clinic [phone] and follow the prompts.  For any non-urgent questions, you may also contact your provider using MyChart. We now offer e-Visits for anyone 18 and older to request care online for non-urgent symptoms. For details visit [email or web address].   Also download the MyChart app! Go to the app store, search "MyChart", open the app, select [facility], and log in with your MyChart username and password.  Masks are optional in the cancer centers. If you would like for your care team to wear a mask while they are taking care of you, please let them know. For doctor visits, patients may have with them one support person who is at least 76 years old. At this time, visitors are not allowed in the infusion area.   

## 2022-05-06 NOTE — Progress Notes (Signed)
Pt in for follow up, reports feeling much better and improved appetite.

## 2022-05-06 NOTE — Progress Notes (Signed)
[place] NOTE  Patient Care Team: Nicholas Lose, MD as PCP - General (Hematology and Oncology) Jeanann Lewandowsky, MD as Consulting Physician (Hematology and Oncology) Cathlean Marseilles, NP as Nurse Practitioner (Hematology and Oncology) Raynelle Bring, MD as Consulting Physician (Urology) Garrel Ridgel, [place] as Consulting Physician (Podiatry) Morton Stall, Howell Rucks, NP as Nurse Practitioner (Pain Medicine) Druscilla Brownie, MD as Consulting Physician (Dermatology) Bryson Ha, OD as Consulting Physician (Optometry) Nicholas Lose, MD as Consulting Physician (Hematology and Oncology) Telford Nab, RN as Oncology Nurse Navigator  CHIEF COMPLAINTS/PURPOSE OF CONSULTATION: Lung cancer  Oncology History Overview Note  Adenosquamous lung cancer  # MULTIPLE MYELOMA [Dr.Gorsuch]; s/p Auto-Transplant [[facility]]- remission.   # MAY 2023-   # The patient is currently undergoing systemic chemotherapy with carboplatin for AUC of 5, Alimta 500 Mg/M2 and Keytruda 200 Mg IV every 3 weeks status post 1 cycle started on January 29, 2022. [Dr.Mohammed; [place]]  #Prostate cancer prostatectomy 2003; multiple myeloma- 2012-remission   Malignant neoplasm of prostate ([place])  02/23/2007 Initial Diagnosis   Malignant neoplasm of prostate ([place])   10/28/2020 Cancer Staging   Staging form: Prostate, AJCC 6th Edition - Clinical: Stage III (T3, N0, M0) - Signed by Heath Lark, MD on 10/28/2020   11/24/2021 Procedure   Successful ultrasound guided diagnostic and therapeutic left thoracentesis yielding 750 cc of pleural fluid.     12/02/2021 PET scan   1. Hypermetabolic nodular areas of consolidation in the left lung with a hypermetabolic malignant left pleural effusion. Hypermetabolism extends inferiorly along the left hemidiaphragm. Associated hypermetabolic prepericardiac lymph nodes. Findings may be metastatic. Primary bronchogenic carcinoma cannot be excluded. 2. Uptake in the soft tissues of the  distal left foot with associated edema in the soft tissues. Please correlate clinically. 3. Cholelithiasis and choledocholithiasis. No biliary ductal dilatation. 4. Bilateral renal stones. 5. Aortic atherosclerosis (ICD10-I70.0). Coronary artery calcification.     12/18/2021 Procedure   Successful CT guided core needle core biopsy of LEFT lung mass, as above   Multiple myeloma in relapse ([place])  07/09/2011 Initial Diagnosis   Multiple myeloma in relapse ([place])   01/28/2022 - 02/19/2022 Chemotherapy   Patient is on Treatment Plan : LUNG Carboplatin (5) + Pemetrexed (500) + Pembrolizumab (200) D1 q21d Induction x 4 cycles / Maintenance Pemetrexed (500) + Pembrolizumab (200) D1 q21d     01/28/2022 -  Chemotherapy   Patient is on Treatment Plan : Pembrolizumab (200) D1 q21d     Lung cancer ([place])  12/02/2021 PET scan   1. Hypermetabolic nodular areas of consolidation in the left lung with a hypermetabolic malignant left pleural effusion. Hypermetabolism extends inferiorly along the left hemidiaphragm. Associated hypermetabolic prepericardiac lymph nodes. Findings may be metastatic. Primary bronchogenic carcinoma cannot be excluded. 2. Uptake in the soft tissues of the distal left foot with associated edema in the soft tissues. Please correlate clinically. 3. Cholelithiasis and choledocholithiasis. No biliary ductal dilatation. 4. Bilateral renal stones. 5. Aortic atherosclerosis (ICD10-I70.0). Coronary artery calcification.   12/18/2021 Pathology Results   A. LUNG MASS, LEFT, BIOPSY:  -  Adenocarcinoma  -  See comment  Six immunohistochemical stains were performed with adequate control. The adenocarcinoma is diffusely and strongly positive for cytokeratin 7 and the pulmonary adeno marker TTF-1.  The tumor is negative for the pulmonary adeno marker Napsin A.  The tumor does show a patchy focal positivity for the squamous marker p40 suggesting an adenosquamous differentiation to this tumor.  The tumor  is negative for  the prostate markers prostate-specific antigen and prostein.    12/23/2021 Initial Diagnosis   Lung cancer ([place])   12/23/2021 Cancer Staging   Staging form: Lung, AJCC 8th Edition - Clinical stage from 12/23/2021: Stage IVA (cT1c, cN2, cM1a) - Signed by Curt Bears, MD on 01/05/2022 Stage prefix: Initial diagnosis   01/01/2022 Imaging   MRI brain Negative for metastatic disease.   01/28/2022 - 02/19/2022 Chemotherapy   Patient is on Treatment Plan : LUNG Carboplatin (5) + Pemetrexed (500) + Pembrolizumab (200) D1 q21d Induction x 4 cycles / Maintenance Pemetrexed (500) + Pembrolizumab (200) D1 q21d     01/28/2022 -  Chemotherapy   Patient is on Treatment Plan : Pembrolizumab (200) D1 q21d     Malignant neoplasm of upper lobe of left lung ([place])  01/28/2022 -  Chemotherapy   Patient is on Treatment Plan : Pembrolizumab (200) D1 q21d     03/17/2022 Initial Diagnosis   Malignant neoplasm of upper lobe of left lung ([place])   03/17/2022 Cancer Staging   Staging form: Lung, AJCC 8th Edition - Clinical: Stage IVB (cT2, cN2, cM1c) - Signed by Cammie Sickle, MD on 03/17/2022     HISTORY OF PRESENTING ILLNESS: Patient in wheelchair.  Accompanied by his friend.   [name] 76 y.o.  male with multiple medical problems-including history of multiple myeloma in remission; Dx in May 2023- stage IV lung cancer adeno-squamous carcinoma currently single agent Keytruda [poor tolerance to combination chemo-immunotherapy].  Patient had that lasted for 2 to 3 days post last Keytruda infusion.  Resolved with Imodium.  No use of any steroids.  Pt in for follow up, reports feeling much better and improved appetite.     Review of Systems  Constitutional:  Positive for malaise/fatigue and weight loss. Negative for chills, diaphoresis and fever.  HENT:  Negative for nosebleeds and sore throat.   Eyes:  Negative for double vision.  Respiratory:  Negative for cough, hemoptysis, sputum  production, shortness of breath and wheezing.   Cardiovascular:  Negative for chest pain, palpitations, orthopnea and leg swelling.  Gastrointestinal:  Negative for abdominal pain, blood in stool, constipation, diarrhea, heartburn, melena, nausea and vomiting.  Genitourinary:  Negative for dysuria, frequency and urgency.  Musculoskeletal:  Positive for back pain and joint pain.  Skin: Negative.  Negative for itching and rash.  Neurological:  Positive for weakness. Negative for dizziness, tingling, focal weakness and headaches.  Endo/Heme/Allergies:  Does not bruise/bleed easily.  Psychiatric/Behavioral:  Negative for depression. The patient is not nervous/anxious and does not have insomnia.     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 Leonie Man)   CATARACT EXTRACTION W/ INTRAOCULAR LENS IMPLANT Bilateral    DEXA  05/2011   spine 0.7, L femur 0.0, R femur -0.6, normal   KIDNEY STONE SURGERY     KIDNEY STONE SURGERY  06/2009   Stone removed from bladder   MELANOMA EXCISION Left 06/05/2019   Procedure: REEXCISION MELANOMA OF LEFT POSTERIOR AURICLE;  Surgeon:  Johnathan Hausen, MD;  Location: [facility];  Service: General;  Laterality: Left;   PORTACATH PLACEMENT  08/05/11   right sided portacath   PROSTATECTOMY  04/06/07   TOE AMPUTATION Left 2017   2nd toe left foot    SOCIAL HISTORY: Social History   Socioeconomic History   Marital status: Single    Spouse name: Not on file   Number of children: Not on file   Years of education: Not on file   Highest education level: Not on file  Occupational History   Occupation: Sales    Comment: [employer] [place]  Tobacco Use   Smoking status: Former    Packs/day: 2.00    Years: 30.00    Total pack years: 60.00    Types: Cigarettes    Quit date: 07/27/1996    Years since quitting: 25.7   Smokeless tobacco: Never  Vaping Use   Vaping Use: Never used  Substance and Sexual Activity   Alcohol use: No   Drug use: No   Sexual activity: Never  Other Topics Concern   Not on file  Social History Narrative   [place]- closer to [place]; condos; [wife with stroke- better]. Quit smoking 1998; no alcohol.    Social Determinants of Health   Financial Resource Strain: Not on file  Food Insecurity: Not on file  Transportation Needs: Not on file  Physical Activity: Not on file  Stress: Not on file  Social Connections: Not on file  Intimate Partner Violence: Not on file    FAMILY HISTORY: Family History  Problem Relation Age of Onset   Pneumonia Mother    Stroke Father    Cancer Brother        lung   Kidney cancer Sister    Colon cancer Neg Hx     ALLERGIES:  is allergic to celebrex [celecoxib], cymbalta [duloxetine hcl], hydromorphone hcl, meloxicam, oxycontin [oxycodone], percocet [oxycodone-acetaminophen], vicodin [hydrocodone-acetaminophen], and lyrica [pregabalin].  MEDICATIONS:  Current Outpatient Medications  Medication Sig Dispense Refill   aspirin 81 MG tablet Take 81 mg by mouth daily.     B Complex Vitamins (VITAMIN-B COMPLEX) TABS Take 1 tablet by  mouth daily.     Cyanocobalamin (VITAMIN B 12 PO) Take 1,000 mg by mouth daily.     fentaNYL (DURAGESIC) 75 MCG/HR Place 1 patch onto the skin every other day.     lidocaine-prilocaine (EMLA) cream Apply topically as needed. Apply to port site one hour before treatment and cover with plastic wrap 30 g 2   pantoprazole (PROTONIX) 40 MG tablet Take 1 tablet (40 mg total) by mouth daily. 90 tablet 3   potassium chloride SA (KLOR-CON M) 20 MEQ tablet 1 pill twice a day 60 tablet 3   Calcium Carbonate-Vitamin D 600-400 MG-UNIT per tablet Take 1 tablet by mouth daily. (Patient not taking: Reported on 03/17/2022)     cholecalciferol (VITAMIN D) 1000 UNITS tablet Take 1,000 Units by mouth daily. (Patient not taking: Reported on 12/26/930)     folic acid (  FOLVITE) 1 MG tablet Take 1 tablet (1 mg total) by mouth daily. (Patient not taking: Reported on 03/17/2022) 30 tablet 4   mirtazapine (REMERON) 15 MG tablet Take 15 mg by mouth at bedtime. (Patient not taking: Reported on 03/17/2022)     NONFORMULARY OR COMPOUNDED ITEM Baclofn/amitrp/ketamn cream topically to feet TID prn for neuropathy (Patient not taking: Reported on 03/17/2022)     ondansetron (ZOFRAN) 4 MG tablet Take 4 mg by mouth every 8 (eight) hours as needed. (Patient not taking: Reported on 03/17/2022)     Oxycodone HCl 10 MG TABS Take 1-2 tablets by mouth 3 (three) times daily as needed. (Patient not taking: Reported on 04/15/2022)     prochlorperazine (COMPAZINE) 10 MG tablet Take 1 tablet (10 mg total) by mouth every 6 (six) hours as needed for nausea or vomiting. (Patient not taking: Reported on 03/17/2022) 30 tablet 0   No current facility-administered medications for this visit.    PHYSICAL EXAMINATION:  Vitals:   05/06/22 0945  BP: 103/75  Pulse: 94  Resp: 16  Temp: (!) 96.3 F (35.7 C)  SpO2: 100%   Filed Weights   05/06/22 0945  Weight: 157 lb (71.2 kg)    Physical Exam Vitals and nursing note reviewed.  HENT:     Head:  Normocephalic and atraumatic.     Mouth/Throat:     Pharynx: Oropharynx is clear.  Eyes:     Extraocular Movements: Extraocular movements intact.     Pupils: Pupils are equal, round, and reactive to light.  Cardiovascular:     Rate and Rhythm: Normal rate and regular rhythm.  Pulmonary:     Comments: Decreased breath sounds bilaterally.  Abdominal:     Palpations: Abdomen is soft.  Musculoskeletal:        General: Normal range of motion.     Cervical back: Normal range of motion.  Skin:    General: Skin is warm.  Neurological:     General: No focal deficit present.     Mental Status: He is alert and oriented to person, place, and time.  Psychiatric:        Behavior: Behavior normal.        Judgment: Judgment normal.    LABORATORY DATA:  I have reviewed the data as listed Lab Results  Component Value Date   WBC 7.6 05/06/2022   HGB 11.9 (L) 05/06/2022   HCT 35.0 (L) 05/06/2022   MCV 106.4 (H) 05/06/2022   PLT 195 05/06/2022   Recent Labs    03/17/22 1427 04/15/22 0926 05/06/22 0916  NA 136 136 136  K 3.0* 2.7* 3.2*  CL 100 102 103  CO2 _0 GLUCOSE 133* 141* 126*  BUN _1 CREATININE 1.11 0.88 1.14  CALCIUM 8.7* 8.7* 8.9  GFRNONAA >60 >60 >60  PROT 6.6 7.0 7.3  ALBUMIN 3.0* 3.3* 3.3*  AST 25 34 45*  ALT _2 ALKPHOS 45 40 47  BILITOT 0.5 0.6 0.5    RADIOGRAPHIC STUDIES: I have personally reviewed the radiological images as listed and agreed with the findings in the report. No results found.   # May 2023- Stage IV (T1c, N2, M a) non-small cell lung cancer, adenocarcinoma presented with left lingular nodule in addition to pleural nodularity and thickening throughout the left hemothorax as well as malignant left pleural effusion and pericardiac lymphadenopathy diagnosed in  Molecular studies by foundation 1 showed no actionable mutations.  Extreme poor tolerance to  status post cycle number 2 of carbo Alimta [facility] 26th, 2023]-hospital  with sepsis. PDL-1 status-1%.  August 2023 CT scan-chest stable.  Currently on single agent Keytruda.  #Proceed with Keytruda cycle #3 today Labs today reviewed;  acceptable for treatment today. SEP 2023- TSH- WNL.  # Diarrhea- G-1- recommend immoidum prn. Monitor closely on immunotherapy.   # Hypokalemia- 3.2; recommd Kdur BID.   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 ELigard q 6 M; give today. Understand palliative nature of the treatment.    # Hx of Multiple Myeloma [2012]-  Remission- monitor for now.   # Pain- chronic- none oxycodone [constipation]; fentanyl patch -75 mcg- STABLE.   # IV ACCESS: port flush-   # DISPOSITION: # Eligard today # Keytruda today # follow up in 3 weeks- MD; labs- cbc/cmp; TSH; Keytruda- Dr.B      Cammie Sickle, MD 05/06/2022 10:24 AM

## 2022-05-07 ENCOUNTER — Encounter: Payer: Self-pay | Admitting: *Deleted

## 2022-05-07 ENCOUNTER — Other Ambulatory Visit: Payer: Medicare Other

## 2022-05-07 LAB — T4: T4, Total: 6.6 ug/dL (ref 4.5–12.0)

## 2022-05-14 ENCOUNTER — Other Ambulatory Visit: Payer: Medicare Other

## 2022-05-14 ENCOUNTER — Ambulatory Visit: Payer: Medicare Other

## 2022-05-14 ENCOUNTER — Ambulatory Visit: Payer: Medicare Other | Admitting: Internal Medicine

## 2022-05-20 ENCOUNTER — Inpatient Hospital Stay: Payer: Medicare Other

## 2022-05-20 ENCOUNTER — Other Ambulatory Visit: Payer: Self-pay | Admitting: *Deleted

## 2022-05-20 ENCOUNTER — Telehealth: Payer: Self-pay | Admitting: *Deleted

## 2022-05-20 ENCOUNTER — Inpatient Hospital Stay (HOSPITAL_BASED_OUTPATIENT_CLINIC_OR_DEPARTMENT_OTHER): Payer: Medicare Other | Admitting: Hospice and Palliative Medicine

## 2022-05-20 VITALS — BP 102/80 | HR 110 | Temp 96.0°F | Resp 17

## 2022-05-20 DIAGNOSIS — C3412 Malignant neoplasm of upper lobe, left bronchus or lung: Secondary | ICD-10-CM

## 2022-05-20 DIAGNOSIS — Z5112 Encounter for antineoplastic immunotherapy: Secondary | ICD-10-CM | POA: Diagnosis not present

## 2022-05-20 DIAGNOSIS — R1013 Epigastric pain: Secondary | ICD-10-CM | POA: Diagnosis not present

## 2022-05-20 DIAGNOSIS — E860 Dehydration: Secondary | ICD-10-CM

## 2022-05-20 LAB — COMPREHENSIVE METABOLIC PANEL
ALT: 19 U/L (ref 0–44)
AST: 40 U/L (ref 15–41)
Albumin: 3.5 g/dL (ref 3.5–5.0)
Alkaline Phosphatase: 54 U/L (ref 38–126)
Anion gap: 9 (ref 5–15)
BUN: 20 mg/dL (ref 8–23)
CO2: 24 mmol/L (ref 22–32)
Calcium: 9.3 mg/dL (ref 8.9–10.3)
Chloride: 100 mmol/L (ref 98–111)
Creatinine, Ser: 1.32 mg/dL — ABNORMAL HIGH (ref 0.61–1.24)
GFR, Estimated: 56 mL/min — ABNORMAL LOW (ref >60)
Glucose, Bld: 128 mg/dL — ABNORMAL HIGH (ref 70–99)
Potassium: 4.4 mmol/L (ref 3.5–5.1)
Sodium: 133 mmol/L — ABNORMAL LOW (ref 135–145)
Total Bilirubin: 0.9 mg/dL (ref 0.3–1.2)
Total Protein: 8.4 g/dL — ABNORMAL HIGH (ref 6.5–8.1)

## 2022-05-20 LAB — CBC WITH DIFFERENTIAL/PLATELET
Abs Immature Granulocytes: 0.15 10*3/uL — ABNORMAL HIGH (ref 0.00–0.07)
Basophils Absolute: 0 10*3/uL (ref 0.0–0.1)
Basophils Relative: 0 %
Eosinophils Absolute: 0 10*3/uL (ref 0.0–0.5)
Eosinophils Relative: 0 %
HCT: 40.6 % (ref 39.0–52.0)
Hemoglobin: 13.7 g/dL (ref 13.0–17.0)
Immature Granulocytes: 1 %
Lymphocytes Relative: 5 %
Lymphs Abs: 1.3 10*3/uL (ref 0.7–4.0)
MCH: 35.5 pg — ABNORMAL HIGH (ref 26.0–34.0)
MCHC: 33.7 g/dL (ref 30.0–36.0)
MCV: 105.2 fL — ABNORMAL HIGH (ref 80.0–100.0)
Monocytes Absolute: 3.2 10*3/uL — ABNORMAL HIGH (ref 0.1–1.0)
Monocytes Relative: 14 %
Neutro Abs: 18.4 10*3/uL — ABNORMAL HIGH (ref 1.7–7.7)
Neutrophils Relative %: 80 %
Platelets: 225 10*3/uL (ref 150–400)
RBC: 3.86 MIL/uL — ABNORMAL LOW (ref 4.22–5.81)
RDW: 12 % (ref 11.5–15.5)
WBC: 23.1 10*3/uL — ABNORMAL HIGH (ref 4.0–10.5)
nRBC: 0 % (ref 0.0–0.2)

## 2022-05-20 LAB — MAGNESIUM: Magnesium: 1.7 mg/dL (ref 1.7–2.4)

## 2022-05-20 MED ORDER — SODIUM CHLORIDE 0.9 % IV SOLN
Freq: Once | INTRAVENOUS | Status: AC
  Filled 2022-05-20: qty 250

## 2022-05-20 MED ORDER — HEPARIN SOD (PORK) LOCK FLUSH 100 UNIT/ML IV SOLN
500.0000 [IU] | Freq: Once | INTRAVENOUS | Status: AC
  Administered 2022-05-20: 500 [IU] via INTRAVENOUS
  Filled 2022-05-20: qty 5

## 2022-05-20 MED ORDER — SODIUM CHLORIDE 0.9% FLUSH
10.0000 mL | Freq: Once | INTRAVENOUS | Status: AC
  Administered 2022-05-20: 10 mL via INTRAVENOUS
  Filled 2022-05-20: qty 10

## 2022-05-20 NOTE — Progress Notes (Addendum)
[facility] [place] [facility] [phone] [phone]  Patient Care Team: Nicholas Lose, MD as PCP - General (Hematology and Oncology) Jeanann Lewandowsky, MD as Consulting Physician (Hematology and Oncology) Cathlean Marseilles, NP as Nurse Practitioner (Hematology and Oncology) Raynelle Bring, MD as Consulting Physician (Urology) Garrel Ridgel, DPM as Consulting Physician (Podiatry) Morton Stall, Howell Rucks, NP as Nurse Practitioner (Pain Medicine) Druscilla Brownie, MD as Consulting Physician (Dermatology) Bryson Ha, OD as Consulting Physician (Optometry) Nicholas Lose, MD as Consulting Physician (Hematology and Oncology) Telford Nab, RN as Oncology Nurse Navigator   NAME OF PATIENT: [name]  [id number]  [birth year]   DATE OF Boyer: 05/20/22  REASON FOR CONSULT: [name] is a 76 y.o. male with multiple medical problems including multiple myeloma in remission, history of prostate cancer status post prostatectomy in 2003, stage IV lung cancer with poor tolerance to chemotherapy, most recently on single agent Keytruda.   INTERVAL HISTORY: Patient last saw Dr. Rogue Bussing on 05/06/2022 at which time patient received cycle 3 Keytruda.  Patient presents to clinic today for evaluation of 3 days of right upper quadrant pain, primarily with coughing.  Patient says about the same time.  He developed a dry cough but denies shortness of breath, rhinorrhea, or sore throat.  He denies any abdominal distention, nausea, vomiting, diarrhea, or constipation.  Reportedly, oral intake has been poor over the past several days.  No fever or chills.  Denies any neurologic complaints. Denies recent fevers or illnesses. Denies any easy bleeding or bruising.  Denies chest pain. Denies any nausea, vomiting, constipation, or diarrhea. Denies urinary complaints. Patient offers no further specific complaints today.   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 Leonie Man)   CATARACT EXTRACTION W/ INTRAOCULAR LENS IMPLANT Bilateral    DEXA  05/2011   spine 0.7, L femur 0.0, R femur -0.6, normal   KIDNEY STONE SURGERY     KIDNEY STONE SURGERY  06/2009   Stone removed from bladder   MELANOMA EXCISION Left 06/05/2019   Procedure: REEXCISION MELANOMA OF LEFT POSTERIOR AURICLE;  Surgeon: Johnathan Hausen, MD;  Location: [facility];  Service: General;  Laterality: Left;   PORTACATH PLACEMENT  08/05/11   right sided portacath   PROSTATECTOMY  04/06/07   TOE AMPUTATION Left 2017   2nd toe left foot    HEMATOLOGY/ONCOLOGY HISTORY:  Oncology History Overview Note  Adenosquamous lung cancer  # MULTIPLE MYELOMA [Dr.Gorsuch]; s/p Auto-Transplant [[facility]]- remission.   # MAY 2023-   #  The patient is currently undergoing systemic chemotherapy with carboplatin for AUC of 5, Alimta 500 Mg/M2 and Keytruda 200 Mg IV every 3 weeks status post 1 cycle started on January 29, 2022. [Dr.Mohammed; [place]]  #Prostate cancer prostatectomy 2003; multiple myeloma- 2012-remission   Malignant  neoplasm of prostate ([place])  02/23/2007 Initial Diagnosis   Malignant neoplasm of prostate ([place])   10/28/2020 Cancer Staging   Staging form: Prostate, AJCC 6th Edition - Clinical: Stage III (T3, N0, M0) - Signed by Heath Lark, MD on 10/28/2020   11/24/2021 Procedure   Successful ultrasound guided diagnostic and therapeutic left thoracentesis yielding 750 cc of pleural fluid.     12/02/2021 PET scan   1. Hypermetabolic nodular areas of consolidation in the left lung with a hypermetabolic malignant left pleural effusion. Hypermetabolism extends inferiorly along the left hemidiaphragm. Associated hypermetabolic prepericardiac lymph nodes. Findings may be metastatic. Primary bronchogenic carcinoma cannot be excluded. 2. Uptake in the soft tissues of the distal left foot with associated edema in the soft tissues. Please correlate clinically. 3. Cholelithiasis and choledocholithiasis. No biliary ductal dilatation. 4. Bilateral renal stones. 5. Aortic atherosclerosis (ICD10-I70.0). Coronary artery calcification.     12/18/2021 Procedure   Successful CT guided core needle core biopsy of LEFT lung mass, as above   Multiple myeloma in relapse ([place])  07/09/2011 Initial Diagnosis   Multiple myeloma in relapse ([place])   01/28/2022 - 02/19/2022 Chemotherapy   Patient is on Treatment Plan : LUNG Carboplatin (5) + Pemetrexed (500) + Pembrolizumab (200) D1 q21d Induction x 4 cycles / Maintenance Pemetrexed (500) + Pembrolizumab (200) D1 q21d     01/28/2022 -  Chemotherapy   Patient is on Treatment Plan : Pembrolizumab (200) D1 q21d     Lung cancer ([place])  12/02/2021 PET scan   1. Hypermetabolic nodular areas of consolidation in the left lung with a hypermetabolic malignant left pleural effusion. Hypermetabolism extends inferiorly along the left hemidiaphragm. Associated hypermetabolic prepericardiac lymph nodes. Findings may be metastatic. Primary bronchogenic carcinoma cannot be excluded. 2. Uptake in the soft  tissues of the distal left foot with associated edema in the soft tissues. Please correlate clinically. 3. Cholelithiasis and choledocholithiasis. No biliary ductal dilatation. 4. Bilateral renal stones. 5. Aortic atherosclerosis (ICD10-I70.0). Coronary artery calcification.   12/18/2021 Pathology Results   A. LUNG MASS, LEFT, BIOPSY:  -  Adenocarcinoma  -  See comment  Six immunohistochemical stains were performed with adequate control. The adenocarcinoma is diffusely and strongly positive for cytokeratin 7 and the pulmonary adeno marker TTF-1.  The tumor is negative for the pulmonary adeno marker Napsin A.  The tumor does show a patchy focal positivity for the squamous marker p40 suggesting an adenosquamous differentiation to this tumor.  The tumor is negative for the prostate markers prostate-specific antigen and prostein.    12/23/2021 Initial Diagnosis   Lung cancer ([place])   12/23/2021 Cancer Staging   Staging form: Lung, AJCC 8th Edition - Clinical stage from 12/23/2021: Stage IVA (cT1c, cN2, cM1a) - Signed by Curt Bears, MD on 01/05/2022 Stage prefix: Initial diagnosis   01/01/2022 Imaging   MRI brain Negative for metastatic disease.   01/28/2022 - 02/19/2022 Chemotherapy   Patient is on Treatment Plan : LUNG Carboplatin (5) + Pemetrexed (500) + Pembrolizumab (200) D1 q21d Induction x 4 cycles / Maintenance Pemetrexed (500) + Pembrolizumab (200) D1 q21d     01/28/2022 -  Chemotherapy   Patient is on Treatment Plan : Pembrolizumab (200) D1 q21d  Malignant neoplasm of upper lobe of left lung (HCC)  01/28/2022 -  Chemotherapy   Patient is on Treatment Plan : Pembrolizumab (200) D1 q21d     03/17/2022 Initial Diagnosis   Malignant neoplasm of upper lobe of left lung (HCC)   03/17/2022 Cancer Staging   Staging form: Lung, AJCC 8th Edition - Clinical: Stage IVB (cT2, cN2, cM1c) - Signed by Earna Coder, MD on 03/17/2022     ALLERGIES:  is allergic to celebrex [celecoxib],  cymbalta [duloxetine hcl], hydromorphone hcl, meloxicam, oxycontin [oxycodone], percocet [oxycodone-acetaminophen], vicodin [hydrocodone-acetaminophen], and lyrica [pregabalin].  MEDICATIONS:  Current Outpatient Medications  Medication Sig Dispense Refill   aspirin 81 MG tablet Take 81 mg by mouth daily.     B Complex Vitamins (VITAMIN-B COMPLEX) TABS Take 1 tablet by mouth daily.     Calcium Carbonate-Vitamin D 600-400 MG-UNIT per tablet Take 1 tablet by mouth daily. (Patient not taking: Reported on 03/17/2022)     cholecalciferol (VITAMIN D) 1000 UNITS tablet Take 1,000 Units by mouth daily. (Patient not taking: Reported on 03/17/2022)     Cyanocobalamin (VITAMIN B 12 PO) Take 1,000 mg by mouth daily.     fentaNYL (DURAGESIC) 75 MCG/HR Place 1 patch onto the skin every other day.     folic acid (FOLVITE) 1 MG tablet Take 1 tablet (1 mg total) by mouth daily. (Patient not taking: Reported on 03/17/2022) 30 tablet 4   lidocaine-prilocaine (EMLA) cream Apply topically as needed. Apply to port site one hour before treatment and cover with plastic wrap 30 g 2   mirtazapine (REMERON) 15 MG tablet Take 15 mg by mouth at bedtime. (Patient not taking: Reported on 03/17/2022)     NONFORMULARY OR COMPOUNDED ITEM Baclofn/amitrp/ketamn cream topically to feet TID prn for neuropathy (Patient not taking: Reported on 03/17/2022)     ondansetron (ZOFRAN) 4 MG tablet Take 4 mg by mouth every 8 (eight) hours as needed. (Patient not taking: Reported on 03/17/2022)     Oxycodone HCl 10 MG TABS Take 1-2 tablets by mouth 3 (three) times daily as needed. (Patient not taking: Reported on 04/15/2022)     pantoprazole (PROTONIX) 40 MG tablet Take 1 tablet (40 mg total) by mouth daily. 90 tablet 3   potassium chloride SA (KLOR-CON M) 20 MEQ tablet 1 pill twice a day 60 tablet 3   prochlorperazine (COMPAZINE) 10 MG tablet Take 1 tablet (10 mg total) by mouth every 6 (six) hours as needed for nausea or vomiting. (Patient not  taking: Reported on 03/17/2022) 30 tablet 0   No current facility-administered medications for this Boyer.    VITAL SIGNS: BP 102/80 (BP Location: Left Arm, Patient Position: Sitting)   Pulse (!) 110   Temp (!) 96 F (35.6 C) (Tympanic)   Resp 17   SpO2 97%  There were no vitals filed for this Boyer.  Estimated body mass index is 20.16 kg/m as calculated from the following:   Height as of 03/17/22: 6\' 2"  (1.88 m).   Weight as of 05/06/22: 157 lb (71.2 kg).  LABS: CBC:    Component Value Date/Time   WBC 7.6 05/06/2022 0916   HGB 11.9 (L) 05/06/2022 0916   HGB 10.1 (L) 02/19/2022 0800   HGB 13.2 05/14/2017 1145   HCT 35.0 (L) 05/06/2022 0916   HCT 39.2 05/14/2017 1145   PLT 195 05/06/2022 0916   PLT 227 02/19/2022 0800   PLT 120 (L) 05/14/2017 1145   MCV 106.4 (H) 05/06/2022 07/06/2022  MCV 103.9 (H) 05/14/2017 1145   NEUTROABS 5.4 05/06/2022 0916   NEUTROABS 3.6 05/14/2017 1145   LYMPHSABS 0.9 05/06/2022 0916   LYMPHSABS 0.9 05/14/2017 1145   MONOABS 1.0 05/06/2022 0916   MONOABS 0.9 05/14/2017 1145   EOSABS 0.2 05/06/2022 0916   EOSABS 0.4 05/14/2017 1145   EOSABS 0.5 09/30/2013 0945   BASOSABS 0.1 05/06/2022 0916   BASOSABS 0.1 05/14/2017 1145   Comprehensive Metabolic Panel:    Component Value Date/Time   NA 136 05/06/2022 0916   NA 139 05/14/2017 1145   K 3.2 (L) 05/06/2022 0916   K 3.8 05/14/2017 1145   CL 103 05/06/2022 0916   CL 108 (H) 09/30/2013 0945   CL 110 (H) 12/29/2012 1151   CO2 26 05/06/2022 0916   CO2 27 05/14/2017 1145   BUN 15 05/06/2022 0916   BUN 17.9 05/14/2017 1145   CREATININE 1.14 05/06/2022 0916   CREATININE 1.10 02/19/2022 0800   CREATININE 1.3 05/14/2017 1145   GLUCOSE 126 (H) 05/06/2022 0916   GLUCOSE 109 05/14/2017 1145   GLUCOSE 127 (H) 12/29/2012 1151   CALCIUM 8.9 05/06/2022 0916   CALCIUM 9.3 05/14/2017 1145   AST 45 (H) 05/06/2022 0916   AST 23 02/19/2022 0800   AST 19 05/14/2017 1145   ALT 17 05/06/2022 0916   ALT 13  02/19/2022 0800   ALT 18 05/14/2017 1145   ALKPHOS 47 05/06/2022 0916   ALKPHOS 45 05/14/2017 1145   BILITOT 0.5 05/06/2022 0916   BILITOT 0.4 02/19/2022 0800   BILITOT 0.67 05/14/2017 1145   PROT 7.3 05/06/2022 0916   PROT 6.2 05/14/2017 1145   PROT 6.6 05/14/2017 1145   ALBUMIN 3.3 (L) 05/06/2022 0916   ALBUMIN 3.3 (L) 05/14/2017 1145    RADIOGRAPHIC STUDIES: No results found.  PERFORMANCE STATUS (ECOG) : 3 - Symptomatic, >50% confined to bed  Review of Systems Unless otherwise noted, a complete review of systems is negative.  Physical Exam General: NAD Cardiovascular: regular rate and rhythm Pulmonary: clear ant fields Abdomen: soft, nontender, + bowel sounds GU: no suprapubic tenderness Extremities: no edema, no joint deformities Skin: no rashes Neurological: Weakness but otherwise nonfocal  IMPRESSION/PLAN: RUQ abdominal pain -unclear etiology.  LFTs normal.  Nontoxic with benign exam. Patient does have leukocytosis which could reflect infectious etiology but unclear.  Cancer could be another possibility.  Will add on UA/culture despite lack of GU symptoms. Discussed with Dr. Rogue Bussing and will proceed with CT of abdomen and pelvis without contrast.  We will also obtain chest x-ray given new onset cough. Will await results of imaging and workup to assist with clinical decision making.   Dehydration -proceed with IV fluids.  Will have RN call tomorrow to check on patient and see if he needs to be seen sooner next week. ED triggers reviewed.   Addendum: UA unchanged from baseline. Not specifically suggestive of infection. However, given leukocytosis and abd pain, will start empiric coverage with Cipro x 5 days. Patient has not yet obtained imaging.   Patient expressed understanding and was in agreement with this plan. He also understands that He can call clinic at any time with any questions, concerns, or complaints.   Thank you for allowing me to participate in the  care of this very pleasant patient.   Time Total: 25 minutes  Boyer consisted of counseling and education dealing with the complex and emotionally intense issues of symptom management in the setting of serious illness.Greater than 50%  of this  time was spent counseling and coordinating care related to the above assessment and plan.  Signed by: Altha Harm, PhD, NP-C

## 2022-05-20 NOTE — Telephone Encounter (Signed)
this is a Dr. B patient and I just got off the phone with his caregiver who stated that pt is not feeling well today - symptoms started Monday evening with right sided abd pain, nausea, diarrhea, decreased appetite. He is alert but caregiver said that he is not himself today. Do you want to see him in [facility]? [name] says that he can come today with labs, [facility] and poss. Fluids. I sent message to

## 2022-05-21 ENCOUNTER — Other Ambulatory Visit: Payer: Self-pay

## 2022-05-21 ENCOUNTER — Other Ambulatory Visit: Payer: Medicare Other

## 2022-05-21 ENCOUNTER — Telehealth: Payer: Self-pay | Admitting: Hospice and Palliative Medicine

## 2022-05-21 ENCOUNTER — Other Ambulatory Visit: Payer: Self-pay | Admitting: *Deleted

## 2022-05-21 DIAGNOSIS — C3412 Malignant neoplasm of upper lobe, left bronchus or lung: Secondary | ICD-10-CM

## 2022-05-21 DIAGNOSIS — Z5112 Encounter for antineoplastic immunotherapy: Secondary | ICD-10-CM | POA: Diagnosis not present

## 2022-05-21 LAB — URINALYSIS, COMPLETE (UACMP) WITH MICROSCOPIC
Bilirubin Urine: NEGATIVE
Glucose, UA: NEGATIVE mg/dL
Ketones, ur: NEGATIVE mg/dL
Leukocytes,Ua: NEGATIVE
Nitrite: NEGATIVE
Protein, ur: 30 mg/dL — AB
Specific Gravity, Urine: 1.02 (ref 1.005–1.030)
Squamous Epithelial / HPF: NONE SEEN (ref 0–5)
pH: 5 (ref 5.0–8.0)

## 2022-05-21 NOTE — Telephone Encounter (Signed)
Secure chat sent to CT department to assist with scheduling CT scan

## 2022-05-21 NOTE — Addendum Note (Signed)
Addended by: Irean Hong on: 05/21/2022 09:46 AM   Modules accepted: Orders

## 2022-05-22 ENCOUNTER — Ambulatory Visit
Admission: RE | Admit: 2022-05-22 | Discharge: 2022-05-22 | Disposition: A | Discharge status: Home / Self Care | Payer: Medicare Other | Source: Ambulatory Visit | Attending: Hospice and Palliative Medicine | Admitting: Hospice and Palliative Medicine

## 2022-05-22 ENCOUNTER — Ambulatory Visit
Admission: RE | Admit: 2022-05-22 | Discharge: 2022-05-22 | Disposition: A | Discharge status: Home / Self Care | Payer: Medicare Other | Attending: Hospice and Palliative Medicine | Admitting: Hospice and Palliative Medicine

## 2022-05-22 ENCOUNTER — Inpatient Hospital Stay (HOSPITAL_BASED_OUTPATIENT_CLINIC_OR_DEPARTMENT_OTHER): Payer: Medicare Other | Admitting: Nurse Practitioner

## 2022-05-22 ENCOUNTER — Other Ambulatory Visit: Payer: Self-pay

## 2022-05-22 ENCOUNTER — Inpatient Hospital Stay: Payer: Medicare Other

## 2022-05-22 VITALS — BP 110/72 | HR 87 | Temp 98.5°F | Resp 18

## 2022-05-22 DIAGNOSIS — R197 Diarrhea, unspecified: Secondary | ICD-10-CM

## 2022-05-22 DIAGNOSIS — Z5112 Encounter for antineoplastic immunotherapy: Secondary | ICD-10-CM | POA: Diagnosis not present

## 2022-05-22 DIAGNOSIS — C61 Malignant neoplasm of prostate: Secondary | ICD-10-CM

## 2022-05-22 DIAGNOSIS — J90 Pleural effusion, not elsewhere classified: Secondary | ICD-10-CM | POA: Insufficient documentation

## 2022-05-22 DIAGNOSIS — R1010 Upper abdominal pain, unspecified: Secondary | ICD-10-CM

## 2022-05-22 DIAGNOSIS — C3412 Malignant neoplasm of upper lobe, left bronchus or lung: Secondary | ICD-10-CM

## 2022-05-22 DIAGNOSIS — R059 Cough, unspecified: Secondary | ICD-10-CM | POA: Diagnosis not present

## 2022-05-22 DIAGNOSIS — Z859 Personal history of malignant neoplasm, unspecified: Secondary | ICD-10-CM

## 2022-05-22 DIAGNOSIS — R7989 Other specified abnormal findings of blood chemistry: Secondary | ICD-10-CM | POA: Diagnosis not present

## 2022-05-22 DIAGNOSIS — J9811 Atelectasis: Secondary | ICD-10-CM | POA: Diagnosis not present

## 2022-05-22 DIAGNOSIS — E860 Dehydration: Secondary | ICD-10-CM

## 2022-05-22 DIAGNOSIS — Z95828 Presence of other vascular implants and grafts: Secondary | ICD-10-CM

## 2022-05-22 LAB — COMPREHENSIVE METABOLIC PANEL
ALT: 29 U/L (ref 0–44)
AST: 68 U/L — ABNORMAL HIGH (ref 15–41)
Albumin: 3 g/dL — ABNORMAL LOW (ref 3.5–5.0)
Alkaline Phosphatase: 86 U/L (ref 38–126)
Anion gap: 9 (ref 5–15)
BUN: 28 mg/dL — ABNORMAL HIGH (ref 8–23)
CO2: 24 mmol/L (ref 22–32)
Calcium: 8.9 mg/dL (ref 8.9–10.3)
Chloride: 99 mmol/L (ref 98–111)
Creatinine, Ser: 1.31 mg/dL — ABNORMAL HIGH (ref 0.61–1.24)
GFR, Estimated: 56 mL/min — ABNORMAL LOW (ref >60)
Glucose, Bld: 113 mg/dL — ABNORMAL HIGH (ref 70–99)
Potassium: 3.8 mmol/L (ref 3.5–5.1)
Sodium: 132 mmol/L — ABNORMAL LOW (ref 135–145)
Total Bilirubin: 0.9 mg/dL (ref 0.3–1.2)
Total Protein: 7.5 g/dL (ref 6.5–8.1)

## 2022-05-22 LAB — URINE CULTURE: Culture: NO GROWTH

## 2022-05-22 LAB — MAGNESIUM: Magnesium: 1.9 mg/dL (ref 1.7–2.4)

## 2022-05-22 LAB — CBC WITH DIFFERENTIAL/PLATELET
Abs Immature Granulocytes: 0.09 10*3/uL — ABNORMAL HIGH (ref 0.00–0.07)
Basophils Absolute: 0 10*3/uL (ref 0.0–0.1)
Basophils Relative: 0 %
Eosinophils Absolute: 0 10*3/uL (ref 0.0–0.5)
Eosinophils Relative: 0 %
HCT: 34 % — ABNORMAL LOW (ref 39.0–52.0)
Hemoglobin: 11.6 g/dL — ABNORMAL LOW (ref 13.0–17.0)
Immature Granulocytes: 1 %
Lymphocytes Relative: 7 %
Lymphs Abs: 1 10*3/uL (ref 0.7–4.0)
MCH: 35.8 pg — ABNORMAL HIGH (ref 26.0–34.0)
MCHC: 34.1 g/dL (ref 30.0–36.0)
MCV: 104.9 fL — ABNORMAL HIGH (ref 80.0–100.0)
Monocytes Absolute: 2.3 10*3/uL — ABNORMAL HIGH (ref 0.1–1.0)
Monocytes Relative: 17 %
Neutro Abs: 10 10*3/uL — ABNORMAL HIGH (ref 1.7–7.7)
Neutrophils Relative %: 75 %
Platelets: 169 10*3/uL (ref 150–400)
RBC: 3.24 MIL/uL — ABNORMAL LOW (ref 4.22–5.81)
RDW: 11.9 % (ref 11.5–15.5)
WBC: 13.4 10*3/uL — ABNORMAL HIGH (ref 4.0–10.5)
nRBC: 0 % (ref 0.0–0.2)

## 2022-05-22 LAB — PSA: Prostatic Specific Antigen: 29.36 ng/mL — ABNORMAL HIGH (ref 0.00–4.00)

## 2022-05-22 MED ORDER — HEPARIN SOD (PORK) LOCK FLUSH 100 UNIT/ML IV SOLN
500.0000 [IU] | Freq: Once | INTRAVENOUS | Status: AC
  Administered 2022-05-22: 500 [IU] via INTRAVENOUS
  Filled 2022-05-22: qty 5

## 2022-05-22 MED ORDER — CIPROFLOXACIN HCL 500 MG PO TABS: 500.0000 mg | ORAL_TABLET | Freq: Two times a day (BID) | ORAL | 0 refills | Status: DC

## 2022-05-22 MED ORDER — SODIUM CHLORIDE 0.9% FLUSH
10.0000 mL | Freq: Once | INTRAVENOUS | Status: AC
  Administered 2022-05-22: 10 mL via INTRAVENOUS
  Filled 2022-05-22: qty 10

## 2022-05-22 MED ORDER — SODIUM CHLORIDE 0.9 % IV SOLN
INTRAVENOUS | Status: DC
  Filled 2022-05-22: qty 250

## 2022-05-22 NOTE — Addendum Note (Signed)
Addended by: Irean Hong on: 05/22/2022 07:45 AM   Modules accepted: Orders

## 2022-05-22 NOTE — Progress Notes (Signed)
Sympt[facility] [address] [place], [place] [address] [phone] (phone) [phone] (fax)  Patient Care Team: Nicholas Lose, MD as PCP - General (Hematology and Oncology) Jeanann Lewandowsky, MD as Consulting Physician (Hematology and Oncology) Cathlean Marseilles, NP as Nurse Practitioner (Hematology and Oncology) Raynelle Bring, MD as Consulting Physician (Urology) Garrel Ridgel, DPM as Consulting Physician (Podiatry) Morton Stall, Howell Rucks, NP as Nurse Practitioner (Pain Medicine) Druscilla Brownie, MD as Consulting Physician (Dermatology) Bryson Ha, OD as Consulting Physician (Optometry) Nicholas Lose, MD as Consulting Physician (Hematology and Oncology) Telford Nab, RN as Oncology Nurse Navigator   Name of the patient: [name]  [id number]  07-27-1946   Date of visit: 05/22/22  Diagnosis- Lung Cancer  Chief complaint/ Reason for visit- Abdominal pain & diarrhea  Heme/Onc history:  Oncology History Overview Note  Adenosquamous lung cancer  # MULTIPLE MYELOMA [Dr.Gorsuch]; s/p Auto-Transplant [[facility]]- remission.   # MAY 2023-   # The patient is currently undergoing systemic chemotherapy with carboplatin for AUC of 5, Alimta 500 Mg/M2 and Keytruda 200 Mg IV every 3 weeks status post 1 cycle started on January 29, 2022. [Dr.Mohammed; [place]]  #Prostate cancer prostatectomy 2003; multiple myeloma- 2012-remission   Malignant neoplasm of prostate ([place])  02/23/2007 Initial Diagnosis   Malignant neoplasm of prostate ([place])   10/28/2020 Cancer Staging   Staging form: Prostate, AJCC 6th Edition - Clinical: Stage III (T3, N0, M0) - Signed by Heath Lark, MD on 10/28/2020   11/24/2021 Procedure   Successful ultrasound guided diagnostic and therapeutic left thoracentesis yielding 750 cc of pleural fluid.     12/02/2021 PET scan   1. Hypermetabolic  nodular areas of consolidation in the left lung with a hypermetabolic malignant left pleural effusion. Hypermetabolism extends inferiorly along the left hemidiaphragm. Associated hypermetabolic prepericardiac lymph nodes. Findings may be metastatic. Primary bronchogenic carcinoma cannot be excluded. 2. Uptake in the soft tissues of the distal left foot with associated edema in the soft tissues. Please correlate clinically. 3. Cholelithiasis and choledocholithiasis. No biliary ductal dilatation. 4. Bilateral renal stones. 5. Aortic atherosclerosis (ICD10-I70.0). Coronary artery calcification.     12/18/2021 Procedure   Successful CT guided core needle core biopsy of LEFT lung mass, as above   Multiple myeloma in relapse ([place])  07/09/2011 Initial Diagnosis   Multiple myeloma in relapse ([place])   01/28/2022 - 02/19/2022 Chemotherapy   Patient is on Treatment Plan : LUNG Carboplatin (5) + Pemetrexed (500) + Pembrolizumab (200) D1 q21d Induction x 4 cycles / Maintenance Pemetrexed (500) + Pembrolizumab (200) D1 q21d     01/28/2022 -  Chemotherapy   Patient is on Treatment Plan : Pembrolizumab (200) D1 q21d     Lung cancer ([place])  12/02/2021 PET scan   1. Hypermetabolic nodular areas of consolidation in the left lung with a hypermetabolic malignant left pleural effusion. Hypermetabolism extends inferiorly along the left hemidiaphragm. Associated hypermetabolic prepericardiac lymph nodes. Findings may be metastatic. Primary bronchogenic carcinoma cannot be excluded. 2. Uptake in the soft tissues of the distal left foot with associated edema in the soft tissues. Please correlate clinically. 3. Cholelithiasis and choledocholithiasis. No biliary ductal dilatation. 4. Bilateral renal stones. 5. Aortic atherosclerosis (ICD10-I70.0). Coronary artery calcification.   12/18/2021 Pathology Results   A. LUNG MASS, LEFT, BIOPSY:  -  Adenocarcinoma  -  See comment  Six immunohistochemical stains were performed  with adequate  control. The adenocarcinoma is diffusely and strongly positive for cytokeratin 7 and the pulmonary adeno marker TTF-1.  The tumor is negative for the pulmonary adeno marker Napsin A.  The tumor does show a patchy focal positivity for the squamous marker p40 suggesting an adenosquamous differentiation to this tumor.  The tumor is negative for the prostate markers prostate-specific antigen and prostein.    12/23/2021 Initial Diagnosis   Lung cancer ([place])   12/23/2021 Cancer Staging   Staging form: Lung, AJCC 8th Edition - Clinical stage from 12/23/2021: Stage IVA (cT1c, cN2, cM1a) - Signed by Curt Bears, MD on 01/05/2022 Stage prefix: Initial diagnosis   01/01/2022 Imaging   MRI brain Negative for metastatic disease.   01/28/2022 - 02/19/2022 Chemotherapy   Patient is on Treatment Plan : LUNG Carboplatin (5) + Pemetrexed (500) + Pembrolizumab (200) D1 q21d Induction x 4 cycles / Maintenance Pemetrexed (500) + Pembrolizumab (200) D1 q21d     01/28/2022 -  Chemotherapy   Patient is on Treatment Plan : Pembrolizumab (200) D1 q21d     Malignant neoplasm of upper lobe of left lung ([place])  01/28/2022 -  Chemotherapy   Patient is on Treatment Plan : Pembrolizumab (200) D1 q21d     03/17/2022 Initial Diagnosis   Malignant neoplasm of upper lobe of left lung ([place])   03/17/2022 Cancer Staging   Staging form: Lung, AJCC 8th Edition - Clinical: Stage IVB (cT2, cN2, cM1c) - Signed by Cammie Sickle, MD on 03/17/2022     Interval history-patient is 76 year old male with above history of lung, prostate, and multiple myeloma currently receiving single agent Keytruda for lung cancer presents to symptom management clinic for follow-up for cough and abdominal pain.  He was seen by Merrily Pew [place] earlier this.  Reports that since then, cough has resolved. He complains of upper abdominal pain. Reports diarrhea x 2.  \ Review of systems- Review of Systems  Constitutional:  Positive for  malaise/fatigue and weight loss. Negative for chills and fever.  HENT:  Negative for hearing loss, nosebleeds, sore throat and tinnitus.   Eyes:  Negative for blurred vision and double vision.  Respiratory:  Negative for cough, hemoptysis, shortness of breath and wheezing.   Cardiovascular:  Negative for chest pain, palpitations and leg swelling.  Gastrointestinal:  Positive for abdominal pain. Negative for blood in stool, constipation, diarrhea, melena, nausea and vomiting.  Genitourinary:  Negative for dysuria and urgency.  Musculoskeletal:  Negative for back pain, falls, joint pain and myalgias.  Skin:  Negative for itching and rash.  Neurological:  Positive for weakness. Negative for dizziness, tingling, sensory change, loss of consciousness and headaches.  Endo/Heme/Allergies:  Negative for environmental allergies. Does not bruise/bleed easily.  Psychiatric/Behavioral:  Negative for depression. The patient is not nervous/anxious and does not have insomnia.      Current treatment- keytruda  Allergies  Allergen Reactions   Celebrex [Celecoxib] Nausea Only   Cymbalta [Duloxetine Hcl] Other (See Comments) and Hives    Weight loss Weight loss   Hydromorphone Hcl Itching   Meloxicam Itching   Oxycontin [Oxycodone] Itching    Also caused constipation, insomnia Also caused constipation, insomnia Also caused constipation, insomnia   Percocet [Oxycodone-Acetaminophen] Itching   Vicodin [Hydrocodone-Acetaminophen] Itching   Lyrica [Pregabalin] Rash    Lesions    Past Medical History:  Diagnosis Date   Allergic rhinitis    Anemia 07/2010   secondary to tx rsponsive to [facility]   Blood transfusion without reported diagnosis 2012, 2013  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   DDD (degenerative disc disease)    Fractures    compression T12,L3   GERD (gastroesophageal reflux disease)    History of chemotherapy    weekly Velcade,Cytoxan    HTN (hypertension)    Hypercalcemia of malignancy    Hyperlipemia    Multiple myeloma 07/09/2011   Multiple myeloma    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 Leonie Man)   CATARACT EXTRACTION W/ INTRAOCULAR LENS IMPLANT Bilateral    DEXA  05/2011   spine 0.7, L femur 0.0, R femur -0.6, normal   KIDNEY STONE SURGERY     KIDNEY STONE SURGERY  06/2009   Stone removed from bladder   MELANOMA EXCISION Left 06/05/2019   Procedure: REEXCISION MELANOMA OF LEFT POSTERIOR AURICLE;  Surgeon: Johnathan Hausen, MD;  Location: [facility];  Service: General;  Laterality: Left;   PORTACATH PLACEMENT  08/05/11   right sided portacath   PROSTATECTOMY  04/06/07   TOE AMPUTATION Left 2017   2nd toe left foot    Social History   Socioeconomic History   Marital status: Single    Spouse name: Not on file   Number of children: Not on file   Years of education: Not on file   Highest education level: Not on file  Occupational History   Occupation: Sales    Comment: [employer] [place] [employer]  Tobacco Use   Smoking status: Former    Packs/day: 2.00    Years: 30.00    Total pack years: 60.00    Types: Cigarettes    Quit date: 07/27/1996    Years since quitting: 25.8   Smokeless tobacco: Never  Vaping Use   Vaping Use: Never used  Substance and Sexual Activity   Alcohol use: No   Drug use: No   Sexual activity: Never  Other Topics Concern   Not on file  Social History Narrative   [place]- closer to [place]; condos; [wife with stroke- better]. Quit smoking 1998; no alcohol.    Social Determinants of Health   Financial Resource Strain: Not on file  Food Insecurity: Not on file  Transportation Needs: Not on file   Physical Activity: Not on file  Stress: Not on file  Social Connections: Not on file  Intimate Partner Violence: Not on file    Family History  Problem Relation Age of Onset   Pneumonia Mother    Stroke Father    Cancer Brother        lung   Kidney cancer Sister    Colon cancer Neg Hx      Current Outpatient Medications:    aspirin 81 MG tablet, Take 81 mg by mouth daily., Disp: , Rfl:    B Complex Vitamins (VITAMIN-B COMPLEX) TABS, Take 1 tablet by mouth daily., Disp: , Rfl:    Calcium Carbonate-Vitamin D 600-400 MG-UNIT per tablet, Take 1 tablet by mouth daily. (Patient not taking: Reported on 03/17/2022), Disp: , Rfl:    cholecalciferol (VITAMIN D) 1000 UNITS tablet, Take 1,000 Units by mouth daily. (Patient not taking: Reported on 03/17/2022), Disp: , Rfl:    ciprofloxacin (CIPRO) 500  MG tablet, Take 1 tablet (500 mg total) by mouth 2 (two) times daily., Disp: 10 tablet, Rfl: 0   Cyanocobalamin (VITAMIN B 12 PO), Take 1,000 mg by mouth daily., Disp: , Rfl:    fentaNYL (DURAGESIC) 75 MCG/HR, Place 1 patch onto the skin every other day., Disp: , Rfl:    folic acid (FOLVITE) 1 MG tablet, Take 1 tablet (1 mg total) by mouth daily. (Patient not taking: Reported on 03/17/2022), Disp: 30 tablet, Rfl: 4   lidocaine-prilocaine (EMLA) cream, Apply topically as needed. Apply to port site one hour before treatment and cover with plastic wrap, Disp: 30 g, Rfl: 2   mirtazapine (REMERON) 15 MG tablet, Take 15 mg by mouth at bedtime. (Patient not taking: Reported on 03/17/2022), Disp: , Rfl:    NONFORMULARY OR COMPOUNDED ITEM, Baclofn/amitrp/ketamn cream topically to feet TID prn for neuropathy (Patient not taking: Reported on 03/17/2022), Disp: , Rfl:    ondansetron (ZOFRAN) 4 MG tablet, Take 4 mg by mouth every 8 (eight) hours as needed. (Patient not taking: Reported on 03/17/2022), Disp: , Rfl:    Oxycodone HCl 10 MG TABS, Take 1-2 tablets by mouth 3 (three) times daily as needed. (Patient not  taking: Reported on 04/15/2022), Disp: , Rfl:    pantoprazole (PROTONIX) 40 MG tablet, Take 1 tablet (40 mg total) by mouth daily., Disp: 90 tablet, Rfl: 3   potassium chloride SA (KLOR-CON M) 20 MEQ tablet, 1 pill twice a day, Disp: 60 tablet, Rfl: 3   prochlorperazine (COMPAZINE) 10 MG tablet, Take 1 tablet (10 mg total) by mouth every 6 (six) hours as needed for nausea or vomiting. (Patient not taking: Reported on 03/17/2022), Disp: 30 tablet, Rfl: 0  Current Facility-Administered Medications:    0.9 %  sodium chloride infusion, , Intravenous, Continuous, Verlon Au, NP  Physical exam:  Vitals:   05/22/22 1318  BP: 110/72  Pulse: 87  Resp: 18  Temp: 98.5 F (36.9 C)  TempSrc: Tympanic  SpO2: 98%   Physical Exam Constitutional:      General: He is not in acute distress.    Appearance: He is well-developed.     Comments: Frail appearing. Accompanied.   HENT:     Head: Normocephalic and atraumatic.  Eyes:     General: No scleral icterus. Cardiovascular:     Rate and Rhythm: Normal rate and regular rhythm.     Heart sounds: Normal heart sounds.  Pulmonary:     Effort: Pulmonary effort is normal.     Breath sounds: Normal breath sounds. No wheezing.  Abdominal:     General: Bowel sounds are normal. There is no distension.     Palpations: Abdomen is soft. There is no mass.     Tenderness: There is abdominal tenderness (RUQ mild tenderness to deep palpation. No reproducible pain elsewhere.). There is no guarding or rebound.  Musculoskeletal:        General: Normal range of motion.  Skin:    General: Skin is warm and dry.     Coloration: Skin is pale.  Neurological:     Mental Status: He is alert and oriented to person, place, and time.  Psychiatric:        Mood and Affect: Mood normal.        Behavior: Behavior normal.         Latest Ref Rng & Units 05/22/2022    1:10 PM  CMP  Glucose 70 - 99 mg/dL 113   BUN 8 -  23 mg/dL 28   Creatinine 0.61 - 1.24 mg/dL 1.31    Sodium 135 - 145 mmol/L 132   Potassium 3.5 - 5.1 mmol/L 3.8   Chloride 98 - 111 mmol/L 99   CO2 22 - 32 mmol/L 24   Calcium 8.9 - 10.3 mg/dL 8.9   Total Protein 6.5 - 8.1 g/dL 7.5   Total Bilirubin 0.3 - 1.2 mg/dL 0.9   Alkaline Phos 38 - 126 U/L 86   AST 15 - 41 U/L 68   ALT 0 - 44 U/L 29       Latest Ref Rng & Units 05/22/2022    1:10 PM  CBC  WBC 4.0 - 10.5 K/uL 13.4   Hemoglobin 13.0 - 17.0 g/dL 11.6   Hematocrit 39.0 - 52.0 % 34.0   Platelets 150 - 400 K/uL 169     No images are attached to the encounter.  DG Chest 2 View  Result Date: 05/22/2022 CLINICAL DATA:  Cough, history of lung cancer. EXAM: CHEST - 2 VIEW COMPARISON:  February 25, 2022. FINDINGS: The heart size and mediastinal contours are within normal limits. Left-sided pacemaker is unchanged in position. Right lung is clear. Stable left pleural effusion is noted with probable associated pleural thickening and left basilar atelectasis or scarring. The visualized skeletal structures are unremarkable. IMPRESSION: Stable left pleural effusion and pleural thickening is noted with associated left basilar atelectasis or scarring. Electronically Signed   By: Marijo Conception M.D.   On: 05/22/2022 10:23    Assessment and plan- Patient is a 76 y.o. male with lung cancer, poor tolerance to chemotherapy, currently on single agent keytruda, who returns to symptom management clinic for follow up of upper abdominal pain, cough, diarrhea. Cough has resolved. Pain is stable and unchanged. He now reports diarrhea x 2 days. Etiology unclear. Nontoxic appearing. Pain reproducible to deep palpation. UA negative. Chest xray negative for infection but does have some pleural effusion. Cough has now resolved. He has not started cipro but I will d/c this. Diarrhea is mild and not infectious appearing. In setting of [place], colitis possible though clinically, less likely. Trial imodium. Encouraged hydration. Tumor markers (hx of prostate cancer &  MM) have been elevated and I will recheck today. Clinically concerned for progression of disease. He previously had small subcentimeter liver lesion. Plan for CT abd/pelvis next week however, will be without contrast due to ckd. May limit assessment of liver. Today, Declined iv fluids today. He will follow upw ith Dr. Rogue Bussing for results of CT as scheduled or rtc sooner if symptoms not improved.    Visit Diagnosis 1. Dehydration   2. Malignant neoplasm of upper lobe of left lung [facility])     Patient expressed understanding and was in agreement with this plan. He also understands that He can call clinic at any time with any questions, concerns, or complaints.   Thank you for allowing me to participate in the care of this very pleasant patient.   Beckey Rutter, DNP, AGNP-C [place] at [place]

## 2022-05-25 ENCOUNTER — Ambulatory Visit
Admission: RE | Admit: 2022-05-25 | Discharge: 2022-05-25 | Disposition: A | Discharge status: Home / Self Care | Payer: Medicare Other | Source: Ambulatory Visit | Attending: Hematology and Oncology | Admitting: Hematology and Oncology

## 2022-05-25 ENCOUNTER — Encounter (INDEPENDENT_AMBULATORY_CARE_PROVIDER_SITE_OTHER): Payer: Self-pay

## 2022-05-25 ENCOUNTER — Emergency Department: Payer: Medicare Other

## 2022-05-25 ENCOUNTER — Telehealth: Payer: Self-pay | Admitting: *Deleted

## 2022-05-25 DIAGNOSIS — K828 Other specified diseases of gallbladder: Secondary | ICD-10-CM | POA: Diagnosis present

## 2022-05-25 DIAGNOSIS — R54 Age-related physical debility: Secondary | ICD-10-CM | POA: Diagnosis present

## 2022-05-25 DIAGNOSIS — Z85828 Personal history of other malignant neoplasm of skin: Secondary | ICD-10-CM

## 2022-05-25 DIAGNOSIS — Z8546 Personal history of malignant neoplasm of prostate: Secondary | ICD-10-CM

## 2022-05-25 DIAGNOSIS — Z79891 Long term (current) use of opiate analgesic: Secondary | ICD-10-CM

## 2022-05-25 DIAGNOSIS — K810 Acute cholecystitis: Secondary | ICD-10-CM | POA: Diagnosis not present

## 2022-05-25 DIAGNOSIS — E785 Hyperlipidemia, unspecified: Secondary | ICD-10-CM | POA: Diagnosis present

## 2022-05-25 DIAGNOSIS — C3490 Malignant neoplasm of unspecified part of unspecified bronchus or lung: Secondary | ICD-10-CM | POA: Diagnosis present

## 2022-05-25 DIAGNOSIS — Z87442 Personal history of urinary calculi: Secondary | ICD-10-CM

## 2022-05-25 DIAGNOSIS — C9001 Multiple myeloma in remission: Secondary | ICD-10-CM | POA: Diagnosis present

## 2022-05-25 DIAGNOSIS — D638 Anemia in other chronic diseases classified elsewhere: Secondary | ICD-10-CM | POA: Diagnosis present

## 2022-05-25 DIAGNOSIS — C3412 Malignant neoplasm of upper lobe, left bronchus or lung: Secondary | ICD-10-CM | POA: Insufficient documentation

## 2022-05-25 DIAGNOSIS — I10 Essential (primary) hypertension: Secondary | ICD-10-CM | POA: Diagnosis present

## 2022-05-25 DIAGNOSIS — E46 Unspecified protein-calorie malnutrition: Secondary | ICD-10-CM | POA: Diagnosis present

## 2022-05-25 DIAGNOSIS — K219 Gastro-esophageal reflux disease without esophagitis: Secondary | ICD-10-CM | POA: Diagnosis present

## 2022-05-25 DIAGNOSIS — R7401 Elevation of levels of liver transaminase levels: Secondary | ICD-10-CM | POA: Diagnosis present

## 2022-05-25 DIAGNOSIS — C786 Secondary malignant neoplasm of retroperitoneum and peritoneum: Secondary | ICD-10-CM | POA: Diagnosis present

## 2022-05-25 DIAGNOSIS — K660 Peritoneal adhesions (postprocedural) (postinfection): Secondary | ICD-10-CM | POA: Diagnosis present

## 2022-05-25 DIAGNOSIS — I452 Bifascicular block: Secondary | ICD-10-CM | POA: Diagnosis present

## 2022-05-25 DIAGNOSIS — R1010 Upper abdominal pain, unspecified: Secondary | ICD-10-CM

## 2022-05-25 DIAGNOSIS — Z87891 Personal history of nicotine dependence: Secondary | ICD-10-CM

## 2022-05-25 DIAGNOSIS — Z9079 Acquired absence of other genital organ(s): Secondary | ICD-10-CM

## 2022-05-25 DIAGNOSIS — K8063 Calculus of gallbladder and bile duct with acute cholecystitis with obstruction: Secondary | ICD-10-CM | POA: Diagnosis not present

## 2022-05-25 DIAGNOSIS — Z8051 Family history of malignant neoplasm of kidney: Secondary | ICD-10-CM

## 2022-05-25 DIAGNOSIS — Z6820 Body mass index (BMI) 20.0-20.9, adult: Secondary | ICD-10-CM

## 2022-05-25 DIAGNOSIS — Z823 Family history of stroke: Secondary | ICD-10-CM

## 2022-05-25 DIAGNOSIS — Z85820 Personal history of malignant melanoma of skin: Secondary | ICD-10-CM

## 2022-05-25 DIAGNOSIS — I2510 Atherosclerotic heart disease of native coronary artery without angina pectoris: Secondary | ICD-10-CM | POA: Diagnosis present

## 2022-05-25 DIAGNOSIS — K8020 Calculus of gallbladder without cholecystitis without obstruction: Secondary | ICD-10-CM

## 2022-05-25 LAB — CBC
HCT: 35.8 % — ABNORMAL LOW (ref 39.0–52.0)
Hemoglobin: 12.3 g/dL — ABNORMAL LOW (ref 13.0–17.0)
MCH: 34.7 pg — ABNORMAL HIGH (ref 26.0–34.0)
MCHC: 34.4 g/dL (ref 30.0–36.0)
MCV: 101.1 fL — ABNORMAL HIGH (ref 80.0–100.0)
Platelets: 183 10*3/uL (ref 150–400)
RBC: 3.54 MIL/uL — ABNORMAL LOW (ref 4.22–5.81)
RDW: 11.9 % (ref 11.5–15.5)
WBC: 5.5 10*3/uL (ref 4.0–10.5)
nRBC: 0 % (ref 0.0–0.2)

## 2022-05-25 LAB — COMPREHENSIVE METABOLIC PANEL
ALT: 66 U/L — ABNORMAL HIGH (ref 0–44)
AST: 109 U/L — ABNORMAL HIGH (ref 15–41)
Albumin: 3 g/dL — ABNORMAL LOW (ref 3.5–5.0)
Alkaline Phosphatase: 118 U/L (ref 38–126)
Anion gap: 8 (ref 5–15)
BUN: 20 mg/dL (ref 8–23)
CO2: 23 mmol/L (ref 22–32)
Calcium: 9.1 mg/dL (ref 8.9–10.3)
Chloride: 106 mmol/L (ref 98–111)
Creatinine, Ser: 1.03 mg/dL (ref 0.61–1.24)
GFR, Estimated: >60 mL/min (ref >60)
Glucose, Bld: 126 mg/dL — ABNORMAL HIGH (ref 70–99)
Potassium: 3.7 mmol/L (ref 3.5–5.1)
Sodium: 137 mmol/L (ref 135–145)
Total Bilirubin: 0.6 mg/dL (ref 0.3–1.2)
Total Protein: 7.5 g/dL (ref 6.5–8.1)

## 2022-05-25 LAB — LIPASE, BLOOD: Lipase: 31 U/L (ref 11–51)

## 2022-05-25 NOTE — ED Provider Triage Note (Signed)
Emergency Medicine Provider Triage Evaluation Note  BRAE GARTMAN , a 76 y.o. male  was evaluated in triage.  Pt sent to the ER by PCP after imaging today indicates gallstones and labs show elevated liver function tests. Patient states he had been having upper abdominal pain and was sent for a CT then called by PCP to come to the ER.  Physical Exam  BP 116/71 (BP Location: Left Arm)   Pulse 95   Temp 97.7 F (36.5 C) (Oral)   Resp 18   SpO2 95%  Gen:   Awake, no distress   Resp:  Normal effort  MSK:   Moves extremities without difficulty  Other:    Medical Decision Making  Medically screening exam initiated at 2:39 PM.  Appropriate orders placed.  CHRISTOHER DRUDGE was informed that the remainder of the evaluation will be completed by another provider, this initial triage assessment does not replace that evaluation, and the importance of remaining in the ED until their evaluation is complete.     Victorino Dike, FNP 05/25/22 1828

## 2022-05-25 NOTE — Telephone Encounter (Signed)
Called report  IMPRESSION: Gallbladder is distended. Stones are noted in the lumen of gallbladder, neck of gallbladder and distal common bile duct. There is pericholecystic stranding. Findings suggest acute cholecystitis. Follow-up gallbladder sonogram and surgical consultation should be considered.   There is no evidence of intestinal obstruction or pneumoperitoneum. There is no hydronephrosis.   Bilateral renal stones.  Left renal cyst.   Loculated effusion is seen in the posterior left lower lung fields. Coronary artery calcifications are seen. Scarring is seen in the lower lung fields.   Other findings as described in the body of the report.   These results will be called to the ordering clinician or representative by the Radiologist Assistant, and communication documented in the PACS or [employer].     Electronically Signed   By: Elmer Picker M.D.   On: 05/25/2022 09:19

## 2022-05-25 NOTE — Telephone Encounter (Signed)
47- RN spoke with Per Merrily Pew, NP. NP requested stat u/s complete abd and urgent referral to surgeon. Referral entered to Dr. Hampton Abbot. I called patient to discuss this referral. I left a vm on his home line and then reached pt via his cell number at 10 am. Patient is currently asymptomatic.  He is out shopping with his family. He states that today is the best that he has "felt in several days. I'm eating/drinking normal and I am not having solid stools." Patient last ate at 830 am today and request local surgeon (no practice preference) and the u/s to be set up tomorrow if possible. Advised patient at the time of this call to go to the ER if symptoms worsen.  At 1025- received a message from [place], NP and Dr. Hampton Abbot to have patient to to the ER as pt has cholecystitis but also choledocholithiasis and pt would need ERCP.  1040-I attempted to reach patient and both phone numbers. No answer. I left a detailed vm on pt's celll phone to call our office back asap.

## 2022-05-25 NOTE — Telephone Encounter (Signed)
Contacted patient and his wife. Explained the need for ERCP and emergency room visit for cholecystitis and choledocholithiasis. Patient and wife agreeable. They will go to the the ER.

## 2022-05-25 NOTE — ED Triage Notes (Signed)
Per pt, his pcp sent him here for gallstones. Per Pt they are suppose to put a scope down my throat and look at by gallbladder. Pt is a Ca pt of lung Ca. Pt is suppose to get chemo on Wednesday.

## 2022-05-26 ENCOUNTER — Encounter: Payer: Self-pay | Admitting: Internal Medicine

## 2022-05-26 ENCOUNTER — Inpatient Hospital Stay
Admission: EM | Admit: 2022-05-26 | Discharge: 2022-05-29 | DRG: 418 | Disposition: A | Discharge status: Home / Self Care | Payer: Medicare Other | Source: Ambulatory Visit | Attending: Internal Medicine | Admitting: Internal Medicine

## 2022-05-26 ENCOUNTER — Other Ambulatory Visit: Payer: Self-pay

## 2022-05-26 ENCOUNTER — Inpatient Hospital Stay: Payer: Medicare Other | Admitting: Certified Registered Nurse Anesthetist

## 2022-05-26 ENCOUNTER — Telehealth: Payer: Self-pay | Admitting: Internal Medicine

## 2022-05-26 ENCOUNTER — Inpatient Hospital Stay: Payer: Medicare Other

## 2022-05-26 ENCOUNTER — Other Ambulatory Visit: Payer: Self-pay | Admitting: Internal Medicine

## 2022-05-26 ENCOUNTER — Encounter
Admission: EM | Disposition: A | Discharge status: Home / Self Care | Payer: Self-pay | Source: Home / Self Care | Attending: Internal Medicine

## 2022-05-26 DIAGNOSIS — Z85828 Personal history of other malignant neoplasm of skin: Secondary | ICD-10-CM | POA: Diagnosis not present

## 2022-05-26 DIAGNOSIS — I452 Bifascicular block: Secondary | ICD-10-CM | POA: Diagnosis present

## 2022-05-26 DIAGNOSIS — Z9079 Acquired absence of other genital organ(s): Secondary | ICD-10-CM | POA: Diagnosis not present

## 2022-05-26 DIAGNOSIS — Z6820 Body mass index (BMI) 20.0-20.9, adult: Secondary | ICD-10-CM | POA: Diagnosis not present

## 2022-05-26 DIAGNOSIS — Z87442 Personal history of urinary calculi: Secondary | ICD-10-CM | POA: Diagnosis not present

## 2022-05-26 DIAGNOSIS — K8063 Calculus of gallbladder and bile duct with acute cholecystitis with obstruction: Secondary | ICD-10-CM | POA: Diagnosis present

## 2022-05-26 DIAGNOSIS — K8050 Calculus of bile duct without cholangitis or cholecystitis without obstruction: Secondary | ICD-10-CM | POA: Diagnosis not present

## 2022-05-26 DIAGNOSIS — K810 Acute cholecystitis: Secondary | ICD-10-CM | POA: Diagnosis present

## 2022-05-26 DIAGNOSIS — E46 Unspecified protein-calorie malnutrition: Secondary | ICD-10-CM | POA: Diagnosis present

## 2022-05-26 DIAGNOSIS — I10 Essential (primary) hypertension: Secondary | ICD-10-CM | POA: Diagnosis present

## 2022-05-26 DIAGNOSIS — Z85820 Personal history of malignant melanoma of skin: Secondary | ICD-10-CM | POA: Diagnosis not present

## 2022-05-26 DIAGNOSIS — Z8546 Personal history of malignant neoplasm of prostate: Secondary | ICD-10-CM | POA: Diagnosis not present

## 2022-05-26 DIAGNOSIS — K8000 Calculus of gallbladder with acute cholecystitis without obstruction: Principal | ICD-10-CM

## 2022-05-26 DIAGNOSIS — Z87891 Personal history of nicotine dependence: Secondary | ICD-10-CM | POA: Diagnosis not present

## 2022-05-26 DIAGNOSIS — C786 Secondary malignant neoplasm of retroperitoneum and peritoneum: Secondary | ICD-10-CM | POA: Diagnosis present

## 2022-05-26 DIAGNOSIS — K828 Other specified diseases of gallbladder: Secondary | ICD-10-CM | POA: Diagnosis present

## 2022-05-26 DIAGNOSIS — R7401 Elevation of levels of liver transaminase levels: Secondary | ICD-10-CM | POA: Diagnosis present

## 2022-05-26 DIAGNOSIS — C9001 Multiple myeloma in remission: Secondary | ICD-10-CM | POA: Diagnosis present

## 2022-05-26 DIAGNOSIS — Z79891 Long term (current) use of opiate analgesic: Secondary | ICD-10-CM | POA: Diagnosis not present

## 2022-05-26 DIAGNOSIS — D638 Anemia in other chronic diseases classified elsewhere: Secondary | ICD-10-CM | POA: Diagnosis present

## 2022-05-26 DIAGNOSIS — K219 Gastro-esophageal reflux disease without esophagitis: Secondary | ICD-10-CM | POA: Diagnosis present

## 2022-05-26 DIAGNOSIS — K660 Peritoneal adhesions (postprocedural) (postinfection): Secondary | ICD-10-CM | POA: Diagnosis present

## 2022-05-26 DIAGNOSIS — Z859 Personal history of malignant neoplasm, unspecified: Secondary | ICD-10-CM

## 2022-05-26 DIAGNOSIS — Z823 Family history of stroke: Secondary | ICD-10-CM | POA: Diagnosis not present

## 2022-05-26 DIAGNOSIS — E785 Hyperlipidemia, unspecified: Secondary | ICD-10-CM | POA: Diagnosis present

## 2022-05-26 DIAGNOSIS — I2510 Atherosclerotic heart disease of native coronary artery without angina pectoris: Secondary | ICD-10-CM | POA: Diagnosis present

## 2022-05-26 DIAGNOSIS — C3490 Malignant neoplasm of unspecified part of unspecified bronchus or lung: Secondary | ICD-10-CM | POA: Diagnosis present

## 2022-05-26 DIAGNOSIS — Z8051 Family history of malignant neoplasm of kidney: Secondary | ICD-10-CM | POA: Diagnosis not present

## 2022-05-26 HISTORY — PX: ERCP: SHX5425

## 2022-05-26 LAB — COMPREHENSIVE METABOLIC PANEL
ALT: 64 U/L — ABNORMAL HIGH (ref 0–44)
AST: 93 U/L — ABNORMAL HIGH (ref 15–41)
Albumin: 2.9 g/dL — ABNORMAL LOW (ref 3.5–5.0)
Alkaline Phosphatase: 106 U/L (ref 38–126)
Anion gap: 7 (ref 5–15)
BUN: 17 mg/dL (ref 8–23)
CO2: 24 mmol/L (ref 22–32)
Calcium: 8.6 mg/dL — ABNORMAL LOW (ref 8.9–10.3)
Chloride: 105 mmol/L (ref 98–111)
Creatinine, Ser: 0.92 mg/dL (ref 0.61–1.24)
GFR, Estimated: >60 mL/min (ref >60)
Glucose, Bld: 98 mg/dL (ref 70–99)
Potassium: 3.3 mmol/L — ABNORMAL LOW (ref 3.5–5.1)
Sodium: 136 mmol/L (ref 135–145)
Total Bilirubin: 0.7 mg/dL (ref 0.3–1.2)
Total Protein: 7.4 g/dL (ref 6.5–8.1)

## 2022-05-26 LAB — CBC WITH DIFFERENTIAL/PLATELET
Abs Immature Granulocytes: 0.09 10*3/uL — ABNORMAL HIGH (ref 0.00–0.07)
Basophils Absolute: 0 10*3/uL (ref 0.0–0.1)
Basophils Relative: 1 %
Eosinophils Absolute: 0.1 10*3/uL (ref 0.0–0.5)
Eosinophils Relative: 2 %
HCT: 32.6 % — ABNORMAL LOW (ref 39.0–52.0)
Hemoglobin: 11.1 g/dL — ABNORMAL LOW (ref 13.0–17.0)
Immature Granulocytes: 2 %
Lymphocytes Relative: 20 %
Lymphs Abs: 1.2 10*3/uL (ref 0.7–4.0)
MCH: 34 pg (ref 26.0–34.0)
MCHC: 34 g/dL (ref 30.0–36.0)
MCV: 100 fL (ref 80.0–100.0)
Monocytes Absolute: 1.5 10*3/uL — ABNORMAL HIGH (ref 0.1–1.0)
Monocytes Relative: 24 %
Neutro Abs: 3.2 10*3/uL (ref 1.7–7.7)
Neutrophils Relative %: 51 %
Platelets: 183 10*3/uL (ref 150–400)
RBC: 3.26 MIL/uL — ABNORMAL LOW (ref 4.22–5.81)
RDW: 11.9 % (ref 11.5–15.5)
WBC: 6.1 10*3/uL (ref 4.0–10.5)
nRBC: 0 % (ref 0.0–0.2)

## 2022-05-26 LAB — LIPASE, BLOOD: Lipase: 28 U/L (ref 11–51)

## 2022-05-26 SURGERY — ERCP, WITH INTERVENTION IF INDICATED
Anesthesia: General

## 2022-05-26 MED ORDER — FENTANYL CITRATE (PF) 100 MCG/2ML IJ SOLN
INTRAMUSCULAR | Status: AC
  Filled 2022-05-26: qty 2

## 2022-05-26 MED ORDER — PANTOPRAZOLE SODIUM 40 MG PO TBEC
40.0000 mg | DELAYED_RELEASE_TABLET | Freq: Every day | ORAL | Status: DC
  Administered 2022-05-26 – 2022-05-29 (×3): 40 mg via ORAL
  Filled 2022-05-26 (×4): qty 1

## 2022-05-26 MED ORDER — ACETAMINOPHEN 325 MG PO TABS: 650.0000 mg | ORAL_TABLET | Freq: Four times a day (QID) | ORAL | Status: DC | PRN

## 2022-05-26 MED ORDER — FENTANYL CITRATE (PF) 100 MCG/2ML IJ SOLN
INTRAMUSCULAR | Status: DC | PRN
  Administered 2022-05-26 (×2): 25 ug via INTRAVENOUS

## 2022-05-26 MED ORDER — OXYCODONE HCL 5 MG PO TABS
10.0000 mg | ORAL_TABLET | Freq: Three times a day (TID) | ORAL | Status: DC | PRN
  Administered 2022-05-29: 10 mg via ORAL
  Filled 2022-05-26: qty 2

## 2022-05-26 MED ORDER — POTASSIUM CHLORIDE 10 MEQ/100ML IV SOLN: 10.0000 meq | INTRAVENOUS | Status: AC

## 2022-05-26 MED ORDER — DICLOFENAC SUPPOSITORY 100 MG
100.0000 mg | Freq: Once | RECTAL | Status: AC
  Administered 2022-05-26: 100 mg via RECTAL

## 2022-05-26 MED ORDER — FOLIC ACID 1 MG PO TABS
1.0000 mg | ORAL_TABLET | Freq: Every day | ORAL | Status: DC
  Administered 2022-05-26 – 2022-05-29 (×3): 1 mg via ORAL
  Filled 2022-05-26 (×4): qty 1

## 2022-05-26 MED ORDER — PIPERACILLIN-TAZOBACTAM 3.375 G IVPB: 3.3750 g | Freq: Three times a day (TID) | INTRAVENOUS | Status: DC

## 2022-05-26 MED ORDER — LACTATED RINGERS IV BOLUS
1000.0000 mL | Freq: Once | INTRAVENOUS | Status: AC
  Administered 2022-05-26: 1000 mL via INTRAVENOUS

## 2022-05-26 MED ORDER — ONDANSETRON HCL 4 MG/2ML IJ SOLN: 4.0000 mg | Freq: Four times a day (QID) | INTRAMUSCULAR | Status: DC | PRN

## 2022-05-26 MED ORDER — PROPOFOL 10 MG/ML IV BOLUS
INTRAVENOUS | Status: DC | PRN
  Administered 2022-05-26: 50 mg via INTRAVENOUS
  Administered 2022-05-26: 20 mg via INTRAVENOUS

## 2022-05-26 MED ORDER — PIPERACILLIN-TAZOBACTAM 3.375 G IVPB
3.3750 g | Freq: Three times a day (TID) | INTRAVENOUS | Status: DC
  Administered 2022-05-26 – 2022-05-29 (×8): 3.375 g via INTRAVENOUS
  Filled 2022-05-26 (×8): qty 50

## 2022-05-26 MED ORDER — ONDANSETRON HCL 4 MG PO TABS: 4.0000 mg | ORAL_TABLET | Freq: Four times a day (QID) | ORAL | Status: DC | PRN

## 2022-05-26 MED ORDER — MORPHINE SULFATE (PF) 2 MG/ML IV SOLN
2.0000 mg | INTRAVENOUS | Status: DC | PRN
  Administered 2022-05-28 – 2022-05-29 (×4): 2 mg via INTRAVENOUS
  Filled 2022-05-26 (×4): qty 1

## 2022-05-26 MED ORDER — PROPOFOL 500 MG/50ML IV EMUL
INTRAVENOUS | Status: DC | PRN
  Administered 2022-05-26: 150 ug/kg/min via INTRAVENOUS

## 2022-05-26 MED ORDER — ACETAMINOPHEN 650 MG RE SUPP: 650.0000 mg | Freq: Four times a day (QID) | RECTAL | Status: DC | PRN

## 2022-05-26 MED ORDER — DICLOFENAC SUPPOSITORY 100 MG
RECTAL | Status: AC
  Filled 2022-05-26: qty 1

## 2022-05-26 MED ORDER — LACTATED RINGERS IV SOLN: INTRAVENOUS | Status: DC

## 2022-05-26 MED ORDER — LIDOCAINE HCL (CARDIAC) PF 100 MG/5ML IV SOSY
PREFILLED_SYRINGE | INTRAVENOUS | Status: DC | PRN
  Administered 2022-05-26: 100 mg via INTRAVENOUS

## 2022-05-26 NOTE — Consult Note (Signed)
SURGICAL CONSULTATION NOTE   HISTORY OF PRESENT ILLNESS (HPI):  76 y.o. male presented to [facility] ED for evaluation of abdominal pain. Patient reports he has been having intermittent abdominal pain for the last 4 to 5 days.  Pain in the upper abdomen.  Pain radiates to his back.  Denies any fever.  Patient cannot identify any alleviating or aggravating factors.  He had a CT scan done by his oncologist.  This showed choledocholithiasis with inflammation of the gallbladder.  I personally evaluated the images.  Mild elevation of liver enzymes.  No significant elevation of bilirubin.  Lipase within normal limits.  Patient had ERCP today with resolution of the choledocholithiasis.  Surgery is consulted by Dr. Amin in this context for evaluation and management of cholecystitis with choledocholithiasis.  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   DDD (degenerative disc disease)    Fractures    compression T12,L3   GERD (gastroesophageal reflux disease)    History of chemotherapy    weekly Velcade,Cytoxan   HTN (hypertension)    Hypercalcemia of malignancy    Hyperlipemia    Multiple myeloma 07/09/2011   Multiple myeloma    Myeloma kidney (HCC)    Peripheral neuropathy    toes, sees Dr Yan   Prostate cancer (HCC) 2008   s/p prostatectomy   Prostate cancer (HCC) 03/2002   Renal insufficiency      PAST SURGICAL HISTORY (PSH):  Past Surgical History:  Procedure Laterality Date   BASAL CELL CARCINOMA EXCISION Left 10/22/2020   [facility] (Dr. Mario Mitov)   CATARACT EXTRACTION W/ INTRAOCULAR LENS IMPLANT Bilateral    DEXA  05/2011   spine 0.7, L femur 0.0, R femur -0.6, normal   KIDNEY STONE SURGERY     KIDNEY STONE SURGERY  06/2009   Stone  removed from bladder   MELANOMA EXCISION Left 06/05/2019   Procedure: REEXCISION MELANOMA OF LEFT POSTERIOR AURICLE;  Surgeon: Martin, Matthew, MD;  Location: [facility];  Service: General;  Laterality: Left;   PORTACATH PLACEMENT  08/05/11   right sided portacath   PROSTATECTOMY  04/06/07   TOE AMPUTATION Left 2017   2nd toe left foot     MEDICATIONS:  Prior to Admission medications   Medication Sig Start Date End Date Taking? Authorizing Provider  folic acid (FOLVITE) 1 MG tablet Take 1 tablet (1 mg total) by mouth daily. 01/19/22  Yes Mohamed, Mohamed, MD  lidocaine-prilocaine (EMLA) cream Apply topically as needed. Apply to port site one hour before treatment and cover with plastic wrap 10/13/19  Yes Gorsuch, Ni, MD  aspirin 81 MG tablet Take 81 mg by mouth daily. Patient not taking: Reported on 05/26/2022    [provider]  B Complex Vitamins (VITAMIN-B COMPLEX) TABS Take 1 tablet by mouth daily.    [provider]  Calcium Carbonate-Vitamin D 600-400 MG-UNIT per tablet Take 1 tablet by mouth daily. Patient not taking: Reported on 03/17/2022    [provider]  cholecalciferol (VITAMIN D) 1000 UNITS tablet Take 1,000 Units by mouth daily. Patient not taking: Reported on 03/17/2022    [provider]  ciprofloxacin (CIPRO) 500 MG tablet Take 1 tablet (500 mg total) by mouth 2 (two) times daily.   Patient not taking: Reported on 05/26/2022 05/22/22   Borders, Kirt Boys, NP  Cyanocobalamin (VITAMIN B 12 PO) Take 1,000 mg by mouth daily.    [provider]  fentaNYL (DURAGESIC) 75 MCG/HR Place 1 patch onto the skin every other day. 12/15/21   [provider]  mirtazapine (REMERON) 15 MG tablet Take 15 mg by mouth at bedtime. Patient not taking: Reported on 03/17/2022 02/16/22   [provider]  NONFORMULARY OR COMPOUNDED ITEM Baclofn/amitrp/ketamn cream topically to feet TID prn for neuropathy    [provider]   ondansetron (ZOFRAN) 4 MG tablet Take 4 mg by mouth every 8 (eight) hours as needed. 02/16/22   [provider]  Oxycodone HCl 10 MG TABS Take 1-2 tablets by mouth 3 (three) times daily as needed. 02/10/22   [provider]  pantoprazole (PROTONIX) 40 MG tablet Take 1 tablet (40 mg total) by mouth daily. 03/17/22   Cammie Sickle, MD  potassium chloride SA (KLOR-CON M) 20 MEQ tablet 1 pill twice a day 05/06/22   Cammie Sickle, MD  prochlorperazine (COMPAZINE) 10 MG tablet Take 1 tablet (10 mg total) by mouth every 6 (six) hours as needed for nausea or vomiting. 01/19/22   Curt Bears, MD     ALLERGIES:  Allergies  Allergen Reactions   Celebrex [Celecoxib] Nausea Only   Cymbalta [Duloxetine Hcl] Other (See Comments) and Hives    Weight loss Weight loss   Hydromorphone Hcl Itching   Meloxicam Itching   Oxycontin [Oxycodone] Itching    Also caused constipation, insomnia Also caused constipation, insomnia Also caused constipation, insomnia   Percocet [Oxycodone-Acetaminophen] Itching   Vicodin [Hydrocodone-Acetaminophen] Itching   Lyrica [Pregabalin] Rash    Lesions     SOCIAL HISTORY:  Social History   Socioeconomic History   Marital status: Single    Spouse name: Not on file   Number of children: Not on file   Years of education: Not on file   Highest education level: Not on file  Occupational History   Occupation: Sales    Comment: [employer]   Tobacco Use   Smoking status: Former    Packs/day: 2.00    Years: 30.00    Total pack years: 60.00    Types: Cigarettes    Quit date: 07/27/1996    Years since quitting: 25.8   Smokeless tobacco: Never  Vaping Use   Vaping Use: Never used  Substance and Sexual Activity   Alcohol use: No   Drug use: No   Sexual activity: Never  Other Topics Concern   Not on file  Social History Narrative   [place] to ; condos; [wife with stroke- better]. Quit smoking  1998; no alcohol.    Social Determinants of Health   Financial Resource Strain: Not on file  Food Insecurity: Not on file  Transportation Needs: Not on file  Physical Activity: Not on file  Stress: Not on file  Social Connections: Not on file  Intimate Partner Violence: Not on file      FAMILY HISTORY:  Family History  Problem Relation Age of Onset   Pneumonia Mother    Stroke Father    Cancer Brother        lung   Kidney cancer Sister    Colon cancer Neg Hx      REVIEW OF SYSTEMS:  Constitutional: denies weight loss, fever, chills, or sweats  Eyes: denies any other vision changes, history of eye injury  ENT:  denies sore throat, hearing problems  Respiratory: denies shortness of breath, wheezing  Cardiovascular: denies chest pain, palpitations  Gastrointestinal: Positive abdominal pain, nausea and vomiting Genitourinary: denies burning with urination or urinary frequency Musculoskeletal: denies any other joint pains or cramps  Skin: denies any other rashes or skin discolorations  Neurological: denies any other headache, dizziness, weakness  Psychiatric: denies any other depression, anxiety   All other review of systems were negative   VITAL SIGNS:  Temp:  [96 F (35.6 C)-98.2 F (36.8 C)] 96 F (35.6 C) (10/31 1633) Pulse Rate:  [59-85] 64 (10/31 1554) Resp:  [16-21] 18 (10/31 1554) BP: (85-156)/(59-91) 126/78 (10/31 1633) SpO2:  [97 %-100 %] 100 % (10/31 1554) Weight:  [71.2 kg] 71.2 kg (10/31 1554)     Height: 6' 1" (185.4 cm) Weight: 71.2 kg BMI (Calculated): 20.72   INTAKE/OUTPUT:  This shift: Total I/O In: 300 [I.V.:300] Out: -   Last 2 shifts: @IOLAST2SHIFTS@   PHYSICAL EXAM:  Constitutional:  -- Normal body habitus  -- Awake, alert, and oriented x3  Eyes:  -- Pupils equally round and reactive to light  -- No scleral icterus  Ear, nose, and throat:  -- No jugular venous distension  Pulmonary:  -- No crackles  -- Equal breath sounds  bilaterally -- Breathing non-labored at rest Cardiovascular:  -- S1, S2 present  -- No pericardial rubs Gastrointestinal:  -- Abdomen soft, nontender, non-distended, no guarding or rebound tenderness -- No abdominal masses appreciated, pulsatile or otherwise  Musculoskeletal and Integumentary:  -- Wounds: None appreciated -- Extremities: B/L UE and LE FROM, hands and feet warm, no edema  Neurologic:  -- Motor function: intact and symmetric -- Sensation: intact and symmetric   Labs:     Latest Ref Rng & Units 05/26/2022    1:03 AM 05/25/2022    2:42 PM 05/22/2022    1:10 PM  CBC  WBC 4.0 - 10.5 K/uL 6.1  5.5  13.4   Hemoglobin 13.0 - 17.0 g/dL 11.1  12.3  11.6   Hematocrit 39.0 - 52.0 % 32.6  35.8  34.0   Platelets 150 - 400 K/uL 183  183  169       Latest Ref Rng & Units 05/26/2022    1:03 AM 05/25/2022    2:42 PM 05/22/2022    1:10 PM  CMP  Glucose 70 - 99 mg/dL 98  126  113   BUN 8 - 23 mg/dL 17  20  28   Creatinine 0.61 - 1.24 mg/dL 0.92  1.03  1.31   Sodium 135 - 145 mmol/L 136  137  132   Potassium 3.5 - 5.1 mmol/L 3.3  3.7  3.8   Chloride 98 - 111 mmol/L 105  106  99   CO2 22 - 32 mmol/L 24  23  24   Calcium 8.9 - 10.3 mg/dL 8.6  9.1  8.9   Total Protein 6.5 - 8.1 g/dL 7.4  7.5  7.5   Total Bilirubin 0.3 - 1.2 mg/dL 0.7  0.6  0.9   Alkaline Phos 38 - 126 U/L 106  118  86   AST 15 - 41 U/L 93  109  68   ALT 0 - 44 U/L 64  66  29     Imaging studies:  EXAM: CT ABDOMEN AND PELVIS WITHOUT CONTRAST   TECHNIQUE: Multidetector CT imaging of the abdomen and pelvis was performed following the standard protocol without IV contrast.   RADIATION DOSE   REDUCTION: This exam was performed according to the departmental dose-optimization program which includes automated exposure control, adjustment of the mA and/or kV according to patient size and/or use of iterative reconstruction technique.   COMPARISON:  03/18/2022   FINDINGS: Lower chest: Loculated effusion  seen in the posterior left lower lung field has not changed. There are linear densities in lingula with no significant change. There is increase in interstitial markings in the right lower lung fields suggesting scarring from chronic interstitial lung disease. Left hemidiaphragm is elevated. Small pericardial effusion is present. Coronary artery calcifications are seen.   Hepatobiliary: There is no dilation of bile ducts. Gallbladder is distended. There is wall thickening in gallbladder. There is minimal amount of fluid around the gallbladder. Gallbladder stones are seen. There is 4 mm calcific density in the region of neck of the gallbladder. There are calcific densities in the distal course of common bile duct close to the ampulla.   Pancreas: Pancreas appears smaller than usual in size. No focal abnormalities are seen.   Spleen: Unremarkable.   Adrenals/Urinary Tract: Adrenals are unremarkable.There is no hydronephrosis. Bilateral renal stones are seen. Largest of the stones is in the upper pole of left kidney measuring 11 mm. There is 2.7 cm cyst in the anterior midportion of left kidney. Ureters are not dilated. Urinary bladder is not distended.   Stomach/Bowel: Stomach is unremarkable. Small bowel loops are not dilated. Appendix is not dilated. There is no significant wall thickening in colon. Few diverticula are seen in colon.   Vascular/Lymphatic: There are scattered calcifications in aorta and its major branches.   Reproductive: Prostate is not seen suggesting previous removal.   Other: There is no ascites or pneumoperitoneum. Small umbilical hernia containing fat is seen. Small bilateral inguinal hernias containing fat are noted.   Musculoskeletal: Compression fractures and vertebroplasty are noted in multiple lower thoracic and lumbar vertebral bodies. There is deformity in the right pubic bone which has not changed.   IMPRESSION: Gallbladder is distended.  Stones are noted in the lumen of gallbladder, neck of gallbladder and distal common bile duct. There is pericholecystic stranding. Findings suggest acute cholecystitis. Follow-up gallbladder sonogram and surgical consultation should be considered.   There is no evidence of intestinal obstruction or pneumoperitoneum. There is no hydronephrosis.   Bilateral renal stones.  Left renal cyst.   Loculated effusion is seen in the posterior left lower lung fields. Coronary artery calcifications are seen. Scarring is seen in the lower lung fields.   Other findings as described in the body of the report.   These results will be called to the ordering clinician or representative by the Radiologist Assistant, and communication documented in the PACS or [facility] Dashboard.     Electronically Signed   By: Palani  Rathinasamy M.D.   On: 05/25/2022 09:19  Assessment/Plan:  76 y.o. male with cholecystitis with choledocholithiasis, complicated by pertinent comorbidities including lung cancer.   Patient with cholelithiasis with cholecystitis.  He underwent ERCP with resolution of choledocholithiasis.  I discussed with patient recommendation of cholecystectomy.  We will observe patient for the next 24 hours to see if he develops post ERCP pancreatitis.  If not we will coordinate cholecystectomy for Thursday.   Cintrn-Daz, MD   

## 2022-05-26 NOTE — Op Note (Signed)
[facility] Patient Name: [name] Procedure Date: 05/26/2022 12:22 PM MRN: [id number] Account #: [id number] Date of Birth: 04-Jul-1946 Admit Type: Outpatient Age: 76 Room: [facility] ENDO ROOM 4 Gender: Male Note Status: Finalized Instrument Name: [id number] Procedure:             ERCP Indications:           Common bile duct stone(s) Providers:             Lucilla Lame MD, MD Medicines:             Propofol per Anesthesia Complications:         No immediate complications. Procedure:             Pre-Anesthesia Assessment:                        - Prior to the procedure, a History and Physical was                         performed, and patient medications and allergies were                         reviewed. The patient's tolerance of previous                         anesthesia was also reviewed. The risks and benefits                         of the procedure and the sedation options and risks                         were discussed with the patient. All questions were                         answered, and informed consent was obtained. Prior                         Anticoagulants: The patient has taken no anticoagulant                         or antiplatelet agents. ASA Grade Assessment: III - A                         patient with severe systemic disease. After reviewing                         the risks and benefits, the patient was deemed in                         satisfactory condition to undergo the procedure.                        After obtaining informed consent, the scope was passed                         under direct vision. Throughout the procedure, the                         patient's blood pressure,  pulse, and oxygen                         saturations were monitored continuously. The                         Duodenoscope was introduced through the mouth, and                         used to inject contrast into and used to inject                          contrast into the bile duct. The ERCP was accomplished                         without difficulty. The patient tolerated the                         procedure well. Findings:      The scout film was normal. The esophagus was successfully intubated       under direct vision. The scope was advanced to a normal major papilla in       the descending duodenum without detailed examination of the pharynx,       larynx and associated structures, and upper GI tract. The upper GI tract       was grossly normal. The bile duct was deeply cannulated with the       short-nosed traction sphincterotome. Contrast was injected. I personally       interpreted the bile duct images. There was brisk flow of contrast       through the ducts. Image quality was excellent. Contrast extended to the       entire biliary tree. The lower third of the main bile duct contained one       stone. A wire was passed into the biliary tree. An 8 mm biliary       sphincterotomy was made with a traction (standard) sphincterotome using       ERBE electrocautery. There was no post-sphincterotomy bleeding. The       biliary tree was swept with a 15 mm balloon starting at the bifurcation.       One stone was removed. No stones remained. Impression:            - Choledocholithiasis was found. Complete removal was                         accomplished by biliary sphincterotomy and balloon                         extraction.                        - A biliary sphincterotomy was performed.                        - The biliary tree was swept. Recommendation:        - Return patient to hospital ward for ongoing care.                        - Clear liquid diet.                        -  Watch for pancreatitis, bleeding, perforation, and                         cholangitis. Procedure Code(s):     --- Professional ---                        [phone], Endoscopic retrograde cholangiopancreatography                          (ERCP); with removal of calculi/debris from                         biliary/pancreatic duct(s)                        43262, Endoscopic retrograde cholangiopancreatography                         (ERCP); with sphincterotomy/papillotomy                        [phone], Endoscopic catheterization of the biliary                         ductal system, radiological supervision and                         interpretation Diagnosis Code(s):     --- Professional ---                        K80.50, Calculus of bile duct without cholangitis or                         cholecystitis without obstruction CPT copyright 2022 American Medical Association. All rights reserved. The codes documented in this report are preliminary and upon coder review may  be revised to meet current compliance requirements. Lucilla Lame MD, MD 05/26/2022 4:30:48 PM This report has been signed electronically. Number of Addenda: 0 Note Initiated On: 05/26/2022 12:22 PM Estimated Blood Loss:  Estimated blood loss: none.      [facility]

## 2022-05-26 NOTE — ED Notes (Signed)
Pt received from the ED. He is oriented x4; up with standby assist. VSS. Denies pain.   05/26/22 1848  Vitals  Temp 98.6 F (37 C)  Temp Source Oral  BP (!) 146/82  MAP (mmHg) 102  BP Location Right Arm  BP Method Automatic  Patient Position (if appropriate) Lying  Pulse Rate 61  Pulse Rate Source Monitor  Resp 16  MEWS COLOR  MEWS Score Color Green  Oxygen Therapy  SpO2 100 %  Pain Assessment  Pain Scale 0-10  Pain Score 0

## 2022-05-26 NOTE — Assessment & Plan Note (Addendum)
No acute issues.

## 2022-05-26 NOTE — Hospital Course (Addendum)
Taken from H&P.  HPI: [name] is a 76 y.o. male with medical history significant for HTN, prostate cancer s/p prostatectomy 2003, multiple myeloma in remission and more recently stage IV non-small cell lung cancer on palliative chemotherapy and immunotherapy, presents to the ED with a complaint of abdominal pain.  Patient recently saw his PCP for the same and outpatient CT scan was concerning for cholecystitis.  He spoke with surgeon, Dr. Hampton Abbot who recommended reevaluation in the ED.  Patient endorses nausea but no vomiting.  He denies fever or chills.   ED course and data review:Vitals initially within normal limits though BP did dip as low as 85/59, fluid responsive to 152/86.  Blood work with normal WBC.  Mild transaminitis with AST 93 and ALT 64.  Lipase 28. Right upper quadrant ultrasound showing the following: IMPRESSION: 1. Gallstones with gallbladder distension and edematous gallbladder wall thickening. Findings are suspicious for acute cholecystitis, however there is no sonographic Murphy sign. 2. No biliary dilatation.   Outpatient CT abdomen and pelvis done earlier in the day showed the following IMPRESSION: Gallbladder is distended. Stones are noted in the lumen of gallbladder, neck of gallbladder and distal common bile duct. There is pericholecystic stranding. Findings suggest acute cholecystitis. Follow-up gallbladder sonogram and surgical consultation should be considered.  General surgery and GI was consulted . Patient was started on Zosyn.  10/31: Patient was taken for ERCP by GI today.  Found to have choledocholithiasis, 1 stone was removed.  Tolerated the procedure well.  11/1: S/p ERCP with removal of 1 stone from bile duct.  General surgery is planning cholecystectomy tomorrow.  Patient will remain on clear liquid diet and Zosyn today.  11/2: No acute concerns. Going for cholecystectomy later today.  11/3: Patient underwent successful laparoscopic  cholecystectomy with general surgery yesterday.  Tolerated the procedure well.  Labs seems stable.  Patient was able to start on diet and tolerating it well.  Being evaluated by PT and they were recommending home health which was ordered.  Patient is being discharged on his current medications and need to have a close follow-up with general surgery and his providers.

## 2022-05-26 NOTE — H&P (Addendum)
History and Physical    Patient: [name] VXB:[id number] DOB: 06/22/1946 DOA: 05/26/2022 DOS: the patient was seen and examined on 05/26/2022 PCP: Jinny Sanders, MD  Patient coming from: Home  Chief Complaint:  Chief Complaint  Patient presents with   Abdominal Pain    HPI: [name] is a 76 y.o. male with medical history significant for HTN, prostate cancer s/p prostatectomy 2003, multiple myeloma in remission and more recently stage IV non-small cell lung cancer on palliative chemotherapy and immunotherapy, presents to the ED with a complaint of abdominal pain.  Patient recently saw his PCP for the same and outpatient CT scan was concerning for cholecystitis.  He spoke with surgeon, Dr. Hampton Abbot who recommended reevaluation in the ED.  Patient endorses nausea but no vomiting.  He denies fever or chills.   ED course and data review:Vitals initially within normal limits though BP did dip as low as 85/59, fluid responsive to 152/86.  Blood work with normal WBC.  Mild transaminitis with AST 93 and ALT 64.  Lipase 28. Right upper quadrant ultrasound showing the following: IMPRESSION: 1. Gallstones with gallbladder distension and edematous gallbladder wall thickening. Findings are suspicious for acute cholecystitis, however there is no sonographic Murphy sign. 2. No biliary dilatation.  Outpatient CT abdomen and pelvis done earlier in the day showed the following IMPRESSION: Gallbladder is distended. Stones are noted in the lumen of gallbladder, neck of gallbladder and distal common bile duct. There is pericholecystic stranding. Findings suggest acute cholecystitis. Follow-up gallbladder sonogram and surgical consultation should be considered.  Patient treated with an LR bolus.  ED provider Dr. Karma Greaser spoke with GI, Dr. Haig Prophet who advised that ERCP services would be available on 10/31.  Hospitalist consulted for admission.   Review of Systems: As mentioned in the history  of present illness. All other systems reviewed and are negative.  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 Leonie Man)   CATARACT EXTRACTION W/ INTRAOCULAR LENS IMPLANT Bilateral    DEXA  05/2011   spine 0.7, L femur 0.0, R femur -0.6, normal   KIDNEY STONE SURGERY     KIDNEY STONE SURGERY  06/2009   Stone removed from bladder   MELANOMA EXCISION Left 06/05/2019   Procedure: REEXCISION MELANOMA OF LEFT POSTERIOR AURICLE;  Surgeon: Johnathan Hausen, MD;  Location: [facility];  Service: General;  Laterality: Left;   PORTACATH PLACEMENT  08/05/11   right sided portacath   PROSTATECTOMY  04/06/07   TOE AMPUTATION Left 2017   2nd toe left foot   Social History:  reports  that he quit smoking about 25 years ago. His smoking use included cigarettes. He has a 60.00 pack-year smoking history. He has never used smokeless tobacco. He reports that he does not drink alcohol and does not use drugs.  Allergies  Allergen Reactions   Celebrex [Celecoxib] Nausea Only   Cymbalta [Duloxetine Hcl] Other (See Comments) and Hives    Weight loss Weight loss   Hydromorphone Hcl Itching   Meloxicam Itching    Oxycontin [Oxycodone] Itching    Also caused constipation, insomnia Also caused constipation, insomnia Also caused constipation, insomnia   Percocet [Oxycodone-Acetaminophen] Itching   Vicodin [Hydrocodone-Acetaminophen] Itching   Lyrica [Pregabalin] Rash    Lesions    Family History  Problem Relation Age of Onset   Pneumonia Mother    Stroke Father    Cancer Brother        lung   Kidney cancer Sister    Colon cancer Neg Hx     Prior to Admission medications   Medication Sig Start Date End Date Taking? Authorizing Provider  aspirin 81 MG tablet Take 81 mg by mouth daily.    [provider]  B Complex Vitamins (VITAMIN-B COMPLEX) TABS Take 1 tablet by mouth daily.    [provider]  Calcium Carbonate-Vitamin D 600-400 MG-UNIT per tablet Take 1 tablet by mouth daily. Patient not taking: Reported on 03/17/2022    [provider]  cholecalciferol (VITAMIN D) 1000 UNITS tablet Take 1,000 Units by mouth daily. Patient not taking: Reported on 03/17/2022    [provider]  ciprofloxacin (CIPRO) 500 MG tablet Take 1 tablet (500 mg total) by mouth 2 (two) times daily. 05/22/22   Borders, Kirt Boys, NP  Cyanocobalamin (VITAMIN B 12 PO) Take 1,000 mg by mouth daily.    [provider]  fentaNYL (DURAGESIC) 75 MCG/HR Place 1 patch onto the skin every other day. 12/15/21   [provider]  folic acid (FOLVITE) 1 MG tablet Take 1 tablet (1 mg total) by mouth daily. Patient not taking: Reported on 03/17/2022 01/19/22   Curt Bears, MD  lidocaine-prilocaine (EMLA) cream Apply topically as needed. Apply to port site one hour before treatment and cover with plastic wrap 10/13/19   Heath Lark, MD  mirtazapine (REMERON) 15 MG tablet Take 15 mg by mouth at bedtime. Patient not taking: Reported on 03/17/2022 02/16/22   [provider]  NONFORMULARY OR COMPOUNDED ITEM Baclofn/amitrp/ketamn cream topically to feet TID prn for  neuropathy Patient not taking: Reported on 03/17/2022    [provider]  ondansetron (ZOFRAN) 4 MG tablet Take 4 mg by mouth every 8 (eight) hours as needed. Patient not taking: Reported on 03/17/2022 02/16/22   [provider]  Oxycodone HCl 10 MG TABS Take 1-2 tablets by mouth 3 (three) times daily as needed. Patient not taking: Reported on 04/15/2022 02/10/22   [provider]  pantoprazole (PROTONIX) 40 MG tablet Take 1 tablet (40 mg total) by mouth daily. 03/17/22   Cammie Sickle, MD  potassium chloride SA (KLOR-CON M) 20 MEQ tablet 1 pill twice a day 05/06/22   Cammie Sickle, MD  prochlorperazine (COMPAZINE) 10 MG tablet Take 1 tablet (10 mg total) by mouth every 6 (six) hours as needed for nausea or vomiting. Patient not taking: Reported on 03/17/2022 01/19/22   Curt Bears, MD    Physical Exam: Vitals:   05/25/22 1815 05/26/22 0015 05/26/22 0057 05/26/22 0203  BP: (!) 153/91 (!) 85/59 138/81 (!) 152/86  Pulse: 85 76 73 70  Resp: _0 Temp: 97.7 F (36.5 C) 97.8 F (36.6 C) (!) 97.5 F (36.4 C)   TempSrc: Axillary Oral Oral   SpO2: 97% 100% 97% 98%  Weight:       Physical Exam Physical Exam Vitals and nursing note reviewed.  Constitutional:      General: He is awake. He is not in acute distress.    Appearance: He is cachectic.  HENT:     Head: Normocephalic and atraumatic.  Cardiovascular:     Rate and Rhythm: Normal rate and regular rhythm.     Heart sounds: Normal heart sounds.  Pulmonary:     Effort: Pulmonary effort is normal.     Breath sounds: Normal breath sounds.  Abdominal:     Palpations: Abdomen is soft.     Tenderness: There is abdominal tenderness in the right upper quadrant.  Neurological:     Mental Status: Mental status is at baseline.  Labs on Admission: I have personally reviewed following labs and imaging studies  CBC: Recent Labs  Lab 05/20/22 1400 05/22/22 1310 05/25/22 1442 05/26/22 0103   WBC 23.1* 13.4* 5.5 6.1  NEUTROABS 18.4* 10.0*  --  3.2  HGB 13.7 11.6* 12.3* 11.1*  HCT 40.6 34.0* 35.8* 32.6*  MCV 105.2* 104.9* 101.1* 100.0  PLT 225 169 183 536   Basic Metabolic Panel: Recent Labs  Lab 05/20/22 1400 05/22/22 1310 05/25/22 1442 05/26/22 0103  NA 133* 132* 137 136  K 4.4 3.8 3.7 3.3*  CL 100 99 106 105  CO2 _1 GLUCOSE 128* 113* 126* 98  BUN 20 28* 20 17  CREATININE 1.32* 1.31* 1.03 0.92  CALCIUM 9.3 8.9 9.1 8.6*  MG 1.7 1.9  --   --    GFR: Estimated Creatinine Clearance: 76.7 mL/min (by C-G formula based on SCr of 0.92 mg/dL). Liver Function Tests: Recent Labs  Lab 05/20/22 1400 05/22/22 1310 05/25/22 1442 05/26/22 0103  AST 40 68* 109* 93*  ALT 19 29 66* 64*  ALKPHOS 54 86 118 106  BILITOT 0.9 0.9 0.6 0.7  PROT 8.4* 7.5 7.5 7.4  ALBUMIN 3.5 3.0* 3.0* 2.9*   Recent Labs  Lab 05/25/22 1442 05/26/22 0103  LIPASE 31 28   No results for input(s): "AMMONIA" in the last 168 hours. Coagulation Profile: No results for input(s): "INR", "PROTIME" in the last 168 hours. Cardiac Enzymes: No results for input(s): "CKTOTAL", "CKMB", "CKMBINDEX", "TROPONINI" in the last 168 hours. BNP (last 3 results) No results for input(s): "PROBNP" in the last 8760 hours. HbA1C: No results for input(s): "HGBA1C" in the last 72 hours. CBG: No results for input(s): "GLUCAP" in the last 168 hours. Lipid Profile: No results for input(s): "CHOL", "HDL", "LDLCALC", "TRIG", "CHOLHDL", "LDLDIRECT" in the last 72 hours. Thyroid Function Tests: No results for input(s): "TSH", "T4TOTAL", "FREET4", "T3FREE", "THYROIDAB" in the last 72 hours. Anemia Panel: No results for input(s): "VITAMINB12", "FOLATE", "FERRITIN", "TIBC", "IRON", "RETICCTPCT" in the last 72 hours. Urine analysis:    Component Value Date/Time   COLORURINE YELLOW (A) 05/21/2022 0946   APPEARANCEUR HAZY (A) 05/21/2022 0946   LABSPEC 1.020 05/21/2022 0946   PHURINE 5.0 05/21/2022 0946    GLUCOSEU NEGATIVE 05/21/2022 0946   HGBUR SMALL (A) 05/21/2022 0946   BILIRUBINUR NEGATIVE 05/21/2022 0946   KETONESUR NEGATIVE 05/21/2022 0946   PROTEINUR 30 (A) 05/21/2022 0946   UROBILINOGEN 0.2 07/06/2011 0725   NITRITE NEGATIVE 05/21/2022 0946  LEUKOCYTESUR NEGATIVE 05/21/2022 0946    Radiological Exams on Admission: US Abdomen Limited RUQ (LIVER/GB)  Result Date: 05/25/2022 CLINICAL DATA:  Right upper quadrant pain. EXAM: ULTRASOUND ABDOMEN LIMITED RIGHT UPPER QUADRANT COMPARISON:  Abdominopelvic CT earlier today FINDINGS: Gallbladder: Distended containing sludge and gallstones. Edematous gallbladder wall thickening measuring from 6-10 mm. No definite pericholecystic fluid. No sonographic Murphy sign noted by sonographer. Common bile duct: Diameter: 6 mm.  No visualized choledocholithiasis. Liver: Technically limited evaluation due to high positioning of the liver. Heterogeneous parenchyma but no evidence of focal lesion. No capsular nodularity. Portal vein is patent on color Doppler imaging with normal direction of blood flow towards the liver. Other: No right upper quadrant ascites. IMPRESSION: 1. Gallstones with gallbladder distension and edematous gallbladder wall thickening. Findings are suspicious for acute cholecystitis, however there is no sonographic Murphy sign. 2. No biliary dilatation. Electronically Signed   By: Keith Rake M.D.   On: 05/25/2022 16:23   CT Abdomen Pelvis Wo Contrast  Result Date: 05/25/2022 CLINICAL DATA:  Abdominal pain EXAM: CT ABDOMEN AND PELVIS WITHOUT CONTRAST TECHNIQUE: Multidetector CT imaging of the abdomen and pelvis was performed following the standard protocol without IV contrast. RADIATION DOSE REDUCTION: This exam was performed according to the departmental dose-optimization program which includes automated exposure control, adjustment of the mA and/or kV according to patient size and/or use of iterative reconstruction technique. COMPARISON:   03/18/2022 FINDINGS: Lower chest: Loculated effusion seen in the posterior left lower lung field has not changed. There are linear densities in lingula with no significant change. There is increase in interstitial markings in the right lower lung fields suggesting scarring from chronic interstitial lung disease. Left hemidiaphragm is elevated. Small pericardial effusion is present. Coronary artery calcifications are seen. Hepatobiliary: There is no dilation of bile ducts. Gallbladder is distended. There is wall thickening in gallbladder. There is minimal amount of fluid around the gallbladder. Gallbladder stones are seen. There is 4 mm calcific density in the region of neck of the gallbladder. There are calcific densities in the distal course of common bile duct close to the ampulla. Pancreas: Pancreas appears smaller than usual in size. No focal abnormalities are seen. Spleen: Unremarkable. Adrenals/Urinary Tract: Adrenals are unremarkable.There is no hydronephrosis. Bilateral renal stones are seen. Largest of the stones is in the upper pole of left kidney measuring 11 mm. There is 2.7 cm cyst in the anterior midportion of left kidney. Ureters are not dilated. Urinary bladder is not distended. Stomach/Bowel: Stomach is unremarkable. Small bowel loops are not dilated. Appendix is not dilated. There is no significant wall thickening in colon. Few diverticula are seen in colon. Vascular/Lymphatic: There are scattered calcifications in aorta and its major branches. Reproductive: Prostate is not seen suggesting previous removal. Other: There is no ascites or pneumoperitoneum. Small umbilical hernia containing fat is seen. Small bilateral inguinal hernias containing fat are noted. Musculoskeletal: Compression fractures and vertebroplasty are noted in multiple lower thoracic and lumbar vertebral bodies. There is deformity in the right pubic bone which has not changed. IMPRESSION: Gallbladder is distended. Stones are noted  in the lumen of gallbladder, neck of gallbladder and distal common bile duct. There is pericholecystic stranding. Findings suggest acute cholecystitis. Follow-up gallbladder sonogram and surgical consultation should be considered. There is no evidence of intestinal obstruction or pneumoperitoneum. There is no hydronephrosis. Bilateral renal stones.  Left renal cyst. Loculated effusion is seen in the posterior left lower lung fields. Coronary artery calcifications are seen. Scarring is seen in the  lower lung fields. Other findings as described in the body of the report. These results will be called to the ordering clinician or representative by the Radiologist Assistant, and communication documented in the PACS or [employer]. Electronically Signed   By: Elmer Picker M.D.   On: 05/25/2022 09:19     Data Reviewed: Relevant notes from primary care and specialist visits, past discharge summaries as available in EHR, including Care Everywhere. Prior diagnostic testing as pertinent to current admission diagnoses Updated medications and problem lists for reconciliation ED course, including vitals, labs, imaging, treatment and response to treatment Triage notes, nursing and pharmacy notes and ED provider's notes Notable results as noted in HPI   Assessment and Plan: * Acute cholecystitis CT abdomen and pelvis earlier in the day showed possible CBD stone Ultrasound showed possible cholecystitis IV Zosyn, pain management and IV antiemetics and IV hydration Keep n.p.o. GI consult for possible ERCP    History of prostate cancer s/p prostatectomy/history of multiple myeloma in remission No acute disused  Lung cancer ([place]) On palliative chemotherapy   Chronic prescription opiate use On fentanyl patch 67mg     Anemia of chronic disease Hemoglobin at baseline       DVT prophylaxis: SCD  Consults: GI Dr LHaig Prophet Advance Care Planning:   Code Status: Prior   Family  Communication: none  Disposition Plan: Back to previous home environment  Severity of Illness: The appropriate patient status for this patient is INPATIENT. Inpatient status is judged to be reasonable and necessary in order to provide the required intensity of service to ensure the patient's safety. The patient's presenting symptoms, physical exam findings, and initial radiographic and laboratory data in the context of their chronic comorbidities is felt to place them at high risk for further clinical deterioration. Furthermore, it is not anticipated that the patient will be medically stable for discharge from the hospital within 2 midnights of admission.   * I certify that at the point of admission it is my clinical judgment that the patient will require inpatient hospital care spanning beyond 2 midnights from the point of admission due to high intensity of service, high risk for further deterioration and high frequency of surveillance required.*  Author: HAthena Masse MD 05/26/2022 3:14 AM  For on call review [email or web address]

## 2022-05-26 NOTE — ED Provider Notes (Signed)
[facility] Provider Note    Event Date/Time   First MD Initiated Contact with Patient 05/26/22 0011     (approximate)   History   Abdominal Pain   HPI  [name] is a 76 y.o. male who presents for evaluation of abdominal pain.  He was seen as an outpatient by his primary care provider and an outpatient CT scan was ordered.  It was concerning for possible cholecystitis.  His outpatient provider and/or family representative spoke with Dr. Hampton Abbot with general surgery who recommended that the patient come into the emergency department and be evaluated for possibly also needing an ERCP if he has evidence of choledocholithiasis.  The patient has been waiting a very long time due to overwhelming ED and hospital patient volume in the morning.  He said he is continue to have pain but it is not severe.  He has not had any vomiting but he does have some nausea.  He denies chest pain or shortness of breath.  He denies fever.     Physical Exam   Triage Vital Signs: ED Triage Vitals  Enc Vitals Group     BP 05/25/22 1436 116/71     Pulse Rate 05/25/22 1436 95     Resp 05/25/22 1436 18     Temp 05/25/22 1436 97.7 F (36.5 C)     Temp Source 05/25/22 1436 Oral     SpO2 05/25/22 1436 95 %     Weight 05/25/22 1440 79.4 kg (175 lb)     Height --      Head Circumference --      Peak Flow --      Pain Score 05/25/22 1440 6     Pain Loc --      Pain Edu? --      Excl. in [place]? --     Most recent vital signs: Vitals:   05/26/22 0203 05/26/22 0433  BP: (!) 152/86 (!) 148/89  Pulse: 70 69  Resp: 16 20  Temp:  97.6 F (36.4 C)  SpO2: 98% 99%     General: Awake, no distress.  CV:  Good peripheral perfusion.  Regular rate and rhythm. Resp:  Normal effort.  Lungs clear to auscultation. Abd:  No distention.  No peritonitis.  Patient has tenderness to palpation of the right upper quadrant but with equivocal Murphy sign.  No lower abdominal tenderness, no  guarding. Other:  The patient is in good spirits, laughing and joking with me in spite of his medical issues.   ED Results / Procedures / Treatments   Labs (all labs ordered are listed, but only abnormal results are displayed) Labs Reviewed  COMPREHENSIVE METABOLIC PANEL - Abnormal; Notable for the following components:      Result Value   Glucose, Bld 126 (*)    Albumin 3.0 (*)    AST 109 (*)    ALT 66 (*)    All other components within normal limits  CBC - Abnormal; Notable for the following components:   RBC 3.54 (*)    Hemoglobin 12.3 (*)    HCT 35.8 (*)    MCV 101.1 (*)    MCH 34.7 (*)    All other components within normal limits  COMPREHENSIVE METABOLIC PANEL - Abnormal; Notable for the following components:   Potassium 3.3 (*)    Calcium 8.6 (*)    Albumin 2.9 (*)    AST 93 (*)    ALT 64 (*)  All other components within normal limits  CBC WITH DIFFERENTIAL/PLATELET - Abnormal; Notable for the following components:   RBC 3.26 (*)    Hemoglobin 11.1 (*)    HCT 32.6 (*)    Monocytes Absolute 1.5 (*)    Abs Immature Granulocytes 0.09 (*)    All other components within normal limits  LIPASE, BLOOD  LIPASE, BLOOD  URINALYSIS, ROUTINE W REFLEX MICROSCOPIC     RADIOLOGY I viewed and interpreted the patient's CT scan of the abdomen and pelvis performed within the last 24 hours as an outpatient.  There is some concern for cholecystitis but no obvious pericholecystic fluid.  The radiologist reported that there is also choledocholithiasis with stones in the distal common bile duct.  I also viewed and interpreted the patient's ultrasound.  This redemonstrates concern for cholecystitis but there is no sonographic evidence of choledocholithiasis.    PROCEDURES:  Critical Care performed: No  Procedures   MEDICATIONS ORDERED IN ED: Medications  acetaminophen (TYLENOL) tablet 650 mg (has no administration in time range)    Or  acetaminophen (TYLENOL) suppository  650 mg (has no administration in time range)  ondansetron (ZOFRAN) tablet 4 mg (has no administration in time range)    Or  ondansetron (ZOFRAN) injection 4 mg (has no administration in time range)  lactated ringers infusion (has no administration in time range)  morphine (PF) 2 MG/ML injection 2 mg (has no administration in time range)  piperacillin-tazobactam (ZOSYN) IVPB 3.375 g (3.375 g Intravenous New Bag/Given 05/26/22 0445)  lactated ringers bolus 1,000 mL (0 mLs Intravenous Stopped 05/26/22 0308)     IMPRESSION / MDM / ASSESSMENT AND PLAN / ED COURSE  I reviewed the triage vital signs and the nursing notes.                              Differential diagnosis includes, but is not limited to, cholecystitis, biliary colic, choledocholithiasis, acid reflux.  Patient's presentation is most consistent with acute presentation with potential threat to life or bodily function.  Labs/studies ordered: CBC with differential, CMP, lipase, ultrasound of the right upper quadrant.  Vital signs have remained stable.  Patient waited for an extended period of time due to overwhelming ED and hospital patient volumes and boarding.  Given the amount of time spent in the waiting room, I repeated his CBC and his CMP to see if his LFTs are increasing.  He has a mild transaminitis but no hyperbilirubinemia and he has no leukocytosis.  I talked with the patient and his caregiver who is at bedside.  Given the concern on the CT scan that the patient had stones in the common bile duct and cholecystitis, we got an ultrasound tonight which redemonstrated probable cholecystitis but I am less convinced that he has persistent choledocholithiasis given his relative stability and mild lab changes over a period of time.  Once we repeat the lab work we will determine if he would benefit from surgery consultation directly or if he needs an MRCP or admission to medicine for ERCP.  The patient and his caregiver agree with the  plan.   Clinical Course as of 05/26/22 0758  Tue May 26, 2022  0125 CBC with Differential(!) Stable CBC with differential, hemoglobin has dropped slightly but no leukocytosis [CF]  0212 Comprehensive metabolic panel(!) I reviewed the repeat CMP.  Her LFTs remain stable with a normal total bilirubin and essentially stable electrolytes. [CF]  0212 I  consulted by phone with Dr. Peyton Najjar with general surgery.  He is going to review the case and the imaging and let me know his recommendations. [CF]  0231 Discussed case with Dr. Peyton Najjar.  Because of the report of there being some stones in the distal common bile duct, he recommends hospitalist admission for ERCP.  Because Dr. Allen Norris may not be available, I am consulting Dr. Haig Prophet with GI to see if an ERCP is possible.  The patient may need an MRCP in the meantime to verify the presence or absence of choledocholithiasis. [CF]  0300 Consulted with Dr. Haig Prophet.  We discussed the case.  I sent him the case by secure chat text and he confirmed the Dr. Allen Norris is on the schedule, although we cannot definitively say when the ERCP will happen.  The patient and his caregiver understand.  I am consulting the hospitalist service for admission. [CF]  [id number] I consulted and discussed the case by phone with Dr. Damita Dunnings with the hospitalist service.  She understands the plan and will admit. [CF]    Clinical Course User Index [CF] Hinda Kehr, MD     FINAL CLINICAL IMPRESSION(S) / ED DIAGNOSES   Final diagnoses:  Calculus of gallbladder with acute cholecystitis without obstruction     Rx / DC Orders   ED Discharge Orders     None        Note:  This document was prepared using Dragon voice recognition software and may include unintentional dictation errors.   Hinda Kehr, MD 05/26/22 [id number]

## 2022-05-26 NOTE — Assessment & Plan Note (Signed)
On palliative chemotherapy 

## 2022-05-26 NOTE — Assessment & Plan Note (Signed)
Blood pressure mildly elevated, not on any antihypertensives at home. Pain might be playing a role. -Continue to monitor

## 2022-05-26 NOTE — Transfer of Care (Signed)
Immediate Anesthesia Transfer of Care Note  Patient: [name]  Procedure(s) Performed: ENDOSCOPIC RETROGRADE CHOLANGIOPANCREATOGRAPHY (ERCP)  Patient Location: Endoscopy Unit  Anesthesia Type:General  Level of Consciousness: drowsy  Airway & Oxygen Therapy: Patient Spontanous Breathing  Post-op Assessment: Report given to RN and Post -op Vital signs reviewed and stable  Post vital signs: Reviewed and stable  Last Vitals:  Vitals Value Taken Time  BP 126/78 05/26/22 1633  Temp    Pulse 71 05/26/22 1634  Resp 6 05/26/22 1633  SpO2 100 % 05/26/22 1634  Vitals shown include unvalidated device data.  Last Pain:  Vitals:   05/26/22 1554  TempSrc: Temporal  PainSc: 0-No pain         Complications: No notable events documented.

## 2022-05-26 NOTE — Assessment & Plan Note (Signed)
S/p ERCP, sphincterotomy and removal of 1 stone. -Monitor for any postoperative pancreatitis bleeding -Start him on clear liquid

## 2022-05-26 NOTE — Consult Note (Signed)
Consultation  Referring Provider:  ED    Admit date: 10/31 Consult date        10/31 Reason for Consultation:   Choledocholithiasis           HPI:   [name] is a 76 y.o. 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 Patient's LFT's are relatively unremarkable. No significant fevers/chills. Mild RUQ pain. Patient is not on any blood thinners and has no prior history of gastric surgery.  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 Leonie Man)   CATARACT EXTRACTION W/ INTRAOCULAR LENS IMPLANT Bilateral    DEXA  05/2011   spine 0.7, L femur 0.0, R femur -0.6, normal   KIDNEY STONE SURGERY     KIDNEY STONE SURGERY  06/2009   Stone removed from bladder   MELANOMA EXCISION Left 06/05/2019   Procedure: REEXCISION MELANOMA OF LEFT POSTERIOR AURICLE;  Surgeon: Johnathan Hausen, MD;  Location: [facility];  Service: General;  Laterality: Left;    PORTACATH PLACEMENT  08/05/11   right sided portacath   PROSTATECTOMY  04/06/07   TOE AMPUTATION Left 2017   2nd toe left foot    Family History  Problem Relation Age of Onset   Pneumonia Mother    Stroke Father    Cancer Brother        lung   Kidney cancer Sister    Colon cancer Neg Hx      Social History   Tobacco Use   Smoking status: Former    Packs/day: 2.00    Years: 30.00    Total pack years: 60.00    Types: Cigarettes    Quit date: 07/27/1996    Years since quitting: 25.8   Smokeless tobacco: Never  Vaping Use   Vaping Use: Never used  Substance Use Topics   Alcohol use: No   Drug use: No    Prior to Admission medications   Medication Sig Start Date End Date Taking? Authorizing Provider  folic acid (FOLVITE) 1 MG tablet Take 1 tablet (1 mg total) by mouth daily. 01/19/22  Yes Curt Bears, MD  lidocaine-prilocaine (EMLA) cream Apply topically as needed. Apply to port site one hour before treatment and cover with plastic wrap 10/13/19  Yes Heath Lark, MD  aspirin 81 MG tablet Take 81 mg by mouth daily. Patient not taking: Reported on  05/26/2022    [provider]  B Complex Vitamins (VITAMIN-B COMPLEX) TABS Take 1 tablet by mouth daily.    [provider]  Calcium Carbonate-Vitamin D 600-400 MG-UNIT per tablet Take 1 tablet by mouth daily. Patient not taking: Reported on 03/17/2022    [provider]  cholecalciferol (VITAMIN D) 1000 UNITS tablet Take 1,000 Units by mouth daily. Patient not taking: Reported on 03/17/2022    [provider]  ciprofloxacin (CIPRO) 500 MG tablet Take 1 tablet (500 mg total) by mouth 2 (two) times daily. Patient not taking: Reported on 05/26/2022 05/22/22   Borders, Kirt Boys, NP  Cyanocobalamin (VITAMIN B 12 PO) Take 1,000 mg by mouth daily.    [provider]  fentaNYL (DURAGESIC) 75 MCG/HR Place 1 patch onto the skin every other day. 12/15/21   [provider]  mirtazapine  (REMERON) 15 MG tablet Take 15 mg by mouth at bedtime. Patient not taking: Reported on 03/17/2022 02/16/22   [provider]  NONFORMULARY OR COMPOUNDED ITEM Baclofn/amitrp/ketamn cream topically to feet TID prn for neuropathy    [provider]  ondansetron (ZOFRAN) 4 MG tablet Take 4 mg by mouth every 8 (eight) hours as needed. 02/16/22   [provider]  Oxycodone HCl 10 MG TABS Take 1-2 tablets by mouth 3 (three) times daily as needed. 02/10/22   [provider]  pantoprazole (PROTONIX) 40 MG tablet Take 1 tablet (40 mg total) by mouth daily. 03/17/22   Cammie Sickle, MD  potassium chloride SA (KLOR-CON M) 20 MEQ tablet 1 pill twice a day 05/06/22   Cammie Sickle, MD  prochlorperazine (COMPAZINE) 10 MG tablet Take 1 tablet (10 mg total) by mouth every 6 (six) hours as needed for nausea or vomiting. 01/19/22   Curt Bears, MD    Current Facility-Administered Medications  Medication Dose Route Frequency Provider Last Rate Last Admin   acetaminophen (TYLENOL) tablet 650 mg  650 mg Oral Q6H PRN Athena Masse, MD       Or   acetaminophen (TYLENOL) suppository 650 mg  650 mg Rectal Q6H PRN Athena Masse, MD       lactated ringers infusion   Intravenous Continuous Lorella Nimrod, MD 100 mL/hr at 05/26/22 1241 New Bag at 05/26/22 1241   morphine (PF) 2 MG/ML injection 2 mg  2 mg Intravenous Q2H PRN Athena Masse, MD       ondansetron [facility]) tablet 4 mg  4 mg Oral Q6H PRN Athena Masse, MD       Or   ondansetron [facility]) injection 4 mg  4 mg Intravenous Q6H PRN Athena Masse, MD       piperacillin-tazobactam (ZOSYN) IVPB 3.375 g  3.375 g Intravenous Q8H Renda Rolls, RPH 12.5 mL/hr at 05/26/22 0445 3.375 g at 05/26/22 0445   Current Outpatient Medications  Medication Sig Dispense Refill   folic acid (FOLVITE) 1 MG tablet Take 1 tablet (1 mg total) by mouth daily. 30 tablet 4   lidocaine-prilocaine (EMLA) cream Apply topically as  needed. Apply to port site one hour before treatment and cover with plastic wrap 30 g 2   aspirin 81 MG tablet Take 81 mg by mouth daily. (Patient not taking: Reported on 05/26/2022)     B Complex Vitamins (VITAMIN-B COMPLEX) TABS Take 1 tablet by mouth daily.     Calcium Carbonate-Vitamin D 600-400 MG-UNIT per tablet Take 1 tablet by mouth daily. (Patient not taking: Reported on  03/17/2022)     cholecalciferol (VITAMIN D) 1000 UNITS tablet Take 1,000 Units by mouth daily. (Patient not taking: Reported on 03/17/2022)     ciprofloxacin (CIPRO) 500 MG tablet Take 1 tablet (500 mg total) by mouth 2 (two) times daily. (Patient not taking: Reported on 05/26/2022) 10 tablet 0   Cyanocobalamin (VITAMIN B 12 PO) Take 1,000 mg by mouth daily.     fentaNYL (DURAGESIC) 75 MCG/HR Place 1 patch onto the skin every other day.     mirtazapine (REMERON) 15 MG tablet Take 15 mg by mouth at bedtime. (Patient not taking: Reported on 03/17/2022)     NONFORMULARY OR COMPOUNDED ITEM Baclofn/amitrp/ketamn cream topically to feet TID prn for neuropathy     ondansetron (ZOFRAN) 4 MG tablet Take 4 mg by mouth every 8 (eight) hours as needed.     Oxycodone HCl 10 MG TABS Take 1-2 tablets by mouth 3 (three) times daily as needed.     pantoprazole (PROTONIX) 40 MG tablet Take 1 tablet (40 mg total) by mouth daily. 90 tablet 3   potassium chloride SA (KLOR-CON M) 20 MEQ tablet 1 pill twice a day 60 tablet 3   prochlorperazine (COMPAZINE) 10 MG tablet Take 1 tablet (10 mg total) by mouth every 6 (six) hours as needed for nausea or vomiting. 30 tablet 0    Allergies as of 05/25/2022 - Review Complete 05/25/2022  Allergen Reaction Noted   Celebrex [celecoxib] Nausea Only 06/08/2011   Cymbalta [duloxetine hcl] Other (See Comments) and Hives 08/24/2013   Hydromorphone hcl Itching 06/08/2016   Meloxicam Itching 06/08/2011   Oxycontin [oxycodone] Itching 01/24/2016   Percocet [oxycodone-acetaminophen] Itching 06/08/2011   Vicodin  [hydrocodone-acetaminophen] Itching 06/26/2011   Lyrica [pregabalin] Rash 08/24/2013     Review of Systems:    All systems reviewed and negative except where noted in HPI.  Review of Systems  Constitutional:  Positive for malaise/fatigue. Negative for chills and fever.  Respiratory:  Negative for shortness of breath.   Cardiovascular:  Negative for chest pain.  Gastrointestinal:  Positive for diarrhea. Negative for blood in stool.  Musculoskeletal:  Negative for joint pain.  Skin:  Negative for rash.  Neurological:  Negative for focal weakness.  Psychiatric/Behavioral:  Negative for substance abuse.   All other systems reviewed and are negative.      Physical Exam:  Vital signs in last 24 hours: Temp:  [97.5 F (36.4 C)-97.8 F (36.6 C)] 97.5 F (36.4 C) (10/31 0912) Pulse Rate:  [64-95] 64 (10/31 0912) Resp:  [16-20] 18 (10/31 0912) BP: (85-153)/(59-91) 130/79 (10/31 0912) SpO2:  [95 %-100 %] 99 % (10/31 0912) Weight:  [79.4 kg] 79.4 kg (10/30 1440)   General:   Pleasant in NAD Head:  Normocephalic and atraumatic. Eyes:   No icterus.   Conjunctiva pink. Mouth: Mucosa pink moist, no lesions. Neck:  Supple; no masses felt Lungs:  No respiratory distress Abdomen:   Flat, soft, nondistended, nontender Msk:  No clubbing or cyanosis.  Neurologic:  Alert and  oriented x4;  Cranial nerves II-XII intact.  Skin:  Warm, dry, pink without significant lesions or rashes. Psych:  Alert and cooperative. Normal affect.  LAB RESULTS: Recent Labs    05/25/22 1442 05/26/22 0103  WBC 5.5 6.1  HGB 12.3* 11.1*  HCT 35.8* 32.6*  PLT 183 183   BMET Recent Labs    05/25/22 1442 05/26/22 0103  NA 137 136  K 3.7 3.3*  CL 106 105  CO2 23 24  GLUCOSE 126* 98  BUN 20 17  CREATININE 1.03 0.92  CALCIUM 9.1 8.6*   LFT Recent Labs    05/26/22 0103  PROT 7.4  ALBUMIN 2.9*  AST 93*  ALT 64*  ALKPHOS 106  BILITOT 0.7   PT/INR No results for input(s): "LABPROT", "INR" in  the last 72 hours.  STUDIES: US Abdomen Limited RUQ (LIVER/GB)  Result Date: 05/25/2022 CLINICAL DATA:  Right upper quadrant pain. EXAM: ULTRASOUND ABDOMEN LIMITED RIGHT UPPER QUADRANT COMPARISON:  Abdominopelvic CT earlier today FINDINGS: Gallbladder: Distended containing sludge and gallstones. Edematous gallbladder wall thickening measuring from 6-10 mm. No definite pericholecystic fluid. No sonographic Murphy sign noted by sonographer. Common bile duct: Diameter: 6 mm.  No visualized choledocholithiasis. Liver: Technically limited evaluation due to high positioning of the liver. Heterogeneous parenchyma but no evidence of focal lesion. No capsular nodularity. Portal vein is patent on color Doppler imaging with normal direction of blood flow towards the liver. Other: No right upper quadrant ascites. IMPRESSION: 1. Gallstones with gallbladder distension and edematous gallbladder wall thickening. Findings are suspicious for acute cholecystitis, however there is no sonographic Murphy sign. 2. No biliary dilatation. Electronically Signed   By: Keith Rake M.D.   On: 05/25/2022 16:23   CT Abdomen Pelvis Wo Contrast  Result Date: 05/25/2022 CLINICAL DATA:  Abdominal pain EXAM: CT ABDOMEN AND PELVIS WITHOUT CONTRAST TECHNIQUE: Multidetector CT imaging of the abdomen and pelvis was performed following the standard protocol without IV contrast. RADIATION DOSE REDUCTION: This exam was performed according to the departmental dose-optimization program which includes automated exposure control, adjustment of the mA and/or kV according to patient size and/or use of iterative reconstruction technique. COMPARISON:  03/18/2022 FINDINGS: Lower chest: Loculated effusion seen in the posterior left lower lung field has not changed. There are linear densities in lingula with no significant change. There is increase in interstitial markings in the right lower lung fields suggesting scarring from chronic interstitial lung  disease. Left hemidiaphragm is elevated. Small pericardial effusion is present. Coronary artery calcifications are seen. Hepatobiliary: There is no dilation of bile ducts. Gallbladder is distended. There is wall thickening in gallbladder. There is minimal amount of fluid around the gallbladder. Gallbladder stones are seen. There is 4 mm calcific density in the region of neck of the gallbladder. There are calcific densities in the distal course of common bile duct close to the ampulla. Pancreas: Pancreas appears smaller than usual in size. No focal abnormalities are seen. Spleen: Unremarkable. Adrenals/Urinary Tract: Adrenals are unremarkable.There is no hydronephrosis. Bilateral renal stones are seen. Largest of the stones is in the upper pole of left kidney measuring 11 mm. There is 2.7 cm cyst in the anterior midportion of left kidney. Ureters are not dilated. Urinary bladder is not distended. Stomach/Bowel: Stomach is unremarkable. Small bowel loops are not dilated. Appendix is not dilated. There is no significant wall thickening in colon. Few diverticula are seen in colon. Vascular/Lymphatic: There are scattered calcifications in aorta and its major branches. Reproductive: Prostate is not seen suggesting previous removal. Other: There is no ascites or pneumoperitoneum. Small umbilical hernia containing fat is seen. Small bilateral inguinal hernias containing fat are noted. Musculoskeletal: Compression fractures and vertebroplasty are noted in multiple lower thoracic and lumbar vertebral bodies. There is deformity in the right pubic bone which has not changed. IMPRESSION: Gallbladder is distended. Stones are noted in the lumen of gallbladder, neck of gallbladder and distal common bile duct. There is pericholecystic stranding. Findings suggest acute cholecystitis. Follow-up gallbladder  sonogram and surgical consultation should be considered. There is no evidence of intestinal obstruction or pneumoperitoneum. There  is no hydronephrosis. Bilateral renal stones.  Left renal cyst. Loculated effusion is seen in the posterior left lower lung fields. Coronary artery calcifications are seen. Scarring is seen in the lower lung fields. Other findings as described in the body of the report. These results will be called to the ordering clinician or representative by the Radiologist Assistant, and communication documented in the PACS or [employer]. Electronically Signed   By: Elmer Picker M.D.   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  Raylene Miyamoto MD, MPH [place]

## 2022-05-26 NOTE — Assessment & Plan Note (Addendum)
CT abdomen and pelvis earlier in the day showed possible CBD stone Ultrasound showed possible cholecystitis IV Zosyn, pain management and IV antiemetics and IV hydration S/p ERCP

## 2022-05-26 NOTE — Progress Notes (Signed)
Pharmacy Antibiotic Note  [name] is a 76 y.o. male admitted on 05/26/2022 with intra-abdominal infection.  Pharmacy has been consulted for Zosyn dosing.  Plan: Zosyn 3.375g IV q8h (4 hour infusion).  Pharmacy will continue to follow and will adjust abx dosing whenever warranted.  Temp (24hrs), Avg:97.7 F (36.5 C), Min:97.5 F (36.4 C), Max:97.8 F (36.6 C)   Recent Labs  Lab 05/20/22 1400 05/22/22 1310 05/25/22 1442 05/26/22 0103  WBC 23.1* 13.4* 5.5 6.1  CREATININE 1.32* 1.31* 1.03 0.92    Estimated Creatinine Clearance: 76.7 mL/min (by C-G formula based on SCr of 0.92 mg/dL).    Allergies  Allergen Reactions   Celebrex [Celecoxib] Nausea Only   Cymbalta [Duloxetine Hcl] Other (See Comments) and Hives    Weight loss Weight loss   Hydromorphone Hcl Itching   Meloxicam Itching   Oxycontin [Oxycodone] Itching    Also caused constipation, insomnia Also caused constipation, insomnia Also caused constipation, insomnia   Percocet [Oxycodone-Acetaminophen] Itching   Vicodin [Hydrocodone-Acetaminophen] Itching   Lyrica [Pregabalin] Rash    Lesions    Antimicrobials this admission: 10/31 Zosyn >>   Microbiology results: No labs cx currently ordered or pending at this time.  Thank you for allowing pharmacy to be a part of this patient's care.  Renda Rolls, PharmD, [facility] 05/26/2022 3:40 AM

## 2022-05-26 NOTE — Anesthesia Preprocedure Evaluation (Addendum)
Anesthesia Evaluation  Patient identified by MRN, date of birth, ID band Patient awake    Reviewed: Allergy & Precautions, NPO status , Patient's Chart, lab work & pertinent test results  Airway Mallampati: I  TM Distance: >3 FB Neck ROM: full    Dental  (+) Missing   Pulmonary shortness of breath and with exertion, former smoker,  stage IV non-small cell lung cancer on palliative chemotherapy and immunotherapy  CXR: Stable left pleural effusion and pleural thickening is noted with associated left basilar atelectasis or scarring.   Pulmonary exam normal        Cardiovascular hypertension, Normal cardiovascular exam+ dysrhythmias   ECHO: 1. Left ventricular ejection fraction, by estimation, is 55 to 60%. The  left ventricle has normal function. The left ventricle has no regional  wall motion abnormalities. There is mild concentric left ventricular  hypertrophy. Left ventricular diastolic  parameters are consistent with Grade I diastolic dysfunction (impaired  relaxation).  2. Right ventricular systolic function is normal. The right ventricular  size is normal. There is normal pulmonary artery systolic pressure.  3. Left atrial size was mildly dilated.  4. The mitral valve is degenerative. Mild mitral valve regurgitation.  5. The aortic valve is tricuspid. There is mild calcification of the  aortic valve. There is mild thickening of the aortic valve. Aortic valve  regurgitation is not visualized. Aortic valve sclerosis is present, with  no evidence of aortic valve stenosis.  6. The inferior vena cava is normal in size with greater than 50%  respiratory variability, suggesting right atrial pressure of 3 mmHg.   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   Neuro/Psych Peripheral neuropathy negative neurological ROS  negative psych ROS   GI/Hepatic Neg liver ROS, Choledocholithiasis    Endo/Other  negative endocrine ROS  Renal/GU negative Renal ROS  negative genitourinary   Musculoskeletal diffuse scattered osseous metastatic disease   Abdominal Normal abdominal exam  (+)   Peds  Hematology  (+) Blood dyscrasia, anemia , Multiple Myeloma in relapse    Anesthesia Other Findings Past Medical History: No date: Allergic rhinitis 07/2010: Anemia     Comment:  secondary to tx rsponsive to [facility] 2012, 2013: Blood transfusion without reported diagnosis 09/30/11 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: Cancer ([place])     Comment:  diffuse scattered osseous metastatic disease No date: DDD (degenerative disc disease) No date: Fractures     Comment:  compression T12,L3 No date: GERD (gastroesophageal reflux disease) No date: History of chemotherapy     Comment:  weekly Velcade,Cytoxan No date: HTN (hypertension) No date: Hypercalcemia of malignancy No date: Hyperlipemia 07/09/2011: Multiple myeloma No date: Multiple myeloma No date: Myeloma kidney ([place]) No date: Peripheral neuropathy     Comment:  toes, sees Dr Krista Blue 2008: Prostate cancer [facility])     Comment:  s/p prostatectomy 03/2002: Prostate cancer ([place]) No date: Renal insufficiency  Past Surgical History: 10/22/2020: BASAL CELL CARCINOMA EXCISION; Left     Comment:  [facility] (Dr. Leonie Man) No date: CATARACT EXTRACTION W/ INTRAOCULAR LENS IMPLANT; Bilateral 05/2011: DEXA     Comment:  spine 0.7, L femur 0.0, R femur -0.6, normal No date: KIDNEY STONE SURGERY 06/2009: KIDNEY STONE SURGERY     Comment:  Stone removed from bladder 06/05/2019: MELANOMA EXCISION; Left     Comment:  Procedure: REEXCISION MELANOMA OF LEFT POSTERIOR               AURICLE;  Surgeon: Johnathan Hausen, MD;  Location: [facility];  Service: General;  Laterality:               Left; 08/05/11: PORTACATH PLACEMENT     Comment:  right sided portacath 04/06/07: PROSTATECTOMY 2017: TOE AMPUTATION; Left     Comment:  2nd toe left  foot  BMI    Body Mass Index: 23.09 kg/m      Reproductive/Obstetrics negative OB ROS                           Anesthesia Physical Anesthesia Plan  ASA: 3  Anesthesia Plan: General   Post-op Pain Management: Minimal or no pain anticipated   Induction: Intravenous  PONV Risk Score and Plan: Propofol infusion and TIVA  Airway Management Planned: Natural Airway  Additional Equipment:   Intra-op Plan:   Post-operative Plan:   Informed Consent: I have reviewed the patients History and Physical, chart, labs and discussed the procedure including the risks, benefits and alternatives for the proposed anesthesia with the patient or authorized representative who has indicated his/her understanding and acceptance.     Dental Advisory Given  Plan Discussed with: Anesthesiologist, CRNA and Surgeon  Anesthesia Plan Comments:        Anesthesia Quick Evaluation

## 2022-05-26 NOTE — Telephone Encounter (Signed)
Met patient emergency room-reviewed results of the CT scan.  Currently admitted to hospital.   Please cancel-appointment for November 1 st- and re-schedule to NOV 14th. Thanks GB

## 2022-05-26 NOTE — Progress Notes (Signed)
Progress Note   Patient: [name] LZJ:[id number] DOB: 1945/12/06 DOA: 05/26/2022     0 DOS: the patient was seen and examined on 05/26/2022   Brief hospital course: Taken from H&P.  HPI: [name] is a 75 y.o. male with medical history significant for HTN, prostate cancer s/p prostatectomy 2003, multiple myeloma in remission and more recently stage IV non-small cell lung cancer on palliative chemotherapy and immunotherapy, presents to the ED with a complaint of abdominal pain.  Patient recently saw his PCP for the same and outpatient CT scan was concerning for cholecystitis.  He spoke with surgeon, Dr. Hampton Boyer who recommended reevaluation in the ED.  Patient endorses nausea but no vomiting.  He denies fever or chills.   ED course and data review:Vitals initially within normal limits though BP did dip as low as 85/59, fluid responsive to 152/86.  Blood work with normal WBC.  Mild transaminitis with AST 93 and ALT 64.  Lipase 28. Right upper quadrant ultrasound showing the following: IMPRESSION: 1. Gallstones with gallbladder distension and edematous gallbladder wall thickening. Findings are suspicious for acute cholecystitis, however there is no sonographic Murphy sign. 2. No biliary dilatation.   Outpatient CT abdomen and pelvis done earlier in the day showed the following IMPRESSION: Gallbladder is distended. Stones are noted in the lumen of gallbladder, neck of gallbladder and distal common bile duct. There is pericholecystic stranding. Findings suggest acute cholecystitis. Follow-up gallbladder sonogram and surgical consultation should be considered.  General surgery and GI was consulted . Patient was started on Zosyn.  10/31: Patient was taken for ERCP by GI today.  Found to have choledocholithiasis, 1 stone was removed.  Tolerated the procedure well.   Assessment and Plan: * Acute cholecystitis CT abdomen and pelvis earlier in the day showed possible CBD  stone Ultrasound showed possible cholecystitis IV Zosyn, pain management and IV antiemetics and IV hydration S/p ERCP   Calculus of common duct S/p ERCP, sphincterotomy and removal of 1 stone. -Monitor for any postoperative pancreatitis bleeding -Start him on clear liquid  Lung cancer ([place]) On palliative chemotherapy  History of prostate cancer s/p prostatectomy/history of multiple myeloma in remission No acute issues  Essential hypertension, benign Blood pressure mildly elevated, not on any antihypertensives at home. Pain might be playing a role. -Continue to monitor    Subjective: Patient was seen and examined today.  Currently pain well controlled, he was feeling hungry, waiting for ERCP.  Physical Exam: Vitals:   05/26/22 1653 05/26/22 1703 05/26/22 1713 05/26/22 1723  BP: 135/86 (!) 155/85 (!) 146/81 (!) 145/77  Pulse: 65 62 (!) 57 (!) 58  Resp: _0 Temp:      TempSrc:      SpO2: 92% 100% 100% 100%  Weight:      Height:       General.  [name], in no acute distress. Pulmonary.  Lungs clear bilaterally, normal respiratory effort. CV.  Regular rate and rhythm, no JVD, rub or murmur. Abdomen.  Soft, nontender, nondistended, BS positive. CNS.  Alert and oriented .  No focal neurologic deficit. Extremities.  No edema, no cyanosis, pulses intact and symmetrical. Psychiatry.  Judgment and insight appears normal.  Data Reviewed: Prior data reviewed  Family Communication: Discussed with wife and a caregiver at bedside  Disposition: Status is: Inpatient Remains inpatient appropriate because: Severity of illness   Planned Discharge Destination: Home with Home Health  Time spent:  Minutes  This record has been  created using Systems analyst. Errors have been sought and corrected,but may not always be located. Such creation errors do not reflect on the standard of care.  Author: Lorella Nimrod, MD 05/26/2022 5:49 PM  For  on call review [email or web address].

## 2022-05-26 NOTE — Anesthesia Procedure Notes (Signed)
Date/Time: 05/26/2022 4:17 PM  Performed by: Lily Peer, Camdyn Laden, CRNAPre-anesthesia Checklist: Patient identified, Emergency Drugs available, Suction available, Timeout performed and Patient being monitored Patient Re-evaluated:Patient Re-evaluated prior to induction Oxygen Delivery Method: Simple face mask Induction Type: IV induction

## 2022-05-27 ENCOUNTER — Inpatient Hospital Stay: Payer: Medicare Other

## 2022-05-27 ENCOUNTER — Encounter: Payer: Self-pay | Admitting: Gastroenterology

## 2022-05-27 ENCOUNTER — Inpatient Hospital Stay: Payer: Medicare Other | Admitting: Internal Medicine

## 2022-05-27 DIAGNOSIS — K810 Acute cholecystitis: Secondary | ICD-10-CM | POA: Diagnosis not present

## 2022-05-27 LAB — COMPREHENSIVE METABOLIC PANEL
ALT: 86 U/L — ABNORMAL HIGH (ref 0–44)
AST: 137 U/L — ABNORMAL HIGH (ref 15–41)
Albumin: 2.4 g/dL — ABNORMAL LOW (ref 3.5–5.0)
Alkaline Phosphatase: 122 U/L (ref 38–126)
Anion gap: 5 (ref 5–15)
BUN: 14 mg/dL (ref 8–23)
CO2: 25 mmol/L (ref 22–32)
Calcium: 8.6 mg/dL — ABNORMAL LOW (ref 8.9–10.3)
Chloride: 107 mmol/L (ref 98–111)
Creatinine, Ser: 1.16 mg/dL (ref 0.61–1.24)
GFR, Estimated: >60 mL/min (ref >60)
Glucose, Bld: 113 mg/dL — ABNORMAL HIGH (ref 70–99)
Potassium: 3.7 mmol/L (ref 3.5–5.1)
Sodium: 137 mmol/L (ref 135–145)
Total Bilirubin: 1.1 mg/dL (ref 0.3–1.2)
Total Protein: 6.1 g/dL — ABNORMAL LOW (ref 6.5–8.1)

## 2022-05-27 LAB — CBC
HCT: 31.3 % — ABNORMAL LOW (ref 39.0–52.0)
Hemoglobin: 10.8 g/dL — ABNORMAL LOW (ref 13.0–17.0)
MCH: 34.5 pg — ABNORMAL HIGH (ref 26.0–34.0)
MCHC: 34.5 g/dL (ref 30.0–36.0)
MCV: 100 fL (ref 80.0–100.0)
Platelets: 174 10*3/uL (ref 150–400)
RBC: 3.13 MIL/uL — ABNORMAL LOW (ref 4.22–5.81)
RDW: 12.1 % (ref 11.5–15.5)
WBC: 4.7 10*3/uL (ref 4.0–10.5)
nRBC: 0 % (ref 0.0–0.2)

## 2022-05-27 LAB — LIPASE, BLOOD: Lipase: 28 U/L (ref 11–51)

## 2022-05-27 MED ORDER — CHLORHEXIDINE GLUCONATE CLOTH 2 % EX PADS
6.0000 | MEDICATED_PAD | Freq: Every day | CUTANEOUS | Status: DC
  Administered 2022-05-28 – 2022-05-29 (×2): 6 via TOPICAL

## 2022-05-27 MED ORDER — FENTANYL 75 MCG/HR TD PT72
1.0000 | MEDICATED_PATCH | TRANSDERMAL | Status: DC
  Administered 2022-05-27 – 2022-05-29 (×2): 1 via TRANSDERMAL
  Filled 2022-05-27 (×2): qty 1

## 2022-05-27 NOTE — H&P (View-Only) (Signed)
Patient ID: [name], male   DOB: [birth year], 76 y.o.   MRN: [id number]     [place] Hospital Day(s): 1.   Interval History: Patient seen and examined, no acute events or new complaints overnight. Patient reports feeling well this morning.  Denies significant domino pain.  No premedication.  No alleviating or aggravating factors.  Vital signs in last 24 hours: [min-max] current  Temp:  [96 F (35.6 C)-98.6 F (37 C)] 97.7 F (36.5 C) (11/01 0815) Pulse Rate:  [57-74] 68 (11/01 0815) Resp:  [6-20] 18 (11/01 0815) BP: (123-156)/(74-89) 125/85 (11/01 0815) SpO2:  [92 %-100 %] 99 % (11/01 0815) Weight:  [71.2 kg] 71.2 kg (10/31 1554)     Height: 6\' 1"  (185.4 cm) Weight: 71.2 kg BMI (Calculated): 20.72   Physical Exam:  Constitutional: alert, cooperative and no distress  Respiratory: breathing non-labored at rest  Cardiovascular: regular rate and sinus rhythm  Gastrointestinal: soft, non-tender, and non-distended  Labs:     Latest Ref Rng & Units 05/27/2022    9:36 AM 05/26/2022    1:03 AM 05/25/2022    2:42 PM  CBC  WBC 4.0 - 10.5 K/uL 4.7  6.1  5.5   Hemoglobin 13.0 - 17.0 g/dL 10.8  11.1  12.3   Hematocrit 39.0 - 52.0 % 31.3  32.6  35.8   Platelets 150 - 400 K/uL 174  183  183       Latest Ref Rng & Units 05/27/2022    9:36 AM 05/26/2022    1:03 AM 05/25/2022    2:42 PM  CMP  Glucose 70 - 99 mg/dL 113  98  126   BUN 8 - 23 mg/dL 14  17  20    Creatinine 0.61 - 1.24 mg/dL 1.16  0.92  1.03   Sodium 135 - 145 mmol/L 137  136  137   Potassium 3.5 - 5.1 mmol/L 3.7  3.3  3.7   Chloride 98 - 111 mmol/L 107  105  106   CO2 22 - 32 mmol/L 25  24  23    Calcium 8.9 - 10.3 mg/dL 8.6  8.6  9.1   Total Protein 6.5 - 8.1 g/dL 6.1  7.4  7.5   Total Bilirubin 0.3 - 1.2 mg/dL 1.1  0.7  0.6   Alkaline Phos 38 - 126 U/L 122  106  118   AST 15 - 41 U/L 137  93  109   ALT 0 - 44 U/L 86  64  66     Imaging studies: No new pertinent imaging  studies   Assessment/Plan:  76 y.o. male with cholecystitis with choledocholithiasis 1 Day Post-Op s/p ERCP, complicated by pertinent comorbidities including lung cancer.  Patient had ERCP yesterday.  Seems to be doing well.  No sign of pancreatitis.  Patient can continue clear liquids today and will coordinate cholecystectomy tomorrow.  Patient interval cholecystectomy.  He was admitted about the risk includes infection, bleeding, bile leak, injury to common bile duct, injury to adjacent organs among others.  The patient reported he understood and agreed to proceed.  Arnold Long, MD

## 2022-05-27 NOTE — Plan of Care (Signed)

## 2022-05-27 NOTE — Assessment & Plan Note (Signed)
S/p ERCP, sphincterotomy and removal of 1 stone. -Monitor for any postoperative pancreatitis bleeding -Start him on clear liquid

## 2022-05-27 NOTE — Assessment & Plan Note (Signed)
CT abdomen and pelvis earlier in the day showed possible CBD stone Ultrasound showed possible cholecystitis IV Zosyn, pain management and IV antiemetics and IV hydration S/p ERCP Going for cholecystectomy tomorrow

## 2022-05-27 NOTE — Anesthesia Postprocedure Evaluation (Signed)
Anesthesia Post Note  Patient: [name]  Procedure(s) Performed: ENDOSCOPIC RETROGRADE CHOLANGIOPANCREATOGRAPHY (ERCP)  Patient location during evaluation: Endoscopy Anesthesia Type: General Level of consciousness: awake and alert Pain management: pain level controlled Vital Signs Assessment: post-procedure vital signs reviewed and stable Respiratory status: spontaneous breathing, nonlabored ventilation and respiratory function stable Cardiovascular status: blood pressure returned to baseline and stable Postop Assessment: no apparent nausea or vomiting Anesthetic complications: no   No notable events documented.   Last Vitals:  Vitals:   05/26/22 1848 05/26/22 2010  BP: (!) 146/82 129/74  Pulse: 61 65  Resp: 16 18  Temp: 37 C 36.7 C  SpO2: 100% 100%    Last Pain:  Vitals:   05/26/22 2010  TempSrc: Oral  PainSc: 0-No pain                 Iran Ouch

## 2022-05-27 NOTE — Progress Notes (Signed)
Patient ID: [name], male   DOB: 1946/05/17, 76 y.o.   MRN: [id number]     [place] Hospital Day(s): 1.   Interval History: Patient seen and examined, no acute events or new complaints overnight. Patient reports feeling well this morning.  Denies significant domino pain.  No premedication.  No alleviating or aggravating factors.  Vital signs in last 24 hours: [min-max] current  Temp:  [96 F (35.6 C)-98.6 F (37 C)] 97.7 F (36.5 C) (11/01 0815) Pulse Rate:  [57-74] 68 (11/01 0815) Resp:  [6-20] 18 (11/01 0815) BP: (123-156)/(74-89) 125/85 (11/01 0815) SpO2:  [92 %-100 %] 99 % (11/01 0815) Weight:  [71.2 kg] 71.2 kg (10/31 1554)     Height: 6\' 1"  (185.4 cm) Weight: 71.2 kg BMI (Calculated): 20.72   Physical Exam:  Constitutional: alert, cooperative and no distress  Respiratory: breathing non-labored at rest  Cardiovascular: regular rate and sinus rhythm  Gastrointestinal: soft, non-tender, and non-distended  Labs:     Latest Ref Rng & Units 05/27/2022    9:36 AM 05/26/2022    1:03 AM 05/25/2022    2:42 PM  CBC  WBC 4.0 - 10.5 K/uL 4.7  6.1  5.5   Hemoglobin 13.0 - 17.0 g/dL 10.8  11.1  12.3   Hematocrit 39.0 - 52.0 % 31.3  32.6  35.8   Platelets 150 - 400 K/uL 174  183  183       Latest Ref Rng & Units 05/27/2022    9:36 AM 05/26/2022    1:03 AM 05/25/2022    2:42 PM  CMP  Glucose 70 - 99 mg/dL 113  98  126   BUN 8 - 23 mg/dL 14  17  20    Creatinine 0.61 - 1.24 mg/dL 1.16  0.92  1.03   Sodium 135 - 145 mmol/L 137  136  137   Potassium 3.5 - 5.1 mmol/L 3.7  3.3  3.7   Chloride 98 - 111 mmol/L 107  105  106   CO2 22 - 32 mmol/L 25  24  23    Calcium 8.9 - 10.3 mg/dL 8.6  8.6  9.1   Total Protein 6.5 - 8.1 g/dL 6.1  7.4  7.5   Total Bilirubin 0.3 - 1.2 mg/dL 1.1  0.7  0.6   Alkaline Phos 38 - 126 U/L 122  106  118   AST 15 - 41 U/L 137  93  109   ALT 0 - 44 U/L 86  64  66     Imaging studies: No new pertinent imaging  studies   Assessment/Plan:  76 y.o. male with cholecystitis with choledocholithiasis 1 Day Post-Op s/p ERCP, complicated by pertinent comorbidities including lung cancer.  Patient had ERCP yesterday.  Seems to be doing well.  No sign of pancreatitis.  Patient can continue clear liquids today and will coordinate cholecystectomy tomorrow.  Patient interval cholecystectomy.  He was admitted about the risk includes infection, bleeding, bile leak, injury to common bile duct, injury to adjacent organs among others.  The patient reported he understood and agreed to proceed.  Arnold Long, MD

## 2022-05-27 NOTE — Progress Notes (Signed)
Progress Note   Patient: [name] FXT:[id number] DOB: [birth year] DOA: 05/26/2022     1 DOS: the patient was seen and examined on 05/27/2022   Brief hospital course: Taken from H&P.  HPI: BENN TARVER is a 76 y.o. male with medical history significant for HTN, prostate cancer s/p prostatectomy 2003, multiple myeloma in remission and more recently stage IV non-small cell lung cancer on palliative chemotherapy and immunotherapy, presents to the ED with a complaint of abdominal pain.  Patient recently saw his PCP for the same and outpatient CT scan was concerning for cholecystitis.  He spoke with surgeon, Dr. Hampton Abbot who recommended reevaluation in the ED.  Patient endorses nausea but no vomiting.  He denies fever or chills.   ED course and data review:Vitals initially within normal limits though BP did dip as low as 85/59, fluid responsive to 152/86.  Blood work with normal WBC.  Mild transaminitis with AST 93 and ALT 64.  Lipase 28. Right upper quadrant ultrasound showing the following: IMPRESSION: 1. Gallstones with gallbladder distension and edematous gallbladder wall thickening. Findings are suspicious for acute cholecystitis, however there is no sonographic Murphy sign. 2. No biliary dilatation.   Outpatient CT abdomen and pelvis done earlier in the day showed the following IMPRESSION: Gallbladder is distended. Stones are noted in the lumen of gallbladder, neck of gallbladder and distal common bile duct. There is pericholecystic stranding. Findings suggest acute cholecystitis. Follow-up gallbladder sonogram and surgical consultation should be considered.  General surgery and GI was consulted . Patient was started on Zosyn.  10/31: Patient was taken for ERCP by GI today.  Found to have choledocholithiasis, 1 stone was removed.  Tolerated the procedure well.  11/1: S/p ERCP with removal of 1 stone from bile duct.  General surgery is planning cholecystectomy tomorrow.  Patient  will remain on clear liquid diet and Zosyn today.   Assessment and Plan: * Acute cholecystitis CT abdomen and pelvis earlier in the day showed possible CBD stone Ultrasound showed possible cholecystitis IV Zosyn, pain management and IV antiemetics and IV hydration S/p ERCP Going for cholecystectomy tomorrow   Calculus of common duct S/p ERCP, sphincterotomy and removal of 1 stone. -Monitor for any postoperative pancreatitis bleeding -Start him on clear liquid  Lung cancer ([place]) On palliative chemotherapy  History of prostate cancer s/p prostatectomy/history of multiple myeloma in remission No acute issues  Essential hypertension, benign Blood pressure mildly elevated, not on any antihypertensives at home. Pain might be playing a role. -Continue to monitor   Subjective: Patient was seen and examined today.  Denies any pain.  No nausea or vomiting. Wife and caregiver at bedside  Physical Exam: Vitals:   05/26/22 1723 05/26/22 1848 05/26/22 2010 05/27/22 0815  BP: (!) 145/77 (!) 146/82 129/74 125/85  Pulse: (!) 58 61 65 68  Resp: _0 Temp:  98.6 F (37 C) 98.1 F (36.7 C) 97.7 F (36.5 C)  TempSrc:  Oral Oral   SpO2: 100% 100% 100% 99%  Weight:      Height:       General.  Frail elderly man, in no acute distress. Pulmonary.  Lungs clear bilaterally, normal respiratory effort. CV.  Regular rate and rhythm, no JVD, rub or murmur. Abdomen.  Soft, nontender, nondistended, BS positive. CNS.  Alert and oriented .  No focal neurologic deficit. Extremities.  No edema, no cyanosis, pulses intact and symmetrical. Psychiatry.  Judgment and insight appears normal.   Data Reviewed: Prior  data reviewed  Family Communication: Discussed with wife and a caregiver at bedside  Disposition: Status is: Inpatient Remains inpatient appropriate because: Severity of illness   Planned Discharge Destination: Home with Home Health  Time spent: 50 Minutes  This record has  been created using Systems analyst. Errors have been sought and corrected,but may not always be located. Such creation errors do not reflect on the standard of care.  Author: Lorella Nimrod, MD 05/27/2022 4:12 PM  For on call review [email or web address].

## 2022-05-27 NOTE — Care Plan (Signed)
Patient with no post ERCP pain. Surgical evaluation in progress. GI will sign-off, please call with any questions or concerns.  Raylene Miyamoto MD, MPH [place]

## 2022-05-27 NOTE — Plan of Care (Signed)
  Problem: Education: Goal: Knowledge of General Education information will improve Description: Including pain rating scale, medication(s)/side effects and non-pharmacologic comfort measures Outcome: Progressing   Problem: Clinical Measurements: Goal: Ability to maintain clinical measurements within normal limits will improve Outcome: Progressing Goal: Will remain free from infection Outcome: Progressing   

## 2022-05-28 ENCOUNTER — Encounter: Payer: Self-pay | Admitting: Internal Medicine

## 2022-05-28 ENCOUNTER — Encounter
Admission: EM | Disposition: A | Discharge status: Home / Self Care | Payer: Self-pay | Source: Home / Self Care | Attending: Internal Medicine

## 2022-05-28 ENCOUNTER — Inpatient Hospital Stay: Payer: Medicare Other | Admitting: Anesthesiology

## 2022-05-28 ENCOUNTER — Other Ambulatory Visit: Payer: Self-pay

## 2022-05-28 DIAGNOSIS — K810 Acute cholecystitis: Secondary | ICD-10-CM | POA: Diagnosis not present

## 2022-05-28 LAB — CEA: CEA: 47.9 ng/mL — ABNORMAL HIGH (ref 0.0–4.7)

## 2022-05-28 SURGERY — CHOLECYSTECTOMY, ROBOT-ASSISTED, LAPAROSCOPIC
Anesthesia: General | Site: Abdomen

## 2022-05-28 MED ORDER — EPHEDRINE SULFATE (PRESSORS) 50 MG/ML IJ SOLN
INTRAMUSCULAR | Status: DC | PRN
  Administered 2022-05-28: 5 mg via INTRAVENOUS

## 2022-05-28 MED ORDER — FENTANYL CITRATE (PF) 100 MCG/2ML IJ SOLN
INTRAMUSCULAR | Status: DC | PRN
  Administered 2022-05-28 (×2): 25 ug via INTRAVENOUS
  Administered 2022-05-28: 50 ug via INTRAVENOUS

## 2022-05-28 MED ORDER — LACTATED RINGERS IV SOLN: INTRAVENOUS | Status: DC

## 2022-05-28 MED ORDER — CHLORHEXIDINE GLUCONATE 0.12 % MT SOLN
OROMUCOSAL | Status: AC
  Filled 2022-05-28: qty 15

## 2022-05-28 MED ORDER — FENTANYL CITRATE (PF) 100 MCG/2ML IJ SOLN
INTRAMUSCULAR | Status: AC
  Administered 2022-05-28: 25 ug via INTRAVENOUS
  Filled 2022-05-28: qty 2

## 2022-05-28 MED ORDER — ROCURONIUM BROMIDE 100 MG/10ML IV SOLN
INTRAVENOUS | Status: DC | PRN
  Administered 2022-05-28: 40 mg via INTRAVENOUS

## 2022-05-28 MED ORDER — CHLORHEXIDINE GLUCONATE 0.12 % MT SOLN
15.0000 mL | Freq: Once | OROMUCOSAL | Status: AC
  Administered 2022-05-28: 15 mL via OROMUCOSAL

## 2022-05-28 MED ORDER — ONDANSETRON HCL 4 MG/2ML IJ SOLN: 4.0000 mg | Freq: Once | INTRAMUSCULAR | Status: DC | PRN

## 2022-05-28 MED ORDER — ORAL CARE MOUTH RINSE: 15.0000 mL | Freq: Once | OROMUCOSAL | Status: AC

## 2022-05-28 MED ORDER — SODIUM CHLORIDE 0.9 % IR SOLN
Status: DC | PRN
  Administered 2022-05-28: 1000 mL

## 2022-05-28 MED ORDER — PHENYLEPHRINE HCL (PRESSORS) 10 MG/ML IV SOLN
INTRAVENOUS | Status: DC | PRN
  Administered 2022-05-28 (×5): 80 ug via INTRAVENOUS

## 2022-05-28 MED ORDER — FENTANYL CITRATE (PF) 100 MCG/2ML IJ SOLN
25.0000 ug | INTRAMUSCULAR | Status: AC | PRN
  Administered 2022-05-28 (×4): 25 ug via INTRAVENOUS

## 2022-05-28 MED ORDER — INDOCYANINE GREEN 25 MG IV SOLR
1.2500 mg | Freq: Once | INTRAVENOUS | Status: AC
  Administered 2022-05-28: 1.25 mg via INTRAVENOUS
  Filled 2022-05-28: qty 0.5

## 2022-05-28 MED ORDER — LABETALOL HCL 5 MG/ML IV SOLN
INTRAVENOUS | Status: DC | PRN
  Administered 2022-05-28: 5 mg via INTRAVENOUS

## 2022-05-28 MED ORDER — SUGAMMADEX SODIUM 200 MG/2ML IV SOLN
INTRAVENOUS | Status: DC | PRN
  Administered 2022-05-28: 200 mg via INTRAVENOUS

## 2022-05-28 MED ORDER — BUPIVACAINE-EPINEPHRINE (PF) 0.25% -1:200000 IJ SOLN
INTRAMUSCULAR | Status: AC
  Filled 2022-05-28: qty 30

## 2022-05-28 MED ORDER — IPRATROPIUM-ALBUTEROL 0.5-2.5 (3) MG/3ML IN SOLN
3.0000 mL | Freq: Once | RESPIRATORY_TRACT | Status: AC
  Administered 2022-05-28: 3 mL via RESPIRATORY_TRACT

## 2022-05-28 MED ORDER — DEXAMETHASONE SODIUM PHOSPHATE 10 MG/ML IJ SOLN
INTRAMUSCULAR | Status: DC | PRN
  Administered 2022-05-28: 5 mg via INTRAVENOUS

## 2022-05-28 MED ORDER — PROPOFOL 10 MG/ML IV BOLUS
INTRAVENOUS | Status: DC | PRN
  Administered 2022-05-28: 90 mg via INTRAVENOUS
  Administered 2022-05-28: 40 mg via INTRAVENOUS

## 2022-05-28 MED ORDER — CEFAZOLIN SODIUM-DEXTROSE 2-4 GM/100ML-% IV SOLN
2.0000 g | Freq: Once | INTRAVENOUS | Status: AC
  Administered 2022-05-28: 2 g via INTRAVENOUS

## 2022-05-28 MED ORDER — CEFAZOLIN SODIUM-DEXTROSE 2-4 GM/100ML-% IV SOLN
INTRAVENOUS | Status: AC
  Filled 2022-05-28: qty 100

## 2022-05-28 MED ORDER — ACETAMINOPHEN 10 MG/ML IV SOLN
1000.0000 mg | Freq: Once | INTRAVENOUS | Status: DC | PRN
  Administered 2022-05-28: 1000 mg via INTRAVENOUS

## 2022-05-28 MED ORDER — HEMOSTATIC AGENTS (NO CHARGE) OPTIME
TOPICAL | Status: DC | PRN
  Administered 2022-05-28: 1 via TOPICAL

## 2022-05-28 MED ORDER — ACETAMINOPHEN 10 MG/ML IV SOLN
INTRAVENOUS | Status: AC
  Filled 2022-05-28: qty 100

## 2022-05-28 MED ORDER — VISTASEAL 10 ML SINGLE DOSE KIT
PACK | CUTANEOUS | Status: AC
  Filled 2022-05-28: qty 10

## 2022-05-28 MED ORDER — ONDANSETRON HCL 4 MG/2ML IJ SOLN
INTRAMUSCULAR | Status: DC | PRN
  Administered 2022-05-28: 4 mg via INTRAVENOUS

## 2022-05-28 MED ORDER — LIDOCAINE HCL (CARDIAC) PF 100 MG/5ML IV SOSY
PREFILLED_SYRINGE | INTRAVENOUS | Status: DC | PRN
  Administered 2022-05-28: 100 mg via INTRAVENOUS

## 2022-05-28 MED ORDER — VISTASEAL 10 ML SINGLE DOSE KIT
PACK | CUTANEOUS | Status: DC | PRN
  Administered 2022-05-28: 10 mL via TOPICAL

## 2022-05-28 MED ORDER — ORAL CARE MOUTH RINSE: 15.0000 mL | OROMUCOSAL | Status: DC | PRN

## 2022-05-28 MED ORDER — BUPIVACAINE-EPINEPHRINE 0.25% -1:200000 IJ SOLN
INTRAMUSCULAR | Status: DC | PRN
  Administered 2022-05-28: 30 mL

## 2022-05-28 MED ORDER — IPRATROPIUM-ALBUTEROL 0.5-2.5 (3) MG/3ML IN SOLN
RESPIRATORY_TRACT | Status: AC
  Filled 2022-05-28: qty 3

## 2022-05-28 SURGICAL SUPPLY — 58 items
ADH SKN CLS APL DERMABOND .7 (GAUZE/BANDAGES/DRESSINGS) ×2
ADH SKN CLS LQ APL DERMABOND (GAUZE/BANDAGES/DRESSINGS) ×2
APL LAPSCP 35 DL APL RGD (MISCELLANEOUS) ×2
APPLICATOR VISTASEAL 35 (MISCELLANEOUS) IMPLANT
BAG PRESSURE INF REUSE 1000 (BAG) IMPLANT
BLADE SURG SZ11 CARB STEEL (BLADE) ×3 IMPLANT
CANNULA REDUC XI 12-8 STAPL (CANNULA) ×2
CANNULA REDUCER 12-8 DVNC XI (CANNULA) ×3 IMPLANT
CATH REDDICK CHOLANGI 4FR 50CM (CATHETERS) IMPLANT
CLIP LIGATING HEM O LOK PURPLE (MISCELLANEOUS) IMPLANT
CLIP LIGATING HEMO O LOK GREEN (MISCELLANEOUS) ×3 IMPLANT
DERMABOND ADVANCED .7 DNX12 (GAUZE/BANDAGES/DRESSINGS) ×3 IMPLANT
DERMABOND ADVANCED .7 DNX6 (GAUZE/BANDAGES/DRESSINGS) IMPLANT
DRAPE ARM DVNC X/XI (DISPOSABLE) ×12 IMPLANT
DRAPE C-ARM XRAY 36X54 (DRAPES) IMPLANT
DRAPE COLUMN DVNC XI (DISPOSABLE) ×3 IMPLANT
DRAPE DA VINCI XI ARM (DISPOSABLE) ×8
DRAPE DA VINCI XI COLUMN (DISPOSABLE) ×2
ELECT REM PT RETURN 9FT ADLT (ELECTROSURGICAL) ×2
ELECTRODE REM PT RTRN 9FT ADLT (ELECTROSURGICAL) ×3 IMPLANT
GLOVE BIO SURGEON STRL SZ 6.5 (GLOVE) ×6 IMPLANT
GLOVE BIOGEL PI IND STRL 6.5 (GLOVE) ×6 IMPLANT
GOWN STRL REUS W/ TWL LRG LVL3 (GOWN DISPOSABLE) ×9 IMPLANT
GOWN STRL REUS W/TWL LRG LVL3 (GOWN DISPOSABLE) ×6
GRASPER SUT TROCAR 14GX15 (MISCELLANEOUS) ×3 IMPLANT
IRRIGATOR SUCT 8 DISP DVNC XI (IRRIGATION / IRRIGATOR) IMPLANT
IRRIGATOR SUCTION 8MM XI DISP (IRRIGATION / IRRIGATOR)
IV CATH ANGIO 12GX3 LT BLUE (NEEDLE) IMPLANT
IV NS 1000ML (IV SOLUTION)
IV NS 1000ML BAXH (IV SOLUTION) IMPLANT
KIT PINK PAD W/HEAD ARE REST (MISCELLANEOUS) ×2
KIT PINK PAD W/HEAD ARM REST (MISCELLANEOUS) ×3 IMPLANT
LABEL OR SOLS (LABEL) ×3 IMPLANT
MANIFOLD NEPTUNE II (INSTRUMENTS) ×3 IMPLANT
NDL INSUFFLATION 14GA 120MM (NEEDLE) ×3 IMPLANT
NEEDLE HYPO 22GX1.5 SAFETY (NEEDLE) ×3 IMPLANT
NEEDLE INSUFFLATION 14GA 120MM (NEEDLE) ×2 IMPLANT
NS IRRIG 500ML POUR BTL (IV SOLUTION) ×3 IMPLANT
OBTURATOR OPTICAL STANDARD 8MM (TROCAR) ×2
OBTURATOR OPTICAL STND 8 DVNC (TROCAR) ×2
OBTURATOR OPTICALSTD 8 DVNC (TROCAR) ×3 IMPLANT
PACK LAP CHOLECYSTECTOMY (MISCELLANEOUS) ×3 IMPLANT
SEAL CANN UNIV 5-8 DVNC XI (MISCELLANEOUS) ×9 IMPLANT
SEAL XI 5MM-8MM UNIVERSAL (MISCELLANEOUS) ×6
SET TUBE SMOKE EVAC HIGH FLOW (TUBING) ×3 IMPLANT
SOLUTION ELECTROLUBE (MISCELLANEOUS) ×3 IMPLANT
SPIKE FLUID TRANSFER (MISCELLANEOUS) ×6 IMPLANT
SPONGE T-LAP 4X18 ~~LOC~~+RFID (SPONGE) IMPLANT
STAPLER CANNULA SEAL DVNC XI (STAPLE) ×3 IMPLANT
STAPLER CANNULA SEAL XI (STAPLE) ×2
SUT MNCRL 4-0 (SUTURE) ×2
SUT MNCRL 4-0 27XMFL (SUTURE) ×2
SUT VICRYL 0 AB UR-6 (SUTURE) ×3 IMPLANT
SUTURE MNCRL 4-0 27XMF (SUTURE) ×3 IMPLANT
SYS BAG RETRIEVAL 10MM (BASKET) ×2
SYSTEM BAG RETRIEVAL 10MM (BASKET) ×3 IMPLANT
TRAP FLUID SMOKE EVACUATOR (MISCELLANEOUS) ×3 IMPLANT
WATER STERILE IRR 500ML POUR (IV SOLUTION) ×3 IMPLANT

## 2022-05-28 NOTE — Progress Notes (Signed)
Progress Note   Patient: [name] JYN:[id number] DOB: [birth year] DOA: 05/26/2022     2 DOS: the patient was seen and examined on 05/28/2022   Brief hospital course: Taken from H&P.  HPI: [name] is a 76 y.o. male with medical history significant for HTN, prostate cancer s/p prostatectomy 2003, multiple myeloma in remission and more recently stage IV non-small cell lung cancer on palliative chemotherapy and immunotherapy, presents to the ED with a complaint of abdominal pain.  Patient recently saw his PCP for the same and outpatient CT scan was concerning for cholecystitis.  He spoke with surgeon, Dr. Hampton Abbot who recommended reevaluation in the ED.  Patient endorses nausea but no vomiting.  He denies fever or chills.   ED course and data review:Vitals initially within normal limits though BP did dip as low as 85/59, fluid responsive to 152/86.  Blood work with normal WBC.  Mild transaminitis with AST 93 and ALT 64.  Lipase 28. Right upper quadrant ultrasound showing the following: IMPRESSION: 1. Gallstones with gallbladder distension and edematous gallbladder wall thickening. Findings are suspicious for acute cholecystitis, however there is no sonographic Murphy sign. 2. No biliary dilatation.   Outpatient CT abdomen and pelvis done earlier in the day showed the following IMPRESSION: Gallbladder is distended. Stones are noted in the lumen of gallbladder, neck of gallbladder and distal common bile duct. There is pericholecystic stranding. Findings suggest acute cholecystitis. Follow-up gallbladder sonogram and surgical consultation should be considered.  General surgery and GI was consulted . Patient was started on Zosyn.  10/31: Patient was taken for ERCP by GI today.  Found to have choledocholithiasis, 1 stone was removed.  Tolerated the procedure well.  11/1: S/p ERCP with removal of 1 stone from bile duct.  General surgery is planning cholecystectomy tomorrow.  Patient  will remain on clear liquid diet and Zosyn today.  11/2: No acute concerns. Going for cholecystectomy later today.   Assessment and Plan: * Acute cholecystitis CT abdomen and pelvis earlier in the day showed possible CBD stone Ultrasound showed possible cholecystitis IV Zosyn, pain management and IV antiemetics and IV hydration S/p ERCP Going for cholecystectomy today.   Calculus of common duct S/p ERCP, sphincterotomy and removal of 1 stone. -Monitor for any postoperative pancreatitis bleeding -Start him on clear liquid  Lung cancer ([place]) On palliative chemotherapy  History of prostate cancer s/p prostatectomy/history of multiple myeloma in remission No acute issues  Essential hypertension, benign Blood pressure mildly elevated, not on any antihypertensives at home. Pain might be playing a role. -Continue to monitor   Subjective: Patient was seen and examined today. No new concerns, waiting for his surgery. No pain.  Physical Exam: Vitals:   05/27/22 1628 05/27/22 1939 05/28/22 0430 05/28/22 1115  BP: 121/77 135/79 132/81 (!) 153/81  Pulse: 68 60 60 63  Resp: _0 Temp: 98.2 F (36.8 C) 97.9 F (36.6 C) 98.2 F (36.8 C) (!) 97.5 F (36.4 C)  TempSrc: Oral Oral Oral Temporal  SpO2: 100% 99% 99% 98%  Weight:    71.2 kg  Height:    _1  (1.854 m)   General.  Frail elderly, in no acute distress. Pulmonary.  Lungs clear bilaterally, normal respiratory effort. CV.  Regular rate and rhythm, no JVD, rub or murmur. Abdomen.  Soft, nontender, nondistended, BS positive. CNS.  Alert and oriented .  No focal neurologic deficit. Extremities.  No edema, no cyanosis, pulses intact and symmetrical. Psychiatry.  Judgment and insight appears normal.   Data Reviewed: Prior data reviewed  Family Communication: Discussed with wife and a caregiver at bedside  Disposition: Status is: Inpatient Remains inpatient appropriate because: Severity of illness   Planned  Discharge Destination: Home with Home Health  Time spent: 79 Minutes  This record has been created using Systems analyst. Errors have been sought and corrected,but may not always be located. Such creation errors do not reflect on the standard of care.  Author: Lorella Nimrod, MD 05/28/2022 3:01 PM  For on call review [email or web address].

## 2022-05-28 NOTE — Anesthesia Preprocedure Evaluation (Addendum)
Anesthesia Evaluation  Patient identified by MRN, date of birth, ID band Patient awake    Reviewed: Allergy & Precautions, NPO status , Patient's Chart, lab work & pertinent test results  History of Anesthesia Complications Negative for: history of anesthetic complications  Airway Mallampati: I   Neck ROM: Full    Dental  (+) Upper Dentures, Partial Lower   Pulmonary former smoker (quit 1998) Stage IV NSCLC on palliative chemotherapy   Pulmonary exam normal breath sounds clear to auscultation       Cardiovascular hypertension, Normal cardiovascular exam Rhythm:Regular Rate:Normal  ECG 10/10/21:  Sinus  Rhythm  -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   Neuro/Psych  Neuromuscular disease (peripheral neuropathy)    GI/Hepatic ,GERD  ,,  Endo/Other  negative endocrine ROS    Renal/GU Renal disease (nephrolithiasis)   Prostate CA    Musculoskeletal   Abdominal   Peds  Hematology  (+) Blood dyscrasia, anemia Multiple myeloma   Anesthesia Other Findings   Reproductive/Obstetrics                             Anesthesia Physical Anesthesia Plan  ASA: 3  Anesthesia Plan: General   Post-op Pain Management:    Induction: Intravenous  PONV Risk Score and Plan: 2 and Ondansetron, Dexamethasone and Treatment may vary due to age or medical condition  Airway Management Planned: Oral ETT  Additional Equipment:   Intra-op Plan:   Post-operative Plan: Extubation in OR  Informed Consent: I have reviewed the patients History and Physical, chart, labs and discussed the procedure including the risks, benefits and alternatives for the proposed anesthesia with the patient or authorized representative who has indicated his/her understanding and acceptance.     Dental advisory given  Plan Discussed with: CRNA  Anesthesia Plan Comments: (Patient consented for risks of  anesthesia including but not limited to:  - adverse reactions to medications - damage to eyes, teeth, lips or other oral mucosa - nerve damage due to positioning  - sore throat or hoarseness - damage to heart, brain, nerves, lungs, other parts of body or loss of life  Informed patient about role of CRNA in peri- and intra-operative care.  Patient voiced understanding.)       Anesthesia Quick Evaluation

## 2022-05-28 NOTE — Op Note (Signed)
Preoperative diagnosis: Acute cholecystitis   Postoperative diagnosis: Same  Procedure: Robotic Assisted Laparoscopic Cholecystectomy.                      Robotic assisted laparoscopic omental biopsy  Anesthesia: GETA   Surgeon: Dr. Windell Moment  Wound Classification: Clean Contaminated  Indications: Patient is a 76 y.o. male developed right upper quadrant pain and on workup was found to have cholecystitis with choledocholithiasis. He had ERCP with resolution of choledocholithiasis Robotic Assisted Laparoscopic cholecystectomy was elected.  Findings: Severe gallbladder wall thickening with purulent bile Critical view of safety achieved Cystic duct and artery identified, ligated and divided Adequate hemostasis       Description of procedure: The patient was placed on the operating table in the supine position. General anesthesia was induced. A time-out was completed verifying correct patient, procedure, site, positioning, and implant(s) and/or special equipment prior to beginning this procedure. An orogastric tube was placed. The abdomen was prepped and draped in the usual sterile fashion.  An incision was made in a natural skin line below the umbilicus.  The fascia was elevated and the Veress needle inserted. Proper position was confirmed by aspiration and saline meniscus test.  The abdomen was insufflated with carbon dioxide to a pressure of 15 mmHg. The patient tolerated insufflation well. A 8-mm trocar was then inserted in optiview fashion.  The laparoscope was inserted and the abdomen inspected. No injuries from initial trocar placement were noted. Additional trocars were then inserted in the following locations: an 8-mm trocar in the left lateral abdomen, and another two 8-mm trocars to the right side of the abdomen 5 cm appart. The umbilical trocar was changed to a 12 mm trocar all under direct visualization. The abdomen was inspected and no abnormalities were found. The table  was placed in the reverse Trendelenburg position with the right side up. The robotic arms were docked and target anatomy identified. Instrument inserted under direct visualization.  Hard adhesions between the gallbladder and omentum, duodenum and transverse colon were lysed with electrocautery. Very dilated gallbladder was identified. The dome of the gallbladder was grasped with a prograsp and retracted over the dome of the liver. The infundibulum was also grasped with an atraumatic grasper and retracted toward the right lower quadrant. This maneuver exposed Calot's triangle. A very difficult and time consuming dissection of the peritoneum overlying the gallbladder infundibulum was needed to be done to be able to expose the cystic duct and cystic artery circumferentially. Critical view of safety was finally achieved and reviewed before ligating any structure. Firefly images taken to visualize biliary ducts. The cystic duct and cystic artery were then doubly clipped and divided close to the gallbladder.  The gallbladder was then dissected from its peritoneal attachments by electrocautery which was also very difficult and time consuming needed to drain the gallbladder purulent bile. Hemostasis was checked and the gallbladder and contained stones were removed using an endoscopic retrieval bag. The gallbladder was passed off the table as a specimen. The gallbladder fossa was copiously irrigated with saline and hemostasis was obtained. Vistal seal was irrigated on liver bed. There was no evidence of bleeding from the gallbladder fossa or cystic artery or leakage of the bile from the cystic duct stump.  There was an area of omental fat with multiple white patches suspected of malignancy. I decided to excised a portion of the omental fat for diagnostic purposes. This was done with bipolar energy and cautery.  Secondary trocars were  removed under direct vision. No bleeding was noted. The robotic arms were undoked. The  scope was withdrawn and the umbilical trocar removed. The abdomen was allowed to collapse. The fascia of the 34mm trocar sites was closed with figure-of-eight 0 vicryl sutures. The skin was closed with subcuticular sutures of 4-0 monocryl and topical skin adhesive. The orogastric tube was removed.  The patient tolerated the procedure well and was taken to the postanesthesia care unit in stable condition.   Specimen: Gallbladder                    Omental biopsy  Complications: None  EBL: 100 mL

## 2022-05-28 NOTE — Interval H&P Note (Signed)
History and Physical Interval Note:  05/28/2022 12:10 PM  [name]  has presented today for surgery, with the diagnosis of Cholecycitis.  The various methods of treatment have been discussed with the patient and family. After consideration of risks, benefits and other options for treatment, the patient has consented to  Procedure(s): XI ROBOTIC ASSISTED LAPAROSCOPIC CHOLECYSTECTOMY (N/A) [place] ([facility]) (N/A) as a surgical intervention.  The patient's history has been reviewed, patient examined, no change in status, stable for surgery.  I have reviewed the patient's chart and labs.  Questions were answered to the patient's satisfaction.     Herbert Pun

## 2022-05-28 NOTE — Anesthesia Procedure Notes (Signed)
Procedure Name: Intubation Date/Time: 05/28/2022 12:22 PM  Performed by: Cammie Sickle, CRNAPre-anesthesia Checklist: Patient identified, Patient being monitored, Timeout performed, Emergency Drugs available and Suction available Patient Re-evaluated:Patient Re-evaluated prior to induction Oxygen Delivery Method: Circle system utilized Preoxygenation: Pre-oxygenation with 100% oxygen Induction Type: IV induction Ventilation: Mask ventilation without difficulty Laryngoscope Size: 3 and McGraph Grade View: Grade I Tube type: Oral Tube size: 7.0 mm Number of attempts: 1 Airway Equipment and Method: Stylet Placement Confirmation: ETT inserted through vocal cords under direct vision, positive ETCO2 and breath sounds checked- equal and bilateral Secured at: 22 cm Tube secured with: Tape Dental Injury: Teeth and Oropharynx as per pre-operative assessment

## 2022-05-28 NOTE — TOC CM/SW Note (Signed)
  Transition of Care [facility]) Screening Note   Patient Details  Name: [name] Date of Birth: 1946/06/25   [facility] of [facility]) CM/SW Contact:    Candie Chroman, LCSW Phone Number: 05/28/2022, 9:57 AM    Transition of Care Department [facility]) has reviewed patient and no TOC needs have been identified at this time. We will continue to monitor patient advancement through interdisciplinary progression rounds. If new patient transition needs arise, please place a TOC consult.

## 2022-05-28 NOTE — Transfer of Care (Signed)
Immediate Anesthesia Transfer of Care Note  Patient: [name]  Procedure(s) Performed: XI ROBOTIC ASSISTED LAPAROSCOPIC CHOLECYSTECTOMY (Abdomen) INDOCYANINE GREEN FLUORESCENCE IMAGING ([facility])  Patient Location: PACU  Anesthesia Type:General  Level of Consciousness: drowsy  Airway & Oxygen Therapy: Patient Spontanous Breathing and Patient connected to face mask oxygen  Post-op Assessment: Report given to RN and Post -op Vital signs reviewed and stable  Post vital signs: Reviewed and stable  Last Vitals:  Vitals Value Taken Time  BP 125/74 05/28/22 1523  Temp 36.2 C 05/28/22 1523  Pulse 75 05/28/22 1527  Resp 19 05/28/22 1527  SpO2 100 % 05/28/22 1527  Vitals shown include unvalidated device data.  Last Pain:  Vitals:   05/28/22 1115  TempSrc: Temporal  PainSc: 0-No pain      Patients Stated Pain Goal: 2 (02/54/86 2824)  Complications: No notable events documented.

## 2022-05-28 NOTE — Progress Notes (Signed)
Upon arrival from OR noted 3 cm superficial skin tear left outer forearm.  Small mount of bleeding. Area cleansed with saline. Tegaderm applied with paper tape to secure.

## 2022-05-29 DIAGNOSIS — K810 Acute cholecystitis: Secondary | ICD-10-CM | POA: Diagnosis not present

## 2022-05-29 LAB — URINALYSIS, ROUTINE W REFLEX MICROSCOPIC
Bacteria, UA: NONE SEEN
Bilirubin Urine: NEGATIVE
Glucose, UA: NEGATIVE mg/dL
Hgb urine dipstick: NEGATIVE
Ketones, ur: 20 mg/dL — AB
Leukocytes,Ua: NEGATIVE
Nitrite: NEGATIVE
Protein, ur: NEGATIVE mg/dL
Specific Gravity, Urine: 1.018 (ref 1.005–1.030)
Squamous Epithelial / HPF: NONE SEEN (ref 0–5)
pH: 5 (ref 5.0–8.0)

## 2022-05-29 LAB — COMPREHENSIVE METABOLIC PANEL WITH GFR
ALT: 61 U/L — ABNORMAL HIGH (ref 0–44)
AST: 98 U/L — ABNORMAL HIGH (ref 15–41)
Albumin: 2.4 g/dL — ABNORMAL LOW (ref 3.5–5.0)
Alkaline Phosphatase: 99 U/L (ref 38–126)
Anion gap: 10 (ref 5–15)
BUN: 12 mg/dL (ref 8–23)
CO2: 25 mmol/L (ref 22–32)
Calcium: 7.9 mg/dL — ABNORMAL LOW (ref 8.9–10.3)
Chloride: 101 mmol/L (ref 98–111)
Creatinine, Ser: 1.17 mg/dL (ref 0.61–1.24)
GFR, Estimated: >60 mL/min (ref >60)
Glucose, Bld: 120 mg/dL — ABNORMAL HIGH (ref 70–99)
Potassium: 4.3 mmol/L (ref 3.5–5.1)
Sodium: 136 mmol/L (ref 135–145)
Total Bilirubin: 0.9 mg/dL (ref 0.3–1.2)
Total Protein: 6.4 g/dL — ABNORMAL LOW (ref 6.5–8.1)

## 2022-05-29 LAB — CBC
HCT: 32.3 % — ABNORMAL LOW (ref 39.0–52.0)
Hemoglobin: 11.2 g/dL — ABNORMAL LOW (ref 13.0–17.0)
MCH: 34.1 pg — ABNORMAL HIGH (ref 26.0–34.0)
MCHC: 34.7 g/dL (ref 30.0–36.0)
MCV: 98.5 fL (ref 80.0–100.0)
Platelets: 202 K/uL (ref 150–400)
RBC: 3.28 MIL/uL — ABNORMAL LOW (ref 4.22–5.81)
RDW: 12.1 % (ref 11.5–15.5)
WBC: 11.6 K/uL — ABNORMAL HIGH (ref 4.0–10.5)
nRBC: 0 % (ref 0.0–0.2)

## 2022-05-29 MED ORDER — ACETAMINOPHEN 325 MG PO TABS: 650.0000 mg | ORAL_TABLET | Freq: Four times a day (QID) | ORAL | Status: DC | PRN

## 2022-05-29 MED ORDER — PHENOL 1.4 % MT LIQD
1.0000 | OROMUCOSAL | Status: DC | PRN
  Administered 2022-05-29: 1 via OROMUCOSAL
  Filled 2022-05-29: qty 177

## 2022-05-29 NOTE — Plan of Care (Signed)
  Problem: Education: Goal: Knowledge of General Education information will improve Description: Including pain rating scale, medication(s)/side effects and non-pharmacologic comfort measures 05/29/2022 1436 by Earley Brooke, RN Outcome: Adequate for Discharge 05/29/2022 1435 by Earley Brooke, RN Outcome: Adequate for Discharge   Problem: Health Behavior/Discharge Planning: Goal: Ability to manage health-related needs will improve 05/29/2022 1436 by Earley Brooke, RN Outcome: Adequate for Discharge 05/29/2022 1435 by Earley Brooke, RN Outcome: Adequate for Discharge   Problem: Clinical Measurements: Goal: Ability to maintain clinical measurements within normal limits will improve 05/29/2022 1436 by Earley Brooke, RN Outcome: Adequate for Discharge 05/29/2022 1435 by Earley Brooke, RN Outcome: Adequate for Discharge Goal: Will remain free from infection 05/29/2022 1436 by Earley Brooke, RN Outcome: Adequate for Discharge 05/29/2022 1435 by Earley Brooke, RN Outcome: Adequate for Discharge Goal: Diagnostic test results will improve 05/29/2022 1436 by Earley Brooke, RN Outcome: Adequate for Discharge 05/29/2022 1435 by Earley Brooke, RN Outcome: Adequate for Discharge Goal: Respiratory complications will improve 05/29/2022 1436 by Earley Brooke, RN Outcome: Adequate for Discharge 05/29/2022 1435 by Earley Brooke, RN Outcome: Adequate for Discharge Goal: Cardiovascular complication will be avoided 05/29/2022 1436 by Earley Brooke, RN Outcome: Adequate for Discharge 05/29/2022 1435 by Earley Brooke, RN Outcome: Adequate for Discharge   Problem: Clinical Measurements: Goal: Will remain free from infection 05/29/2022 1436 by Earley Brooke, RN Outcome: Adequate for Discharge 05/29/2022 1435 by Earley Brooke, RN Outcome: Adequate for Discharge   Problem: Clinical Measurements: Goal: Diagnostic test results will improve 05/29/2022 1436 by Earley Brooke, RN Outcome: Adequate for Discharge 05/29/2022 1435 by Earley Brooke,  RN Outcome: Adequate for Discharge   Problem: Activity: Goal: Risk for activity intolerance will decrease 05/29/2022 1436 by Earley Brooke, RN Outcome: Adequate for Discharge 05/29/2022 1435 by Earley Brooke, RN Outcome: Adequate for Discharge   Problem: Clinical Measurements: Goal: Respiratory complications will improve 05/29/2022 1436 by Earley Brooke, RN Outcome: Adequate for Discharge 05/29/2022 1435 by Earley Brooke, RN Outcome: Adequate for Discharge   Problem: Clinical Measurements: Goal: Cardiovascular complication will be avoided 05/29/2022 1436 by Earley Brooke, RN Outcome: Adequate for Discharge 05/29/2022 1435 by Earley Brooke, RN Outcome: Adequate for Discharge   Problem: Nutrition: Goal: Adequate nutrition will be maintained 05/29/2022 1436 by Earley Brooke, RN Outcome: Adequate for Discharge 05/29/2022 1435 by Earley Brooke, RN Outcome: Adequate for Discharge   Problem: Coping: Goal: Level of anxiety will decrease 05/29/2022 1436 by Earley Brooke, RN Outcome: Adequate for Discharge 05/29/2022 1435 by Earley Brooke, RN Outcome: Adequate for Discharge   Problem: Pain Managment: Goal: General experience of comfort will improve 05/29/2022 1436 by Earley Brooke, RN Outcome: Adequate for Discharge 05/29/2022 1435 by Earley Brooke, RN Outcome: Adequate for Discharge   Problem: Safety: Goal: Ability to remain free from injury will improve 05/29/2022 1436 by Earley Brooke, RN Outcome: Adequate for Discharge 05/29/2022 1435 by Earley Brooke, RN Outcome: Adequate for Discharge   Problem: Skin Integrity: Goal: Risk for impaired skin integrity will decrease 05/29/2022 1436 by Earley Brooke, RN Outcome: Adequate for Discharge 05/29/2022 1435 by Earley Brooke, RN Outcome: Adequate for Discharge

## 2022-05-29 NOTE — TOC Transition Note (Signed)
Transition of Care [facility]) - CM/SW Discharge Note   Patient Details  Name: [name] MRN: [id number] Date of Birth: 1945/11/29  [facility]) CM/SW Contact:  Candie Chroman, LCSW Phone Number: 05/29/2022, 2:36 PM   Clinical Narrative:   Patient has orders to discharge home today. Met with patient and family at bedside to discuss home health recommendations. They declined. Encouraged them to contact his PCP if they change their mind after discharge. No DME recommendations. No further concerns. CS signing off.  Final next level of care: Home/Self Care Barriers to Discharge: No Barriers Identified   Patient Goals and CMS Choice        Discharge Placement                Patient to be transferred to facility by: Family   Patient and family notified of of transfer: 05/29/22  Discharge Plan and Services                                     Social Determinants of Health (SDOH) Interventions     Readmission Risk Interventions     No data to display

## 2022-05-29 NOTE — Discharge Summary (Signed)
Physician Discharge Summary   Patient: [name] MRN: [id number] DOB: [birth year]  Admit date:     05/26/2022  Discharge date: 05/29/22  Discharge Physician: Lorella Nimrod   PCP: Jinny Sanders, MD   Recommendations at discharge:  Please obtain CBC and CMP in 1 week Follow-up with general surgery according to your appointment Follow with your primary care provider Follow-up with your oncologist  Discharge Diagnoses: Principal Problem:   Acute cholecystitis Active Problems:   Calculus of common duct   Lung cancer ([place])   History of prostate cancer s/p prostatectomy/history of multiple myeloma in remission   Essential hypertension, benign   Hospital Course: Taken from H&P.  HPI: [name] is a 76 y.o. male with medical history significant for HTN, prostate cancer s/p prostatectomy 2003, multiple myeloma in remission and more recently stage IV non-small cell lung cancer on palliative chemotherapy and immunotherapy, presents to the ED with a complaint of abdominal pain.  Patient recently saw his PCP for the same and outpatient CT scan was concerning for cholecystitis.  He spoke with surgeon, Dr. Hampton Abbot who recommended reevaluation in the ED.  Patient endorses nausea but no vomiting.  He denies fever or chills.   ED course and data review:Vitals initially within normal limits though BP did dip as low as 85/59, fluid responsive to 152/86.  Blood work with normal WBC.  Mild transaminitis with AST 93 and ALT 64.  Lipase 28. Right upper quadrant ultrasound showing the following: IMPRESSION: 1. Gallstones with gallbladder distension and edematous gallbladder wall thickening. Findings are suspicious for acute cholecystitis, however there is no sonographic Murphy sign. 2. No biliary dilatation.   Outpatient CT abdomen and pelvis done earlier in the day showed the following IMPRESSION: Gallbladder is distended. Stones are noted in the lumen of gallbladder, neck of gallbladder  and distal common bile duct. There is pericholecystic stranding. Findings suggest acute cholecystitis. Follow-up gallbladder sonogram and surgical consultation should be considered.  General surgery and GI was consulted . Patient was started on Zosyn.  10/31: Patient was taken for ERCP by GI today.  Found to have choledocholithiasis, 1 stone was removed.  Tolerated the procedure well.  11/1: S/p ERCP with removal of 1 stone from bile duct.  General surgery is planning cholecystectomy tomorrow.  Patient will remain on clear liquid diet and Zosyn today.  11/2: No acute concerns. Going for cholecystectomy later today.  11/3: Patient underwent successful laparoscopic cholecystectomy with general surgery yesterday.  Tolerated the procedure well.  Labs seems stable.  Patient was able to start on diet and tolerating it well.  Being evaluated by PT and they were recommending home health which was ordered.  Patient is being discharged on his current medications and need to have a close follow-up with general surgery and his providers.  Assessment and Plan: * Acute cholecystitis CT abdomen and pelvis earlier in the day showed possible CBD stone Ultrasound showed possible cholecystitis IV Zosyn, pain management and IV antiemetics and IV hydration S/p ERCP Going for cholecystectomy today.   Calculus of common duct S/p ERCP, sphincterotomy and removal of 1 stone. -Monitor for any postoperative pancreatitis bleeding -Start him on clear liquid  Lung cancer ([place]) On palliative chemotherapy  History of prostate cancer s/p prostatectomy/history of multiple myeloma in remission No acute issues  Essential hypertension, benign Blood pressure mildly elevated, not on any antihypertensives at home. Pain might be playing a role. -Continue to monitor   Consultants: General surgery, gastroenterology Procedures performed: ERCP,  laparoscopic cholecystectomy Disposition: Home health Diet  recommendation:  Discharge Diet Orders (From admission, onward)     Start     Ordered   05/29/22 0000  Diet - low sodium heart healthy        05/29/22 1427           Cardiac diet DISCHARGE MEDICATION: Allergies as of 05/29/2022       Reactions   Celebrex [celecoxib] Nausea Only   Cymbalta [duloxetine Hcl] Other (See Comments), Hives   Weight loss Weight loss   Hydromorphone Hcl Itching   Meloxicam Itching   Oxycontin [oxycodone] Itching   Also caused constipation, insomnia Also caused constipation, insomnia Also caused constipation, insomnia   Percocet [oxycodone-acetaminophen] Itching   Vicodin [hydrocodone-acetaminophen] Itching   Lyrica [pregabalin] Rash   Lesions        Medication List     STOP taking these medications    aspirin 81 MG tablet   Calcium Carbonate-Vitamin D 600-400 MG-UNIT tablet   cholecalciferol 1000 units tablet Commonly known as: VITAMIN D   ciprofloxacin 500 MG tablet Commonly known as: Cipro   mirtazapine 15 MG tablet Commonly known as: REMERON       TAKE these medications    acetaminophen 325 MG tablet Commonly known as: TYLENOL Take 2 tablets (650 mg total) by mouth every 6 (six) hours as needed for mild pain (or Fever >/= 101).   fentaNYL 75 MCG/HR Commonly known as: [place] 1 patch onto the skin every other day.   folic acid 1 MG tablet Commonly known as: FOLVITE Take 1 tablet (1 mg total) by mouth daily.   lidocaine-prilocaine cream Commonly known as: EMLA Apply topically as needed. Apply to port site one hour before treatment and cover with plastic wrap   NONFORMULARY OR COMPOUNDED ITEM Baclofn/amitrp/ketamn cream topically to feet TID prn for neuropathy   ondansetron 4 MG tablet Commonly known as: ZOFRAN Take 4 mg by mouth every 8 (eight) hours as needed.   Oxycodone HCl 10 MG Tabs Take 1-2 tablets by mouth 3 (three) times daily as needed.   pantoprazole 40 MG tablet Commonly known as:  PROTONIX Take 1 tablet (40 mg total) by mouth daily.   potassium chloride SA 20 MEQ tablet Commonly known as: KLOR-CON M 1 pill twice a day   prochlorperazine 10 MG tablet Commonly known as: COMPAZINE Take 1 tablet (10 mg total) by mouth every 6 (six) hours as needed for nausea or vomiting.   VITAMIN B 12 PO Take 1,000 mg by mouth daily.   Vitamin-B Complex Tabs Take 1 tablet by mouth daily.        Follow-up Information     Herbert Pun, MD Follow up in 2 week(s).   Specialty: General Surgery Why: Follow up after cholecystectomy Contact information: [place] [address] [phone]         Jinny Sanders, MD. Schedule an appointment as soon as possible for a visit in 1 week(s).   Specialty: Family Medicine Contact information: [place] [address] [phone]                Discharge Exam: Danley Danker Weights   05/25/22 1440 05/26/22 1554 05/28/22 1115  Weight: 79.4 kg 71.2 kg 71.2 kg   General.  Frail elderly man, in no acute distress. Pulmonary.  Lungs clear bilaterally, normal respiratory effort. CV.  Regular rate and rhythm, no JVD, rub or murmur. Abdomen.  Soft, nontender, nondistended, BS  positive. CNS.  Alert and oriented .  No focal neurologic deficit. Extremities.  No edema, no cyanosis, pulses intact and symmetrical. Psychiatry.  Judgment and insight appears normal.   Condition at discharge: stable  The results of significant diagnostics from this hospitalization (including imaging, microbiology, ancillary and laboratory) are listed below for reference.   Imaging Studies: DG C-Arm 1-60 Min-No Report  Result Date: 05/26/2022 Fluoroscopy was utilized by the requesting physician.  No radiographic interpretation.   US Abdomen Limited RUQ (LIVER/GB)  Result Date: 05/25/2022 CLINICAL DATA:  Right upper quadrant pain. EXAM: ULTRASOUND ABDOMEN LIMITED RIGHT UPPER QUADRANT COMPARISON:   Abdominopelvic CT earlier today FINDINGS: Gallbladder: Distended containing sludge and gallstones. Edematous gallbladder wall thickening measuring from 6-10 mm. No definite pericholecystic fluid. No sonographic Murphy sign noted by sonographer. Common bile duct: Diameter: 6 mm.  No visualized choledocholithiasis. Liver: Technically limited evaluation due to high positioning of the liver. Heterogeneous parenchyma but no evidence of focal lesion. No capsular nodularity. Portal vein is patent on color Doppler imaging with normal direction of blood flow towards the liver. Other: No right upper quadrant ascites. IMPRESSION: 1. Gallstones with gallbladder distension and edematous gallbladder wall thickening. Findings are suspicious for acute cholecystitis, however there is no sonographic Murphy sign. 2. No biliary dilatation. Electronically Signed   By: Keith Rake M.D.   On: 05/25/2022 16:23   CT Abdomen Pelvis Wo Contrast  Result Date: 05/25/2022 CLINICAL DATA:  Abdominal pain EXAM: CT ABDOMEN AND PELVIS WITHOUT CONTRAST TECHNIQUE: Multidetector CT imaging of the abdomen and pelvis was performed following the standard protocol without IV contrast. RADIATION DOSE REDUCTION: This exam was performed according to the departmental dose-optimization program which includes automated exposure control, adjustment of the mA and/or kV according to patient size and/or use of iterative reconstruction technique. COMPARISON:  03/18/2022 FINDINGS: Lower chest: Loculated effusion seen in the posterior left lower lung field has not changed. There are linear densities in lingula with no significant change. There is increase in interstitial markings in the right lower lung fields suggesting scarring from chronic interstitial lung disease. Left hemidiaphragm is elevated. Small pericardial effusion is present. Coronary artery calcifications are seen. Hepatobiliary: There is no dilation of bile ducts. Gallbladder is distended. There  is wall thickening in gallbladder. There is minimal amount of fluid around the gallbladder. Gallbladder stones are seen. There is 4 mm calcific density in the region of neck of the gallbladder. There are calcific densities in the distal course of common bile duct close to the ampulla. Pancreas: Pancreas appears smaller than usual in size. No focal abnormalities are seen. Spleen: Unremarkable. Adrenals/Urinary Tract: Adrenals are unremarkable.There is no hydronephrosis. Bilateral renal stones are seen. Largest of the stones is in the upper pole of left kidney measuring 11 mm. There is 2.7 cm cyst in the anterior midportion of left kidney. Ureters are not dilated. Urinary bladder is not distended. Stomach/Bowel: Stomach is unremarkable. Small bowel loops are not dilated. Appendix is not dilated. There is no significant wall thickening in colon. Few diverticula are seen in colon. Vascular/Lymphatic: There are scattered calcifications in aorta and its major branches. Reproductive: Prostate is not seen suggesting previous removal. Other: There is no ascites or pneumoperitoneum. Small umbilical hernia containing fat is seen. Small bilateral inguinal hernias containing fat are noted. Musculoskeletal: Compression fractures and vertebroplasty are noted in multiple lower thoracic and lumbar vertebral bodies. There is deformity in the right pubic bone which has not changed. IMPRESSION: Gallbladder is distended. Stones are noted in  the lumen of gallbladder, neck of gallbladder and distal common bile duct. There is pericholecystic stranding. Findings suggest acute cholecystitis. Follow-up gallbladder sonogram and surgical consultation should be considered. There is no evidence of intestinal obstruction or pneumoperitoneum. There is no hydronephrosis. Bilateral renal stones.  Left renal cyst. Loculated effusion is seen in the posterior left lower lung fields. Coronary artery calcifications are seen. Scarring is seen in the lower  lung fields. Other findings as described in the body of the report. These results will be called to the ordering clinician or representative by the Radiologist Assistant, and communication documented in the PACS or [employer]. Electronically Signed   By: Elmer Picker M.D.   On: 05/25/2022 09:19   DG Chest 2 View  Result Date: 05/22/2022 CLINICAL DATA:  Cough, history of lung cancer. EXAM: CHEST - 2 VIEW COMPARISON:  February 25, 2022. FINDINGS: The heart size and mediastinal contours are within normal limits. Left-sided pacemaker is unchanged in position. Right lung is clear. Stable left pleural effusion is noted with probable associated pleural thickening and left basilar atelectasis or scarring. The visualized skeletal structures are unremarkable. IMPRESSION: Stable left pleural effusion and pleural thickening is noted with associated left basilar atelectasis or scarring. Electronically Signed   By: Marijo Conception M.D.   On: 05/22/2022 10:23    Microbiology: Results for orders placed or performed in visit on 05/20/22  Urine Culture     Status: None   Collection Time: 05/21/22  9:46 AM   Specimen: Urine, Random  Result Value Ref Range Status   Specimen Description   Final    URINE, RANDOM Performed at [facility], [address], [place], [place] [address]    Special Requests   Final    NONE Performed at [facility], [address], [place], [place] [address]    Culture   Final    NO GROWTH Performed at [facility], [place] [address] [place], [place] [address]    Report Status 05/22/2022 FINAL  Final   *Note: Due to a large number of results and/or encounters for the requested time period, some results have not been displayed. A complete set of results can be found in Results Review.    Labs: CBC: Recent Labs  Lab 05/25/22 1442 05/26/22 0103 05/27/22 0936 05/29/22 0520  WBC 5.5 6.1 4.7 11.6*  NEUTROABS  --  3.2  --   --   HGB 12.3* 11.1* 10.8*  11.2*  HCT 35.8* 32.6* 31.3* 32.3*  MCV 101.1* 100.0 100.0 98.5  PLT 183 183 174 361   Basic Metabolic Panel: Recent Labs  Lab 05/25/22 1442 05/26/22 0103 05/27/22 0936 05/29/22 0520  NA 137 136 137 136  K 3.7 3.3* 3.7 4.3  CL 106 105 107 101  CO2 _0 GLUCOSE 126* 98 113* 120*  BUN _1 CREATININE 1.03 0.92 1.16 1.17  CALCIUM 9.1 8.6* 8.6* 7.9*   Liver Function Tests: Recent Labs  Lab 05/25/22 1442 05/26/22 0103 05/27/22 0936 05/29/22 0520  AST 109* 93* 137* 98*  ALT 66* 64* 86* 61*  ALKPHOS 118 106 122 99  BILITOT 0.6 0.7 1.1 0.9  PROT 7.5 7.4 6.1* 6.4*  ALBUMIN 3.0* 2.9* 2.4* 2.4*   CBG: No results for input(s): "GLUCAP" in the last 168 hours.  Discharge time spent: greater than 30 minutes.  This record has been created using Systems analyst. Errors have been sought and corrected,but  may not always be located. Such creation errors do not reflect on the standard of care.   Signed: Lorella Nimrod, MD [facility] 05/29/2022

## 2022-05-29 NOTE — Progress Notes (Signed)
Patient discharge instructions reviewed with patient and spouse, both verbalized understanding. No acute distress noted or verbalized. Patient discharged home via personal vehicle.

## 2022-05-29 NOTE — Plan of Care (Signed)
  Problem: Education: Goal: Knowledge of General Education information will improve Description: Including pain rating scale, medication(s)/side effects and non-pharmacologic comfort measures Outcome: Adequate for Discharge   Problem: Health Behavior/Discharge Planning: Goal: Ability to manage health-related needs will improve Outcome: Adequate for Discharge   Problem: Clinical Measurements: Goal: Ability to maintain clinical measurements within normal limits will improve Outcome: Adequate for Discharge Goal: Will remain free from infection Outcome: Adequate for Discharge Goal: Diagnostic test results will improve Outcome: Adequate for Discharge Goal: Respiratory complications will improve Outcome: Adequate for Discharge Goal: Cardiovascular complication will be avoided Outcome: Adequate for Discharge   Problem: Nutrition: Goal: Adequate nutrition will be maintained Outcome: Adequate for Discharge   Problem: Coping: Goal: Level of anxiety will decrease Outcome: Adequate for Discharge   Problem: Elimination: Goal: Will not experience complications related to bowel motility Outcome: Adequate for Discharge Goal: Will not experience complications related to urinary retention Outcome: Adequate for Discharge   Problem: Pain Managment: Goal: General experience of comfort will improve Outcome: Adequate for Discharge

## 2022-05-29 NOTE — Progress Notes (Signed)
Patient ID: [name], male   DOB: 1945-09-06, 76 y.o.   MRN: [id number]     [facility] Hospital Day(s): 3.   Interval History: Patient seen and examined, no acute events or new complaints overnight. Patient reports feeling well.  He does have expected postoperative pain.  He is currently controlled with current pain medication.  He denies any nausea or vomiting.  Tolerated full liquids.  Vital signs in last 24 hours: [min-max] current  Temp:  [97 F (36.1 C)-98.6 F (37 C)] 97.8 F (36.6 C) (11/03 0428) Pulse Rate:  [63-82] 79 (11/03 0428) Resp:  [12-26] 20 (11/03 0428) BP: (114-153)/(67-86) 149/86 (11/03 0428) SpO2:  [97 %-100 %] 98 % (11/03 0428) Weight:  [71.2 kg] 71.2 kg (11/02 1115)     Height: 6\' 1"  (185.4 cm) Weight: 71.2 kg BMI (Calculated): 20.72   Physical Exam:  Constitutional: alert, cooperative and no distress  Respiratory: breathing non-labored at rest  Cardiovascular: regular rate and sinus rhythm  Gastrointestinal: soft, non-tender, and non-distended.  Incisions are dry and clean.  Labs:     Latest Ref Rng & Units 05/29/2022    5:20 AM 05/27/2022    9:36 AM 05/26/2022    1:03 AM  CBC  WBC 4.0 - 10.5 K/uL 11.6  4.7  6.1   Hemoglobin 13.0 - 17.0 g/dL 11.2  10.8  11.1   Hematocrit 39.0 - 52.0 % 32.3  31.3  32.6   Platelets 150 - 400 K/uL 202  174  183       Latest Ref Rng & Units 05/29/2022    5:20 AM 05/27/2022    9:36 AM 05/26/2022    1:03 AM  CMP  Glucose 70 - 99 mg/dL 120  113  98   BUN 8 - 23 mg/dL 12  14  17    Creatinine 0.61 - 1.24 mg/dL 1.17  1.16  0.92   Sodium 135 - 145 mmol/L 136  137  136   Potassium 3.5 - 5.1 mmol/L 4.3  3.7  3.3   Chloride 98 - 111 mmol/L 101  107  105   CO2 22 - 32 mmol/L 25  25  24    Calcium 8.9 - 10.3 mg/dL 7.9  8.6  8.6   Total Protein 6.5 - 8.1 g/dL 6.4  6.1  7.4   Total Bilirubin 0.3 - 1.2 mg/dL 0.9  1.1  0.7   Alkaline Phos 38 - 126 U/L 99  122  106   AST 15 - 41 U/L 98  137  93   ALT 0 - 44 U/L 61   86  64     Imaging studies: No new pertinent imaging studies   Assessment/Plan:  76 y.o. male with acute cholecystitis with choledocholithiasis 1 Day Post-Op s/p robotic laparoscopic cholecystectomy, complicated by pertinent comorbidities including stage IV lung cancer.  -Patient on postoperative day #1 recovering adequately -The pain is controlled -He tolerated full liquids.  Advance diet to soft diet. -Labs shows decreasing trend of liver enzymes, normal bilirubin. -There is adequate hemoglobin -If patient tolerates of diet and pain continues to be controlled patient can be discharged today from surgical standpoint when medically stable.  Arnold Long, MD

## 2022-05-29 NOTE — Discharge Instructions (Signed)
  Diet: Resume home heart healthy regular diet.   Activity: No heavy lifting >20 pounds (children, pets, laundry, garbage) or strenuous activity until follow-up, but light activity and walking are encouraged. Do not drive or drink alcohol if taking narcotic pain medications.  Wound care: May shower with soapy water and pat dry (do not rub incisions), but no baths or submerging incision underwater until follow-up. (no swimming)   Medications: Resume all home medications.   Call office [phone]) at any time if any questions, worsening pain, fevers/chills, bleeding, drainage from incision site, or other concerns.

## 2022-05-29 NOTE — Progress Notes (Signed)
OT Cancellation Note  Patient Details Name: [name] MRN: [id number] DOB: 07/20/1946   Cancelled Treatment:    Reason Eval/Treat Not Completed: OT screened, no needs identified, will sign off.  Pt received seated in recliner, wife and caregiver [name]) at bedside; pt just walked with PT; OT discussed with PT and pt was walking well. Pt has all needed DME/AD/AE (reacher, BSC, walk-in tub shower, RW, rollator, power w/c, lift recliner); caregiver assists with socks and shoes at baseline; pt (I) with all other ADLs. Caregiver drives at baseline. Pt reporting that he is glad for caregiver to assist at d/c as needed with ADLs; as been on/off toilet in bathroom here since surgery. OT education provided on avoiding bending over to decrease pain in abdomen; pt/caregiver acknowledge understanding. No further OT needs; pt ready to d/c home when cleared by medical team. Pt needing to know if he can get in shower or should sponge bathe at d/c due to incision; medical team notified.  [name], MS, OTR/L  Vania Rea 05/29/2022, 2:07 PM

## 2022-05-29 NOTE — Evaluation (Signed)
Physical Therapy Evaluation Patient Details Name: [name] MRN: [id number] DOB: 06/25/1946 Today's Date: 05/29/2022  History of Present Illness  [name]  has presented for surgery, with the diagnosis of Cholecycitis.  The various methods of treatment have been discussed with the patient and family. After consideration of risks, benefits and other options for treatment, the patient has consented to  Procedure(s):  XI ROBOTIC ASSISTED LAPAROSCOPIC. CHOLECYSTECTOMY (N/A)  INDOCYANINE GREEN FLUORESCENCE IMAGING (ICG) (N/A) as a surgical intervention.  Patient  developed right upper quadrant pain and on workup was found to have cholecystitis with choledocholithiasis. He had ERCP with resolution of choledocholithiasis Robotic Assisted Laparoscopic cholecystectomy was elected.  Clinical Impression  Pt received in bed with wife and a personal care taker by his bedside. Pt agreeable to PT evaluation. Pt A and O x 4. Pt BP remained WFL. Pt performed Bed mobility with Min assist due ot pain in Abdomin with movement to 7/10. At rest 0/10. Pt transfers OOB to Chair with CGA of 1 with FWW. Pt ambulated with CGA of 1 with FWW ~ 100 ft without LOB and no increase pain. Pain after exertion 4/10. Pt tol treatment well. Pt has a person caregiver prior to this admission who care for him and his wife. PT will continue in acute care. Pt will benefit from HHPT and [place] aide for 24 hour supervision for a safe D/C home.    Recommendations for follow up therapy are one component of a multi-disciplinary discharge planning process, led by the attending physician.  Recommendations may be updated based on patient status, additional functional criteria and insurance authorization.  Follow Up Recommendations Home health PT (Home health aide)      Assistance Recommended at Discharge Intermittent Supervision/Assistance  Patient can return home with the following  A little help with walking and/or transfers;A little help  with bathing/dressing/bathroom;Assistance with cooking/housework;Assistance with feeding;Direct supervision/assist for medications management;Direct supervision/assist for financial management;Assist for transportation    Equipment Recommendations None recommended by PT  Recommendations for Other Services       Functional Status Assessment Patient has had a recent decline in their functional status and demonstrates the ability to make significant improvements in function in a reasonable and predictable amount of time.     Precautions / Restrictions Precautions Precautions:  (abdominal) Restrictions Weight Bearing Restrictions: No      Mobility  Bed Mobility Overal bed mobility: Needs Assistance Bed Mobility: Supine to Sit     Supine to sit: Min assist     General bed mobility comments: due to pain in abdomin but pt sleeps in the lift chair.    Transfers Overall transfer level: Needs assistance Equipment used: Rolling walker (2 wheels) Transfers: Sit to/from Stand, Bed to chair/wheelchair/BSC Sit to Stand: Min guard   Step pivot transfers: Min guard            Ambulation/Gait Ambulation/Gait assistance: Min guard Gait Distance (Feet): 100 Feet Assistive device: Rolling walker (2 wheels) Gait Pattern/deviations: Step-through pattern Gait velocity: dec        Stairs            Wheelchair Mobility    Modified Rankin (Stroke Patients Only)       Balance Overall balance assessment: Needs assistance Sitting-balance support: Feet supported Sitting balance-Leahy Scale: Good     Standing balance support: Bilateral upper extremity supported Standing balance-Leahy Scale: Good Standing balance comment: Needs Min guard due to pain and generalized weakness.  Pertinent Vitals/Pain Pain Assessment Pain Assessment: 0-10 Pain Score: 7  (0 at rest) Pain Location: abdomin Pain Descriptors / Indicators: Sharp, Shooting  (deep and intermittent) Pain Intervention(s): Premedicated before session    [place] expects to be discharged to:: Private residence Living Arrangements: Spouse/significant other (personal care taker) Available Help at Discharge: Personal care attendant Type of Home: Other(Comment) (3 bed room condominium without steps) Home Access: Level entry       Home Layout: One level Home Equipment: Conservation officer, nature (2 wheels);Cane - single point;BSC/3in1;Shower seat      Prior Function Prior Level of Function : Independent/Modified Independent             Mobility Comments: Pt ind with SPC or RW at household level and community level activity participation. [place] aide drives pt to appointments and shopping. ADLs Comments: Ind with ADLs and IADLs     Hand Dominance   Dominant Hand: Right    Extremity/Trunk Assessment   Upper Extremity Assessment Upper Extremity Assessment: Defer to OT evaluation    Lower Extremity Assessment Lower Extremity Assessment: Generalized weakness       Communication   Communication: No difficulties  Cognition Arousal/Alertness: Awake/alert Behavior During Therapy: WFL for tasks assessed/performed Overall Cognitive Status: Within Functional Limits for tasks assessed                                          General Comments      Exercises     Assessment/Plan    PT Assessment Patient needs continued PT services  PT Problem List Decreased strength;Decreased activity tolerance;Decreased balance;Pain;Decreased skin integrity       PT Treatment Interventions Gait training;Functional mobility training;Therapeutic activities;Therapeutic exercise;Balance training    PT Goals (Current goals can be found in the Care Plan section)  Acute Rehab PT Goals Patient Stated Goal: " i want to go home." PT Goal Formulation: With patient Time For Goal Achievement: 06/12/22 Potential to Achieve Goals: Good Additional  Goals Additional Goal #1: Pt will demonstrate good standing dyynamic balance to rpevent falls and imprve safety with all funcitonal mobility.    Frequency Min 2X/week     Co-evaluation               AM-PAC PT "6 Clicks" Mobility  Outcome Measure Help needed turning from your back to your side while in a flat bed without using bedrails?: A Little Help needed moving from lying on your back to sitting on the side of a flat bed without using bedrails?: A Little Help needed moving to and from a bed to a chair (including a wheelchair)?: A Little Help needed standing up from a chair using your arms (e.g., wheelchair or bedside chair)?: A Little Help needed to walk in hospital room?: A Little Help needed climbing 3-5 steps with a railing? : Total 6 Click Score: 16    End of Session Equipment Utilized During Treatment: Gait belt       PT Visit Diagnosis: Unsteadiness on feet (R26.81);Muscle weakness (generalized) (M62.81);Pain Pain - part of body:  (abdomin)    Time: 3545-6256 PT Time Calculation (min) (ACUTE ONLY): 38 min   Charges:   PT Evaluation $PT Eval Low Complexity: 1 Low PT Treatments $Gait Training: 8-22 mins $Therapeutic Activity: 8-22 mins       Deondrick Searls PT DPT 2:29 PM,05/29/22

## 2022-05-29 NOTE — Anesthesia Postprocedure Evaluation (Signed)
Anesthesia Post Note  Patient: [name]  Procedure(s) Performed: XI ROBOTIC ASSISTED LAPAROSCOPIC CHOLECYSTECTOMY (Abdomen) INDOCYANINE GREEN FLUORESCENCE IMAGING (ICG)  Patient location during evaluation: PACU Anesthesia Type: General Level of consciousness: awake and alert, oriented and patient cooperative Pain management: pain level controlled Vital Signs Assessment: post-procedure vital signs reviewed and stable Respiratory status: spontaneous breathing, nonlabored ventilation and respiratory function stable Cardiovascular status: blood pressure returned to baseline and stable Postop Assessment: adequate PO intake Anesthetic complications: no   No notable events documented.   Last Vitals:  Vitals:   05/28/22 1935 05/29/22 0428  BP: (!) 140/86 (!) 149/86  Pulse: 82 79  Resp: 20 20  Temp: 37 C 36.6 C  SpO2: 99% 98%    Last Pain:  Vitals:   05/29/22 0428  TempSrc: Oral  PainSc:                  Darrin Nipper

## 2022-05-29 NOTE — Care Management Important Message (Signed)
Important Message  Patient Details  Name: [name] MRN: [id number] Date of Birth: 1945/09/13   Medicare Important Message Given:  Yes     Dannette Barbara 05/29/2022, 11:29 AM

## 2022-06-01 ENCOUNTER — Telehealth: Payer: Self-pay

## 2022-06-01 NOTE — Telephone Encounter (Signed)
Transition Care Management Follow-up Telephone Call Date of discharge and from where: [place] 05-29-22 Dx: acute cholecystitis How have you been since you were released from the hospital? Pretty sore in abdomen  Any questions or concerns? No  Items Reviewed: Did the pt receive and understand the discharge instructions provided? Yes  Medications obtained and verified? Yes  Other? No  Any new allergies since your discharge? No  Dietary orders reviewed? Yes Do you have support at home? Yes   Home Care and Equipment/Supplies: Were home health services ordered? no If so, what is the name of the agency? na  Has the agency set up a time to come to the patient's home? not applicable Were any new equipment or medical supplies ordered?  No What is the name of the medical supply agency? na Were you able to get the supplies/equipment? not applicable Do you have any questions related to the use of the equipment or supplies? No  Functional Questionnaire: (I = Independent and D = Dependent) ADLs: I  Bathing/Dressing- I  Meal Prep- I  Eating- I  Maintaining continence- I  Transferring/Ambulation- I-WALKER   Managing Meds- D-wife manages   Follow up appointments reviewed:  PCP Hospital f/u appt confirmed? Yes  Scheduled to see Dr Diona Browner on 06-05-22 @ [facility] f/u appt confirmed? Yes  Scheduled to see [facility] pain [facility]  on 06-02-22 @ unsure of time . Are transportation arrangements needed? No  If their condition worsens, is the pt aware to call PCP or go to the Emergency Dept.? Yes Was the patient provided with contact information for the PCP's office or ED? Yes Was to pt encouraged to call back with questions or concerns? Yes   Juanda Crumble LPN [place] Direct Dial [phone]

## 2022-06-02 ENCOUNTER — Other Ambulatory Visit: Payer: Self-pay | Admitting: Hospice and Palliative Medicine

## 2022-06-02 DIAGNOSIS — C61 Malignant neoplasm of prostate: Secondary | ICD-10-CM

## 2022-06-02 LAB — SURGICAL PATHOLOGY

## 2022-06-02 NOTE — Progress Notes (Signed)
PSA at time of next labs

## 2022-06-03 ENCOUNTER — Telehealth: Payer: Self-pay | Admitting: General Surgery

## 2022-06-03 ENCOUNTER — Other Ambulatory Visit: Payer: Self-pay | Admitting: Anatomic Pathology & Clinical Pathology

## 2022-06-03 NOTE — Telephone Encounter (Signed)
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 PSA 12 days ago in increasing trend compared to 2 months ago. Dr. Rogue Bussing is not available these days but Altha Harm, NP was notified about findings. Patient has appointment with Oncologist Dr. Darrall Dears on 06/09/22. Patient appreciated call and notification of results.

## 2022-06-03 NOTE — Telephone Encounter (Signed)
Tried to call patient today to discuss pathology results and he did not answer. I left a voicemail letting him know that I will try to call him tomorrow.

## 2022-06-05 ENCOUNTER — Telehealth: Payer: Self-pay | Admitting: *Deleted

## 2022-06-05 ENCOUNTER — Other Ambulatory Visit: Payer: Self-pay | Admitting: *Deleted

## 2022-06-05 ENCOUNTER — Encounter: Payer: Self-pay | Admitting: Family Medicine

## 2022-06-05 ENCOUNTER — Ambulatory Visit (INDEPENDENT_AMBULATORY_CARE_PROVIDER_SITE_OTHER): Payer: Medicare Other | Admitting: Family Medicine

## 2022-06-05 VITALS — BP 110/72 | HR 85 | Temp 97.6°F | Ht 71.5 in | Wt 155.1 lb

## 2022-06-05 DIAGNOSIS — C799 Secondary malignant neoplasm of unspecified site: Secondary | ICD-10-CM

## 2022-06-05 DIAGNOSIS — K810 Acute cholecystitis: Secondary | ICD-10-CM

## 2022-06-05 DIAGNOSIS — R5382 Chronic fatigue, unspecified: Secondary | ICD-10-CM

## 2022-06-05 DIAGNOSIS — C3412 Malignant neoplasm of upper lobe, left bronchus or lung: Secondary | ICD-10-CM

## 2022-06-05 DIAGNOSIS — C61 Malignant neoplasm of prostate: Secondary | ICD-10-CM

## 2022-06-05 NOTE — Progress Notes (Signed)
Patient ID: GRAHM ETSITTY, male    DOB: [birth year], 76 y.o.   MRN: [id number]  This visit was conducted in person.  BP 110/72   Pulse 85   Temp 97.6 F (36.4 C) (Oral)   Ht 5' 11.5" (1.816 m)   Wt 155 lb 2 oz (70.4 kg)   SpO2 98%   BMI 21.33 kg/m    CC:  Chief Complaint  Patient presents with   Hospitalization Follow-up    Subjective:   HPI: [name] is a 76 y.o. male presenting on 06/05/2022 for Hospitalization Follow-up   Admit date:     05/26/2022  Discharge date: 05/29/22  Discharge Physician: [name]    PCP: Jeremiah Sanders, MD    Recommendations at discharge:  Please obtain CBC and CMP in 1 week Follow-up with general surgery according to your appointment Follow with your primary care provider Follow-up with your oncologist   Discharge Diagnoses: Principal Problem:   Acute cholecystitis    rian P [name] is a 76 y.o. male with medical history significant for HTN, prostate cancer s/p prostatectomy 2003, multiple myeloma in remission and more recently stage IV non-small cell lung cancer on palliative chemotherapy and immunotherapy, presents to the ED with a complaint of abdominal pain.  Patient recently saw his PCP for the same and outpatient CT scan was concerning for cholecystitis.  He spoke with surgeon, Jeremiah. Hampton Boyer who recommended reevaluation in the ED.  Patient endorses nausea but no vomiting.  He denies fever or chills.   ED course and data review:Vitals initially within normal limits though BP did dip as low as 85/59, fluid responsive to 152/86.  Blood work with normal WBC.  Mild transaminitis with AST 93 and ALT 64.  Lipase 28. Right upper quadrant ultrasound showing the following: IMPRESSION: 1. Gallstones with gallbladder distension and edematous gallbladder wall thickening. Findings are suspicious for acute cholecystitis, however there is no sonographic Murphy sign. 2. No biliary dilatation.   Outpatient CT abdomen and pelvis done  earlier in the day showed the following IMPRESSION: Gallbladder is distended. Stones are noted in the lumen of gallbladder, neck of gallbladder and distal common bile duct. There is pericholecystic stranding. Findings suggest acute cholecystitis. Follow-up gallbladder sonogram and surgical consultation should be considered.   General surgery and GI was consulted . Patient was started on Zosyn.   10/31: Patient was taken for ERCP by GI today.  Found to have choledocholithiasis, 1 stone was removed.  Tolerated the procedure well.   11/1: S/p ERCP with removal of 1 stone from bile duct.  General surgery is planning cholecystectomy tomorrow.  Patient will remain on clear liquid diet and Zosyn today.   11/2: No acute concerns. Going for cholecystectomy later today.   11/3: Patient underwent successful laparoscopic cholecystectomy with general surgery yesterday.  Tolerated the procedure well.  Labs seems stable.  Patient was able to start on diet and tolerating it well.  Being evaluated by PT and they were recommending home health which was ordered.    Has follow up with surgeon next week. Pain control moderate, improving.  Nml BMs and good appetite. Eating better   Wt Readings from Last 3 Encounters:  06/05/22 155 lb 2 oz (70.4 kg)  05/28/22 157 lb (71.2 kg)  05/06/22 157 lb (71.2 kg)   Surgical pathology showed:  omentum: metastatic adenocarcinoma.  Pt contacted  [name] [facility] and [name] ONC Referred for genetic counseling.  Has appt next week.  Relevant past medical, surgical, family and social history reviewed and updated as indicated. Interim medical history since our last visit reviewed. Allergies and medications reviewed and updated. Outpatient Medications Prior to Visit  Medication Sig Dispense Refill   acetaminophen (TYLENOL) 325 MG tablet Take 2 tablets (650 mg total) by mouth every 6 (six) hours as needed for mild pain (or Fever >/= 101).     B Complex Vitamins  (VITAMIN-B COMPLEX) TABS Take 1 tablet by mouth daily.     Cyanocobalamin (VITAMIN B 12 PO) Take 1,000 mg by mouth daily.     fentaNYL (DURAGESIC) 75 MCG/HR Place 1 patch onto the skin every other day.     folic acid (FOLVITE) 1 MG tablet Take 1 tablet (1 mg total) by mouth daily. 30 tablet 4   lidocaine-prilocaine (EMLA) cream Apply topically as needed. Apply to port site one hour before treatment and cover with plastic wrap 30 g 2   NONFORMULARY OR COMPOUNDED ITEM Baclofn/amitrp/ketamn cream topically to feet TID prn for neuropathy     ondansetron (ZOFRAN) 4 MG tablet Take 4 mg by mouth every 8 (eight) hours as needed.     Oxycodone HCl 10 MG TABS Take 1-2 tablets by mouth 3 (three) times daily as needed.     pantoprazole (PROTONIX) 40 MG tablet Take 1 tablet (40 mg total) by mouth daily. 90 tablet 3   potassium chloride SA (KLOR-CON M) 20 MEQ tablet 1 pill twice a day 60 tablet 3   prochlorperazine (COMPAZINE) 10 MG tablet Take 1 tablet (10 mg total) by mouth every 6 (six) hours as needed for nausea or vomiting. 30 tablet 0   No facility-administered medications prior to visit.     Per HPI unless specifically indicated in ROS section below Review of Systems  Constitutional:  Negative for fatigue and fever.  HENT:  Negative for ear pain.   Eyes:  Negative for pain.  Respiratory:  Negative for cough and shortness of breath.   Cardiovascular:  Negative for chest pain, palpitations and leg swelling.  Gastrointestinal:  Negative for abdominal pain.  Genitourinary:  Negative for dysuria.  Musculoskeletal:  Negative for arthralgias.  Neurological:  Negative for syncope, light-headedness and headaches.  Psychiatric/Behavioral:  Negative for dysphoric mood.    Objective:  BP 110/72   Pulse 85   Temp 97.6 F (36.4 C) (Oral)   Ht 5' 11.5" (1.816 m)   Wt 155 lb 2 oz (70.4 kg)   SpO2 98%   BMI 21.33 kg/m   Wt Readings from Last 3 Encounters:  06/05/22 155 lb 2 oz (70.4 kg)  05/28/22 157  lb (71.2 kg)  05/06/22 157 lb (71.2 kg)      Physical Exam Constitutional:      General: He is not in acute distress.    Appearance: He is well-developed.  HENT:     Head: Normocephalic.     Right Ear: Hearing normal.     Left Ear: Hearing normal.     Nose: Nose normal.  Neck:     Thyroid: No thyroid mass or thyromegaly.     Vascular: No carotid bruit.     Trachea: Trachea normal.  Cardiovascular:     Rate and Rhythm: Normal rate and regular rhythm.     Pulses: Normal pulses.     Heart sounds: Heart sounds not distant. No murmur heard.    No friction rub. No gallop.     Comments: No peripheral edema Pulmonary:     Effort:  Pulmonary effort is normal. No respiratory distress.     Breath sounds: Normal breath sounds.  Skin:    General: Skin is warm and dry.     Findings: No rash.  Neurological:     Mental Status: He is alert.  Psychiatric:        Speech: Speech normal.        Behavior: Behavior normal.        Thought Content: Thought content normal.       Results for orders placed or performed during the hospital encounter of 05/26/22  Lipase, blood  Result Value Ref Range   Lipase 31 11 - 51 U/L  Comprehensive metabolic panel  Result Value Ref Range   Sodium 137 135 - 145 mmol/L   Potassium 3.7 3.5 - 5.1 mmol/L   Chloride 106 98 - 111 mmol/L   CO2 23 22 - 32 mmol/L   Glucose, Bld 126 (H) 70 - 99 mg/dL   BUN 20 8 - 23 mg/dL   Creatinine, Ser 1.03 0.61 - 1.24 mg/dL   Calcium 9.1 8.9 - 10.3 mg/dL   Total Protein 7.5 6.5 - 8.1 g/dL   Albumin 3.0 (L) 3.5 - 5.0 g/dL   AST 109 (H) 15 - 41 U/L   ALT 66 (H) 0 - 44 U/L   Alkaline Phosphatase 118 38 - 126 U/L   Total Bilirubin 0.6 0.3 - 1.2 mg/dL   GFR, Estimated >60 >60 mL/min   Anion gap 8 5 - 15  CBC  Result Value Ref Range   WBC 5.5 4.0 - 10.5 K/uL   RBC 3.54 (L) 4.22 - 5.81 MIL/uL   Hemoglobin 12.3 (L) 13.0 - 17.0 g/dL   HCT 35.8 (L) 39.0 - 52.0 %   MCV 101.1 (H) 80.0 - 100.0 fL   MCH 34.7 (H) 26.0 - 34.0 pg    MCHC 34.4 30.0 - 36.0 g/dL   RDW 11.9 11.5 - 15.5 %   Platelets 183 150 - 400 K/uL   nRBC 0.0 0.0 - 0.2 %  Comprehensive metabolic panel  Result Value Ref Range   Sodium 136 135 - 145 mmol/L   Potassium 3.3 (L) 3.5 - 5.1 mmol/L   Chloride 105 98 - 111 mmol/L   CO2 24 22 - 32 mmol/L   Glucose, Bld 98 70 - 99 mg/dL   BUN 17 8 - 23 mg/dL   Creatinine, Ser 0.92 0.61 - 1.24 mg/dL   Calcium 8.6 (L) 8.9 - 10.3 mg/dL   Total Protein 7.4 6.5 - 8.1 g/dL   Albumin 2.9 (L) 3.5 - 5.0 g/dL   AST 93 (H) 15 - 41 U/L   ALT 64 (H) 0 - 44 U/L   Alkaline Phosphatase 106 38 - 126 U/L   Total Bilirubin 0.7 0.3 - 1.2 mg/dL   GFR, Estimated >60 >60 mL/min   Anion gap 7 5 - 15  Lipase, blood  Result Value Ref Range   Lipase 28 11 - 51 U/L  CBC with Differential  Result Value Ref Range   WBC 6.1 4.0 - 10.5 K/uL   RBC 3.26 (L) 4.22 - 5.81 MIL/uL   Hemoglobin 11.1 (L) 13.0 - 17.0 g/dL   HCT 32.6 (L) 39.0 - 52.0 %   MCV 100.0 80.0 - 100.0 fL   MCH 34.0 26.0 - 34.0 pg   MCHC 34.0 30.0 - 36.0 g/dL   RDW 11.9 11.5 - 15.5 %   Platelets 183 150 - 400 K/uL   nRBC  0.0 0.0 - 0.2 %   Neutrophils Relative % 51 %   Neutro Abs 3.2 1.7 - 7.7 K/uL   Lymphocytes Relative 20 %   Lymphs Abs 1.2 0.7 - 4.0 K/uL   Monocytes Relative 24 %   Monocytes Absolute 1.5 (H) 0.1 - 1.0 K/uL   Eosinophils Relative 2 %   Eosinophils Absolute 0.1 0.0 - 0.5 K/uL   Basophils Relative 1 %   Basophils Absolute 0.0 0.0 - 0.1 K/uL   WBC Morphology MILD LEFT SHIFT (1-5% METAS, OCC MYELO, OCC BANDS)    RBC Morphology MORPHOLOGY UNREMARKABLE    Smear Review MORPHOLOGY UNREMARKABLE    Immature Granulocytes 2 %   Abs Immature Granulocytes 0.09 (H) 0.00 - 0.07 K/uL  Comprehensive metabolic panel  Result Value Ref Range   Sodium 137 135 - 145 mmol/L   Potassium 3.7 3.5 - 5.1 mmol/L   Chloride 107 98 - 111 mmol/L   CO2 25 22 - 32 mmol/L   Glucose, Bld 113 (H) 70 - 99 mg/dL   BUN 14 8 - 23 mg/dL   Creatinine, Ser 1.16 0.61 - 1.24  mg/dL   Calcium 8.6 (L) 8.9 - 10.3 mg/dL   Total Protein 6.1 (L) 6.5 - 8.1 g/dL   Albumin 2.4 (L) 3.5 - 5.0 g/dL   AST 137 (H) 15 - 41 U/L   ALT 86 (H) 0 - 44 U/L   Alkaline Phosphatase 122 38 - 126 U/L   Total Bilirubin 1.1 0.3 - 1.2 mg/dL   GFR, Estimated >60 >60 mL/min   Anion gap 5 5 - 15  Lipase, blood  Result Value Ref Range   Lipase 28 11 - 51 U/L  CBC  Result Value Ref Range   WBC 4.7 4.0 - 10.5 K/uL   RBC 3.13 (L) 4.22 - 5.81 MIL/uL   Hemoglobin 10.8 (L) 13.0 - 17.0 g/dL   HCT 31.3 (L) 39.0 - 52.0 %   MCV 100.0 80.0 - 100.0 fL   MCH 34.5 (H) 26.0 - 34.0 pg   MCHC 34.5 30.0 - 36.0 g/dL   RDW 12.1 11.5 - 15.5 %   Platelets 174 150 - 400 K/uL   nRBC 0.0 0.0 - 0.2 %  CBC  Result Value Ref Range   WBC 11.6 (H) 4.0 - 10.5 K/uL   RBC 3.28 (L) 4.22 - 5.81 MIL/uL   Hemoglobin 11.2 (L) 13.0 - 17.0 g/dL   HCT 32.3 (L) 39.0 - 52.0 %   MCV 98.5 80.0 - 100.0 fL   MCH 34.1 (H) 26.0 - 34.0 pg   MCHC 34.7 30.0 - 36.0 g/dL   RDW 12.1 11.5 - 15.5 %   Platelets 202 150 - 400 K/uL   nRBC 0.0 0.0 - 0.2 %  Comprehensive metabolic panel  Result Value Ref Range   Sodium 136 135 - 145 mmol/L   Potassium 4.3 3.5 - 5.1 mmol/L   Chloride 101 98 - 111 mmol/L   CO2 25 22 - 32 mmol/L   Glucose, Bld 120 (H) 70 - 99 mg/dL   BUN 12 8 - 23 mg/dL   Creatinine, Ser 1.17 0.61 - 1.24 mg/dL   Calcium 7.9 (L) 8.9 - 10.3 mg/dL   Total Protein 6.4 (L) 6.5 - 8.1 g/dL   Albumin 2.4 (L) 3.5 - 5.0 g/dL   AST 98 (H) 15 - 41 U/L   ALT 61 (H) 0 - 44 U/L   Alkaline Phosphatase 99 38 - 126 U/L   Total  Bilirubin 0.9 0.3 - 1.2 mg/dL   GFR, Estimated >60 >60 mL/min   Anion gap 10 5 - 15  Urinalysis, Routine w reflex microscopic Urine, Clean Catch  Result Value Ref Range   Color, Urine YELLOW (A) YELLOW   APPearance CLEAR (A) CLEAR   Specific Gravity, Urine 1.018 1.005 - 1.030   pH 5.0 5.0 - 8.0   Glucose, UA NEGATIVE NEGATIVE mg/dL   Hgb urine dipstick NEGATIVE NEGATIVE   Bilirubin Urine NEGATIVE  NEGATIVE   Ketones, ur 20 (A) NEGATIVE mg/dL   Protein, ur NEGATIVE NEGATIVE mg/dL   Nitrite NEGATIVE NEGATIVE   Leukocytes,Ua NEGATIVE NEGATIVE   RBC / HPF 0-5 0 - 5 RBC/hpf   WBC, UA 0-5 0 - 5 WBC/hpf   Bacteria, UA NONE SEEN NONE SEEN   Squamous Epithelial / LPF NONE SEEN 0 - 5   Mucus PRESENT   Surgical pathology  Result Value Ref Range   SURGICAL PATHOLOGY      SURGICAL PATHOLOGY CASE: [id number] PATIENT: [name] Surgical Pathology Report     Specimen Submitted: A. Gallbladder B. Omentum; bx  Clinical History: Cholecystitis    DIAGNOSIS: A. GALLBLADDER; CHOLECYSTECTOMY: - ACUTE CALCULOUS CHOLECYSTITIS. - NEGATIVE FOR DYSPLASIA AND MALIGNANCY.  Comment: Following initial evaluation, and identification of malignancy within the omental sampling, the gross specimen was re-examined. No suspicious findings were identified on repeat examination, but additional sections were submitted for microscopic evaluation.  B. OMENTUM; BIOPSY: - POSITIVE FOR MALIGNANCY. - METASTATIC ADENOCARCINOMA, COMPATIBLE WITH PROSTATIC PRIMARY.  Comment: Immunohistochemical studies show tumor cells to be positive for PSA-P, and negative for CK7, CK20, TTF-1, CDX-2, Pax-8, Sox-10, CD56, and chromogranin. These findings support the above diagnosis.  There is insufficient tissue for ancillary testing.  IHC slides were prepared by  [facility] Molecular Biology and Pathology, [place], [place]. All controls stained appropriately.  This test was developed and its performance characteristics determined by [facility]. It has not been cleared or approved by the [place] Food and Drug Administration. The FDA does not require this test to go through premarket FDA review. This test is used for clinical purposes. It should not be regarded as investigational or for research. This laboratory is certified under the Clinical Laboratory Improvement Amendments (CLIA) as qualified to perform high  complexity clinical laboratory testing.  GROSS DESCRIPTION: A. Labeled: Gallbladder Received: Fresh Collection time: 3:05 PM on 05/28/2022 Placed into formalin time: 3:55 PM on 05/28/2022 Size of specimen: 8.2 x 5.5 x 1.3 cm Specimen integrity: Previously disrupted External surface: Gray to purple and glistening with a roughened hepatic surface Wall thickness: 0.5 cm Mucosa: Tan to red and velvety with areas of erosion.  No ch olesterolosis is grossly identified. Cystic duct: The cystic duct is 1.5 x 0.5 x 0.2 cm and patent.  No adjacent lymph nodes are identified. Bile present: Yes, brown and viscous Stones present: Yes, multiple black stones are present within the lumen, 1.0 x 1.0 x 0.4 cm in aggregate Other findings: None noted  Block summary: 1 -cystic duct margin, en face and inked black with representative wall A2-A3 - additional representative mucosa A4-A6 - representative serosa  B. Labeled: Omental biopsy Received: Fresh Collection time: 2:47 PM on 05/28/2022 Placed into formalin time: 3:55 PM on 05/28/2022 Tissue fragment(s): 1 Size: 3.4 x 3.1 x 0.6 cm Description: Received is a yellow, glistening fragment of fibrofatty tissue with multiple white, indurated lesions on the external surface ranging from 0.1 to 0.2 cm Entirely submitted in 4 cassettes.  CM 05/29/2022  Final Diagnosis performed by Allena Napoleon, MD.   Electronically signed 06/02/2022 3:02:40PM The electronic signa ture indicates that the named Attending Pathologist has evaluated the specimen Technical component performed at [facility], [address], [place], [place] [address] Lab: [phone] Dir: Rush Farmer, MD, MMM  Professional component performed at [facility], [facility], [place], [place], [place] [address] Lab: [phone] Dir: Kathi Simpers, MD    *Note: Due to a large number of results and/or encounters for the requested time period, some results have not been  displayed. A complete set of results can be found in Results Review.     COVID 19 screen:  No recent travel or known exposure to COVID19 The patient denies respiratory symptoms of COVID 19 at this time. The importance of social distancing was discussed today.   Assessment and Plan Problem List Items Addressed This Visit     Acute cholecystitis - Primary    Now status post cholecystectomy.  Has upcoming follow-up with surgeon next week.  Pain control moderate, improving.  Nml BMs and good appetite. Eating better      Metastatic adenocarcinoma of unknown origin [facility])    Surgical pathology showed metastatic adenoma of the omentum.  History of multiple myeloma, lung cancer and prostate cancer.  Referred for genetic counseling.  Has upcoming follow-up with Jeremiah. Rog[facility] Bussing oncology.          Jeremiah Lofts, MD

## 2022-06-05 NOTE — Patient Instructions (Signed)
Plan recheck of CBC and CMET with oncology next week. Plan follow up with Dr. Alinda Money and  Dr. Burlene Arnt regarding diagnosis of prostate cancer.

## 2022-06-05 NOTE — Telephone Encounter (Signed)
Attempted to reach patient to discuss genetics referral. Left detailed vm -requesting a call back to discuss his concerns regarding this referral.  Per [name], patient has a h/o multiple cancer primaries. He entered the referral for this reaosn.

## 2022-06-09 ENCOUNTER — Encounter: Payer: Self-pay | Admitting: Internal Medicine

## 2022-06-09 ENCOUNTER — Inpatient Hospital Stay: Payer: Medicare Other | Attending: Hematology and Oncology

## 2022-06-09 ENCOUNTER — Inpatient Hospital Stay: Payer: Medicare Other

## 2022-06-09 ENCOUNTER — Inpatient Hospital Stay (HOSPITAL_BASED_OUTPATIENT_CLINIC_OR_DEPARTMENT_OTHER): Payer: Medicare Other | Admitting: Internal Medicine

## 2022-06-09 VITALS — BP 116/80 | HR 98 | Temp 96.6°F | Resp 19 | Wt 152.5 lb

## 2022-06-09 DIAGNOSIS — Z79899 Other long term (current) drug therapy: Secondary | ICD-10-CM | POA: Insufficient documentation

## 2022-06-09 DIAGNOSIS — C3412 Malignant neoplasm of upper lobe, left bronchus or lung: Secondary | ICD-10-CM

## 2022-06-09 DIAGNOSIS — Z5112 Encounter for antineoplastic immunotherapy: Secondary | ICD-10-CM

## 2022-06-09 DIAGNOSIS — E876 Hypokalemia: Secondary | ICD-10-CM

## 2022-06-09 DIAGNOSIS — C9002 Multiple myeloma in relapse: Secondary | ICD-10-CM

## 2022-06-09 DIAGNOSIS — C61 Malignant neoplasm of prostate: Secondary | ICD-10-CM | POA: Insufficient documentation

## 2022-06-09 DIAGNOSIS — C786 Secondary malignant neoplasm of retroperitoneum and peritoneum: Secondary | ICD-10-CM | POA: Diagnosis not present

## 2022-06-09 DIAGNOSIS — C9001 Multiple myeloma in remission: Secondary | ICD-10-CM | POA: Insufficient documentation

## 2022-06-09 DIAGNOSIS — R5382 Chronic fatigue, unspecified: Secondary | ICD-10-CM

## 2022-06-09 DIAGNOSIS — D7589 Other specified diseases of blood and blood-forming organs: Secondary | ICD-10-CM

## 2022-06-09 LAB — CBC WITH DIFFERENTIAL/PLATELET
Abs Immature Granulocytes: 0.03 10*3/uL (ref 0.00–0.07)
Basophils Absolute: 0.1 10*3/uL (ref 0.0–0.1)
Basophils Relative: 1 %
Eosinophils Absolute: 0.2 10*3/uL (ref 0.0–0.5)
Eosinophils Relative: 3 %
HCT: 33.3 % — ABNORMAL LOW (ref 39.0–52.0)
Hemoglobin: 11.2 g/dL — ABNORMAL LOW (ref 13.0–17.0)
Immature Granulocytes: 0 %
Lymphocytes Relative: 12 %
Lymphs Abs: 0.9 10*3/uL (ref 0.7–4.0)
MCH: 34.5 pg — ABNORMAL HIGH (ref 26.0–34.0)
MCHC: 33.6 g/dL (ref 30.0–36.0)
MCV: 102.5 fL — ABNORMAL HIGH (ref 80.0–100.0)
Monocytes Absolute: 1.1 10*3/uL — ABNORMAL HIGH (ref 0.1–1.0)
Monocytes Relative: 16 %
Neutro Abs: 4.7 10*3/uL (ref 1.7–7.7)
Neutrophils Relative %: 68 %
Platelets: 244 10*3/uL (ref 150–400)
RBC: 3.25 MIL/uL — ABNORMAL LOW (ref 4.22–5.81)
RDW: 12.8 % (ref 11.5–15.5)
WBC: 7 10*3/uL (ref 4.0–10.5)
nRBC: 0 % (ref 0.0–0.2)

## 2022-06-09 LAB — COMPREHENSIVE METABOLIC PANEL
ALT: 18 U/L (ref 0–44)
AST: 44 U/L — ABNORMAL HIGH (ref 15–41)
Albumin: 2.9 g/dL — ABNORMAL LOW (ref 3.5–5.0)
Alkaline Phosphatase: 64 U/L (ref 38–126)
Anion gap: 8 (ref 5–15)
BUN: 12 mg/dL (ref 8–23)
CO2: 26 mmol/L (ref 22–32)
Calcium: 8.7 mg/dL — ABNORMAL LOW (ref 8.9–10.3)
Chloride: 100 mmol/L (ref 98–111)
Creatinine, Ser: 1.01 mg/dL (ref 0.61–1.24)
GFR, Estimated: >60 mL/min (ref >60)
Glucose, Bld: 141 mg/dL — ABNORMAL HIGH (ref 70–99)
Potassium: 3.1 mmol/L — ABNORMAL LOW (ref 3.5–5.1)
Sodium: 134 mmol/L — ABNORMAL LOW (ref 135–145)
Total Bilirubin: 0.4 mg/dL (ref 0.3–1.2)
Total Protein: 7 g/dL (ref 6.5–8.1)

## 2022-06-09 LAB — FOLATE: Folate: 8.8 ng/mL (ref >5.9)

## 2022-06-09 LAB — PSA: Prostatic Specific Antigen: 41.72 ng/mL — ABNORMAL HIGH (ref 0.00–4.00)

## 2022-06-09 LAB — TSH: TSH: 3.347 u[IU]/mL (ref 0.350–4.500)

## 2022-06-09 LAB — VITAMIN B12: Vitamin B-12: 588 pg/mL (ref 180–914)

## 2022-06-09 MED ORDER — SODIUM CHLORIDE 0.9 % IV SOLN
200.0000 mg | Freq: Once | INTRAVENOUS | Status: AC
  Administered 2022-06-09: 200 mg via INTRAVENOUS
  Filled 2022-06-09: qty 8

## 2022-06-09 MED ORDER — SODIUM CHLORIDE 0.9 % IV SOLN
Freq: Once | INTRAVENOUS | Status: AC
  Filled 2022-06-09: qty 250

## 2022-06-09 MED ORDER — HEPARIN SOD (PORK) LOCK FLUSH 100 UNIT/ML IV SOLN
500.0000 [IU] | Freq: Once | INTRAVENOUS | Status: AC | PRN
  Administered 2022-06-09: 500 [IU]
  Filled 2022-06-09: qty 5

## 2022-06-09 MED ORDER — POTASSIUM CHLORIDE 20 MEQ/100ML IV SOLN
20.0000 meq | Freq: Once | INTRAVENOUS | Status: AC
  Administered 2022-06-09: 20 meq via INTRAVENOUS

## 2022-06-09 MED ORDER — PROCHLORPERAZINE MALEATE 10 MG PO TABS
10.0000 mg | ORAL_TABLET | Freq: Once | ORAL | Status: AC
  Administered 2022-06-09: 10 mg via ORAL
  Filled 2022-06-09: qty 1

## 2022-06-09 NOTE — Patient Instructions (Signed)
[facility] AT [place]  Discharge Instructions: Thank you for choosing [facility] to provide your oncology and hematology care.  If you have a lab appointment with the [facility], please go directly to the [facility] and check in at the registration area.  Wear comfortable clothing and clothing appropriate for easy access to any Portacath or PICC line.   We strive to give you quality time with your provider. You may need to reschedule your appointment if you arrive late (15 or more minutes).  Arriving late affects you and other patients whose appointments are after yours.  Also, if you miss three or more appointments without notifying the office, you may be dismissed from the clinic at the provider's discretion.      For prescription refill requests, have your pharmacy contact our office and allow 72 hours for refills to be completed.    Today you received the following chemotherapy and/or immunotherapy agents KEYTRUDA and POTASSIUM      To help prevent nausea and vomiting after your treatment, we encourage you to take your nausea medication as directed.  BELOW ARE SYMPTOMS THAT SHOULD BE REPORTED IMMEDIATELY: *FEVER GREATER THAN 100.4 F (38 C) OR HIGHER *CHILLS OR SWEATING *NAUSEA AND VOMITING THAT IS NOT CONTROLLED WITH YOUR NAUSEA MEDICATION *UNUSUAL SHORTNESS OF BREATH *UNUSUAL BRUISING OR BLEEDING *URINARY PROBLEMS (pain or burning when urinating, or frequent urination) *BOWEL PROBLEMS (unusual diarrhea, constipation, pain near the anus) TENDERNESS IN MOUTH AND THROAT WITH OR WITHOUT PRESENCE OF ULCERS (sore throat, sores in mouth, or a toothache) UNUSUAL RASH, SWELLING OR PAIN  UNUSUAL VAGINAL DISCHARGE OR ITCHING   Items with * indicate a potential emergency and should be followed up as soon as possible or go to the Emergency Department if any problems should occur.  Please show the CHEMOTHERAPY ALERT CARD or IMMUNOTHERAPY ALERT CARD at  check-in to the Emergency Department and triage nurse.  Should you have questions after your visit or need to cancel or reschedule your appointment, please contact [facility] [place] AT [place]  [phone] and follow the prompts.  Office hours are 8:00 a.m. to 4:30 p.m. Monday - Friday. Please note that voicemails left after 4:00 p.m. may not be returned until the following business day.  We are closed weekends and major holidays. You have access to a nurse at all times for urgent questions. Please call the main number to the clinic [phone] and follow the prompts.  For any non-urgent questions, you may also contact your provider using MyChart. We now offer e-Visits for anyone 68 and older to request care online for non-urgent symptoms. For details visit [email or web address].   Also download the MyChart app! Go to the app store, search "MyChart", open the app, select [facility], and log in with your MyChart username and password.  Masks are optional in the cancer centers. If you would like for your care team to wear a mask while they are taking care of you, please let them know. For doctor visits, patients may have with them one support person who is at least 76 years old. At this time, visitors are not allowed in the infusion area.  Pembrolizumab Injection What is this medication? PEMBROLIZUMAB (PEM broe LIZ ue mab) treats some types of cancer. It works by helping your immune system slow or stop the spread of cancer cells. It is a monoclonal antibody. This medicine may be used for other purposes; ask your health care provider or pharmacist if you  have questions. COMMON BRAND NAME(S): Keytruda What should I tell my care team before I take this medication? They need to know if you have any of these conditions: Allogeneic stem cell transplant (uses someone else's stem cells) Autoimmune diseases, such as Crohn disease, ulcerative colitis, lupus History of chest  radiation Nervous system problems, such as Guillain-Barre syndrome, myasthenia gravis Organ transplant An unusual or allergic reaction to pembrolizumab, other medications, foods, dyes, or preservatives Pregnant or trying to get pregnant Breast-feeding How should I use this medication? This medication is injected into a vein. It is given by your care team in a hospital or clinic setting. A special MedGuide will be given to you before each treatment. Be sure to read this information carefully each time. Talk to your care team about the use of this medication in children. While it may be prescribed for children as young as 6 months for selected conditions, precautions do apply. Overdosage: If you think you have taken too much of this medicine contact a poison control center or emergency room at once. NOTE: This medicine is only for you. Do not share this medicine with others. What if I miss a dose? Keep appointments for follow-up doses. It is important not to miss your dose. Call your care team if you are unable to keep an appointment. What may interact with this medication? Interactions have not been studied. This list may not describe all possible interactions. Give your health care provider a list of all the medicines, herbs, non-prescription drugs, or dietary supplements you use. Also tell them if you smoke, drink alcohol, or use illegal drugs. Some items may interact with your medicine. What should I watch for while using this medication? Your condition will be monitored carefully while you are receiving this medication. You may need blood work while taking this medication. This medication may cause serious skin reactions. They can happen weeks to months after starting the medication. Contact your care team right away if you notice fevers or flu-like symptoms with a rash. The rash may be red or purple and then turn into blisters or peeling of the skin. You may also notice a red rash with  swelling of the face, lips, or lymph nodes in your neck or under your arms. Tell your care team right away if you have any change in your eyesight. Talk to your care team if you may be pregnant. Serious birth defects can occur if you take this medication during pregnancy and for 4 months after the last dose. You will need a negative pregnancy test before starting this medication. Contraception is recommended while taking this medication and for 4 months after the last dose. Your care team can help you find the option that works for you. Do not breastfeed while taking this medication and for 4 months after the last dose. What side effects may I notice from receiving this medication? Side effects that you should report to your care team as soon as possible: Allergic reactions--skin rash, itching, hives, swelling of the face, lips, tongue, or throat Dry cough, shortness of breath or trouble breathing Eye pain, redness, irritation, or discharge with blurry or decreased vision Heart muscle inflammation--unusual weakness or fatigue, shortness of breath, chest pain, fast or irregular heartbeat, dizziness, swelling of the ankles, feet, or hands Hormone gland problems--headache, sensitivity to light, unusual weakness or fatigue, dizziness, fast or irregular heartbeat, increased sensitivity to cold or heat, excessive sweating, constipation, hair loss, increased thirst or amount of urine, tremors or shaking,  irritability Infusion reactions--chest pain, shortness of breath or trouble breathing, feeling faint or lightheaded Kidney injury (glomerulonephritis)--decrease in the amount of urine, red or dark brown urine, foamy or bubbly urine, swelling of the ankles, hands, or feet Liver injury--right upper belly pain, loss of appetite, nausea, light-colored stool, dark yellow or brown urine, yellowing skin or eyes, unusual weakness or fatigue Pain, tingling, or numbness in the hands or feet, muscle weakness, change in  vision, confusion or trouble speaking, loss of balance or coordination, trouble walking, seizures Rash, fever, and swollen lymph nodes Redness, blistering, peeling, or loosening of the skin, including inside the mouth Sudden or severe stomach pain, bloody diarrhea, fever, nausea, vomiting Side effects that usually do not require medical attention (report to your care team if they continue or are bothersome): Bone, joint, or muscle pain Diarrhea Fatigue Loss of appetite Nausea Skin rash This list may not describe all possible side effects. Call your doctor for medical advice about side effects. You may report side effects to FDA at 1-800-FDA-1088. Where should I keep my medication? This medication is given in a hospital or clinic. It will not be stored at home. NOTE: This sheet is a summary. It may not cover all possible information. If you have questions about this medicine, talk to your doctor, pharmacist, or health care provider.  2023 Elsevier/Gold Standard (2013-04-03 00:00:00)  Potassium Chloride Injection What is this medication? POTASSIUM CHLORIDE (poe TASS i um KLOOR ide) prevents and treats low levels of potassium in your body. Potassium plays an important role in maintaining the health of your kidneys, heart, muscles, and nervous system. This medicine may be used for other purposes; ask your health care provider or pharmacist if you have questions. COMMON BRAND NAME(S): PROAMP What should I tell my care team before I take this medication? They need to know if you have any of these conditions: Addison disease Dehydration Diabetes (high blood sugar) Heart disease High levels of potassium in the blood Irregular heartbeat or rhythm Kidney disease Large areas of burned skin An unusual or allergic reaction to potassium, other medications, foods, dyes, or preservatives Pregnant or trying to get pregnant Breast-feeding How should I use this medication? This medication is  injected into a vein. It is given in a hospital or clinic setting. Talk to your care team about the use of this medication in children. Special care may be needed. Overdosage: If you think you have taken too much of this medicine contact a poison control center or emergency room at once. NOTE: This medicine is only for you. Do not share this medicine with others. What if I miss a dose? This does not apply. This medication is not for regular use. What may interact with this medication? Do not take this medication with any of the following: Certain diuretics, such as spironolactone, triamterene Eplerenone Sodium polystyrene sulfonate This medication may also interact with the following: Certain medications for blood pressure or heart disease, such as lisinopril, losartan, quinapril, valsartan Medications that lower your chance of fighting infection, such as cyclosporine, tacrolimus NSAIDs, medications for pain and inflammation, such as ibuprofen or naproxen Other potassium supplements Salt substitutes This list may not describe all possible interactions. Give your health care provider a list of all the medicines, herbs, non-prescription drugs, or dietary supplements you use. Also tell them if you smoke, drink alcohol, or use illegal drugs. Some items may interact with your medicine. What should I watch for while using this medication? Visit your care team for  regular checks on your progress. Tell your care team if your symptoms do not start to get better or if they get worse. You may need blood work while you are taking this medication. Avoid salt substitutes unless you are told otherwise by your care team. What side effects may I notice from receiving this medication? Side effects that you should report to your care team as soon as possible: Allergic reactions--skin rash, itching, hives, swelling of the face, lips, tongue, or throat High potassium level--muscle weakness, fast or irregular  heartbeat Side effects that usually do not require medical attention (report to your care team if they continue or are bothersome): Diarrhea Nausea Stomach pain Vomiting This list may not describe all possible side effects. Call your doctor for medical advice about side effects. You may report side effects to FDA at 1-800-FDA-1088. Where should I keep my medication? This medication is given in a hospital or clinic. It will not be stored at home. NOTE: This sheet is a summary. It may not cover all possible information. If you have questions about this medicine, talk to your doctor, pharmacist, or health care provider.  2023 Elsevier/Gold Standard (2020-10-24 00:00:00)

## 2022-06-09 NOTE — Progress Notes (Signed)
[place] NOTE  Patient Care Team: Jinny Sanders, MD as PCP - General (Family Medicine) Jeanann Lewandowsky, MD as Consulting Physician (Hematology and Oncology) Cathlean Marseilles, NP as Nurse Practitioner (Hematology and Oncology) Raynelle Bring, MD as Consulting Physician (Urology) Garrel Ridgel, [place] as Consulting Physician (Podiatry) Morton Stall, Howell Rucks, NP as Nurse Practitioner (Pain Medicine) Druscilla Brownie, MD as Consulting Physician (Dermatology) Bryson Ha, OD as Consulting Physician (Optometry) Nicholas Lose, MD as Consulting Physician (Hematology and Oncology) Telford Nab, RN as Oncology Nurse Navigator  CHIEF COMPLAINTS/PURPOSE OF CONSULTATION: Lung cancer  Oncology History Overview Note  Adenosquamous lung cancer  # MULTIPLE MYELOMA [Dr.Gorsuch]; s/p Auto-Transplant [[facility]]- remission.   # MAY 2023-   # The patient is currently undergoing systemic chemotherapy with carboplatin for AUC of 5, Alimta 500 Mg/M2 and Keytruda 200 Mg IV every 3 weeks status post 1 cycle started on January 29, 2022. [Dr.Mohammed; [place]]  #Prostate cancer prostatectomy 2003; multiple myeloma- 2012-remission   Malignant neoplasm of prostate ([place])  02/23/2007 Initial Diagnosis   Malignant neoplasm of prostate ([place])   10/28/2020 Cancer Staging   Staging form: Prostate, AJCC 6th Edition - Clinical: Stage III (T3, N0, M0) - Signed by Heath Lark, MD on 10/28/2020   11/24/2021 Procedure   Successful ultrasound guided diagnostic and therapeutic left thoracentesis yielding 750 cc of pleural fluid.     12/02/2021 PET scan   1. Hypermetabolic nodular areas of consolidation in the left lung with a hypermetabolic malignant left pleural effusion. Hypermetabolism extends inferiorly along the left hemidiaphragm. Associated hypermetabolic prepericardiac lymph nodes. Findings may be metastatic. Primary bronchogenic carcinoma cannot be excluded. 2. Uptake in the soft tissues of the distal  left foot with associated edema in the soft tissues. Please correlate clinically. 3. Cholelithiasis and choledocholithiasis. No biliary ductal dilatation. 4. Bilateral renal stones. 5. Aortic atherosclerosis (ICD10-I70.0). Coronary artery calcification.     12/18/2021 Procedure   Successful CT guided core needle core biopsy of LEFT lung mass, as above   Multiple myeloma in relapse ([place])  07/09/2011 Initial Diagnosis   Multiple myeloma in relapse ([place])   01/28/2022 - 02/19/2022 Chemotherapy   Patient is on Treatment Plan : LUNG Carboplatin (5) + Pemetrexed (500) + Pembrolizumab (200) D1 q21d Induction x 4 cycles / Maintenance Pemetrexed (500) + Pembrolizumab (200) D1 q21d     01/28/2022 -  Chemotherapy   Patient is on Treatment Plan : Pembrolizumab (200) D1 q21d     Lung cancer ([place])  12/02/2021 PET scan   1. Hypermetabolic nodular areas of consolidation in the left lung with a hypermetabolic malignant left pleural effusion. Hypermetabolism extends inferiorly along the left hemidiaphragm. Associated hypermetabolic prepericardiac lymph nodes. Findings may be metastatic. Primary bronchogenic carcinoma cannot be excluded. 2. Uptake in the soft tissues of the distal left foot with associated edema in the soft tissues. Please correlate clinically. 3. Cholelithiasis and choledocholithiasis. No biliary ductal dilatation. 4. Bilateral renal stones. 5. Aortic atherosclerosis (ICD10-I70.0). Coronary artery calcification.   12/18/2021 Pathology Results   A. LUNG MASS, LEFT, BIOPSY:  -  Adenocarcinoma  -  See comment  Six immunohistochemical stains were performed with adequate control. The adenocarcinoma is diffusely and strongly positive for cytokeratin 7 and the pulmonary adeno marker TTF-1.  The tumor is negative for the pulmonary adeno marker Napsin A.  The tumor does show a patchy focal positivity for the squamous marker p40 suggesting an adenosquamous differentiation to this tumor.  The tumor is  negative for  the prostate markers prostate-specific antigen and prostein.    12/23/2021 Initial Diagnosis   Lung cancer ([place])   12/23/2021 Cancer Staging   Staging form: Lung, AJCC 8th Edition - Clinical stage from 12/23/2021: Stage IVA (cT1c, cN2, cM1a) - Signed by Curt Bears, MD on 01/05/2022 Stage prefix: Initial diagnosis   01/01/2022 Imaging   MRI brain Negative for metastatic disease.   01/28/2022 - 02/19/2022 Chemotherapy   Patient is on Treatment Plan : LUNG Carboplatin (5) + Pemetrexed (500) + Pembrolizumab (200) D1 q21d Induction x 4 cycles / Maintenance Pemetrexed (500) + Pembrolizumab (200) D1 q21d     01/28/2022 -  Chemotherapy   Patient is on Treatment Plan : Pembrolizumab (200) D1 q21d     Malignant neoplasm of upper lobe of left lung ([place])  01/28/2022 -  Chemotherapy   Patient is on Treatment Plan : Pembrolizumab (200) D1 q21d     03/17/2022 Initial Diagnosis   Malignant neoplasm of upper lobe of left lung ([place])   03/17/2022 Cancer Staging   Staging form: Lung, AJCC 8th Edition - Clinical: Stage IVB (cT2, cN2, cM1c) - Signed by Cammie Sickle, MD on 03/17/2022     HISTORY OF PRESENTING ILLNESS: Patient in wheelchair.  Accompanied by his friend.   [name] 76 y.o.  male with multiple medical problems-including history of multiple myeloma in remission; Dx in May 2023- stage IV lung cancer adeno-squamous carcinoma currently single agent Keytruda [poor tolerance to combination chemo-immunotherapy].  Patient seen today prior to infusion for Keytruda.  He was recently admitted to the hospital for acute cholecystitis and underwent cholecystectomy.  Omental biopsy came positive for metastatic prostate cancer.  He denies any leg pain, fever, chills.  Continues to feel weak.   Review of Systems  Constitutional:  Positive for malaise/fatigue and weight loss. Negative for chills, diaphoresis and fever.  HENT:  Negative for nosebleeds and sore throat.   Eyes:   Negative for double vision.  Respiratory:  Negative for cough, hemoptysis, sputum production, shortness of breath and wheezing.   Cardiovascular:  Negative for chest pain, palpitations, orthopnea and leg swelling.  Gastrointestinal:  Negative for abdominal pain, blood in stool, constipation, diarrhea, heartburn, melena, nausea and vomiting.  Genitourinary:  Negative for dysuria, frequency and urgency.  Musculoskeletal:  Positive for back pain and joint pain.  Skin: Negative.  Negative for itching and rash.  Neurological:  Positive for weakness. Negative for dizziness, tingling, focal weakness and headaches.  Endo/Heme/Allergies:  Does not bruise/bleed easily.  Psychiatric/Behavioral:  Negative for depression. The patient is not nervous/anxious and does not have insomnia.     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) 2008   s/p prostatectomy   Prostate cancer ([place]) 03/2002   Renal insufficiency     SURGICAL HISTORY:  Past Surgical History:  Procedure Laterality Date   BASAL CELL CARCINOMA EXCISION Left 10/22/2020   [facility] (Dr. Leonie Man)   CATARACT EXTRACTION W/ INTRAOCULAR LENS IMPLANT Bilateral    DEXA  05/2011   spine 0.7, L femur 0.0, R femur -0.6, normal   ERCP N/A 05/26/2022   Procedure: ENDOSCOPIC RETROGRADE CHOLANGIOPANCREATOGRAPHY (ERCP);  Surgeon: Lucilla Lame,  MD;  [facility]COPY;  Service: Endoscopy;  Laterality: N/A;   KIDNEY STONE SURGERY     KIDNEY STONE SURGERY  06/2009   Stone removed from bladder   MELANOMA EXCISION Left 06/05/2019   Procedure: REEXCISION MELANOMA OF LEFT POSTERIOR AURICLE;  Surgeon: Johnathan Hausen, MD;  Location: [place];  Service: General;  Laterality: Left;   PORTACATH PLACEMENT  08/05/11   right sided portacath   PROSTATECTOMY  04/06/07   TOE AMPUTATION Left 2017   2nd toe left foot    SOCIAL HISTORY: Social History   Socioeconomic History   Marital status: Single    Spouse name: Not on file   Number of children: Not on file   Years of education: Not on file   Highest education level: Not on file  Occupational History   Occupation: Sales    Comment: [employer] [place]  Tobacco Use   Smoking status: Former    Packs/day: 2.00    Years: 30.00    Total pack years: 60.00    Types: Cigarettes    Quit date: 07/27/1996    Years since quitting: 25.8   Smokeless tobacco: Never  Vaping Use   Vaping Use: Never used  Substance and Sexual Activity   Alcohol use: No   Drug use: No   Sexual activity: Never  Other Topics Concern   Not on file  Social History Narrative   [place]- closer to [place]; condos; [wife with stroke- better]. Quit smoking 1998; no alcohol.    Social Determinants of Health   Financial Resource Strain: Not on file  Food Insecurity: No Food Insecurity (05/26/2022)   Hunger Vital Sign    Worried About Running Out of Food in the Last Year: Never true    Ran Out of Food in the Last Year: Never true  Transportation Needs: No Transportation Needs (05/26/2022)   PRAPARE - Hydrologist (Medical): No    Lack of Transportation (Non-Medical): No  Physical Activity: Not on file  Stress: Not on file  Social Connections: Not on file  Intimate Partner Violence: Not At Risk (05/26/2022)   Humiliation, Afraid, Rape, and Kick  questionnaire    Fear of Current or Ex-Partner: No    Emotionally Abused: No    Physically Abused: No    Sexually Abused: No    FAMILY HISTORY: Family History  Problem Relation Age of Onset   Pneumonia Mother    Stroke Father    Cancer Brother        lung   Kidney cancer Sister    Colon cancer Neg Hx     ALLERGIES:  is allergic to celebrex [celecoxib], cymbalta [duloxetine hcl], hydromorphone hcl, meloxicam, oxycontin [oxycodone], percocet [oxycodone-acetaminophen], vicodin [hydrocodone-acetaminophen], and lyrica [pregabalin].  MEDICATIONS:  Current Outpatient Medications  Medication Sig Dispense Refill   acetaminophen (TYLENOL) 325 MG tablet Take 2 tablets (650 mg total) by mouth every 6 (six) hours as needed for mild pain (or Fever >/= 101).     B Complex Vitamins (VITAMIN-B COMPLEX) TABS Take 1 tablet by mouth daily.  Cyanocobalamin (VITAMIN B 12 PO) Take 1,000 mg by mouth daily.     fentaNYL (DURAGESIC) 75 MCG/HR Place 1 patch onto the skin every other day.     folic acid (FOLVITE) 1 MG tablet Take 1 tablet (1 mg total) by mouth daily. 30 tablet 4   lidocaine-prilocaine (EMLA) cream Apply topically as needed. Apply to port site one hour before treatment and cover with plastic wrap 30 g 2   NONFORMULARY OR COMPOUNDED ITEM Baclofn/amitrp/ketamn cream topically to feet TID prn for neuropathy     ondansetron (ZOFRAN) 4 MG tablet Take 4 mg by mouth every 8 (eight) hours as needed.     Oxycodone HCl 10 MG TABS Take 1-2 tablets by mouth 3 (three) times daily as needed.     pantoprazole (PROTONIX) 40 MG tablet Take 1 tablet (40 mg total) by mouth daily. 90 tablet 3   potassium chloride SA (KLOR-CON M) 20 MEQ tablet 1 pill twice a day 60 tablet 3   prochlorperazine (COMPAZINE) 10 MG tablet Take 1 tablet (10 mg total) by mouth every 6 (six) hours as needed for nausea or vomiting. 30 tablet 0   No current facility-administered medications for this visit.    PHYSICAL  EXAMINATION:  Vitals:   06/09/22 0955  BP: 116/80  Pulse: 98  Resp: 19  Temp: (!) 96.6 F (35.9 C)  SpO2: 99%   Filed Weights   06/09/22 0955  Weight: 152 lb 8 oz (69.2 kg)    Physical Exam Vitals and nursing note reviewed.  HENT:     Head: Normocephalic and atraumatic.     Mouth/Throat:     Pharynx: Oropharynx is clear.  Eyes:     Extraocular Movements: Extraocular movements intact.     Pupils: Pupils are equal, round, and reactive to light.  Cardiovascular:     Rate and Rhythm: Normal rate and regular rhythm.  Pulmonary:     Comments: Decreased breath sounds bilaterally.  Abdominal:     Palpations: Abdomen is soft.  Musculoskeletal:        General: Normal range of motion.     Cervical back: Normal range of motion.  Skin:    General: Skin is warm.  Neurological:     General: No focal deficit present.     Mental Status: He is alert and oriented to person, place, and time.  Psychiatric:        Behavior: Behavior normal.        Judgment: Judgment normal.    LABORATORY DATA:  I have reviewed the data as listed Lab Results  Component Value Date   WBC 7.0 06/09/2022   HGB 11.2 (L) 06/09/2022   HCT 33.3 (L) 06/09/2022   MCV 102.5 (H) 06/09/2022   PLT 244 06/09/2022   Recent Labs    05/27/22 0936 05/29/22 0520 06/09/22 0932  NA 137 136 134*  K 3.7 4.3 3.1*  CL 107 101 100  CO2 _0 GLUCOSE 113* 120* 141*  BUN _1 CREATININE 1.16 1.17 1.01  CALCIUM 8.6* 7.9* 8.7*  GFRNONAA >60 >60 >60  PROT 6.1* 6.4* 7.0  ALBUMIN 2.4* 2.4* 2.9*  AST 137* 98* 44*  ALT 86* 61* 18  ALKPHOS 122 99 64  BILITOT 1.1 0.9 0.4     RADIOGRAPHIC STUDIES: I have personally reviewed the radiological images as listed and agreed with the findings in the report. DG C-Arm 1-60 Min-No Report  Result Date: 05/26/2022 Fluoroscopy was utilized by the requesting  physician.  No radiographic interpretation.   US Abdomen Limited RUQ (LIVER/GB)  Result Date:  05/25/2022 CLINICAL DATA:  Right upper quadrant pain. EXAM: ULTRASOUND ABDOMEN LIMITED RIGHT UPPER QUADRANT COMPARISON:  Abdominopelvic CT earlier today FINDINGS: Gallbladder: Distended containing sludge and gallstones. Edematous gallbladder wall thickening measuring from 6-10 mm. No definite pericholecystic fluid. No sonographic Murphy sign noted by sonographer. Common bile duct: Diameter: 6 mm.  No visualized choledocholithiasis. Liver: Technically limited evaluation due to high positioning of the liver. Heterogeneous parenchyma but no evidence of focal lesion. No capsular nodularity. Portal vein is patent on color Doppler imaging with normal direction of blood flow towards the liver. Other: No right upper quadrant ascites. IMPRESSION: 1. Gallstones with gallbladder distension and edematous gallbladder wall thickening. Findings are suspicious for acute cholecystitis, however there is no sonographic Murphy sign. 2. No biliary dilatation. Electronically Signed   By: Keith Rake M.D.   On: 05/25/2022 16:23   CT Abdomen Pelvis Wo Contrast  Result Date: 05/25/2022 CLINICAL DATA:  Abdominal pain EXAM: CT ABDOMEN AND PELVIS WITHOUT CONTRAST TECHNIQUE: Multidetector CT imaging of the abdomen and pelvis was performed following the standard protocol without IV contrast. RADIATION DOSE REDUCTION: This exam was performed according to the departmental dose-optimization program which includes automated exposure control, adjustment of the mA and/or kV according to patient size and/or use of iterative reconstruction technique. COMPARISON:  03/18/2022 FINDINGS: Lower chest: Loculated effusion seen in the posterior left lower lung field has not changed. There are linear densities in lingula with no significant change. There is increase in interstitial markings in the right lower lung fields suggesting scarring from chronic interstitial lung disease. Left hemidiaphragm is elevated. Small pericardial effusion is present.  Coronary artery calcifications are seen. Hepatobiliary: There is no dilation of bile ducts. Gallbladder is distended. There is wall thickening in gallbladder. There is minimal amount of fluid around the gallbladder. Gallbladder stones are seen. There is 4 mm calcific density in the region of neck of the gallbladder. There are calcific densities in the distal course of common bile duct close to the ampulla. Pancreas: Pancreas appears smaller than usual in size. No focal abnormalities are seen. Spleen: Unremarkable. Adrenals/Urinary Tract: Adrenals are unremarkable.There is no hydronephrosis. Bilateral renal stones are seen. Largest of the stones is in the upper pole of left kidney measuring 11 mm. There is 2.7 cm cyst in the anterior midportion of left kidney. Ureters are not dilated. Urinary bladder is not distended. Stomach/Bowel: Stomach is unremarkable. Small bowel loops are not dilated. Appendix is not dilated. There is no significant wall thickening in colon. Few diverticula are seen in colon. Vascular/Lymphatic: There are scattered calcifications in aorta and its major branches. Reproductive: Prostate is not seen suggesting previous removal. Other: There is no ascites or pneumoperitoneum. Small umbilical hernia containing fat is seen. Small bilateral inguinal hernias containing fat are noted. Musculoskeletal: Compression fractures and vertebroplasty are noted in multiple lower thoracic and lumbar vertebral bodies. There is deformity in the right pubic bone which has not changed. IMPRESSION: Gallbladder is distended. Stones are noted in the lumen of gallbladder, neck of gallbladder and distal common bile duct. There is pericholecystic stranding. Findings suggest acute cholecystitis. Follow-up gallbladder sonogram and surgical consultation should be considered. There is no evidence of intestinal obstruction or pneumoperitoneum. There is no hydronephrosis. Bilateral renal stones.  Left renal cyst. Loculated  effusion is seen in the posterior left lower lung fields. Coronary artery calcifications are seen. Scarring is seen in the lower lung fields.  Other findings as described in the body of the report. These results will be called to the ordering clinician or representative by the Radiologist Assistant, and communication documented in the PACS or [employer]. Electronically Signed   By: Elmer Picker M.D.   On: 05/25/2022 09:19   DG Chest 2 View  Result Date: 05/22/2022 CLINICAL DATA:  Cough, history of lung cancer. EXAM: CHEST - 2 VIEW COMPARISON:  February 25, 2022. FINDINGS: The heart size and mediastinal contours are within normal limits. Left-sided pacemaker is unchanged in position. Right lung is clear. Stable left pleural effusion is noted with probable associated pleural thickening and left basilar atelectasis or scarring. The visualized skeletal structures are unremarkable. IMPRESSION: Stable left pleural effusion and pleural thickening is noted with associated left basilar atelectasis or scarring. Electronically Signed   By: Marijo Conception M.D.   On: 05/22/2022 10:23     # May 2023- Stage IV (T1c, N2, M a) non-small cell lung cancer, adenocarcinoma presented with left lingular nodule in addition to pleural nodularity and thickening throughout the left hemothorax as well as malignant left pleural effusion and pericardiac lymphadenopathy diagnosed in  Molecular studies by foundation 1 showed no actionable mutations.  Extreme poor tolerance to status post cycle number 2 of carbo [facility] 26th, 2023]-hospital with sepsis. PDL-1 status-1%.  August 2023 CT scan-chest stable.  Currently on single agent Keytruda.  #Proceed with Keytruda cycle #4 today Labs today reviewed;  acceptable for treatment today.   # Diarrhea- G-1- recommend immoidum prn. Monitor closely on immunotherapy.   # Hypokalemia- 3.1; continue with Keytruda twice daily, he has been taking diet rich in potassium.  We  will add 20 mEq IV.  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  Received Eligard 20-monthinjection on 05/06/2022.  Patient was discharged from hospital on 05/29/2022 after cholecystectomy for acute cholecystitis.  Omental biopsy came back positive for metastatic prostate cancer.  PSA is rising.  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  Discussed about adding androgen receptor blocker such as enzalutamide, darolutamide or apalutamide low-dose and see how he tolerates..  Patient will follow-up with Dr. B in 3 weeks to discuss further.  # Hx of Multiple Myeloma [2012]-  Remission- monitor for now.   # Pain- chronic- none oxycodone [constipation]; fentanyl patch -75 mcg- STABLE.   # IV ACCESS: port flush-   # DISPOSITION: #Proceed with Keytruda today. Follow-up in 3 weeks with Dr. BJacinto Reap labs, [place]      KJane Canary MD 06/09/2022 4:58 PM

## 2022-06-09 NOTE — Telephone Encounter (Signed)
I spoke with patient regarding his genetics referral that Merrily Pew, NP placed. I met with patient and his wife and discussed the reason for there referral. Pt stated that he wanted to discuss the referral with Dr. B before setting the apt up. He understands that Dr. B will be out of the country until after Thanksgiving. He thanked me for updating him and discussing the referral but deferred setting up the apt at this time.

## 2022-06-10 ENCOUNTER — Telehealth: Payer: Self-pay | Admitting: Pharmacist

## 2022-06-10 ENCOUNTER — Encounter: Payer: Self-pay | Admitting: Internal Medicine

## 2022-06-10 ENCOUNTER — Other Ambulatory Visit (HOSPITAL_COMMUNITY): Payer: Self-pay

## 2022-06-10 ENCOUNTER — Telehealth: Payer: Self-pay

## 2022-06-10 DIAGNOSIS — C61 Malignant neoplasm of prostate: Secondary | ICD-10-CM

## 2022-06-10 MED ORDER — ENZALUTAMIDE 40 MG PO CAPS: 80.0000 mg | ORAL_CAPSULE | Freq: Every day | ORAL | 0 refills | Status: DC

## 2022-06-10 MED ORDER — ENZALUTAMIDE 40 MG PO TABS: 80.0000 mg | ORAL_TABLET | Freq: Every day | ORAL | 0 refills | Status: DC

## 2022-06-10 NOTE — Telephone Encounter (Addendum)
Oral Oncology Patient Advocate Encounter  Prior Authorization for Gillermina Phy has been approved.    PA# [id number]  Effective dates: 11.15.23 through 12.31.24  Patients co-pay is $364.50.    Berdine Addison, [facility] Patient [place]  [phone] (phone) [phone] (fax) 06/10/2022 11:18 AM

## 2022-06-10 NOTE — Telephone Encounter (Signed)
Oral Oncology Patient Advocate Encounter   Received notification that prior authorization for Gillermina Phy is required.   PA submitted on 06/10/22  Key [id number]  Status is pending     Berdine Addison, [place] Patient [place]  [phone] (phone) [phone] (fax) 06/10/2022 10:38 AM

## 2022-06-10 NOTE — Telephone Encounter (Signed)
Oral Oncology Pharmacist Encounter  Received new prescription for Xtandi (enzalutamide) for the treatment of metastatic prostate cancer, castration resistant in conjunction with ADT, planned duration until disease progression or unacceptable drug toxicity.  Prescription dose and frequency assessed. MD plans to start patient on a reduced dose and increase the dose as tolerated  Current medication list in Epic reviewed, a few DDIs with enzalutamide identified: Enzalutamide may decrease the serum concentration of fentanyl, ondansetron, and oxycodone. Monitor patient for decreased effectiveness of the listed medications. No baseline dose adjustment needed.   Evaluated chart and no patient barriers to medication adherence identified.   Prescription has been e-scribed to the [facility] for benefits analysis and approval.  Oral Oncology Clinic will continue to follow for insurance authorization, copayment issues, initial counseling and start date.   Darl Pikes, PharmD, BCPS, BCOP, CPP Hematology/Oncology Clinical Pharmacist Practitioner [place]/DB/AP [facility] [phone]  06/10/2022 10:41 AM

## 2022-06-10 NOTE — Addendum Note (Signed)
Addended byJane Canary on: 06/10/2022 10:30 AM   Modules accepted: Orders

## 2022-06-10 NOTE — Telephone Encounter (Signed)
Oral Oncology Patient Advocate Encounter   Began application for assistance for Xtandi through [employer].   Application will be submitted upon completion of necessary supporting documentation.   Xtandi's phone number [phone].   I will continue to check the status until final determination.   Berdine Addison, [facility] Patient [place]  [phone] (phone) [phone] (fax) 06/10/2022 1:21 PM

## 2022-06-11 ENCOUNTER — Other Ambulatory Visit: Payer: Medicare Other

## 2022-06-12 NOTE — Telephone Encounter (Signed)
Left VM for patient to call back to complete PAP enrollment.   Berdine Addison, [facility] Patient [place]  [phone] (phone) [phone] (fax) 06/12/2022 9:12 AM

## 2022-06-15 ENCOUNTER — Encounter
Admission: RE | Admit: 2022-06-15 | Discharge: 2022-06-15 | Disposition: A | Discharge status: Home / Self Care | Payer: Medicare Other | Source: Ambulatory Visit | Attending: Internal Medicine | Admitting: Internal Medicine

## 2022-06-15 DIAGNOSIS — C61 Malignant neoplasm of prostate: Secondary | ICD-10-CM | POA: Diagnosis present

## 2022-06-15 DIAGNOSIS — C3412 Malignant neoplasm of upper lobe, left bronchus or lung: Secondary | ICD-10-CM

## 2022-06-15 MED ORDER — TECHNETIUM TC 99M MEDRONATE IV KIT
23.1500 | PACK | Freq: Once | INTRAVENOUS | Status: AC | PRN
  Administered 2022-06-15: 23.15 via INTRAVENOUS

## 2022-06-16 NOTE — Telephone Encounter (Signed)
Patient requested that I mail his application for signature. I explained that it would be quicker and safer to sign in office or electronically, but they insisted on mailing. Verified address and mailed. Will submit once returned.   Berdine Addison, [facility] Patient [place]  [phone] (phone) [phone] (fax) 06/16/2022 8:21 AM

## 2022-06-16 NOTE — Telephone Encounter (Signed)
Oral Oncology Patient Advocate Encounter   Submitted application for assistance for [employer] to [employer].   Application submitted via e-fax to [phone]   [employer]'s phone number [phone].   I will continue to check the status until final determination.   Berdine Addison, [facility] Patient [place]  [phone] (phone) [phone] (fax) 06/16/2022 11:36 AM

## 2022-06-17 ENCOUNTER — Encounter: Payer: Self-pay | Admitting: Gastroenterology

## 2022-06-17 ENCOUNTER — Ambulatory Visit: Payer: Medicare Other | Admitting: Internal Medicine

## 2022-06-17 ENCOUNTER — Other Ambulatory Visit: Payer: Medicare Other

## 2022-06-17 ENCOUNTER — Ambulatory Visit: Payer: Medicare Other

## 2022-06-23 ENCOUNTER — Ambulatory Visit
Admission: RE | Admit: 2022-06-23 | Discharge: 2022-06-23 | Disposition: A | Discharge status: Home / Self Care | Payer: Medicare Other | Source: Ambulatory Visit | Attending: Internal Medicine | Admitting: Internal Medicine

## 2022-06-23 DIAGNOSIS — C3412 Malignant neoplasm of upper lobe, left bronchus or lung: Secondary | ICD-10-CM

## 2022-06-23 DIAGNOSIS — C61 Malignant neoplasm of prostate: Secondary | ICD-10-CM | POA: Diagnosis present

## 2022-06-24 NOTE — Telephone Encounter (Signed)
Called to check status of application. Informed that copy of Prior Authorization needed to be sent in. Faxed to [phone]. I will continue to follow and update.   Berdine Addison, [facility] Patient [place]  [phone] (phone) [phone] (fax) 06/24/2022 10:40 AM

## 2022-06-26 NOTE — Telephone Encounter (Signed)
Called to check status of application. Confirmed receipt of Prior Authorization Approval and informed that application had been forwarded to Case Manager for final processing. I will continue to follow and update.   Berdine Addison, [facility] Patient [place]  [phone] (phone) [phone] (fax) 06/26/2022 10:56 AM

## 2022-06-29 ENCOUNTER — Telehealth: Payer: Self-pay | Admitting: Internal Medicine

## 2022-06-29 NOTE — Telephone Encounter (Signed)
pt stated that he does not believe appt would be neccessary. Req to cancel

## 2022-06-30 ENCOUNTER — Inpatient Hospital Stay: Payer: Medicare Other

## 2022-06-30 ENCOUNTER — Inpatient Hospital Stay: Payer: Medicare Other | Attending: Hematology and Oncology

## 2022-06-30 ENCOUNTER — Inpatient Hospital Stay (HOSPITAL_BASED_OUTPATIENT_CLINIC_OR_DEPARTMENT_OTHER): Payer: Medicare Other | Admitting: Internal Medicine

## 2022-06-30 ENCOUNTER — Encounter: Payer: Self-pay | Admitting: Internal Medicine

## 2022-06-30 ENCOUNTER — Inpatient Hospital Stay: Payer: Medicare Other | Admitting: Pharmacist

## 2022-06-30 VITALS — BP 128/85 | HR 105 | Temp 96.6°F | Resp 18 | Ht 74.0 in | Wt 143.3 lb

## 2022-06-30 DIAGNOSIS — C3412 Malignant neoplasm of upper lobe, left bronchus or lung: Secondary | ICD-10-CM | POA: Diagnosis present

## 2022-06-30 DIAGNOSIS — Z95828 Presence of other vascular implants and grafts: Secondary | ICD-10-CM

## 2022-06-30 DIAGNOSIS — Z87891 Personal history of nicotine dependence: Secondary | ICD-10-CM | POA: Diagnosis not present

## 2022-06-30 DIAGNOSIS — K529 Noninfective gastroenteritis and colitis, unspecified: Secondary | ICD-10-CM | POA: Diagnosis not present

## 2022-06-30 DIAGNOSIS — C9002 Multiple myeloma in relapse: Secondary | ICD-10-CM

## 2022-06-30 DIAGNOSIS — E876 Hypokalemia: Secondary | ICD-10-CM | POA: Diagnosis not present

## 2022-06-30 DIAGNOSIS — C61 Malignant neoplasm of prostate: Secondary | ICD-10-CM | POA: Insufficient documentation

## 2022-06-30 DIAGNOSIS — R946 Abnormal results of thyroid function studies: Secondary | ICD-10-CM

## 2022-06-30 DIAGNOSIS — J910 Malignant pleural effusion: Secondary | ICD-10-CM | POA: Insufficient documentation

## 2022-06-30 DIAGNOSIS — R972 Elevated prostate specific antigen [PSA]: Secondary | ICD-10-CM

## 2022-06-30 DIAGNOSIS — C9001 Multiple myeloma in remission: Secondary | ICD-10-CM | POA: Diagnosis not present

## 2022-06-30 LAB — COMPREHENSIVE METABOLIC PANEL
ALT: 21 U/L (ref 0–44)
AST: 43 U/L — ABNORMAL HIGH (ref 15–41)
Albumin: 3.4 g/dL — ABNORMAL LOW (ref 3.5–5.0)
Alkaline Phosphatase: 49 U/L (ref 38–126)
Anion gap: 9 (ref 5–15)
BUN: 14 mg/dL (ref 8–23)
CO2: 22 mmol/L (ref 22–32)
Calcium: 8.6 mg/dL — ABNORMAL LOW (ref 8.9–10.3)
Chloride: 106 mmol/L (ref 98–111)
Creatinine, Ser: 1.05 mg/dL (ref 0.61–1.24)
GFR, Estimated: >60 mL/min (ref >60)
Glucose, Bld: 121 mg/dL — ABNORMAL HIGH (ref 70–99)
Potassium: 3.4 mmol/L — ABNORMAL LOW (ref 3.5–5.1)
Sodium: 137 mmol/L (ref 135–145)
Total Bilirubin: 0.9 mg/dL (ref 0.3–1.2)
Total Protein: 7.6 g/dL (ref 6.5–8.1)

## 2022-06-30 LAB — CBC WITH DIFFERENTIAL/PLATELET
Abs Immature Granulocytes: 0.03 10*3/uL (ref 0.00–0.07)
Basophils Absolute: 0.1 10*3/uL (ref 0.0–0.1)
Basophils Relative: 1 %
Eosinophils Absolute: 0.1 10*3/uL (ref 0.0–0.5)
Eosinophils Relative: 1 %
HCT: 39.7 % (ref 39.0–52.0)
Hemoglobin: 13.2 g/dL (ref 13.0–17.0)
Immature Granulocytes: 0 %
Lymphocytes Relative: 10 %
Lymphs Abs: 0.9 10*3/uL (ref 0.7–4.0)
MCH: 33.2 pg (ref 26.0–34.0)
MCHC: 33.2 g/dL (ref 30.0–36.0)
MCV: 100 fL (ref 80.0–100.0)
Monocytes Absolute: 1.1 10*3/uL — ABNORMAL HIGH (ref 0.1–1.0)
Monocytes Relative: 13 %
Neutro Abs: 6.6 10*3/uL (ref 1.7–7.7)
Neutrophils Relative %: 75 %
Platelets: 197 10*3/uL (ref 150–400)
RBC: 3.97 MIL/uL — ABNORMAL LOW (ref 4.22–5.81)
RDW: 13.4 % (ref 11.5–15.5)
WBC: 8.7 10*3/uL (ref 4.0–10.5)
nRBC: 0 % (ref 0.0–0.2)

## 2022-06-30 MED ORDER — HEPARIN SOD (PORK) LOCK FLUSH 100 UNIT/ML IV SOLN
500.0000 [IU] | Freq: Once | INTRAVENOUS | Status: AC
  Administered 2022-06-30: 500 [IU] via INTRAVENOUS
  Filled 2022-06-30: qty 5

## 2022-06-30 NOTE — Progress Notes (Signed)
Severe diarrhea for 1 month that is not relieved by OTC Imodium.  Diarrhea episodes primarily in the morning and after meals.  New upper abdominal pain with touch (right below sternum).  Low back pain 6/10 pain scale, that is worsened with movement.  Difficulty swallowing, no appetite, 8 lb wt loss.

## 2022-06-30 NOTE — Progress Notes (Signed)
[facility] NOTE  Patient Care Team: Jinny Sanders, MD as PCP - General (Family Medicine) Jeanann Lewandowsky, MD as Consulting Physician (Hematology and Oncology) Cathlean Marseilles, NP as Nurse Practitioner (Hematology and Oncology) Raynelle Bring, MD as Consulting Physician (Urology) Garrel Ridgel, [place] as Consulting Physician (Podiatry) Morton Stall, Howell Rucks, NP as Nurse Practitioner (Pain Medicine) Druscilla Brownie, MD as Consulting Physician (Dermatology) Bryson Ha, OD as Consulting Physician (Optometry) Nicholas Lose, MD as Consulting Physician (Hematology and Oncology) Telford Nab, RN as Oncology Nurse Navigator  CHIEF COMPLAINTS/PURPOSE OF CONSULTATION: Lung cancer  Oncology History Overview Note  Adenosquamous lung cancer  # MULTIPLE MYELOMA [Dr.Gorsuch]; s/p Auto-Transplant [[facility]]- remission.   # MAY 2023-   # The patient is currently undergoing systemic chemotherapy with carboplatin for AUC of 5, Alimta 500 Mg/M2 and Keytruda 200 Mg IV every 3 weeks status post 1 cycle started on January 29, 2022. [Dr.Mohammed; [place]]  #Prostate cancer prostatectomy 2003; multiple myeloma- 2012-remission   Malignant neoplasm of prostate ([place])  02/23/2007 Initial Diagnosis   Malignant neoplasm of prostate ([place])   10/28/2020 Cancer Staging   Staging form: Prostate, AJCC 6th Edition - Clinical: Stage III (T3, N0, M0) - Signed by Heath Lark, MD on 10/28/2020   11/24/2021 Procedure   Successful ultrasound guided diagnostic and therapeutic left thoracentesis yielding 750 cc of pleural fluid.     12/02/2021 PET scan   1. Hypermetabolic nodular areas of consolidation in the left lung with a hypermetabolic malignant left pleural effusion. Hypermetabolism extends inferiorly along the left hemidiaphragm. Associated hypermetabolic prepericardiac lymph nodes. Findings may be metastatic. Primary bronchogenic carcinoma cannot be excluded. 2. Uptake in the soft tissues of the distal  left foot with associated edema in the soft tissues. Please correlate clinically. 3. Cholelithiasis and choledocholithiasis. No biliary ductal dilatation. 4. Bilateral renal stones. 5. Aortic atherosclerosis (ICD10-I70.0). Coronary artery calcification.     12/18/2021 Procedure   Successful CT guided core needle core biopsy of LEFT lung mass, as above   Multiple myeloma in relapse ([place])  07/09/2011 Initial Diagnosis   Multiple myeloma in relapse ([place])   01/28/2022 - 02/19/2022 Chemotherapy   Patient is on Treatment Plan : LUNG Carboplatin (5) + Pemetrexed (500) + Pembrolizumab (200) D1 q21d Induction x 4 cycles / Maintenance Pemetrexed (500) + Pembrolizumab (200) D1 q21d     01/28/2022 -  Chemotherapy   Patient is on Treatment Plan : Pembrolizumab (200) D1 q21d     Lung cancer ([place])  12/02/2021 PET scan   1. Hypermetabolic nodular areas of consolidation in the left lung with a hypermetabolic malignant left pleural effusion. Hypermetabolism extends inferiorly along the left hemidiaphragm. Associated hypermetabolic prepericardiac lymph nodes. Findings may be metastatic. Primary bronchogenic carcinoma cannot be excluded. 2. Uptake in the soft tissues of the distal left foot with associated edema in the soft tissues. Please correlate clinically. 3. Cholelithiasis and choledocholithiasis. No biliary ductal dilatation. 4. Bilateral renal stones. 5. Aortic atherosclerosis (ICD10-I70.0). Coronary artery calcification.   12/18/2021 Pathology Results   A. LUNG MASS, LEFT, BIOPSY:  -  Adenocarcinoma  -  See comment  Six immunohistochemical stains were performed with adequate control. The adenocarcinoma is diffusely and strongly positive for cytokeratin 7 and the pulmonary adeno marker TTF-1.  The tumor is negative for the pulmonary adeno marker Napsin A.  The tumor does show a patchy focal positivity for the squamous marker p40 suggesting an adenosquamous differentiation to this tumor.  The tumor is  negative for  the prostate markers prostate-specific antigen and prostein.    12/23/2021 Initial Diagnosis   Lung cancer ([place])   12/23/2021 Cancer Staging   Staging form: Lung, AJCC 8th Edition - Clinical stage from 12/23/2021: Stage IVA (cT1c, cN2, cM1a) - Signed by Curt Bears, MD on 01/05/2022 Stage prefix: Initial diagnosis   01/01/2022 Imaging   MRI brain Negative for metastatic disease.   01/28/2022 - 02/19/2022 Chemotherapy   Patient is on Treatment Plan : LUNG Carboplatin (5) + Pemetrexed (500) + Pembrolizumab (200) D1 q21d Induction x 4 cycles / Maintenance Pemetrexed (500) + Pembrolizumab (200) D1 q21d     01/28/2022 -  Chemotherapy   Patient is on Treatment Plan : Pembrolizumab (200) D1 q21d     Malignant neoplasm of upper lobe of left lung ([place])  01/28/2022 -  Chemotherapy   Patient is on Treatment Plan : Pembrolizumab (200) D1 q21d     03/17/2022 Initial Diagnosis   Malignant neoplasm of upper lobe of left lung ([place])   03/17/2022 Cancer Staging   Staging form: Lung, AJCC 8th Edition - Clinical: Stage IVB (cT2, cN2, cM1c) - Signed by Cammie Sickle, MD on 03/17/2022     HISTORY OF PRESENTING ILLNESS: Patient in wheelchair.  Accompanied by his friend.   [name] 76 y.o.  male with multiple medical problems-including history of multiple myeloma in remission; Dx in May 2023- stage IV lung cancer adeno-squamous carcinoma currently single agent Keytruda [poor tolerance to combination chemo-immunotherapy]-he is here today with results of his restaging CAT scan.  Patient received 3 cycles of single agent Keytruda.  In the interim patient underwent cholecystectomy-incidentally noted on CT of the abdomen/pelvis.   Notes to have diarrhea 1-2 loose stools every day for the last 1 month. Diarrhea episodes primarily in the morning and after meals.  Patient states his appetite is fair.  He has lost weight.  Feels fatigued.  Review of Systems  Constitutional:  Positive  for malaise/fatigue and weight loss. Negative for chills, diaphoresis and fever.  HENT:  Negative for nosebleeds and sore throat.   Eyes:  Negative for double vision.  Respiratory:  Negative for cough, hemoptysis, sputum production, shortness of breath and wheezing.   Cardiovascular:  Negative for chest pain, palpitations, orthopnea and leg swelling.  Gastrointestinal:  Negative for abdominal pain, blood in stool, constipation, diarrhea, heartburn, melena, nausea and vomiting.  Genitourinary:  Negative for dysuria, frequency and urgency.  Musculoskeletal:  Positive for back pain and joint pain.  Skin: Negative.  Negative for itching and rash.  Neurological:  Positive for weakness. Negative for dizziness, tingling, focal weakness and headaches.  Endo/Heme/Allergies:  Does not bruise/bleed easily.  Psychiatric/Behavioral:  Negative for depression. The patient is not nervous/anxious and does not have insomnia.     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 Leonie Man)   CATARACT EXTRACTION W/ INTRAOCULAR LENS IMPLANT Bilateral    DEXA  05/2011   spine 0.7, L femur  0.0, R femur -0.6, normal   ERCP N/A 05/26/2022   Procedure: ENDOSCOPIC RETROGRADE CHOLANGIOPANCREATOGRAPHY (ERCP);  Surgeon: Lucilla Lame, MD;  Location: [facility] ENDOSCOPY;  Service: Endoscopy;  Laterality: N/A;   KIDNEY STONE SURGERY     KIDNEY STONE SURGERY  06/2009   Stone removed from bladder   MELANOMA EXCISION Left 06/05/2019   Procedure: REEXCISION MELANOMA OF LEFT POSTERIOR AURICLE;  Surgeon: Johnathan Hausen, MD;  Location: [facility];  Service: General;  Laterality: Left;   PORTACATH PLACEMENT  08/05/11   right sided portacath   PROSTATECTOMY  04/06/07   TOE AMPUTATION Left 2017   2nd toe left foot    SOCIAL HISTORY: Social History   Socioeconomic History   Marital status: Single    Spouse name: Not on file   Number of children: Not on file   Years of education: Not on file   Highest education level: Not on file  Occupational History   Occupation: Sales    Comment: [employer] [place]  Tobacco Use   Smoking status: Former    Packs/day: 2.00    Years: 30.00    Total pack years: 60.00    Types: Cigarettes    Quit date: 07/27/1996    Years since quitting: 25.9   Smokeless tobacco: Never  Vaping Use   Vaping Use: Never used  Substance and Sexual Activity   Alcohol use: No   Drug use: No   Sexual activity: Never  Other Topics Concern   Not on file  Social History Narrative   [place] closer to [place]; condos; [wife with stroke- better]. Quit smoking 1998; no alcohol.    Social Determinants of Health   Financial Resource Strain: Not on file  Food Insecurity: No Food Insecurity (05/26/2022)   Hunger Vital Sign    Worried About Running Out of Food in the Last Year: Never true    Ran Out of Food in the Last Year: Never true  Transportation Needs: No Transportation Needs (05/26/2022)   PRAPARE - Hydrologist (Medical): No    Lack of Transportation (Non-Medical): No  Physical Activity: Not on file  Stress: Not  on file  Social Connections: Not on file  Intimate Partner Violence: Not At Risk (05/26/2022)   Humiliation, Afraid, Rape, and Kick questionnaire    Fear of Current or Ex-Partner: No    Emotionally Abused: No    Physically Abused: No    Sexually Abused: No    FAMILY HISTORY: Family History  Problem Relation Age of Onset   Pneumonia Mother    Stroke Father    Cancer Brother        lung   Kidney cancer Sister    Colon cancer Neg Hx     ALLERGIES:  is allergic to celebrex [celecoxib], cymbalta [duloxetine hcl], hydromorphone hcl, meloxicam, oxycontin [oxycodone], percocet [oxycodone-acetaminophen], vicodin [hydrocodone-acetaminophen], and lyrica [pregabalin].  MEDICATIONS:  Current Outpatient Medications  Medication Sig Dispense Refill   acetaminophen (TYLENOL) 325 MG tablet Take 2 tablets (650 mg total) by mouth every 6 (six) hours as needed for mild pain (  or Fever >/= 101).     B Complex Vitamins (VITAMIN-B COMPLEX) TABS Take 1 tablet by mouth daily.     Cyanocobalamin (VITAMIN B 12 PO) Take 1,000 mg by mouth daily.     fentaNYL (DURAGESIC) 75 MCG/HR Place 1 patch onto the skin every other day.     folic acid (FOLVITE) 1 MG tablet Take 1 tablet (1 mg total) by mouth daily. 30 tablet 4   lidocaine-prilocaine (EMLA) cream Apply topically as needed. Apply to port site one hour before treatment and cover with plastic wrap 30 g 2   Loperamide HCl (IMODIUM PO) Take by mouth daily as needed.     NONFORMULARY OR COMPOUNDED ITEM Baclofn/amitrp/ketamn cream topically to feet TID prn for neuropathy     ondansetron (ZOFRAN) 4 MG tablet Take 4 mg by mouth every 8 (eight) hours as needed.     Oxycodone HCl 10 MG TABS Take 1-2 tablets by mouth 3 (three) times daily as needed.     pantoprazole (PROTONIX) 40 MG tablet Take 1 tablet (40 mg total) by mouth daily. 90 tablet 3   potassium chloride SA (KLOR-CON M) 20 MEQ tablet 1 pill twice a day 60 tablet 3   prochlorperazine (COMPAZINE) 10 MG tablet  Take 1 tablet (10 mg total) by mouth every 6 (six) hours as needed for nausea or vomiting. 30 tablet 0   enzalutamide (XTANDI) 40 MG tablet Take 2 tablets (80 mg total) by mouth daily. (Patient not taking: Reported on 06/30/2022) 60 tablet 0   No current facility-administered medications for this visit.    PHYSICAL EXAMINATION:  Vitals:   06/30/22 1000  BP: 128/85  Pulse: (!) 105  Resp: 18  Temp: (!) 96.6 F (35.9 C)   Filed Weights   06/30/22 1000  Weight: 143 lb 4.8 oz (65 kg)    Physical Exam Vitals and nursing note reviewed.  HENT:     Head: Normocephalic and atraumatic.     Mouth/Throat:     Pharynx: Oropharynx is clear.  Eyes:     Extraocular Movements: Extraocular movements intact.     Pupils: Pupils are equal, round, and reactive to light.  Cardiovascular:     Rate and Rhythm: Normal rate and regular rhythm.  Pulmonary:     Comments: Decreased breath sounds bilaterally.  Abdominal:     Palpations: Abdomen is soft.  Musculoskeletal:        General: Normal range of motion.     Cervical back: Normal range of motion.  Skin:    General: Skin is warm.  Neurological:     General: No focal deficit present.     Mental Status: He is alert and oriented to person, place, and time.  Psychiatric:        Behavior: Behavior normal.        Judgment: Judgment normal.    LABORATORY DATA:  I have reviewed the data as listed Lab Results  Component Value Date   WBC 8.7 06/30/2022   HGB 13.2 06/30/2022   HCT 39.7 06/30/2022   MCV 100.0 06/30/2022   PLT 197 06/30/2022   Recent Labs    05/29/22 0520 06/09/22 0932 06/30/22 0958  NA 136 134* 137  K 4.3 3.1* 3.4*  CL 101 100 106  CO2 _0 GLUCOSE 120* 141* 121*  BUN _1 CREATININE 1.17 1.01 1.05  CALCIUM 7.9* 8.7* 8.6*  GFRNONAA >60 >60 >60  PROT 6.4* 7.0 7.6  ALBUMIN 2.4* 2.9* 3.4*  AST 98* 44* 43*  ALT 61* 18 21  ALKPHOS 99 64 49  BILITOT 0.9 0.4 0.9    RADIOGRAPHIC STUDIES: I have personally  reviewed the radiological images as listed and agreed with the findings in the report. CT CHEST WO CONTRAST  Result Date: 06/24/2022 CLINICAL DATA:  Restaging CT. Left upper lobe carcinoma with malignant pleural effusion diagnosed by needle biopsy May 2023, initially started then discontinued chemotherapy in July. There is shortness of breath and generalized weakness and fatigue. History of prostate cancer 2003, multiple myeloma 2012. EXAM: CT CHEST WITHOUT CONTRAST TECHNIQUE: Multidetector CT imaging of the chest was performed following the standard protocol without IV contrast. RADIATION DOSE REDUCTION: This exam was performed according to the departmental dose-optimization program which includes automated exposure control, adjustment of the mA and/or kV according to patient size and/or use of iterative reconstruction technique. COMPARISON:  Chest, abdomen and pelvis CT with contrast 03/18/2022, PET-CT 12/01/2021 FINDINGS: Cardiovascular: The cardiac size is normal. There is a left chest port with IJ approach catheter terminating in the distal SVC. There is aortic atherosclerosis and three-vessel coronary artery calcifications, scattered calcifications in the aortic valve leaflets the great vessels. There is no aortic aneurysm. Minimal pericardial fluid anteriorly. Normal caliber pulmonary arteries and veins. Mediastinum/Nodes: No intrathoracic or axillary adenopathy is seen. Partially atrophic thyroid without mass. Unremarkable thoracic trachea, thoracic esophagus. Lungs/Pleura: The lungs are mildly emphysematous, with paraseptal and centrilobular changes predominating in the upper lobes and scattered areas of subpleural reticulation with basal predominance. There is mild central bronchial thickening. The anterior left upper lobe mass is again noted extending over several slices. There is stranding between the mass in the anterior pleural surface ambulatory the posterior aspect of the mass in the anterior  hilum, mass previously. At the carinal level, on the last study the mass measured 2.2 x 1.8 cm. At a similar level on today's exam this measures 2.1 x 1.5 cm. Visually it does appear slightly smaller. There is a small loculated left pleural effusion, again surrounded by thickened pleural margins, previously noted to be metastatic on the PET-CT. Adjacent left lower lobe consolidation is also less conspicuous and the effusion is smaller than previously although I do not know if the patient had interval thoracentesis. Both findings were hypermetabolic on PET-CT. Additionally, hypermetabolic nodular pleural thickening in the lingular base is less conspicuous today but still present. There are few linear scar-like opacities in the lungs. Laterally in the base of the right upper lobe, an irregular 7 mm noncalcified nodule is unchanged in size and appearance and was not seen to be hypermetabolic on the PET-CT. Central airways and remaining lungs are clear. Upper Abdomen: Prior studies demonstrated cholelithiasis and a few small stones stacked in the common bile duct, but resolution is not as clear on today's images, although I suspect there are still small stones in the common bile duct. There are nonobstructive caliceal stones in both kidneys, small cyst left upper pole. Pancreatic atrophy. No obvious new findings. No obvious mass in the liver. Musculoskeletal: No aggressive osseous lesion is seen. There is profound osteopenia. Chronic compression fracture T5 vertebral body, mild compression fracture treated with kyphoplasty at T12 and L2. There are no displaced rib fractures. IMPRESSION: 1. The left upper lobe mass appears slightly smaller than on the prior studies. 2. Small loculated left pleural effusion with thickened pleural margins, previously noted to be metastatic on the PET-CT. Smaller effusion than previously. 3. Adjacent left lower lobe consolidation is less conspicuous today  but still present. 4. Stable 7 mm  irregular nodule in the base of the right upper lobe. 5. Emphysema and chronic lung changes. 6. Aortic and coronary artery atherosclerosis. 7. Cholelithiasis and choledocholithiasis. 8. Nonobstructive nephrolithiasis. 9. Osteopenia and chronic compression fractures. Emphysema (ICD10-J43.9). Electronically Signed   By: Telford Nab M.D.   On: 06/24/2022 04:27   NM Bone Scan Whole Body  Result Date: 06/17/2022 CLINICAL DATA:  Prostate cancer. EXAM: NUCLEAR MEDICINE WHOLE BODY BONE SCAN TECHNIQUE: Whole body anterior and posterior images were obtained approximately 3 hours after intravenous injection of radiopharmaceutical. RADIOPHARMACEUTICALS:  23.15 mCi Technetium-55mMDP IV COMPARISON:  Whole-body bone scan 01/17/2020 FINDINGS: Subtle uptake in posterior ribs corresponds to remote fractures. This is stable. Degenerative uptake is again noted previously noted uptake in the right parasymphyseal ischium has resolved. Degenerative uptake in the cervical and lumbar spine is similar the prior study. Degenerative uptake is noted in the knees and shoulders bilaterally. No new foci of uptake are present to suggest metastatic disease to the bone. IMPRESSION: 1. No evidence for skeletal metastases. Electronically Signed   By: CSan MorelleM.D.   On: 06/17/2022 07:51     # May 2023- Stage IV (T1c, N2, M a) non-small cell lung cancer, adenocarcinoma presented with left lingular nodule in addition to pleural nodularity and thickening throughout the left hemothorax as well as malignant left pleural effusion and pericardiac lymphadenopathy diagnosed in  Molecular studies by foundation 1 showed no actionable mutations.  Extreme poor tolerance to status post cycle number [facility]. PDL-1 status-1%.  August 2023 CT scan-chest stable.  Currently on single agent Keytruda.  #Proceed with Keytruda cycle #3 today Labs today reviewed;  acceptable for treatment  today. SEP 2023- TSH- WNL.  # Diarrhea- G-1- recommend immoidum prn. Monitor closely on immunotherapy.   # Hypokalemia- 3.2; recommd Kdur BID.   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 ELigard q 6 M; give today. Understand palliative nature of the treatment.    # Hx of Multiple Myeloma [2012]-  Remission- monitor for now.   # Pain- chronic- none oxycodone [constipation]; fentanyl patch -75 mcg- STABLE.   # IV ACCESS: port flush-   # DISPOSITION: # Eligard today # Keytruda today # follow up in 3 weeks- MD; labs- cbc/cmp; TSH; Keytruda- Dr.B      GCammie Sickle MD 06/30/2022 12:48 PM

## 2022-06-30 NOTE — Telephone Encounter (Signed)
Called to check status of application and informed by Corene Cornea (Representative for [facility]) that they were waiting on the case manager to attach the Prior Authorization for final processing. Confirmed that everything else was good to go. I requested they have the Case Manager do that as soon as possible and was informed that that message had been sent. I will continue to follow and update.   Berdine Addison, [facility] Patient [place]  [phone] (phone) [phone] (fax) 06/30/2022 8:12 AM

## 2022-06-30 NOTE — Progress Notes (Signed)
Per Dr. Rogue Bussing, [facility] start on hold.

## 2022-06-30 NOTE — Assessment & Plan Note (Addendum)
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  Extreme poor tolerance to status post cycle number 2 of carbo Al[facility] 26th, 2023]-hospital with sepsis. PDL-1 status-1%.  August 2023 CT scan-chest stable.  Currently on single agent Keytruda.  # Status post Keytruda #3-CT scan chest NOV 2023-  The left upper lobe mass appears slightly smaller than on the prior studies. Small loculated left pleural effusion with thickened pleural margins, previously noted to be metastatic on the PET-CT. Smaller effusion than previously;  Adjacent left lower lobe consolidation is less conspicuous today but still present; Stable 7 mm irregular nodule in the base of the right upper lobe.  November 2023-bone scan negative for any metastatic disease.  # I had a long discussion with the patient and family that given patient's poor tolerance to ongoing therapy [diarrhea/fatigue weight loss-see below] I would recommend holding therapy for now.  Reevaluate in approximately 6 weeks for consideration of Keytruda.    # Diarrhea- G-2- ?  Second cholecystectomy versus Keytruda versus infectious.  Check GI PCR; fecal calproectin.  If workup suggestive of inflammatory would recommend prednisone.   # Hypokalemia- 3.4; continue Kdur BID.   #Recurrent castrate resistant prostate cancer- [Dr.Bordern Urology]  s/p ELigard q 6 M; HOLD off X-tandi-given absence of overt metastatic disease noted on the bone scan.  Discussed with Ebony Hail.   # Hx of Multiple Myeloma [2012]-  Remission- monitor for now.   # Pain- chronic- none oxycodone [constipation]; fentanyl patch -75 mcg- STABLE.   # IV ACCESS: port- functional.   #  DISPOSITION: # cup for stool collection # HOLD Keytruda today; DE-access # follow up on JAn 9th, 2024- MD; labs- cbc/cmp; TSH;PSA Keytruda- Dr.B  # I reviewed the blood work- with the patient in detail; also reviewed the imaging independently [as summarized above]; and with the patient in detail.    # 40 minutes face-to-face with the patient discussing the above plan of care; more than 50% of time spent on prognosis/ natural history; counseling and coordination.

## 2022-07-01 ENCOUNTER — Other Ambulatory Visit: Payer: Self-pay

## 2022-07-01 DIAGNOSIS — C3412 Malignant neoplasm of upper lobe, left bronchus or lung: Secondary | ICD-10-CM | POA: Diagnosis not present

## 2022-07-01 DIAGNOSIS — K529 Noninfective gastroenteritis and colitis, unspecified: Secondary | ICD-10-CM

## 2022-07-02 ENCOUNTER — Inpatient Hospital Stay: Payer: Medicare Other

## 2022-07-02 ENCOUNTER — Encounter: Payer: Medicare Other | Admitting: Licensed Clinical Social Worker

## 2022-07-02 NOTE — Telephone Encounter (Signed)
Xtandi hold per Dr. Rogue Bussing

## 2022-07-03 LAB — GI PATHOGEN PANEL BY PCR, STOOL

## 2022-07-06 ENCOUNTER — Telehealth: Payer: Self-pay | Admitting: *Deleted

## 2022-07-06 MED ORDER — PREDNISONE 20 MG PO TABS: ORAL_TABLET | ORAL | 0 refills | Status: DC

## 2022-07-06 NOTE — Telephone Encounter (Signed)
Pt had left message on triage line this morning requesting results of fecal sample given last week.  RN stated results were back, pt states he has continued to have loose stools daily since giving sample with the exception of today. Pt stated provider had reviewed with pt that medication maybe ordered depending on the outcome of results.

## 2022-07-06 NOTE — Telephone Encounter (Signed)
Please inform pt that I have sent script for prednisone for colitis. Will follow up as planned. GB

## 2022-07-06 NOTE — Telephone Encounter (Signed)
Called to check status. Informed that application was still processing, but that the rep did not see anything to indicate it would be denied. Informed we would be hearing back "very shortly" on final status. I will continue to follow and update.  [name], [facility] Patient [place]  [phone] (phone) [phone] (fax) 07/06/2022 8:55 AM

## 2022-07-07 NOTE — Telephone Encounter (Signed)
Called to check status of application. Informed we are still waiting on Case Manager to finalize. Once again requested that they try to expedite this as we have been waiting for weeks now. Was informed that message had been sent. I will continue to call, follow, and update.   Berdine Addison, [facility] Patient [place]  [phone] (phone) [phone] (fax) 07/07/2022 10:23 AM

## 2022-07-08 NOTE — Telephone Encounter (Addendum)
Spoke to [place], our Nurse, learning disability for [employer]. She informed me that the Case Manager has been out sick and just now back to work. [name] has reached out to the case manager to expedite application processing. I will continue to follow and update.   Patient [name] - [id number]  [name], [facility] Patient [name]  [phone] (phone) [phone] (fax) 07/08/2022 4:02 PM

## 2022-07-09 ENCOUNTER — Other Ambulatory Visit (HOSPITAL_COMMUNITY): Payer: Self-pay

## 2022-07-09 NOTE — Telephone Encounter (Signed)
Oral Oncology Patient Advocate Encounter   Received notification that the application for assistance for [name] through [employer] has been approved.   [name]'s phone number [phone].   Effective dates: 12.14.23 through 07/27/23  I have spoken to the patient and gave them the phone number to [employer] to call and setup first fill [phone].  [name], [facility] Patient [place]  [phone] (phone) [phone] (fax) 07/09/2022 10:48 AM

## 2022-07-14 DIAGNOSIS — C799 Secondary malignant neoplasm of unspecified site: Secondary | ICD-10-CM | POA: Insufficient documentation

## 2022-07-14 NOTE — Assessment & Plan Note (Addendum)
Now status post cholecystectomy.  Has upcoming follow-up with surgeon next week.  Pain control moderate, improving.  Nml BMs and good appetite. Eating better

## 2022-07-14 NOTE — Assessment & Plan Note (Addendum)
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  History of multiple myeloma, lung cancer and prostate cancer.  Referred for genetic counseling.  Has upcoming follow-up with Dr. Rogue Bussing oncology.

## 2022-08-04 ENCOUNTER — Inpatient Hospital Stay: Payer: Medicare Other

## 2022-08-04 ENCOUNTER — Encounter: Payer: Self-pay | Admitting: Internal Medicine

## 2022-08-04 ENCOUNTER — Other Ambulatory Visit: Payer: Self-pay

## 2022-08-04 ENCOUNTER — Inpatient Hospital Stay (HOSPITAL_BASED_OUTPATIENT_CLINIC_OR_DEPARTMENT_OTHER): Payer: Medicare Other | Admitting: Internal Medicine

## 2022-08-04 ENCOUNTER — Inpatient Hospital Stay: Payer: Medicare Other | Attending: Hematology and Oncology

## 2022-08-04 ENCOUNTER — Inpatient Hospital Stay: Payer: Medicare Other | Admitting: Internal Medicine

## 2022-08-04 VITALS — BP 136/87 | HR 89 | Temp 96.7°F | Resp 16

## 2022-08-04 DIAGNOSIS — C61 Malignant neoplasm of prostate: Secondary | ICD-10-CM

## 2022-08-04 DIAGNOSIS — C3492 Malignant neoplasm of unspecified part of left bronchus or lung: Secondary | ICD-10-CM

## 2022-08-04 DIAGNOSIS — C9002 Multiple myeloma in relapse: Secondary | ICD-10-CM

## 2022-08-04 DIAGNOSIS — C3412 Malignant neoplasm of upper lobe, left bronchus or lung: Secondary | ICD-10-CM

## 2022-08-04 DIAGNOSIS — Z79899 Other long term (current) drug therapy: Secondary | ICD-10-CM | POA: Insufficient documentation

## 2022-08-04 DIAGNOSIS — Z95828 Presence of other vascular implants and grafts: Secondary | ICD-10-CM

## 2022-08-04 DIAGNOSIS — Z87891 Personal history of nicotine dependence: Secondary | ICD-10-CM | POA: Diagnosis not present

## 2022-08-04 DIAGNOSIS — Z8546 Personal history of malignant neoplasm of prostate: Secondary | ICD-10-CM | POA: Insufficient documentation

## 2022-08-04 DIAGNOSIS — C9001 Multiple myeloma in remission: Secondary | ICD-10-CM | POA: Diagnosis not present

## 2022-08-04 DIAGNOSIS — R5382 Chronic fatigue, unspecified: Secondary | ICD-10-CM

## 2022-08-04 LAB — COMPREHENSIVE METABOLIC PANEL
ALT: 25 U/L (ref 0–44)
AST: 26 U/L (ref 15–41)
Albumin: 3.3 g/dL — ABNORMAL LOW (ref 3.5–5.0)
Alkaline Phosphatase: 64 U/L (ref 38–126)
Anion gap: 9 (ref 5–15)
BUN: 21 mg/dL (ref 8–23)
CO2: 27 mmol/L (ref 22–32)
Calcium: 8.2 mg/dL — ABNORMAL LOW (ref 8.9–10.3)
Chloride: 101 mmol/L (ref 98–111)
Creatinine, Ser: 0.97 mg/dL (ref 0.61–1.24)
GFR, Estimated: >60 mL/min (ref >60)
Glucose, Bld: 146 mg/dL — ABNORMAL HIGH (ref 70–99)
Potassium: 3.7 mmol/L (ref 3.5–5.1)
Sodium: 137 mmol/L (ref 135–145)
Total Bilirubin: 0.7 mg/dL (ref 0.3–1.2)
Total Protein: 6 g/dL — ABNORMAL LOW (ref 6.5–8.1)

## 2022-08-04 LAB — CBC WITH DIFFERENTIAL/PLATELET
Abs Immature Granulocytes: 0.05 10*3/uL (ref 0.00–0.07)
Basophils Absolute: 0 10*3/uL (ref 0.0–0.1)
Basophils Relative: 0 %
Eosinophils Absolute: 0 10*3/uL (ref 0.0–0.5)
Eosinophils Relative: 0 %
HCT: 37.6 % — ABNORMAL LOW (ref 39.0–52.0)
Hemoglobin: 12.8 g/dL — ABNORMAL LOW (ref 13.0–17.0)
Immature Granulocytes: 1 %
Lymphocytes Relative: 7 %
Lymphs Abs: 0.5 10*3/uL — ABNORMAL LOW (ref 0.7–4.0)
MCH: 34.5 pg — ABNORMAL HIGH (ref 26.0–34.0)
MCHC: 34 g/dL (ref 30.0–36.0)
MCV: 101.3 fL — ABNORMAL HIGH (ref 80.0–100.0)
Monocytes Absolute: 0.4 10*3/uL (ref 0.1–1.0)
Monocytes Relative: 5 %
Neutro Abs: 6.4 10*3/uL (ref 1.7–7.7)
Neutrophils Relative %: 87 %
Platelets: 154 10*3/uL (ref 150–400)
RBC: 3.71 MIL/uL — ABNORMAL LOW (ref 4.22–5.81)
RDW: 16 % — ABNORMAL HIGH (ref 11.5–15.5)
WBC: 7.4 10*3/uL (ref 4.0–10.5)
nRBC: 0 % (ref 0.0–0.2)

## 2022-08-04 LAB — LIPASE, BLOOD: Lipase: 30 U/L (ref 11–51)

## 2022-08-04 LAB — AMYLASE: Amylase: 52 U/L (ref 28–100)

## 2022-08-04 LAB — TSH: TSH: 2.905 u[IU]/mL (ref 0.350–4.500)

## 2022-08-04 MED ORDER — PREDNISONE 10 MG PO TABS: ORAL_TABLET | ORAL | 0 refills | Status: DC

## 2022-08-04 MED ORDER — HEPARIN SOD (PORK) LOCK FLUSH 100 UNIT/ML IV SOLN
500.0000 [IU] | Freq: Once | INTRAVENOUS | Status: AC
  Administered 2022-08-04: 500 [IU] via INTRAVENOUS
  Filled 2022-08-04: qty 5

## 2022-08-04 NOTE — Progress Notes (Signed)
New Mid chest pain radiates to back for past week.  Oxycodone helps relieve the pain.  Prednisone has really helped his appetite improve and would like to discuss staying on med.

## 2022-08-04 NOTE — Progress Notes (Signed)
[place] NOTE  Patient Care Team: Jinny Sanders, MD as PCP - General (Family Medicine) Jeanann Lewandowsky, MD as Consulting Physician (Hematology and Oncology) Cathlean Marseilles, NP as Nurse Practitioner (Hematology and Oncology) Raynelle Bring, MD as Consulting Physician (Urology) Garrel Ridgel, [place] as Consulting Physician (Podiatry) Morton Stall, Howell Rucks, NP as Nurse Practitioner (Pain Medicine) Druscilla Brownie, MD as Consulting Physician (Dermatology) Bryson Ha, OD as Consulting Physician (Optometry) Nicholas Lose, MD as Consulting Physician (Hematology and Oncology) Telford Nab, RN as Oncology Nurse Navigator  CHIEF COMPLAINTS/PURPOSE OF CONSULTATION: Lung cancer  Oncology History Overview Note  Adenosquamous lung cancer  # MULTIPLE MYELOMA [Dr.Gorsuch]; s/p Auto-Transplant [[facility]]- remission.   # MAY 2023-   # The patient is currently undergoing systemic chemotherapy with carboplatin for AUC of 5, Alimta 500 Mg/M2 and Keytruda 200 Mg IV every 3 weeks status post 1 cycle started on January 29, 2022. [Dr.Mohammed; [place]]  #Prostate cancer prostatectomy 2003; multiple myeloma- 2012-remission   Malignant neoplasm of prostate ([place])  02/23/2007 Initial Diagnosis   Malignant neoplasm of prostate ([place])   10/28/2020 Cancer Staging   Staging form: Prostate, AJCC 6th Edition - Clinical: Stage III (T3, N0, M0) - Signed by Heath Lark, MD on 10/28/2020   11/24/2021 Procedure   Successful ultrasound guided diagnostic and therapeutic left thoracentesis yielding 750 cc of pleural fluid.     12/02/2021 PET scan   1. Hypermetabolic nodular areas of consolidation in the left lung with a hypermetabolic malignant left pleural effusion. Hypermetabolism extends inferiorly along the left hemidiaphragm. Associated hypermetabolic prepericardiac lymph nodes. Findings may be metastatic. Primary bronchogenic carcinoma cannot be excluded. 2. Uptake in the soft tissues of the distal  left foot with associated edema in the soft tissues. Please correlate clinically. 3. Cholelithiasis and choledocholithiasis. No biliary ductal dilatation. 4. Bilateral renal stones. 5. Aortic atherosclerosis (ICD10-I70.0). Coronary artery calcification.     12/18/2021 Procedure   Successful CT guided core needle core biopsy of LEFT lung mass, as above   Multiple myeloma in relapse ([place])  07/09/2011 Initial Diagnosis   Multiple myeloma in relapse ([place])   01/28/2022 - 02/19/2022 Chemotherapy   Patient is on Treatment Plan : LUNG Carboplatin (5) + Pemetrexed (500) + Pembrolizumab (200) D1 q21d Induction x 4 cycles / Maintenance Pemetrexed (500) + Pembrolizumab (200) D1 q21d     01/28/2022 -  Chemotherapy   Patient is on Treatment Plan : Pembrolizumab (200) D1 q21d     Lung cancer ([place])  12/02/2021 PET scan   1. Hypermetabolic nodular areas of consolidation in the left lung with a hypermetabolic malignant left pleural effusion. Hypermetabolism extends inferiorly along the left hemidiaphragm. Associated hypermetabolic prepericardiac lymph nodes. Findings may be metastatic. Primary bronchogenic carcinoma cannot be excluded. 2. Uptake in the soft tissues of the distal left foot with associated edema in the soft tissues. Please correlate clinically. 3. Cholelithiasis and choledocholithiasis. No biliary ductal dilatation. 4. Bilateral renal stones. 5. Aortic atherosclerosis (ICD10-I70.0). Coronary artery calcification.   12/18/2021 Pathology Results   A. LUNG MASS, LEFT, BIOPSY:  -  Adenocarcinoma  -  See comment  Six immunohistochemical stains were performed with adequate control. The adenocarcinoma is diffusely and strongly positive for cytokeratin 7 and the pulmonary adeno marker TTF-1.  The tumor is negative for the pulmonary adeno marker Napsin A.  The tumor does show a patchy focal positivity for the squamous marker p40 suggesting an adenosquamous differentiation to this tumor.  The tumor is  negative for  the prostate markers prostate-specific antigen and prostein.    12/23/2021 Initial Diagnosis   Lung cancer (HCC)   12/23/2021 Cancer Staging   Staging form: Lung, AJCC 8th Edition - Clinical stage from 12/23/2021: Stage IVA (cT1c, cN2, cM1a) - Signed by Si Gaul, MD on 01/05/2022 Stage prefix: Initial diagnosis   01/01/2022 Imaging   MRI brain Negative for metastatic disease.   01/28/2022 - 02/19/2022 Chemotherapy   Patient is on Treatment Plan : LUNG Carboplatin (5) + Pemetrexed (500) + Pembrolizumab (200) D1 q21d Induction x 4 cycles / Maintenance Pemetrexed (500) + Pembrolizumab (200) D1 q21d     01/28/2022 -  Chemotherapy   Patient is on Treatment Plan : Pembrolizumab (200) D1 q21d     Malignant neoplasm of upper lobe of left lung (HCC)  01/28/2022 -  Chemotherapy   Patient is on Treatment Plan : Pembrolizumab (200) D1 q21d     03/17/2022 Initial Diagnosis   Malignant neoplasm of upper lobe of left lung (HCC)   03/17/2022 Cancer Staging   Staging form: Lung, AJCC 8th Edition - Clinical: Stage IVB (cT2, cN2, cM1c) - Signed by Earna Coder, MD on 03/17/2022     HISTORY OF PRESENTING ILLNESS: Patient in wheelchair.  Accompanied by his friend.   [name] 77 y.o.  male with multiple medical problems-including history of multiple myeloma in remission; Dx in May 2023- stage IV lung cancer adeno-squamous carcinoma currently single agent Keytruda [poor tolerance to combination chemo-immunotherapy]-he is here for a follow up.  Patient received 3 cycles of single agent Keytruda-currently on hold because of diarrhea.  Infectious workup for diarrhea negative.  Patient currently on prednisone for his ongoing diarrhea.  Diarrhea is improved.  He complains of intermittent epigastric abdominal pain.  No nausea vomiting.  Patient states his appetite is fair.  He has lost weight.  Feels fatigued.  Review of Systems  Constitutional:  Positive for malaise/fatigue and  weight loss. Negative for chills, diaphoresis and fever.  HENT:  Negative for nosebleeds and sore throat.   Eyes:  Negative for double vision.  Respiratory:  Negative for cough, hemoptysis, sputum production, shortness of breath and wheezing.   Cardiovascular:  Negative for chest pain, palpitations, orthopnea and leg swelling.  Gastrointestinal:  Negative for abdominal pain, blood in stool, constipation, diarrhea, heartburn, melena, nausea and vomiting.  Genitourinary:  Negative for dysuria, frequency and urgency.  Musculoskeletal:  Positive for back pain and joint pain.  Skin: Negative.  Negative for itching and rash.  Neurological:  Positive for weakness. Negative for dizziness, tingling, focal weakness and headaches.  Endo/Heme/Allergies:  Does not bruise/bleed easily.  Psychiatric/Behavioral:  Negative for depression. The patient is not nervous/anxious and does not have insomnia.     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   diffuse scattered osseous metastatic disease   DDD (degenerative disc disease)    Fractures    compression T12,L3   GERD (gastroesophageal reflux disease)    History of chemotherapy    weekly Velcade,Cytoxan   HTN (hypertension)    Hypercalcemia of malignancy    Hyperlipemia    Multiple myeloma 07/09/2011   Multiple myeloma    Myeloma kidney (HCC)    Peripheral neuropathy    toes, sees Dr Terrace Arabia   Prostate cancer [facility]) 2008  s/p prostatectomy   Prostate cancer (HCC) 03/2002   Renal insufficiency     SURGICAL HISTORY: Past Surgical History:  Procedure Laterality Date   BASAL CELL CARCINOMA EXCISION Left 10/22/2020   [facility] (Dr. Georgina Snell)   CATARACT EXTRACTION W/ INTRAOCULAR LENS IMPLANT Bilateral    DEXA  05/2011   spine 0.7, L femur 0.0, R femur -0.6, normal    ERCP N/A 05/26/2022   Procedure: ENDOSCOPIC RETROGRADE CHOLANGIOPANCREATOGRAPHY (ERCP);  Surgeon: Midge Minium, MD;  Location: [facility] ENDOSCOPY;  Service: Endoscopy;  Laterality: N/A;   KIDNEY STONE SURGERY     KIDNEY STONE SURGERY  06/2009   Stone removed from bladder   MELANOMA EXCISION Left 06/05/2019   Procedure: REEXCISION MELANOMA OF LEFT POSTERIOR AURICLE;  Surgeon: Luretha Murphy, MD;  Location: [facility];  Service: General;  Laterality: Left;   PORTACATH PLACEMENT  08/05/11   right sided portacath   PROSTATECTOMY  04/06/07   TOE AMPUTATION Left 2017   2nd toe left foot    SOCIAL HISTORY: Social History   Socioeconomic History   Marital status: Single    Spouse name: Not on file   Number of children: Not on file   Years of education: Not on file   Highest education level: Not on file  Occupational History   Occupation: Sales    Comment: [employer] [place]  Tobacco Use   Smoking status: Former    Packs/day: 2.00    Years: 30.00    Total pack years: 60.00    Types: Cigarettes    Quit date: 07/27/1996    Years since quitting: 26.0   Smokeless tobacco: Never  Vaping Use   Vaping Use: Never used  Substance and Sexual Activity   Alcohol use: No   Drug use: No   Sexual activity: Never  Other Topics Concern   Not on file  Social History Narrative   [place]- closer to [place]; condos; [wife with stroke- better]. Quit smoking 1998; no alcohol.    Social Determinants of Health   Financial Resource Strain: Not on file  Food Insecurity: No Food Insecurity (05/26/2022)   Hunger Vital Sign    Worried About Running Out of Food in the Last Year: Never true    Ran Out of Food in the Last Year: Never true  Transportation Needs: No Transportation Needs (05/26/2022)   PRAPARE - Administrator, Civil Service (Medical): No    Lack of Transportation (Non-Medical): No  Physical Activity: Not on file  Stress: Not on file  Social  Connections: Not on file  Intimate Partner Violence: Not At Risk (05/26/2022)   Humiliation, Afraid, Rape, and Kick questionnaire    Fear of Current or Ex-Partner: No    Emotionally Abused: No    Physically Abused: No    Sexually Abused: No    FAMILY HISTORY: Family History  Problem Relation Age of Onset   Pneumonia Mother    Stroke Father    Cancer Brother        lung   Kidney cancer Sister    Colon cancer Neg Hx     ALLERGIES:  is allergic to celebrex [celecoxib], cymbalta [duloxetine hcl], hydromorphone hcl, meloxicam, oxycontin [oxycodone], percocet [oxycodone-acetaminophen], vicodin [hydrocodone-acetaminophen], and lyrica [pregabalin].  MEDICATIONS:  Current Outpatient Medications  Medication Sig Dispense Refill   acetaminophen (TYLENOL) 325 MG tablet Take 2 tablets (650 mg total) by mouth every 6 (six) hours as needed for mild pain (or Fever >/= 101).  B Complex Vitamins (VITAMIN-B COMPLEX) TABS Take 1 tablet by mouth daily.     Cyanocobalamin (VITAMIN B 12 PO) Take 1,000 mg by mouth daily.     fentaNYL (DURAGESIC) 75 MCG/HR Place 1 patch onto the skin every other day.     folic acid (FOLVITE) 1 MG tablet Take 1 tablet (1 mg total) by mouth daily. 30 tablet 4   lidocaine-prilocaine (EMLA) cream Apply topically as needed. Apply to port site one hour before treatment and cover with plastic wrap 30 g 2   Loperamide HCl (IMODIUM PO) Take by mouth daily as needed.     NONFORMULARY OR COMPOUNDED ITEM Baclofn/amitrp/ketamn cream topically to feet TID prn for neuropathy     ondansetron (ZOFRAN) 4 MG tablet Take 4 mg by mouth every 8 (eight) hours as needed.     Oxycodone HCl 10 MG TABS Take 1-2 tablets by mouth 3 (three) times daily as needed.     pantoprazole (PROTONIX) 40 MG tablet Take 1 tablet (40 mg total) by mouth daily. 90 tablet 3   potassium chloride SA (KLOR-CON M) 20 MEQ tablet 1 pill twice a day 60 tablet 3   prochlorperazine (COMPAZINE) 10 MG tablet Take 1 tablet  (10 mg total) by mouth every 6 (six) hours as needed for nausea or vomiting. 30 tablet 0   enzalutamide (XTANDI) 40 MG tablet Take 2 tablets (80 mg total) by mouth daily. (Patient not taking: Reported on 06/30/2022) 60 tablet 0   predniSONE (DELTASONE) 10 MG tablet Take 1 pill [10 mg] once a day x 2 week; and then 1/2 pill [5mg ] a day- do not STOP until directed. Take with food in AM. 30 tablet 0   No current facility-administered medications for this visit.    PHYSICAL EXAMINATION:  Vitals:   08/04/22 1300  BP: 136/87  Pulse: 89  Resp: 16  Temp: (!) 96.7 F (35.9 C)  SpO2: 97%    There were no vitals filed for this visit.   Physical Exam Vitals and nursing note reviewed.  HENT:     Head: Normocephalic and atraumatic.     Mouth/Throat:     Pharynx: Oropharynx is clear.  Eyes:     Extraocular Movements: Extraocular movements intact.     Pupils: Pupils are equal, round, and reactive to light.  Cardiovascular:     Rate and Rhythm: Normal rate and regular rhythm.  Pulmonary:     Comments: Decreased breath sounds bilaterally.  Abdominal:     Palpations: Abdomen is soft.  Musculoskeletal:        General: Normal range of motion.     Cervical back: Normal range of motion.  Skin:    General: Skin is warm.  Neurological:     General: No focal deficit present.     Mental Status: He is alert and oriented to person, place, and time.  Psychiatric:        Behavior: Behavior normal.        Judgment: Judgment normal.    LABORATORY DATA:  I have reviewed the data as listed Lab Results  Component Value Date   WBC 7.4 08/04/2022   HGB 12.8 (L) 08/04/2022   HCT 37.6 (L) 08/04/2022   MCV 101.3 (H) 08/04/2022   PLT 154 08/04/2022   Recent Labs    06/09/22 0932 06/30/22 0958 08/04/22 1225  NA 134* 137 137  K 3.1* 3.4* 3.7  CL 100 106 101  CO2 26 22 27   GLUCOSE 141* 121* 146*  BUN 12 14 21   CREATININE 1.01 1.05 0.97  CALCIUM 8.7* 8.6* 8.2*  GFRNONAA >60 >60 >60  PROT  7.0 7.6 6.0*  ALBUMIN 2.9* 3.4* 3.3*  AST 44* 43* 26  ALT 18 21 25   ALKPHOS 64 49 64  BILITOT 0.4 0.9 0.7    RADIOGRAPHIC STUDIES: I have personally reviewed the radiological images as listed and agreed with the findings in the report. No results found.   # May 2023- Stage IV (T1c, N2, M a) non-small cell lung cancer, adenocarcinoma presented with left lingular nodule in addition to pleural nodularity and thickening throughout the left hemothorax as well as malignant left pleural effusion and pericardiac lymphadenopathy diagnosed in  Molecular studies by foundation 1 showed no actionable mutations.  Extreme poor tolerance to status post cycle number 2 of carbo Alimta Keytruda [July 26th, 2023]-hospital with sepsis. PDL-1 status-1%.  August 2023 CT scan-chest stable.  Currently on single agent Keytruda.  #Proceed with Keytruda cycle #3 today Labs today reviewed;  acceptable for treatment today. SEP 2023- TSH- WNL.  # Diarrhea- G-1- recommend immoidum prn. Monitor closely on immunotherapy.   # Hypokalemia- 3.2; recommd Kdur BID.   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 ELigard q 6 M; give today. Understand palliative nature of the treatment.    # Hx of Multiple Myeloma [2012]-  Remission- monitor for now.   # Pain- chronic- none oxycodone [constipation]; fentanyl patch -75 mcg- STABLE.   # IV ACCESS: port flush-   # DISPOSITION: # Eligard today # Keytruda today # follow up in 3 weeks- MD; labs- cbc/cmp; TSH; Keytruda- Dr.B      2024, MD 08/17/2022 2:15 PM

## 2022-08-04 NOTE — Assessment & Plan Note (Addendum)
#   May 2023- Stage IV (T1c, N2, M a) non-small cell lung cancer, adenocarcinoma presented with left lingular nodule in addition to pleural nodularity and thickening throughout the left hemothorax as well as malignant left pleural effusion and pericardiac lymphadenopathy diagnosed in  Molecular studies by foundation 1 showed no actionable mutations.  Extreme poor tolerance to status post cycle number 2 of carbo [facility] 26th, 2023]-hospital with sepsis. PDL-1 status-1%.  August 2023 CT scan-chest stable.  Currently on single agent Keytruda.  # Status post Keytruda #3-CT scan chest NOV 2023-  The left upper lobe mass appears slightly smaller than on the prior studies. Small loculated left pleural effusion with thickened pleural margins, previously noted to be metastatic on the PET-CT. Smaller effusion than previously;  Adjacent left lower lobe consolidation is less conspicuous today but still present; Stable 7 mm irregular nodule in the base of the right upper lobe.  November 2023-bone scan negative for any metastatic disease.   # Diarrhea-infectious workup negative currently on prednisone 20 mg/ day. Will continue taper prednisone x1 month. [10 mg x2 w; 5mg /day]  # Epigastric pain/radiating to back- intermittent- ? Gastritis- recommend OTC Tums/ prilosec once a day.   # Weight loss/loss of appetite- sec to malignancy-    # Hypokalemia- 3.4; continue Kdur BID.   #Recurrent castrate resistant prostate cancer- [Dr.Bordern Urology]  s/p ELigard q 6 M; HOLD off X-tandi-given absence of overt metastatic disease noted on the bone scan. Monitor for now.   # Hx of Multiple Myeloma [2012]-  Remission- monitor for now. STABLE.   # Pain- chronic- none oxycodone [constipation]; fentanyl patch -75 mcg-  STABLE.   # IV ACCESS: port- functional.   # DISPOSITION: # add lipase/ amylase # HOLD Keytruda today; DE-access # follow up on 1 month- MD;  labs- cbc/cmp; PSA; NO treatment- Dr.B

## 2022-08-04 NOTE — Patient Instructions (Signed)
#   recommend OTC Tums 2-3 a day; / prilosec once a day.

## 2022-09-04 ENCOUNTER — Encounter: Payer: Self-pay | Admitting: Internal Medicine

## 2022-09-04 ENCOUNTER — Inpatient Hospital Stay (HOSPITAL_BASED_OUTPATIENT_CLINIC_OR_DEPARTMENT_OTHER): Payer: Medicare Other | Admitting: Internal Medicine

## 2022-09-04 ENCOUNTER — Inpatient Hospital Stay: Payer: Medicare Other | Attending: Hematology and Oncology

## 2022-09-04 VITALS — BP 121/77 | HR 93 | Temp 97.6°F | Resp 16 | Wt 145.0 lb

## 2022-09-04 DIAGNOSIS — C61 Malignant neoplasm of prostate: Secondary | ICD-10-CM

## 2022-09-04 DIAGNOSIS — C9002 Multiple myeloma in relapse: Secondary | ICD-10-CM

## 2022-09-04 DIAGNOSIS — K529 Noninfective gastroenteritis and colitis, unspecified: Secondary | ICD-10-CM

## 2022-09-04 DIAGNOSIS — C9001 Multiple myeloma in remission: Secondary | ICD-10-CM | POA: Insufficient documentation

## 2022-09-04 DIAGNOSIS — C3412 Malignant neoplasm of upper lobe, left bronchus or lung: Secondary | ICD-10-CM | POA: Diagnosis not present

## 2022-09-04 DIAGNOSIS — Z79620 Long term (current) use of immunosuppressive biologic: Secondary | ICD-10-CM | POA: Insufficient documentation

## 2022-09-04 DIAGNOSIS — J910 Malignant pleural effusion: Secondary | ICD-10-CM | POA: Insufficient documentation

## 2022-09-04 DIAGNOSIS — R946 Abnormal results of thyroid function studies: Secondary | ICD-10-CM

## 2022-09-04 LAB — CBC WITH DIFFERENTIAL/PLATELET
Abs Immature Granulocytes: 0.02 10*3/uL (ref 0.00–0.07)
Basophils Absolute: 0 10*3/uL (ref 0.0–0.1)
Basophils Relative: 0 %
Eosinophils Absolute: 0.1 10*3/uL (ref 0.0–0.5)
Eosinophils Relative: 1 %
HCT: 39.3 % (ref 39.0–52.0)
Hemoglobin: 13.2 g/dL (ref 13.0–17.0)
Immature Granulocytes: 0 %
Lymphocytes Relative: 16 %
Lymphs Abs: 1.2 10*3/uL (ref 0.7–4.0)
MCH: 34.5 pg — ABNORMAL HIGH (ref 26.0–34.0)
MCHC: 33.6 g/dL (ref 30.0–36.0)
MCV: 102.6 fL — ABNORMAL HIGH (ref 80.0–100.0)
Monocytes Absolute: 1 10*3/uL (ref 0.1–1.0)
Monocytes Relative: 13 %
Neutro Abs: 4.9 10*3/uL (ref 1.7–7.7)
Neutrophils Relative %: 70 %
Platelets: 180 10*3/uL (ref 150–400)
RBC: 3.83 MIL/uL — ABNORMAL LOW (ref 4.22–5.81)
RDW: 13.2 % (ref 11.5–15.5)
WBC: 7.1 10*3/uL (ref 4.0–10.5)
nRBC: 0 % (ref 0.0–0.2)

## 2022-09-04 LAB — COMPREHENSIVE METABOLIC PANEL
ALT: 11 U/L (ref 0–44)
AST: 26 U/L (ref 15–41)
Albumin: 3.3 g/dL — ABNORMAL LOW (ref 3.5–5.0)
Alkaline Phosphatase: 49 U/L (ref 38–126)
Anion gap: 9 (ref 5–15)
BUN: 14 mg/dL (ref 8–23)
CO2: 28 mmol/L (ref 22–32)
Calcium: 8.4 mg/dL — ABNORMAL LOW (ref 8.9–10.3)
Chloride: 99 mmol/L (ref 98–111)
Creatinine, Ser: 1.01 mg/dL (ref 0.61–1.24)
GFR, Estimated: >60 mL/min (ref >60)
Glucose, Bld: 135 mg/dL — ABNORMAL HIGH (ref 70–99)
Potassium: 3.2 mmol/L — ABNORMAL LOW (ref 3.5–5.1)
Sodium: 136 mmol/L (ref 135–145)
Total Bilirubin: 0.4 mg/dL (ref 0.3–1.2)
Total Protein: 6.6 g/dL (ref 6.5–8.1)

## 2022-09-04 LAB — PSA: Prostatic Specific Antigen: 47.87 ng/mL — ABNORMAL HIGH (ref 0.00–4.00)

## 2022-09-04 LAB — TSH: TSH: 6.3 u[IU]/mL — ABNORMAL HIGH (ref 0.350–4.500)

## 2022-09-04 MED ORDER — OLANZAPINE 5 MG PO TABS: 5.0000 mg | ORAL_TABLET | Freq: Every day | ORAL | 1 refills | Status: DC

## 2022-09-04 MED ORDER — HEPARIN SOD (PORK) LOCK FLUSH 100 UNIT/ML IV SOLN
500.0000 [IU] | Freq: Once | INTRAVENOUS | Status: AC
  Administered 2022-09-04: 500 [IU] via INTRAVENOUS
  Filled 2022-09-04: qty 5

## 2022-09-04 NOTE — Progress Notes (Signed)
[place] NOTE  Patient Care Team: Jinny Sanders, MD as PCP - General (Family Medicine) Jeanann Lewandowsky, MD as Consulting Physician (Hematology and Oncology) Cathlean Marseilles, NP as Nurse Practitioner (Hematology and Oncology) Raynelle Bring, MD as Consulting Physician (Urology) Garrel Ridgel, [place] as Consulting Physician (Podiatry) Morton Stall, Howell Rucks, NP as Nurse Practitioner (Pain Medicine) Druscilla Brownie, MD as Consulting Physician (Dermatology) Bryson Ha, OD as Consulting Physician (Optometry) Nicholas Lose, MD as Consulting Physician (Hematology and Oncology) Telford Nab, RN as Oncology Nurse Navigator  CHIEF COMPLAINTS/PURPOSE OF CONSULTATION: Lung cancer  Oncology History Overview Note  Adenosquamous lung cancer  # MULTIPLE MYELOMA [Dr.Gorsuch]; s/p Auto-Transplant [[facility]]- remission.   # MAY 2023-   # The patient is currently undergoing systemic chemotherapy with carboplatin for AUC of 5, Alimta 500 Mg/M2 and Keytruda 200 Mg IV every 3 weeks status post 1 cycle started on January 29, 2022. [Dr.Mohammed; [place]]  #Prostate cancer prostatectomy 2003; multiple myeloma- 2012-remission   Malignant neoplasm of prostate ([place])  02/23/2007 Initial Diagnosis   Malignant neoplasm of prostate ([place])   10/28/2020 Cancer Staging   Staging form: Prostate, AJCC 6th Edition - Clinical: Stage III (T3, N0, M0) - Signed by Heath Lark, MD on 10/28/2020   11/24/2021 Procedure   Successful ultrasound guided diagnostic and therapeutic left thoracentesis yielding 750 cc of pleural fluid.     12/02/2021 PET scan   1. Hypermetabolic nodular areas of consolidation in the left lung with a hypermetabolic malignant left pleural effusion. Hypermetabolism extends inferiorly along the left hemidiaphragm. Associated hypermetabolic prepericardiac lymph nodes. Findings may be metastatic. Primary bronchogenic carcinoma cannot be excluded. 2. Uptake in the soft tissues of the distal  left foot with associated edema in the soft tissues. Please correlate clinically. 3. Cholelithiasis and choledocholithiasis. No biliary ductal dilatation. 4. Bilateral renal stones. 5. Aortic atherosclerosis (ICD10-I70.0). Coronary artery calcification.     12/18/2021 Procedure   Successful CT guided core needle core biopsy of LEFT lung mass, as above   Multiple myeloma in relapse ([place])  07/09/2011 Initial Diagnosis   Multiple myeloma in relapse ([place])   01/28/2022 - 02/19/2022 Chemotherapy   Patient is on Treatment Plan : LUNG Carboplatin (5) + Pemetrexed (500) + Pembrolizumab (200) D1 q21d Induction x 4 cycles / Maintenance Pemetrexed (500) + Pembrolizumab (200) D1 q21d     01/28/2022 -  Chemotherapy   Patient is on Treatment Plan : Pembrolizumab (200) D1 q21d     Lung cancer ([place])  12/02/2021 PET scan   1. Hypermetabolic nodular areas of consolidation in the left lung with a hypermetabolic malignant left pleural effusion. Hypermetabolism extends inferiorly along the left hemidiaphragm. Associated hypermetabolic prepericardiac lymph nodes. Findings may be metastatic. Primary bronchogenic carcinoma cannot be excluded. 2. Uptake in the soft tissues of the distal left foot with associated edema in the soft tissues. Please correlate clinically. 3. Cholelithiasis and choledocholithiasis. No biliary ductal dilatation. 4. Bilateral renal stones. 5. Aortic atherosclerosis (ICD10-I70.0). Coronary artery calcification.   12/18/2021 Pathology Results   A. LUNG MASS, LEFT, BIOPSY:  -  Adenocarcinoma  -  See comment  Six immunohistochemical stains were performed with adequate control. The adenocarcinoma is diffusely and strongly positive for cytokeratin 7 and the pulmonary adeno marker TTF-1.  The tumor is negative for the pulmonary adeno marker Napsin A.  The tumor does show a patchy focal positivity for the squamous marker p40 suggesting an adenosquamous differentiation to this tumor.  The tumor is  negative for  the prostate markers prostate-specific antigen and prostein.    12/23/2021 Initial Diagnosis   Lung cancer ([place])   12/23/2021 Cancer Staging   Staging form: Lung, AJCC 8th Edition - Clinical stage from 12/23/2021: Stage IVA (cT1c, cN2, cM1a) - Signed by Curt Bears, MD on 01/05/2022 Stage prefix: Initial diagnosis   01/01/2022 Imaging   MRI brain Negative for metastatic disease.   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  Accompanied by his friend.   [name] 77 y.o.  male with multiple medical problems-including history of multiple myeloma in remission; Dx in May 2023- stage IV lung cancer adeno-squamous carcinoma is here for a follow up.  Patient received 3 cycles of single agent Keytruda-currently on hold because of diarrhea.  Patient currently on prednisone 5 mg a day.  Diarrhea is improved.  Intermittent constipation from pain medication  He complains of intermittent posterior chest wall pain.  No nausea vomiting. Patient states his appetite is fair.  His weight is stable.  Review of Systems  Constitutional:  Positive for malaise/fatigue and weight loss. Negative for chills, diaphoresis and fever.  HENT:  Negative for nosebleeds and sore throat.    Eyes:  Negative for double vision.  Respiratory:  Negative for cough, hemoptysis, sputum production, shortness of breath and wheezing.   Cardiovascular:  Negative for chest pain, palpitations, orthopnea and leg swelling.  Gastrointestinal:  Negative for abdominal pain, blood in stool, constipation, diarrhea, heartburn, melena, nausea and vomiting.  Genitourinary:  Negative for dysuria, frequency and urgency.  Musculoskeletal:  Positive for back pain and joint pain.  Skin: Negative.  Negative for itching and rash.  Neurological:  Positive for weakness. Negative for dizziness, tingling, focal weakness and headaches.  Endo/Heme/Allergies:  Does not bruise/bleed easily.  Psychiatric/Behavioral:  Negative for depression. The patient is not nervous/anxious and does not have insomnia.     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 Leonie Man)   CATARACT EXTRACTION W/ INTRAOCULAR LENS IMPLANT Bilateral    DEXA  05/2011   spine 0.7, L femur 0.0, R femur -0.6, normal   ERCP N/A 05/26/2022   Procedure: ENDOSCOPIC RETROGRADE CHOLANGIOPANCREATOGRAPHY (ERCP);  Surgeon: Lucilla Lame, MD;  Location: [facility] ENDOSCOPY;  Service: Endoscopy;  Laterality: N/A;   KIDNEY STONE SURGERY     KIDNEY STONE SURGERY  06/2009   Stone removed from bladder   MELANOMA EXCISION Left 06/05/2019   Procedure: REEXCISION MELANOMA OF LEFT POSTERIOR AURICLE;  Surgeon: Johnathan Hausen, MD;  Location: [facility];  Service: General;  Laterality: Left;   PORTACATH PLACEMENT  08/05/11   right sided portacath   PROSTATECTOMY  04/06/07   TOE AMPUTATION Left 2017   2nd toe left foot    SOCIAL HISTORY: Social History   Socioeconomic History   Marital status: Single    Spouse name: Not on file   Number of children: Not on file   Years of education: Not on file   Highest education level: Not on file  Occupational History   Occupation: Sales    Comment: [employer] [place]  Tobacco Use   Smoking status: Former    Packs/day: 2.00    Years: 30.00    Total pack years: 60.00    Types: Cigarettes    Quit date: 07/27/1996    Years since quitting: 26.1   Smokeless tobacco: Never  Vaping Use   Vaping Use: Never used  Substance and Sexual Activity   Alcohol use: No   Drug use: No   Sexual activity: Never  Other Topics Concern   Not on file  Social History Narrative   [place]- closer to [place]; condos; [wife with stroke- better]. Quit smoking 1998; no alcohol.    Social Determinants of Health   Financial Resource Strain: Not on file  Food Insecurity: No Food Insecurity (05/26/2022)   Hunger Vital Sign    Worried About Running Out of Food in the Last Year: Never true    Ran Out of Food in the Last Year: Never true  Transportation Needs: No Transportation Needs (05/26/2022)   PRAPARE - Hydrologist (Medical): No    Lack of Transportation (Non-Medical): No  Physical Activity: Not on file  Stress: Not on file  Social Connections: Not on file  Intimate Partner Violence: Not At Risk (05/26/2022)   Humiliation, Afraid, Rape, and Kick  questionnaire    Fear of Current or Ex-Partner: No    Emotionally Abused: No    Physically Abused: No    Sexually Abused: No    FAMILY HISTORY: Family History  Problem Relation Age of Onset   Pneumonia Mother    Stroke Father    Cancer Brother        lung   Kidney cancer Sister    Colon cancer Neg Hx     ALLERGIES:  is allergic to celebrex [celecoxib], cymbalta [duloxetine hcl], hydromorphone hcl, meloxicam, oxycontin [oxycodone], percocet [oxycodone-acetaminophen], vicodin [hydrocodone-acetaminophen], and lyrica [pregabalin].  MEDICATIONS:  Current Outpatient Medications  Medication Sig Dispense Refill   acetaminophen (TYLENOL) 325 MG tablet Take 2 tablets (650 mg total) by mouth every 6 (six) hours as needed for mild pain (or Fever >/= 101).     B Complex Vitamins (VITAMIN-B COMPLEX) TABS Take 1  tablet by mouth daily.     Cyanocobalamin (VITAMIN B 12 PO) Take 1,000 mg by mouth daily.     fentaNYL (DURAGESIC) 75 MCG/HR Place 1 patch onto the skin every other day.     folic acid (FOLVITE) 1 MG tablet Take 1 tablet (1 mg total) by mouth daily. 30 tablet 4   lidocaine-prilocaine (EMLA) cream Apply topically as needed. Apply to port site one hour before treatment and cover with plastic wrap 30 g 2   NONFORMULARY OR COMPOUNDED ITEM Baclofn/amitrp/ketamn cream topically to feet TID prn for neuropathy     OLANZapine (ZYPREXA) 5 MG tablet Take 1 tablet (5 mg total) by mouth at bedtime. 90 tablet 1   ondansetron (ZOFRAN) 4 MG tablet Take 4 mg by mouth every 8 (eight) hours as needed.     Oxycodone HCl 10 MG TABS Take 1-2 tablets by mouth 3 (three) times daily as needed.     pantoprazole (PROTONIX) 40 MG tablet Take 1 tablet (40 mg total) by mouth daily. 90 tablet 3   potassium chloride SA (KLOR-CON M) 20 MEQ tablet 1 pill twice a day 60 tablet 3   predniSONE (DELTASONE) 10 MG tablet Take 1 pill [10 mg] once a day x 2 week; and then 1/2 pill [5mg ] a day- do not STOP until directed. Take  with food in AM. 30 tablet 0   prochlorperazine (COMPAZINE) 10 MG tablet Take 1 tablet (10 mg total) by mouth every 6 (six) hours as needed for nausea or vomiting. 30 tablet 0   enzalutamide (XTANDI) 40 MG tablet Take 2 tablets (80 mg total) by mouth daily. (Patient not taking: Reported on 06/30/2022) 60 tablet 0   Loperamide HCl (IMODIUM PO) Take by mouth daily as needed. (Patient not taking: Reported on 09/04/2022)     No current facility-administered medications for this visit.    PHYSICAL EXAMINATION:  Vitals:   09/04/22 0942  BP: 121/77  Pulse: 93  Resp: 16  Temp: 97.6 F (36.4 C)  SpO2: 99%    Filed Weights   09/04/22 0942  Weight: 145 lb (65.8 kg)     Physical Exam Vitals and nursing note reviewed.  HENT:     Head: Normocephalic and atraumatic.     Mouth/Throat:     Pharynx: Oropharynx is clear.  Eyes:     Extraocular Movements: Extraocular movements intact.     Pupils: Pupils are equal, round, and reactive to light.  Cardiovascular:     Rate and Rhythm: Normal rate and regular rhythm.  Pulmonary:     Comments: Decreased breath sounds bilaterally.  Abdominal:     Palpations: Abdomen is soft.  Musculoskeletal:        General: Normal range of motion.     Cervical back: Normal range of motion.  Skin:    General: Skin is warm.  Neurological:     General: No focal deficit present.     Mental Status: He is alert and oriented to person, place, and time.  Psychiatric:        Behavior: Behavior normal.        Judgment: Judgment normal.    LABORATORY DATA:  I have reviewed the data as listed Lab Results  Component Value Date   WBC 7.1 09/04/2022   HGB 13.2 09/04/2022   HCT 39.3 09/04/2022   MCV 102.6 (H) 09/04/2022   PLT 180 09/04/2022   Recent Labs    06/30/22 0958 08/04/22 1225 09/04/22 0912  NA 137 137 136  K 3.4* 3.7 3.2*  CL 106 101 99  CO2 22 27 28   GLUCOSE 121* 146* 135*  BUN 14 21 14   CREATININE 1.05 0.97 1.01  CALCIUM 8.6* 8.2* 8.4*   GFRNONAA >60 >60 >60  PROT 7.6 6.0* 6.6  ALBUMIN 3.4* 3.3* 3.3*  AST 43* 26 26  ALT 21 25 11   ALKPHOS 49 64 49  BILITOT 0.9 0.7 0.4    RADIOGRAPHIC STUDIES: I have personally reviewed the radiological images as listed and agreed with the findings in the report. No results found.   # May 2023- Stage IV (T1c, N2, M a) non-small cell lung cancer, adenocarcinoma presented with left lingular nodule in addition to pleural nodularity and thickening throughout the left hemothorax as well as malignant left pleural effusion and pericardiac lymphadenopathy diagnosed in  Molecular studies by foundation 1 showed no actionable mutations.  Extreme poor tolerance to status post cycle number 2 of carbo Alimta [facility] 26th, 2023]-hospital with sepsis. PDL-1 status-1%.  August 2023 CT scan-chest stable.  Currently on single agent Keytruda.  #Proceed with Keytruda cycle #3 today Labs today reviewed;  acceptable for treatment today. SEP 2023- TSH- WNL.  # Diarrhea- G-1- recommend immoidum prn. Monitor closely on immunotherapy.   # Hypokalemia- 3.2; recommd Kdur BID.   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 ELigard q 6 M; give today. Understand palliative nature of the treatment.    # Hx of Multiple Myeloma [2012]-  Remission- monitor for now.   # Pain- chronic- none oxycodone [constipation]; fentanyl patch -75 mcg- STABLE.   # IV ACCESS: port flush-   # DISPOSITION: # Eligard today # Keytruda today # follow up in 3 weeks- MD; labs- cbc/cmp; TSH; Keytruda- Dr.B      Cammie Sickle, MD 09/04/2022 11:56 AM

## 2022-09-04 NOTE — Progress Notes (Signed)
Pt in for follow up, reports having right lower back pain.  Pt reports improved appetite.  Denies any concerns today.

## 2022-09-04 NOTE — Addendum Note (Signed)
Addended by: Sofie Rower A on: 09/04/2022 10:18 AM   Modules accepted: Orders

## 2022-09-04 NOTE — Assessment & Plan Note (Addendum)
#   May 2023- Stage IV (T1c, N2, M a) non-small cell lung cancer, adenocarcinoma presented with left lingular nodule in addition to pleural nodularity and thickening throughout the left hemothorax as well as malignant left pleural effusion and pericardiac lymphadenopathy diagnosed in  Molecular studies by foundation 1 showed no actionable mutations.  Extreme poor tolerance to status post cycle number 2 of carbo Alimta [facility] 26th, 2023]-hospital with sepsis. PDL-1 status-1%.   Status post Keytruda #3-CT scan chest 28th, NOV 2023-  The left upper lobe mass appears slightly smaller than on the prior studies. Small loculated left pleural effusion with thickened pleural margins, previously noted to be metastatic on the PET-CT. Smaller effusion than previously;  Adjacent left lower lobe consolidation is less conspicuous today but still present; Stable 7 mm irregular nodule in the base of the right upper lobe.  November 2023-bone scan negative for any metastatic disease.   # Continue to hold Keytruda.  Repeat imaging again in 1 month.  Today.  # Diarrhea-patient currently on prednisone 5 mg/ day;recommend taking 5 mg every other day. And then STOP. Stable.  # Constipation sec to Oxycodone- on miralax. Stable.  # Epigastric pain/radiating to back- intermittent- ? Gastritis- recommend OTC Tums/ prilosec once a day.   # Weight loss/loss of appetite- sec to malignancy-  recommend Zyprexa.   # Hypokalemia- 3.2; recommend compliance with Kdur BID.   #Recurrent castrate resistant prostate cancer- [Dr.Bordern Urology]  s/p ELigard q 6 M; HOLD off X-tandi-given absence of overt metastatic disease noted on the bone scan. Monitor for now.   # Hx of Multiple Myeloma [2012]-  Remission- monitor for now. Stable.  # Pain- chronic- none oxycodone [constipation]; fentanyl patch -75 mcg-  Stable.  # IV ACCESS: port- functional.   Eligard q6 M-last- 05/06/2022.   # DISPOSITION: # de-access # follow up on 1  month- MD;  port/labs- cbc/cmp; MM panel; K/L light chains- PSA; CT chest prior- Dr.B

## 2022-09-07 ENCOUNTER — Other Ambulatory Visit: Payer: Self-pay | Admitting: *Deleted

## 2022-09-07 MED ORDER — OLANZAPINE 5 MG PO TABS: 5.0000 mg | ORAL_TABLET | Freq: Every day | ORAL | 1 refills | Status: DC

## 2022-09-07 NOTE — Telephone Encounter (Signed)
Received call from pt's caregiver, [name], that prescription for zyprexa was not received by the pharmacy. Informed [name] that will send prescription again electronically to [facility] pharmacy. [name] verbalized understanding.

## 2022-09-16 ENCOUNTER — Ambulatory Visit (INDEPENDENT_AMBULATORY_CARE_PROVIDER_SITE_OTHER): Payer: Medicare Other | Admitting: Podiatry

## 2022-09-16 ENCOUNTER — Encounter: Payer: Self-pay | Admitting: Podiatry

## 2022-09-16 DIAGNOSIS — M79674 Pain in right toe(s): Secondary | ICD-10-CM

## 2022-09-16 DIAGNOSIS — M79676 Pain in unspecified toe(s): Secondary | ICD-10-CM

## 2022-09-16 DIAGNOSIS — B351 Tinea unguium: Secondary | ICD-10-CM | POA: Diagnosis not present

## 2022-09-16 MED ORDER — MUPIROCIN 2 % EX OINT: 1.0000 | TOPICAL_OINTMENT | Freq: Two times a day (BID) | CUTANEOUS | 0 refills | Status: DC

## 2022-09-16 MED ORDER — DOXYCYCLINE HYCLATE 100 MG PO TABS: 100.0000 mg | ORAL_TABLET | Freq: Two times a day (BID) | ORAL | 0 refills | Status: DC

## 2022-09-16 NOTE — Progress Notes (Signed)
He presents today after having not seen him since last April.  He states that he has noticed his toe has started to turn red and his health aide assistant states that she has been applying Neosporin to it on a regular basis.  She states that he does not wear shoes at home that he has barefooted 90% of the time.  Objective: Vital signs are stable alert and oriented x 3 pulses remain palpable bilateral.  He does have erythema overlying the dorsal medial aspect of the hallux interphalangeal joint with a very small abrasion but does not appear to be open at this time and does not appear to be open deep at all.  There is no purulence no malodor I do not see any thing through the wound.  That the skin is very thin and it would not take a lot for this to breakdown.  I imagine he does have distal disease very much like he did in his left foot.  This started out the same way on that left foot.  Currently he is a transmet amputation on the left side.  Assessment: Pain in limb secondary to onychomycosis and very early preulcerative lesion hallux interphalangeal joint left.  Plan: Debrided nails right foot 1 through 5.  And placed a silicone pad over his left great toe.  Will follow-up with him with any changes we did prescribe him Augmentin and Bactroban ointment and I will follow-up with him in about 3 weeks just for check.

## 2022-10-01 ENCOUNTER — Ambulatory Visit
Admission: RE | Admit: 2022-10-01 | Discharge: 2022-10-01 | Disposition: A | Discharge status: Home / Self Care | Payer: Medicare Other | Source: Ambulatory Visit | Attending: Internal Medicine | Admitting: Internal Medicine

## 2022-10-01 DIAGNOSIS — C3412 Malignant neoplasm of upper lobe, left bronchus or lung: Secondary | ICD-10-CM | POA: Insufficient documentation

## 2022-10-02 ENCOUNTER — Ambulatory Visit: Payer: Medicare Other | Admitting: Internal Medicine

## 2022-10-02 ENCOUNTER — Other Ambulatory Visit: Payer: Medicare Other

## 2022-10-09 ENCOUNTER — Encounter: Payer: Self-pay | Admitting: Internal Medicine

## 2022-10-09 ENCOUNTER — Inpatient Hospital Stay (HOSPITAL_BASED_OUTPATIENT_CLINIC_OR_DEPARTMENT_OTHER): Payer: Medicare Other | Admitting: Internal Medicine

## 2022-10-09 ENCOUNTER — Ambulatory Visit: Payer: Medicare Other

## 2022-10-09 ENCOUNTER — Inpatient Hospital Stay: Payer: Medicare Other | Attending: Hematology and Oncology

## 2022-10-09 VITALS — BP 108/75 | HR 89 | Temp 96.2°F | Resp 18 | Wt 145.6 lb

## 2022-10-09 DIAGNOSIS — Z87891 Personal history of nicotine dependence: Secondary | ICD-10-CM | POA: Diagnosis not present

## 2022-10-09 DIAGNOSIS — C61 Malignant neoplasm of prostate: Secondary | ICD-10-CM | POA: Diagnosis not present

## 2022-10-09 DIAGNOSIS — C9001 Multiple myeloma in remission: Secondary | ICD-10-CM | POA: Insufficient documentation

## 2022-10-09 DIAGNOSIS — E876 Hypokalemia: Secondary | ICD-10-CM | POA: Insufficient documentation

## 2022-10-09 DIAGNOSIS — C3412 Malignant neoplasm of upper lobe, left bronchus or lung: Secondary | ICD-10-CM | POA: Diagnosis present

## 2022-10-09 DIAGNOSIS — C9002 Multiple myeloma in relapse: Secondary | ICD-10-CM

## 2022-10-09 LAB — COMPREHENSIVE METABOLIC PANEL
ALT: 19 U/L (ref 0–44)
AST: 26 U/L (ref 15–41)
Albumin: 3.5 g/dL (ref 3.5–5.0)
Alkaline Phosphatase: 58 U/L (ref 38–126)
Anion gap: 8 (ref 5–15)
BUN: 17 mg/dL (ref 8–23)
CO2: 26 mmol/L (ref 22–32)
Calcium: 8.8 mg/dL — ABNORMAL LOW (ref 8.9–10.3)
Chloride: 102 mmol/L (ref 98–111)
Creatinine, Ser: 1.09 mg/dL (ref 0.61–1.24)
GFR, Estimated: >60 mL/min (ref >60)
Glucose, Bld: 119 mg/dL — ABNORMAL HIGH (ref 70–99)
Potassium: 3.3 mmol/L — ABNORMAL LOW (ref 3.5–5.1)
Sodium: 136 mmol/L (ref 135–145)
Total Bilirubin: 0.6 mg/dL (ref 0.3–1.2)
Total Protein: 7.1 g/dL (ref 6.5–8.1)

## 2022-10-09 LAB — CBC WITH DIFFERENTIAL/PLATELET
Abs Immature Granulocytes: 0.06 10*3/uL (ref 0.00–0.07)
Basophils Absolute: 0 10*3/uL (ref 0.0–0.1)
Basophils Relative: 0 %
Eosinophils Absolute: 0.1 10*3/uL (ref 0.0–0.5)
Eosinophils Relative: 1 %
HCT: 40.6 % (ref 39.0–52.0)
Hemoglobin: 13.4 g/dL (ref 13.0–17.0)
Immature Granulocytes: 1 %
Lymphocytes Relative: 10 %
Lymphs Abs: 0.8 10*3/uL (ref 0.7–4.0)
MCH: 33.6 pg (ref 26.0–34.0)
MCHC: 33 g/dL (ref 30.0–36.0)
MCV: 101.8 fL — ABNORMAL HIGH (ref 80.0–100.0)
Monocytes Absolute: 0.9 10*3/uL (ref 0.1–1.0)
Monocytes Relative: 12 %
Neutro Abs: 5.7 10*3/uL (ref 1.7–7.7)
Neutrophils Relative %: 76 %
Platelets: 212 10*3/uL (ref 150–400)
RBC: 3.99 MIL/uL — ABNORMAL LOW (ref 4.22–5.81)
RDW: 12.4 % (ref 11.5–15.5)
WBC: 7.5 10*3/uL (ref 4.0–10.5)
nRBC: 0 % (ref 0.0–0.2)

## 2022-10-09 LAB — PSA: Prostatic Specific Antigen: 58.21 ng/mL — ABNORMAL HIGH (ref 0.00–4.00)

## 2022-10-09 NOTE — Progress Notes (Signed)
[facility] NOTE  Patient Care Team: Jinny Sanders, MD as PCP - General (Family Medicine) Jeanann Lewandowsky, MD as Consulting Physician (Hematology and Oncology) Cathlean Marseilles, NP as Nurse Practitioner (Hematology and Oncology) Raynelle Bring, MD as Consulting Physician (Urology) Garrel Ridgel, [place] as Consulting Physician (Podiatry) Morton Stall, Howell Rucks, NP as Nurse Practitioner (Pain Medicine) Druscilla Brownie, MD as Consulting Physician (Dermatology) Bryson Ha, OD as Consulting Physician (Optometry) Nicholas Lose, MD as Consulting Physician (Hematology and Oncology) Telford Nab, RN as Oncology Nurse Navigator  CHIEF COMPLAINTS/PURPOSE OF CONSULTATION:  Lung cancer  Oncology History Overview Note  Adenosquamous lung cancer  # MULTIPLE MYELOMA [Dr.Gorsuch]; s/p Auto-Transplant [[facility]]- remission.   # MAY 2023-   # The patient is currently undergoing systemic chemotherapy with carboplatin for AUC of 5, Alimta 500 Mg/M2 and Keytruda 200 Mg IV every 3 weeks status post 1 cycle started on January 29, 2022. [Dr.Mohammed; [place]]; single agent keytruda- x3 cycles; stopped fall- 2023- sec to colitis  # march 2024- start carbo AUC- 4; -alimta   #Prostate cancer prostatectomy 2003; multiple myeloma- 2012-remission   Malignant neoplasm of prostate ([place])  02/23/2007 Initial Diagnosis   Malignant neoplasm of prostate ([place])   10/28/2020 Cancer Staging   Staging form: Prostate, AJCC 6th Edition - Clinical: Stage III (T3, N0, M0) - Signed by Heath Lark, MD on 10/28/2020   11/24/2021 Procedure   Successful ultrasound guided diagnostic and therapeutic left thoracentesis yielding 750 cc of pleural fluid.     12/02/2021 PET scan   1. Hypermetabolic nodular areas of consolidation in the left lung with a hypermetabolic malignant left pleural effusion. Hypermetabolism extends inferiorly along the left hemidiaphragm. Associated hypermetabolic prepericardiac lymph nodes.  Findings may be metastatic. Primary bronchogenic carcinoma cannot be excluded. 2. Uptake in the soft tissues of the distal left foot with associated edema in the soft tissues. Please correlate clinically. 3. Cholelithiasis and choledocholithiasis. No biliary ductal dilatation. 4. Bilateral renal stones. 5. Aortic atherosclerosis (ICD10-I70.0). Coronary artery calcification.     12/18/2021 Procedure   Successful CT guided core needle core biopsy of LEFT lung mass, as above   Multiple myeloma in relapse ([place])  07/09/2011 Initial Diagnosis   Multiple myeloma in relapse ([place])   01/28/2022 - 02/19/2022 Chemotherapy   Patient is on Treatment Plan : LUNG Carboplatin (5) + Pemetrexed (500) + Pembrolizumab (200) D1 q21d Induction x 4 cycles / Maintenance Pemetrexed (500) + Pembrolizumab (200) D1 q21d     01/28/2022 - 06/09/2022 Chemotherapy   Patient is on Treatment Plan : Pembrolizumab (200) D1 q21d     10/10/2022 -  Chemotherapy   Patient is on Treatment Plan : carbo-Alimta every 3 weeks- followed by Maintenace alimta     Lung cancer ([place])  12/02/2021 PET scan   1. Hypermetabolic nodular areas of consolidation in the left lung with a hypermetabolic malignant left pleural effusion. Hypermetabolism extends inferiorly along the left hemidiaphragm. Associated hypermetabolic prepericardiac lymph nodes. Findings may be metastatic. Primary bronchogenic carcinoma cannot be excluded. 2. Uptake in the soft tissues of the distal left foot with associated edema in the soft tissues. Please correlate clinically. 3. Cholelithiasis and choledocholithiasis. No biliary ductal dilatation. 4. Bilateral renal stones. 5. Aortic atherosclerosis (ICD10-I70.0). Coronary artery calcification.   12/18/2021 Pathology Results   A. LUNG MASS, LEFT, BIOPSY:  -  Adenocarcinoma  -  See comment  Six immunohistochemical stains were performed with adequate control. The adenocarcinoma is diffusely and strongly positive for  cytokeratin  7 and the pulmonary adeno marker TTF-1.  The tumor is negative for the pulmonary adeno marker Napsin A.  The tumor does show a patchy focal positivity for the squamous marker p40 suggesting an adenosquamous differentiation to this tumor.  The tumor is negative for the prostate markers prostate-specific antigen and prostein.    12/23/2021 Initial Diagnosis   Lung cancer ([place])   12/23/2021 Cancer Staging   Staging form: Lung, AJCC 8th Edition - Clinical stage from 12/23/2021: Stage IVA (cT1c, cN2, cM1a) - Signed by Curt Bears, MD on 01/05/2022 Stage prefix: Initial diagnosis   01/01/2022 Imaging   MRI brain Negative for metastatic disease.   01/28/2022 - 02/19/2022 Chemotherapy   Patient is on Treatment Plan : LUNG Carboplatin (5) + Pemetrexed (500) + Pembrolizumab (200) D1 q21d Induction x 4 cycles / Maintenance Pemetrexed (500) + Pembrolizumab (200) D1 q21d     01/28/2022 - 06/09/2022 Chemotherapy   Patient is on Treatment Plan : Pembrolizumab (200) D1 q21d     10/10/2022 -  Chemotherapy   Patient is on Treatment Plan : carbo-Alimta every 3 weeks- followed by Maintenace alimta     Malignant neoplasm of upper lobe of left lung ([place])  01/28/2022 - 06/09/2022 Chemotherapy   Patient is on Treatment Plan : Pembrolizumab (200) D1 q21d     03/17/2022 Initial Diagnosis   Malignant neoplasm of upper lobe of left lung ([place])   03/17/2022 Cancer Staging   Staging form: Lung, AJCC 8th Edition - Clinical: Stage IVB (cT2, cN2, cM1c) - Signed by Cammie Sickle, MD on 03/17/2022   10/10/2022 -  Chemotherapy   Patient is on Treatment Plan : carbo-Alimta every 3 weeks- followed by Maintenace alimta       HISTORY OF PRESENTING ILLNESS: Patient ambulating- with assistance.  Accompanied by friend.  [name] 77 y.o.  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   Patient received 3 cycles of single agent Keytruda-currently on hold because of diarrhea.  Patient finally finished prednisone taper approximately 2 weeks ago.  Diarrhea is improved.  Intermittent constipation from pain medication.  However more recently patient noted to have  increasing mid left side rib pain. Reports taking Potassium 20 Meq QD.  Patient denies any nausea vomiting. Patient states his appetite is fair.  His weight is stable.    Review of Systems  Constitutional:  Positive for malaise/fatigue and weight loss. Negative for chills, diaphoresis and fever.  HENT:  Negative for nosebleeds and sore throat.   Eyes:  Negative for double vision.  Respiratory:  Positive for shortness of breath. Negative for cough, hemoptysis, sputum production and wheezing.   Cardiovascular:  Positive for chest pain. Negative for palpitations, orthopnea and leg swelling.  Gastrointestinal:  Positive for constipation. Negative for abdominal pain, blood in stool, diarrhea, heartburn, melena, nausea and vomiting.  Genitourinary:  Negative for dysuria, frequency and urgency.  Musculoskeletal:  Positive for back pain and joint pain.  Skin: Negative.  Negative for itching and rash.  Neurological:  Negative for dizziness, tingling, focal weakness, weakness and headaches.  Endo/Heme/Allergies:  Does not bruise/bleed easily.  Psychiatric/Behavioral:  Negative for depression. The patient is not nervous/anxious and does not have insomnia.     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) 2008   s/p prostatectomy   Prostate cancer ([place]) 03/2002   Renal insufficiency     SURGICAL HISTORY: Past Surgical History:  Procedure Laterality Date   BASAL CELL CARCINOMA EXCISION Left 10/22/2020   [facility] (Dr. Leonie Man)   CATARACT EXTRACTION W/ INTRAOCULAR LENS IMPLANT Bilateral    DEXA  05/2011   spine 0.7, L femur 0.0, R femur -0.6, normal   ERCP N/A 05/26/2022   Procedure: ENDOSCOPIC RETROGRADE CHOLANGIOPANCREATOGRAPHY (ERCP);  Surgeon: Lucilla Lame, MD;  Location: [facility] ENDOSCOPY;  Service: Endoscopy;  Laterality: N/A;   KIDNEY STONE SURGERY     KIDNEY STONE SURGERY  06/2009   Stone removed from bladder   MELANOMA EXCISION Left 06/05/2019   Procedure: REEXCISION MELANOMA OF LEFT POSTERIOR AURICLE;  Surgeon: Johnathan Hausen, MD;  Location: [facility];  Service: General;  Laterality: Left;   PORTACATH PLACEMENT  08/05/11   right sided portacath   PROSTATECTOMY  04/06/07   TOE AMPUTATION Left 2017   2nd toe left foot    SOCIAL HISTORY: Social History   Socioeconomic History   Marital status: Single    Spouse name: Not on file   Number of children: Not on file   Years of education: Not on file   Highest education level: Not on file  Occupational History   Occupation: Sales    Comment: [employer] [place]  Tobacco Use   Smoking status: Former    Packs/day: 2.00    Years: 30.00    Additional pack years: 0.00    Total pack years: 60.00    Types: Cigarettes    Quit date: 07/27/1996    Years since quitting: 26.2   Smokeless tobacco: Never  Vaping Use   Vaping Use: Never used  Substance and Sexual Activity   Alcohol use: No   Drug use: No    Sexual activity: Never  Other Topics Concern   Not on file  Social History Narrative   [place]- closer to [place]; condos; [wife with stroke- better]. Quit smoking 1998; no alcohol.    Social Determinants of Health   Financial Resource Strain: Not on file  Food Insecurity: No Food Insecurity (05/26/2022)   Hunger Vital Sign    Worried About Running Out of Food in the Last Year: Never true    Ran Out of Food in the Last Year: Never true  Transportation Needs: No Transportation Needs (05/26/2022)   PRAPARE - Hydrologist (Medical): No    Lack of Transportation (Non-Medical): No  Physical Activity: Not on file  Stress: Not on file  Social Connections: Not on file  Intimate Partner Violence: Not At Risk (05/26/2022)   Humiliation, Afraid, Rape, and Kick questionnaire    Fear of Current or Ex-Partner: No    Emotionally Abused: No  Physically Abused: No    Sexually Abused: No    FAMILY HISTORY: Family History  Problem Relation Age of Onset   Pneumonia Mother    Stroke Father    Cancer Brother        lung   Kidney cancer Sister    Colon cancer Neg Hx     ALLERGIES:  is allergic to celebrex [celecoxib], cymbalta [duloxetine hcl], hydromorphone hcl, meloxicam, oxycontin [oxycodone], percocet [oxycodone-acetaminophen], vicodin [hydrocodone-acetaminophen], and lyrica [pregabalin].  MEDICATIONS:  Current Outpatient Medications  Medication Sig Dispense Refill   acetaminophen (TYLENOL) 325 MG tablet Take 2 tablets (650 mg total) by mouth every 6 (six) hours as needed for mild pain (or Fever >/= 101).     B Complex Vitamins (VITAMIN-B COMPLEX) TABS Take 1 tablet by mouth daily.     Cyanocobalamin (VITAMIN B 12 PO) Take 1,000 mg by mouth daily.     doxycycline (VIBRA-TABS) 100 MG tablet Take 1 tablet (100 mg total) by mouth 2 (two) times daily. 20 tablet 0   fentaNYL (DURAGESIC) 75 MCG/HR Place 1 patch onto the skin every other day.     folic acid  (FOLVITE) 1 MG tablet Take 1 tablet (1 mg total) by mouth daily. 30 tablet 4   lidocaine-prilocaine (EMLA) cream Apply topically as needed. Apply to port site one hour before treatment and cover with plastic wrap 30 g 2   mupirocin ointment (BACTROBAN) 2 % Apply 1 Application topically 2 (two) times daily. 22 g 0   NONFORMULARY OR COMPOUNDED ITEM Baclofn/amitrp/ketamn cream topically to feet TID prn for neuropathy     OLANZapine (ZYPREXA) 5 MG tablet Take 1 tablet (5 mg total) by mouth at bedtime. 90 tablet 1   ondansetron (ZOFRAN) 4 MG tablet Take 4 mg by mouth every 8 (eight) hours as needed.     Oxycodone HCl 10 MG TABS Take 1-2 tablets by mouth 3 (three) times daily as needed.     pantoprazole (PROTONIX) 40 MG tablet Take 1 tablet (40 mg total) by mouth daily. 90 tablet 3   potassium chloride SA (KLOR-CON M) 20 MEQ tablet 1 pill twice a day (Patient taking differently: daily. 1 pill twice a day) 60 tablet 3   prochlorperazine (COMPAZINE) 10 MG tablet Take 1 tablet (10 mg total) by mouth every 6 (six) hours as needed for nausea or vomiting. 30 tablet 0   enzalutamide (XTANDI) 40 MG tablet Take 2 tablets (80 mg total) by mouth daily. (Patient not taking: Reported on 06/30/2022) 60 tablet 0   Loperamide HCl (IMODIUM PO) Take by mouth daily as needed. (Patient not taking: Reported on 09/04/2022)     predniSONE (DELTASONE) 10 MG tablet Take 1 pill [10 mg] once a day x 2 week; and then 1/2 pill [5mg ] a day- do not STOP until directed. Take with food in AM. (Patient not taking: Reported on 10/09/2022) 30 tablet 0   No current facility-administered medications for this visit.    PHYSICAL EXAMINATION:   Vitals:   10/09/22 0900  BP: 108/75  Pulse: 89  Resp: 18  Temp: (!) 96.2 F (35.7 C)  SpO2: 97%   Filed Weights   10/09/22 0900  Weight: 145 lb 9.6 oz (66 kg)    Physical Exam Vitals and nursing note reviewed.  HENT:     Head: Normocephalic and atraumatic.     Mouth/Throat:     Pharynx:  Oropharynx is clear.  Eyes:     Extraocular Movements: Extraocular movements intact.  Pupils: Pupils are equal, round, and reactive to light.  Cardiovascular:     Rate and Rhythm: Normal rate and regular rhythm.  Pulmonary:     Comments: Decreased breath sounds bilaterally.  Abdominal:     Palpations: Abdomen is soft.  Musculoskeletal:        General: Normal range of motion.     Cervical back: Normal range of motion.  Skin:    General: Skin is warm.  Neurological:     General: No focal deficit present.     Mental Status: He is alert and oriented to person, place, and time.  Psychiatric:        Behavior: Behavior normal.        Judgment: Judgment normal.     LABORATORY DATA:  I have reviewed the data as listed Lab Results  Component Value Date   WBC 7.5 10/09/2022   HGB 13.4 10/09/2022   HCT 40.6 10/09/2022   MCV 101.8 (H) 10/09/2022   PLT 212 10/09/2022   Recent Labs    08/04/22 1225 09/04/22 0912 10/09/22 0925  NA 137 136 136  K 3.7 3.2* 3.3*  CL 101 99 102  CO2 27 28 26   GLUCOSE 146* 135* 119*  BUN 21 14 17   CREATININE 0.97 1.01 1.09  CALCIUM 8.2* 8.4* 8.8*  GFRNONAA >60 >60 >60  PROT 6.0* 6.6 7.1  ALBUMIN 3.3* 3.3* 3.5  AST 26 26 26   ALT 25 11 19   ALKPHOS 64 49 58  BILITOT 0.7 0.4 0.6    RADIOGRAPHIC STUDIES: I have personally reviewed the radiological images as listed and agreed with the findings in the report. CT CHEST WO CONTRAST  Result Date: 10/02/2022 CLINICAL DATA:  Left upper lobe lung cancer, non-small-cell. On systemic therapy EXAM: CT CHEST WITHOUT CONTRAST TECHNIQUE: Multidetector CT imaging of the chest was performed following the standard protocol without IV contrast. RADIATION DOSE REDUCTION: This exam was performed according to the departmental dose-optimization program which includes automated exposure control, adjustment of the mA and/or kV according to patient size and/or use of iterative reconstruction technique. COMPARISON:  CT  06/15/2022 and older.  Bone scan 06/15/2022 FINDINGS: Cardiovascular: Left upper chest port. Trace pericardial fluid. The heart is nonenlarged. Coronary artery calcifications are seen. On this non IV contrast exam the thoracic aorta has a normal course and caliber with scattered calcified plaque. There is some enlargement of the main pulmonary arteries. Please correlate for evidence of pulmonary artery hypertension. Mediastinum/Nodes: No specific abnormal lymph node enlargement identified on this noncontrast examination in the axillary regions, hilum or mediastinum. Small thyroid gland. There is some debris in the trachea. Normal caliber thoracic esophagus. There is a new soft tissue nodule seen along the left side of the pericardium on series 2, image 112 measuring 2.2 by 1.3 cm. Previously there was only a small nodule measuring 7 mm. This could be an abnormal node versus a separate soft tissue mass. Additional nodule seen anterior to the pericardium on the left side on series 2, image 115 measuring 9 mm. Lungs/Pleura: Lungs demonstrates some apical subpleural paraseptal changes and scarring/fibrotic change. Additional areas of fibrosis along the right lung base. The right lung is without consolidation, pneumothorax or effusion. Right lung spiculated nodule seen once again. On the prior 7 mm in maximal dimension and today on series 3, image 74 8 mm in the right upper lobe. Few other punctate nodules identified. Example measures 2-3 mm in the middle lobe on series 3, image 110 this may be  along the bronchus. No new right-sided dominant lung nodule. Small left lower lobe loculated pleural effusion. Extensive pleural thickening with some nodular areas once again identified. Focus along the medial left lung base on series 2 image 102 today measures 2.2 x 1.3 cm. Previously this same area would have measured 1.5 by 0.9 cm. There also nodular areas in the left lung itself. Example left upper lobe anteriorly which  previously had dimensions of 2.1 x 1.5 cm and today on series 2, image 61 2.1 by 1.5 cm which is similar. Once again there are areas of scarring atelectatic changes to the lung and fibrosis. Upper Abdomen: In the upper abdomen the adrenal glands are preserved. Bilateral renal stones. Gallstones. Musculoskeletal: Osteopenia. Degenerative changes are noted along the spine. Stable compression at multiple levels thoracic spine with areas of augmentation cement including at T12 and L2. There is stable moderate compression at T5. Focal nodular area of abnormal soft tissue noted along the chest wall anteriorly along left side at the level of the xiphoid process and abutting the costochondral cartilage at the sixth and seventh rib levels. This nodular tissue today measures 3.9 by 1.9 cm. In retrospect there is faint tissue in this location measuring 2.4 x 1.7 cm. Developing soft tissue metastasis is likely. IMPRESSION: Overall progression of disease. There is increasing soft tissue nodules along the margin of the pericardium as well as along the anterior left chest wall. Slight increasing areas of pleural nodularity along the left hemithorax. Stable loculated left small pleural effusion. Separate underlying chronic lung changes with scarring, fibrosis and emphysematous change. Aortic Atherosclerosis (ICD10-I70.0) and Emphysema (ICD10-J43.9). Electronically Signed   By: Jill Side M.D.   On: 10/02/2022 16:23     Malignant neoplasm of upper lobe of left lung [facility]) # May 2023- Stage IV (T1c, N2, M a)  adenocarcinoma [Dr.Mohammed]Molecular studies by foundation 1 showed no actionable mutations.  Extreme poor tolerance to status post cycle number 2 of carbo Alimta [facility] 26th, 2023]-hospital with sepsis. PDL-1 status-1%.  Currently status post Keytruda #3-On HOLD sec to colitis. However, March 8th, 2024- Overall progression of disease; There is increasing soft tissue nodules along the margin of thepericardium as  well as along the anterior left chest wall; Slight increasing areas of pleural nodularity along the left Hemithorax; Stable loculated left small pleural effusion. Discontinue Keytruda.-Given the progressive disease last Keytruda fall 2023.   # Recommend proceeding with Carbo-Alimta x 2-4 cycles [based on response/tolerance]. Growth factor- would be given as prophylaxis for chemotherapy-induced neutropenia to prevent febrile neutropenias.  As per patient preference we will plan to start on April 1.  # Diarrhea/colitis sec to [place]- off prednisone.  Stable.  Monitor for worsening on chemotherapy.  # Constipation sec to Oxycodone- on miralax. Stable...  # Weight loss/loss of appetite- sec to malignancy-stable on Zyprexa.   # Hypokalemia- 3.2; recommend compliance with Kdur BID.   #Recurrent castrate resistant prostate cancer- [Dr.[facility]]  s/p ELigard q 6 M; HOLD off X-tandi-given absence of overt metastatic disease noted on the bone scan. Monitor for now.  Plan Eligard next visit.  # Hx of Multiple Myeloma [2012]-  Remission- monitor for now.  Await labs.  Stable.  # Pain- chronic- none oxycodone [constipation]; fentanyl patch -75 mcg-worsening-secondary to progressive malignancy.  See above.  #Incidental findings on Imaging  CT , march 2024: Aortic Atherosclerosis  and Emphysema  I reviewed/discussed/counseled the patient.   # PROGNOSIS: I had a long discussion with the patient regarding overall  prognosis of metastatic lung cancer is poor.  Median survival about 1 year.  Discussed that if tolerates chemotherapy would recommend ongoing therapy-but still incurable.  However if he has tolerance to therapy/or progressive disease-would recommend hospice.  Patient interested in proceeding with treatment.  # IV ACCESS: port- functional.   Eligard q6 M-last- 05/06/2022.   # DISPOSITION: # follow up on April 1st- MD;  port/labs- cbc/cmp; carbo-alimta;Eligard ; d-2 udenyca- Dr.B  # 40  minutes face-to-face with the patient discussing the above plan of care; more than 50% of time spent on prognosis/ natural history; counseling and coordination.    Cammie Sickle, MD 10/09/2022 10:59 AM

## 2022-10-09 NOTE — Progress Notes (Signed)
Patient reports increasing mid left side rib pain  Reports taking Potassium 20 Meq QD not BID as listed.

## 2022-10-09 NOTE — Assessment & Plan Note (Addendum)
#   May 2023- Stage IV (T1c, N2, M a)  adenocarcinoma [Dr.Mohammed]Molecular studies by [facility] 1 showed no actionable mutations.  Extreme poor tolerance to status post cycle number 2 of carbo Alimta [facility] 26th, 2023]-hospital with sepsis. PDL-1 status-1%.  Currently status post Keytruda #3-On HOLD sec to colitis. However, March 8th, 2024- Overall progression of disease; There is increasing soft tissue nodules along the margin of thepericardium as well as along the anterior left chest wall; Slight increasing areas of pleural nodularity along the left Hemithorax; Stable loculated left small pleural effusion. Discontinue Keytruda.-Given the progressive disease last Keytruda fall 2023.   # Recommend proceeding with Carbo-Alimta x 2-4 cycles [based on response/tolerance]. Growth factor- would be given as prophylaxis for chemotherapy-induced neutropenia to prevent febrile neutropenias.  As per patient preference we will plan to start on April 1.  # Diarrhea/colitis sec to [place]- off prednisone.  Stable.  Monitor for worsening on chemotherapy.  # Constipation sec to Oxycodone- on miralax. Stable...  # Weight loss/loss of appetite- sec to malignancy-stable on Zyprexa.   # Hypokalemia- 3.2; recommend compliance with Kdur BID.   #Recurrent castrate resistant prostate cancer- [Dr.[facility]]  s/p ELigard q 6 M; HOLD off X-tandi-given absence of overt metastatic disease noted on the bone scan. Monitor for now.  Plan Eligard next visit.  # Hx of Multiple Myeloma [2012]-  Remission- monitor for now.  Await labs.  Stable.  # Pain- chronic- none oxycodone [constipation]; fentanyl patch -75 mcg-worsening-secondary to progressive malignancy.  See above.  #Incidental findings on Imaging  CT , march 2024: Aortic Atherosclerosis  and Emphysema  I reviewed/discussed/counseled the patient.   # PROGNOSIS: I had a long discussion with the patient regarding overall prognosis of metastatic lung cancer is  poor.  Median survival about 1 year.  Discussed that if tolerates chemotherapy would recommend ongoing therapy-but still incurable.  However if he has tolerance to therapy/or progressive disease-would recommend hospice.  Patient interested in proceeding with treatment.  # IV ACCESS: port- functional.   Eligard q6 M-last- 05/06/2022.   # DISPOSITION: # follow up on April 1st- MD;  port/labs- cbc/cmp; carbo-alimta;Eligard ; d-2 udenyca- Dr.B  # 40 minutes face-to-face with the patient discussing the above plan of care; more than 50% of time spent on prognosis/ natural history; counseling and coordination.

## 2022-10-12 LAB — KAPPA/LAMBDA LIGHT CHAINS
Kappa free light chain: 50.9 mg/L — ABNORMAL HIGH (ref 3.3–19.4)
Kappa, lambda light chain ratio: 1.67 — ABNORMAL HIGH (ref 0.26–1.65)
Lambda free light chains: 30.4 mg/L — ABNORMAL HIGH (ref 5.7–26.3)

## 2022-10-14 ENCOUNTER — Ambulatory Visit (INDEPENDENT_AMBULATORY_CARE_PROVIDER_SITE_OTHER): Payer: Medicare Other | Admitting: Podiatry

## 2022-10-14 ENCOUNTER — Encounter: Payer: Self-pay | Admitting: Podiatry

## 2022-10-14 DIAGNOSIS — L97511 Non-pressure chronic ulcer of other part of right foot limited to breakdown of skin: Secondary | ICD-10-CM | POA: Diagnosis not present

## 2022-10-14 DIAGNOSIS — B351 Tinea unguium: Secondary | ICD-10-CM

## 2022-10-14 DIAGNOSIS — M79676 Pain in unspecified toe(s): Secondary | ICD-10-CM

## 2022-10-14 LAB — MULTIPLE MYELOMA PANEL, SERUM
Albumin SerPl Elph-Mcnc: 3.3 g/dL (ref 2.9–4.4)
Albumin/Glob SerPl: 1.1 (ref 0.7–1.7)
Alpha 1: 0.3 g/dL (ref 0.0–0.4)
Alpha2 Glob SerPl Elph-Mcnc: 0.9 g/dL (ref 0.4–1.0)
B-Globulin SerPl Elph-Mcnc: 1 g/dL (ref 0.7–1.3)
Gamma Glob SerPl Elph-Mcnc: 1.1 g/dL (ref 0.4–1.8)
Globulin, Total: 3.2 g/dL (ref 2.2–3.9)
IgA: 160 mg/dL (ref 61–437)
IgG (Immunoglobin G), Serum: 1142 mg/dL (ref 603–1613)
IgM (Immunoglobulin M), Srm: 28 mg/dL (ref 15–143)
Total Protein ELP: 6.5 g/dL (ref 6.0–8.5)

## 2022-10-14 MED ORDER — GENTAMICIN SULFATE 0.1 % EX CREA: 1.0000 | TOPICAL_CREAM | Freq: Three times a day (TID) | CUTANEOUS | 1 refills | Status: DC

## 2022-10-14 NOTE — Progress Notes (Signed)
[name] presents today for follow-up of his ulceration hallux right.  Is essentially unchanged she says.  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  Objective: Vital signs are stable he is alert and oriented that he does look discolored and does not seem to be very talkative.  His ulcerative lesion on the hallux is essentially unchanged there is no erythema cellulitis or odor.  Assessment: Continue the use of the gentamicin ointment which was sent in today and dressed on a daily basis.  Follow-up with him in 2 to 3 weeks

## 2022-10-20 ENCOUNTER — Other Ambulatory Visit: Payer: Medicare Other

## 2022-10-22 ENCOUNTER — Telehealth: Payer: Self-pay | Admitting: Family Medicine

## 2022-10-22 NOTE — Telephone Encounter (Signed)
Contacted [name] to schedule their annual wellness visit. Patient declined to schedule AWV at this time. Will call back to schedule AWV after chemo.   [place] Direct Dial: [phone]

## 2022-10-23 MED FILL — Dexamethasone Sodium Phosphate Inj 100 MG/10ML: INTRAMUSCULAR | Qty: 1 | Status: AC

## 2022-10-23 MED FILL — Fosaprepitant Dimeglumine For IV Infusion 150 MG (Base Eq): INTRAVENOUS | Qty: 5 | Status: AC

## 2022-10-25 NOTE — Progress Notes (Unsigned)
[place] NOTE  Patient Care Team: Jinny Sanders, MD as PCP - General (Family Medicine) Jeanann Lewandowsky, MD as Consulting Physician (Hematology and Oncology) Cathlean Marseilles, NP as Nurse Practitioner (Hematology and Oncology) Raynelle Bring, MD as Consulting Physician (Urology) Garrel Ridgel, [place] as Consulting Physician (Podiatry) Morton Stall, Howell Rucks, NP as Nurse Practitioner (Pain Medicine) Druscilla Brownie, MD as Consulting Physician (Dermatology) Bryson Ha, OD as Consulting Physician (Optometry) Nicholas Lose, MD as Consulting Physician (Hematology and Oncology) Telford Nab, RN as Oncology Nurse Navigator  CHIEF COMPLAINTS/PURPOSE OF CONSULTATION: Lung cancer  Oncology History Overview Note  Adenosquamous lung cancer  # MULTIPLE MYELOMA [Dr.Gorsuch]; s/p Auto-Transplant [[facility]]- remission.   # MAY 2023-   # The patient is currently undergoing systemic chemotherapy with carboplatin for AUC of 5, Alimta 500 Mg/M2 and Keytruda 200 Mg IV every 3 weeks status post 1 cycle started on January 29, 2022. [Dr.Mohammed; [place]]; single agent keytruda- x3 cycles; stopped fall- 2023- sec to colitis  # march 2024- start carbo AUC- 4; -alimta   #Prostate cancer prostatectomy 2003; multiple myeloma- 2012-remission   Malignant neoplasm of prostate  02/23/2007 Initial Diagnosis   Malignant neoplasm of prostate ([place])   10/28/2020 Cancer Staging   Staging form: Prostate, AJCC 6th Edition - Clinical: Stage III (T3, N0, M0) - Signed by Heath Lark, MD on 10/28/2020   11/24/2021 Procedure   Successful ultrasound guided diagnostic and therapeutic left thoracentesis yielding 750 cc of pleural fluid.     12/02/2021 PET scan   1. Hypermetabolic nodular areas of consolidation in the left lung with a hypermetabolic malignant left pleural effusion. Hypermetabolism extends inferiorly along the left hemidiaphragm. Associated hypermetabolic prepericardiac lymph nodes. Findings may be  metastatic. Primary bronchogenic carcinoma cannot be excluded. 2. Uptake in the soft tissues of the distal left foot with associated edema in the soft tissues. Please correlate clinically. 3. Cholelithiasis and choledocholithiasis. No biliary ductal dilatation. 4. Bilateral renal stones. 5. Aortic atherosclerosis (ICD10-I70.0). Coronary artery calcification.     12/18/2021 Procedure   Successful CT guided core needle core biopsy of LEFT lung mass, as above   Multiple myeloma in relapse  07/09/2011 Initial Diagnosis   Multiple myeloma in relapse ([place])   01/28/2022 - 02/19/2022 Chemotherapy   Patient is on Treatment Plan : LUNG Carboplatin (5) + Pemetrexed (500) + Pembrolizumab (200) D1 q21d Induction x 4 cycles / Maintenance Pemetrexed (500) + Pembrolizumab (200) D1 q21d     01/28/2022 - 06/09/2022 Chemotherapy   Patient is on Treatment Plan : Pembrolizumab (200) D1 q21d     10/26/2022 -  Chemotherapy   Patient is on Treatment Plan : carbo-Alimta every 3 weeks- followed by Maintenace alimta     Lung cancer  12/02/2021 PET scan   1. Hypermetabolic nodular areas of consolidation in the left lung with a hypermetabolic malignant left pleural effusion. Hypermetabolism extends inferiorly along the left hemidiaphragm. Associated hypermetabolic prepericardiac lymph nodes. Findings may be metastatic. Primary bronchogenic carcinoma cannot be excluded. 2. Uptake in the soft tissues of the distal left foot with associated edema in the soft tissues. Please correlate clinically. 3. Cholelithiasis and choledocholithiasis. No biliary ductal dilatation. 4. Bilateral renal stones. 5. Aortic atherosclerosis (ICD10-I70.0). Coronary artery calcification.   12/18/2021 Pathology Results   A. LUNG MASS, LEFT, BIOPSY:  -  Adenocarcinoma  -  See comment  Six immunohistochemical stains were performed with adequate control. The adenocarcinoma is diffusely and strongly positive for cytokeratin 7 and the pulmonary adeno  marker TTF-1.  The tumor is negative for the pulmonary adeno marker Napsin A.  The tumor does show a patchy focal positivity for the squamous marker p40 suggesting an adenosquamous differentiation to this tumor.  The tumor is negative for the prostate markers prostate-specific antigen and prostein.    12/23/2021 Initial Diagnosis   Lung cancer ([place])   12/23/2021 Cancer Staging   Staging form: Lung, AJCC 8th Edition - Clinical stage from 12/23/2021: Stage IVA (cT1c, cN2, cM1a) - Signed by Curt Bears, MD on 01/05/2022 Stage prefix: Initial diagnosis   01/01/2022 Imaging   MRI brain Negative for metastatic disease.   01/28/2022 - 02/19/2022 Chemotherapy   Patient is on Treatment Plan : LUNG Carboplatin (5) + Pemetrexed (500) + Pembrolizumab (200) D1 q21d Induction x 4 cycles / Maintenance Pemetrexed (500) + Pembrolizumab (200) D1 q21d     01/28/2022 - 06/09/2022 Chemotherapy   Patient is on Treatment Plan : Pembrolizumab (200) D1 q21d     10/26/2022 -  Chemotherapy   Patient is on Treatment Plan : carbo-Alimta every 3 weeks- followed by Maintenace alimta     Malignant neoplasm of upper lobe of left lung  01/28/2022 - 06/09/2022 Chemotherapy   Patient is on Treatment Plan : Pembrolizumab (200) D1 q21d     03/17/2022 Initial Diagnosis   Malignant neoplasm of upper lobe of left lung ([place])   03/17/2022 Cancer Staging   Staging form: Lung, AJCC 8th Edition - Clinical: Stage IVB (cT2, cN2, cM1c) - Signed by Cammie Sickle, MD on 03/17/2022   10/26/2022 -  Chemotherapy   Patient is on Treatment Plan : carbo-Alimta every 3 weeks- followed by Maintenace alimta      HISTORY OF PRESENTING ILLNESS: Patient ambulating- with assistance.  Accompanied by friend.  [name] 77 y.o.  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 Reports taking Potassium 20 Meq QD.  Patient denies any nausea vomiting. Patient states his appetite is fair.  His weight is stable.  Review of Systems  Constitutional:  Positive for malaise/fatigue and weight loss. Negative for chills, diaphoresis and fever.  HENT:  Negative for nosebleeds and sore throat.   Eyes:  Negative for double vision.  Respiratory:  Positive for shortness of breath. Negative for cough, hemoptysis, sputum production and wheezing.   Cardiovascular:  Positive for chest pain. Negative for palpitations, orthopnea and leg swelling.  Gastrointestinal:  Positive for constipation. Negative for abdominal pain, blood in stool, diarrhea, heartburn, melena, nausea and vomiting.  Genitourinary:  Negative for dysuria, frequency and urgency.  Musculoskeletal:  Positive for back pain and joint pain.  Skin: Negative.  Negative for itching and rash.  Neurological:  Negative for dizziness, tingling, focal weakness, weakness and headaches.  Endo/Heme/Allergies:  Does not bruise/bleed easily.  Psychiatric/Behavioral:  Negative for depression. The patient is not nervous/anxious and does not have insomnia.     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   diffuse scattered osseous metastatic disease   DDD (degenerative disc  disease)    Fractures    compression T12,L3   GERD (gastroesophageal reflux disease)    History of chemotherapy    weekly Velcade,Cytoxan   HTN (hypertension)    Hypercalcemia of malignancy    Hyperlipemia    Multiple myeloma 07/09/2011   Multiple myeloma    Myeloma  kidney    Peripheral neuropathy    toes, sees Dr Krista Blue   Prostate cancer 2008   s/p prostatectomy   Prostate cancer 03/2002   Renal insufficiency     SURGICAL HISTORY: Past Surgical History:  Procedure Laterality Date   BASAL CELL CARCINOMA EXCISION Left 10/22/2020   [facility] (Dr. Leonie Man)   CATARACT EXTRACTION W/ INTRAOCULAR LENS IMPLANT Bilateral    DEXA  05/2011   spine 0.7, L femur 0.0, R femur -0.6, normal   ERCP N/A 05/26/2022   Procedure: ENDOSCOPIC RETROGRADE CHOLANGIOPANCREATOGRAPHY (ERCP);  Surgeon: Lucilla Lame, MD;  Location: [facility] ENDOSCOPY;  Service: Endoscopy;  Laterality: N/A;   KIDNEY STONE SURGERY     KIDNEY STONE SURGERY  06/2009   Stone removed from bladder   MELANOMA EXCISION Left 06/05/2019   Procedure: REEXCISION MELANOMA OF LEFT POSTERIOR AURICLE;  Surgeon: Johnathan Hausen, MD;  Location: [facility];  Service: General;  Laterality: Left;   PORTACATH PLACEMENT  08/05/11   right sided portacath   PROSTATECTOMY  04/06/07   TOE AMPUTATION Left 2017   2nd toe left foot    SOCIAL HISTORY: Social History   Socioeconomic History   Marital status: Single    Spouse name: Not on file   Number of children: Not on file   Years of education: Not on file   Highest education level: Not on file  Occupational History   Occupation: Sales    Comment: [employer] [place]  Tobacco Use   Smoking status: Former    Packs/day: 2.00    Years: 30.00    Additional pack years: 0.00    Total pack years: 60.00    Types: Cigarettes    Quit date: 07/27/1996    Years since quitting: 26.2   Smokeless tobacco: Never  Vaping Use   Vaping Use: Never used  Substance and Sexual Activity   Alcohol use: No   Drug use: No   Sexual activity: Never  Other Topics Concern   Not on file  Social History Narrative   [place]- closer to [place]; condos; [wife with stroke- better]. Quit smoking 1998; no alcohol.    Social Determinants of Health   Financial  Resource Strain: Not on file  Food Insecurity: No Food Insecurity (05/26/2022)   Hunger Vital Sign    Worried About Running Out of Food in the Last Year: Never true    Ran Out of Food in the Last Year: Never true  Transportation Needs: No Transportation Needs (05/26/2022)   PRAPARE - Hydrologist (Medical): No    Lack of Transportation (Non-Medical): No  Physical Activity: Not on file  Stress: Not on file  Social Connections: Not on file  Intimate Partner Violence: Not At Risk (05/26/2022)   Humiliation, Afraid, Rape, and Kick questionnaire    Fear of Current or Ex-Partner: No    Emotionally Abused: No    Physically Abused: No    Sexually Abused: No    FAMILY HISTORY: Family History  Problem Relation Age of Onset   Pneumonia Mother    Stroke Father    Cancer Brother  lung   Kidney cancer Sister    Colon cancer Neg Hx     ALLERGIES:  is allergic to celebrex [celecoxib], cymbalta [duloxetine hcl], hydromorphone hcl, meloxicam, oxycontin [oxycodone], percocet [oxycodone-acetaminophen], vicodin [hydrocodone-acetaminophen], and lyrica [pregabalin].  MEDICATIONS:  Current Outpatient Medications  Medication Sig Dispense Refill   acetaminophen (TYLENOL) 325 MG tablet Take 2 tablets (650 mg total) by mouth every 6 (six) hours as needed for mild pain (or Fever >/= 101).     B Complex Vitamins (VITAMIN-B COMPLEX) TABS Take 1 tablet by mouth daily.     Cyanocobalamin (VITAMIN B 12 PO) Take 1,000 mg by mouth daily.     doxycycline (VIBRA-TABS) 100 MG tablet Take 1 tablet (100 mg total) by mouth 2 (two) times daily. 20 tablet 0   fentaNYL (DURAGESIC) 75 MCG/HR Place 1 patch onto the skin every other day.     gentamicin cream (GARAMYCIN) 0.1 % Apply 1 Application topically 3 (three) times daily. 30 g 1   lidocaine-prilocaine (EMLA) cream Apply topically as needed. Apply to port site one hour before treatment and cover with plastic wrap 30 g 2   mupirocin  ointment (BACTROBAN) 2 % Apply 1 Application topically 2 (two) times daily. 22 g 0   NONFORMULARY OR COMPOUNDED ITEM Baclofn/amitrp/ketamn cream topically to feet TID prn for neuropathy     OLANZapine (ZYPREXA) 5 MG tablet Take 1 tablet (5 mg total) by mouth at bedtime. 90 tablet 1   ondansetron (ZOFRAN) 4 MG tablet Take 4 mg by mouth every 8 (eight) hours as needed.     Oxycodone HCl 10 MG TABS Take 1-2 tablets by mouth 3 (three) times daily as needed.     pantoprazole (PROTONIX) 40 MG tablet Take 1 tablet (40 mg total) by mouth daily. 90 tablet 3   prochlorperazine (COMPAZINE) 10 MG tablet Take 1 tablet (10 mg total) by mouth every 6 (six) hours as needed for nausea or vomiting. 30 tablet 0   enzalutamide (XTANDI) 40 MG tablet Take 2 tablets (80 mg total) by mouth daily. (Patient not taking: Reported on 06/30/2022) 60 tablet 0   folic acid (FOLVITE) 1 MG tablet Take 1 tablet (1 mg total) by mouth daily. 30 tablet 4   Loperamide HCl (IMODIUM PO) Take by mouth daily as needed. (Patient not taking: Reported on 09/04/2022)     potassium chloride SA (KLOR-CON M) 20 MEQ tablet 1 pill twice a day (Patient not taking: Reported on 10/26/2022) 60 tablet 3   predniSONE (DELTASONE) 10 MG tablet Take 1 pill [10 mg] once a day x 2 week; and then 1/2 pill [5mg ] a day- do not STOP until directed. Take with food in AM. (Patient not taking: Reported on 10/26/2022) 30 tablet 0   No current facility-administered medications for this visit.   Facility-Administered Medications Ordered in Other Visits  Medication Dose Route Frequency Provider Last Rate Last Admin   0.9 %  sodium chloride infusion   Intravenous Once Cammie Sickle, MD       CARBOplatin (PARAPLATIN) 310 mg in sodium chloride 0.9 % 100 mL chemo infusion  310 mg Intravenous Once Cammie Sickle, MD       dexamethasone (DECADRON) 10 mg in sodium chloride 0.9 % 50 mL IVPB  10 mg Intravenous Once Charlaine Dalton R, MD       fosaprepitant (EMEND)  150 mg in sodium chloride 0.9 % 145 mL IVPB  150 mg Intravenous Once Cammie Sickle, MD  heparin lock flush 100 unit/mL  500 Units Intracatheter Once PRN Cammie Sickle, MD       leuprolide (6 Month) (ELIGARD) injection 45 mg  45 mg Subcutaneous Once Jane Canary, MD       palonosetron (ALOXI) injection 0.25 mg  0.25 mg Intravenous Once Cammie Sickle, MD       PEMEtrexed (ALIMTA) 900 mg in sodium chloride 0.9 % 100 mL chemo infusion  500 mg/m2 (Treatment Plan Recorded) Intravenous Once Charlaine Dalton R, MD       sodium chloride flush (NS) 0.9 % injection 10 mL  10 mL Intracatheter PRN Cammie Sickle, MD        PHYSICAL EXAMINATION:   Vitals:   10/26/22 0930  BP: 120/71  Pulse: 89  Resp: 18  Temp: (!) 96.6 F (35.9 C)  SpO2: 97%   Filed Weights   10/26/22 0930  Weight: 146 lb 11.2 oz (66.5 kg)    Physical Exam Vitals and nursing note reviewed.  HENT:     Head: Normocephalic and atraumatic.     Mouth/Throat:     Pharynx: Oropharynx is clear.  Eyes:     Extraocular Movements: Extraocular movements intact.     Pupils: Pupils are equal, round, and reactive to light.  Cardiovascular:     Rate and Rhythm: Normal rate and regular rhythm.  Pulmonary:     Comments: Decreased breath sounds bilaterally.  Abdominal:     Palpations: Abdomen is soft.  Musculoskeletal:        General: Normal range of motion.     Cervical back: Normal range of motion.  Skin:    General: Skin is warm.  Neurological:     General: No focal deficit present.     Mental Status: He is alert and oriented to person, place, and time.  Psychiatric:        Behavior: Behavior normal.        Judgment: Judgment normal.     LABORATORY DATA:  I have reviewed the data as listed Lab Results  Component Value Date   WBC 7.3 10/26/2022   HGB 12.2 (L) 10/26/2022   HCT 36.3 (L) 10/26/2022   MCV 98.9 10/26/2022   PLT 206 10/26/2022   Recent Labs    09/04/22 0912  10/09/22 0925 10/26/22 0855  NA 136 136 133*  K 3.2* 3.3* 3.1*  CL 99 102 102  CO2 28 26 25   GLUCOSE 135* 119* 146*  BUN 14 17 14   CREATININE 1.01 1.09 1.10  CALCIUM 8.4* 8.8* 8.2*  GFRNONAA >60 >60 >60  PROT 6.6 7.1 6.3*  ALBUMIN 3.3* 3.5 3.1*  AST 26 26 25   ALT 11 19 9   ALKPHOS 49 58 46  BILITOT 0.4 0.6 0.6    RADIOGRAPHIC STUDIES: I have personally reviewed the radiological images as listed and agreed with the findings in the report. CT CHEST WO CONTRAST  Result Date: 10/02/2022 CLINICAL DATA:  Left upper lobe lung cancer, non-small-cell. On systemic therapy EXAM: CT CHEST WITHOUT CONTRAST TECHNIQUE: Multidetector CT imaging of the chest was performed following the standard protocol without IV contrast. RADIATION DOSE REDUCTION: This exam was performed according to the departmental dose-optimization program which includes automated exposure control, adjustment of the mA and/or kV according to patient size and/or use of iterative reconstruction technique. COMPARISON:  CT 06/15/2022 and older.  Bone scan 06/15/2022 FINDINGS: Cardiovascular: Left upper chest port. Trace pericardial fluid. The heart is nonenlarged. Coronary artery calcifications are seen. On this non  IV contrast exam the thoracic aorta has a normal course and caliber with scattered calcified plaque. There is some enlargement of the main pulmonary arteries. Please correlate for evidence of pulmonary artery hypertension. Mediastinum/Nodes: No specific abnormal lymph node enlargement identified on this noncontrast examination in the axillary regions, hilum or mediastinum. Small thyroid gland. There is some debris in the trachea. Normal caliber thoracic esophagus. There is a new soft tissue nodule seen along the left side of the pericardium on series 2, image 112 measuring 2.2 by 1.3 cm. Previously there was only a small nodule measuring 7 mm. This could be an abnormal node versus a separate soft tissue mass. Additional nodule seen  anterior to the pericardium on the left side on series 2, image 115 measuring 9 mm. Lungs/Pleura: Lungs demonstrates some apical subpleural paraseptal changes and scarring/fibrotic change. Additional areas of fibrosis along the right lung base. The right lung is without consolidation, pneumothorax or effusion. Right lung spiculated nodule seen once again. On the prior 7 mm in maximal dimension and today on series 3, image 74 8 mm in the right upper lobe. Few other punctate nodules identified. Example measures 2-3 mm in the middle lobe on series 3, image 110 this may be along the bronchus. No new right-sided dominant lung nodule. Small left lower lobe loculated pleural effusion. Extensive pleural thickening with some nodular areas once again identified. Focus along the medial left lung base on series 2 image 102 today measures 2.2 x 1.3 cm. Previously this same area would have measured 1.5 by 0.9 cm. There also nodular areas in the left lung itself. Example left upper lobe anteriorly which previously had dimensions of 2.1 x 1.5 cm and today on series 2, image 61 2.1 by 1.5 cm which is similar. Once again there are areas of scarring atelectatic changes to the lung and fibrosis. Upper Abdomen: In the upper abdomen the adrenal glands are preserved. Bilateral renal stones. Gallstones. Musculoskeletal: Osteopenia. Degenerative changes are noted along the spine. Stable compression at multiple levels thoracic spine with areas of augmentation cement including at T12 and L2. There is stable moderate compression at T5. Focal nodular area of abnormal soft tissue noted along the chest wall anteriorly along left side at the level of the xiphoid process and abutting the costochondral cartilage at the sixth and seventh rib levels. This nodular tissue today measures 3.9 by 1.9 cm. In retrospect there is faint tissue in this location measuring 2.4 x 1.7 cm. Developing soft tissue metastasis is likely. IMPRESSION: Overall progression  of disease. There is increasing soft tissue nodules along the margin of the pericardium as well as along the anterior left chest wall. Slight increasing areas of pleural nodularity along the left hemithorax. Stable loculated left small pleural effusion. Separate underlying chronic lung changes with scarring, fibrosis and emphysematous change. Aortic Atherosclerosis (ICD10-I70.0) and Emphysema (ICD10-J43.9). Electronically Signed   By: Jill Side M.D.   On: 10/02/2022 16:23     Malignant neoplasm of upper lobe of left lung [facility]) # May 2023- Stage IV (T1c, N2, M a)  adenocarcinoma [Dr.Mohammed]Molecular studies by foundation 1 showed no actionable mutations.  Extreme poor tolerance to status post cycle number 2 of carbo Alimta [facility] 26th, 2023]-hospital with sepsis. PDL-1 status-1%.  Currently status post Keytruda #3-On HOLD sec to colitis. However, March 8th, 2024- Overall progression of disease; There is increasing soft tissue nodules along the margin of thepericardium as well as along the anterior left chest wall; Slight increasing areas of  pleural nodularity along the left Hemithorax; Stable loculated left small pleural effusion. Discontinue Keytruda.-Given the progressive disease last Keytruda fall 2023.   # Recommend proceeding with Carbo-Alimta x 2-4 cycles [based on response/tolerance]. Growth factor- would be given as prophylaxis for chemotherapy-induced neutropenia to prevent febrile neutropenias.   Proceed with cycle #1 today- carbo [AUC-4]-alimta. Labs today reviewed.  # Diarrhea/colitis sec to [place]- off prednisone.   Monitor for worsening on chemotherapy.Stable.   # Constipation sec to Oxycodone- on miralax. Stable.   # Weight loss/loss of appetite- sec to malignancy-stable on Zyprexa. Stable.   # Hypokalemia- 3.1; recommend compliance with Kdur BID. Stable.   #Recurrent castrate resistant prostate cancer- [Dr.[facility]]  s/p ELigard q 6 M; HOLD off X-tandi-given  absence of overt metastatic disease noted on the bone scan. Monitor for now.  Eligard  today- Stable.   # Hx of Multiple Myeloma [2012]-  Remission- monitor for now.  Stable.   # Pain- chronic- none oxycodone [constipation]; fentanyl patch -75 mcg-worsening-secondary to progressive malignancy.  See above. Stable.   # IV ACCESS: port- functional.   Eligard q6 M-last- 10/26/22; B12 #1- on 10/26/22.    # DISPOSITION: # Eligard today-   # Chemo carbo-alimta today; b12 injection today  # follow up 1 week- APP- labs- cbc/bmp; possible IVFs over 1 hour-  # follow up in 3 weeks-MD;  port/labs- cbc/cmp; carbo-alimta; D-2 Ellen Henri- Dr.B     Cammie Sickle, MD 10/26/2022 10:29 AM

## 2022-10-25 NOTE — Assessment & Plan Note (Addendum)
#   May 2023- Stage IV (T1c, N2, M a)  adenocarcinoma [Dr.Mohammed]Molecular studies by foundation 1 showed no actionable mutations.  Extreme poor tolerance to status post cycle number 2 of carbo Alimta [facility] 26th, 2023]-hospital with sepsis. PDL-1 status-1%.  Currently status post Keytruda #3-On HOLD sec to colitis. However, March 8th, 2024- Overall progression of disease; There is increasing soft tissue nodules along the margin of thepericardium as well as along the anterior left chest wall; Slight increasing areas of pleural nodularity along the left Hemithorax; Stable loculated left small pleural effusion. Discontinue Keytruda.-Given the progressive disease last Keytruda fall 2023.   # Recommend proceeding with Carbo-Alimta x 2-4 cycles [based on response/tolerance]. Growth factor- would be given as prophylaxis for chemotherapy-induced neutropenia to prevent febrile neutropenias.   Proceed with cycle #1 today. Labs today reviewed. ..   # Diarrhea/colitis sec to [place]- off prednisone.  Stable.  Monitor for worsening on chemotherapy.  # Constipation sec to Oxycodone- on miralax. Stable...  # Weight loss/loss of appetite- sec to malignancy-stable on Zyprexa.   # Hypokalemia- 3.2; recommend compliance with Kdur BID.   #Recurrent castrate resistant prostate cancer- [Dr.[facility]]  s/p ELigard q 6 M; HOLD off X-tandi-given absence of overt metastatic disease noted on the bone scan. Monitor for now.  Plan Eligard next visit.  # Hx of Multiple Myeloma [2012]-  Remission- monitor for now.  Await labs.  Stable.  # Pain- chronic- none oxycodone [constipation]; fentanyl patch -75 mcg-worsening-secondary to progressive malignancy.  See above.  #Incidental findings on Imaging  CT , march 2024: Aortic Atherosclerosis  and Emphysema  I reviewed/discussed/counseled the patient.   # PROGNOSIS: I had a long discussion with the patient regarding overall prognosis of metastatic lung cancer is  poor.  Median survival about 1 year.  Discussed that if tolerates chemotherapy would recommend ongoing therapy-but still incurable.  However if he has tolerance to therapy/or progressive disease-would recommend hospice.  Patient interested in proceeding with treatment.  # IV ACCESS: port- functional.   Eligard q6 M-last- 05/06/2022.   # DISPOSITION: # follow up on April 1st- MD;  port/labs- cbc/cmp; carbo-alimta; Eligard ; d-2 udenyca- Dr.B

## 2022-10-26 ENCOUNTER — Other Ambulatory Visit: Payer: Self-pay | Admitting: Internal Medicine

## 2022-10-26 ENCOUNTER — Inpatient Hospital Stay: Payer: Medicare Other

## 2022-10-26 ENCOUNTER — Encounter: Payer: Self-pay | Admitting: Internal Medicine

## 2022-10-26 ENCOUNTER — Inpatient Hospital Stay (HOSPITAL_BASED_OUTPATIENT_CLINIC_OR_DEPARTMENT_OTHER): Payer: Medicare Other | Admitting: Internal Medicine

## 2022-10-26 ENCOUNTER — Inpatient Hospital Stay: Payer: Medicare Other | Attending: Hematology and Oncology

## 2022-10-26 VITALS — BP 120/71 | HR 89 | Temp 96.6°F | Resp 18 | Wt 146.7 lb

## 2022-10-26 DIAGNOSIS — C3412 Malignant neoplasm of upper lobe, left bronchus or lung: Secondary | ICD-10-CM | POA: Diagnosis present

## 2022-10-26 DIAGNOSIS — Z79630 Long term (current) use of alkylating agent: Secondary | ICD-10-CM | POA: Insufficient documentation

## 2022-10-26 DIAGNOSIS — Z87891 Personal history of nicotine dependence: Secondary | ICD-10-CM | POA: Insufficient documentation

## 2022-10-26 DIAGNOSIS — C9002 Multiple myeloma in relapse: Secondary | ICD-10-CM

## 2022-10-26 DIAGNOSIS — Z5111 Encounter for antineoplastic chemotherapy: Secondary | ICD-10-CM | POA: Diagnosis present

## 2022-10-26 DIAGNOSIS — Z79631 Long term (current) use of antimetabolite agent: Secondary | ICD-10-CM | POA: Insufficient documentation

## 2022-10-26 DIAGNOSIS — I10 Essential (primary) hypertension: Secondary | ICD-10-CM | POA: Diagnosis not present

## 2022-10-26 DIAGNOSIS — C9001 Multiple myeloma in remission: Secondary | ICD-10-CM | POA: Diagnosis not present

## 2022-10-26 DIAGNOSIS — D701 Agranulocytosis secondary to cancer chemotherapy: Secondary | ICD-10-CM | POA: Insufficient documentation

## 2022-10-26 DIAGNOSIS — C61 Malignant neoplasm of prostate: Secondary | ICD-10-CM | POA: Insufficient documentation

## 2022-10-26 DIAGNOSIS — Z95828 Presence of other vascular implants and grafts: Secondary | ICD-10-CM

## 2022-10-26 LAB — CBC WITH DIFFERENTIAL (CANCER CENTER ONLY)
Abs Immature Granulocytes: 0.04 10*3/uL (ref 0.00–0.07)
Basophils Absolute: 0.1 10*3/uL (ref 0.0–0.1)
Basophils Relative: 1 %
Eosinophils Absolute: 0.2 10*3/uL (ref 0.0–0.5)
Eosinophils Relative: 3 %
HCT: 36.3 % — ABNORMAL LOW (ref 39.0–52.0)
Hemoglobin: 12.2 g/dL — ABNORMAL LOW (ref 13.0–17.0)
Immature Granulocytes: 1 %
Lymphocytes Relative: 15 %
Lymphs Abs: 1.1 10*3/uL (ref 0.7–4.0)
MCH: 33.2 pg (ref 26.0–34.0)
MCHC: 33.6 g/dL (ref 30.0–36.0)
MCV: 98.9 fL (ref 80.0–100.0)
Monocytes Absolute: 1.1 10*3/uL — ABNORMAL HIGH (ref 0.1–1.0)
Monocytes Relative: 14 %
Neutro Abs: 4.9 10*3/uL (ref 1.7–7.7)
Neutrophils Relative %: 66 %
Platelet Count: 206 10*3/uL (ref 150–400)
RBC: 3.67 MIL/uL — ABNORMAL LOW (ref 4.22–5.81)
RDW: 12.3 % (ref 11.5–15.5)
WBC Count: 7.3 10*3/uL (ref 4.0–10.5)
nRBC: 0 % (ref 0.0–0.2)

## 2022-10-26 LAB — CMP (CANCER CENTER ONLY)
ALT: 9 U/L (ref 0–44)
AST: 25 U/L (ref 15–41)
Albumin: 3.1 g/dL — ABNORMAL LOW (ref 3.5–5.0)
Alkaline Phosphatase: 46 U/L (ref 38–126)
Anion gap: 6 (ref 5–15)
BUN: 14 mg/dL (ref 8–23)
CO2: 25 mmol/L (ref 22–32)
Calcium: 8.2 mg/dL — ABNORMAL LOW (ref 8.9–10.3)
Chloride: 102 mmol/L (ref 98–111)
Creatinine: 1.1 mg/dL (ref 0.61–1.24)
GFR, Estimated: >60 mL/min (ref >60)
Glucose, Bld: 146 mg/dL — ABNORMAL HIGH (ref 70–99)
Potassium: 3.1 mmol/L — ABNORMAL LOW (ref 3.5–5.1)
Sodium: 133 mmol/L — ABNORMAL LOW (ref 135–145)
Total Bilirubin: 0.6 mg/dL (ref 0.3–1.2)
Total Protein: 6.3 g/dL — ABNORMAL LOW (ref 6.5–8.1)

## 2022-10-26 LAB — TSH: TSH: 2.986 u[IU]/mL (ref 0.350–4.500)

## 2022-10-26 MED ORDER — SODIUM CHLORIDE 0.9 % IV SOLN
150.0000 mg | Freq: Once | INTRAVENOUS | Status: AC
  Administered 2022-10-26: 150 mg via INTRAVENOUS
  Filled 2022-10-26: qty 150

## 2022-10-26 MED ORDER — SODIUM CHLORIDE 0.9 % IV SOLN
Freq: Once | INTRAVENOUS | Status: AC
  Filled 2022-10-26: qty 250

## 2022-10-26 MED ORDER — SODIUM CHLORIDE 0.9% FLUSH
10.0000 mL | INTRAVENOUS | Status: DC | PRN
  Filled 2022-10-26: qty 10

## 2022-10-26 MED ORDER — SODIUM CHLORIDE 0.9 % IV SOLN
500.0000 mg/m2 | Freq: Once | INTRAVENOUS | Status: AC
  Administered 2022-10-26: 900 mg via INTRAVENOUS
  Filled 2022-10-26: qty 20

## 2022-10-26 MED ORDER — CYANOCOBALAMIN 1000 MCG/ML IJ SOLN
1000.0000 ug | Freq: Once | INTRAMUSCULAR | Status: AC
  Administered 2022-10-26: 1000 ug via INTRAMUSCULAR
  Filled 2022-10-26: qty 1

## 2022-10-26 MED ORDER — LEUPROLIDE ACETATE (6 MONTH) 45 MG ~~LOC~~ KIT
45.0000 mg | PACK | Freq: Once | SUBCUTANEOUS | Status: AC
  Administered 2022-10-26: 45 mg via SUBCUTANEOUS
  Filled 2022-10-26: qty 45

## 2022-10-26 MED ORDER — SODIUM CHLORIDE 0.9 % IV SOLN
10.0000 mg | Freq: Once | INTRAVENOUS | Status: AC
  Administered 2022-10-26: 10 mg via INTRAVENOUS
  Filled 2022-10-26: qty 10

## 2022-10-26 MED ORDER — PALONOSETRON HCL INJECTION 0.25 MG/5ML
0.2500 mg | Freq: Once | INTRAVENOUS | Status: AC
  Administered 2022-10-26: 0.25 mg via INTRAVENOUS
  Filled 2022-10-26: qty 5

## 2022-10-26 MED ORDER — FOLIC ACID 1 MG PO TABS: 1.0000 mg | ORAL_TABLET | Freq: Every day | ORAL | 4 refills | Status: DC

## 2022-10-26 MED ORDER — HEPARIN SOD (PORK) LOCK FLUSH 100 UNIT/ML IV SOLN
500.0000 [IU] | Freq: Once | INTRAVENOUS | Status: AC | PRN
  Administered 2022-10-26: 500 [IU]
  Filled 2022-10-26: qty 5

## 2022-10-26 MED ORDER — SODIUM CHLORIDE 0.9 % IV SOLN
310.0000 mg | Freq: Once | INTRAVENOUS | Status: AC
  Administered 2022-10-26: 310 mg via INTRAVENOUS
  Filled 2022-10-26: qty 31

## 2022-10-26 NOTE — Addendum Note (Signed)
Addended by: Charlaine Dalton R on: 10/26/2022 11:27 AM   Modules accepted: Orders

## 2022-10-26 NOTE — Patient Instructions (Signed)
[facility]  Discharge Instructions: Thank you for choosing [facility] to provide your oncology and hematology care.  If you have a lab appointment with the [facility], please go directly to the [facility] and check in at the registration area.  Wear comfortable clothing and clothing appropriate for easy access to any Portacath or PICC line.   We strive to give you quality time with your provider. You may need to reschedule your appointment if you arrive late (15 or more minutes).  Arriving late affects you and other patients whose appointments are after yours.  Also, if you miss three or more appointments without notifying the office, you may be dismissed from the clinic at the provider's discretion.      For prescription refill requests, have your pharmacy contact our office and allow 72 hours for refills to be completed.    Today you received the following chemotherapy and/or immunotherapy agents alimta/carboplatin    To help prevent nausea and vomiting after your treatment, we encourage you to take your nausea medication as directed.  BELOW ARE SYMPTOMS THAT SHOULD BE REPORTED IMMEDIATELY: *FEVER GREATER THAN 100.4 F (38 C) OR HIGHER *CHILLS OR SWEATING *NAUSEA AND VOMITING THAT IS NOT CONTROLLED WITH YOUR NAUSEA MEDICATION *UNUSUAL SHORTNESS OF BREATH *UNUSUAL BRUISING OR BLEEDING *URINARY PROBLEMS (pain or burning when urinating, or frequent urination) *BOWEL PROBLEMS (unusual diarrhea, constipation, pain near the anus) TENDERNESS IN MOUTH AND THROAT WITH OR WITHOUT PRESENCE OF ULCERS (sore throat, sores in mouth, or a toothache) UNUSUAL RASH, SWELLING OR PAIN  UNUSUAL VAGINAL DISCHARGE OR ITCHING   Items with * indicate a potential emergency and should be followed up as soon as possible or go to the Emergency Department if any problems should occur.  Please show the CHEMOTHERAPY ALERT CARD or IMMUNOTHERAPY ALERT CARD at  check-in to the Emergency Department and triage nurse.  Should you have questions after your visit or need to cancel or reschedule your appointment, please contact [facility]  [phone] and follow the prompts.  Office hours are 8:00 a.m. to 4:30 p.m. Monday - Friday. Please note that voicemails left after 4:00 p.m. may not be returned until the following business day.  We are closed weekends and major holidays. You have access to a nurse at all times for urgent questions. Please call the main number to the clinic [phone] and follow the prompts.  For any non-urgent questions, you may also contact your provider using MyChart. We now offer e-Visits for anyone 94 and older to request care online for non-urgent symptoms. For details visit [email or web address].   Also download the MyChart app! Go to the app store, search "MyChart", open the app, select [facility], and log in with your MyChart username and password.

## 2022-10-27 ENCOUNTER — Other Ambulatory Visit: Payer: Self-pay

## 2022-10-27 ENCOUNTER — Inpatient Hospital Stay: Payer: Medicare Other

## 2022-10-27 ENCOUNTER — Encounter: Payer: Medicare Other | Admitting: Family Medicine

## 2022-10-27 DIAGNOSIS — C9002 Multiple myeloma in relapse: Secondary | ICD-10-CM

## 2022-10-27 DIAGNOSIS — Z5111 Encounter for antineoplastic chemotherapy: Secondary | ICD-10-CM | POA: Diagnosis not present

## 2022-10-27 DIAGNOSIS — C3412 Malignant neoplasm of upper lobe, left bronchus or lung: Secondary | ICD-10-CM

## 2022-10-27 LAB — T4: T4, Total: 6.2 ug/dL (ref 4.5–12.0)

## 2022-10-27 MED ORDER — PEGFILGRASTIM-CBQV 6 MG/0.6ML ~~LOC~~ SOSY
6.0000 mg | PREFILLED_SYRINGE | Freq: Once | SUBCUTANEOUS | Status: AC
  Administered 2022-10-27: 6 mg via SUBCUTANEOUS
  Filled 2022-10-27: qty 0.6

## 2022-10-29 ENCOUNTER — Other Ambulatory Visit: Payer: Medicare Other

## 2022-11-02 ENCOUNTER — Inpatient Hospital Stay: Payer: Medicare Other

## 2022-11-02 ENCOUNTER — Telehealth: Payer: Self-pay | Admitting: *Deleted

## 2022-11-02 ENCOUNTER — Other Ambulatory Visit: Payer: Self-pay | Admitting: *Deleted

## 2022-11-02 ENCOUNTER — Inpatient Hospital Stay: Payer: Medicare Other | Admitting: Nurse Practitioner

## 2022-11-02 DIAGNOSIS — C9002 Multiple myeloma in relapse: Secondary | ICD-10-CM

## 2022-11-02 DIAGNOSIS — C3412 Malignant neoplasm of upper lobe, left bronchus or lung: Secondary | ICD-10-CM

## 2022-11-02 MED ORDER — DIPHENOXYLATE-ATROPINE 2.5-0.025 MG PO TABS: 1.0000 | ORAL_TABLET | Freq: Four times a day (QID) | ORAL | 0 refills | Status: DC | PRN

## 2022-11-02 NOTE — Telephone Encounter (Signed)
Received message from pt's caregiver, [name], that pt has been declining and very weak. Pt has been having episodes of diarrhea. Pt would like to cancel his appt today and be referred to hospice. Pt's caregiver also requests prescription to help with his diarrhea.   Please advise.

## 2022-11-02 NOTE — Telephone Encounter (Signed)
Hospice referral placed for patient. Pt having diarrhea not relieved by imodium. Lomotil being sent in to pharmacy. Will cancel all future infusion appts.

## 2022-11-05 ENCOUNTER — Encounter: Payer: Medicare Other | Admitting: Family Medicine

## 2022-11-16 ENCOUNTER — Ambulatory Visit: Payer: Medicare Other | Admitting: Internal Medicine

## 2022-11-16 ENCOUNTER — Ambulatory Visit: Payer: Medicare Other

## 2022-11-16 ENCOUNTER — Other Ambulatory Visit: Payer: Medicare Other

## 2022-11-17 ENCOUNTER — Ambulatory Visit: Payer: Medicare Other

## 2022-11-18 ENCOUNTER — Ambulatory Visit: Payer: Medicare Other | Admitting: Podiatry

## 2022-11-25 DEATH — deceased
